# Patient Record
Sex: Female | Born: 1935
Health system: Southern US, Community
[De-identification: ages and names within clinical notes are randomized; demographics above are authoritative.]

## PROBLEM LIST (undated history)

## (undated) DIAGNOSIS — I341 Nonrheumatic mitral (valve) prolapse: Secondary | ICD-10-CM

## (undated) DIAGNOSIS — J449 Chronic obstructive pulmonary disease, unspecified: Secondary | ICD-10-CM

## (undated) DIAGNOSIS — Z5189 Encounter for other specified aftercare: Secondary | ICD-10-CM

## (undated) DIAGNOSIS — A048 Other specified bacterial intestinal infections: Secondary | ICD-10-CM

## (undated) DIAGNOSIS — I4891 Unspecified atrial fibrillation: Principal | ICD-10-CM

## (undated) DIAGNOSIS — M48061 Spinal stenosis, lumbar region without neurogenic claudication: Secondary | ICD-10-CM

## (undated) DIAGNOSIS — I471 Supraventricular tachycardia, unspecified: Secondary | ICD-10-CM

## (undated) DIAGNOSIS — D72819 Decreased white blood cell count, unspecified: Secondary | ICD-10-CM

## (undated) DIAGNOSIS — Z86010 Personal history of colonic polyps: Secondary | ICD-10-CM

## (undated) DIAGNOSIS — Z9289 Personal history of other medical treatment: Secondary | ICD-10-CM

## (undated) DIAGNOSIS — R7309 Other abnormal glucose: Secondary | ICD-10-CM

## (undated) DIAGNOSIS — K922 Gastrointestinal hemorrhage, unspecified: Secondary | ICD-10-CM

## (undated) DIAGNOSIS — IMO0001 Reserved for inherently not codable concepts without codable children: Secondary | ICD-10-CM

## (undated) DIAGNOSIS — M1990 Unspecified osteoarthritis, unspecified site: Secondary | ICD-10-CM

## (undated) DIAGNOSIS — D509 Iron deficiency anemia, unspecified: Secondary | ICD-10-CM

## (undated) DIAGNOSIS — I2510 Atherosclerotic heart disease of native coronary artery without angina pectoris: Secondary | ICD-10-CM

## (undated) DIAGNOSIS — I10 Essential (primary) hypertension: Secondary | ICD-10-CM

## (undated) DIAGNOSIS — K219 Gastro-esophageal reflux disease without esophagitis: Secondary | ICD-10-CM

## (undated) DIAGNOSIS — E785 Hyperlipidemia, unspecified: Secondary | ICD-10-CM

## (undated) DIAGNOSIS — I214 Non-ST elevation (NSTEMI) myocardial infarction: Secondary | ICD-10-CM

## (undated) DIAGNOSIS — M810 Age-related osteoporosis without current pathological fracture: Secondary | ICD-10-CM

## (undated) HISTORY — DX: Decreased white blood cell count, unspecified: D72.819

## (undated) HISTORY — PX: TUBAL LIGATION: SHX77

## (undated) HISTORY — PX: TONSILLECTOMY: SUR1361

## (undated) HISTORY — DX: Age-related osteoporosis without current pathological fracture: M81.0

## (undated) HISTORY — DX: Chronic obstructive pulmonary disease, unspecified: J44.9

## (undated) HISTORY — DX: Supraventricular tachycardia: I47.1

## (undated) HISTORY — DX: Iron deficiency anemia, unspecified: D50.9

## (undated) HISTORY — DX: Spinal stenosis, lumbar region without neurogenic claudication: M48.061

## (undated) HISTORY — DX: Personal history of colonic polyps: Z86.010

## (undated) HISTORY — PX: OTHER SURGICAL HISTORY: SHX169

## (undated) HISTORY — DX: Essential (primary) hypertension: I10

## (undated) HISTORY — DX: Nonrheumatic mitral (valve) prolapse: I34.1

## (undated) HISTORY — DX: Other specified bacterial intestinal infections: A04.8

## (undated) HISTORY — DX: Hyperlipidemia, unspecified: E78.5

## (undated) HISTORY — PX: CATARACT EXTRACTION: SUR2

## (undated) HISTORY — PX: ABDOMINAL HYSTERECTOMY: SHX81

## (undated) HISTORY — DX: Other abnormal glucose: R73.09

## (undated) HISTORY — DX: Encounter for other specified aftercare: Z51.89

## (undated) HISTORY — DX: Reserved for inherently not codable concepts without codable children: IMO0001

## (undated) HISTORY — DX: Supraventricular tachycardia, unspecified: I47.10

## (undated) HISTORY — DX: Unspecified osteoarthritis, unspecified site: M19.90

---

## 1990-03-31 HISTORY — PX: CARDIAC CATHETERIZATION: SHX172

## 1996-10-29 DIAGNOSIS — A048 Other specified bacterial intestinal infections: Secondary | ICD-10-CM

## 1996-10-29 HISTORY — DX: Other specified bacterial intestinal infections: A04.8

## 1998-04-18 ENCOUNTER — Emergency Department (HOSPITAL_COMMUNITY): Admission: EM | Admit: 1998-04-18 | Discharge: 1998-04-18 | Payer: Self-pay | Admitting: *Deleted

## 1998-05-10 ENCOUNTER — Ambulatory Visit (HOSPITAL_COMMUNITY): Admission: RE | Admit: 1998-05-10 | Discharge: 1998-05-10 | Payer: Self-pay | Admitting: Endocrinology

## 1998-05-10 ENCOUNTER — Encounter: Payer: Self-pay | Admitting: Endocrinology

## 1999-04-25 ENCOUNTER — Other Ambulatory Visit: Admission: RE | Admit: 1999-04-25 | Discharge: 1999-04-25 | Payer: Self-pay | Admitting: Obstetrics and Gynecology

## 2000-02-11 ENCOUNTER — Other Ambulatory Visit: Admission: RE | Admit: 2000-02-11 | Discharge: 2000-02-11 | Payer: Self-pay | Admitting: Obstetrics and Gynecology

## 2000-02-24 ENCOUNTER — Encounter: Payer: Self-pay | Admitting: Obstetrics and Gynecology

## 2000-02-24 ENCOUNTER — Encounter: Admission: RE | Admit: 2000-02-24 | Discharge: 2000-02-24 | Payer: Self-pay | Admitting: Obstetrics and Gynecology

## 2000-12-18 ENCOUNTER — Encounter: Payer: Self-pay | Admitting: Emergency Medicine

## 2000-12-18 ENCOUNTER — Emergency Department (HOSPITAL_COMMUNITY): Admission: EM | Admit: 2000-12-18 | Discharge: 2000-12-18 | Payer: Self-pay | Admitting: Emergency Medicine

## 2001-03-03 ENCOUNTER — Other Ambulatory Visit: Admission: RE | Admit: 2001-03-03 | Discharge: 2001-03-03 | Payer: Self-pay | Admitting: Obstetrics and Gynecology

## 2001-03-16 ENCOUNTER — Encounter: Admission: RE | Admit: 2001-03-16 | Discharge: 2001-03-16 | Payer: Self-pay | Admitting: Obstetrics and Gynecology

## 2001-03-16 ENCOUNTER — Encounter: Payer: Self-pay | Admitting: Obstetrics and Gynecology

## 2001-07-29 ENCOUNTER — Ambulatory Visit (HOSPITAL_COMMUNITY): Admission: RE | Admit: 2001-07-29 | Discharge: 2001-07-29 | Payer: Self-pay | Admitting: Endocrinology

## 2001-07-29 ENCOUNTER — Encounter: Payer: Self-pay | Admitting: Endocrinology

## 2002-03-10 ENCOUNTER — Encounter: Payer: Self-pay | Admitting: Obstetrics and Gynecology

## 2002-03-10 ENCOUNTER — Encounter: Admission: RE | Admit: 2002-03-10 | Discharge: 2002-03-10 | Payer: Self-pay | Admitting: Obstetrics and Gynecology

## 2002-03-21 ENCOUNTER — Encounter: Payer: Self-pay | Admitting: Obstetrics and Gynecology

## 2002-03-21 ENCOUNTER — Encounter: Admission: RE | Admit: 2002-03-21 | Discharge: 2002-03-21 | Payer: Self-pay | Admitting: Obstetrics and Gynecology

## 2003-03-01 LAB — HM DEXA SCAN

## 2003-04-10 ENCOUNTER — Encounter: Admission: RE | Admit: 2003-04-10 | Discharge: 2003-04-10 | Payer: Self-pay | Admitting: Obstetrics and Gynecology

## 2003-05-24 ENCOUNTER — Emergency Department (HOSPITAL_COMMUNITY): Admission: EM | Admit: 2003-05-24 | Discharge: 2003-05-24 | Payer: Self-pay | Admitting: Emergency Medicine

## 2003-08-29 HISTORY — PX: OTHER SURGICAL HISTORY: SHX169

## 2004-02-02 ENCOUNTER — Ambulatory Visit: Payer: Self-pay | Admitting: Gastroenterology

## 2004-02-02 LAB — HM COLONOSCOPY

## 2004-02-27 ENCOUNTER — Ambulatory Visit: Payer: Self-pay | Admitting: Endocrinology

## 2004-03-07 ENCOUNTER — Ambulatory Visit: Payer: Self-pay | Admitting: Endocrinology

## 2004-07-02 ENCOUNTER — Encounter: Admission: RE | Admit: 2004-07-02 | Discharge: 2004-07-02 | Payer: Self-pay | Admitting: Obstetrics and Gynecology

## 2004-07-23 ENCOUNTER — Other Ambulatory Visit: Admission: RE | Admit: 2004-07-23 | Discharge: 2004-07-23 | Payer: Self-pay | Admitting: Obstetrics and Gynecology

## 2004-07-24 ENCOUNTER — Ambulatory Visit: Payer: Self-pay | Admitting: Endocrinology

## 2004-08-14 ENCOUNTER — Emergency Department (HOSPITAL_COMMUNITY): Admission: EM | Admit: 2004-08-14 | Discharge: 2004-08-14 | Payer: Self-pay | Admitting: Emergency Medicine

## 2004-08-15 ENCOUNTER — Ambulatory Visit: Payer: Self-pay | Admitting: Endocrinology

## 2004-08-28 ENCOUNTER — Ambulatory Visit: Payer: Self-pay

## 2004-09-17 ENCOUNTER — Ambulatory Visit: Payer: Self-pay | Admitting: Endocrinology

## 2005-04-23 ENCOUNTER — Ambulatory Visit: Payer: Self-pay | Admitting: Endocrinology

## 2005-04-30 ENCOUNTER — Ambulatory Visit: Payer: Self-pay | Admitting: Endocrinology

## 2005-05-26 ENCOUNTER — Ambulatory Visit: Payer: Self-pay | Admitting: Endocrinology

## 2005-07-03 ENCOUNTER — Encounter: Admission: RE | Admit: 2005-07-03 | Discharge: 2005-07-03 | Payer: Self-pay | Admitting: Obstetrics and Gynecology

## 2005-11-21 ENCOUNTER — Ambulatory Visit: Payer: Self-pay | Admitting: Endocrinology

## 2005-12-25 ENCOUNTER — Ambulatory Visit: Payer: Self-pay | Admitting: Internal Medicine

## 2005-12-31 ENCOUNTER — Ambulatory Visit: Payer: Self-pay | Admitting: Internal Medicine

## 2006-01-12 ENCOUNTER — Ambulatory Visit: Payer: Self-pay | Admitting: Endocrinology

## 2006-06-04 ENCOUNTER — Ambulatory Visit: Payer: Self-pay | Admitting: Endocrinology

## 2006-06-04 LAB — CONVERTED CEMR LAB
ALT: 15 units/L (ref 0–40)
Albumin: 4.3 g/dL (ref 3.5–5.2)
BUN: 8 mg/dL (ref 6–23)
Bacteria, UA: NEGATIVE
Basophils Absolute: 0 10*3/uL (ref 0.0–0.1)
Basophils Relative: 0.8 % (ref 0.0–1.0)
Bilirubin Urine: NEGATIVE
Cholesterol: 306 mg/dL (ref 0–200)
Creatinine, Ser: 0.9 mg/dL (ref 0.4–1.2)
Eosinophils Absolute: 0.2 10*3/uL (ref 0.0–0.6)
Eosinophils Relative: 5.8 % — ABNORMAL HIGH (ref 0.0–5.0)
GFR calc Af Amer: 80 mL/min
Glucose, Bld: 104 mg/dL — ABNORMAL HIGH (ref 70–99)
HDL: 60.2 mg/dL (ref >39.0)
Iron: 71 ug/dL (ref 42–145)
MCV: 97 fL (ref 78.0–100.0)
Monocytes Absolute: 0.4 10*3/uL (ref 0.2–0.7)
Mucus, UA: NEGATIVE
Neutrophils Relative %: 31.5 % — ABNORMAL LOW (ref 43.0–77.0)
Nitrite: NEGATIVE
Platelets: 267 10*3/uL (ref 150–400)
RBC: 3.87 M/uL (ref 3.87–5.11)
RDW: 11.6 % (ref 11.5–14.6)
Specific Gravity, Urine: 1.01 (ref 1.000–1.03)
Total Bilirubin: 0.9 mg/dL (ref 0.3–1.2)
Total Protein: 7.3 g/dL (ref 6.0–8.3)
Triglycerides: 126 mg/dL (ref 0–149)
Urobilinogen, UA: 0.2 (ref 0.0–1.0)
WBC: 3.6 10*3/uL — ABNORMAL LOW (ref 4.5–10.5)

## 2006-07-08 ENCOUNTER — Encounter: Admission: RE | Admit: 2006-07-08 | Discharge: 2006-07-08 | Payer: Self-pay | Admitting: Obstetrics and Gynecology

## 2006-11-12 ENCOUNTER — Encounter: Payer: Self-pay | Admitting: Endocrinology

## 2006-11-12 DIAGNOSIS — I10 Essential (primary) hypertension: Secondary | ICD-10-CM | POA: Insufficient documentation

## 2006-11-12 DIAGNOSIS — M810 Age-related osteoporosis without current pathological fracture: Secondary | ICD-10-CM | POA: Insufficient documentation

## 2006-11-12 HISTORY — DX: Essential (primary) hypertension: I10

## 2006-11-12 HISTORY — DX: Age-related osteoporosis without current pathological fracture: M81.0

## 2006-11-13 ENCOUNTER — Ambulatory Visit: Payer: Self-pay | Admitting: Endocrinology

## 2006-11-26 ENCOUNTER — Ambulatory Visit: Payer: Self-pay | Admitting: Internal Medicine

## 2006-12-23 ENCOUNTER — Ambulatory Visit: Payer: Self-pay | Admitting: Internal Medicine

## 2007-01-18 DIAGNOSIS — J45909 Unspecified asthma, uncomplicated: Secondary | ICD-10-CM

## 2007-01-18 DIAGNOSIS — R05 Cough: Secondary | ICD-10-CM

## 2007-03-08 ENCOUNTER — Telehealth (INDEPENDENT_AMBULATORY_CARE_PROVIDER_SITE_OTHER): Payer: Self-pay | Admitting: *Deleted

## 2007-03-09 ENCOUNTER — Ambulatory Visit: Payer: Self-pay | Admitting: Internal Medicine

## 2007-03-09 DIAGNOSIS — Z860100 Personal history of colon polyps, unspecified: Secondary | ICD-10-CM

## 2007-03-09 DIAGNOSIS — J449 Chronic obstructive pulmonary disease, unspecified: Secondary | ICD-10-CM

## 2007-03-09 DIAGNOSIS — I059 Rheumatic mitral valve disease, unspecified: Secondary | ICD-10-CM | POA: Insufficient documentation

## 2007-03-09 DIAGNOSIS — J019 Acute sinusitis, unspecified: Secondary | ICD-10-CM

## 2007-03-09 DIAGNOSIS — D509 Iron deficiency anemia, unspecified: Secondary | ICD-10-CM | POA: Insufficient documentation

## 2007-03-09 DIAGNOSIS — J4489 Other specified chronic obstructive pulmonary disease: Secondary | ICD-10-CM

## 2007-03-09 DIAGNOSIS — J309 Allergic rhinitis, unspecified: Secondary | ICD-10-CM

## 2007-03-09 DIAGNOSIS — E785 Hyperlipidemia, unspecified: Secondary | ICD-10-CM | POA: Insufficient documentation

## 2007-03-09 HISTORY — DX: Chronic obstructive pulmonary disease, unspecified: J44.9

## 2007-03-09 HISTORY — DX: Other specified chronic obstructive pulmonary disease: J44.89

## 2007-03-09 HISTORY — DX: Personal history of colon polyps, unspecified: Z86.0100

## 2007-03-09 HISTORY — DX: Personal history of colonic polyps: Z86.010

## 2007-05-12 ENCOUNTER — Ambulatory Visit: Payer: Self-pay | Admitting: Internal Medicine

## 2007-05-12 DIAGNOSIS — H0500 Unspecified acute inflammation of orbit: Secondary | ICD-10-CM

## 2007-06-15 ENCOUNTER — Telehealth (INDEPENDENT_AMBULATORY_CARE_PROVIDER_SITE_OTHER): Payer: Self-pay | Admitting: *Deleted

## 2007-07-02 ENCOUNTER — Ambulatory Visit: Payer: Self-pay | Admitting: Endocrinology

## 2007-07-02 DIAGNOSIS — R21 Rash and other nonspecific skin eruption: Secondary | ICD-10-CM

## 2007-07-02 DIAGNOSIS — R7309 Other abnormal glucose: Secondary | ICD-10-CM

## 2007-07-02 DIAGNOSIS — R739 Hyperglycemia, unspecified: Secondary | ICD-10-CM

## 2007-07-02 HISTORY — DX: Other abnormal glucose: R73.09

## 2007-07-13 ENCOUNTER — Telehealth: Payer: Self-pay | Admitting: Internal Medicine

## 2007-07-14 ENCOUNTER — Ambulatory Visit: Payer: Self-pay | Admitting: Internal Medicine

## 2007-07-16 LAB — CONVERTED CEMR LAB
Direct LDL: 204.6 mg/dL
HDL: 70.9 mg/dL (ref >39.0)
VLDL: 15 mg/dL (ref 0–40)

## 2007-07-20 ENCOUNTER — Encounter: Admission: RE | Admit: 2007-07-20 | Discharge: 2007-07-20 | Payer: Self-pay | Admitting: Obstetrics and Gynecology

## 2008-01-07 ENCOUNTER — Ambulatory Visit: Payer: Self-pay | Admitting: Endocrinology

## 2008-01-11 ENCOUNTER — Telehealth (INDEPENDENT_AMBULATORY_CARE_PROVIDER_SITE_OTHER): Payer: Self-pay | Admitting: *Deleted

## 2008-02-07 ENCOUNTER — Ambulatory Visit: Payer: Self-pay | Admitting: Endocrinology

## 2008-02-29 ENCOUNTER — Telehealth (INDEPENDENT_AMBULATORY_CARE_PROVIDER_SITE_OTHER): Payer: Self-pay | Admitting: *Deleted

## 2008-05-02 ENCOUNTER — Encounter: Payer: Self-pay | Admitting: Endocrinology

## 2008-05-25 ENCOUNTER — Encounter: Payer: Self-pay | Admitting: Endocrinology

## 2008-05-30 ENCOUNTER — Ambulatory Visit: Payer: Self-pay | Admitting: Endocrinology

## 2008-05-30 DIAGNOSIS — D72819 Decreased white blood cell count, unspecified: Secondary | ICD-10-CM | POA: Insufficient documentation

## 2008-05-30 HISTORY — DX: Decreased white blood cell count, unspecified: D72.819

## 2008-07-17 ENCOUNTER — Ambulatory Visit: Payer: Self-pay | Admitting: Endocrinology

## 2008-07-17 DIAGNOSIS — Z780 Asymptomatic menopausal state: Secondary | ICD-10-CM | POA: Insufficient documentation

## 2008-07-18 ENCOUNTER — Ambulatory Visit: Payer: Self-pay | Admitting: Internal Medicine

## 2008-07-18 ENCOUNTER — Encounter: Payer: Self-pay | Admitting: Endocrinology

## 2008-08-11 ENCOUNTER — Ambulatory Visit: Payer: Self-pay | Admitting: Endocrinology

## 2008-08-23 ENCOUNTER — Ambulatory Visit: Payer: Self-pay | Admitting: Internal Medicine

## 2009-01-24 ENCOUNTER — Encounter (INDEPENDENT_AMBULATORY_CARE_PROVIDER_SITE_OTHER): Payer: Self-pay | Admitting: *Deleted

## 2009-02-05 ENCOUNTER — Ambulatory Visit: Payer: Self-pay | Admitting: Endocrinology

## 2009-02-19 ENCOUNTER — Ambulatory Visit: Payer: Self-pay | Admitting: Endocrinology

## 2009-02-19 DIAGNOSIS — M1990 Unspecified osteoarthritis, unspecified site: Secondary | ICD-10-CM

## 2009-02-19 HISTORY — DX: Unspecified osteoarthritis, unspecified site: M19.90

## 2009-02-20 LAB — CONVERTED CEMR LAB
AST: 23 units/L (ref 0–37)
Albumin: 4.3 g/dL (ref 3.5–5.2)
Alkaline Phosphatase: 54 units/L (ref 39–117)
Bilirubin, Direct: 0.1 mg/dL (ref 0.0–0.3)
Total Protein: 7 g/dL (ref 6.0–8.3)

## 2009-03-19 ENCOUNTER — Ambulatory Visit: Payer: Self-pay | Admitting: Endocrinology

## 2009-04-03 ENCOUNTER — Telehealth: Payer: Self-pay | Admitting: Endocrinology

## 2009-05-02 ENCOUNTER — Encounter: Payer: Self-pay | Admitting: Endocrinology

## 2009-05-03 ENCOUNTER — Ambulatory Visit: Payer: Self-pay | Admitting: Cardiovascular Disease

## 2009-05-03 DIAGNOSIS — I471 Supraventricular tachycardia: Secondary | ICD-10-CM

## 2009-05-18 ENCOUNTER — Telehealth: Payer: Self-pay | Admitting: Endocrinology

## 2009-05-21 ENCOUNTER — Ambulatory Visit: Payer: Self-pay

## 2009-05-21 ENCOUNTER — Ambulatory Visit: Payer: Self-pay | Admitting: Cardiovascular Disease

## 2009-05-21 ENCOUNTER — Encounter: Payer: Self-pay | Admitting: Cardiovascular Disease

## 2009-05-21 ENCOUNTER — Ambulatory Visit (HOSPITAL_COMMUNITY)
Admission: RE | Admit: 2009-05-21 | Discharge: 2009-05-21 | Payer: Self-pay | Source: Home / Self Care | Admitting: Cardiovascular Disease

## 2009-09-17 ENCOUNTER — Ambulatory Visit: Payer: Self-pay | Admitting: Endocrinology

## 2009-09-17 DIAGNOSIS — E7800 Pure hypercholesterolemia, unspecified: Secondary | ICD-10-CM

## 2009-09-17 LAB — CONVERTED CEMR LAB: VLDL: 17.8 mg/dL (ref 0.0–40.0)

## 2009-09-18 ENCOUNTER — Encounter: Payer: Self-pay | Admitting: Endocrinology

## 2009-09-18 LAB — CONVERTED CEMR LAB
Calcium, Total (PTH): 10 mg/dL (ref 8.4–10.5)
PTH: 46.7 pg/mL (ref 14.0–72.0)

## 2009-10-16 ENCOUNTER — Telehealth: Payer: Self-pay | Admitting: Gastroenterology

## 2009-11-20 ENCOUNTER — Ambulatory Visit: Payer: Self-pay | Admitting: Endocrinology

## 2009-11-20 LAB — CONVERTED CEMR LAB
BUN: 10 mg/dL (ref 6–23)
CO2: 33 meq/L — ABNORMAL HIGH (ref 19–32)
Calcium: 9.8 mg/dL (ref 8.4–10.5)
Creatinine, Ser: 0.8 mg/dL (ref 0.4–1.2)
Eosinophils Absolute: 0.2 10*3/uL (ref 0.0–0.7)
Eosinophils Relative: 5 % (ref 0.0–5.0)
GFR calc non Af Amer: 95.57 mL/min (ref >60)
Hemoglobin: 12.8 g/dL (ref 12.0–15.0)
Neutro Abs: 1.4 10*3/uL (ref 1.4–7.7)
Platelets: 204 10*3/uL (ref 150.0–400.0)
RBC: 3.64 M/uL — ABNORMAL LOW (ref 3.87–5.11)
Sodium: 145 meq/L (ref 135–145)
Transferrin: 308.3 mg/dL (ref 212.0–360.0)
WBC: 3.7 10*3/uL — ABNORMAL LOW (ref 4.5–10.5)

## 2010-01-10 ENCOUNTER — Ambulatory Visit: Payer: Self-pay | Admitting: Endocrinology

## 2010-01-10 DIAGNOSIS — S9030XA Contusion of unspecified foot, initial encounter: Secondary | ICD-10-CM | POA: Insufficient documentation

## 2010-01-11 ENCOUNTER — Telehealth: Payer: Self-pay | Admitting: Endocrinology

## 2010-01-14 ENCOUNTER — Telehealth: Payer: Self-pay | Admitting: Endocrinology

## 2010-02-05 ENCOUNTER — Telehealth: Payer: Self-pay | Admitting: Endocrinology

## 2010-02-18 ENCOUNTER — Ambulatory Visit: Payer: Self-pay | Admitting: Endocrinology

## 2010-04-28 LAB — CONVERTED CEMR LAB
Albumin: 4.1 g/dL (ref 3.5–5.2)
Albumin: 4.4 g/dL (ref 3.5–5.2)
Alkaline Phosphatase: 44 units/L (ref 39–117)
BUN: 11 mg/dL (ref 6–23)
Bacteria, UA: NEGATIVE
Bilirubin Urine: NEGATIVE
CO2: 31 meq/L (ref 19–32)
Calcium: 9.5 mg/dL (ref 8.4–10.5)
Chloride: 101 meq/L (ref 96–112)
Cholesterol: 300 mg/dL — ABNORMAL HIGH (ref 0–200)
Creatinine, Ser: 0.9 mg/dL (ref 0.4–1.2)
Crystals: NEGATIVE
Direct LDL: 177.7 mg/dL
GFR calc non Af Amer: 66 mL/min
Glucose, Bld: 99 mg/dL (ref 70–99)
Glucose, Bld: 99 mg/dL (ref 70–99)
HCT: 38.1 % (ref 36.0–46.0)
HDL: 92.6 mg/dL (ref >39.00)
Hgb A1c MFr Bld: 5.8 % (ref 4.6–6.5)
Ketones, ur: NEGATIVE mg/dL
Ketones, ur: NEGATIVE mg/dL
Leukocytes, UA: NEGATIVE
MCV: 99.8 fL (ref 78.0–100.0)
Neutrophils Relative %: 28.4 % — ABNORMAL LOW (ref 43.0–77.0)
Nitrite: NEGATIVE
Platelets: 231 10*3/uL (ref 150–400)
Potassium: 3.9 meq/L (ref 3.5–5.1)
RBC: 3.82 M/uL — ABNORMAL LOW (ref 3.87–5.11)
RDW: 12.1 % (ref 11.5–14.6)
Sodium: 142 meq/L (ref 135–145)
Specific Gravity, Urine: <=1.005 (ref 1.000–1.03)
Specific Gravity, Urine: <=1.005 (ref 1.000–1.030)
Total Bilirubin: 0.8 mg/dL (ref 0.3–1.2)
Total CHOL/HDL Ratio: 3
Urine Glucose: NEGATIVE mg/dL
Urine Glucose: NEGATIVE mg/dL
WBC: 4.1 10*3/uL — ABNORMAL LOW (ref 4.5–10.5)
pH: 6.5 (ref 5.0–8.0)
pH: 7.5 (ref 5.0–8.0)

## 2010-05-02 NOTE — Letter (Signed)
Summary: [place]   Imported By: Phillis Knack 05/23/2009 11:16:02  _____________________________________________________________________  External Attachment:    Type:   Image     Comment:   External Document

## 2010-05-02 NOTE — Progress Notes (Signed)
Summary: refill req  Phone Note Refill Request Message from:  Patient on January 11, 2010 10:32 AM  Refills Requested: Medication #1:  HYZAAR 50-12.5 MG  TABS take 1 by mouth qd   Dosage confirmed as above?Dosage Confirmed  Method Requested: Electronic Initial call taken by: Rebeca Alert MA,  January 11, 2010 10:32 AM    Prescriptions: HYZAAR 50-12.5 MG  TABS (LOSARTAN POTASSIUM-HCTZ) take 1 by mouth qd  #30 Tablet x 4   Entered by:   Rebeca Alert MA   Authorized by:   Donavan Foil MD   Signed by:   Rebeca Alert MA on 01/11/2010   Method used:   Electronically to        [facility]. [id number]* (retail)       [address].       [place], [place]  [address]       Ph: [phone] or [phone]       Fax: [phone]   RxID:   [id number]

## 2010-05-02 NOTE — Progress Notes (Signed)
Summary: Schedule Colonoscopy  Phone Note Outgoing Call Call back at Home Phone [phone]   Call placed by: Bernita Buffy CMA [facility]),  October 16, 2009 9:44 AM Call placed to: Patient Summary of Call: spoke to patient husband and gave him all of the information about the colonoscopy and he will have his wife call back to schedule her colonoscopy when she gets home later today.  Initial call taken by: Bernita Buffy CMA [facility]),  October 16, 2009 9:48 AM

## 2010-05-02 NOTE — Assessment & Plan Note (Signed)
Summary: np3/MVP/jml  notes in emr    Visit Type:  Follow-up  CC:  Mitral Valve Prolapse.  History of Present Illness: Lindsay Doyle is seen today at the request of Dr Loanne Drilling for MVP, history of SVT and HTN.  She is originally from [place].  She has apparantly had a history of SVT although the details are unknown.  One episode was in the setting of profound anemia from "female problems".  She denies current palpitations.  She has a long standing history of MVP ans says her last echo was 3-5 years ago in [place].  She was upset when her dentist didn't advise SBE prophylaxis.  I informed her of the new guidelines and assured her that the dentist was doing the correct thing.  She denies dyspnea, edema, SSCP or syncope  She has been compliant wtih her BP and cholesterol meds.  She is active.  She doens't smoke.  Current Problems (verified): 1)  Hyperlipidemia  (ICD-272.4) 2)  Mitral Valve Prolapse  (ICD-424.0) 3)  Hypertension  (ICD-401.9) 4)  Hyperglycemia  (ICD-790.29) 5)  Routine General Medical Exam@health  Care Facl  (ICD-V70.0) 6)  Unspecified Acute Inflammation of Orbit  (ICD-376.00) 7)  Sinusitis- Acute-nos  (ICD-461.9) 8)  Colonic Polyps, Hx of  (ICD-V12.72) 9)  Anemia-iron Deficiency  (ICD-280.9) 10)  Allergic Rhinitis  (ICD-477.9) 11)  COPD  (ICD-496) 12)  Hx of Cough  (ICD-786.2) 13)  Asthma  (ICD-493.90) 14)  Osteoporosis  (ICD-733.00) 15)  Osteoarthritis  (ICD-715.90) 16)  Asymptomatic Postmenopausal Status  (ICD-V49.81) 17)  Hepatotoxicity, Drug-induced, Risk of  (ICD-V58.69) 18)  Leukopenia, Mild  (ICD-288.50) 19)  Hip Pain, Left  (ICD-719.45) 20)  Skin Rash  (ICD-782.1)  Current Medications (verified): 1)  Multivitamins   Tabs (Multiple Vitamin) .... Take 1 By Mouth Qd 2)  Oscal 500/200 D-3 500-200 Mg-Unit  Tabs (Calcium-Vitamin D) .[name].. 1 By Mouth Tid 3)  B Complex   Tabs (B Complex Vitamins) .... Take 1 By Mouth Qd 4)  Hyzaar 50-12.5 Mg  Tabs (Losartan Potassium-Hctz) ....  Take 1 By Mouth Qd 5)  Metoprolol Tartrate 25 Mg  Tabs (Metoprolol Tartrate) .... Take 1 By Mouth Qd 6)  Cvs Natural Fish Oil 1000 Mg Caps (Omega-3 Fatty Acids) .[name].. 1 Capsule Daily  Allergies: 1)  * Vioxx 2)  Biaxin 3)  Augmentin 4)  * Tussionex 5)  Zithromax 6)  Ceftin  Past History:  Past Medical History: Last updated: 03/09/2007 Hypertension Osteoporosis Positive H.Pylori (10/1996) Decrease WBC, Chronic Spinal OA Dyslipidemia ( Rx refused) Hyperglycemia Asthma COPD Allergic rhinitis Mitral valve prolapse Hyperlipidemia Anemia-iron deficiency Colonic polyps, hx of  Past Surgical History: Last updated: 03/09/2007 Hysterectomy Tonsillectomy Carotid duplex (08/29/2003) EKG (06/04/2006) DEXA (03/2003) Tubal ligation Cataract extraction  Family History: Last updated: 03/09/2007 sister and neice with sarcoidosis mother with stomach cancer  brother with colon cancer brother with NHL  Social History: Last updated: 07/02/2007 Never Smoked Alcohol use-no retired  married  Review of Systems       Denies fever, malais, weight loss, blurry vision, decreased visual acuity, cough, sputum, SOB, hemoptysis, pleuritic pain, palpitaitons, heartburn, abdominal pain, melena, lower extremity edema, claudication, or rash. All other systems reviewed and negative  Vital Signs:  Patient profile:   77 year old female Height:      64 inches Weight:      106.50 pounds BMI:     18.35 Pulse rate:   84 / minute Pulse rhythm:   regular Resp:     18 per minute  BP supine:   110 / 49  Vitals Entered By: Sidney Ace (May 03, 2009 2:34 PM)  Physical Exam  General:  Affect appropriate Healthy:  appears stated age 18: normal Neck supple with no adenopathy JVP normal no bruits no thyromegaly Lungs clear with no wheezing and good diaphragmatic motion Heart:  S1/S2 no murmur,rub, gallop or click PMI normal Abdomen: benighn, BS positve, no tenderness, no AAA no bruit.   No HSM or HJR Distal pulses intact with no bruits No edema Neuro non-focal Skin warm and dry    Impression & Recommendations:  Problem # 1:  HYPERLIPIDEMIA (ICD-272.4) F/U with Loanne Drilling  ? previous Rx.  Target LDL less than 130 CHOL: 300 (07/17/2008)   LDL: DEL (07/14/2007)   HDL: 92.60 (07/17/2008)   TG: 91.0 (07/17/2008)  Problem # 2:  MITRAL VALVE PROLAPSE (ICD-424.0) Split first heart sound on exam.  No significant murmur.  Long standing history Echo Her updated medication list for this problem includes:    Hyzaar 50-12.5 Mg Tabs (Losartan potassium-hctz) .[name]... Take 1 by mouth qd    Metoprolol Tartrate 25 Mg Tabs (Metoprolol tartrate) .[name]... Take 1 by mouth qd  Orders: EKG w/ Interpretation (93000) Echocardiogram (Echo)  Problem # 3:  HYPERTENSION (ICD-401.9) Well cotnrolled Continue low soidum diet Her updated medication list for this problem includes:    Hyzaar 50-12.5 Mg Tabs (Losartan potassium-hctz) .[name]... Take 1 by mouth qd    Metoprolol Tartrate 25 Mg Tabs (Metoprolol tartrate) .[name]... Take 1 by mouth qd  Problem # 4:  PSVT (ICD-427.0) In seting of anemia.  Non recurrent continue low dose BB in light of HTN Her updated medication list for this problem includes:    Metoprolol Tartrate 25 Mg Tabs (Metoprolol tartrate) .[name]... Take 1 by mouth qd  Patient Instructions: 1)  Your physician recommends that you schedule a follow-up appointment as needed 2)  Your physician has requested that you have an echocardiogram.  Echocardiography is a painless test that uses sound waves to create images of your heart. It provides your doctor with information about the size and shape of your heart and how well your heart's chambers and valves are working.  This procedure takes approximately one hour. There are no restrictions for this procedure.   EKG Report  Procedure date:  05/03/2009  Findings:      NSR 84 Normal ECG

## 2010-05-02 NOTE — Progress Notes (Signed)
Summary: referral  Phone Note Call from Patient Call back at Home Phone [phone]   Caller: Patient Summary of Call: pt called requesting referral to [facility] cardiologist for MVP Initial call taken by: Crissie Sickles, [place],  April 03, 2009 10:26 AM  Follow-up for Phone Call        done Follow-up by: Donavan Foil MD,  April 03, 2009 11:41 AM  Additional Follow-up for Phone Call Additional follow up Details #1::        pt informed and told to expect call from [facility] with appt info Additional Follow-up by: Crissie Sickles, [place],  April 03, 2009 11:43 AM

## 2010-05-02 NOTE — Assessment & Plan Note (Signed)
Summary: BROKE LEFT LITTLE TOE AND STILL IN PAIN-LB   Vital Signs:  Patient profile:   77 year old female Height:      64 inches (162.56 cm) Weight:      106 pounds (48.18 kg) BMI:     18.26 O2 Sat:      98 % on Room air Temp:     97.6 degrees F (36.44 degrees C) oral Pulse rate:   85 / minute BP sitting:   122 / 82  (left arm) Cuff size:   regular  Vitals Entered By: Rebeca Alert MA (January 10, 2010 9:14 AM)  O2 Flow:  Room air CC: pt states she broke left little toe/aj Is Patient Diabetic? Yes   CC:  pt states she broke left little toe/aj.  History of Present Illness: 10 days ago, she struck her left foot on a door frame.  since then, she has 10 days of pain at the left distal foot, and adjacent toes, but no assoc numbness.  Current Medications (verified): 1)  Multivitamins   Tabs (Multiple Vitamin) .... Take 1 By Mouth Qd 2)  Oscal 500/200 D-3 500-200 Mg-Unit  Tabs (Calcium-Vitamin D) .[name].. 1 By Mouth Tid 3)  B Complex   Tabs (B Complex Vitamins) .... Take 1 By Mouth Qd 4)  Hyzaar 50-12.5 Mg  Tabs (Losartan Potassium-Hctz) .... Take 1 By Mouth Qd 5)  Cvs Natural Fish Oil 1000 Mg Caps (Omega-3 Fatty Acids) .[name].. 1 Capsule Daily 6)  Fluticasone Propionate 0.05 % Crea (Fluticasone Propionate) .... 4x A Day As Needed For Rash. 7)  Metoprolol Succinate 25 Mg Xr24h-Tab (Metoprolol Succinate) .[name].. 1 Tab Once Daily  Allergies (verified): 1)  * Vioxx 2)  Biaxin 3)  Augmentin 4)  * Tussionex 5)  Zithromax 6)  Ceftin  Past History:  Past Medical History: Last updated: 05/02/2009 Current Problems:  HYPERLIPIDEMIA (ICD-272.4) MITRAL VALVE PROLAPSE (ICD-424.0) HYPERTENSION (ICD-401.9) HYPERGLYCEMIA (ICD-790.29) ROUTINE GENERAL MEDICAL EXAM@HEALTH  CARE FACL (ICD-V70.0) UNSPECIFIED ACUTE INFLAMMATION OF ORBIT (ICD-376.00) SINUSITIS- ACUTE-NOS (ICD-461.9) COLONIC POLYPS, HX OF (ICD-V12.72) ANEMIA-IRON DEFICIENCY (ICD-280.9) ALLERGIC RHINITIS (ICD-477.9) COPD  (ICD-496) Hx of COUGH (ICD-786.2) ASTHMA (ICD-493.90) OSTEOPOROSIS (ICD-733.00) OSTEOARTHRITIS (ICD-715.90) ASYMPTOMATIC POSTMENOPAUSAL STATUS (ICD-V49.81) HEPATOTOXICITY, DRUG-INDUCED, RISK OF (ICD-V58.69) LEUKOPENIA, MILD (ICD-288.50) HIP PAIN, LEFT (ICD-719.45) SKIN RASH (ICD-782.1) Positive H.Pylori (10/1996) Decrease WBC, Chronic Spinal OA Dyslipidemia ( Rx refused) Mitral valve prolapse  Review of Systems  The patient denies syncope.         denies dizziness  Physical Exam  General:  normal appearance.   Msk:  gait:  slightly favors left foot Extremities:  left foot: there is tenderness at the 4th and 5th toes, and at adjacent area of the foot. Additional Exam:  LEFT FOOT - COMPLETE 3+ VIEW 01/10/2010: 1.  Fractures involving the base of the proximal phalanges of the fourth and fifth toes. 2.  Possible gout involving the first MTP joint. 3.  Osteoarthritis involving multiple IP joints and the midfoot. 4.  Mild osteopenia.   Impression & Recommendations:  Problem # 1:  CONTUSION, LEFT FOOT (ICD-924.20) Assessment New with several toe fxs  Problem # 2:  gout uncertain dx  Problem # 3:  OSTEOPOROSIS (ICD-733.00) Assessment: Unchanged  Other Orders: T-Foot Left Min 3 Views (73630TC) Est. Patient Level IV [id number])  Patient Instructions: 1)  take vimovo 500/20, 1 per day.  here are some samples. 2)  check x ray of your left foot.  please call [phone] to hear your test results. 3)  call in  a few days if not better. 4)  (update: i left message on phone-tree:  rx as we discussed.  we'll address gout at a future ov).

## 2010-05-02 NOTE — Assessment & Plan Note (Signed)
Summary: FU/NWS   Vital Signs:  Patient profile:   77 year old female Height:      64 inches (162.56 cm) Weight:      106 pounds (48.18 kg) BMI:     18.26 O2 Sat:      99 % on Room air Temp:     97.1 degrees F (36.17 degrees C) oral Pulse rate:   88 / minute BP sitting:   130 / 88  (left arm) Cuff size:   regular  Vitals Entered By: Rebeca Alert MA (November 20, 2009 10:11 AM)  O2 Flow:  Room air CC: F/U appt/discuss BP, lab work/aj   CC:  F/U appt/discuss BP and lab work/aj.  History of Present Illness: pt says she has lightheadedness a few hrs after taking toprol.  she brings a record of her bp's which i have reviewed today.  it is 99991111 systolic.     Current Medications (verified): 1)  Multivitamins   Tabs (Multiple Vitamin) .... Take 1 By Mouth Qd 2)  Oscal 500/200 D-3 500-200 Mg-Unit  Tabs (Calcium-Vitamin D) .[name].. 1 By Mouth Tid 3)  B Complex   Tabs (B Complex Vitamins) .... Take 1 By Mouth Qd 4)  Hyzaar 50-12.5 Mg  Tabs (Losartan Potassium-Hctz) .... Take 1 By Mouth Qd 5)  Metoprolol Tartrate 25 Mg  Tabs (Metoprolol Tartrate) .... Take 1 By Mouth Qd 6)  Cvs Natural Fish Oil 1000 Mg Caps (Omega-3 Fatty Acids) .[name].. 1 Capsule Daily 7)  Fluticasone Propionate 0.05 % Crea (Fluticasone Propionate) .... 4x A Day As Needed For Rash.  Allergies (verified): 1)  * Vioxx 2)  Biaxin 3)  Augmentin 4)  * Tussionex 5)  Zithromax 6)  Ceftin  Past History:  Past Medical History: Last updated: 05/02/2009 Current Problems:  HYPERLIPIDEMIA (ICD-272.4) MITRAL VALVE PROLAPSE (ICD-424.0) HYPERTENSION (ICD-401.9) HYPERGLYCEMIA (ICD-790.29) ROUTINE GENERAL MEDICAL EXAM@HEALTH  CARE FACL (ICD-V70.0) UNSPECIFIED ACUTE INFLAMMATION OF ORBIT (ICD-376.00) SINUSITIS- ACUTE-NOS (ICD-461.9) COLONIC POLYPS, HX OF (ICD-V12.72) ANEMIA-IRON DEFICIENCY (ICD-280.9) ALLERGIC RHINITIS (ICD-477.9) COPD (ICD-496) Hx of COUGH (ICD-786.2) ASTHMA (ICD-493.90) OSTEOPOROSIS  (ICD-733.00) OSTEOARTHRITIS (ICD-715.90) ASYMPTOMATIC POSTMENOPAUSAL STATUS (ICD-V49.81) HEPATOTOXICITY, DRUG-INDUCED, RISK OF (ICD-V58.69) LEUKOPENIA, MILD (ICD-288.50) HIP PAIN, LEFT (ICD-719.45) SKIN RASH (ICD-782.1) Positive H.Pylori (10/1996) Decrease WBC, Chronic Spinal OA Dyslipidemia ( Rx refused) Mitral valve prolapse  Review of Systems  The patient denies syncope.    Physical Exam  General:  normal appearance.   Msk:  gait is normal and steady    Impression & Recommendations:  Problem # 1:  HYPERTENSION (ICD-401.9) overcontrolled  Medications Added to Medication List This Visit: 1)  Metoprolol Succinate 25 Mg Xr24h-tab (Metoprolol succinate) .[name].. 1 tab once daily  Other Orders: TLB-CBC Platelet - w/Differential (85025-CBCD) TLB-BMP (Basic Metabolic Panel-BMET) (99991111) TLB-IBC Pnl (Iron/FE;Transferrin) (83550-IBC) Est. Patient Level III [id number])  Patient Instructions: 1)  change metoprolol to extended-release 25 mg once daily. 2)  blood tests are being ordered for you today.  please call [phone] to hear your test results. 3)  i'll fax results to dr Helane Rima. Prescriptions: METOPROLOL SUCCINATE 25 MG XR24H-TAB (METOPROLOL SUCCINATE) 1 tab once daily  #90 x 3   Entered and Authorized by:   Donavan Foil MD   Signed by:   Donavan Foil MD on 11/20/2009   Method used:   Electronically to        [facility]. [id number]* (retail)       [place].       [facility]  [place], [place]  [address]       Ph: [phone] or [phone]       Fax: [phone]   RxID:   [id number]

## 2010-05-02 NOTE — Progress Notes (Signed)
Summary: HCTZ?  Phone Note Call from Patient Call back at Home Phone [phone]   Caller: Patient [phone] Summary of Call: Pt called stating that she did some research over the weekend about Gout and read that Diuretics can cause it. Pt also says that she drinks 10-12 glasses of water a day and she had been going to the bathroom more frequently. Pt is requesting to remove or decrease HCTZ component of Hyzaar. Please advise. Initial call taken by: Crissie Sickles, [place],  January 14, 2010 9:28 AM  Follow-up for Phone Call        i changed to just losartan.  ret in a few weeks. Follow-up by: Donavan Foil MD,  January 14, 2010 12:13 PM  Additional Follow-up for Phone Call Additional follow up Details #1::        Pt informed via VM, told to schd ROV  Additional Follow-up by: Crissie Sickles, [place],  January 14, 2010 12:58 PM    New/Updated Medications: LOSARTAN POTASSIUM 100 MG TABS (LOSARTAN POTASSIUM) 1 tab once daily Prescriptions: LOSARTAN POTASSIUM 100 MG TABS (LOSARTAN POTASSIUM) 1 tab once daily  #30 x 1   Entered and Authorized by:   Donavan Foil MD   Signed by:   Donavan Foil MD on 01/14/2010   Method used:   Electronically to        [facility]. [id number]* (retail)       [place].       [place], [place]  [address]       Ph: [phone] or [phone]       Fax: [phone]   RxID:   [id number]

## 2010-05-02 NOTE — Progress Notes (Signed)
Summary: Xray?  Phone Note Call from Patient Call back at Home Phone [phone]   Caller: Patient Summary of Call: pt called requesting MD to review hardcopy of xray she had done at Dr. Jerl Santos office. I advised pt to request a copy of her OV, labs and/or xray be sent MD's attn for review. Once MD receives she can make appt to discuss. Pt staes thats he was not satified with treatment she received from Dr. Darlin Coco Initial call taken by: Crissie Sickles, [place],  May 18, 2009 2:36 PM

## 2010-05-02 NOTE — Progress Notes (Signed)
Summary: Med change req?  Phone Note Call from Patient Call back at Home Phone [phone]   Caller: Patient Summary of Call: Pt came into clinic today stating she would like  to go back to Hyzaar 50/12.5mg  instead of the more recent 100mg  Losartan. Pt says she did not realized that there was not a lower dose of the diuretic (Pt was advised when she requested the change) and doesn't like cutting tab in half as she has been doing. Please advise, okay to change back? Initial call taken by: Crissie Sickles, [place],  February 05, 2010 2:20 PM  Follow-up for Phone Call        taking just the losartan was in reponse to your inquiry about the gout.  i think just the losartan is a good idea. Follow-up by: Donavan Foil MD,  February 05, 2010 2:48 PM  Additional Follow-up for Phone Call Additional follow up Details #1::        left message on machine for pt to return my call. Crissie Sickles, CMA  February 06, 2010 4:24 PM   Pt informed of above and previous phone note, but still insists that she willnot take Losartan 100mg  and will only take Hyzaar 50/12.5. Pt is requesting this be updated on med list, please advise. Additional Follow-up by: Crissie Sickles, [place],  February 07, 2010 8:55 AM    Additional Follow-up for Phone Call Additional follow up Details #2::    i changed back Follow-up by: Donavan Foil MD,  February 07, 2010 9:25 AM  New/Updated Medications: LOSARTAN POTASSIUM-HCTZ 50-12.5 MG TABS (LOSARTAN POTASSIUM-HCTZ) 1 tab once daily Prescriptions: LOSARTAN POTASSIUM-HCTZ 50-12.5 MG TABS (LOSARTAN POTASSIUM-HCTZ) 1 tab once daily  #30 x 11   Entered and Authorized by:   Donavan Foil MD   Signed by:   Donavan Foil MD on 02/07/2010   Method used:   Electronically to        [facility]. [id number]* (retail)       [place].       [place], [place]  [address]       Ph: [phone] or [phone]       Fax: [phone]   RxID:    [id number]

## 2010-05-02 NOTE — Assessment & Plan Note (Signed)
Summary: flu shot-lb  Nurse Visit   Allergies: 1)  * Vioxx 2)  Biaxin 3)  Augmentin 4)  * Tussionex 5)  Zithromax 6)  Ceftin  Immunizations Administered:  Influenza Vaccine # 1:    Vaccine Type: Fluvax 3+    Site: left deltoid    Mfr: [place]    Dose: 0.5 ml    Route: IM    Given by: Rebeca Alert CMA ([place])    Exp. Date: 09/28/2010    Lot #: [id number]    VIS given: 2011  Orders Added: 1)  Flu Vaccine 10yrs + [id number] 2)  Admin 1st Vaccine [id number]

## 2010-05-02 NOTE — Assessment & Plan Note (Signed)
Summary: YEARLY FU/ MEDICARE / LABS AFTER /NWS   Vital Signs:  Patient profile:   77 year old female Weight:      106 pounds Temp:     97.4 degrees F Pulse rate:   74 / minute BP sitting:   140 / 90  (left arm) Cuff size:   regular  Vitals Entered By: Charlsie Quest, CMA (September 17, 2009 8:17 AM) CC: CPX - recent wkup from cardiology and dr deveshwar.    CC:  CPX - recent wkup from cardiology and dr deveshwar. .  History of Present Illness: here for regular wellness examination.  she's feeling pretty well in general, and does not drink or smoke.   Current Medications (verified): 1)  Multivitamins   Tabs (Multiple Vitamin) .... Take 1 By Mouth Qd 2)  Oscal 500/200 D-3 500-200 Mg-Unit  Tabs (Calcium-Vitamin D) .[name].. 1 By Mouth Tid 3)  B Complex   Tabs (B Complex Vitamins) .... Take 1 By Mouth Qd 4)  Hyzaar 50-12.5 Mg  Tabs (Losartan Potassium-Hctz) .... Take 1 By Mouth Qd 5)  Metoprolol Tartrate 25 Mg  Tabs (Metoprolol Tartrate) .... Take 1 By Mouth Qd 6)  Cvs Natural Fish Oil 1000 Mg Caps (Omega-3 Fatty Acids) .[name].. 1 Capsule Daily  Allergies (verified): 1)  * Vioxx 2)  Biaxin 3)  Augmentin 4)  * Tussionex 5)  Zithromax 6)  Ceftin  Family History: Reviewed history from 03/09/2007 and no changes required. sister and neice with sarcoidosis mother with stomach cancer  brother with colon cancer brother with NHL  Social History: Reviewed history from 07/02/2007 and no changes required. Never Smoked Alcohol use-no retired  married  Review of Systems  The patient denies weight loss, weight gain, vision loss, decreased hearing, chest pain, syncope, prolonged cough, headaches, abdominal pain, melena, hematochezia, severe indigestion/heartburn, hematuria, suspicious skin lesions, and depression.    Physical Exam  General:  normal appearance.   Head:  head: no deformity eyes: no periorbital swelling, no proptosis external nose and ears are normal mouth: no lesion  seen Neck:  Supple without thyroid enlargement or tenderness.  Breasts:  sees gyn  Lungs:  Clear to auscultation bilaterally. Normal respiratory effort.  Abdomen:  abdomen is soft, nontender.  no hepatosplenomegaly.   not distended.  no hernia  Rectal:  sees gyn  Genitalia:  sees gyn  Msk:  muscle bulk and strength are grossly normal.  no obvious joint swelling.  gait is normal and steady  Extremities:  no deformity.  no ulcer on the feet.  feet are of normal color and temp.  no edema  Neurologic:  cn 2-12 grossly intact.   readily moves all 4's.   sensation is intact to touch on the feet  Cervical Nodes:  No significant adenopathy.  Psych:  Alert and cooperative; normal mood and affect; normal attention span and concentration.   Additional Exam:  SEPARATE EVALUATION FOLLOWS--EACH PROBLEM HERE IS NEW, NOT RESPONDING TO TREATMENT, OR POSES SIGNIFICANT RISK TO THE PATIENT'S HEALTH: HISTORY OF THE PRESENT ILLNESS: 1 month of slight itching at the site of a tick bite at the right leg.  no assoc fever. PAST MEDICAL HISTORY reviewed and up to date today REVIEW OF SYSTEMS: denies sob PHYSICAL EXAMINATION: dorsalis pedis intact bilat.  no carotid bruit cv: reg rate and rhythm.  no murmur. minimal rash at the right posterior tibial area (5 mm) LAB/XRAY RESULTS: Cholesterol LDL 146.3 mg/dL IMPRESSION: rash, due to tick bite mild dyslipidemia PLAN: see instruction  sheet    Impression & Recommendations:  Problem # 1:  ROUTINE GENERAL MEDICAL EXAM@HEALTH  CARE FACL (ICD-V70.0)  Medications Added to Medication List This Visit: 1)  Fluticasone Propionate 0.05 % Crea (Fluticasone propionate) .... 4x a day as needed for rash.  Other Orders: T-Parathyroid Hormone, Intact w/ Calcium [id number]) TLB-Lipid Panel (80061-LIPID) TLB-TSH (Thyroid Stimulating Hormone) (84443-TSH) TLB-A1C / Hgb A1C (Glycohemoglobin) (83036-A1C) Est. Patient Level III [id number]) Est. Patient 65& >  [id number])  Preventive Care Screening  Bone Density:    Date:  07/18/2008    Results:  normal std dev     gyn is dr Helane Rima   Patient Instructions: 1)  triamcinolone cream 4x a day as needed for itching. 2)  please consider these measures for your health:  minimize alcohol.  do not use tobacco products.  have a colonoscopy at least every 10 years from age 72.  keep firearms safely stored.  always use seat belts.  have working smoke alarms in your home.  see the dentist regularly.  never drive under the influence of alcohol or drugs (including prescription drugs).   3)  please let me know what your wishes would be, if artificial life support measures should become necessary.  it is critically important to prevent falling down (keep floor areas well-lit, dry, and free of loose objects) 4)  Please schedule a follow-up appointment in 1 year. 5)  blood tests are being ordered for you today.  please call [phone] to hear your test results. 6)  addendum:  we discussed code status.  pt requests full code, but would not want to be started or maintained on artificial life-support measures if there was not a reasonable chance of recovery 7)  (update: i left message on phone-tree:  it is controversial as to whether you should take chol med in this situation.  let me know if you want rx). Prescriptions: FLUTICASONE PROPIONATE 0.05 % CREA (FLUTICASONE PROPIONATE) 4x a day as needed for rash.  #1 small tube x 0   Entered and Authorized by:   Donavan Foil MD   Signed by:   Donavan Foil MD on 09/17/2009   Method used:   Electronically to        [facility]. [id number]* (retail)       [place].       [place], [place]  [address]       Ph: [phone] or [phone]       Fax: [phone]   RxID:   [id number]    Preventive Care Screening  Bone Density:    Date:  07/18/2008    Results:  normal std dev     gyn is dr Helane Rima

## 2010-06-20 ENCOUNTER — Inpatient Hospital Stay (HOSPITAL_COMMUNITY)
Admission: EM | Admit: 2010-06-20 | Discharge: 2010-06-22 | DRG: 378 | Disposition: A | Discharge status: Home / Self Care | Payer: Medicare Other | Attending: Family Medicine | Admitting: Family Medicine

## 2010-06-20 DIAGNOSIS — J309 Allergic rhinitis, unspecified: Secondary | ICD-10-CM | POA: Diagnosis present

## 2010-06-20 DIAGNOSIS — K2970 Gastritis, unspecified, without bleeding: Secondary | ICD-10-CM | POA: Diagnosis present

## 2010-06-20 DIAGNOSIS — K2990 Gastroduodenitis, unspecified, without bleeding: Secondary | ICD-10-CM | POA: Diagnosis present

## 2010-06-20 DIAGNOSIS — K449 Diaphragmatic hernia without obstruction or gangrene: Secondary | ICD-10-CM | POA: Diagnosis present

## 2010-06-20 DIAGNOSIS — D649 Anemia, unspecified: Secondary | ICD-10-CM | POA: Diagnosis present

## 2010-06-20 DIAGNOSIS — D62 Acute posthemorrhagic anemia: Secondary | ICD-10-CM | POA: Diagnosis present

## 2010-06-20 DIAGNOSIS — K922 Gastrointestinal hemorrhage, unspecified: Principal | ICD-10-CM | POA: Diagnosis present

## 2010-06-20 DIAGNOSIS — E876 Hypokalemia: Secondary | ICD-10-CM | POA: Diagnosis present

## 2010-06-20 DIAGNOSIS — I10 Essential (primary) hypertension: Secondary | ICD-10-CM | POA: Diagnosis present

## 2010-06-20 LAB — POCT I-STAT, CHEM 8
Calcium, Ion: 1.14 mmol/L (ref 1.12–1.32)
Creatinine, Ser: 1.1 mg/dL (ref 0.4–1.2)
Potassium: 4.5 mEq/L (ref 3.5–5.1)

## 2010-06-20 LAB — PROTIME-INR: INR: 0.92 (ref 0.00–1.49)

## 2010-06-20 LAB — TYPE AND SCREEN
ABO/RH(D): B NEG
Antibody Screen: NEGATIVE

## 2010-06-21 ENCOUNTER — Encounter: Payer: Self-pay | Admitting: Gastroenterology

## 2010-06-21 ENCOUNTER — Ambulatory Visit: Payer: Self-pay | Admitting: Endocrinology

## 2010-06-21 DIAGNOSIS — K2970 Gastritis, unspecified, without bleeding: Secondary | ICD-10-CM

## 2010-06-21 DIAGNOSIS — K2990 Gastroduodenitis, unspecified, without bleeding: Secondary | ICD-10-CM

## 2010-06-21 DIAGNOSIS — Z0289 Encounter for other administrative examinations: Secondary | ICD-10-CM

## 2010-06-21 DIAGNOSIS — D649 Anemia, unspecified: Secondary | ICD-10-CM

## 2010-06-21 DIAGNOSIS — K921 Melena: Secondary | ICD-10-CM

## 2010-06-21 LAB — CARDIAC PANEL(CRET KIN+CKTOT+MB+TROPI)
CK, MB: 2.3 ng/mL (ref 0.3–4.0)
CK, MB: 2.4 ng/mL (ref 0.3–4.0)
Relative Index: 0.9 (ref 0.0–2.5)
Relative Index: 0.9 (ref 0.0–2.5)
Troponin I: 0.02 ng/mL (ref 0.00–0.06)

## 2010-06-21 LAB — CBC
HCT: 34.3 % — ABNORMAL LOW (ref 36.0–46.0)
HCT: 35.1 % — ABNORMAL LOW (ref 36.0–46.0)
MCH: 31 pg (ref 26.0–34.0)
MCHC: 33.9 g/dL (ref 30.0–36.0)
MCHC: 34.5 g/dL (ref 30.0–36.0)
MCV: 89.9 fL (ref 78.0–100.0)
MCV: 90 fL (ref 78.0–100.0)
MCV: 92.1 fL (ref 78.0–100.0)
Platelets: 191 10*3/uL (ref 150–400)
Platelets: 194 10*3/uL (ref 150–400)
RDW: 16.1 % — ABNORMAL HIGH (ref 11.5–15.5)
RDW: 16.4 % — ABNORMAL HIGH (ref 11.5–15.5)
WBC: 6.8 10*3/uL (ref 4.0–10.5)
WBC: 9.1 10*3/uL (ref 4.0–10.5)

## 2010-06-21 LAB — COMPREHENSIVE METABOLIC PANEL
AST: 27 U/L (ref 0–37)
Albumin: 3.3 g/dL — ABNORMAL LOW (ref 3.5–5.2)
Alkaline Phosphatase: 33 U/L — ABNORMAL LOW (ref 39–117)
Chloride: 109 mEq/L (ref 96–112)
GFR calc Af Amer: >60 mL/min (ref >60)
Potassium: 3.3 mEq/L — ABNORMAL LOW (ref 3.5–5.1)
Total Bilirubin: 0.8 mg/dL (ref 0.3–1.2)
Total Protein: 5.7 g/dL — ABNORMAL LOW (ref 6.0–8.3)

## 2010-06-21 LAB — DIFFERENTIAL
Lymphocytes Relative: 27 % (ref 12–46)
Lymphs Abs: 1.9 10*3/uL (ref 0.7–4.0)
Monocytes Absolute: 1 10*3/uL (ref 0.1–1.0)
Monocytes Relative: 15 % — ABNORMAL HIGH (ref 3–12)
Neutro Abs: 3.8 10*3/uL (ref 1.7–7.7)

## 2010-06-21 LAB — BASIC METABOLIC PANEL
Calcium: 9.2 mg/dL (ref 8.4–10.5)
Potassium: 3.5 mEq/L (ref 3.5–5.1)

## 2010-06-21 LAB — MAGNESIUM: Magnesium: 2.1 mg/dL (ref 1.5–2.5)

## 2010-06-21 LAB — ABO/RH: ABO/RH(D): B NEG

## 2010-06-21 LAB — CK TOTAL AND CKMB (NOT AT ARMC)
CK, MB: 2.7 ng/mL (ref 0.3–4.0)
Relative Index: 0.9 (ref 0.0–2.5)
Total CK: 303 U/L — ABNORMAL HIGH (ref 7–177)

## 2010-06-21 LAB — TROPONIN I: Troponin I: 0.02 ng/mL (ref 0.00–0.06)

## 2010-06-22 ENCOUNTER — Encounter: Payer: Self-pay | Admitting: Gastroenterology

## 2010-06-22 LAB — CBC
HCT: 32.8 % — ABNORMAL LOW (ref 36.0–46.0)
MCH: 31.2 pg (ref 26.0–34.0)
MCHC: 34.1 g/dL (ref 30.0–36.0)
MCV: 91.4 fL (ref 78.0–100.0)
Platelets: 150 10*3/uL (ref 150–400)
RDW: 15.8 % — ABNORMAL HIGH (ref 11.5–15.5)
WBC: 9.4 10*3/uL (ref 4.0–10.5)

## 2010-06-24 LAB — GLUCOSE, CAPILLARY
Glucose-Capillary: 103 mg/dL — ABNORMAL HIGH (ref 70–99)
Glucose-Capillary: 90 mg/dL (ref 70–99)

## 2010-06-26 LAB — HM MAMMOGRAPHY

## 2010-06-27 NOTE — Procedures (Signed)
Summary: Upper Endoscopy  Patient: [name] Note: All result statuses are Final unless otherwise noted.  Tests: (1) Upper Endoscopy (EGD)   EGD Upper Endoscopy       [facility]     [place], [place]  [address]          ENDOSCOPY PROCEDURE REPORT          PATIENT:  Lindsay Doyle, Lindsay Doyle  MR#:  [id number]     BIRTHDATE:  1933-12-28, 76 yrs. old  GENDER:  female     ENDOSCOPIST:  Milus Banister, MD     PROCEDURE DATE:  06/21/2010     PROCEDURE:  EGD, diagnostic 43235     ASA CLASS:  Class II     INDICATIONS:  recent melena, anemia (Hb 7.0, MCV 104)     MEDICATIONS:   Fentanyl 40 mcg IV, Versed 4 mg IV     TOPICAL ANESTHETIC:  Cetacaine Spray          DESCRIPTION OF PROCEDURE:   After the risks benefits and     alternatives of the procedure were thoroughly explained, informed     consent was obtained.  The Pentax Gastroscope [id number] endoscope     was introduced through the mouth and advanced to the second     portion of the duodenum, without limitations.  The instrument was     slowly withdrawn as the mucosa was fully examined.     <<PROCEDUREIMAGES>>     Mild gastritis was found. There was mild, non specific gastritis     (see image005).  A hiatal hernia was found. This was 1-2cm (see     image002).  A Schatzki's ring was found. This was not dilated (see     image006).  Otherwise the examination was normal (see image004 and     image003).    Retroflexed views revealed no abnormalities.    The     scope was then withdrawn from the patient and the procedure     completed.     COMPLICATIONS:  None          ENDOSCOPIC IMPRESSION:     1) Mild gastritis     2) Small hiatal hernia     3) Schatzki's ring at GE junction; not dilated     4) Otherwise normal examination          RECOMMENDATIONS:     1) Will proceed with colonoscopy tomorrow (had normal     examination in 2005 with Dr. Deatra Ina)       ______________________________     Milus Banister, MD          cc: Erskine Emery, MD          n.     eSIGNED:   Milus Banister at 06/21/2010 11:59 AM          Jose Persia, [id number]  Note: An exclamation mark (!) indicates a result that was not dispersed into the flowsheet. Document Creation Date: 06/21/2010 12:00 PM _______________________________________________________________________  (1) Order result status: Final Collection or observation date-time: 06/21/2010 11:53 Requested date-time:  Receipt date-time:  Reported date-time:  Referring Physician:   Ordering Physician: Owens Loffler [phone]) Specimen Source:  Source: Tawanna Cooler Order Number: [id number] Lab site:

## 2010-06-27 NOTE — Procedures (Signed)
Summary: Colonoscopy  Patient: Geralene Frommer Note: All result statuses are Final unless otherwise noted.  Tests: (1) Colonoscopy (COL)   COL Colonoscopy           [facility]     [place], [place]  [address]          COLONOSCOPY PROCEDURE REPORT          PATIENT:  Tressy, Rost  MR#:  [id number]     BIRTHDATE:  Jun 17, 1933, 76 yrs. old  GENDER:  female     ENDOSCOPIST:  Milus Banister, MD     PROCEDURE DATE:  06/22/2010     PROCEDURE:  Colonoscopy [phone]     ASA CLASS:  Class II     INDICATIONS:  recent melenic stools, Hb 7, transfused 3 units PRBS     (outside hosp); EGD yesterday here showed mild gastritis but no     clear bleeding source     MEDICATIONS:   Fentanyl 75 mcg IV, Versed 6 mg IV          DESCRIPTION OF PROCEDURE:   After the risks benefits and     alternatives of the procedure were thoroughly explained, informed     consent was obtained.  Digital rectal exam was performed and     revealed no rectal masses.   The Pentax Colonoscope [id number]     endoscope was introduced through the anus and advanced to the     cecum, which was identified by both the appendix and ileocecal     valve, without limitations.  The quality of the prep was     excellent, using MoviPrep.  The instrument was then slowly     withdrawn as the colon was fully examined.     <<PROCEDUREIMAGES>>     FINDINGS:  A normal appearing cecum, ileocecal valve, and     appendiceal orifice were identified. The ascending, hepatic     flexure, transverse, splenic flexure, descending, sigmoid colon,     and rectum appeared unremarkable (see image001, image002, and     image003).   Retroflexed views in the rectum revealed no     abnormalities.    The scope was then withdrawn from the patient     and the procedure completed.     COMPLICATIONS:  None          ENDOSCOPIC IMPRESSION:     1) Normal colon     2) No polyps or cancers.  No obvious source of recent  melenic     stools on this examination.          RECOMMENDATIONS:     Given no recurrent overt bleeding, it is safe for d/c home later     today on once daily PPI (antiacid medicine).     [facility] will contact you about a "pill camera" examination of     your small bowel as an outpatient sometime next week.          ______________________________     Milus Banister, MD          cc: Erskine Emery, MD          n.     eSIGNED:   Milus Banister at 06/22/2010 08:30 AM          Jose Persia, [id number]  Note: An exclamation mark (!) indicates a result that was not dispersed into  the flowsheet. Document Creation Date: 06/22/2010 8:31 AM _______________________________________________________________________  (1) Order result status: Final Collection or observation date-time: 06/22/2010 08:25 Requested date-time:  Receipt date-time:  Reported date-time:  Referring Physician:   Ordering Physician: Owens Loffler [phone]) Specimen Source:  Source: Tawanna Cooler Order Number: [id number] Lab site:

## 2010-06-27 NOTE — Discharge Summary (Signed)
  NAME:  Lindsay Doyle, TOAL NO.:  [id number]  MEDICAL RECORD NO.:  [id number]           PATIENT TYPE:  I  LOCATION:  [id number]                         FACILITY:  [facility]  PHYSICIAN:  Verneita Griffes, MD        DATE OF BIRTH:  1934/02/12  DATE OF ADMISSION:  06/20/2010 DATE OF DISCHARGE:  06/22/2010                              DISCHARGE SUMMARY   CURRENT FACILITY:  [facility]  DISCHARGE DIAGNOSES: 1. Gastrointestinal bleed  2. Hypokalemia. 3. Hypertension. 4. Anemia. 5. Allergic rhinitis.  DISCHARGE MEDICATIONS: 1. Losartan/HCTZ 50/12.5. 2. Metoprolol tartrate 25 one tablet q.a.m. 3. Multivitamins 1 tablet q.a.m. 4. Calcium over-the-counter 1 tablet q.a.m. 5. Vitamin B complex 1 tablet q.a.m. 6. Vitamin E over-the-counter 1 tablet q.a.m.  NEW MEDICATIONS:  Omeprazole 40 mg once daily, prescription 30 tablets.  PERTINENT CONSULT:  Dr. Deatra Ina of Gastroenterology.  PERTINENT STUDIES: 1. Upper endoscopy done on June 20, 2010, showed mild gastritis,     small hiatal hernia.     a.     Schatzki ring at GE junction, nondilated. 2. Colonoscopy done June 22, 2010, done showed;     a.     Normal colon.     b.     No polyps or cancer.     c.     No obvious source from recent melanotic stools on exam.  HISTORY OF PRESENT ILLNESS:  Please see admission dictation by Dr. Hal Hope dated June 08, 2010.  This is 77 year old lady, who has history of hypertension, who went to vacation and had been noticing dark stools.  She was found at [facility] to be anemic and had hemoglobin of 7 and received 3 units of packed red blood cells and decided to transfer AMA as she lives in town [place] for further workup.  She was admitted for emergent endoscopy.  Dr. Deatra Ina reviewed her for the same and she ultimately had endoscopy as noted above.  Her hemoglobin is very stable on this admission and although no specific source of bleed could be found.  The  patient is scheduled for outpatient pill and capsule endoscopy workup.  ASSESSMENT AND PLAN:  I have encouraged her to follow up with her primary care physician Dr. Loanne Drilling for repeat CBC and possible retic count.  I have not placed on iron.  Per GI recommendations, I placed her on omeprazole 40 mg once daily.  The patient was stable on the day of discharge; although, she had some mild gas.  PHYSICAL EXAMINATION:  VITAL SIGNS:  Temperature was 98.2, blood pressure was 99991111 systolic, respirations 20, and sats were 99% on room air.  CONDITION ON DISCHARGE:  The patient was deemed stable for discharge and discharged home.          ______________________________ Verneita Griffes, MD     JS/MEDQ  D:  06/22/2010  T:  06/22/2010  Job:  [id number]  cc:   Hilliard Clark A. Loanne Drilling, MD Sandy Salaam. Deatra Ina, MD,FACG  Electronically Signed by Verneita Griffes MD on 06/27/2010 [id number] AM

## 2010-06-28 ENCOUNTER — Telehealth: Payer: Self-pay | Admitting: Gastroenterology

## 2010-06-28 NOTE — Telephone Encounter (Signed)
Lindsay Doyle this is a [facility] pt Dr Ardis Hughs only saw the pt in the hospital.  I have not called her not sure what she wants.

## 2010-06-28 NOTE — Telephone Encounter (Signed)
Spoke with pt, will scheduled capsule endo next week.

## 2010-07-01 NOTE — H&P (Signed)
NAME:  Lindsay Doyle, Lindsay Doyle NO.:  [id number]  MEDICAL RECORD NO.:  [id number]           PATIENT TYPE:  E  LOCATION:  [facility]                         FACILITY:  [facility]  PHYSICIAN:  Rise Patience, MDDATE OF BIRTH:  [birth year]  DATE OF ADMISSION:  06/20/2010 DATE OF DISCHARGE:                             HISTORY & PHYSICAL   PRIMARY CARE PHYSICIAN:  Dr. Renato Shin of [facility].  PRIMARY GASTROENTEROLOGY:  Dr. Deatra Ina of [facility].  CHIEF COMPLAINT:  Weakness.  HISTORY OF PRESENT ILLNESS:  A 77 year old female with history of hypertension, has had originally gone for vacation in the mountains in [place], 4 days ago.  Before going to the vacation, she has been noticing black stools.  While she reached that location to her resort, she felt very weak and tired, and was found to be hypotensive.  The patient had gone to the hospital nearby which is [facility] at [place], [place], [place], where in the patient was found to be anemic and was found to have a hemoglobin around 7 on June 28, 2010.  Subsequently, this patient had received 3 units of packed red blood cells and the patient was advised to get EGD.  The patient found AMA and came today to ER at [facility] as the patient has billing for the calendar year.  At this time, the patient's hemoglobin done shows to be 13.6 in i-STAT.  PT/INR is normal.  The patient is admitted for further workup.  The patient still feels very weak and tired.  Denies any chest pain.  Does have some shortness of breath on exertion.  Denies any nausea, vomiting, abdominal pain, dysuria, discharge, or diarrhea.  Denies using any NSAIDs.  The patient did use a medicine called Equate, which looks like it is a handy instrument.  The patient is admitted for further workup.  PAST MEDICAL HISTORY:  Hypertension, allergic rhinitis.  PAST SURGICAL HISTORY:  The patient has had hysterectomy,  tonsillectomy, tubal ligation, and cardiac cath in 1992, negative for coronary artery disease.  ALLERGIES:  The patient is allergic to BIAXIN and AUGMENTIN.  FAMILY HISTORY:  Negative for colon cancer, but her mother had stomach cancer, her sister suffers from sarcoidosis.  SOCIAL HISTORY:  The patient is married.  Denies smoking cigarettes, drinking alcohol, use illegal drugs.  She is a full code.  REVIEW OF SYSTEMS:  As per in the history of presenting illness, nothing else significant.  PHYSICAL EXAMINATION:  GENERAL:  The patient examined at bedside, not in acute distress. VITAL SIGNS:  Blood pressure 171/97, pulse 97 per minute, temperature 98.3, respirations 18 per minute, O2 sat 100%. HEENT:  Anicteric.  There is pallor.  The patient's face looks pale. There is no facial asymmetry.  Tongue is midline.  No neck rigidity. CHEST:  Bilateral air entry present.  No rhonchi.  No crepitation. HEART:  S1 and S2 heard. ABDOMEN:  Soft, nontender.  Bowel sounds heard. CNS:  Alert, awake, and oriented to time, place, and person.  Moves upper and lower extremities, 5/5. EXTREMITIES:  Peripheral pulses felt.  No edema.  LABS:  The labs done now at this point looks like it is from i-STAT, hemoglobin is 13.6, hematocrit is 40, PT/INR is 12.6 and 0.9.  Basic metabolic panel, sodium 0000000, potassium 4.5, chloride 102, glucose 102, BUN 17, creatinine 1.1.  Fecal occult blood is positive.  ASSESSMENT: 1. Gastrointestinal bleed. 2. Generalized weakness and fatigue. 3. Recent admission for severe anemia, hemoglobin of 7 and had     received 3 units of packed red blood cells just 2 days ago. 4. History of hypertension.  PLAN: 1. At this time, we will admit the patient to telemetry. 2. For her GI bleed, we are going to recheck a CBC STAT.  We will type     and crossmatch 2 units and hold, transfuse as needed.  I am going     to keep the patient n.p.o.  Consult GI in a.m.  I am also going  to     keep the patient on Protonix 40 IV q. 12. 3. For her hypertension, we will keep the patient on p.r.n. IV     hydralazine.  Further recommendation based on the test orders and     clinical course.     Rise Patience, MD     ANK/MEDQ  D:  06/20/2010  T:  06/21/2010  Job:  [id number]  cc:   Hilliard Clark A. Loanne Drilling, MD Sandy Salaam. Deatra Ina, MD,FACG  Electronically Signed by Gean Birchwood MD on 07/01/2010 08:02:53 AM

## 2010-07-09 ENCOUNTER — Telehealth: Payer: Self-pay

## 2010-07-09 NOTE — Telephone Encounter (Signed)
Called pt and left her a message to call back to schedule her teaching and procedure for the capsule endoscopy.

## 2010-07-11 ENCOUNTER — Telehealth: Payer: Self-pay

## 2010-07-11 NOTE — Telephone Encounter (Signed)
Yes - 285.9

## 2010-07-11 NOTE — Telephone Encounter (Signed)
Pt called requesting CBC as per her discharge instructions from hospital stay at [facility] for GI bleed. Please advise, okay to schedule in SAE's absence?

## 2010-07-12 NOTE — Telephone Encounter (Signed)
Pt advised, labs scheduled same day as post-hosp F/U with SAE per pt request

## 2010-07-16 ENCOUNTER — Encounter: Payer: Medicare Other | Admitting: Gastroenterology

## 2010-07-16 ENCOUNTER — Telehealth: Payer: Self-pay

## 2010-07-16 DIAGNOSIS — K921 Melena: Secondary | ICD-10-CM

## 2010-07-16 NOTE — Telephone Encounter (Signed)
Patient will come at 2pm for teaching for the capsule endo.

## 2010-07-16 NOTE — Telephone Encounter (Signed)
Patient called back and states that she wants to speak with Dr. Loanne Drilling prior to scheduling the capsule appt. Pt states she  will call us back.

## 2010-07-16 NOTE — Telephone Encounter (Signed)
Pt came in today for teaching regarding capsule endo. Pt scheduled for capsule endo for 07/22/10@8am . Pt aware and verbalized understanding.

## 2010-07-22 ENCOUNTER — Telehealth: Payer: Self-pay

## 2010-07-22 ENCOUNTER — Ambulatory Visit (INDEPENDENT_AMBULATORY_CARE_PROVIDER_SITE_OTHER): Payer: Medicare Other | Admitting: Gastroenterology

## 2010-07-22 DIAGNOSIS — K922 Gastrointestinal hemorrhage, unspecified: Secondary | ICD-10-CM

## 2010-07-22 DIAGNOSIS — K921 Melena: Secondary | ICD-10-CM

## 2010-07-22 NOTE — Telephone Encounter (Signed)
Late Entry:  Pt came in for capsule endo teaching on 07/16/10  Pt instructed regarding the capsule endoscopy. Pt education material given to the pt. Pt signed education form and all of her questions were answered.

## 2010-07-23 ENCOUNTER — Other Ambulatory Visit (INDEPENDENT_AMBULATORY_CARE_PROVIDER_SITE_OTHER): Payer: Medicare Other

## 2010-07-23 ENCOUNTER — Other Ambulatory Visit: Payer: Self-pay | Admitting: Endocrinology

## 2010-07-23 ENCOUNTER — Ambulatory Visit (INDEPENDENT_AMBULATORY_CARE_PROVIDER_SITE_OTHER): Payer: Medicare Other | Admitting: Endocrinology

## 2010-07-23 ENCOUNTER — Ambulatory Visit (INDEPENDENT_AMBULATORY_CARE_PROVIDER_SITE_OTHER)
Admission: RE | Admit: 2010-07-23 | Discharge: 2010-07-23 | Disposition: A | Discharge status: Home / Self Care | Payer: Medicare Other | Source: Ambulatory Visit | Attending: Endocrinology | Admitting: Endocrinology

## 2010-07-23 ENCOUNTER — Encounter: Payer: Self-pay | Admitting: Endocrinology

## 2010-07-23 ENCOUNTER — Ambulatory Visit: Payer: Medicare Other | Admitting: Endocrinology

## 2010-07-23 VITALS — BP 128/82 | HR 93 | Temp 98.2°F | Ht 64.0 in | Wt 110.0 lb

## 2010-07-23 DIAGNOSIS — M25552 Pain in left hip: Secondary | ICD-10-CM | POA: Insufficient documentation

## 2010-07-23 DIAGNOSIS — D649 Anemia, unspecified: Secondary | ICD-10-CM

## 2010-07-23 DIAGNOSIS — M25559 Pain in unspecified hip: Secondary | ICD-10-CM

## 2010-07-23 DIAGNOSIS — I10 Essential (primary) hypertension: Secondary | ICD-10-CM

## 2010-07-23 LAB — CBC WITH DIFFERENTIAL/PLATELET
Basophils Absolute: 0 10*3/uL (ref 0.0–0.1)
Eosinophils Absolute: 0.2 10*3/uL (ref 0.0–0.7)
Hemoglobin: 13.1 g/dL (ref 12.0–15.0)
Lymphocytes Relative: 42.9 % (ref 12.0–46.0)
MCHC: 34 g/dL (ref 30.0–36.0)
Monocytes Relative: 12.6 % — ABNORMAL HIGH (ref 3.0–12.0)
Neutro Abs: 1.5 10*3/uL (ref 1.4–7.7)
Neutrophils Relative %: 37.7 % — ABNORMAL LOW (ref 43.0–77.0)
Platelets: 208 10*3/uL (ref 150.0–400.0)
RDW: 16.5 % — ABNORMAL HIGH (ref 11.5–14.6)

## 2010-07-23 NOTE — Patient Instructions (Addendum)
blood tests and an x-ray are being ordered for you today.  please call [phone] to hear your test results. pending the test results, please continue the same medications for now Please make a regular physical appointment in 2-3 months Please call if you want to see an orthopedic dr for your pain (update: i left message on phone-tree:  rx as we discussed.  Call if you want to see ortho).

## 2010-07-23 NOTE — Progress Notes (Signed)
Pt here today for capsule endoscopy for Dr. Deatra Ina. Pt verbalized understanding of all verbal and written instructions. Pt tolerated well.   Lot# [id number] Exp:  2013/04         25

## 2010-07-23 NOTE — Progress Notes (Signed)
  Subjective:    Patient ID: Lindsay Doyle, female    DOB: Apr 08, 1933, 77 y.o.   MRN: [id number]  HPI Pt was recently hospitalized in [place], then at [facility] [place], for gi bleed.  She was transfused, and had egd and colonoscopy.  No cause was found, and she started capsule endoscopy yesterday.  She feels much better. She has resumed her bp meds.  No further bleeding. 1 month of moderate pain at the lat aspect of the left hip.  No assoc falls. Past Medical History  Diagnosis Date  . HYPERCHOLESTEROLEMIA 09/17/2009  . HYPERLIPIDEMIA 03/09/2007  . MITRAL VALVE PROLAPSE 03/09/2007  . ANEMIA-IRON DEFICIENCY 03/09/2007  . LEUKOPENIA, MILD 05/30/2008  . HYPERTENSION 11/12/2006  . ASTHMA 01/18/2007  . COPD 03/09/2007  . OSTEOARTHRITIS 02/19/2009    Spinal  . OSTEOPOROSIS 11/12/2006  . COLONIC POLYPS, HX OF 03/09/2007  . HYPERGLYCEMIA 07/02/2007  . ASYMPTOMATIC POSTMENOPAUSAL STATUS 07/17/2008  . Unspecified acute inflammation of orbit 05/12/2007  . PSVT 05/03/2009  . CONTUSION, LEFT FOOT 01/10/2010  . Positive H. pylori test 10/1996  . WBC decreased     chronic   Past Surgical History  Procedure Date  . Abdominal hysterectomy   . Tonsillectomy   . Tubal ligation   . Cataract extraction   . Cardiac catheterization 1992    S/P in 1992 at [facility] in [place]. This was negative for any coronary artery disease  . Carotid duplex 08/29/2003  . Electrocardiogram 06/04/2006    reports that she has never smoked. She does not have any smokeless tobacco history on file. She reports that she does not drink alcohol. Her drug history not on file. family history includes Cancer in her brother and mother and Sarcoidosis in her other and sister. Allergies  Allergen Reactions  . Azithromycin   . Cefuroxime Axetil   . Clarithromycin   . [id number] Acid+Aspartame   . Rofecoxib     REACTION: unspecified  . Tussionex Pennkinetic Er     Review of  Systems Denies loc and numbness    Objective:   Physical Exam GENERAL: no distress ABDOMEN: abdomen is soft, nontender.  no hepatosplenomegaly.   not distended.  no hernia Ext:  no edema.  The left hip is nontender Gait is normal and steady    Lab Results  Component Value Date   WBC 4.0* 07/23/2010   HGB 13.1 07/23/2010   HCT 38.7 07/23/2010   PLT 208.0 07/23/2010   CHOL 251* 09/17/2009   TRIG 89.0 09/17/2009   HDL 93.70 09/17/2009   LDLDIRECT 146.3 09/17/2009   ALT 15 06/21/2010   AST 27 06/21/2010   NA 141 06/21/2010   K 3.3* 06/21/2010   CL 109 06/21/2010   CREATININE 0.83 06/21/2010   BUN 11 06/21/2010   CO2 28 06/21/2010   TSH 3.263 06/20/2010   INR 0.92 06/20/2010   HGBA1C 5.8 09/17/2009   X-ray results are reviewed   Assessment & Plan:  Anemia due to gib, resolved. Htn, well-controlled Hip pain, uncertain etiology

## 2010-07-25 NOTE — Progress Notes (Signed)
Capsule endoscopy report   History: Patient with melena and a hemoglobin of 7. Upper endoscopy showed mild gastritis colonoscopy was normal  Procedure information and findings:  1. Complete study, a good prep 2  Scattered patchy small bowel erythema: possible inflammatory changes.  Summary and recommendation:  Per Dr. Erskine Emery.

## 2010-07-25 NOTE — Progress Notes (Signed)
ok 

## 2010-07-29 ENCOUNTER — Encounter: Payer: Self-pay | Admitting: Gastroenterology

## 2010-07-29 ENCOUNTER — Encounter: Payer: Self-pay | Admitting: Endocrinology

## 2010-07-30 ENCOUNTER — Telehealth: Payer: Self-pay

## 2010-07-30 NOTE — Telephone Encounter (Signed)
i left another phone-tree message

## 2010-07-30 NOTE — Telephone Encounter (Signed)
Pt called requesting lab results

## 2010-07-31 NOTE — Telephone Encounter (Signed)
Pt advised to phone tree

## 2010-08-02 ENCOUNTER — Telehealth: Payer: Self-pay | Admitting: Gastroenterology

## 2010-08-02 DIAGNOSIS — D649 Anemia, unspecified: Secondary | ICD-10-CM

## 2010-08-02 NOTE — Telephone Encounter (Signed)
Informed pt that Dr. Deatra Ina has been out of town. Let he know that I would call her as soon as I had the results.

## 2010-08-02 NOTE — Telephone Encounter (Signed)
Capsule endoscopy demonstrated scattered patches of erythema in the small bowel. She may have had inflammatory disease causing her bleeding.  These obtained followup Hemoccults, CBC in 2-3 weeks for a followup appointment

## 2010-08-05 ENCOUNTER — Telehealth: Payer: Self-pay

## 2010-08-05 DIAGNOSIS — M25552 Pain in left hip: Secondary | ICD-10-CM

## 2010-08-05 NOTE — Telephone Encounter (Signed)
Pt called requesting referral to Ortho. Pt aware that she will be contacted by [facility] with appt info

## 2010-08-05 NOTE — Telephone Encounter (Signed)
done

## 2010-08-05 NOTE — Telephone Encounter (Signed)
Addended by: Rosanne Sack R. on: 08/05/2010 09:13 AM   Modules accepted: Orders

## 2010-08-05 NOTE — Telephone Encounter (Signed)
Pt aware of results per Dr. Deatra Ina. Mailed pt hemoccult cards. Pt instructed to have cbc drawn in the lab 08/19/10 and given an appt to see Dr. Deatra Ina 08/20/10@2 :30pm. Pt aware of appt dates and times.

## 2010-08-07 ENCOUNTER — Telehealth: Payer: Self-pay | Admitting: Gastroenterology

## 2010-08-07 NOTE — Telephone Encounter (Signed)
Pt wanted to know if it was ok for her to wait until next week to do her stool cards. Let pt know that was fine.

## 2010-08-07 NOTE — Telephone Encounter (Signed)
Left message for pt to call back  °

## 2010-08-09 ENCOUNTER — Other Ambulatory Visit: Payer: Self-pay | Admitting: Orthopedic Surgery

## 2010-08-09 DIAGNOSIS — R102 Pelvic and perineal pain: Secondary | ICD-10-CM

## 2010-08-09 DIAGNOSIS — M533 Sacrococcygeal disorders, not elsewhere classified: Secondary | ICD-10-CM

## 2010-08-12 ENCOUNTER — Telehealth: Payer: Self-pay | Admitting: Gastroenterology

## 2010-08-12 NOTE — Telephone Encounter (Signed)
Pt cannot keep her appt for May, pt rescheduled to see Dr. Deatra Ina for f/u capsule endo scheduled for 09/02/10@2 :30pm. Pt aware of appt date and time.

## 2010-08-13 NOTE — Assessment & Plan Note (Signed)
[place]                             PULMONARY OFFICE NOTE   Lindsay, Doyle                        MRN:          [id number]  DATE:12/23/2006                            DOB:          05/13/1933    CHIEF COMPLAINT:  Follow up cough.   HISTORY OF PRESENT ILLNESS:  Lindsay Doyle presents for followup  today.  Her last visit was on November 18, 2006 for cough.  At that time,  office spirometry and a full pulmonary function test showed mild  obstruction.  She denied any fume exposure, smoking, asthma symptoms.  The only positive thing was a sister and a niece with sarcoidosis.  She  had taken a simplistic approach to her cough, and we prescribed her  budesonide nasal inhaler to treat postnasal drip on an empiric basis.   After that decision, she started taking her budesonide only on November 30, 2006.  She took it only for 2 days.  After that, she started noticing  an improvement in her cough but she decided that she would take  Robitussin cough syrup which she took between December 02, 2006 and  December 05, 2006.  Then on December 06, 2006, she said really coughed  hard to clear up some phlegm, and after that she had a severe headache.  Therefore, she did not take any of her medications that day.  Then on  the next day, December 07, 2006, her cough resolved.  Since then, she  has not had any cough.  She feels completely fine and almost at  baseline.  However, on further questioning she admitted that when she  watches TV and gets emotional, such as she cries, or she was at a party  and she was laughing loudly, she did get the same cough again but  without the sputum.  Overall, though, she feels fine and she feels she  is doing well.   PAST MEDICAL HISTORY:  No change from November 26, 2006.   PAST SURGICAL HISTORY:  No change since November 26, 2006.   ALLERGIES:  NO CHANGE SINCE November 26, 2006.   MEDICATIONS:  No change since November 26, 2006.  Currently not taking  nasal steroid inhaler or Robitussin.   SOCIAL HISTORY:  No change since November 26, 2006.   FAMILY HISTORY:  Sister has sarcoidosis and a niece has sarcoidosis.   REVIEW OF SYSTEMS:  Currently cough-free.  Otherwise, no change since  November 26, 2006.   PHYSICAL EXAMINATION:  VITAL SIGNS:  Weight 129.8 pounds.  Temperature  98, blood pressure 122/78, pulse 77, pulse ox 99% on room air.  Exam is  essentially unchanged from November 26, 2006.  GENERAL:  Thin female sitting comfortably in the exam room, looks well.  No cough at this time.  CENTRAL NERVOUS SYSTEM:  Alert and oriented x3.  Speech and ambulation  are normal.  HEENT:  Upper airway Mallampati class I.  NECK:  Supple.  No neck nodes.  No elevated JVP.  RESPIRATORY SYSTEM:  Air entry equal on both sides.  Normal breath  sounds.  CARDIOVASCULAR:  Normal heart sounds.  Regular rate and rhythm.  No  gallops, no murmurs.  ABDOMEN:  Soft, nontender.  No organomegaly.  Normal bowel sounds.  EXTREMITIES:  No edema, no cyanosis, no clubbing.  SKIN:  Intact.  MUSCULOSKELETAL:  No joint problems.  BACK:  No kyphosis, no scoliosis.   LABORATORY DATA:  I did another office spirometry today to see if her  obstruction had resolved along with resolution of her cough.  However,  she still has persistent obstruction.  FEV1 1.5 liters, 62% predicted.  FVC 2.09 liters, 68% predicted.  FEV1 percent is 72, again, predicted of  77.  Flow volume loop shows mild obstruction.  This was a good test  without any cough, and she completed 6 seconds in the exploratory  maneuver.   ASSESSMENT AND PLAN:  In summary, this is a nonsmoking woman who has had  recent cough for much of August of 2008 but that resolved on December 07, 2006 pretty much spontaneously.  However, spirometry during the time  of cough on November 26, 2006 and a full lung function test on November 26, 2006 showed mild obstruction.  Today on followup, even  though she is  asymptomatic, spirometry still shows mild-to-moderate amount of  obstruction.  Of note, the DLCO was normal on November 26, 2006.   I do not know what is causing her obstructive lung disease.  She is  essentially asymptomatic now.  I suspect she might have adult-onset  asthma or post-viral reactive airways.  Airway sarcoidosis is possible  but unlikey. I do not think this is emphysema because her DLCO was  normal.  This could be chronic bronchitis, but again, she has only had  cough for a month and then it resolved.  Again, I went through her  occupational history and social history but there is not any cigarette  smoke or fume exposure.   I recommended inhaled oral steroids and/or albuter for rescue but she  declined.  She said she wants to take an expectant approach and she will  watch out for symptoms of cough, wheezing, chest tightness, sputum  production, fever. If there is a problem, she will come directly to our  pulmonary clinic.  She will still see [place] again in January of 2009.     Brand Males, MD  Electronically Signed    MR/MedQ  DD: 12/23/2006  DT: 12/24/2006  Job #: [id number]   cc:   Hilliard Clark A. Loanne Drilling, MD

## 2010-08-13 NOTE — Letter (Signed)
November 26, 2006    Sean A. Loanne Drilling, [address] [place], [place] [address]   RE:  Lindsay Doyle, Lindsay Doyle  MRN:  [id number]  /  DOB:  1933-12-24   Dear Dr. Loanne Drilling:   CHIEF COMPLAINT:  Cough.   Thank you for referring Lindsay Doyle to our pulmonary clinic.  As you  know, she has had cough since October 30, 2006. This is the first time she  has had cough. It is severe. One day she woke up and realized she had  cough. Coughs is present throughout the day.  It starts early in the  morning, as soon as she wakes up, and persists for the rest of the day.  Cough sounds wet and she brings up one globful of white sputum every  hour.  At times the cough is so bad that she throws up.  She is  extremely embarrassed by the whole situation that she has avoided a lot  of social occasions and situations.  She believes that nothing gives her  relief including a  10-day course of doxycycline completed on November 20, 2006.  She is very frustrated by this situation, and is unable to  understand why she has picked up this cough.   She denies any fever, runny nose, dyspnea, postnasal drip, recent  infection, orthopnea, paroxysmal nocturnal dyspnea, edema, wheeze,  chills, or chest pain.   PAST MEDICAL HISTORY:  1. Mitral valve prolapse.  She has had this for a long time, but no      problems.  2. Hypertension.  She used to be on Toprol but this was discontinued      in August of this year, and then switched to metoprolol succinate      extended release 50 mg once daily.  In addition, she is on Hyzaar      which is losartan, and potassium since 2006.  3. She suffers from allergy or sinus troubles, but she says that she      has never been to a doctor for this.  She is unclear what exactly      she means by allergy or sinus trouble.  She says that all she has      is an occasional soreness in her sinus passages.  But really, she      denies any infection as a result or any actual trivial factors.   Past medical history is otherwise negative.   PAST SURGICAL HISTORY:  1. Status post tonsillectomy.  2. Status post hysterectomy in 1982.  3. Status post tubal ligation in 1975.  4. Status post cardiac catheterization in 1992 at [facility] in [place].  This was negative for any coronary artery      disease.   ALLERGIES:  She is allergic to [facility], it results in diarrhea.   MEDICATIONS:  She just completed a 10-day course of doxycycline.  Regular medications include:  1. Vitamin E 400 mg once daily.  2. Multivitamin once daily.  3. Calcium once daily.  4. B complex once daily.  5. Hyzaar since 2006.  6. Metoprolol succinate extended release 50 mg once daily since August      2008.   SOCIAL HISTORY:  She is married, lives with her children.  She has never  smoked.  Her husband has never been a smoker, either.  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  In the last 3  months she has traveled to [place], [place], and [place], [place].   FAMILY HISTORY:  Her mother had stomach cancer. Her sister, who lives in  [place], [place], suffers from sarcoidosis.   REVIEW OF SYSTEMS:  This is documented in detail in the questionnaire.  Significant review of systems includes productive cough and the sore  throat.   PHYSICAL EXAMINATION:  VITAL SIGNS:  Temperature 97.8, blood pressure  124/76, pulse of 86, oxygen saturation 99% on room air.  GENERAL EXAM:  A thin female seated comfortably in the exam room.  Looks  well.  Periodically coughs during the exam.  CENTRAL NERVOUS SYSTEM:  Alert and oriented x3.  Speech and ambulation  are normal.  HEENT:  Upper airway Mallampati classification class I.  NECK:  Supple, no neck nodes, no elevated JVP.  RESPIRATORY SYSTEM:  Air entry equal on both sides, normal breath  sounds.  CARDIOVASCULAR:  Normal heart sounds.  Regular rate and rhythm.  No  gallops, no murmurs.  ABDOMEN:  Soft,  nontender, no organomegaly.  Normal bowel sounds.  EXTREMITIES:  No edema, no cyanosis, no clubbing.  SKIN:  Intact.  MUSCULOSKELETAL SYSTEM:  No joint problems.  BACK EXAM:  No kyphosis, no scoliosis.   LABORATORY EVALUATION:  Chest x-ray from 2002 reports cardiomegaly.  Chest x-ray from November 13, 2006 also reports mild cardiomegaly and  chronic obstructive pulmonary disease. However, I personally was not  convinced that her chest x-ray was consistent with chronic obstructive  pulmonary disease. In addition, what is not mentioned in the report, is  that in both x-rays, I believer that the left diaphragm is elevated.   Pulmonary function tests done in the lab today shows FEV1 of 1.54 L 78%  predicted, FVC of 2.5 L 89% predicted, and FEV1/FVC ratio that is  reduced at 62%, and a total lung capacity of 4 L which is 84% predicted.  Adjusted DLCO of 17.4 mmHg/minute, which is 89% predicted.  The numbers  suggest very mild obstruction.  The flow volume loops shows that  completeion of the 6 second timed maneuver was accompanied by severe  cough during inspiration and expiration.   ASSESSMENT AND PLAN:  In summary, this is a pleasant 77 year old lady,  never-smoker, with no prior lung problems, but with a family history of  sarcoidosis, who has a cough for the last 1 month with white sputum  production.  Chest x-ray shows cardiomegaly and left diaphragm  elevation.  Pulmonary function tests show mild obstructive lung disease  but with normal DLCO and normal total lung capacity.  However,  performance of pulmonary function test was impaired by coughing.   I am unclear as to what is causing her cough.  Common possiblities  include chronic bronchitis, cough variant asthma, eosinophilic  bronchitis, gastroesophageal reflux disease, and postnasal drip.  Her  office spirometry suggests mild obstructive lung disease but I am unsure  if this test is reliable because of cough during testing.    Ms. Spanbauer wants to take a very simplistic approach to her treatment  plan.  Therefore, we she will start first with empiric treatment for  post nasal drip with budesonide nasal inhaler.  She will  take this for 2 weeks, and report to me again in the clinic.  I will  also get full pulmonary function tests.   Return in two weeks.    Sincerely,      Brand Males, MD  Electronically Signed  MR/MedQ  DD: 11/26/2006  DT: 11/27/2006  Job #: [id number]

## 2010-08-16 ENCOUNTER — Ambulatory Visit
Admission: RE | Admit: 2010-08-16 | Discharge: 2010-08-16 | Disposition: A | Discharge status: Home / Self Care | Payer: Medicare Other | Source: Ambulatory Visit | Attending: Orthopedic Surgery | Admitting: Orthopedic Surgery

## 2010-08-16 DIAGNOSIS — M533 Sacrococcygeal disorders, not elsewhere classified: Secondary | ICD-10-CM

## 2010-08-16 DIAGNOSIS — R102 Pelvic and perineal pain: Secondary | ICD-10-CM

## 2010-08-19 ENCOUNTER — Other Ambulatory Visit (INDEPENDENT_AMBULATORY_CARE_PROVIDER_SITE_OTHER): Payer: Medicare Other

## 2010-08-19 DIAGNOSIS — D649 Anemia, unspecified: Secondary | ICD-10-CM

## 2010-08-19 LAB — CBC WITH DIFFERENTIAL/PLATELET
Basophils Absolute: 0.1 10*3/uL (ref 0.0–0.1)
Eosinophils Absolute: 0.2 10*3/uL (ref 0.0–0.7)
Lymphocytes Relative: 46.5 % — ABNORMAL HIGH (ref 12.0–46.0)
MCHC: 34.5 g/dL (ref 30.0–36.0)
Monocytes Relative: 13.8 % — ABNORMAL HIGH (ref 3.0–12.0)
Neutrophils Relative %: 36.4 % — ABNORMAL LOW (ref 43.0–77.0)
Platelets: 200 10*3/uL (ref 150.0–400.0)
RDW: 17.6 % — ABNORMAL HIGH (ref 11.5–14.6)

## 2010-08-20 ENCOUNTER — Other Ambulatory Visit: Payer: Medicare Other

## 2010-08-20 ENCOUNTER — Ambulatory Visit: Payer: Medicare Other | Admitting: Gastroenterology

## 2010-08-20 ENCOUNTER — Other Ambulatory Visit: Payer: Self-pay | Admitting: Gastroenterology

## 2010-08-20 DIAGNOSIS — Z1289 Encounter for screening for malignant neoplasm of other sites: Secondary | ICD-10-CM

## 2010-08-20 LAB — HEMOCCULT SLIDES (X 3 CARDS)
OCCULT 1: NEGATIVE
OCCULT 3: NEGATIVE
OCCULT 4: NEGATIVE

## 2010-08-28 ENCOUNTER — Encounter: Payer: Self-pay | Admitting: Endocrinology

## 2010-08-28 ENCOUNTER — Ambulatory Visit (INDEPENDENT_AMBULATORY_CARE_PROVIDER_SITE_OTHER): Payer: Medicare Other | Admitting: Endocrinology

## 2010-08-28 VITALS — BP 128/78 | HR 91 | Temp 98.5°F | Ht 64.0 in | Wt 114.4 lb

## 2010-08-28 DIAGNOSIS — J069 Acute upper respiratory infection, unspecified: Secondary | ICD-10-CM

## 2010-08-28 MED ORDER — AMOXICILLIN 250 MG PO CAPS: 250.0000 mg | ORAL_CAPSULE | Freq: Three times a day (TID) | ORAL | Status: AC

## 2010-08-28 MED ORDER — BENZONATATE 100 MG PO CAPS: 100.0000 mg | ORAL_CAPSULE | Freq: Three times a day (TID) | ORAL | Status: AC | PRN

## 2010-08-28 NOTE — Patient Instructions (Addendum)
i have sent a prescription to your pharmacy for amoxicillin, and for non-drowsy cough pills.   I'll see you soon for your physical.

## 2010-08-28 NOTE — Progress Notes (Signed)
Subjective:    Patient ID: Lindsay Doyle, female    DOB: [birth year], 77 y.o.   MRN: [id number]  HPI Pt states 1 day of slight dry-quality cough in the chest, and assoc sore throat.  Past Medical History  Diagnosis Date  . HYPERCHOLESTEROLEMIA 09/17/2009  . HYPERLIPIDEMIA 03/09/2007  . MITRAL VALVE PROLAPSE 03/09/2007  . ANEMIA-IRON DEFICIENCY 03/09/2007  . LEUKOPENIA, MILD 05/30/2008  . HYPERTENSION 11/12/2006  . ASTHMA 01/18/2007  . COPD 03/09/2007  . OSTEOARTHRITIS 02/19/2009    Spinal  . OSTEOPOROSIS 11/12/2006  . COLONIC POLYPS, HX OF 03/09/2007  . HYPERGLYCEMIA 07/02/2007  . ASYMPTOMATIC POSTMENOPAUSAL STATUS 07/17/2008  . Unspecified acute inflammation of orbit 05/12/2007  . PSVT 05/03/2009  . CONTUSION, LEFT FOOT 01/10/2010  . Positive H. pylori test 10/1996  . WBC decreased     chronic    Past Surgical History  Procedure Date  . Abdominal hysterectomy   . Tonsillectomy   . Tubal ligation   . Cataract extraction   . Cardiac catheterization 1992    S/P in 1992 at [facility] in [place]. This was negative for any coronary artery disease  . Carotid duplex 08/29/2003  . Electrocardiogram 06/04/2006    History   Social History  . Marital Status: Married    Spouse Name: N/A    Number of Children: N/A  . Years of Education: N/A   Occupational History  .      retired   Social History Main Topics  . Smoking status: Never Smoker   . Smokeless tobacco: Not on file  . Alcohol Use: No  . Drug Use:   . Sexually Active:    Other Topics Concern  . Not on file   Social History Narrative  . No narrative on file    Current Outpatient Prescriptions on File Prior to Visit  Medication Sig Dispense Refill  . b complex vitamins tablet Take 1 tablet by mouth daily.        . Calcium Carbonate-Vitamin D (CALTRATE 600+D) 600-400 MG-UNIT per tablet Take 1 tablet by mouth daily.        [name] losartan-hydrochlorothiazide (HYZAAR) 50-12.5 MG per tablet Take 1 tablet by  mouth daily.        . metoprolol succinate (TOPROL-XL) 25 MG 24 hr tablet Take 25 mg by mouth daily.        . Multiple Vitamin (MULTIVITAMIN) tablet Take 1 tablet by mouth daily.        . Omega-3 Fatty Acids ([employer] NATURAL FISH OIL) 1000 MG CAPS Take 1 capsule by mouth daily.        . fluticasone (CUTIVATE) 0.05 % cream Apply topically 4 (four) times daily as needed. For rash         Allergies  Allergen Reactions  . Azithromycin   . Cefuroxime Axetil   . Clarithromycin   . Rofecoxib     REACTION: unspecified  . Tussionex Pennkinetic Er     Family History  Problem Relation Age of Onset  . Cancer Mother     stomach cancer  . Sarcoidosis Sister   . Cancer Brother     colon cancer  . Sarcoidosis Other     BP 128/78  Pulse 91  Temp(Src) 98.5 F (36.9 C) (Oral)  Ht 5\' 4"  (1.626 m)  Wt 114 lb 6.4 oz (51.891 kg)  BMI 19.64 kg/m2  SpO2 97%    Review of Systems Denies fever and earache.    Objective:  Physical Exam necessary head: no deformity eyes: no periorbital swelling, no proptosis external nose and ears are normal mouth: no lesion seen Both eac's and tm's are normal Neck - No masses or thyromegaly or limitation in range of motion LUNGS:  Clear to auscultation       Assessment & Plan:  Samuel [place], new

## 2010-09-02 ENCOUNTER — Ambulatory Visit: Payer: Medicare Other | Admitting: Gastroenterology

## 2010-09-10 ENCOUNTER — Telehealth: Payer: Self-pay | Admitting: *Deleted

## 2010-09-10 MED ORDER — CEPHALEXIN 500 MG PO CAPS: 500.0000 mg | ORAL_CAPSULE | Freq: Four times a day (QID) | ORAL | Status: DC

## 2010-09-10 NOTE — Telephone Encounter (Signed)
Pt was seen in the office last Wednesday with SAE for sore throat. Pt was given Amoxicillin and has finished course but still has sore throat and lost voice over the weekend. Pt advised next available appointment with another MD, but pt wants to know if something else can be sent in to her pharmacy for sore throat-please advise.

## 2010-09-10 NOTE — Telephone Encounter (Signed)
Pt informed via VM, left message for pt to callback with any questions or concerns.

## 2010-09-10 NOTE — Telephone Encounter (Signed)
Done per emr 

## 2010-09-13 ENCOUNTER — Emergency Department (HOSPITAL_COMMUNITY): Payer: Medicare Other

## 2010-09-13 ENCOUNTER — Emergency Department (HOSPITAL_COMMUNITY)
Admission: EM | Admit: 2010-09-13 | Discharge: 2010-09-13 | Disposition: A | Discharge status: Home / Self Care | Payer: Medicare Other | Attending: Emergency Medicine | Admitting: Emergency Medicine

## 2010-09-13 DIAGNOSIS — R002 Palpitations: Secondary | ICD-10-CM | POA: Insufficient documentation

## 2010-09-13 DIAGNOSIS — I10 Essential (primary) hypertension: Secondary | ICD-10-CM | POA: Insufficient documentation

## 2010-09-13 LAB — CBC
MCV: 92.4 fL (ref 78.0–100.0)
Platelets: 219 10*3/uL (ref 150–400)
RBC: 3.8 MIL/uL — ABNORMAL LOW (ref 3.87–5.11)
RDW: 15.2 % (ref 11.5–15.5)
WBC: 5.9 10*3/uL (ref 4.0–10.5)

## 2010-09-13 LAB — COMPREHENSIVE METABOLIC PANEL
AST: 48 U/L — ABNORMAL HIGH (ref 0–37)
Albumin: 3.7 g/dL (ref 3.5–5.2)
BUN: 14 mg/dL (ref 6–23)
Calcium: 9.2 mg/dL (ref 8.4–10.5)
Chloride: 93 mEq/L — ABNORMAL LOW (ref 96–112)
Creatinine, Ser: 0.71 mg/dL (ref 0.50–1.10)
Total Bilirubin: 0.3 mg/dL (ref 0.3–1.2)
Total Protein: 6.8 g/dL (ref 6.0–8.3)

## 2010-09-13 LAB — DIFFERENTIAL
Basophils Absolute: 0 10*3/uL (ref 0.0–0.1)
Eosinophils Absolute: 0.1 10*3/uL (ref 0.0–0.7)
Lymphocytes Relative: 27 % (ref 12–46)
Lymphs Abs: 1.6 10*3/uL (ref 0.7–4.0)
Neutrophils Relative %: 58 % (ref 43–77)

## 2010-09-13 LAB — CK TOTAL AND CKMB (NOT AT ARMC)
CK, MB: 2.4 ng/mL (ref 0.3–4.0)
Relative Index: 1.2 (ref 0.0–2.5)

## 2010-09-13 LAB — TROPONIN I: Troponin I: <0.3 ng/mL (ref <0.30)

## 2010-09-19 ENCOUNTER — Encounter: Payer: Medicare Other | Admitting: Endocrinology

## 2010-09-19 ENCOUNTER — Telehealth: Payer: Self-pay

## 2010-09-19 DIAGNOSIS — J309 Allergic rhinitis, unspecified: Secondary | ICD-10-CM

## 2010-09-19 NOTE — Telephone Encounter (Signed)
Pt advised and will expect a call from [facility] with appt info 

## 2010-09-19 NOTE — Telephone Encounter (Signed)
i need the reason to write down

## 2010-09-19 NOTE — Telephone Encounter (Signed)
Done

## 2010-09-19 NOTE — Telephone Encounter (Signed)
persistent sore throat and sinus drainage

## 2010-09-19 NOTE — Telephone Encounter (Signed)
Pt called requesting referral to Dr Constance Holster at [facility]

## 2010-09-20 ENCOUNTER — Ambulatory Visit (INDEPENDENT_AMBULATORY_CARE_PROVIDER_SITE_OTHER): Payer: Medicare Other | Admitting: Internal Medicine

## 2010-09-20 ENCOUNTER — Encounter: Payer: Self-pay | Admitting: Internal Medicine

## 2010-09-20 VITALS — BP 120/74 | HR 96 | Temp 98.2°F | Ht 64.0 in | Wt 115.8 lb

## 2010-09-20 DIAGNOSIS — J029 Acute pharyngitis, unspecified: Secondary | ICD-10-CM

## 2010-09-20 MED ORDER — LIDOCAINE VISCOUS 2 % MT SOLN: 20.0000 mL | OROMUCOSAL | Status: AC | PRN

## 2010-09-20 MED ORDER — OMEPRAZOLE 20 MG PO CPDR: 20.0000 mg | DELAYED_RELEASE_CAPSULE | Freq: Two times a day (BID) | ORAL | Status: DC

## 2010-09-20 NOTE — Patient Instructions (Signed)
It was good to see you today. Use omeprazole for acid and burning symptoms 2x/day x 2 weeks then as needed - Also lidocaine gel to help numb throat symptoms Your prescription(s) have been submitted to your pharmacy. Please take as directed and contact our office if you believe you are having problem(s) with the medication(s). we'll make referral to ENT (throat specialist) as requested. Our office will contact you regarding appointment(s) once made.

## 2010-09-20 NOTE — Progress Notes (Signed)
  Subjective:    Patient ID: Lindsay Doyle, female    DOB: 02-Jan-1934, 77 y.o.   MRN: [id number]  HPI complains of sore throat  Seen OV for same x 2 recently -  2 different antibiotics ineffective Not using H2B rx'd by ER for GERD (bottle zantac in pt purse) No fever Feels "sticking" in left side of thraot with swallow - but no regurgitation of dysphagia  PMH reviewed and updated today   Review of Systems  Constitutional: Positive for fatigue. Negative for fever.  HENT: Positive for sore throat and voice change.   Respiratory: Negative for shortness of breath.   Cardiovascular: Negative for chest pain.       Objective:   Physical Exam BP 120/74  Pulse 96  Temp(Src) 98.2 F (36.8 C) (Oral)  Ht 5\' 4"  (1.626 m)  Wt 115 lb 12.8 oz (52.527 kg)  BMI 19.88 kg/m2  SpO2 96% Physical Exam  Constitutional: She is oriented to person, place, and time. She appears well-developed and well-nourished. No distress. spouse at side HENT: Head: Normocephalic and atraumatic. Ears; B TMs ok, no erythema or effusion; Nose: Nose normal.  Mouth/Throat: Oropharynx is clear and moist. No oropharyngeal exudate.  Eyes: Conjunctivae and EOM are normal. Pupils are equal, round, and reactive to light. No scleral icterus.  Neck: Normal range of motion. Neck supple. No JVD present. No thyromegaly present.  Cardiovascular: Normal rate, regular rhythm and normal heart sounds.  No murmur heard. No BLE edema. Pulmonary/Chest: Effort normal and breath sounds normal. No respiratory distress. She has no wheezes.  Psychiatric: She has a normal mood and affect. Her behavior is normal. Judgment and thought content normal.   Lab Results  Component Value Date   WBC 5.9 09/13/2010   HGB 12.7 09/13/2010   HCT 35.1* 09/13/2010   PLT 219 09/13/2010   CHOL 251* 09/17/2009   TRIG 89.0 09/17/2009   HDL 93.70 09/17/2009   LDLDIRECT 146.3 09/17/2009   ALT 29 09/13/2010   AST 48* 09/13/2010   NA 131* 09/13/2010   K 4.3 09/13/2010     CL 93* 09/13/2010   CREATININE 0.71 09/13/2010   BUN 14 09/13/2010   CO2 29 09/13/2010   TSH 3.263 06/20/2010   INR 0.92 06/20/2010   HGBA1C 5.8 09/17/2009        Assessment & Plan:  sore throat/pharyngitis - not improved with abx, suspect esophagitis -  stop keflex and use PPI bid x 2 weeks - also symptomatic relief with viscous lidocaine - erx done -  will also refer to ENT at pt request

## 2010-10-22 ENCOUNTER — Other Ambulatory Visit (INDEPENDENT_AMBULATORY_CARE_PROVIDER_SITE_OTHER): Payer: Medicare Other

## 2010-10-22 ENCOUNTER — Encounter: Payer: Self-pay | Admitting: Endocrinology

## 2010-10-22 ENCOUNTER — Ambulatory Visit (INDEPENDENT_AMBULATORY_CARE_PROVIDER_SITE_OTHER): Payer: Medicare Other | Admitting: Endocrinology

## 2010-10-22 ENCOUNTER — Other Ambulatory Visit: Payer: Self-pay | Admitting: Endocrinology

## 2010-10-22 DIAGNOSIS — E7800 Pure hypercholesterolemia, unspecified: Secondary | ICD-10-CM

## 2010-10-22 DIAGNOSIS — Z0000 Encounter for general adult medical examination without abnormal findings: Secondary | ICD-10-CM

## 2010-10-22 DIAGNOSIS — K769 Liver disease, unspecified: Secondary | ICD-10-CM

## 2010-10-22 DIAGNOSIS — I10 Essential (primary) hypertension: Secondary | ICD-10-CM

## 2010-10-22 DIAGNOSIS — E785 Hyperlipidemia, unspecified: Secondary | ICD-10-CM

## 2010-10-22 DIAGNOSIS — K222 Esophageal obstruction: Secondary | ICD-10-CM | POA: Insufficient documentation

## 2010-10-22 DIAGNOSIS — N951 Menopausal and female climacteric states: Secondary | ICD-10-CM | POA: Insufficient documentation

## 2010-10-22 DIAGNOSIS — R7309 Other abnormal glucose: Secondary | ICD-10-CM

## 2010-10-22 LAB — HEPATIC FUNCTION PANEL
ALT: 19 U/L (ref 0–35)
Albumin: 4.6 g/dL (ref 3.5–5.2)
Total Protein: 7.9 g/dL (ref 6.0–8.3)

## 2010-10-22 LAB — URINALYSIS, ROUTINE W REFLEX MICROSCOPIC
Leukocytes, UA: NEGATIVE
Specific Gravity, Urine: <=1.005 (ref 1.000–1.030)
Urine Glucose: NEGATIVE
Urobilinogen, UA: 0.2 (ref 0.0–1.0)

## 2010-10-22 LAB — TSH: TSH: 1.95 u[IU]/mL (ref 0.35–5.50)

## 2010-10-22 LAB — BASIC METABOLIC PANEL
CO2: 31 mEq/L (ref 19–32)
Calcium: 9.8 mg/dL (ref 8.4–10.5)
Creatinine, Ser: 0.8 mg/dL (ref 0.4–1.2)
GFR: 89.86 mL/min (ref >60.00)
Glucose, Bld: 99 mg/dL (ref 70–99)
Sodium: 138 mEq/L (ref 135–145)

## 2010-10-22 LAB — LIPID PANEL
Cholesterol: 286 mg/dL — ABNORMAL HIGH (ref 0–200)
HDL: 89.5 mg/dL (ref >39.00)
Triglycerides: 131 mg/dL (ref 0.0–149.0)
VLDL: 26.2 mg/dL (ref 0.0–40.0)

## 2010-10-22 LAB — HEMOGLOBIN A1C: Hgb A1c MFr Bld: 5.8 % (ref 4.6–6.5)

## 2010-10-22 MED ORDER — METOPROLOL SUCCINATE ER 25 MG PO TB24: 25.0000 mg | ORAL_TABLET | Freq: Every day | ORAL | Status: DC

## 2010-10-22 NOTE — Progress Notes (Signed)
Subjective:    Patient ID: Lindsay Doyle, female    DOB: 01-30-1934, 77 y.o.   MRN: [id number]  HPI Subjective:   Patient here for Medicare annual wellness visit and management of other chronic and acute problems.     Risk factors: advanced age    82 of Physicians Providing Medical Care to Patient: Opthal: shapiro Gyn: grewal  Activities of Daily Living: In your present state of health, do you have any difficulty performing the following activities?:  Preparing food and eating?: No  Bathing yourself: No  Getting dressed: No  Using the toilet: No  Moving around from place to place: No  In the past year have you fallen or had a near fall?: No    Home Safety: Has smoke detector and wears seat belts. No firearms.   Diet and Exercise  Current exercise habits:  Pt says good Dietary issues discussed: pt reports a healthy diet   Depression Screen  Q1: Over the past two weeks, have you felt down, depressed or hopeless?no  Q2: Over the past two weeks, have you felt little interest or pleasure in doing things? no   The following portions of the patient's history were reviewed and updated as appropriate: allergies, current medications, past family history, past medical history, past social history, past surgical history and problem list.  Past Medical History  Diagnosis Date  . HYPERCHOLESTEROLEMIA 09/17/2009  . HYPERLIPIDEMIA 03/09/2007  . MITRAL VALVE PROLAPSE 03/09/2007  . ANEMIA-IRON DEFICIENCY 03/09/2007  . LEUKOPENIA, MILD 05/30/2008  . HYPERTENSION 11/12/2006  . ASTHMA 01/18/2007  . COPD 03/09/2007  . OSTEOARTHRITIS 02/19/2009    Spinal  . OSTEOPOROSIS 11/12/2006  . COLONIC POLYPS, HX OF 03/09/2007  . HYPERGLYCEMIA 07/02/2007  . ASYMPTOMATIC POSTMENOPAUSAL STATUS 07/17/2008  . PSVT 05/03/2009  . Positive H. pylori test 10/1996  . WBC decreased     chronic    Past Surgical History  Procedure Date  . Abdominal hysterectomy   . Tonsillectomy   . Tubal ligation   . Cataract  extraction   . Cardiac catheterization 1992    S/P in 1992 at [facility] in [place]. This was negative for any coronary artery disease  . Carotid duplex 08/29/2003  . Electrocardiogram 06/04/2006    History   Social History  . Marital Status: Married    Spouse Name: N/A    Number of Children: N/A  . Years of Education: N/A   Occupational History  .      retired   Social History Main Topics  . Smoking status: Never Smoker   . Smokeless tobacco: Not on file  . Alcohol Use: No  . Drug Use:   . Sexually Active:    Other Topics Concern  . Not on file   Social History Narrative  . No narrative on file    Current Outpatient Prescriptions on File Prior to Visit  Medication Sig Dispense Refill  . b complex vitamins tablet Take 1 tablet by mouth daily.        . Calcium Carbonate-Vitamin D (CALTRATE 600+D) 600-400 MG-UNIT per tablet Take 1 tablet by mouth daily.        . fluticasone (CUTIVATE) 0.05 % cream Apply topically 4 (four) times daily as needed. For rash       . losartan-hydrochlorothiazide (HYZAAR) 50-12.5 MG per tablet Take 1 tablet by mouth daily.        . metoprolol succinate (TOPROL-XL) 25 MG 24 hr tablet Take 25 mg by mouth  daily.        . Omega-3 Fatty Acids ([employer] NATURAL FISH OIL) 1000 MG CAPS Take 1 capsule by mouth daily.        [name] omeprazole (PRILOSEC) 20 MG capsule Take 1 capsule (20 mg total) by mouth 2 (two) times daily. X 2 weeks then as needed for burning symptoms  60 capsule  1  . Multiple Vitamin (MULTIVITAMIN) tablet Take 1 tablet by mouth daily.          Allergies  Allergen Reactions  . Azithromycin   . Cefuroxime Axetil   . Clarithromycin   . Rofecoxib     REACTION: unspecified  . Tussionex Pennkinetic Er     Family History  Problem Relation Age of Onset  . Cancer Mother     stomach cancer  . Sarcoidosis Sister   . Cancer Brother     colon cancer  . Sarcoidosis Other     BP 132/82  Pulse 78  Temp(Src) 97.4 F  (36.3 C) (Oral)  Ht 5\' 4"  (1.626 m)  Wt 115 lb 6.4 oz (52.345 kg)  BMI 19.81 kg/m2  SpO2 98%  Review of Systems  Denies hearing loss, and visual loss Objective:   Vision:  Sees opthalmologist Hearing: grossly normal Body mass index:  See vs page Msk: pt easily and quickly performs "get-up-and-go" from a sitting position Cognitive Impairment Assessment: cognition, memory and judgment appear normal.  remembers 3/3 at 5 minutes.  excellent recall.  can easily read and write a sentence.  alert and oriented x 3   Assessment:   Medicare wellness utd on preventive parameters    Plan:   During the course of the visit the patient was educated and counseled about appropriate screening and preventive services including:        Fall prevention   Screening mammography --gyn does Bone densitometry screening Diabetes screening Nutrition counseling   Vaccines / LABS Zostavax is declined   Patient Instructions (the written plan) was given to the patient.  Review of Systems     Objective:   Physical Exam   Assessment & Plan:  Denies chest pain and sob.  She has gained a few lbs SEPARATE EVALUATION FOLLOWS--EACH PROBLEM HERE IS NEW, NOT RESPONDING TO TREATMENT, OR POSES SIGNIFICANT RISK TO THE PATIENT'S HEALTH: HISTORY OF THE PRESENT ILLNESS: The state of at least three ongoing medical problems is addressed today: She takes bp meds as as rx'ed Pt has few years h/o mild fe-deficiency anemia.  W/u was neg She has hypercholesterolemia, but hdl has been high She also has h/o hyperglycemia. PAST MEDICAL HISTORY reviewed and up to date today REVIEW OF SYSTEMS: Denies chest pain and sob PHYSICAL EXAMINATION: Neck - No masses or thyromegaly or limitation in range of motion LUNGS:  Clear to auscultation HEART: Regular rate and rhythm without murmurs noted. Normal S1,S2.   Pulses: dorsalis pedis intact bilat.   Feet: no deformity.  no ulcer on the feet.  feet are of normal color and  temp.  no edema Neuro: sensation is intact to touch on the feet LAB/XRAY RESULTS: Lab Results  Component Value Date   WBC 5.9 09/13/2010   HGB 12.7 09/13/2010   HCT 35.1* 09/13/2010   PLT 219 09/13/2010   CHOL 286* 10/22/2010   TRIG 131.0 10/22/2010   HDL 89.50 10/22/2010   LDLDIRECT 165.2 10/22/2010   ALT 19 10/22/2010   AST 23 10/22/2010   NA 138 10/22/2010   K 4.4 10/22/2010   CL 102  10/22/2010   CREATININE 0.8 10/22/2010   BUN 11 10/22/2010   CO2 31 10/22/2010   TSH 1.95 10/22/2010   INR 0.92 06/20/2010   HGBA1C 5.8 10/22/2010  IMPRESSION: Hyperglycemia, stable Hypercholesterolemia, but she has elev hdl Htn, well-controlled Anemia, resolved PLAN: Same rx

## 2010-10-22 NOTE — Patient Instructions (Addendum)
blood tests are being ordered for you today.  please call [phone] to hear your test results.  You will be prompted to enter the 9-digit "MRN" number that appears at the top left of this page, followed by #.  Then you will hear the message. pending the test results, please continue the same medications for now please consider these measures for your health:  minimize alcohol.  do not use tobacco products.  have a colonoscopy at least every 10 years from age 50.  Women should have an annual mammogram from age 26.  keep firearms safely stored.  always use seat belts.  have working smoke alarms in your home.  see an eye doctor and dentist regularly.  never drive under the influence of alcohol or drugs (including prescription drugs).   please let me know what your wishes would be, if artificial life support measures should become necessary.  it is critically important to prevent falling down (keep floor areas well-lit, dry, and free of loose objects) Please return in 1 year Please schedule  "bone density" test. i changed the omeprazole to 40 mg daily.

## 2010-10-22 NOTE — Progress Notes (Signed)
This encounter was created in error - please disregard.

## 2011-01-23 ENCOUNTER — Encounter: Payer: Self-pay | Admitting: Endocrinology

## 2011-01-23 ENCOUNTER — Ambulatory Visit (INDEPENDENT_AMBULATORY_CARE_PROVIDER_SITE_OTHER): Payer: Medicare Other | Admitting: Endocrinology

## 2011-01-23 ENCOUNTER — Other Ambulatory Visit (INDEPENDENT_AMBULATORY_CARE_PROVIDER_SITE_OTHER): Payer: Medicare Other

## 2011-01-23 VITALS — BP 110/62 | HR 73 | Temp 97.4°F | Ht 64.0 in | Wt 115.0 lb

## 2011-01-23 DIAGNOSIS — D509 Iron deficiency anemia, unspecified: Secondary | ICD-10-CM

## 2011-01-23 LAB — CBC WITH DIFFERENTIAL/PLATELET
Eosinophils Absolute: 0.2 10*3/uL (ref 0.0–0.7)
Lymphocytes Relative: 44.3 % (ref 12.0–46.0)
MCHC: 34.3 g/dL (ref 30.0–36.0)
MCV: 100.3 fl — ABNORMAL HIGH (ref 78.0–100.0)
Monocytes Absolute: 0.5 10*3/uL (ref 0.1–1.0)
Neutrophils Relative %: 37.2 % — ABNORMAL LOW (ref 43.0–77.0)
Platelets: 256 10*3/uL (ref 150.0–400.0)
RBC: 3.47 Mil/uL — ABNORMAL LOW (ref 3.87–5.11)
WBC: 4 10*3/uL — ABNORMAL LOW (ref 4.5–10.5)

## 2011-01-23 LAB — IBC PANEL
Iron: 48 ug/dL (ref 42–145)
Saturation Ratios: 8.9 % — ABNORMAL LOW (ref 20.0–50.0)
Transferrin: 383.2 mg/dL — ABNORMAL HIGH (ref 212.0–360.0)

## 2011-01-23 NOTE — Progress Notes (Signed)
Subjective:    Patient ID: Lindsay Doyle, female    DOB: [birth year], 77 y.o.   MRN: [id number]  HPI Pt states she has few mos of intermittent moderate fatigue sensation at the shoulders, but no assoc loc.  When she has these sxs, she notices bp 100/60.   She takes toprol for mvp.   Pt states few mos of slight rash at the right leg, and assoc itching. Past Medical History  Diagnosis Date  . HYPERCHOLESTEROLEMIA 09/17/2009  . HYPERLIPIDEMIA 03/09/2007  . MITRAL VALVE PROLAPSE 03/09/2007  . ANEMIA-IRON DEFICIENCY 03/09/2007  . LEUKOPENIA, MILD 05/30/2008  . HYPERTENSION 11/12/2006  . ASTHMA 01/18/2007  . COPD 03/09/2007  . OSTEOARTHRITIS 02/19/2009    Spinal  . OSTEOPOROSIS 11/12/2006  . COLONIC POLYPS, HX OF 03/09/2007  . HYPERGLYCEMIA 07/02/2007  . ASYMPTOMATIC POSTMENOPAUSAL STATUS 07/17/2008  . PSVT 05/03/2009  . Positive H. pylori test 10/1996  . WBC decreased     chronic    Past Surgical History  Procedure Date  . Abdominal hysterectomy   . Tonsillectomy   . Tubal ligation   . Cataract extraction   . Cardiac catheterization 1992    S/P in 1992 at [facility] in [place]. This was negative for any coronary artery disease  . Carotid duplex 08/29/2003  . Electrocardiogram 06/04/2006    History   Social History  . Marital Status: Married    Spouse Name: N/A    Number of Children: N/A  . Years of Education: N/A   Occupational History  .      retired   Social History Main Topics  . Smoking status: Never Smoker   . Smokeless tobacco: Not on file  . Alcohol Use: No  . Drug Use:   . Sexually Active:    Other Topics Concern  . Not on file   Social History Narrative  . No narrative on file    Current Outpatient Prescriptions on File Prior to Visit  Medication Sig Dispense Refill  . b complex vitamins tablet Take 1 tablet by mouth daily.        . Calcium Carbonate-Vitamin D (CALTRATE 600+D) 600-400 MG-UNIT per tablet Take 1 tablet by mouth daily.         . fluticasone (CUTIVATE) 0.05 % cream Apply topically 4 (four) times daily as needed. For rash       . losartan-hydrochlorothiazide (HYZAAR) 50-12.5 MG per tablet 1/2 tab daily      . metoprolol succinate (TOPROL-XL) 25 MG 24 hr tablet Take 1 tablet (25 mg total) by mouth daily.  90 tablet  3  . Multiple Vitamin (MULTIVITAMIN) tablet Take 1 tablet by mouth daily.        . Omega-3 Fatty Acids ([employer] NATURAL FISH OIL) 1000 MG CAPS Take 1 capsule by mouth daily.        [name] omeprazole (PRILOSEC) 40 MG capsule Take 40 mg by mouth as needed.         Allergies  Allergen Reactions  . Azithromycin   . Cefuroxime Axetil   . Clarithromycin   . Rofecoxib     REACTION: unspecified  . Tussionex Pennkinetic Er     Family History  Problem Relation Age of Onset  . Cancer Mother     stomach cancer  . Sarcoidosis Sister   . Cancer Brother     colon cancer  . Sarcoidosis Other     BP 110/62  Pulse 73  Temp(Src) 97.4 F (36.3 C) (Oral)  Ht 5\' 4"  (1.626 m)  Wt 115 lb (52.164 kg)  BMI 19.74 kg/m2  SpO2 99%  Review of Systems Denies chest pain and sob.      Objective:   Physical Exam VITAL SIGNS:  See vs page GENERAL: no distress Right leg: 2 cm red eczematous area  Lab Results  Component Value Date   WBC 4.0* 01/23/2011   HGB 11.9* 01/23/2011   HCT 34.8* 01/23/2011   MCV 100.3* 01/23/2011   PLT 256.0 01/23/2011      Assessment & Plan:  Htn, with situational component Skin rash, new, uncertain etiology fe-deficiency anemia, uncertain etiology

## 2011-01-23 NOTE — Patient Instructions (Addendum)
Reduce the losartan-hct to 1/2 pill per day. blood tests are being requested for you today.  please call [phone] to hear your test results.  You will be prompted to enter the 9-digit "MRN" number that appears at the top left of this page, followed by #.  Then you will hear the message. Call if you want to see a dermatology specialist.   (update: i left message on phone-tree:   Take fe 1/day)

## 2011-03-07 ENCOUNTER — Telehealth: Payer: Self-pay | Admitting: *Deleted

## 2011-03-07 NOTE — Telephone Encounter (Signed)
Pt called to inquire when last flu shot was given and to possibly schedule an appointment for vaccine. Left message for pt to callback office.

## 2011-03-07 NOTE — Telephone Encounter (Signed)
Appointment scheduled for 03/10/2011.

## 2011-03-10 ENCOUNTER — Ambulatory Visit (INDEPENDENT_AMBULATORY_CARE_PROVIDER_SITE_OTHER): Payer: Medicare Other | Admitting: *Deleted

## 2011-03-10 DIAGNOSIS — Z23 Encounter for immunization: Secondary | ICD-10-CM

## 2011-03-14 ENCOUNTER — Other Ambulatory Visit: Payer: Self-pay | Admitting: Endocrinology

## 2011-03-14 DIAGNOSIS — D509 Iron deficiency anemia, unspecified: Secondary | ICD-10-CM

## 2011-03-21 ENCOUNTER — Telehealth: Payer: Self-pay | Admitting: Hematology and Oncology

## 2011-03-21 NOTE — Telephone Encounter (Signed)
S/w pt today re appt for 1/16 @ 10 am w/LO.

## 2011-03-27 ENCOUNTER — Telehealth: Payer: Self-pay

## 2011-03-27 ENCOUNTER — Telehealth: Payer: Self-pay | Admitting: Hematology and Oncology

## 2011-03-27 NOTE — Telephone Encounter (Signed)
Pt called concerned about recent blood pressure reading. Pt has been checking BP daily and her reading are; 97/61 113/69 104/63 112/69 100/66 which she says causes her to be extremely weak. Pt will contact hematologist for advisement since SAE is not in the office.

## 2011-03-27 NOTE — Telephone Encounter (Signed)
Referred by Dr. Loanne Drilling Dx- IDA

## 2011-04-03 ENCOUNTER — Telehealth: Payer: Self-pay | Admitting: Hematology and Oncology

## 2011-04-03 ENCOUNTER — Ambulatory Visit: Payer: Medicare Other

## 2011-04-03 ENCOUNTER — Ambulatory Visit (HOSPITAL_BASED_OUTPATIENT_CLINIC_OR_DEPARTMENT_OTHER): Payer: Medicare Other

## 2011-04-03 ENCOUNTER — Encounter: Payer: Self-pay | Admitting: Hematology and Oncology

## 2011-04-03 ENCOUNTER — Other Ambulatory Visit: Payer: Self-pay | Admitting: Hematology and Oncology

## 2011-04-03 ENCOUNTER — Ambulatory Visit (HOSPITAL_BASED_OUTPATIENT_CLINIC_OR_DEPARTMENT_OTHER): Payer: Medicare Other | Admitting: Hematology and Oncology

## 2011-04-03 VITALS — BP 146/89 | HR 108 | Temp 97.1°F | Ht 64.0 in | Wt 114.2 lb

## 2011-04-03 DIAGNOSIS — D539 Nutritional anemia, unspecified: Secondary | ICD-10-CM

## 2011-04-03 DIAGNOSIS — D649 Anemia, unspecified: Secondary | ICD-10-CM

## 2011-04-03 DIAGNOSIS — N390 Urinary tract infection, site not specified: Secondary | ICD-10-CM | POA: Diagnosis not present

## 2011-04-03 DIAGNOSIS — R5381 Other malaise: Secondary | ICD-10-CM

## 2011-04-03 DIAGNOSIS — D509 Iron deficiency anemia, unspecified: Secondary | ICD-10-CM | POA: Diagnosis not present

## 2011-04-03 DIAGNOSIS — R5383 Other fatigue: Secondary | ICD-10-CM | POA: Diagnosis not present

## 2011-04-03 LAB — URINALYSIS, MICROSCOPIC - CHCC
Bilirubin (Urine): NEGATIVE
Ketones: NEGATIVE mg/dL
Specific Gravity, Urine: 1.01 (ref 1.003–1.035)
pH: 7 (ref 4.6–8.0)

## 2011-04-03 NOTE — Progress Notes (Signed)
CC:   Sean A. Loanne Drilling, MD       Dian Queen, MD       Erskine Emery, MD   IDENTIFYING STATEMENT:  The patient is a 78 year old woman, seen at request of Dr. Loanne Drilling, with anemia.  HISTORY OF PRESENT ILLNESS:  The patient reports a history of chronic iron-deficiency anemia.  In March of last year she had presented to [facility] in [place] with fatigue and shortness of breath.  She was found to have a hemoglobin of 7.  She also complained of dark tarry stools.  She received 3 units of packed red blood cells. She then self-discharged and presented to [facility] ER.  She was admitted and underwent a GI workup.  This included an endoscopy on 06/18/2010 that showed a Schatzki ring and mild gastritis, for which she was placed on omeprazole.  Hemoccult at that admission was negative.  A colonoscopy on 06/22/2010 was unremarkable.  Following discharge the patient then underwent small-bowel endoscopy with Dr. Deatra Ina that revealed no source of bleeding.  The patient was subsequently seen by Dr. Loanne Drilling on 01/23/2011.  She reported ongoing fatigue.  She denied overt bleeding.  He obtained blood work that noted the following:  01/23/2011:  White cell count 4, hemoglobin 11.9, hematocrit 34.8, platelets 256, MCV 100.3.  Iron 48, saturation 8.9, transferrin 283. Previous CBC on 09/13/2010:  White cell count 5.9, hemoglobin 12.7, hematocrit 35.1, platelets 219. 06/22/2010:  White count 9.4, hemoglobin 11.2, hematocrit 32.8, platelets 150.  The patient denies pica.  She denies melena and hematuria.  She denies weight loss.  She eats a well-balanced diet.  She denies pain.  PAST MEDICAL HISTORY:  Hyperlipidemia, leukopenia, anemia, hypertension, allergic rhinitis, asthma, COPD, osteoarthritis, history of Schatzki ring.  PAST SURGICAL HISTORY:  Status post total abdominal hysterectomy, status post tonsillectomy, status post tubal ligation.  ALLERGIES:  Azithromycin,  cefuroxime, clarithromycin, Tussionex.  MEDICATIONS:  Calcium carbonate 1 tab daily, iron 65 mg daily, Cutivate cream 0.05% as needed, losartan/hydrochlorothiazide 50/12.5 daily, Toprol-XL 25 mg daily, multivitamin daily, omega-3 fatty acids 1 capsule daily, Prilosec 40 mg as needed.  SOCIAL HISTORY:  The patient is married with 1 son.  She denies alcohol or tobacco use.  She is retired from [employer].  FAMILY HISTORY:  Negative for oncologic or hematologic malignancies, specifically anemia.  REVIEW OF SYSTEMS:  Up-to-date with annual physicals, with Dr. Renato Shin.  Denies having fever, chills, night sweats, anorexia, weight loss.  Admits to marked fatigue but is able to carry out most of activities of daily living.  GI:  Denies nausea, vomiting, abdominal pain, diarrhea, melena, hematochezia.  GU:  Denies dysuria, hematuria, nocturia, frequency.  Cardiovascular:  Denies chest pain, PND, orthopnea, ankle swelling.  Respiratory:  Denies cough, hemoptysis, wheeze, shortness of breath.  Skin:  Denies bruising or bleeding. Musculoskeletal:  Denies joint aches, muscle pains.  CNS:  Denies headaches, vision change, extremity weakness.  The rest of the review of systems is negative.  PHYSICAL EXAMINATION:  The patient is alert and oriented x3.  Vitals: Pulse 108, blood pressure 146/89, temperature 97.1, respirations 18, weight 114 pounds.  HEENT:  Head is atraumatic, normocephalic.  Sclerae anicteric.  Pupils equal, round and reactive to light.  Mouth moist without ulcerations, thrush or lesions.  Neck:  Supple.  No adenopathy. Chest:  Clear.  CVS:  Unremarkable.  Abdomen:  Soft.  No masses.  No hepatosplenomegaly.  Bowel sounds present.  Extremities:  No calf tenderness.  Pulses present and symmetrical.  Lymph Nodes:  No adenopathy.  CNS:  Nonfocal.  IMPRESSION AND PLAN:  Mrs. Duby is a 78 year old woman with a history of iron-deficiency  anemia.  She has had documented tarry stools.  A full GI workup in March of last year was unremarkable, with no evidence of source for bleeding noted.  At that time the patient received 3 units of packed red blood cells, and since that time the patient has not noted any bleeding, but continues to have fatigue.  Last labs performed in Dr. Cordelia Pen office in October 2012 had noted low iron.  She was placed on OTC iron.  However, her symptoms have not improved.  She may be, indeed, a candidate for IV iron but before we do so, I would like for her to repeat the following labs:  She will obtain a CBC with diff, comprehensive metabolic panel, and repeat anemia panel to include iron, ferritin and TIBC.  Also note that her MCV was slightly elevated, so we will obtain B12 and serum folate levels.  Rule out hemolysis with Coombs haptoglobin.  We will also obtain an SPEP with IFE to screen for myelodysplasias.  We will obtain a urinalysis to rule out hematuria.  I will be telephoning Mrs. Bouchie with the upcoming results.  She understands that if her ferritin stores are indeed low, she has been scheduled to receive IV iron next week.  If symptoms continue, I have suggested that she be seen in the emergency room.  We reviewed her previous blood work in detail.  We spent some time also reviewing the current causes and treatment for anemia.  She and her husband had a number of questions, which were answered.  I spent more than half the time coordinating care.    ______________________________ Nira Retort, M.D. LIO/MEDQ  D:  04/03/2011  T:  04/03/2011  Job:  [id number]

## 2011-04-03 NOTE — Progress Notes (Signed)
This office note has been dictated.

## 2011-04-03 NOTE — Telephone Encounter (Signed)
l/m on home # with appt time for 04/11/11 as I had advised them I would     aom

## 2011-04-03 NOTE — Telephone Encounter (Signed)
appt made for 3.2013 for lab and md,per dr lo we will do the iv on 1/11 not 1/7-1/10 as there is not a time avail.  i will call the pt with the time for the 11th and printed the appt for 05/2011.    aom

## 2011-04-03 NOTE — Progress Notes (Signed)
Primary Care MD- Dr. Renato Shin  OBGYN- Dr. Helane Rima  Spouse- Drue Novel, Sr

## 2011-04-04 ENCOUNTER — Other Ambulatory Visit: Payer: Self-pay | Admitting: *Deleted

## 2011-04-04 ENCOUNTER — Other Ambulatory Visit: Payer: Self-pay | Admitting: Lab

## 2011-04-04 DIAGNOSIS — N390 Urinary tract infection, site not specified: Secondary | ICD-10-CM

## 2011-04-04 DIAGNOSIS — D509 Iron deficiency anemia, unspecified: Secondary | ICD-10-CM

## 2011-04-06 LAB — URINE CULTURE

## 2011-04-07 LAB — SPEP & IFE WITH QIG
Alpha-1-Globulin: 6.9 % — ABNORMAL HIGH (ref 2.9–4.9)
Alpha-2-Globulin: 12.2 % — ABNORMAL HIGH (ref 7.1–11.8)
Beta 2: 5.5 % (ref 3.2–6.5)
Gamma Globulin: 13.5 % (ref 11.1–18.8)
IgG (Immunoglobin G), Serum: 1310 mg/dL (ref 690–1700)
IgM, Serum: 115 mg/dL (ref 52–322)

## 2011-04-07 LAB — DIRECT ANTIGLOBULIN TEST (NOT AT ARMC): DAT IgG: NEGATIVE

## 2011-04-08 ENCOUNTER — Other Ambulatory Visit: Payer: Self-pay | Admitting: *Deleted

## 2011-04-08 ENCOUNTER — Other Ambulatory Visit (HOSPITAL_COMMUNITY): Payer: Self-pay | Admitting: Hematology and Oncology

## 2011-04-08 ENCOUNTER — Telehealth: Payer: Self-pay | Admitting: *Deleted

## 2011-04-08 ENCOUNTER — Other Ambulatory Visit: Payer: Self-pay | Admitting: Hematology and Oncology

## 2011-04-08 DIAGNOSIS — D509 Iron deficiency anemia, unspecified: Secondary | ICD-10-CM

## 2011-04-08 MED ORDER — SODIUM CHLORIDE 0.9 % IV SOLN
25.0000 mg | Freq: Once | INTRAVENOUS | Status: DC
  Filled 2011-04-08: qty 0.5

## 2011-04-08 MED ORDER — DIPHENHYDRAMINE HCL 25 MG PO CAPS: 25.0000 mg | ORAL_CAPSULE | Freq: Once | ORAL | Status: DC

## 2011-04-08 MED ORDER — SODIUM CHLORIDE 0.9 % IV SOLN: 1016.0000 mg | Freq: Once | INTRAVENOUS | Status: DC

## 2011-04-08 MED ORDER — ACETAMINOPHEN 325 MG PO TABS: 650.0000 mg | ORAL_TABLET | Freq: Once | ORAL | Status: DC

## 2011-04-08 NOTE — Telephone Encounter (Signed)
Spoke with pt at home and informed pt re:   Urine culture negative; however, pt had blood in urine.   Informed pt that md would refer pt to urologist if pt agreed.   Pt stated yes. Gave pt date and time for iron infusion on 04/09/11 at short stay dept.  At 0730 am.   Pt voiced understanding.

## 2011-04-09 ENCOUNTER — Telehealth: Payer: Self-pay | Admitting: Hematology and Oncology

## 2011-04-09 ENCOUNTER — Encounter (HOSPITAL_COMMUNITY)
Admission: RE | Admit: 2011-04-09 | Discharge: 2011-04-09 | Disposition: A | Discharge status: Home / Self Care | Payer: Medicare Other | Source: Ambulatory Visit | Attending: Hematology and Oncology | Admitting: Hematology and Oncology

## 2011-04-09 ENCOUNTER — Encounter (HOSPITAL_COMMUNITY): Payer: Self-pay

## 2011-04-09 DIAGNOSIS — D649 Anemia, unspecified: Secondary | ICD-10-CM | POA: Diagnosis not present

## 2011-04-09 MED ORDER — SODIUM CHLORIDE 0.9 % IV SOLN: 50.0000 mg | Freq: Once | INTRAVENOUS | Status: DC

## 2011-04-09 MED ORDER — SODIUM CHLORIDE 0.9 % IV SOLN
INTRAVENOUS | Status: AC
  Administered 2011-04-09: 09:00:00 via INTRAVENOUS

## 2011-04-09 MED ORDER — ACETAMINOPHEN 325 MG PO TABS
650.0000 mg | ORAL_TABLET | Freq: Once | ORAL | Status: AC
  Administered 2011-04-09: 650 mg via ORAL

## 2011-04-09 MED ORDER — SODIUM CHLORIDE 0.9 % IV SOLN
25.0000 mg | INTRAVENOUS | Status: AC
  Administered 2011-04-09: 25 mg via INTRAVENOUS
  Filled 2011-04-09: qty 0.5

## 2011-04-09 MED ORDER — DIPHENHYDRAMINE HCL 25 MG PO CAPS
25.0000 mg | ORAL_CAPSULE | Freq: Once | ORAL | Status: AC
  Administered 2011-04-09: 25 mg via ORAL
  Filled 2011-04-09: qty 1

## 2011-04-09 MED ORDER — SODIUM CHLORIDE 0.9 % IV SOLN
1000.0000 mg | Freq: Once | INTRAVENOUS | Status: AC
  Administered 2011-04-09 (×2): 1000 mg via INTRAVENOUS
  Filled 2011-04-09: qty 20

## 2011-04-09 NOTE — Telephone Encounter (Signed)
S/w pt today re appt for 2/18 w/dr wrenn @ 2:45 pm (ms [place]). Pt has march appts and per pt iron she has @ the hosp today takes place of the inf here on 1/11. Lm for michelle.

## 2011-04-09 NOTE — Progress Notes (Signed)
Tolerated infusion well. No signs at adverse reaction noted. Reviewed all home care instructions

## 2011-04-11 ENCOUNTER — Ambulatory Visit: Payer: Medicare Other

## 2011-04-11 ENCOUNTER — Telehealth: Payer: Self-pay

## 2011-04-11 NOTE — Telephone Encounter (Signed)
Pt informed of MD's advisement. 

## 2011-04-11 NOTE — Telephone Encounter (Signed)
Stop losartan-hctz

## 2011-04-11 NOTE — Telephone Encounter (Signed)
Pt called to report her blood pressure to MD - 93/55 today. Pt states that her BP is usually around 110/70. Pt is currently taking 1/2 tab of Hyzaar 50-12.5 mg and Metoprolol 25 mg

## 2011-04-16 ENCOUNTER — Ambulatory Visit: Payer: Medicare Other

## 2011-04-16 ENCOUNTER — Ambulatory Visit: Payer: Medicare Other | Admitting: Hematology and Oncology

## 2011-05-05 ENCOUNTER — Ambulatory Visit (INDEPENDENT_AMBULATORY_CARE_PROVIDER_SITE_OTHER): Payer: Medicare Other | Admitting: Endocrinology

## 2011-05-05 ENCOUNTER — Encounter: Payer: Self-pay | Admitting: Endocrinology

## 2011-05-05 VITALS — BP 112/64 | HR 108 | Temp 98.2°F | Ht 64.0 in | Wt 116.0 lb

## 2011-05-05 DIAGNOSIS — J069 Acute upper respiratory infection, unspecified: Secondary | ICD-10-CM

## 2011-05-05 MED ORDER — CEPHALEXIN 500 MG PO CAPS: 500.0000 mg | ORAL_CAPSULE | Freq: Three times a day (TID) | ORAL | Status: AC

## 2011-05-05 NOTE — Patient Instructions (Addendum)
i have sent a prescription to your pharmacy, for an antibiotic I hope you feel better soon.  If you don't feel better by next week, please call back. Loratadine-d (non-prescription) will help your congestion.

## 2011-05-05 NOTE — Progress Notes (Signed)
Subjective:    Patient ID: Lindsay Doyle, female    DOB: 1933-04-19, 78 y.o.   MRN: [id number]  HPI Pt states few days of mild pain throughout the head, and assoc sore throat.  No earache.   Past Medical History  Diagnosis Date  . HYPERCHOLESTEROLEMIA 09/17/2009  . HYPERLIPIDEMIA 03/09/2007  . MITRAL VALVE PROLAPSE 03/09/2007  . ANEMIA-IRON DEFICIENCY 03/09/2007  . LEUKOPENIA, MILD 05/30/2008  . HYPERTENSION 11/12/2006  . COPD 03/09/2007  . OSTEOARTHRITIS 02/19/2009    Spinal  . OSTEOPOROSIS 11/12/2006  . COLONIC POLYPS, HX OF 03/09/2007  . HYPERGLYCEMIA 07/02/2007  . ASYMPTOMATIC POSTMENOPAUSAL STATUS 07/17/2008  . PSVT 05/03/2009  . Positive H. pylori test 10/1996  . WBC decreased     chronic  . Cataract   . Blood transfusion     3 transfusions    Past Surgical History  Procedure Date  . Abdominal hysterectomy   . Tonsillectomy   . Tubal ligation   . Cataract extraction   . Cardiac catheterization 1992    S/P in 1992 at [facility] in [place]. This was negative for any coronary artery disease  . Carotid duplex 08/29/2003  . Electrocardiogram 06/04/2006  . Ceasarean     History   Social History  . Marital Status: Married    Spouse Name: N/A    Number of Children: N/A  . Years of Education: N/A   Occupational History  .      retired   Social History Main Topics  . Smoking status: Never Smoker   . Smokeless tobacco: Not on file  . Alcohol Use: No  . Drug Use:   . Sexually Active:    Other Topics Concern  . Not on file   Social History Narrative  . No narrative on file    Current Outpatient Prescriptions on File Prior to Visit  Medication Sig Dispense Refill  . b complex vitamins tablet Take 1 tablet by mouth daily.        . Calcium Carbonate-Vitamin D (CALTRATE 600+D) 600-400 MG-UNIT per tablet Take 1 tablet by mouth daily.        . ferrous sulfate 325 (65 FE) MG tablet Take 325 mg by mouth daily with breakfast.        . fluticasone  (CUTIVATE) 0.05 % cream Apply topically 4 (four) times daily as needed. For rash       . metoprolol succinate (TOPROL-XL) 25 MG 24 hr tablet Take 1 tablet (25 mg total) by mouth daily.  90 tablet  3  . Multiple Vitamin (MULTIVITAMIN) tablet Take 1 tablet by mouth daily.        . Omega-3 Fatty Acids ([employer] NATURAL FISH OIL) 1000 MG CAPS Take 1 capsule by mouth daily.        [name] omeprazole (PRILOSEC) 40 MG capsule Take 40 mg by mouth as needed.       [name] losartan-hydrochlorothiazide (HYZAAR) 50-12.5 MG per tablet 1/2 tab daily        Allergies  Allergen Reactions  . Azithromycin   . Cefuroxime Axetil   . Clarithromycin   . Rofecoxib     REACTION: unspecified  . Tussionex Pennkinetic Er     Family History  Problem Relation Age of Onset  . Cancer Mother     stomach cancer  . Sarcoidosis Sister   . Cancer Brother     colon cancer  . Sarcoidosis Other     BP 112/64  Pulse 108  Temp(Src)  98.2 F (36.8 C) (Oral)  Ht 5\' 4"  (1.626 m)  Wt 116 lb (52.617 kg)  BMI 19.91 kg/m2  SpO2 98%  Review of Systems Denies cough and     Objective:   Physical Exam VITAL SIGNS:  See vs page GENERAL: no distress head: no deformity eyes: no periorbital swelling, no proptosis external nose and ears are normal mouth: no lesion seen Both tm's are red     Assessment & Plan:  URI, new

## 2011-05-20 ENCOUNTER — Encounter: Payer: Self-pay | Admitting: Endocrinology

## 2011-05-20 ENCOUNTER — Ambulatory Visit (INDEPENDENT_AMBULATORY_CARE_PROVIDER_SITE_OTHER): Payer: Medicare Other | Admitting: Endocrinology

## 2011-05-20 DIAGNOSIS — I10 Essential (primary) hypertension: Secondary | ICD-10-CM | POA: Diagnosis not present

## 2011-05-20 DIAGNOSIS — J111 Influenza due to unidentified influenza virus with other respiratory manifestations: Secondary | ICD-10-CM

## 2011-05-20 MED ORDER — OSELTAMIVIR PHOSPHATE 75 MG PO CAPS: 75.0000 mg | ORAL_CAPSULE | Freq: Two times a day (BID) | ORAL | Status: AC

## 2011-05-20 NOTE — Progress Notes (Signed)
Subjective:    Patient ID: Lindsay Doyle, female    DOB: 10-Jan-1934, 78 y.o.   MRN: [id number]  HPI Pt says she recovered from the illness for which she was seen 2 weeks ago.  However, she now has few days of moderate myalgias throughout he body.  She has assoc chills.   She says her blood pressure was slightly low yesterday. Past Medical History  Diagnosis Date  . HYPERCHOLESTEROLEMIA 09/17/2009  . HYPERLIPIDEMIA 03/09/2007  . MITRAL VALVE PROLAPSE 03/09/2007  . ANEMIA-IRON DEFICIENCY 03/09/2007  . LEUKOPENIA, MILD 05/30/2008  . HYPERTENSION 11/12/2006  . COPD 03/09/2007  . OSTEOARTHRITIS 02/19/2009    Spinal  . OSTEOPOROSIS 11/12/2006  . COLONIC POLYPS, HX OF 03/09/2007  . HYPERGLYCEMIA 07/02/2007  . ASYMPTOMATIC POSTMENOPAUSAL STATUS 07/17/2008  . PSVT 05/03/2009  . Positive H. pylori test 10/1996  . WBC decreased     chronic  . Cataract   . Blood transfusion     3 transfusions    Past Surgical History  Procedure Date  . Abdominal hysterectomy   . Tonsillectomy   . Tubal ligation   . Cataract extraction   . Cardiac catheterization 1992    S/P in 1992 at [facility] in [place]. This was negative for any coronary artery disease  . Carotid duplex 08/29/2003  . Electrocardiogram 06/04/2006  . Ceasarean     History   Social History  . Marital Status: Married    Spouse Name: N/A    Number of Children: N/A  . Years of Education: N/A   Occupational History  .      retired   Social History Main Topics  . Smoking status: Never Smoker   . Smokeless tobacco: Not on file  . Alcohol Use: No  . Drug Use:   . Sexually Active:    Other Topics Concern  . Not on file   Social History Narrative  . No narrative on file    Current Outpatient Prescriptions on File Prior to Visit  Medication Sig Dispense Refill  . b complex vitamins tablet Take 1 tablet by mouth daily.        . Calcium Carbonate-Vitamin D (CALTRATE 600+D) 600-400 MG-UNIT per tablet Take 1  tablet by mouth daily.        . fluticasone (CUTIVATE) 0.05 % cream Apply topically 4 (four) times daily as needed. For rash       . Multiple Vitamin (MULTIVITAMIN) tablet Take 1 tablet by mouth daily.        . Omega-3 Fatty Acids ([employer] NATURAL FISH OIL) 1000 MG CAPS Take 1 capsule by mouth daily.          Allergies  Allergen Reactions  . Azithromycin   . Cefuroxime Axetil   . Clarithromycin   . Rofecoxib     REACTION: unspecified  . Tussionex Pennkinetic Er     Family History  Problem Relation Age of Onset  . Cancer Mother     stomach cancer  . Sarcoidosis Sister   . Cancer Brother     colon cancer  . Sarcoidosis Other     BP 118/68  Pulse 110  Temp(Src) 98.9 F (37.2 C) (Oral)  Wt 118 lb (53.524 kg)  SpO2 99%    Review of Systems Denies loc.  She is uncertain if she has fever.  No sore throat, abd pain, cough, or earache.      Objective:   Physical Exam VITAL SIGNS:  See vs page GENERAL:  no distress LUNGS:  Clear to auscultation HEART:  Regular rate and rhythm without murmurs noted. Normal S1,S2.   ABDOMEN: abdomen is soft, nontender.  no hepatosplenomegaly.   not distended.  no hernia      Assessment & Plan:  Htn, overcontrolled Acute illness, ? Flu.  New.

## 2011-05-20 NOTE — Patient Instructions (Addendum)
i have sent a prescription to your pharmacy, for a pill against the flu Skip a few days of the metoprolol, then resume it t 1/2 pill per day.   Take tylenol, and drink plenty of fluids I hope you feel better soon.  If you don't feel better by next week, please call back.

## 2011-06-02 ENCOUNTER — Other Ambulatory Visit (HOSPITAL_BASED_OUTPATIENT_CLINIC_OR_DEPARTMENT_OTHER): Payer: Medicare Other | Admitting: Lab

## 2011-06-02 DIAGNOSIS — D509 Iron deficiency anemia, unspecified: Secondary | ICD-10-CM

## 2011-06-02 DIAGNOSIS — D649 Anemia, unspecified: Secondary | ICD-10-CM | POA: Diagnosis not present

## 2011-06-02 DIAGNOSIS — D539 Nutritional anemia, unspecified: Secondary | ICD-10-CM

## 2011-06-02 LAB — IRON AND TIBC
%SAT: 28 % (ref 20–55)
Iron: 86 ug/dL (ref 42–145)
UIBC: 224 ug/dL (ref 125–400)

## 2011-06-02 LAB — COMPREHENSIVE METABOLIC PANEL
ALT: 22 U/L (ref 0–35)
AST: 17 U/L (ref 0–37)
Alkaline Phosphatase: 71 U/L (ref 39–117)
BUN: 22 mg/dL (ref 6–23)
Calcium: 10 mg/dL (ref 8.4–10.5)
Chloride: 105 mEq/L (ref 96–112)
Creatinine, Ser: 1.45 mg/dL — ABNORMAL HIGH (ref 0.50–1.10)
Total Bilirubin: 0.4 mg/dL (ref 0.3–1.2)

## 2011-06-02 LAB — CBC WITH DIFFERENTIAL/PLATELET
BASO%: 0.7 % (ref 0.0–2.0)
Basophils Absolute: 0 10*3/uL (ref 0.0–0.1)
EOS%: 4.3 % (ref 0.0–7.0)
HCT: 33.9 % — ABNORMAL LOW (ref 34.8–46.6)
HGB: 11.4 g/dL — ABNORMAL LOW (ref 11.6–15.9)
LYMPH%: 37.8 % (ref 14.0–49.7)
MCH: 32.8 pg (ref 25.1–34.0)
MCHC: 33.6 g/dL (ref 31.5–36.0)
MCV: 97.6 fL (ref 79.5–101.0)
MONO%: 9.8 % (ref 0.0–14.0)
NEUT%: 47.4 % (ref 38.4–76.8)
Platelets: 385 10*3/uL (ref 145–400)

## 2011-06-02 LAB — FERRITIN: Ferritin: 689 ng/mL — ABNORMAL HIGH (ref 10–291)

## 2011-06-04 ENCOUNTER — Ambulatory Visit (HOSPITAL_BASED_OUTPATIENT_CLINIC_OR_DEPARTMENT_OTHER): Payer: Medicare Other | Admitting: Hematology and Oncology

## 2011-06-04 ENCOUNTER — Encounter: Payer: Self-pay | Admitting: Hematology and Oncology

## 2011-06-04 ENCOUNTER — Telehealth: Payer: Self-pay | Admitting: Hematology and Oncology

## 2011-06-04 VITALS — BP 142/78 | HR 99 | Temp 97.0°F | Ht 64.0 in | Wt 116.0 lb

## 2011-06-04 DIAGNOSIS — D539 Nutritional anemia, unspecified: Secondary | ICD-10-CM

## 2011-06-04 DIAGNOSIS — D509 Iron deficiency anemia, unspecified: Secondary | ICD-10-CM | POA: Diagnosis not present

## 2011-06-04 NOTE — Telephone Encounter (Signed)
appt made and printed for 9/10 and   9/13 per pt as she will be out of town 9/6   aom

## 2011-06-04 NOTE — Progress Notes (Signed)
CC:   Lindsay A. Loanne Drilling, MD Lindsay Doyle, M.D. Lindsay Doyle. Deatra Ina, MD,FACG  IDENTIFYING STATEMENT:  The patient is a 78 year old woman who presents for followup.  INTERVAL HISTORY:  Lindsay Doyle was initially seen on 04/03/2011 with iron- deficiency anemia.  In summary, she had presented with rectal bleeding at an outside facility, where she was transfused 2 units of packed red blood cells.  She was worked up in [place] [employer] area and an endoscopy had shown a Schatzki ring with mild gastritis.  Colonoscopy was unremarkable.  A small bowel endoscopy showed no source of bleeding. When she presented, her ferritin level was 48.  Hemoglobin and hematocrit 11.9 and 34.8, platelets 256; there was no evidence of hemolysis; there was no evidence of hematuria; and there is no evidence of myelodyscrasia.  She was offered IV iron in the form of INFeD on 04/08/2011, which she tolerated well.  She notes markedly improved energy levels. She denies black tarry stools.  MEDICATIONS:  Medications reviewed and updated.  ALLERGIES:  None.  PAST MEDICAL HISTORY:  Unchanged.  FAMILY HISTORY:  Unchanged.  SOCIAL HISTORY:  Unchanged.  REVIEW OF SYSTEMS:  Ten-point review of systems negative.  PHYSICAL EXAMINATION:  General:  The patient is a well-appearing, well- nourished woman in no distress.  Vitals:  Pulse 99, blood pressure 142/78, temperature 97, respirations 20, weight 116 pounds.  HEENT: Head is atraumatic.  Mouth moist.  Chest:  Clear.  CVS:  Unremarkable. Abdomen:  Soft and nontender.  Bowel sounds present.  Extremities:  No edema.  LABORATORY DATA:  06/02/2011 white cell count 5.2, hemoglobin 11.4, hematocrit 33.9, platelets 285.  Iron 86, TIBC 310, saturation 28%, ferritin 689, folate greater than 20, vitamin B12 1829.  Sodium 141, potassium 4.4, chloride 105, CO2 of 24, BUN 22, creatinine 1.45, glucose 122, alkaline phosphatase 71, AST 17, ALT 22, T-bili 0.4.  IMPRESSION AND PLAN:   Lindsay Doyle is a 78 year old woman with a history of iron-deficiency anemia.  She also has a history of a Schatzki ring and gastritis.  She received IV iron in the form of INFeD on 04/08/2011. She has good increments of ferritin stores.  She feels well.  So with this said, Lindsay Doyle will be supplementing her diet with OTC iron every other week.  Her vitamin B12 levels are higher than normal.  Thus I have asked that she hold for 2 weeks and resume taking 1 tablet twice a week.  She was encouraged to follow up with her PCP.  She follows up in 6 months' time.    ______________________________ Lindsay Doyle, M.D. LIO/MEDQ  D:  06/04/2011  T:  06/04/2011  Job:  [id number]

## 2011-06-04 NOTE — Patient Instructions (Signed)
Patient to follow up as instructed.  

## 2011-06-04 NOTE — Progress Notes (Signed)
This office note has been dictated.

## 2011-07-29 ENCOUNTER — Ambulatory Visit (INDEPENDENT_AMBULATORY_CARE_PROVIDER_SITE_OTHER): Payer: Medicare Other | Admitting: Endocrinology

## 2011-07-29 ENCOUNTER — Telehealth: Payer: Self-pay | Admitting: *Deleted

## 2011-07-29 ENCOUNTER — Other Ambulatory Visit (INDEPENDENT_AMBULATORY_CARE_PROVIDER_SITE_OTHER): Payer: Medicare Other

## 2011-07-29 ENCOUNTER — Encounter: Payer: Self-pay | Admitting: Endocrinology

## 2011-07-29 VITALS — BP 108/72 | HR 93 | Temp 97.5°F | Ht 64.0 in | Wt 111.0 lb

## 2011-07-29 DIAGNOSIS — D509 Iron deficiency anemia, unspecified: Secondary | ICD-10-CM | POA: Diagnosis not present

## 2011-07-29 DIAGNOSIS — R21 Rash and other nonspecific skin eruption: Secondary | ICD-10-CM | POA: Diagnosis not present

## 2011-07-29 DIAGNOSIS — R5381 Other malaise: Secondary | ICD-10-CM

## 2011-07-29 DIAGNOSIS — R5383 Other fatigue: Secondary | ICD-10-CM | POA: Diagnosis not present

## 2011-07-29 LAB — CBC WITH DIFFERENTIAL/PLATELET
Basophils Absolute: 0.1 10*3/uL (ref 0.0–0.1)
Eosinophils Absolute: 0.1 10*3/uL (ref 0.0–0.7)
HCT: 34.2 % — ABNORMAL LOW (ref 36.0–46.0)
Hemoglobin: 11.6 g/dL — ABNORMAL LOW (ref 12.0–15.0)
Lymphs Abs: 2 10*3/uL (ref 0.7–4.0)
MCHC: 33.9 g/dL (ref 30.0–36.0)
Monocytes Absolute: 0.6 10*3/uL (ref 0.1–1.0)
Neutro Abs: 2.2 10*3/uL (ref 1.4–7.7)
RDW: 13.5 % (ref 11.5–14.6)

## 2011-07-29 MED ORDER — FLUTICASONE PROPIONATE 0.05 % EX CREA: TOPICAL_CREAM | Freq: Four times a day (QID) | CUTANEOUS | Status: DC | PRN

## 2011-07-29 NOTE — Telephone Encounter (Signed)
Called pt to inform of lab results, left message for pt to callback office (letter also mailed to pt). 

## 2011-07-29 NOTE — Patient Instructions (Addendum)
Please come in for a "medicare wellness" visit after 10/22/11. blood tests are being requested for you today.  You will receive a letter with results. You could try going off the metoprolol, to see if it helps your fatigue.   Please continue the anti-itch cream as needed.  i have sent a prescription to your pharmacy, to refill it.

## 2011-07-29 NOTE — Progress Notes (Signed)
Subjective:    Patient ID: Lindsay Doyle, female    DOB: 1933-08-31, 78 y.o.   MRN: [id number]  HPI Pt states few years of intermittent moderate itching of the right leg, but no assoc rash MVP:  Pt states no recent palpitations.   Anemia:  Pt states fatigue.  She takes 1 pill tiw.  Past Medical History  Diagnosis Date  . HYPERCHOLESTEROLEMIA 09/17/2009  . HYPERLIPIDEMIA 03/09/2007  . MITRAL VALVE PROLAPSE 03/09/2007  . ANEMIA-IRON DEFICIENCY 03/09/2007  . LEUKOPENIA, MILD 05/30/2008  . HYPERTENSION 11/12/2006  . COPD 03/09/2007  . OSTEOARTHRITIS 02/19/2009    Spinal  . OSTEOPOROSIS 11/12/2006  . COLONIC POLYPS, HX OF 03/09/2007  . HYPERGLYCEMIA 07/02/2007  . ASYMPTOMATIC POSTMENOPAUSAL STATUS 07/17/2008  . PSVT 05/03/2009  . Positive H. pylori test 10/1996  . WBC decreased     chronic  . Cataract   . Blood transfusion     3 transfusions    Past Surgical History  Procedure Date  . Abdominal hysterectomy   . Tonsillectomy   . Tubal ligation   . Cataract extraction   . Cardiac catheterization 1992    S/P in 1992 at [facility] in [place]. This was negative for any coronary artery disease  . Carotid duplex 08/29/2003  . Electrocardiogram 06/04/2006  . Ceasarean     History   Social History  . Marital Status: Married    Spouse Name: N/A    Number of Children: N/A  . Years of Education: N/A   Occupational History  .      retired   Social History Main Topics  . Smoking status: Never Smoker   . Smokeless tobacco: Not on file  . Alcohol Use: No  . Drug Use:   . Sexually Active:    Other Topics Concern  . Not on file   Social History Narrative  . No narrative on file    Current Outpatient Prescriptions on File Prior to Visit  Medication Sig Dispense Refill  . b complex vitamins tablet Take 1 tablet by mouth daily. Stop taking starting 06/04/11.  Resume in 2 weeks  -   Taking  2 times  Per week.      . Calcium Carbonate-Vitamin D (CALTRATE 600+D)  600-400 MG-UNIT per tablet Take 1 tablet by mouth daily.        . Iron 66 MG TABS Take 1 tablet by mouth every other day.      . metoprolol succinate (TOPROL-XL) 25 MG 24 hr tablet 1/2 tab daily      . Multiple Vitamin (MULTIVITAMIN) tablet Take 1 tablet by mouth daily.        . Omega-3 Fatty Acids ([employer] NATURAL FISH OIL) 1000 MG CAPS Take 1 capsule by mouth daily.        [name] omeprazole (PRILOSEC) 20 MG capsule Take 20 mg by mouth as needed.        Allergies  Allergen Reactions  . Azithromycin   . Cefuroxime Axetil   . Clarithromycin   . Hydrocod Polst-Cpm Polst Er   . Rofecoxib     REACTION: unspecified    Family History  Problem Relation Age of Onset  . Cancer Mother     stomach cancer  . Sarcoidosis Sister   . Cancer Brother     colon cancer  . Sarcoidosis Other     BP 108/72  Pulse 93  Temp(Src) 97.5 F (36.4 C) (Oral)  Ht 5\' 4"  (1.626 m)  Wt  111 lb (50.349 kg)  BMI 19.05 kg/m2  SpO2 99%   Review of Systems Denies brbpr and hematuria    Objective:   Physical Exam VITAL SIGNS:  See vs page GENERAL: no distress LUNGS:  Clear to auscultation HEART:  Regular rate and rhythm without murmurs noted. Normal S1,S2.   Right leg:  No rash, but minimal patchy hyperpigmentation.     Lab Results  Component Value Date   WBC 5.0 07/29/2011   HGB 11.6* 07/29/2011   HCT 34.2* 07/29/2011   PLT 338.0 07/29/2011   GLUCOSE 122* 06/02/2011   CHOL 286* 10/22/2010   TRIG 131.0 10/22/2010   HDL 89.50 10/22/2010   LDLDIRECT 165.2 10/22/2010   ALT 22 06/02/2011   AST 17 06/02/2011   NA 141 06/02/2011   K 4.4 06/02/2011   CL 105 06/02/2011   CREATININE 1.45* 06/02/2011   BUN 22 06/02/2011   CO2 24 06/02/2011   TSH 1.95 10/22/2010   INR 0.92 06/20/2010   HGBA1C 5.8 10/22/2010  fe sat is normal    Assessment & Plan:  Chronic mild itching, well-controlled Mild anemia.  We'll follow. Fatigue.  This could be caused by b-blocker, but she is on a low dosage.

## 2011-07-30 NOTE — Telephone Encounter (Signed)
Pt informed of lab results. 

## 2011-09-18 ENCOUNTER — Emergency Department (HOSPITAL_COMMUNITY)
Admission: EM | Admit: 2011-09-18 | Discharge: 2011-09-18 | Disposition: A | Discharge status: Home / Self Care | Payer: Medicare Other | Attending: Emergency Medicine | Admitting: Emergency Medicine

## 2011-09-18 ENCOUNTER — Encounter (HOSPITAL_COMMUNITY): Payer: Self-pay | Admitting: Family Medicine

## 2011-09-18 DIAGNOSIS — Z800 Family history of malignant neoplasm of digestive organs: Secondary | ICD-10-CM | POA: Insufficient documentation

## 2011-09-18 DIAGNOSIS — Z860100 Personal history of colon polyps, unspecified: Secondary | ICD-10-CM | POA: Insufficient documentation

## 2011-09-18 DIAGNOSIS — E7800 Pure hypercholesterolemia, unspecified: Secondary | ICD-10-CM | POA: Insufficient documentation

## 2011-09-18 DIAGNOSIS — I10 Essential (primary) hypertension: Secondary | ICD-10-CM | POA: Diagnosis not present

## 2011-09-18 DIAGNOSIS — I059 Rheumatic mitral valve disease, unspecified: Secondary | ICD-10-CM | POA: Diagnosis not present

## 2011-09-18 DIAGNOSIS — Z9849 Cataract extraction status, unspecified eye: Secondary | ICD-10-CM | POA: Diagnosis not present

## 2011-09-18 DIAGNOSIS — J4489 Other specified chronic obstructive pulmonary disease: Secondary | ICD-10-CM | POA: Insufficient documentation

## 2011-09-18 DIAGNOSIS — E785 Hyperlipidemia, unspecified: Secondary | ICD-10-CM | POA: Insufficient documentation

## 2011-09-18 DIAGNOSIS — M899 Disorder of bone, unspecified: Secondary | ICD-10-CM | POA: Diagnosis not present

## 2011-09-18 DIAGNOSIS — M810 Age-related osteoporosis without current pathological fracture: Secondary | ICD-10-CM | POA: Diagnosis not present

## 2011-09-18 DIAGNOSIS — Z90710 Acquired absence of both cervix and uterus: Secondary | ICD-10-CM | POA: Diagnosis not present

## 2011-09-18 DIAGNOSIS — E860 Dehydration: Secondary | ICD-10-CM | POA: Diagnosis not present

## 2011-09-18 DIAGNOSIS — M1990 Unspecified osteoarthritis, unspecified site: Secondary | ICD-10-CM | POA: Diagnosis not present

## 2011-09-18 DIAGNOSIS — N289 Disorder of kidney and ureter, unspecified: Secondary | ICD-10-CM | POA: Insufficient documentation

## 2011-09-18 DIAGNOSIS — R197 Diarrhea, unspecified: Secondary | ICD-10-CM | POA: Insufficient documentation

## 2011-09-18 DIAGNOSIS — J449 Chronic obstructive pulmonary disease, unspecified: Secondary | ICD-10-CM | POA: Insufficient documentation

## 2011-09-18 LAB — CBC
HCT: 40.5 % (ref 36.0–46.0)
MCV: 95.5 fL (ref 78.0–100.0)
RBC: 4.24 MIL/uL (ref 3.87–5.11)
WBC: 13.8 10*3/uL — ABNORMAL HIGH (ref 4.0–10.5)

## 2011-09-18 LAB — COMPREHENSIVE METABOLIC PANEL
CO2: 23 mEq/L (ref 19–32)
Calcium: 10.1 mg/dL (ref 8.4–10.5)
Creatinine, Ser: 2.02 mg/dL — ABNORMAL HIGH (ref 0.50–1.10)
GFR calc Af Amer: 26 mL/min — ABNORMAL LOW (ref >90)
GFR calc non Af Amer: 23 mL/min — ABNORMAL LOW (ref >90)
Glucose, Bld: 125 mg/dL — ABNORMAL HIGH (ref 70–99)

## 2011-09-18 LAB — DIFFERENTIAL
Eosinophils Relative: 0 % (ref 0–5)
Lymphocytes Relative: 5 % — ABNORMAL LOW (ref 12–46)
Lymphs Abs: 0.7 10*3/uL (ref 0.7–4.0)
Monocytes Absolute: 1.1 10*3/uL — ABNORMAL HIGH (ref 0.1–1.0)

## 2011-09-18 MED ORDER — SODIUM CHLORIDE 0.9 % IV SOLN
1000.0000 mL | Freq: Once | INTRAVENOUS | Status: AC
  Administered 2011-09-18: 1000 mL via INTRAVENOUS

## 2011-09-18 MED ORDER — METRONIDAZOLE 500 MG PO TABS: 500.0000 mg | ORAL_TABLET | Freq: Three times a day (TID) | ORAL | Status: AC

## 2011-09-18 MED ORDER — ONDANSETRON HCL 4 MG/2ML IJ SOLN
4.0000 mg | Freq: Once | INTRAMUSCULAR | Status: AC
  Administered 2011-09-18: 4 mg via INTRAVENOUS
  Filled 2011-09-18: qty 2

## 2011-09-18 MED ORDER — METRONIDAZOLE 500 MG PO TABS
ORAL_TABLET | ORAL | Status: AC
  Filled 2011-09-18: qty 1

## 2011-09-18 MED ORDER — SODIUM CHLORIDE 0.9 % IV SOLN
1000.0000 mL | INTRAVENOUS | Status: DC
  Administered 2011-09-18: 1000 mL via INTRAVENOUS

## 2011-09-18 MED ORDER — METRONIDAZOLE 500 MG PO TABS
500.0000 mg | ORAL_TABLET | Freq: Three times a day (TID) | ORAL | Status: DC
  Administered 2011-09-18: 500 mg via ORAL

## 2011-09-18 NOTE — ED Notes (Addendum)
Patient states she has had diarrhea for 2 weeks. States she called the nurse line and was told to stop taking Vitamin E and Omega 3 and if she wasn't feeling better to come to the ER. Last episode of diarrhea was 09/17/11 11pm. Denies nausea & vomiting. Denies recent antibiotic use.

## 2011-09-18 NOTE — ED Provider Notes (Signed)
History     CSN: [id number]  Arrival date & time 09/18/11  0305   First MD Initiated Contact with Patient 09/18/11 0411      Chief Complaint  Patient presents with  . Diarrhea    HPI Comments: The symptoms started two weeks ago.  Pt has had three loose stools daily. Last episode was 11pm.  Pt called the nurse line and was told to stop vitamins.  If she did not feel better she should go to the emergency room.  No fevers.  No blood in her stool.  NO recent antibiotics.  No foreign travel.    Patient is a 78 y.o. female presenting with diarrhea. The history is provided by the patient.  Diarrhea The primary symptoms include diarrhea. Primary symptoms do not include fever, nausea or vomiting.  The diarrhea is watery. The diarrhea occurs 2 to 4 times per day.  Associated medical issues do not include inflammatory bowel disease or irritable bowel syndrome.    Past Medical History  Diagnosis Date  . HYPERCHOLESTEROLEMIA 09/17/2009  . HYPERLIPIDEMIA 03/09/2007  . MITRAL VALVE PROLAPSE 03/09/2007  . ANEMIA-IRON DEFICIENCY 03/09/2007  . LEUKOPENIA, MILD 05/30/2008  . HYPERTENSION 11/12/2006  . COPD 03/09/2007  . OSTEOARTHRITIS 02/19/2009    Spinal  . OSTEOPOROSIS 11/12/2006  . COLONIC POLYPS, HX OF 03/09/2007  . HYPERGLYCEMIA 07/02/2007  . ASYMPTOMATIC POSTMENOPAUSAL STATUS 07/17/2008  . PSVT 05/03/2009  . Positive H. pylori test 10/1996  . WBC decreased     chronic  . Cataract   . Blood transfusion     3 transfusions    Past Surgical History  Procedure Date  . Abdominal hysterectomy   . Tonsillectomy   . Tubal ligation   . Cataract extraction   . Cardiac catheterization 1992    S/P in 1992 at [facility] in [place]. This was negative for any coronary artery disease  . Carotid duplex 08/29/2003  . Electrocardiogram 06/04/2006  . Ceasarean     Family History  Problem Relation Age of Onset  . Cancer Mother     stomach cancer  . Sarcoidosis Sister   . Cancer  Brother     colon cancer  . Sarcoidosis Other     History  Substance Use Topics  . Smoking status: Never Smoker   . Smokeless tobacco: Not on file  . Alcohol Use: No    OB History    Grav Para Term Preterm Abortions TAB SAB Ect Mult Living                  Review of Systems  Constitutional: Negative for fever.  Gastrointestinal: Positive for diarrhea. Negative for nausea and vomiting.  All other systems reviewed and are negative.    Allergies  Azithromycin; Cefuroxime axetil; Clarithromycin; Hydrocod polst-cpm polst er; and Rofecoxib  Home Medications   Current Outpatient Rx  Name Route Sig Dispense Refill  . B COMPLEX PO TABS Oral Take 1 tablet by mouth 2 (two) times a week.     [name] CALCIUM CARBONATE-VITAMIN D 600-400 MG-UNIT PO TABS Oral Take 1 tablet by mouth daily.      . IRON 66 MG PO TABS Oral Take 1 tablet by mouth every other day.    [name] METOPROLOL SUCCINATE ER 25 MG PO TB24 Oral Take 25 mg by mouth daily. 1/2 tab daily    . ONE-DAILY MULTI VITAMINS PO TABS Oral Take 1 tablet by mouth daily.      [name] [facility]  NATURAL FISH OIL 1000 MG PO CAPS Oral Take 1 capsule by mouth daily. Patient states that she stopped taking this medication 09/17/11 per her dr's office nursing line    . OMEPRAZOLE 20 MG PO CPDR Oral Take 20 mg by mouth daily as needed. For acid reflux    . VITAMIN E PO Oral Take 1 capsule by mouth daily. Patient states that she stopped taking this medication 09/17/11 per her dr's office nursing line      BP 107/67  Pulse 120  Temp 99.1 F (37.3 C) (Oral)  Resp 18  SpO2 96%  Physical Exam  Nursing note and vitals reviewed. Constitutional: She appears well-developed and well-nourished. No distress.  HENT:  Head: Normocephalic and atraumatic.  Right Ear: External ear normal.  Left Ear: External ear normal.  Eyes: Conjunctivae are normal. Right eye exhibits no discharge. Left eye exhibits no discharge. No scleral icterus.  Neck: Neck supple. No tracheal  deviation present.  Cardiovascular: Normal rate, regular rhythm and intact distal pulses.   Pulmonary/Chest: Effort normal and breath sounds normal. No stridor. No respiratory distress. She has no wheezes. She has no rales.  Abdominal: Soft. Bowel sounds are normal. She exhibits no distension. There is no tenderness. There is no rebound and no guarding.  Musculoskeletal: She exhibits no edema and no tenderness.  Neurological: She is alert. She has normal strength. No sensory deficit. Cranial nerve deficit:  no gross defecits noted. She exhibits normal muscle tone. She displays no seizure activity. Coordination normal.  Skin: Skin is warm and dry. No rash noted.  Psychiatric: She has a normal mood and affect.    ED Course  Procedures (including critical care time)  Labs Reviewed  CBC - Abnormal; Notable for the following:    WBC 13.8 (*)     All other components within normal limits  DIFFERENTIAL - Abnormal; Notable for the following:    Neutrophils Relative 87 (*)     Neutro Abs 12.0 (*)     Lymphocytes Relative 5 (*)     Monocytes Absolute 1.1 (*)     All other components within normal limits  COMPREHENSIVE METABOLIC PANEL - Abnormal; Notable for the following:    Sodium 132 (*)     Glucose, Bld 125 (*)     BUN 24 (*)     Creatinine, Ser 2.02 (*)     GFR calc non Af Amer 23 (*)     GFR calc Af Amer 26 (*)     All other components within normal limits   No results found.   1. Diarrhea   2. Dehydration   3. Renal insufficiency       MDM  Pt has no abdominal tenderness or pain but does have an increased wbc count and worsening renal insufficiency.  May partly be related to dehydration . She was given IV fluids in the ED. Doubt mesenteric ischemia, diverticulitis.  With her increased wbc and one week of symptoms, will dc home on oral flagyl.  Pt encouraged to follow up with PCP to recheck her creatinine.  Return to the ED for worsening symptoms, abdominal  pain.        Kathalene Frames, MD 09/18/11 [phone]

## 2011-09-18 NOTE — Discharge Instructions (Signed)
Dehydration, Adult Dehydration is when you lose more fluids from the body than you take in. Vital organs like the kidneys, brain, and heart cannot function without a proper amount of fluids and salt. Any loss of fluids from the body can cause dehydration.  CAUSES   Vomiting.   Diarrhea.   Excessive sweating.   Excessive urine output.   Fever.  SYMPTOMS  Mild dehydration  Thirst.   Dry lips.   Slightly dry mouth.  Moderate dehydration  Very dry mouth.   Sunken eyes.   Skin does not bounce back quickly when lightly pinched and released.   Dark urine and decreased urine production.   Decreased tear production.   Headache.  Severe dehydration  Very dry mouth.   Extreme thirst.   Rapid, weak pulse (more than 100 beats per minute at rest).   Cold hands and feet.   Not able to sweat in spite of heat and temperature.   Rapid breathing.   Blue lips.   Confusion and lethargy.   Difficulty being awakened.   Minimal urine production.   No tears.  DIAGNOSIS  Your caregiver will diagnose dehydration based on your symptoms and your exam. Blood and urine tests will help confirm the diagnosis. The diagnostic evaluation should also identify the cause of dehydration. TREATMENT  Treatment of mild or moderate dehydration can often be done at home by increasing the amount of fluids that you drink. It is best to drink small amounts of fluid more often. Drinking too much at one time can make vomiting worse. Refer to the home care instructions below. Severe dehydration needs to be treated at the hospital where you will probably be given intravenous (IV) fluids that contain water and electrolytes. HOME CARE INSTRUCTIONS   Ask your caregiver about specific rehydration instructions.   Drink enough fluids to keep your urine clear or pale yellow.   Drink small amounts frequently if you have nausea and vomiting.   Eat as you normally do.   Avoid:   Foods or drinks high in  sugar.   Carbonated drinks.   Juice.   Extremely hot or cold fluids.   Drinks with caffeine.   Fatty, greasy foods.   Alcohol.   Tobacco.   Overeating.   Gelatin desserts.   Wash your hands well to avoid spreading bacteria and viruses.   Only take over-the-counter or prescription medicines for pain, discomfort, or fever as directed by your caregiver.   Ask your caregiver if you should continue all prescribed and over-the-counter medicines.   Keep all follow-up appointments with your caregiver.  SEEK MEDICAL CARE IF:  You have abdominal pain and it increases or stays in one area (localizes).   You have a rash, stiff neck, or severe headache.   You are irritable, sleepy, or difficult to awaken.   You are weak, dizzy, or extremely thirsty.  SEEK IMMEDIATE MEDICAL CARE IF:   You are unable to keep fluids down or you get worse despite treatment.   You have frequent episodes of vomiting or diarrhea.   You have blood or green matter (bile) in your vomit.   You have blood in your stool or your stool looks black and tarry.   You have not urinated in 6 to 8 hours, or you have only urinated a small amount of very dark urine.   You have a fever.   You faint.  MAKE SURE YOU:   Understand these instructions.   Will watch your condition.  Will get help right away if you are not doing well or get worse.  Document Released: 03/17/2005 Document Revised: 03/06/2011 Document Reviewed: 11/04/2010 [facility] Patient Information 2012 [place].Diarrhea Diarrhea is watery poop (stool). The most common cause of diarrhea is a germ. Other causes include:  Food poisoning.   A reaction to medicine.  HOME CARE   Drink clear fluids. This can stop you from losing too much body fluid (dehydration).   Drink enough fluids to keep your pee (urine) clear or pale yellow.   Avoid solid foods and dairy products until you start to feel better. Then start eating bland foods, such  as:   Bananas.   Rice.   Crackers.   Applesauce.   Dry toast.   Avoid spicy foods, caffeine, and alcohol.   Your doctor may give medicine to help with cramps and watery poop. Take this as told. Avoid these medicines if you have a fever or blood in your poop.   Take your medicine as told. Finish them even if you start to feel better.  GET HELP RIGHT AWAY IF:   The watery poop lasts longer than 3 days.   You have a fever.   Your baby is older than 3 months with a rectal temperature of 100.5 F (38.1 C) or higher for more than 1 day.   There is blood in your poop.   You start to throw up (vomit).   You lose too much fluid.  MAKE SURE YOU:   Understand these instructions.   Will watch your condition.   Will get help right away if you are not doing well or get worse.  Document Released: 09/03/2007 Document Revised: 03/06/2011 Document Reviewed: 09/03/2007 [facility] Patient Information 2012 [place].

## 2011-09-22 ENCOUNTER — Telehealth: Payer: Self-pay | Admitting: Endocrinology

## 2011-09-22 NOTE — Telephone Encounter (Signed)
Caller: Jannnie/Patient; PCP: Renato Shin; CB#: [phone]; ; ; Call regarding F/U With Office;  Pt was seen in the ER on 09/18/11 and treated for dehydration. She was also told she has a kidney disorder and she needs to f/u with PCP for more lab work. She states she was having diarrhea. She denies Diarrhea now, stools are soft. She is on antibiotic they precribed and will take her last dose on Wednesday. Call transferred to [facility] in office to schedule ER f/u appt.

## 2011-09-30 ENCOUNTER — Encounter: Payer: Self-pay | Admitting: Endocrinology

## 2011-09-30 ENCOUNTER — Other Ambulatory Visit (INDEPENDENT_AMBULATORY_CARE_PROVIDER_SITE_OTHER): Payer: Medicare Other

## 2011-09-30 ENCOUNTER — Ambulatory Visit (INDEPENDENT_AMBULATORY_CARE_PROVIDER_SITE_OTHER): Payer: Medicare Other | Admitting: Endocrinology

## 2011-09-30 VITALS — BP 130/80 | HR 86 | Temp 97.7°F | Ht 64.0 in | Wt 108.0 lb

## 2011-09-30 DIAGNOSIS — K769 Liver disease, unspecified: Secondary | ICD-10-CM

## 2011-09-30 DIAGNOSIS — D509 Iron deficiency anemia, unspecified: Secondary | ICD-10-CM

## 2011-09-30 DIAGNOSIS — R7309 Other abnormal glucose: Secondary | ICD-10-CM | POA: Diagnosis not present

## 2011-09-30 DIAGNOSIS — N289 Disorder of kidney and ureter, unspecified: Secondary | ICD-10-CM

## 2011-09-30 DIAGNOSIS — N184 Chronic kidney disease, stage 4 (severe): Secondary | ICD-10-CM | POA: Insufficient documentation

## 2011-09-30 LAB — BASIC METABOLIC PANEL
CO2: 28 mEq/L (ref 19–32)
Chloride: 107 mEq/L (ref 96–112)
Creatinine, Ser: 1.4 mg/dL — ABNORMAL HIGH (ref 0.4–1.2)
Potassium: 4.3 mEq/L (ref 3.5–5.1)

## 2011-09-30 LAB — CBC WITH DIFFERENTIAL/PLATELET
Basophils Absolute: 0 10*3/uL (ref 0.0–0.1)
Basophils Relative: 0.7 % (ref 0.0–3.0)
Hemoglobin: 12.6 g/dL (ref 12.0–15.0)
Lymphocytes Relative: 33.7 % (ref 12.0–46.0)
Monocytes Relative: 9.2 % (ref 3.0–12.0)
Neutro Abs: 2.6 10*3/uL (ref 1.4–7.7)
RBC: 3.78 Mil/uL — ABNORMAL LOW (ref 3.87–5.11)
RDW: 13.7 % (ref 11.5–14.6)

## 2011-09-30 LAB — HEMOGLOBIN A1C: Hgb A1c MFr Bld: 5.9 % (ref 4.6–6.5)

## 2011-09-30 LAB — HEPATIC FUNCTION PANEL
AST: 21 U/L (ref 0–37)
Total Bilirubin: 0.7 mg/dL (ref 0.3–1.2)

## 2011-09-30 LAB — IBC PANEL
Saturation Ratios: 33.2 % (ref 20.0–50.0)
Transferrin: 193.4 mg/dL — ABNORMAL LOW (ref 212.0–360.0)

## 2011-09-30 NOTE — Patient Instructions (Addendum)
blood tests are being requested for you today.  You will receive a letter with results.

## 2011-09-30 NOTE — Progress Notes (Signed)
Subjective:    Patient ID: Lindsay Doyle, female    DOB: April 19, 1933, 78 y.o.   MRN: [id number]  HPI The state of at least three ongoing medical problems is addressed today: Pt was seen in er approx 10 days ago for diarrhea and dehydration.  She was rx'ed with ivf and zofran. She now says she is 75% better. She was noted in er to have elev transaminases. Denies abd pain.   She was noted to have increased creat.  Denies dysuria. Anemia was again noted.  She takes fe 1 tab tiw.  Past Medical History  Diagnosis Date  . HYPERCHOLESTEROLEMIA 09/17/2009  . HYPERLIPIDEMIA 03/09/2007  . MITRAL VALVE PROLAPSE 03/09/2007  . ANEMIA-IRON DEFICIENCY 03/09/2007  . LEUKOPENIA, MILD 05/30/2008  . HYPERTENSION 11/12/2006  . COPD 03/09/2007  . OSTEOARTHRITIS 02/19/2009    Spinal  . OSTEOPOROSIS 11/12/2006  . COLONIC POLYPS, HX OF 03/09/2007  . HYPERGLYCEMIA 07/02/2007  . ASYMPTOMATIC POSTMENOPAUSAL STATUS 07/17/2008  . PSVT 05/03/2009  . Positive H. pylori test 10/1996  . WBC decreased     chronic  . Cataract   . Blood transfusion     3 transfusions    Past Surgical History  Procedure Date  . Abdominal hysterectomy   . Tonsillectomy   . Tubal ligation   . Cataract extraction   . Cardiac catheterization 1992    S/P in 1992 at [facility] in [place]. This was negative for any coronary artery disease  . Carotid duplex 08/29/2003  . Electrocardiogram 06/04/2006  . Ceasarean     History   Social History  . Marital Status: Married    Spouse Name: N/A    Number of Children: N/A  . Years of Education: N/A   Occupational History  .      retired   Social History Main Topics  . Smoking status: Never Smoker   . Smokeless tobacco: Not on file  . Alcohol Use: No  . Drug Use:   . Sexually Active:    Other Topics Concern  . Not on file   Social History Narrative  . No narrative on file    Current Outpatient Prescriptions on File Prior to Visit  Medication Sig Dispense  Refill  . Iron 66 MG TABS Take 1 tablet by mouth every other day.      . metoprolol succinate (TOPROL-XL) 25 MG 24 hr tablet Take 25 mg by mouth daily. 1/2 tab daily      . omeprazole (PRILOSEC) 20 MG capsule Take 20 mg by mouth daily as needed. For acid reflux      . b complex vitamins tablet Take 1 tablet by mouth 2 (two) times a week.       . Calcium Carbonate-Vitamin D (CALTRATE 600+D) 600-400 MG-UNIT per tablet Take 1 tablet by mouth daily.        . Multiple Vitamin (MULTIVITAMIN) tablet Take 1 tablet by mouth daily.        . Omega-3 Fatty Acids ([employer] NATURAL FISH OIL) 1000 MG CAPS Take 1 capsule by mouth daily. Patient states that she stopped taking this medication 09/17/11 per her dr's office nursing line      . VITAMIN E PO Take 1 capsule by mouth daily. Patient states that she stopped taking this medication 09/17/11 per her dr's office nursing line        Allergies  Allergen Reactions  . Azithromycin   . Cefuroxime Axetil   . Clarithromycin   . Hydrocod  Polst-Cpm Polst Er   . Rofecoxib     REACTION: unspecified    Family History  Problem Relation Age of Onset  . Cancer Mother     stomach cancer  . Sarcoidosis Sister   . Cancer Brother     colon cancer  . Sarcoidosis Other     BP 130/80  Pulse 86  Temp 97.7 F (36.5 C) (Oral)  Ht 5\' 4"  (1.626 m)  Wt 108 lb (48.988 kg)  BMI 18.54 kg/m2  SpO2 99%    Review of Systems Denies fever and brbpr    Objective:   Physical Exam VITAL SIGNS:  See vs page GENERAL: no distress Ext: no edema  Lab Results  Component Value Date   WBC 5.0 09/30/2011   HGB 12.6 09/30/2011   HCT 37.5 09/30/2011   PLT 285.0 09/30/2011   GLUCOSE 98 09/30/2011   CHOL 286* 10/22/2010   TRIG 131.0 10/22/2010   HDL 89.50 10/22/2010   LDLDIRECT 165.2 10/22/2010   ALT 15 09/30/2011   AST 21 09/30/2011   NA 143 09/30/2011   K 4.3 09/30/2011   CL 107 09/30/2011   CREATININE 1.4* 09/30/2011   BUN 19 09/30/2011   CO2 28 09/30/2011   TSH 1.95 10/22/2010   INR 0.92  06/20/2010   HGBA1C 5.9 09/30/2011      Assessment & Plan:  Renal insuff, improved Anemia, resolved on fe tabs Hyperglycemia, stable H/o elev transaminases, resolved. viral ge, better

## 2011-10-01 ENCOUNTER — Telehealth: Payer: Self-pay | Admitting: *Deleted

## 2011-10-01 NOTE — Telephone Encounter (Signed)
Pt informed of MD's advisement. 

## 2011-10-01 NOTE — Telephone Encounter (Signed)
Drink plenty of fluids, especially in the summer.   Continue to avoid smoking.  Keep your blood pressure, sugar, and cholesterol in a good range

## 2011-10-01 NOTE — Telephone Encounter (Signed)
Called pt to inform of lab results, pt informed (letter also mailed to pt). Pt wants to know what she can do in the future to help her kidneys, she had heard that drinking lemon juice may help.

## 2011-10-06 ENCOUNTER — Telehealth: Payer: Self-pay | Admitting: Endocrinology

## 2011-10-06 NOTE — Telephone Encounter (Signed)
Please advise 

## 2011-10-06 NOTE — Telephone Encounter (Signed)
Caller: Lindsay Doyle/Patient; PCP: Renato Shin; CB#: [phone]; Call regarding Recurrent Diarrhea; Onset 10/06/11.  Afebrile. Completed Metronidazole.  Advised to see MD within 24 hrs for evaluated by provider and symptoms are worsening with home care per Diarrhea or Other Changes in [place]. Appt scheduled for 10/07/11 at 0815 with Dr. Scarlette Calico.   Transferred to office/Caroline for info about Dr Ronnald Ramp and to be placed on wait list for appt cancellation 10/06/11.

## 2011-10-07 ENCOUNTER — Encounter: Payer: Self-pay | Admitting: Endocrinology

## 2011-10-07 ENCOUNTER — Ambulatory Visit (INDEPENDENT_AMBULATORY_CARE_PROVIDER_SITE_OTHER): Payer: Medicare Other | Admitting: Endocrinology

## 2011-10-07 ENCOUNTER — Encounter: Payer: Self-pay | Admitting: Gastroenterology

## 2011-10-07 ENCOUNTER — Telehealth: Payer: Self-pay | Admitting: Gastroenterology

## 2011-10-07 ENCOUNTER — Ambulatory Visit: Payer: Medicare Other | Admitting: Internal Medicine

## 2011-10-07 VITALS — BP 116/68 | HR 86 | Temp 98.2°F | Ht 64.0 in | Wt 109.8 lb

## 2011-10-07 DIAGNOSIS — R197 Diarrhea, unspecified: Secondary | ICD-10-CM | POA: Insufficient documentation

## 2011-10-07 NOTE — Progress Notes (Signed)
Subjective:    Patient ID: Lindsay Doyle, female    DOB: [birth year], 78 y.o.   MRN: [id number]  HPI Pt states diarrhea resolved, but ha recurred.  Overall, she has a few week of intermittent slight cramps throughout the abdomen, and assoc diarrhea.  She says she won't take flagyl any more, an she says it is hard to swallow.   Past Medical History  Diagnosis Date  . HYPERCHOLESTEROLEMIA 09/17/2009  . HYPERLIPIDEMIA 03/09/2007  . MITRAL VALVE PROLAPSE 03/09/2007  . ANEMIA-IRON DEFICIENCY 03/09/2007  . LEUKOPENIA, MILD 05/30/2008  . HYPERTENSION 11/12/2006  . COPD 03/09/2007  . OSTEOARTHRITIS 02/19/2009    Spinal  . OSTEOPOROSIS 11/12/2006  . COLONIC POLYPS, HX OF 03/09/2007  . HYPERGLYCEMIA 07/02/2007  . ASYMPTOMATIC POSTMENOPAUSAL STATUS 07/17/2008  . PSVT 05/03/2009  . Positive H. pylori test 10/1996  . WBC decreased     chronic  . Cataract   . Blood transfusion     3 transfusions    Past Surgical History  Procedure Date  . Abdominal hysterectomy   . Tonsillectomy   . Tubal ligation   . Cataract extraction   . Cardiac catheterization 1992    S/P in 1992 at [facility] in [place]. This was negative for any coronary artery disease  . Carotid duplex 08/29/2003  . Electrocardiogram 06/04/2006  . Ceasarean     History   Social History  . Marital Status: Married    Spouse Name: N/A    Number of Children: N/A  . Years of Education: N/A   Occupational History  .      retired   Social History Main Topics  . Smoking status: Never Smoker   . Smokeless tobacco: Not on file  . Alcohol Use: No  . Drug Use: No  . Sexually Active: Not on file   Other Topics Concern  . Not on file   Social History Narrative  . No narrative on file    Current Outpatient Prescriptions on File Prior to Visit  Medication Sig Dispense Refill  . b complex vitamins tablet Take 1 tablet by mouth 2 (two) times a week.       . Calcium Carbonate-Vitamin D (CALTRATE 600+D) 600-400  MG-UNIT per tablet Take 1 tablet by mouth daily.        . Iron 66 MG TABS Take 1 tablet by mouth every other day.      . metoprolol succinate (TOPROL-XL) 25 MG 24 hr tablet Take 25 mg by mouth daily. 1/2 tab daily      . Multiple Vitamin (MULTIVITAMIN) tablet Take 1 tablet by mouth daily.        . Omega-3 Fatty Acids ([employer] NATURAL FISH OIL) 1000 MG CAPS Take 1 capsule by mouth daily. Patient states that she stopped taking this medication 09/17/11 per her dr's office nursing line      . omeprazole (PRILOSEC) 20 MG capsule Take 20 mg by mouth daily as needed. For acid reflux      . VITAMIN E PO Take 1 capsule by mouth daily. Patient states that she stopped taking this medication 09/17/11 per her dr's office nursing line        Allergies  Allergen Reactions  . Azithromycin   . Cefuroxime Axetil   . Clarithromycin   . Hydrocod Polst-Cpm Polst Er   . Metronidazole   . Rofecoxib     REACTION: unspecified    Family History  Problem Relation Age of Onset  . Cancer  Mother     stomach cancer  . Sarcoidosis Sister   . Cancer Brother     colon cancer  . Sarcoidosis Other     BP 116/68  Pulse 86  Temp 98.2 F (36.8 C) (Oral)  Ht 5\' 4"  (1.626 m)  Wt 109 lb 12 oz (49.782 kg)  BMI 18.84 kg/m2  SpO2 97%  Review of Systems Denies fever and brbpr    Objective:   Physical Exam VITAL SIGNS:  See vs page GENERAL: no distress. ABDOMEN: abdomen is soft, nontender.  no hepatosplenomegaly. not distended.  no hernia.       Assessment & Plan:  Diarrhea, persistent, uncertain etiology

## 2011-10-07 NOTE — Telephone Encounter (Signed)
Pt having diarrhea and requests to be seen sooner than scheduled appt because she is going out of town. Pt scheduled to see Nicoletta Ba PA tomorrow at 10am. Pt aware of appt date and time.

## 2011-10-07 NOTE — Patient Instructions (Addendum)
Refer to a gastroenterology specialist.  you will receive a phone call, about a day and time for an appointment.

## 2011-10-08 ENCOUNTER — Ambulatory Visit (INDEPENDENT_AMBULATORY_CARE_PROVIDER_SITE_OTHER): Payer: Medicare Other | Admitting: Physician Assistant

## 2011-10-08 ENCOUNTER — Encounter: Payer: Self-pay | Admitting: Physician Assistant

## 2011-10-08 ENCOUNTER — Other Ambulatory Visit (INDEPENDENT_AMBULATORY_CARE_PROVIDER_SITE_OTHER): Payer: Medicare Other

## 2011-10-08 VITALS — BP 120/60 | HR 84 | Ht 64.0 in | Wt 109.0 lb

## 2011-10-08 DIAGNOSIS — A09 Infectious gastroenteritis and colitis, unspecified: Secondary | ICD-10-CM | POA: Diagnosis not present

## 2011-10-08 LAB — BASIC METABOLIC PANEL
BUN: 15 mg/dL (ref 6–23)
CO2: 30 mEq/L (ref 19–32)
Chloride: 101 mEq/L (ref 96–112)
Glucose, Bld: 104 mg/dL — ABNORMAL HIGH (ref 70–99)
Potassium: 4.3 mEq/L (ref 3.5–5.1)

## 2011-10-08 MED ORDER — SACCHAROMYCES BOULARDII 250 MG PO CAPS: 250.0000 mg | ORAL_CAPSULE | Freq: Two times a day (BID) | ORAL | Status: AC

## 2011-10-08 MED ORDER — METRONIDAZOLE 250 MG PO TABS: ORAL_TABLET | ORAL | Status: DC

## 2011-10-08 NOTE — Patient Instructions (Addendum)
Please go to the basement level to have your labs drawn.  Please call us if symptoms worsen or are not cleared up after you finish antibiotics. We sent prescriptions to the pharmacy, [place]

## 2011-10-08 NOTE — Progress Notes (Signed)
Subjective:    Patient ID: Lindsay Doyle, female    DOB: 05-15-1933, 78 y.o.   MRN: [id number]  HPI Yurianna is a pleasant 78 year old white female known to Dr. Deatra Ina from work up done in 2012 for melena and anemia. At that time she underwent colonoscopy in March of 2012 which was normal. EGD showed mild gastritis and capsule endoscopy was then done and showed some mild patchy erythema of the small bowel which was nonspecific.  This time patient comes in as an urgent add-on for evaluation of diarrhea. She says she had onset of diarrhea mid June and tried to treat herself for the first 5 days but she said she was having multiple episodes of watery stool per day and eventually went to the emergency room and she did not get any better. She was given a prescription for Flagyl for 1 week and she says her symptoms significantly improved and went away for about a week and then recurred over the past 5 days. She says her symptoms are not as bad as an initial onset that she is having loose to liquid stools again with 3-4 bowel movements per day generally. She says she's not having any nocturnal stooling she has no complaints of pain or cramping no fever or chills she is eating very bland , has no complaints of nausea. She has not had any melena or hematochezia through this illness. She does not remember taking anti-any antibiotics over the past few months. She says she was fine until the acute onset on June 15. CBC done on 09/30/2011 showed hemoglobin of 12.6 hematocrit 37.5 MCV of 98 iron studies were within normal limits Review of Systems  Constitutional: Positive for activity change and appetite change.  HENT: Negative.   Eyes: Negative.   Respiratory: Negative.   Cardiovascular: Negative.   Gastrointestinal: Positive for diarrhea.  Genitourinary: Negative.   Musculoskeletal: Negative.   Skin: Negative.   Neurological: Negative.   Hematological: Negative.   Psychiatric/Behavioral: Negative.     Outpatient Prescriptions Prior to Visit  Medication Sig Dispense Refill  . b complex vitamins tablet Take 1 tablet by mouth 2 (two) times a week.       . Calcium Carbonate-Vitamin D (CALTRATE 600+D) 600-400 MG-UNIT per tablet Take 1 tablet by mouth daily.        . Iron 66 MG TABS Take 1 tablet by mouth every other day.      . metoprolol succinate (TOPROL-XL) 25 MG 24 hr tablet Take 25 mg by mouth daily. 1/2 tab daily      . Multiple Vitamin (MULTIVITAMIN) tablet Take 1 tablet by mouth daily.        . Omega-3 Fatty Acids ([employer] NATURAL FISH OIL) 1000 MG CAPS Take 1 capsule by mouth daily. Patient states that she stopped taking this medication 09/17/11 per her dr's office nursing line      . omeprazole (PRILOSEC) 20 MG capsule Take 20 mg by mouth daily as needed. For acid reflux      . VITAMIN E PO Take 1 capsule by mouth daily. Patient states that she stopped taking this medication 09/17/11 per her dr's office nursing line       Allergies  Allergen Reactions  . Azithromycin   . Cefuroxime Axetil   . Clarithromycin   . Hydrocod Polst-Cpm Polst Er   . Metronidazole   . Rofecoxib     REACTION: unspecified   Patient Active Problem List  Diagnosis  . HYPERCHOLESTEROLEMIA  . HYPERLIPIDEMIA  .  ANEMIA-IRON DEFICIENCY  . LEUKOPENIA, MILD  . HYPERTENSION  . MITRAL VALVE PROLAPSE  . PSVT  . SINUSITIS- ACUTE-NOS  . ALLERGIC RHINITIS  . ASTHMA  . COPD  . OSTEOARTHRITIS  . OSTEOPOROSIS  . SKIN RASH  . COUGH  . HYPERGLYCEMIA  . CONTUSION, LEFT FOOT  . COLONIC POLYPS, HX OF  . ASYMPTOMATIC POSTMENOPAUSAL STATUS  . Hip pain, left  . Unspecified disorder of liver  . Menopausal state  . Schatzki's ring  . Renal insufficiency  . Diarrhea   History   Social History  . Marital Status: Married    Spouse Name: N/A    Number of Children: N/A  . Years of Education: N/A   Occupational History  .      retired   Social History Main Topics  . Smoking status: Never Smoker   . Smokeless  tobacco: Not on file  . Alcohol Use: No  . Drug Use: No  . Sexually Active: Not on file   Other Topics Concern  . Not on file   Social History Narrative  . No narrative on file       Objective:   Physical Exam well-developed thin elderly white female in no acute distress, pleasant blood pressure 120/60 pulse 84 height 5 foot 4 weight 109. HEENT; nontraumatic normocephalic EOMI PERRLA sclera anicteric, oropharynx moist, Neck ;supple no JVD, Cardiovascular; regular rate and rhythm with S1-S2 no murmur or gallop, Pulmonary ;clear bilaterally, Abdomen; soft basically nontender there is no palpable mass or hepatosplenomegaly bowel sounds are active, Extremities; thin, no clubbing cyanosis or edema skin warm dry, Psych; mood and affect normal and appropriate.       Assessment & Plan:  #5 78 year old white female with acute diarrheal illness initial onset about 3 weeks ago and clinically resolved after a one-week course of Flagyl and now recurred over the past 5 days Will rule out infectious etiologies i.e. C. difficile. Capsule endoscopy done a year ago showed some minimal inflammatory changes felt to be nonspecific in the small bowel if her symptoms persist may need to consider further diagnostic evaluation to rule out IBD #2 history of anemia and iron deficiency- most recent labs normal  Plan; check BMET as labs done through the emergency room show mild elevation of her creatinine Check stool for C&S C. difficile PCR and stool for lactoferrin Start Flagyl 250 mg by mouth 4 times daily x10 days, patient to take with food Add florastor one by mouth twice a day x2 weeks Soft bland diet  Further plans pending her response to above. We will call her with her lab results and if symptoms persist she will need follow up with Dr. Deatra Ina

## 2011-10-09 ENCOUNTER — Other Ambulatory Visit: Payer: Medicare Other

## 2011-10-09 DIAGNOSIS — A09 Infectious gastroenteritis and colitis, unspecified: Secondary | ICD-10-CM | POA: Diagnosis not present

## 2011-10-09 NOTE — Progress Notes (Signed)
Agree with Ms. Esterwood's assessment and plan. Carl E. Gessner, MD, FACG   

## 2011-10-10 LAB — CLOSTRIDIUM DIFFICILE BY PCR: Toxigenic C. Difficile by PCR: NOT DETECTED

## 2011-10-10 LAB — FECAL LACTOFERRIN, QUANT: Lactoferrin: NEGATIVE

## 2011-10-15 ENCOUNTER — Ambulatory Visit: Payer: Medicare Other | Admitting: Gastroenterology

## 2011-10-29 DIAGNOSIS — L259 Unspecified contact dermatitis, unspecified cause: Secondary | ICD-10-CM | POA: Diagnosis not present

## 2011-10-29 DIAGNOSIS — D485 Neoplasm of uncertain behavior of skin: Secondary | ICD-10-CM | POA: Diagnosis not present

## 2011-11-13 ENCOUNTER — Telehealth: Payer: Self-pay | Admitting: *Deleted

## 2011-11-13 ENCOUNTER — Telehealth: Payer: Self-pay | Admitting: Hematology and Oncology

## 2011-11-13 NOTE — Telephone Encounter (Signed)
Spoke with pt today.  Was informed by pt that she wished to cancel her lab and f/u appts in  September.   Per pt , she is being treated for diarrhea problems now.   Asked if pt would like to reschedule;  Pt stated "  NO ,  Not at this time ". Pt stated she would like to be clear from diarrhea issue first.   Message to md.

## 2011-11-13 NOTE — Telephone Encounter (Signed)
Pt called today to cx appts and does not wish to r/s right now. Call switched to Thu.

## 2011-11-21 ENCOUNTER — Other Ambulatory Visit: Payer: Self-pay | Admitting: Endocrinology

## 2011-12-02 ENCOUNTER — Telehealth: Payer: Self-pay | Admitting: Gastroenterology

## 2011-12-02 NOTE — Telephone Encounter (Signed)
Pt states she sometimes has 2 bowel movements a day. Reports that it is not diarrhea like it was when she saw [place]. Pt wanted to know if perhaps the iron pills could be causing the more frequent stools. Encouraged pt to call Dr. Hollice Espy office and speak with them. Pt wondered if she could stop the iron and she was told to discuss that will Dr. Jamse Arn since she placed her on the iron. Pt verbalized understanding.

## 2011-12-05 ENCOUNTER — Ambulatory Visit: Payer: Medicare Other | Admitting: Hematology and Oncology

## 2011-12-09 ENCOUNTER — Other Ambulatory Visit: Payer: Medicare Other | Admitting: Lab

## 2011-12-12 ENCOUNTER — Ambulatory Visit: Payer: Medicare Other | Admitting: Hematology and Oncology

## 2011-12-29 ENCOUNTER — Telehealth: Payer: Self-pay | Admitting: Endocrinology

## 2011-12-29 NOTE — Telephone Encounter (Signed)
This patient currently see's Dr Loanne Drilling for PCP but she does not want to travel to his new location and she wants to see Dr. Asa Lente, informed patient that she does not usually accept transfers from within the practice but patient was insistent that I ask her, would you be willing to see this patient as her PCP?

## 2011-12-29 NOTE — Telephone Encounter (Signed)
Discussed with Dr Asa Lente and she declines at this time.

## 2011-12-29 NOTE — Telephone Encounter (Signed)
Fine with me

## 2011-12-30 ENCOUNTER — Ambulatory Visit: Payer: Medicare Other | Admitting: Endocrinology

## 2011-12-31 NOTE — Telephone Encounter (Signed)
Left message for patient to call back  

## 2011-12-31 NOTE — Telephone Encounter (Signed)
Patient would like to know if Dr Ronnald Ramp would be willing to accept her as a new patient?

## 2011-12-31 NOTE — Telephone Encounter (Signed)
yes

## 2012-01-05 ENCOUNTER — Telehealth: Payer: Self-pay | Admitting: Endocrinology

## 2012-01-05 MED ORDER — METOPROLOL SUCCINATE ER 25 MG PO TB24: 12.5000 mg | ORAL_TABLET | Freq: Every day | ORAL | Status: DC

## 2012-01-05 NOTE — Telephone Encounter (Signed)
Pt.notified

## 2012-01-05 NOTE — Telephone Encounter (Signed)
Ok with me 

## 2012-01-05 NOTE — Telephone Encounter (Signed)
The pt called hoping to get a refill of her generic Toprol.

## 2012-01-05 NOTE — Telephone Encounter (Signed)
Pt wants to switch doctors to stay at the [facility].  Will this be OK?  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

## 2012-01-08 ENCOUNTER — Encounter: Payer: Self-pay | Admitting: Internal Medicine

## 2012-01-08 ENCOUNTER — Other Ambulatory Visit (INDEPENDENT_AMBULATORY_CARE_PROVIDER_SITE_OTHER): Payer: Medicare Other

## 2012-01-08 ENCOUNTER — Ambulatory Visit (INDEPENDENT_AMBULATORY_CARE_PROVIDER_SITE_OTHER): Payer: Medicare Other | Admitting: Internal Medicine

## 2012-01-08 VITALS — BP 122/80 | HR 82 | Temp 97.0°F | Ht 64.0 in | Wt 110.4 lb

## 2012-01-08 DIAGNOSIS — R7309 Other abnormal glucose: Secondary | ICD-10-CM

## 2012-01-08 DIAGNOSIS — M810 Age-related osteoporosis without current pathological fracture: Secondary | ICD-10-CM

## 2012-01-08 DIAGNOSIS — Z0000 Encounter for general adult medical examination without abnormal findings: Secondary | ICD-10-CM | POA: Insufficient documentation

## 2012-01-08 DIAGNOSIS — D509 Iron deficiency anemia, unspecified: Secondary | ICD-10-CM

## 2012-01-08 DIAGNOSIS — Z23 Encounter for immunization: Secondary | ICD-10-CM | POA: Diagnosis not present

## 2012-01-08 DIAGNOSIS — E785 Hyperlipidemia, unspecified: Secondary | ICD-10-CM

## 2012-01-08 DIAGNOSIS — I10 Essential (primary) hypertension: Secondary | ICD-10-CM | POA: Diagnosis not present

## 2012-01-08 DIAGNOSIS — N289 Disorder of kidney and ureter, unspecified: Secondary | ICD-10-CM | POA: Diagnosis not present

## 2012-01-08 DIAGNOSIS — R109 Unspecified abdominal pain: Secondary | ICD-10-CM

## 2012-01-08 LAB — BASIC METABOLIC PANEL
Calcium: 10.1 mg/dL (ref 8.4–10.5)
Creatinine, Ser: 1.2 mg/dL (ref 0.4–1.2)
GFR: 55.56 mL/min — ABNORMAL LOW (ref >60.00)
Sodium: 140 mEq/L (ref 135–145)

## 2012-01-08 LAB — CBC WITH DIFFERENTIAL/PLATELET
Basophils Relative: 0.5 % (ref 0.0–3.0)
Eosinophils Relative: 2.6 % (ref 0.0–5.0)
Lymphocytes Relative: 36.2 % (ref 12.0–46.0)
Neutrophils Relative %: 50.4 % (ref 43.0–77.0)
RBC: 4.17 Mil/uL (ref 3.87–5.11)
WBC: 5.1 10*3/uL (ref 4.5–10.5)

## 2012-01-08 LAB — URINALYSIS, ROUTINE W REFLEX MICROSCOPIC
Ketones, ur: NEGATIVE
Specific Gravity, Urine: <=1.005 (ref 1.000–1.030)
Urine Glucose: NEGATIVE
Urobilinogen, UA: 0.2 (ref 0.0–1.0)

## 2012-01-08 LAB — HEPATIC FUNCTION PANEL
ALT: 18 U/L (ref 0–35)
Total Bilirubin: 0.8 mg/dL (ref 0.3–1.2)
Total Protein: 8 g/dL (ref 6.0–8.3)

## 2012-01-08 LAB — LIPID PANEL
Cholesterol: 342 mg/dL — ABNORMAL HIGH (ref 0–200)
Total CHOL/HDL Ratio: 4
Triglycerides: 93 mg/dL (ref 0.0–149.0)
VLDL: 18.6 mg/dL (ref 0.0–40.0)

## 2012-01-08 LAB — IBC PANEL
Iron: 107 ug/dL (ref 42–145)
Saturation Ratios: 29.9 % (ref 20.0–50.0)
Transferrin: 255.7 mg/dL (ref 212.0–360.0)

## 2012-01-08 MED ORDER — METOPROLOL SUCCINATE ER 25 MG PO TB24: 25.0000 mg | ORAL_TABLET | Freq: Every day | ORAL | Status: DC

## 2012-01-08 NOTE — Patient Instructions (Addendum)
Your Toprol XL was refilled today at one pill per day Continue all other medications as before Please go to LAB in the Basement for the blood and/or urine tests to be done today You will be contacted by phone if any changes need to be made immediately.  Otherwise, you will receive a letter about your results with an explanation. You will be contacted regarding the referral for: abdomen ultrasound for the kidney Please remember to sign up for My Chart at your earliest convenience, as this will be important to you in the future with finding out test results. If the kidney function is not improved, we may need to refer to Renal You had the flu shot, and the pneumonia shots today

## 2012-01-08 NOTE — Assessment & Plan Note (Signed)
stable overall by hx and exam, most recent data reviewed with pt, and pt to continue medical treatment as before Lab Results  Component Value Date   CHOL 342* 01/08/2012   HDL 88.50 01/08/2012   LDLDIRECT 223.7 01/08/2012   TRIG 93.0 01/08/2012   CHOLHDL 4 01/08/2012

## 2012-01-08 NOTE — Assessment & Plan Note (Signed)
stable overall by hx and exam, most recent data reviewed with pt, and pt to continue medical treatment as before BP Readings from Last 3 Encounters:  01/08/12 122/80  10/08/11 120/60  10/07/11 116/68

## 2012-01-08 NOTE — Assessment & Plan Note (Signed)
stable overall by hx and exam, most recent data reviewed with pt, and pt to continue medical treatment as before Lab Results  Component Value Date   CREATININE 1.2 01/08/2012   Consider refer renal

## 2012-01-08 NOTE — Assessment & Plan Note (Signed)
stable overall by hx and exam, most recent data reviewed with pt, and pt to continue medical treatment as before Lab Results  Component Value Date   HGBA1C 5.8 01/08/2012

## 2012-01-08 NOTE — Assessment & Plan Note (Signed)
Also due for f/u vit d

## 2012-01-08 NOTE — Assessment & Plan Note (Signed)
stable overall by hx and exam, most recent data reviewed with pt, and pt to continue medical treatment as before, for lab f/u today

## 2012-01-08 NOTE — Progress Notes (Signed)
Subjective:    Patient ID: Lindsay Doyle, female    DOB: [birth year], 78 y.o.   MRN: [id number]  HPI  Here to f/u; overall doing ok,  Pt denies chest pain, increased sob or doe, wheezing, orthopnea, PND, increased LE swelling, palpitations, dizziness or syncope.  Pt denies new neurological symptoms such as new headache, or facial or extremity weakness or numbness   Pt denies polydipsia, polyuria, or low sugar symptoms such as weakness or confusion improved with po intake.  Pt states overall good compliance with meds, trying to follow lower cholesterol, diabetic diet, wt overall stable but little exercise however.  No overt bleeding or bruising.  Cont's to take the iron. Taking whole tab of toprol XL as her BP at home was mild elev.  Overall good compliance with treatment, and good medicine tolerability.   Pt denies fever, wt loss, night sweats, loss of appetite, or other constitutional symptoms  Denies worsening depressive symptoms, suicidal ideation, or panic Past Medical History  Diagnosis Date  . HYPERCHOLESTEROLEMIA 09/17/2009  . HYPERLIPIDEMIA 03/09/2007  . MITRAL VALVE PROLAPSE 03/09/2007  . ANEMIA-IRON DEFICIENCY 03/09/2007  . LEUKOPENIA, MILD 05/30/2008  . HYPERTENSION 11/12/2006  . COPD 03/09/2007  . OSTEOARTHRITIS 02/19/2009    Spinal  . OSTEOPOROSIS 11/12/2006  . COLONIC POLYPS, HX OF 03/09/2007  . HYPERGLYCEMIA 07/02/2007  . ASYMPTOMATIC POSTMENOPAUSAL STATUS 07/17/2008  . PSVT 05/03/2009  . Positive H. pylori test 10/1996  . WBC decreased     chronic  . Cataract   . Blood transfusion     3 transfusions   Past Surgical History  Procedure Date  . Abdominal hysterectomy   . Tonsillectomy   . Tubal ligation   . Cataract extraction   . Cardiac catheterization 1992    S/P in 1992 at [facility] in [place]. This was negative for any coronary artery disease  . Carotid duplex 08/29/2003  . Electrocardiogram 06/04/2006  . Ceasarean     reports that she has never  smoked. She does not have any smokeless tobacco history on file. She reports that she does not drink alcohol or use illicit drugs. family history includes Cancer in her brother and mother and Sarcoidosis in her other and sister. Allergies  Allergen Reactions  . Azithromycin   . Cefuroxime Axetil   . Clarithromycin   . Hydrocod Polst-Cpm Polst Er   . Metronidazole   . Rofecoxib     REACTION: unspecified   Current Outpatient Prescriptions on File Prior to Visit  Medication Sig Dispense Refill  . b complex vitamins tablet Take 1 tablet by mouth 2 (two) times a week.       . Calcium Carbonate-Vitamin D (CALTRATE 600+D) 600-400 MG-UNIT per tablet Take 1 tablet by mouth daily.        . Iron 66 MG TABS Take 1 tablet by mouth every other day.      . Multiple Vitamin (MULTIVITAMIN) tablet Take 1 tablet by mouth daily.         Review of Systems  Constitutional: Negative for diaphoresis and unexpected weight change.  HENT: Negative for tinnitus.   Eyes: Negative for photophobia and visual disturbance.  Respiratory: Negative for choking and stridor.   Gastrointestinal: Negative for vomiting and blood in stool.  Genitourinary: Negative for hematuria and decreased urine volume.  Musculoskeletal: Negative for gait problem.  Skin: Negative for color change and wound.  Neurological: Negative for tremors and numbness.  Psychiatric/Behavioral: Negative for decreased concentration. The patient is  not hyperactive.       Objective:   Physical Exam BP 122/80  Pulse 82  Temp 97 F (36.1 C) (Oral)  Ht 5\' 4"  (1.626 m)  Wt 110 lb 6 oz (50.066 kg)  BMI 18.95 kg/m2  SpO2 99% Physical Exam  VS noted Constitutional: Pt appears well-developed and well-nourished.  HENT: Head: Normocephalic.  Right Ear: External ear normal.  Left Ear: External ear normal.  Eyes: Conjunctivae and EOM are normal. Pupils are equal, round, and reactive to light.  Neck: Normal range of motion. Neck supple.  Cardiovascular:  Normal rate and regular rhythm.   Pulmonary/Chest: Effort normal and breath sounds normal.  Abd:  Soft, NT, +BS, no HSM Neurological: Pt is alert. Not confused  Skin: Skin is warm. No erythema.  Psychiatric: Pt behavior is normal. Thought content normal. 1+ nervous    Assessment & Plan:

## 2012-01-09 ENCOUNTER — Encounter: Payer: Self-pay | Admitting: Internal Medicine

## 2012-01-09 ENCOUNTER — Telehealth: Payer: Self-pay | Admitting: Internal Medicine

## 2012-01-09 ENCOUNTER — Ambulatory Visit
Admission: RE | Admit: 2012-01-09 | Discharge: 2012-01-09 | Disposition: A | Discharge status: Home / Self Care | Payer: Medicare Other | Source: Ambulatory Visit | Attending: Internal Medicine | Admitting: Internal Medicine

## 2012-01-09 DIAGNOSIS — R109 Unspecified abdominal pain: Secondary | ICD-10-CM

## 2012-01-09 DIAGNOSIS — N289 Disorder of kidney and ureter, unspecified: Secondary | ICD-10-CM

## 2012-01-09 DIAGNOSIS — R197 Diarrhea, unspecified: Secondary | ICD-10-CM | POA: Diagnosis not present

## 2012-01-09 MED ORDER — SIMVASTATIN 20 MG PO TABS: 20.0000 mg | ORAL_TABLET | Freq: Every day | ORAL | Status: DC

## 2012-01-09 NOTE — Telephone Encounter (Signed)
Message copied by Biagio Borg on Fri Jan 09, 2012  5:21 PM ------      Message from: [name]      Created: Fri Jan 09, 2012  3:25 PM       Called the patient and she is undecided on the lipitor at this time.  She did want to know if there were any other choices.

## 2012-01-09 NOTE — Telephone Encounter (Signed)
Ok for simvastatin 20 qd - done erx

## 2012-01-12 NOTE — Telephone Encounter (Signed)
Patient informed. 

## 2012-03-31 DIAGNOSIS — I214 Non-ST elevation (NSTEMI) myocardial infarction: Secondary | ICD-10-CM

## 2012-03-31 HISTORY — DX: Non-ST elevation (NSTEMI) myocardial infarction: I21.4

## 2012-04-13 ENCOUNTER — Other Ambulatory Visit: Payer: Self-pay | Admitting: Obstetrics and Gynecology

## 2012-04-13 DIAGNOSIS — Z124 Encounter for screening for malignant neoplasm of cervix: Secondary | ICD-10-CM | POA: Diagnosis not present

## 2012-04-13 DIAGNOSIS — Z1231 Encounter for screening mammogram for malignant neoplasm of breast: Secondary | ICD-10-CM | POA: Diagnosis not present

## 2012-04-23 ENCOUNTER — Inpatient Hospital Stay (HOSPITAL_COMMUNITY)
Admission: EM | Admit: 2012-04-23 | Discharge: 2012-04-24 | DRG: 282 | Disposition: A | Discharge status: Home / Self Care | Payer: Medicare Other | Attending: Internal Medicine | Admitting: Internal Medicine

## 2012-04-23 ENCOUNTER — Encounter (HOSPITAL_COMMUNITY): Payer: Self-pay | Admitting: *Deleted

## 2012-04-23 ENCOUNTER — Encounter (HOSPITAL_COMMUNITY)
Admission: EM | Disposition: A | Discharge status: Home / Self Care | Payer: Self-pay | Source: Home / Self Care | Attending: Internal Medicine

## 2012-04-23 ENCOUNTER — Emergency Department (HOSPITAL_COMMUNITY): Payer: Medicare Other

## 2012-04-23 DIAGNOSIS — E785 Hyperlipidemia, unspecified: Secondary | ICD-10-CM | POA: Diagnosis present

## 2012-04-23 DIAGNOSIS — J984 Other disorders of lung: Secondary | ICD-10-CM | POA: Diagnosis not present

## 2012-04-23 DIAGNOSIS — I4891 Unspecified atrial fibrillation: Secondary | ICD-10-CM | POA: Diagnosis not present

## 2012-04-23 DIAGNOSIS — Z7901 Long term (current) use of anticoagulants: Secondary | ICD-10-CM | POA: Diagnosis not present

## 2012-04-23 DIAGNOSIS — R748 Abnormal levels of other serum enzymes: Secondary | ICD-10-CM | POA: Diagnosis not present

## 2012-04-23 DIAGNOSIS — E7800 Pure hypercholesterolemia, unspecified: Secondary | ICD-10-CM | POA: Diagnosis present

## 2012-04-23 DIAGNOSIS — I2510 Atherosclerotic heart disease of native coronary artery without angina pectoris: Secondary | ICD-10-CM | POA: Diagnosis present

## 2012-04-23 DIAGNOSIS — M810 Age-related osteoporosis without current pathological fracture: Secondary | ICD-10-CM | POA: Diagnosis present

## 2012-04-23 DIAGNOSIS — R0789 Other chest pain: Secondary | ICD-10-CM | POA: Diagnosis not present

## 2012-04-23 DIAGNOSIS — Z79899 Other long term (current) drug therapy: Secondary | ICD-10-CM | POA: Diagnosis not present

## 2012-04-23 DIAGNOSIS — J4489 Other specified chronic obstructive pulmonary disease: Secondary | ICD-10-CM | POA: Diagnosis present

## 2012-04-23 DIAGNOSIS — I499 Cardiac arrhythmia, unspecified: Secondary | ICD-10-CM | POA: Diagnosis not present

## 2012-04-23 DIAGNOSIS — R42 Dizziness and giddiness: Secondary | ICD-10-CM | POA: Diagnosis not present

## 2012-04-23 DIAGNOSIS — Z888 Allergy status to other drugs, medicaments and biological substances status: Secondary | ICD-10-CM

## 2012-04-23 DIAGNOSIS — R002 Palpitations: Secondary | ICD-10-CM | POA: Diagnosis not present

## 2012-04-23 DIAGNOSIS — Z881 Allergy status to other antibiotic agents status: Secondary | ICD-10-CM | POA: Diagnosis not present

## 2012-04-23 DIAGNOSIS — I472 Ventricular tachycardia: Secondary | ICD-10-CM | POA: Diagnosis not present

## 2012-04-23 DIAGNOSIS — I214 Non-ST elevation (NSTEMI) myocardial infarction: Secondary | ICD-10-CM | POA: Diagnosis not present

## 2012-04-23 DIAGNOSIS — J449 Chronic obstructive pulmonary disease, unspecified: Secondary | ICD-10-CM | POA: Diagnosis present

## 2012-04-23 DIAGNOSIS — I10 Essential (primary) hypertension: Secondary | ICD-10-CM | POA: Diagnosis present

## 2012-04-23 DIAGNOSIS — R079 Chest pain, unspecified: Secondary | ICD-10-CM

## 2012-04-23 HISTORY — DX: Personal history of other medical treatment: Z92.89

## 2012-04-23 HISTORY — DX: Unspecified atrial fibrillation: I48.91

## 2012-04-23 HISTORY — DX: Gastrointestinal hemorrhage, unspecified: K92.2

## 2012-04-23 HISTORY — DX: Non-ST elevation (NSTEMI) myocardial infarction: I21.4

## 2012-04-23 HISTORY — DX: Atherosclerotic heart disease of native coronary artery without angina pectoris: I25.10

## 2012-04-23 HISTORY — PX: LEFT HEART CATHETERIZATION WITH CORONARY ANGIOGRAM: SHX5451

## 2012-04-23 LAB — POCT I-STAT TROPONIN I: Troponin i, poc: 0.18 ng/mL (ref 0.00–0.08)

## 2012-04-23 LAB — CBC WITH DIFFERENTIAL/PLATELET
Basophils Absolute: 0 10*3/uL (ref 0.0–0.1)
Basophils Relative: 0 % (ref 0–1)
Eosinophils Relative: 3 % (ref 0–5)
HCT: 35.1 % — ABNORMAL LOW (ref 36.0–46.0)
MCHC: 34.8 g/dL (ref 30.0–36.0)
MCV: 96.7 fL (ref 78.0–100.0)
Monocytes Absolute: 0.5 10*3/uL (ref 0.1–1.0)
RDW: 12.3 % (ref 11.5–15.5)

## 2012-04-23 LAB — TROPONIN I: Troponin I: 1.3 ng/mL (ref <0.30)

## 2012-04-23 LAB — POCT I-STAT, CHEM 8
Calcium, Ion: 1.12 mmol/L — ABNORMAL LOW (ref 1.13–1.30)
Chloride: 106 mEq/L (ref 96–112)
HCT: 37 % (ref 36.0–46.0)
Hemoglobin: 12.6 g/dL (ref 12.0–15.0)
Potassium: 3.7 mEq/L (ref 3.5–5.1)

## 2012-04-23 LAB — COMPREHENSIVE METABOLIC PANEL
AST: 18 U/L (ref 0–37)
Albumin: 3.3 g/dL — ABNORMAL LOW (ref 3.5–5.2)
Calcium: 8.5 mg/dL (ref 8.4–10.5)
Creatinine, Ser: 1.06 mg/dL (ref 0.50–1.10)
GFR calc non Af Amer: 50 mL/min — ABNORMAL LOW (ref >90)

## 2012-04-23 LAB — APTT: aPTT: 36 seconds (ref 24–37)

## 2012-04-23 LAB — PROTIME-INR: INR: 1.03 (ref 0.00–1.49)

## 2012-04-23 SURGERY — LEFT HEART CATHETERIZATION WITH CORONARY ANGIOGRAM
Anesthesia: LOCAL

## 2012-04-23 MED ORDER — FERROUS SULFATE 325 (65 FE) MG PO TABS
325.0000 mg | ORAL_TABLET | ORAL | Status: DC
  Administered 2012-04-24: 325 mg via ORAL
  Filled 2012-04-23: qty 1

## 2012-04-23 MED ORDER — NITROGLYCERIN 0.2 MG/ML ON CALL CATH LAB
INTRAVENOUS | Status: AC
  Filled 2012-04-23: qty 1

## 2012-04-23 MED ORDER — HEPARIN (PORCINE) IN NACL 2-0.9 UNIT/ML-% IJ SOLN
INTRAMUSCULAR | Status: AC
  Filled 2012-04-23: qty 1000

## 2012-04-23 MED ORDER — LIDOCAINE HCL (PF) 1 % IJ SOLN
INTRAMUSCULAR | Status: AC
  Filled 2012-04-23: qty 30

## 2012-04-23 MED ORDER — ACETAMINOPHEN 325 MG PO TABS: 650.0000 mg | ORAL_TABLET | ORAL | Status: DC | PRN

## 2012-04-23 MED ORDER — ONE-DAILY MULTI VITAMINS PO TABS: 1.0000 | ORAL_TABLET | Freq: Every day | ORAL | Status: DC

## 2012-04-23 MED ORDER — ASPIRIN 81 MG PO CHEW
324.0000 mg | CHEWABLE_TABLET | Freq: Once | ORAL | Status: AC
  Administered 2012-04-23: 324 mg via ORAL
  Filled 2012-04-23: qty 4

## 2012-04-23 MED ORDER — B COMPLEX-C PO TABS
1.0000 | ORAL_TABLET | Freq: Every day | ORAL | Status: DC
  Administered 2012-04-24: 09:00:00 1 via ORAL
  Filled 2012-04-23 (×2): qty 1

## 2012-04-23 MED ORDER — SODIUM CHLORIDE 0.9 % IV SOLN
INTRAVENOUS | Status: AC
  Administered 2012-04-23: 17:00:00 via INTRAVENOUS

## 2012-04-23 MED ORDER — ADENOSINE 6 MG/2ML IV SOLN
6.0000 mg | Freq: Once | INTRAVENOUS | Status: AC
  Administered 2012-04-23: 6 mg via INTRAVENOUS
  Filled 2012-04-23: qty 2

## 2012-04-23 MED ORDER — ACETAMINOPHEN 500 MG PO TABS
1000.0000 mg | ORAL_TABLET | Freq: Four times a day (QID) | ORAL | Status: DC | PRN
  Filled 2012-04-23: qty 2

## 2012-04-23 MED ORDER — APIXABAN 2.5 MG PO TABS
2.5000 mg | ORAL_TABLET | Freq: Two times a day (BID) | ORAL | Status: DC
  Filled 2012-04-23 (×2): qty 1

## 2012-04-23 MED ORDER — PANTOPRAZOLE SODIUM 40 MG PO TBEC: 40.0000 mg | DELAYED_RELEASE_TABLET | Freq: Every day | ORAL | Status: DC

## 2012-04-23 MED ORDER — METOPROLOL TARTRATE 25 MG PO TABS
25.0000 mg | ORAL_TABLET | Freq: Three times a day (TID) | ORAL | Status: DC
  Administered 2012-04-24: 25 mg via ORAL
  Filled 2012-04-23 (×4): qty 1

## 2012-04-23 MED ORDER — OFF THE BEAT BOOK
Freq: Once | Status: AC
  Administered 2012-04-23: 22:00:00
  Filled 2012-04-23: qty 1

## 2012-04-23 MED ORDER — MIDAZOLAM HCL 2 MG/2ML IJ SOLN
INTRAMUSCULAR | Status: AC
  Filled 2012-04-23: qty 2

## 2012-04-23 MED ORDER — ADULT MULTIVITAMIN W/MINERALS CH
1.0000 | ORAL_TABLET | Freq: Every day | ORAL | Status: DC
  Administered 2012-04-24: 1 via ORAL
  Filled 2012-04-23 (×2): qty 1

## 2012-04-23 MED ORDER — ONDANSETRON HCL 4 MG/2ML IJ SOLN: 4.0000 mg | Freq: Four times a day (QID) | INTRAMUSCULAR | Status: DC | PRN

## 2012-04-23 MED ORDER — CALCIUM CARBONATE-VITAMIN D 600-400 MG-UNIT PO TABS: 1.0000 | ORAL_TABLET | Freq: Every day | ORAL | Status: DC

## 2012-04-23 MED ORDER — HEPARIN BOLUS VIA INFUSION
3000.0000 [IU] | Freq: Once | INTRAVENOUS | Status: AC
  Administered 2012-04-23: 3000 [IU] via INTRAVENOUS

## 2012-04-23 MED ORDER — NITROGLYCERIN 0.4 MG SL SUBL: 0.4000 mg | SUBLINGUAL_TABLET | SUBLINGUAL | Status: DC | PRN

## 2012-04-23 MED ORDER — CALCIUM CARBONATE-VITAMIN D 500-200 MG-UNIT PO TABS
1.0000 | ORAL_TABLET | Freq: Every day | ORAL | Status: DC
  Administered 2012-04-24: 09:00:00 1 via ORAL
  Filled 2012-04-23 (×2): qty 1

## 2012-04-23 MED ORDER — HEPARIN SODIUM (PORCINE) 5000 UNIT/ML IJ SOLN: 60.0000 [IU]/kg | Freq: Once | INTRAMUSCULAR | Status: DC

## 2012-04-23 MED ORDER — HEPARIN (PORCINE) IN NACL 100-0.45 UNIT/ML-% IJ SOLN
600.0000 [IU]/h | INTRAMUSCULAR | Status: DC
  Administered 2012-04-23 (×2): 600 [IU]/h via INTRAVENOUS
  Filled 2012-04-23: qty 250

## 2012-04-23 MED ORDER — ATORVASTATIN CALCIUM 20 MG PO TABS
20.0000 mg | ORAL_TABLET | Freq: Every day | ORAL | Status: DC
  Administered 2012-04-23: 20:00:00 20 mg via ORAL
  Filled 2012-04-23 (×2): qty 1

## 2012-04-23 MED ORDER — FENTANYL CITRATE 0.05 MG/ML IJ SOLN
INTRAMUSCULAR | Status: AC
  Filled 2012-04-23: qty 2

## 2012-04-23 MED ORDER — IRON 66 MG PO TABS: 1.0000 | ORAL_TABLET | ORAL | Status: DC

## 2012-04-23 MED ORDER — B COMPLEX PO TABS: 1.0000 | ORAL_TABLET | ORAL | Status: DC

## 2012-04-23 NOTE — Interval H&P Note (Signed)
History and Physical Interval Note:  04/23/2012 3:21 PM  Lindsay Doyle  has presented today for surgery, with the diagnosis of chest pain  The various methods of treatment have been discussed with the patient and family. After consideration of risks, benefits and other options for treatment, the patient has consented to  Procedure(s) (LRB) with comments: LEFT HEART CATHETERIZATION WITH CORONARY ANGIOGRAM (N/A) as a surgical intervention .  The patient's history has been reviewed, patient examined, no change in status, stable for surgery.  I have reviewed the patient's chart and labs.  Questions were answered to the patient's satisfaction.     Drayke Grabel [employer]

## 2012-04-23 NOTE — ED Provider Notes (Signed)
History     CSN: [id number]  Arrival date & time 04/23/12  1125   First MD Initiated Contact with Patient 04/23/12 1132      Chief Complaint  Patient presents with  . Chest Pain    (Consider location/radiation/quality/duration/timing/severity/associated sxs/prior treatment) HPI Lindsay Doyle is a 79 y.o. female who presents to ED complaining of palpitations, dizziness, left sided chest tightness. States symptoms started at 9am. States has hx of the same. Tried to drink cold drink, bear down, valsava, and breath deeply, all with no relief. States after symptoms were not improving she called 911. States she is not dizzy any longer while layind down flat, but still having chest pain and palpitations. Has had recent URI symptoms, and anemia treatment requiring iron and blood transfusions. Per EMS, pt received 20mg  of cardizem iv and was started on a drip. No improvement in heart rate.    Past Medical History  Diagnosis Date  . HYPERCHOLESTEROLEMIA 09/17/2009  . HYPERLIPIDEMIA 03/09/2007  . MITRAL VALVE PROLAPSE 03/09/2007  . ANEMIA-IRON DEFICIENCY 03/09/2007  . LEUKOPENIA, MILD 05/30/2008  . HYPERTENSION 11/12/2006  . COPD 03/09/2007  . OSTEOARTHRITIS 02/19/2009    Spinal  . OSTEOPOROSIS 11/12/2006  . COLONIC POLYPS, HX OF 03/09/2007  . HYPERGLYCEMIA 07/02/2007  . ASYMPTOMATIC POSTMENOPAUSAL STATUS 07/17/2008  . PSVT 05/03/2009  . Positive H. pylori test 10/1996  . WBC decreased     chronic  . Cataract   . Blood transfusion     3 transfusions    Past Surgical History  Procedure Date  . Abdominal hysterectomy   . Tonsillectomy   . Tubal ligation   . Cataract extraction   . Cardiac catheterization 1992    S/P in 1992 at [facility] in [place]. This was negative for any coronary artery disease  . Carotid duplex 08/29/2003  . Electrocardiogram 06/04/2006  . Ceasarean     Family History  Problem Relation Age of Onset  . Cancer Mother     stomach cancer  .  Sarcoidosis Sister   . Cancer Brother     colon cancer  . Sarcoidosis Other     History  Substance Use Topics  . Smoking status: Never Smoker   . Smokeless tobacco: Not on file  . Alcohol Use: No    OB History    Grav Para Term Preterm Abortions TAB SAB Ect Mult Living                  Review of Systems  Constitutional: Negative for fever and chills.  Respiratory: Positive for chest tightness and shortness of breath.   Cardiovascular: Positive for chest pain and palpitations. Negative for leg swelling.  Gastrointestinal: Negative for nausea and vomiting.  Neurological: Positive for dizziness and light-headedness.  All other systems reviewed and are negative.    Allergies  Azithromycin; Cefuroxime axetil; Clarithromycin; Hydrocod polst-cpm polst er; Metronidazole; and Rofecoxib  Home Medications   Current Outpatient Rx  Name  Route  Sig  Dispense  Refill  . B COMPLEX PO TABS   Oral   Take 1 tablet by mouth 2 (two) times a week.          [name] CALCIUM CARBONATE-VITAMIN D 600-400 MG-UNIT PO TABS   Oral   Take 1 tablet by mouth daily.           . IRON 66 MG PO TABS   Oral   Take 1 tablet by mouth every other day.         [name]  METOPROLOL SUCCINATE ER 25 MG PO TB24   Oral   Take 1 tablet (25 mg total) by mouth daily.   90 tablet   3   . ONE-DAILY MULTI VITAMINS PO TABS   Oral   Take 1 tablet by mouth daily.           [name] SIMVASTATIN 20 MG PO TABS   Oral   Take 1 tablet (20 mg total) by mouth daily.   90 tablet   3     BP 82/44  Pulse 125  Temp 97.4 F (36.3 C) (Oral)  Resp 18  SpO2 100%  Physical Exam  Nursing note and vitals reviewed. Constitutional: She is oriented to person, place, and time. She appears well-developed and well-nourished.       Uncomfortable appearing laying flat with eyes closed  Eyes: Conjunctivae normal are normal.  Neck: Neck supple.  Cardiovascular: Intact distal pulses and normal pulses.  An irregular rhythm present.   Extrasystoles are present. Tachycardia present.   Pulmonary/Chest: Effort normal and breath sounds normal. No respiratory distress. She has no wheezes. She has no rales.  Abdominal: Soft. Bowel sounds are normal. She exhibits no distension. There is no tenderness. There is no rebound.  Musculoskeletal: She exhibits no edema.  Neurological: She is alert and oriented to person, place, and time.  Skin: Skin is warm and dry.  Psychiatric: She has a normal mood and affect. Her behavior is normal.    ED Course  Procedures (including critical care time) 11:56 AM Pt seen and examined ECG consistent with afib with RVR vs SVT with ectopics. Pt already received cardizem 20mg  IV per ems and on a drip. Drip stopped. Pt's BP in 80s. Will start fluids. Dr. Alvino Chapel aware.   12:29 PM Tried adenosin with no improvement 6mg . Pt spontaneously converted about 39min later. Labs pening.   Results for orders placed during the hospital encounter of 04/23/12  CBC WITH DIFFERENTIAL      Component Value Range   WBC 4.7  4.0 - 10.5 K/uL   RBC 3.63 (*) 3.87 - 5.11 MIL/uL   Hemoglobin 12.2  12.0 - 15.0 g/dL   HCT 35.1 (*) 36.0 - 46.0 %   MCV 96.7  78.0 - 100.0 fL   MCH 33.6  26.0 - 34.0 pg   MCHC 34.8  30.0 - 36.0 g/dL   RDW 12.3  11.5 - 15.5 %   Platelets 198  150 - 400 K/uL   Neutrophils Relative 54  43 - 77 %   Neutro Abs 2.5  1.7 - 7.7 K/uL   Lymphocytes Relative 32  12 - 46 %   Lymphs Abs 1.5  0.7 - 4.0 K/uL   Monocytes Relative 10  3 - 12 %   Monocytes Absolute 0.5  0.1 - 1.0 K/uL   Eosinophils Relative 3  0 - 5 %   Eosinophils Absolute 0.1  0.0 - 0.7 K/uL   Basophils Relative 0  0 - 1 %   Basophils Absolute 0.0  0.0 - 0.1 K/uL  COMPREHENSIVE METABOLIC PANEL      Component Value Range   Sodium 139  135 - 145 mEq/L   Potassium 3.6  3.5 - 5.1 mEq/L   Chloride 104  96 - 112 mEq/L   CO2 23  19 - 32 mEq/L   Glucose, Bld 153 (*) 70 - 99 mg/dL   BUN 12  6 - 23 mg/dL   Creatinine, Ser 1.06  0.50 -  1.10  mg/dL   Calcium 8.5  8.4 - 10.5 mg/dL   Total Protein 6.0  6.0 - 8.3 g/dL   Albumin 3.3 (*) 3.5 - 5.2 g/dL   AST 18  0 - 37 U/L   ALT 12  0 - 35 U/L   Alkaline Phosphatase 48  39 - 117 U/L   Total Bilirubin 0.3  0.3 - 1.2 mg/dL   GFR calc non Af Amer 50 (*) >90 mL/min   GFR calc Af Amer 58 (*) >90 mL/min  PROTIME-INR      Component Value Range   Prothrombin Time 13.4  11.6 - 15.2 seconds   INR 1.03  0.00 - 1.49  APTT      Component Value Range   aPTT 36  24 - 37 seconds  POCT I-STAT, CHEM 8      Component Value Range   Sodium 139  135 - 145 mEq/L   Potassium 3.7  3.5 - 5.1 mEq/L   Chloride 106  96 - 112 mEq/L   BUN 12  6 - 23 mg/dL   Creatinine, Ser 1.20 (*) 0.50 - 1.10 mg/dL   Glucose, Bld 157 (*) 70 - 99 mg/dL   Calcium, Ion 1.12 (*) 1.13 - 1.30 mmol/L   TCO2 24  0 - 100 mmol/L   Hemoglobin 12.6  12.0 - 15.0 g/dL   HCT 37.0  36.0 - 46.0 %  POCT I-STAT TROPONIN I      Component Value Range   Troponin i, poc 0.18 (*) 0.00 - 0.08 ng/mL   Comment 3            Dg Chest Portable 1 View  04/23/2012  *RADIOLOGY REPORT*  Clinical Data: Chest pain.  PORTABLE CHEST - 1 VIEW  Comparison: 09/13/2010.  Findings: The heart is enlarged but stable.  There is tortuosity and mild ectasia of the thoracic aorta.  There are chronic lung changes with probable emphysema and interstitial scarring.  Low lung volumes with vascular crowding.  No pleural effusion or focal infiltrate.  IMPRESSION:  1.  Stable cardiac enlargement. 2.  Chronic lung changes. 3.  Low lung volumes with vascular crowding.   Original Report Authenticated By: Marijo Sanes, M.D.     Date: 04/23/2012  Rate: 159  Rhythm: atrial fibrillation  QRS Axis: normal  Intervals: normal  ST/T Wave abnormalities: nonspecific ST changes and early repolarization  Conduction Disutrbances:none  Narrative Interpretation:   Old EKG Reviewed: changes noted new a-fib with RVR compared to 09/13/10  Recheck after converted ECG:  Date:  04/23/2012  Rate: 88  Rhythm: normal sinus rhythm  QRS Axis: normal  Intervals: QT prolonged  ST/T Wave abnormalities: nonspecific T wave changes  Conduction Disutrbances:none  Narrative Interpretation:   Old EKG Reviewed: unchanged     Troponin elevated at 0.18, aspirin given, started heparin per pharmacy. Cardiology contacted will see.    3:30 PM Pt seen by cardiology, taken to cath lab.  Pt remains in NSR.     1. Atrial fibrillation with RVR   2. Elevated troponin   3. Chest pain   4. NSTEMI (non-ST elevated myocardial infarction)       MDM  Pt with chest pain, afib rvr, converted on her own, but did receive cardizem and adenosin with no immediate results. Pt admitted to [facility]. Filed Vitals:   04/23/12 1135 04/23/12 1200  BP: 82/44 93/48  Pulse: 125 29  Temp: 97.4 F (36.3 C)   TempSrc: Oral   Resp: 18  20  SpO2: 100% 100%            Renold Genta, PA 04/23/12 1535

## 2012-04-23 NOTE — Progress Notes (Signed)
D/w Dr. [name] since cath is done - plan to start apixaban 2.5mg  BID tomorrow AM, hold off on any further heparin post-cath. Francisca Langenderfer PA-C

## 2012-04-23 NOTE — Progress Notes (Signed)
ANTICOAGULATION CONSULT NOTE - Initial Consult  Pharmacy Consult for Heparin Indication: chest pain/ACS  Allergies  Allergen Reactions  . Azithromycin Other (See Comments)    Reaction unknown  . Cefuroxime Axetil Other (See Comments)    Reaction unknown  . Clarithromycin Other (See Comments)    Reaction unknown  . Hydrocod Polst-Cpm Polst Er Other (See Comments)    Reaction unknown  . Metronidazole Other (See Comments)    Reaction unknown  . Rofecoxib     REACTION: unspecified    Patient Measurements:   Heparin Dosing Weight: 50kg  Vital Signs: Temp: 97.4 F (36.3 C) (01/24 1135) Temp src: Oral (01/24 1135) BP: 93/48 mmHg (01/24 1200) Pulse Rate: 29  (01/24 1200)  Labs:  Basename 04/23/12 1238  HGB 12.6  HCT 37.0  PLT --  APTT --  LABPROT --  INR --  HEPARINUNFRC --  CREATININE 1.20*  CKTOTAL --  CKMB --  TROPONINI --    The CrCl is unknown because both a height and weight (above a minimum accepted value) are required for this calculation.   Medical History: Past Medical History  Diagnosis Date  . HYPERCHOLESTEROLEMIA 09/17/2009  . MITRAL VALVE PROLAPSE 03/09/2007  . ANEMIA-IRON DEFICIENCY 03/09/2007  . LEUKOPENIA, MILD 05/30/2008  . HYPERTENSION 11/12/2006  . COPD 03/09/2007  . OSTEOARTHRITIS 02/19/2009    Spinal  . OSTEOPOROSIS 11/12/2006  . COLONIC POLYPS, HX OF 03/09/2007  . HYPERGLYCEMIA 07/02/2007  . ASYMPTOMATIC POSTMENOPAUSAL STATUS 07/17/2008  . PSVT     a. Remotely, in setting of anemia.  [name] Positive H. pylori test 10/1996  . WBC decreased     chronic  . Cataract   . Blood transfusion     3 transfusions  . GI bleed     a. 05/2010: EGD 05/2010 mild gastritis, small hiatal hernia, Schatzki ring, negative colonoscopy; capsule endoscopy 06/2010 - possible nonspecific inflammatory changes.    Medications: pending  Assessment: CP 79 y/o female admitted to ER with CP. Cardizem dripped started by EMS, adenosine given in ER. Troponin 0.18. CBC WNL  (noted h/o GIB 2012). Scr 1.2.  Goal of Therapy:  Heparin level 0.3-0.7 units/ml Monitor platelets by anticoagulation protocol: Yes   Plan:  Heparin 3000 unit IV bolus with infusion 600 units/hr (12 units/kg/hr) Check heparin level in 8 hrs. Daily heparin level and CBC.  Hurshell Dino S. Alford Highland, PharmD, BCPS Clinical Staff Pharmacist Pager [phone]  [place], [place] 04/23/2012,1:04 PM

## 2012-04-23 NOTE — Plan of Care (Signed)
Problem: Phase III Progression Outcomes Goal: Sinus rhythm established or heart rate < 100 at rest Outcome: Completed/Met Date Met:  04/23/12 Tele shows pt has been in SR 70's since arrival to unit post cath at 1700.

## 2012-04-23 NOTE — ED Notes (Addendum)
Report given to cath lab staff, patient family placed in waiting room

## 2012-04-23 NOTE — ED Notes (Addendum)
Patient with chest pain this am, patient states aching in chest and reflux feeling, patient received cardizem 20 mg bolus followed by 5 mg/hr drip initiated per EMS

## 2012-04-23 NOTE — H&P (Signed)
History and Physical  Patient ID: Lindsay Doyle MRN: [id number], DOB: 01-18-1934 Date of Encounter: 04/23/2012, 1:57 PM Primary Physician: Cathlean Cower, MD Primary Cardiologist: Johnsie Cancel remotely  Chief Complaint: palpitations, dizziness, L sided chest tightness Reason for Admit: afib RVR, EKG changes and abnormal troponin  HPI: Lindsay Doyle is a 79 y/o F with history of HL (LDL 223), remote MVP not seen on 2011 echo with normal EF, remote SVT (in the setting of anemia from "female problems" s/p hysterectomy), and GI bleed/anemia s/p blood transfusion in 05/2010 with neg colonscopy, EGD with mild gastritis, capsule endoscopy 06/2010 with possible nonspecific inflammatory changes. Lindsay Doyle presented to [facility] today with palpitations and dizziness. Lindsay Doyle has a history of infrequent palpitations - was evaluated in [facility] ER 08/2010 without obvious cause and since that time has only had 1 other episode that was aborted with vagal maneuvers. Last night, Lindsay Doyle took some tylenol for mild sore throat and nasal congestion. When Lindsay Doyle awoke this AM, Lindsay Doyle developed gradual onset of tachypalpitations associated with L sided "indigestion"-type chest burning with associated back discomfort. Lindsay Doyle has had this CP before with her palpitations. Lindsay Doyle had 3 soft BMs but no frank diarrhea. No SOB, diaphoresis, nausea, or vomiting. Lindsay Doyle tried carotid massage, deep breathing, and cold water without relief. Her husband called EMS. Lindsay Doyle was found be in afib RVR in the field so got 20mg  of IV diltiazem by EMS. When Lindsay Doyle arrived to the ER, Lindsay Doyle was hypotensive at 82/44. Lindsay Doyle was given 6mg  of adenosine by EDP with transient slowing of her HR into the 60s, but went back up. Around 10 minutes later Lindsay Doyle spontaneously converted to NSR on her own. Lindsay Doyle feels much better and is without complaints. No orthopnea, LEE, fever, syncope, hematemesis, melena although Lindsay Doyle has chronically darker stools since eating leafy greens and taking iron supplements. Lindsay Doyle  [facility] and did so 1 week ago without any CP, SOB, palps or dizziness (did not go this week due to weather).  BP now improved to 130/81. Labs significant for elevated POC troponin of 0.18, glu 157. Hgb 12.2. CXR: stable cardiac enlargement, chronic lung changes, low lung volumes with vascular crowding.  Past Medical History  Diagnosis Date  . HYPERCHOLESTEROLEMIA 09/17/2009  . MITRAL VALVE PROLAPSE 03/09/2007    Remotely - last echo 2011 did not show this  . ANEMIA-IRON DEFICIENCY 03/09/2007  . LEUKOPENIA, MILD 05/30/2008  . HYPERTENSION 11/12/2006  . COPD 03/09/2007    ? pt is unsure  . OSTEOARTHRITIS 02/19/2009    Spinal  . OSTEOPOROSIS 11/12/2006  . COLONIC POLYPS, HX OF 03/09/2007  . HYPERGLYCEMIA 07/02/2007  . ASYMPTOMATIC POSTMENOPAUSAL STATUS 07/17/2008  . PSVT     a. Remotely, in setting of anemia.  [name] Positive H. pylori test 10/1996  . WBC decreased     chronic  . Cataract   . Blood transfusion     a. 05/2010: 3 transfusions for anemia/GIB.  [name] GI bleed     a. 05/2010: Hgb 7 at outside hospital s/p 3 u PRBC, EGD 05/2010 mild gastritis, small hiatal hernia, Schatzki ring, negative colonoscopy; capsule endoscopy 06/2010 - possible nonspecific inflammatory changes.   Last received iron infusion 1 yr ago. Last blood transfusion (x3) at time of GIB in 05/2010- note that her Hgb was 7 at outside hospital [facility] - at that time Lindsay Doyle received 3 units PRBC), so this lab value is not in Epic.  Most Recent Cardiac Studies: 2D  Echo Study Conclusions - Left ventricle: The cavity size was normal. Wall thickness was normal. Systolic function was normal. The estimated ejection fraction was in the range of 55% to 60%. - Aortic valve: Not well seen but probably trileaflet Mild regurgitation. - Right atrium: The atrium was mildly dilated. - Atrial septum: No defect or patent foramen ovale was identified. - Tricuspid valve: Mild-moderate  regurgitation.   Surgical History:  Past Surgical History  Procedure Date  . Abdominal hysterectomy   . Tonsillectomy   . Tubal ligation   . Cataract extraction   . Cardiac catheterization 1992    S/P in 1992 at [facility] in [place]. This was negative for any coronary artery disease  . Carotid duplex 08/29/2003  . Ceasarean      Home Meds: Prior to Admission medications   Medication Sig Start Date End Date Taking? Authorizing Provider  acetaminophen (TYLENOL) 500 MG tablet Take 1,000 mg by mouth every 6 (six) hours as needed. For pain/fever   Yes Historical Provider, MD  b complex vitamins tablet Take 1 tablet by mouth 2 (two) times a week.    Yes Historical Provider, MD  Calcium Carbonate-Vitamin D (CALTRATE 600+D) 600-400 MG-UNIT per tablet Take 1 tablet by mouth daily.     Yes Historical Provider, MD  Iron 66 MG TABS Take 1 tablet by mouth every other day.   Yes Historical Provider, MD  metoprolol succinate (TOPROL-XL) 25 MG 24 hr tablet Take 25 mg by mouth daily. 01/08/12  Yes Biagio Borg, MD  Multiple Vitamin (MULTIVITAMIN) tablet Take 1 tablet by mouth daily.     Yes Historical Provider, MD    Allergies:  Allergies  Allergen Reactions  . Azithromycin Other (See Comments)    diarrhea  . Cefuroxime Axetil Other (See Comments)    diarrhea  . Clarithromycin Other (See Comments)    diarrhea  . Hydrocod Polst-Cpm Polst Er Other (See Comments)    Reaction unknown  . Metronidazole Other (See Comments)    Pt had difficulty swallowing this and says it was "horrible" to take. No allergic reaction.  . Rofecoxib     REACTION: unspecified    History   Social History  . Marital Status: Married    Spouse Name: N/A    Number of Children: N/A  . Years of Education: N/A   Occupational History  .      retired   Social History Main Topics  . Smoking status: Never Smoker   . Smokeless tobacco: Not on file  . Alcohol Use: No  . Drug Use: No  .  Sexually Active: Not on file   Other Topics Concern  . Not on file   Social History Narrative  . No narrative on file     Family History  Problem Relation Age of Onset  . Cancer Mother     stomach cancer  . Sarcoidosis Sister   . Cancer Brother     colon cancer  . Sarcoidosis Other   . CAD Neg Hx     Review of Systems: General: negative for chills, fever, night sweats or weight changes.  Cardiovascular: see above Dermatological: negative for rash Respiratory: negative for cough or wheezing Urologic: negative for hematuria Abdominal: negative for nausea, vomiting, bright red blood per rectum, melena, or hematemesis Neurologic: negative for visual changes, syncope All other systems reviewed and are otherwise negative except as noted above.  Labs:   Lab Results  Component Value Date  WBC 4.7 04/23/2012   HGB 12.6 04/23/2012   HCT 37.0 04/23/2012   MCV 96.7 04/23/2012   PLT 198 04/23/2012     Lab 04/23/12 1238 04/23/12 1210  NA 139 --  K 3.7 --  CL 106 --  CO2 -- 23  BUN 12 --  CREATININE 1.20* --  CALCIUM -- 8.5  PROT -- 6.0  BILITOT -- 0.3  ALKPHOS -- 48  ALT -- 12  AST -- 18  GLUCOSE 157* --   POC troponin 0.18  Lab Results  Component Value Date   CHOL 342* 01/08/2012   HDL 88.50 01/08/2012   TRIG 93.0 01/08/2012   Radiology/Studies:  Dg Chest Portable 1 View 04/23/2012  *RADIOLOGY REPORT*  Clinical Data: Chest pain.  PORTABLE CHEST - 1 VIEW  Comparison: 09/13/2010.  Findings: The heart is enlarged but stable.  There is tortuosity and mild ectasia of the thoracic aorta.  There are chronic lung changes with probable emphysema and interstitial scarring.  Low lung volumes with vascular crowding.  No pleural effusion or focal infiltrate.  IMPRESSION:  1.  Stable cardiac enlargement. 2.  Chronic lung changes. 3.  Low lung volumes with vascular crowding.   Original Report Authenticated By: Marijo Sanes, M.D.     EKG:  11:30: atrial fib 159bpm, downsloping ST  depression II, III, avF, V4-V6 (up to 2 mm in lead II) F/u tracing 12:23: NSR TWI V2-V3, flattening avL, ST depression markedly improved  Physical Exam: Blood pressure 130/81, pulse 82, temperature 97.4 F (36.3 C), temperature source Oral, resp. rate 20, SpO2 100.00%. General: Well developed thin AAF in no acute distress. Head: Normocephalic, atraumatic, sclera non-icteric, no xanthomas, nares are without discharge.  Neck: Negative for carotid bruits. JVP 7-8cm. Lungs: Clear bilaterally to auscultation without wheezes, rales, or rhonchi. Breathing is unlabored. Heart: RRR with S1 S2. No murmurs, rubs, or gallops appreciated. Abdomen: Soft, non-tender, non-distended with normoactive bowel sounds. No hepatomegaly. No rebound/guarding. No obvious abdominal masses. Msk:  Strength and tone appear normal for age. Extremities: No clubbing or cyanosis. No edema.  Distal pedal pulses are 2+ and equal bilaterally. Neuro: Alert and oriented X 3. No focal deficit. No facial asymmetry. Moves all extremities spontaneously. Psych:  Responds to questions appropriately with a normal affect.   ASSESSMENT AND PLAN:   1. Atrial fibrillation with RVR - newly recognized. Lindsay Doyle has had infrequent palpitations in the past but this is her first documented afib. Lindsay Doyle spontaneously converted in the ER and feels better. CHADS2 preliminarily 2 for age/HTN but need to update echo & A1C. See below for comprehensive thoughts. 2. NSTEMI - EKG with significant ST depression in the setting of RVR with associated chest burning. Received 324mg  ASA, initiation of heparin. May need cardiac cath. Will discuss further with MD.  3. HTN - hypotensive earlier in setting of dilt bolus. Improved. 4. Hyperlipidemia - LDL 223. The patient reports being fearful of cholesterol medicine in the past, but "if I have to take it, I will." Lindsay Doyle has been prescribed it before, but has never actually taken it. 4. Hyperglycemia, A1C 5.8 in 12/2011 -  recheck A1C since this plays into decision about CHADS score.  5. GIB/anemia in 2012 with mild gastritis by EGD, neg colonoscopy, nonspecific mild erythema of small bowel by SCE - no evidence of recurrence. Hgb stable. Hemocccult stools and add PPI. 6. Remote PSVT - had 1 event within the past year aborted by vagal maneuvers.  Signed, Melina Copa PA-C 04/23/2012, 1:57 PM  Patient seen and examined with Melina Copa, PA-C. We discussed all aspects of the encounter. I agree with the assessment and plan as stated above.   79 y/o woman with h/o HTN. HL, SVT and remote GIB. Presents with new onset AF with RVR complicated by small NSTEMI (Type II) and marked ST depression on ECG. Now back in SR.   Given NSTEMI and profound ECG changes. We discussed risks and indications of cath vs stress testing with her and her husband and we have decided to proceed with cath.   Will switch Toprol to metoprolol tartrate 25 tid. Check echo. CHADS2 = 2 so would likely benefit from anticoagulation. Would favor low-dose apixaban 2.5 bid on d/c. F/u with Dr. Johnsie Cancel.   Benay Spice 2:34 PM

## 2012-04-23 NOTE — Progress Notes (Signed)
Pt refused most of meds, states didn't "need" protonix and doesn't like to take too many things.  Explained protonix would help burning, reflux and prevention of ulcer.  Refused metoprolol because she already took her Toprol XL this am.  Agreed if RN saw increase in HR or irregularity of rhythm that she would take it then and would definitely take it in am.  Gave info sheets on Eliquis and Lipitor and did take Lipitor.  Gave Off the Beat book.  Pt refused to watch afib or MI educational video.

## 2012-04-23 NOTE — Progress Notes (Signed)
CRITICAL VALUE ALERT  Critical value received: Troponin I 1.3 Date of notification:  04/23/12  Time of notification:  1822  Critical value read back:yes  Nurse who received alert:  Dina Rich RN  MD notified (1st page):  R Arguello PA  Time of first page:  1823  MD notified (2nd page):  Time of second page:  Responding MD: Kennon Holter PA Time MD responded:  [phone]

## 2012-04-23 NOTE — ED Notes (Signed)
Adenosine administered with Dr. Alvino Chapel at bedside, patient rate dropped to 60's and 70's briefly  prior to returning to to 120-150

## 2012-04-23 NOTE — ED Notes (Signed)
Results of troponin shown to [employer]

## 2012-04-23 NOTE — CV Procedure (Signed)
   Cardiac Catheterization Procedure Note  Name: Lindsay Doyle MRN: [id number] DOB: [birth year]  Procedure: Left Heart Cath, Selective Coronary Angiography, LV angiography  Indication: Chest pain, atrial fibrillation, NSTEMI   Procedural details: The right groin was prepped, draped, and anesthetized with 1% lidocaine. Using modified Seldinger technique, a 5 French sheath was introduced into the right femoral artery. MP catheter was used for coronary angiography and left ventriculography. Catheter exchanges were performed over a guidewire. Mynx closure device was placed.  There were no immediate procedural complications. The patient was transferred to the post catheterization recovery area for further monitoring.    Procedural Findings: Hemodynamics:  AO 152/80 LV 143/12   Coronary angiography: Coronary dominance: right  Left mainstem: No significant stenosis.   Left anterior descending (LAD): Serial 30% and 60% mid LAD stenoses.   Left circumflex (LCx): Luminal irregularities.  Right coronary artery (RCA): 30% ostial RCA stenosis.  Luminal irregularities in the rest of the vessel.   Left ventriculography: Hand ventriculogram with normal LV systolic function, EF 123456 and no regional wall motion abnormalities noted.   Final Conclusions:  Most significant lesion was a moderate 60% mid LAD stenosis.  No severe stenoses.  Suspect atrial fibrillation with RVR was primary trigger of chest pain and elevated troponin (demand ischemia).    Recommendations: Medical management.  Would start apixaban 2.5 mg bid tomorrow morning for atrial fibrillation.  Needs to begin on statin given moderate CAD.   Loralie Champagne 04/23/2012, 3:56 PM

## 2012-04-24 ENCOUNTER — Encounter (HOSPITAL_COMMUNITY): Payer: Self-pay | Admitting: Physician Assistant

## 2012-04-24 ENCOUNTER — Other Ambulatory Visit: Payer: Self-pay | Admitting: Physician Assistant

## 2012-04-24 DIAGNOSIS — I4891 Unspecified atrial fibrillation: Secondary | ICD-10-CM

## 2012-04-24 DIAGNOSIS — I214 Non-ST elevation (NSTEMI) myocardial infarction: Secondary | ICD-10-CM

## 2012-04-24 DIAGNOSIS — I2510 Atherosclerotic heart disease of native coronary artery without angina pectoris: Secondary | ICD-10-CM

## 2012-04-24 LAB — BASIC METABOLIC PANEL
BUN: 11 mg/dL (ref 6–23)
CO2: 26 mEq/L (ref 19–32)
Chloride: 106 mEq/L (ref 96–112)
Glucose, Bld: 92 mg/dL (ref 70–99)
Potassium: 3.7 mEq/L (ref 3.5–5.1)

## 2012-04-24 LAB — CBC
Hemoglobin: 12.1 g/dL (ref 12.0–15.0)
MCH: 33.6 pg (ref 26.0–34.0)
MCHC: 34.8 g/dL (ref 30.0–36.0)

## 2012-04-24 LAB — LIPID PANEL
HDL: 89 mg/dL (ref >39)
LDL Cholesterol: 138 mg/dL — ABNORMAL HIGH (ref 0–99)
Total CHOL/HDL Ratio: 2.7 RATIO
Triglycerides: 87 mg/dL (ref <150)
VLDL: 17 mg/dL (ref 0–40)

## 2012-04-24 MED ORDER — APIXABAN 2.5 MG PO TABS: 2.5000 mg | ORAL_TABLET | Freq: Two times a day (BID) | ORAL | Status: DC

## 2012-04-24 MED ORDER — PANTOPRAZOLE SODIUM 40 MG PO TBEC: 40.0000 mg | DELAYED_RELEASE_TABLET | Freq: Every day | ORAL | Status: DC

## 2012-04-24 MED ORDER — NITROGLYCERIN 0.4 MG SL SUBL: 0.4000 mg | SUBLINGUAL_TABLET | SUBLINGUAL | Status: DC | PRN

## 2012-04-24 MED ORDER — METOPROLOL SUCCINATE ER 50 MG PO TB24: 50.0000 mg | ORAL_TABLET | Freq: Every day | ORAL | Status: DC

## 2012-04-24 MED ORDER — ATORVASTATIN CALCIUM 20 MG PO TABS: 20.0000 mg | ORAL_TABLET | Freq: Every day | ORAL | Status: DC

## 2012-04-24 NOTE — Progress Notes (Signed)
Patient ID: Lindsay Doyle, female   DOB: May 31, 1933, 79 y.o.   MRN: [id number]  Primary Cardiologist:  Jeffie Pollock [name]  Subjective:  Denies SSCP, palpitations or Dyspnea   Objective:  Filed Vitals:   04/23/12 2025 04/24/12 0010 04/24/12 0500 04/24/12 0728  BP: 116/76 127/63 142/78 156/80  Pulse: 88 74 84 78  Temp: 98.3 F (36.8 C) 98.3 F (36.8 C) 98.7 F (37.1 C) 97.9 F (36.6 C)  TempSrc: Oral Oral Oral Oral  Resp: 18 13 16 16   Weight:  118 lb 9.7 oz (53.8 kg)    SpO2: 99% 100% 100% 100%    Intake/Output from previous day:  Intake/Output Summary (Last 24 hours) at 04/24/12 0839 Last data filed at 04/24/12 0330  Gross per 24 hour  Intake    495 ml  Output    400 ml  Net     95 ml    Physical Exam:   Lab Results: Basic Metabolic Panel:  Basename 04/24/12 0630 04/23/12 1238 04/23/12 1210  NA 142 139 --  K 3.7 3.7 --  CL 106 106 --  CO2 26 -- 23  GLUCOSE 92 157* --  BUN 11 12 --  CREATININE 0.99 1.20* --  CALCIUM 9.2 -- 8.5  MG -- -- --  PHOS -- -- --   Liver Function Tests:  Basename 04/23/12 1210  AST 18  ALT 12  ALKPHOS 48  BILITOT 0.3  PROT 6.0  ALBUMIN 3.3*   CBC:  Basename 04/24/12 0630 04/23/12 1238 04/23/12 1210  WBC 6.4 -- 4.7  NEUTROABS -- -- 2.5  HGB 12.1 12.6 --  HCT 34.8* 37.0 --  MCV 96.7 -- 96.7  PLT 190 -- 198   Cardiac Enzymes:  Basename 04/24/12 0502 04/23/12 2324 04/23/12 1715  CKTOTAL -- -- --  CKMB -- -- --  CKMBINDEX -- -- --  TROPONINI 1.33* 2.20* 1.30*   Hemoglobin A1C:  Basename 04/23/12 1715  HGBA1C 5.6   Fasting Lipid Panel:  Basename 04/24/12 0630  CHOL 244*  HDL 89  LDLCALC 138*  TRIG 87  CHOLHDL 2.7  LDLDIRECT --   Thyroid Function Tests:  Basename 04/23/12 1715  TSH 0.988  T4TOTAL --  T3FREE --  THYROIDAB --    Imaging: Dg Chest Portable 1 View  04/23/2012  *RADIOLOGY REPORT*  Clinical Data: Chest pain.  PORTABLE CHEST - 1 VIEW  Comparison: 09/13/2010.  Findings: The heart is  enlarged but stable.  There is tortuosity and mild ectasia of the thoracic aorta.  There are chronic lung changes with probable emphysema and interstitial scarring.  Low lung volumes with vascular crowding.  No pleural effusion or focal infiltrate.  IMPRESSION:  1.  Stable cardiac enlargement. 2.  Chronic lung changes. 3.  Low lung volumes with vascular crowding.   Original Report Authenticated By: Marijo Sanes, M.D.     Cardiac Studies:   Telemetry:  NSR no afib  Medications:     . apixaban  2.5 mg Oral BID  . atorvastatin  20 mg Oral q1800  . B-complex with vitamin C  1 tablet Oral Daily  . calcium-vitamin D  1 tablet Oral Daily  . ferrous sulfate  325 mg Oral QODAY  . metoprolol tartrate  25 mg Oral Q8H  . multivitamin with minerals  1 tablet Oral Daily  . pantoprazole  40 mg Oral Daily       Assessment/Plan:  PAF:  Converted to NSR continue beta blocker  Start apixaban tomorrow give leg time  to heal.   Chol:  Statin started check labs in 3 months SEMI:  ECG changes and enzyme bump when in rapid afib  Moderate non obstructive disease by cath medical Rx GERD:  Continue pantoprazole  D/C home F/U Porfirio Oar and clinic new novel agent use.  F/U Dr [name] in 4 weeks   Jenkins Rouge 04/24/2012, 8:39 AM

## 2012-04-24 NOTE — Discharge Summary (Signed)
Discharge Summary   Patient ID: Lindsay Doyle,  MRN: [id number], DOB/AGE: March 06, 1934 79 y.o.  Admit date: 04/23/2012 Discharge date: 04/24/2012   Primary Care Physician:  Cathlean Cower   Primary Cardiologist:  Dr. Loralie Champagne   Reason for Admission:  Atrial fibrillation with RVR  Primary Discharge Diagnoses:  1. Atrial Fibrillation with RVR - converted to NSR; Apixaban started 2. Non-STEMI - Type II secondary to demand ischemia from AFib with RVR 3. Coronary Artery Disease - nonobstructive by cardiac catheterization 4. Hyperlipidemia - statin initiated  Secondary Discharge Diagnoses:   Past Medical History  Diagnosis Date  . HYPERCHOLESTEROLEMIA 09/17/2009  . MITRAL VALVE PROLAPSE 03/09/2007    Remotely - last echo 2011 did not show this  . ANEMIA-IRON DEFICIENCY 03/09/2007  . LEUKOPENIA, MILD 05/30/2008  . HYPERTENSION 11/12/2006  . COPD 03/09/2007    ? pt is unsure  . OSTEOARTHRITIS 02/19/2009    Spinal  . OSTEOPOROSIS 11/12/2006  . COLONIC POLYPS, HX OF 03/09/2007  . HYPERGLYCEMIA 07/02/2007  . ASYMPTOMATIC POSTMENOPAUSAL STATUS 07/17/2008  . PSVT     a. Remotely, in setting of anemia.  [name] Positive H. pylori test 10/1996  . Cataract   . Blood transfusion     a. 05/2010: 3 transfusions for anemia/GIB.  [name] GI bleed     a. 05/2010: Hgb 7 at outside hospital s/p 3 u PRBC, EGD 05/2010 mild gastritis, small hiatal hernia, Schatzki ring, negative colonoscopy; capsule endoscopy 06/2010 - possible nonspecific inflammatory changes.  [name] Hx of echocardiogram     a. echo 2/11:  EF 55-60%, mild AI, mild RAE, mild to mod TR  . NSTEMI (non-ST elevated myocardial infarction) 03/2012    a. Type II in setting of AF with RVR 03/2012  . Atrial fibrillation     a. Apixaban started 03/2012  . CAD (coronary artery disease)     a. [facility] 1/14:  mLAD serial 30 and 60%, oRCA 30%, EF 60%     Allergies:    Allergies  Allergen Reactions  . Azithromycin Other (See Comments)    diarrhea  . Cefuroxime Axetil  Other (See Comments)    diarrhea  . Clarithromycin Other (See Comments)    diarrhea  . Hydrocod Polst-Cpm Polst Er Other (See Comments)    Reaction unknown  . Metronidazole Other (See Comments)    Pt had difficulty swallowing this and says it was "horrible" to take. No allergic reaction.  . Rofecoxib     REACTION: unspecified     Procedures Performed This Admission:    1. Cardiac Catheterization 04/23/12 : Coronary angiography:  Coronary dominance: right  Left mainstem: No significant stenosis.  Left anterior descending (LAD): Serial 30% and 60% mid LAD stenoses.  Left circumflex (LCx): Luminal irregularities.  Right coronary artery (RCA): 30% ostial RCA stenosis. Luminal irregularities in the rest of the vessel.  Left ventriculography: Hand ventriculogram with normal LV systolic function, EF 123456 and no regional wall motion abnormalities noted.  Final Conclusions: Most significant lesion was a moderate 60% mid LAD stenosis. No severe stenoses. Suspect atrial fibrillation with RVR was primary trigger of chest pain and elevated troponin (demand ischemia).  Recommendations: Medical management. Would start apixaban 2.5 mg bid tomorrow morning for atrial fibrillation. Needs to begin on statin given moderate CAD.  Study Conclusions   Hospital Course: Lindsay Doyle is a 79 y.o. female with a history of HTN, HL and reported mitral valve prolapse , anemia in the setting of menorrhagia as well as a  prior history of GI bleeding status post blood transfusion in 05/2010 (negative colonoscopy, EGD with mild gastritis, capsule endoscopy 4/12 with possible nonspecific inflammatory changes). She presented to the hospital with palpitations and chest pain. She was noted to be in atrial fibrillation with RVR by EMS and given 20 mg of IV diltiazem which resulted in hypotension. She spontaneously converted to NSR after she failed one dose of adenosine 6 mg IVP. She ruled in for myocardial infarction by  enzymes. She underwent cardiac catheterization which demonstrated moderate nonobstructive disease as noted above. It was felt that her non-STEMI was likely related to demand ischemia from atrial fibrillation with RVR. Today, she remains in normal sinus rhythm. She has significant thromboembolic risk factors and would benefit from long-term anticoagulation. She will start Eliquis 2.5 mg twice daily. Given her recent cardiac catheterization, this will be started tomorrow. She will be placed on statin therapy for hyperlipidemia and coronary artery disease. She'll be kept on proton pump inhibitor therapy given her history of GI bleeding and now need for anticoagulation therapy. Echo was not completed as an inpatient and can be done as an outpatient.  She was on Lopressor 25 mg Q 8 hrs inpatient and has Toprol XL at home.  Will send her home Toprol XL 50 mg QD.  She was seen by Dr. Johnsie Cancel and felt to be stable and ready for discharge to home.  Discharge Vitals: Blood pressure 156/80, pulse 78, temperature 97.9 F (36.6 C), temperature source Oral, resp. rate 16, weight 118 lb 9.7 oz (53.8 kg), SpO2 100.00%.  Labs:  [facility] 04/24/12 0630 04/23/12 1238 04/23/12 1210  WBC 6.4 -- 4.7  HGB 12.1 12.6 12.2  HCT 34.8* 37.0 35.1*  MCV 96.7 -- 96.7  PLT 190 -- 198    Basename 04/24/12 0630 04/23/12 1238 04/23/12 1210  NA 142 139 139  K 3.7 3.7 3.6  CL 106 106 104  CO2 26 -- 23  BUN 11 12 12   CREATININE 0.99 1.20* 1.06  CALCIUM 9.2 -- 8.5  PROT -- -- 6.0  BILITOT -- -- 0.3  ALKPHOS -- -- 48  ALT -- -- 12  AST -- -- 18    Basename 04/24/12 0502 04/23/12 2324 04/23/12 1715  CKTOTAL -- -- --  CKMB -- -- --  TROPONINI 1.33* 2.20* 1.30*   Lab Results  Component Value Date   CHOL 244* 04/24/2012   HDL 89 04/24/2012   LDLCALC 138* 04/24/2012   TRIG 87 04/24/2012   No results found for this basename: DDIMER   Lab Results  Component Value Date   TSH 0.988 04/23/2012    Basename 04/23/12 1210    INR 1.03    Diagnostic Procedures and Studies:  Dg Chest Portable 1 View  04/23/2012    IMPRESSION:  1.  Stable cardiac enlargement. 2.  Chronic lung changes. 3.  Low lung volumes with vascular crowding.   Original Report Authenticated By: Marijo Sanes, M.D.    Disposition:   Pt is being discharged home today in good condition.  Follow-up Plans & Appointments      Follow-up Information    Follow up with Loralie Champagne, MD. In 4 weeks. (office will call to schedule)    Contact information:   [address] [place] [address] [phone]       Follow up with [facility]). (Anticoagulation Clinic - office will call to schedule appointment to discuss blood thinner)  Contact information:   [address], [place] [phone]      Follow up with [facility]. (office will call to schedule an Echocardiogram )    Contact information:   [address] [place] [address]          Discharge Medications    Medication List     As of 04/24/2012  9:24 AM    TAKE these medications         acetaminophen 500 MG tablet   Commonly known as: TYLENOL   Take 1,000 mg by mouth every 6 (six) hours as needed. For pain/fever      apixaban 2.5 MG Tabs tablet   Commonly known as: ELIQUIS   Take 1 tablet (2.5 mg total) by mouth 2 (two) times daily.   Start taking on: 04/25/2012      atorvastatin 20 MG tablet   Commonly known as: LIPITOR   Take 1 tablet (20 mg total) by mouth daily at 6 PM.      b complex vitamins tablet   Take 1 tablet by mouth 2 (two) times a week.      CALTRATE 600+D 600-400 MG-UNIT per tablet   Generic drug: Calcium Carbonate-Vitamin D   Take 1 tablet by mouth daily.      Iron 66 MG Tabs   Take 1 tablet by mouth every other day.      metoprolol succinate 50 MG 24 hr tablet   Commonly known as: TOPROL-XL   Take 1 tablet (50 mg  total) by mouth daily.      multivitamin tablet   Take 1 tablet by mouth daily.      nitroGLYCERIN 0.4 MG SL tablet   Commonly known as: NITROSTAT   Place 1 tablet (0.4 mg total) under the tongue every 5 (five) minutes x 3 doses as needed for chest pain.      pantoprazole 40 MG tablet   Commonly known as: PROTONIX   Take 1 tablet (40 mg total) by mouth daily.         Outstanding Labs/Studies  1. 2-D echocardiogram as an outpatient 2. Lipids and LFTs in 3 months  Duration of Discharge Encounter: Greater than 30 minutes including physician and PA time.  Danton Sewer, PA-C  9:24 AM 04/24/2012

## 2012-04-24 NOTE — Progress Notes (Signed)
NCM spoke to pt and provided pt with Eliquis trial card and patient safety information. Pt is scheduled to take the medication on 1/26 or 1/27. She will call on Monday to activate her card. Instructed pt to review patient safety information for medication and report side effects to her physician as soon as possible. Jonnie Finner RN CCM Case Mgmt phone [phone]

## 2012-04-26 ENCOUNTER — Telehealth: Payer: Self-pay | Admitting: Cardiology

## 2012-04-26 NOTE — ED Provider Notes (Signed)
Medical screening examination/treatment/procedure(s) were performed by non-physician practitioner and as supervising physician I was immediately available for consultation/collaboration.  Jasper Riling. Alvino Chapel, MD 04/26/12 1454

## 2012-04-26 NOTE — Telephone Encounter (Signed)
Pt was given rx for eliquis, she has a blood disorder, and wont take it, can change to something else? pt [phone], pls call pt first , uses [facility] [address]

## 2012-04-26 NOTE — Telephone Encounter (Signed)
Pt advised. I offered pt an appt with Dr [name] to discuss starting Eliquis  but she declined. She states she has an appt with Dr Jenny Reichmann tomorrow and wants to discuss starting Eliquis with him. She states she will call back to schedule appts (an echo was ordered to be done as an outpatient) after she has talked with Dr Jenny Reichmann.

## 2012-04-26 NOTE — Telephone Encounter (Signed)
Spoke with pt. Pt states she has a history of a blood disorder that causes GI bleed. She states last hospitalization for GI bleed was March 2012. She also has arthritis and takes medication that may interact with Eliquis. She has not started Eliquis because of these concerns. I will forward to Dr [name] for review.

## 2012-04-26 NOTE — Telephone Encounter (Signed)
She has a history of GI bleeding probably from gastritis, not a blood disorder.  She is on a PPI.  She was started on a low dose of Eliquis, 2.5 mg bid.  Her risk of stroke off Eliquis is probably significantly higher than her risk of GI bleed on Eliquis so I would recommend that she take it.  Her blood count was normal in hospital and she was FOBT negative.  I would be glad to see her in the office to discuss.

## 2012-04-26 NOTE — Progress Notes (Signed)
Utilization Review Completed Havanna Groner J. Navarro Nine, RN, BSN, NCM [phone]  

## 2012-04-27 ENCOUNTER — Telehealth: Payer: Self-pay | Admitting: Cardiology

## 2012-04-27 ENCOUNTER — Ambulatory Visit (INDEPENDENT_AMBULATORY_CARE_PROVIDER_SITE_OTHER): Payer: Medicare Other | Admitting: Internal Medicine

## 2012-04-27 ENCOUNTER — Encounter: Payer: Self-pay | Admitting: Cardiology

## 2012-04-27 ENCOUNTER — Other Ambulatory Visit: Payer: Self-pay | Admitting: *Deleted

## 2012-04-27 ENCOUNTER — Encounter: Payer: Self-pay | Admitting: Internal Medicine

## 2012-04-27 VITALS — BP 120/80 | HR 83 | Temp 97.6°F | Ht 64.0 in | Wt 109.1 lb

## 2012-04-27 DIAGNOSIS — I10 Essential (primary) hypertension: Secondary | ICD-10-CM

## 2012-04-27 DIAGNOSIS — R7309 Other abnormal glucose: Secondary | ICD-10-CM | POA: Diagnosis not present

## 2012-04-27 DIAGNOSIS — E785 Hyperlipidemia, unspecified: Secondary | ICD-10-CM

## 2012-04-27 DIAGNOSIS — R7302 Impaired glucose tolerance (oral): Secondary | ICD-10-CM

## 2012-04-27 DIAGNOSIS — M810 Age-related osteoporosis without current pathological fracture: Secondary | ICD-10-CM | POA: Insufficient documentation

## 2012-04-27 MED ORDER — TACROLIMUS 0.1 % EX OINT: TOPICAL_OINTMENT | Freq: Two times a day (BID) | CUTANEOUS | Status: DC

## 2012-04-27 MED ORDER — FLUTICASONE PROPIONATE 0.05 % EX CREA: TOPICAL_CREAM | CUTANEOUS | Status: DC

## 2012-04-27 MED ORDER — APIXABAN 2.5 MG PO TABS: 2.5000 mg | ORAL_TABLET | Freq: Two times a day (BID) | ORAL | Status: DC

## 2012-04-27 NOTE — Telephone Encounter (Signed)
Call  

## 2012-04-27 NOTE — Assessment & Plan Note (Signed)
stable overall by history and exam, recent data reviewed with pt, and pt to continue medical treatment as before,  to f/u any worsening symptoms or concerns Lab Results  Component Value Date   HGBA1C 5.6 04/23/2012

## 2012-04-27 NOTE — Assessment & Plan Note (Signed)
Last dxa 2003 with t-score -2.5, now for f/u scan

## 2012-04-27 NOTE — Telephone Encounter (Signed)
Error

## 2012-04-27 NOTE — Telephone Encounter (Signed)
Spoke with pharmacist at [facility]. Eliquis does not require prior authorization and is ready for pt to pick up. Spoke with pt and she is aware prescription does not require prior authorization.

## 2012-04-27 NOTE — Assessment & Plan Note (Signed)
stable overall by history and exam, recent data reviewed with pt, and pt to continue medical treatment as before,  to f/u any worsening symptoms or concerns BP Readings from Last 3 Encounters:  04/27/12 120/80  04/24/12 156/80  04/24/12 156/80

## 2012-04-27 NOTE — Assessment & Plan Note (Signed)
stable overall by history and exam, recent data reviewed with pt, and pt to continue medical treatment as before,  to f/u any worsening symptoms or concerns Lab Results  Component Value Date   LDLCALC 138* 04/24/2012

## 2012-04-27 NOTE — Progress Notes (Signed)
Subjective:    Patient ID: Lindsay Doyle, female    DOB: 14-Aug-1933, 79 y.o.   MRN: [id number]  HPI  Here for f/u;  Overall doing ok;  Pt denies CP, worsening SOB, DOE, wheezing, orthopnea, PND, worsening LE edema, palpitations, dizziness or syncope.  Pt denies neurological change such as new headache, facial or extremity weakness.  Pt denies polydipsia, polyuria, or low sugar symptoms. Pt states overall good compliance with treatment and medications, good tolerability, and has been trying to follow lower cholesterol diet.  Pt denies worsening depressive symptoms, suicidal ideation or panic. No fever, night sweats, wt loss, loss of appetite, or other constitutional symptoms.  Pt states good ability with ADL's, has low fall risk, home safety reviewed and adequate, no other significant changes in hearing or vision, and only occasionally active with exercise. Had recent pap/mammo - results normal, has records today Past Medical History  Diagnosis Date  . HYPERCHOLESTEROLEMIA 09/17/2009  . MITRAL VALVE PROLAPSE 03/09/2007    Remotely - last echo 2011 did not show this  . ANEMIA-IRON DEFICIENCY 03/09/2007  . LEUKOPENIA, MILD 05/30/2008  . HYPERTENSION 11/12/2006  . COPD 03/09/2007    ? pt is unsure  . OSTEOARTHRITIS 02/19/2009    Spinal  . OSTEOPOROSIS 11/12/2006  . COLONIC POLYPS, HX OF 03/09/2007  . HYPERGLYCEMIA 07/02/2007  . ASYMPTOMATIC POSTMENOPAUSAL STATUS 07/17/2008  . PSVT     a. Remotely, in setting of anemia.  [name] Positive H. pylori test 10/1996  . Cataract   . Blood transfusion     a. 05/2010: 3 transfusions for anemia/GIB.  [name] GI bleed     a. 05/2010: Hgb 7 at outside hospital s/p 3 u PRBC, EGD 05/2010 mild gastritis, small hiatal hernia, Schatzki ring, negative colonoscopy; capsule endoscopy 06/2010 - possible nonspecific inflammatory changes.  [name] Hx of echocardiogram     a. echo 2/11:  EF 55-60%, mild AI, mild RAE, mild to mod TR  . NSTEMI (non-ST elevated myocardial infarction) 03/2012    a.  Type II in setting of AF with RVR 03/2012  . Atrial fibrillation     a. Apixaban started 03/2012  . CAD (coronary artery disease)     a. [place] 1/14:  mLAD serial 30 and 60%, oRCA 30%, EF 60%   Past Surgical History  Procedure Date  . Abdominal hysterectomy   . Tonsillectomy   . Tubal ligation   . Cataract extraction   . Cardiac catheterization 1992    S/P in 1992 at [facility] in [place]. This was negative for any coronary artery disease  . Carotid duplex 08/29/2003  . Ceasarean     reports that she has never smoked. She does not have any smokeless tobacco history on file. She reports that she does not drink alcohol or use illicit drugs. family history includes Cancer in her brother and mother and Sarcoidosis in her other and sister.  There is no history of CAD. Allergies  Allergen Reactions  . Azithromycin Other (See Comments)    diarrhea  . Cefuroxime Axetil Other (See Comments)    diarrhea  . Clarithromycin Other (See Comments)    diarrhea  . Hydrocod Polst-Cpm Polst Er Other (See Comments)    Reaction unknown  . Metronidazole Other (See Comments)    Pt had difficulty swallowing this and says it was "horrible" to take. No allergic reaction.  . Rofecoxib     REACTION: unspecified   Current Outpatient Prescriptions on File Prior to Visit  Medication Sig Dispense Refill  . acetaminophen (TYLENOL) 500 MG tablet Take 1,000 mg by mouth every 6 (six) hours as needed. For pain/fever      . atorvastatin (LIPITOR) 20 MG tablet Take 1 tablet (20 mg total) by mouth daily at 6 PM.  30 tablet  11  . b complex vitamins tablet Take 1 tablet by mouth 2 (two) times a week.       . Calcium Carbonate-Vitamin D (CALTRATE 600+D) 600-400 MG-UNIT per tablet Take 1 tablet by mouth daily.        . Iron 66 MG TABS Take 1 tablet by mouth every other day.      . metoprolol succinate (TOPROL-XL) 50 MG 24 hr tablet Take 1 tablet (50 mg total) by mouth daily.  30 tablet  11  .  Multiple Vitamin (MULTIVITAMIN) tablet Take 1 tablet by mouth daily.        . nitroGLYCERIN (NITROSTAT) 0.4 MG SL tablet Place 1 tablet (0.4 mg total) under the tongue every 5 (five) minutes x 3 doses as needed for chest pain.  25 tablet  11  . pantoprazole (PROTONIX) 40 MG tablet Take 1 tablet (40 mg total) by mouth daily.  30 tablet  5  . apixaban (ELIQUIS) 2.5 MG TABS tablet Take 1 tablet (2.5 mg total) by mouth 2 (two) times daily.  60 tablet  11    Review of Systems  Constitutional: Negative for unexpected weight change, or unusual diaphoresis  HENT: Negative for tinnitus.   Eyes: Negative for photophobia and visual disturbance.  Respiratory: Negative for choking and stridor.   Gastrointestinal: Negative for vomiting and blood in stool.  Genitourinary: Negative for hematuria and decreased urine volume.  Musculoskeletal: Negative for acute joint swelling Skin: Negative for color change and wound.  Neurological: Negative for tremors and numbness other than noted  Psychiatric/Behavioral: Negative for decreased concentration or  hyperactivity.       Objective:   Physical Exam BP 120/80  Pulse 83  Temp 97.6 F (36.4 C) (Oral)  Ht 5\' 4"  (1.626 m)  Wt 109 lb 2 oz (49.499 kg)  BMI 18.73 kg/m2  SpO2 99% VS noted,  Constitutional: Pt appears well-developed and well-nourished.  HENT: Head: NCAT.  Right Ear: External ear normal.  Left Ear: External ear normal.  Eyes: Conjunctivae and EOM are normal. Pupils are equal, round, and reactive to light.  Neck: Normal range of motion. Neck supple.  Cardiovascular: Normal rate and regular rhythm.   Pulmonary/Chest: Effort normal and breath sounds normal.  Abd:  Soft, NT, non-distended, + BS Neurological: Pt is alert. Not confused  Skin: Skin is warm. No erythema.  Psychiatric: Pt behavior is normal. Thought content normal.     Assessment & Plan:

## 2012-04-27 NOTE — Patient Instructions (Addendum)
Please continue all other medications as before, and refills have been done if requested. Please keep your appointments with your specialists as you have planned- Cardilogy and the Echocardiogram Your skin prescriptions were refilled today Please schedule the bone density test before leaving today at the scheduling desk (where you check out) You are otherwise up to date with prevention measures today. No further lab work needed today Please remember to sign up for My Chart if you have not done so, as this will be important to you in the future with finding out test results, communicating by private email, and scheduling acute appointments online when needed. Please return in 6 months, or sooner if needed

## 2012-04-27 NOTE — Telephone Encounter (Signed)
LMTCB

## 2012-04-27 NOTE — Telephone Encounter (Signed)
Pt rtn your call

## 2012-04-27 NOTE — Telephone Encounter (Signed)
New problem:   Patient calling release from hospital on sat. Need to discuss eliquis. This is not cover by her insurance company. Patient will be taken this medication.

## 2012-04-30 NOTE — Telephone Encounter (Signed)
Pt just noticed right groin site nodule during shower, no redness, no increased pain, no oozing. Explained to that they used a closure device to close the artery called Mynx and it leaves a pea size lump near access site, pt agreed that it felt like that, told her to call with further questions or concerns, she agreed to plan.

## 2012-04-30 NOTE — Telephone Encounter (Signed)
New problem:   Patient is aware that Lindsay Doyle is off today    S/p cath done on 1/24.  C/O nodule by the site - size of a [place] . Please advise.

## 2012-05-13 ENCOUNTER — Other Ambulatory Visit (HOSPITAL_COMMUNITY): Payer: Medicare Other

## 2012-05-20 ENCOUNTER — Ambulatory Visit (HOSPITAL_COMMUNITY): Payer: Medicare Other | Attending: Cardiology

## 2012-05-20 DIAGNOSIS — I4891 Unspecified atrial fibrillation: Secondary | ICD-10-CM

## 2012-05-20 DIAGNOSIS — I2510 Atherosclerotic heart disease of native coronary artery without angina pectoris: Secondary | ICD-10-CM | POA: Insufficient documentation

## 2012-05-20 DIAGNOSIS — E785 Hyperlipidemia, unspecified: Secondary | ICD-10-CM | POA: Diagnosis not present

## 2012-05-20 DIAGNOSIS — I10 Essential (primary) hypertension: Secondary | ICD-10-CM | POA: Diagnosis not present

## 2012-05-20 DIAGNOSIS — I359 Nonrheumatic aortic valve disorder, unspecified: Secondary | ICD-10-CM | POA: Insufficient documentation

## 2012-05-20 DIAGNOSIS — I214 Non-ST elevation (NSTEMI) myocardial infarction: Secondary | ICD-10-CM

## 2012-05-20 DIAGNOSIS — I252 Old myocardial infarction: Secondary | ICD-10-CM | POA: Diagnosis not present

## 2012-05-20 NOTE — Progress Notes (Signed)
Echocardiogram performed.  

## 2012-05-21 ENCOUNTER — Encounter: Payer: Self-pay | Admitting: Physician Assistant

## 2012-05-25 ENCOUNTER — Telehealth: Payer: Self-pay | Admitting: Cardiology

## 2012-05-25 NOTE — Telephone Encounter (Signed)
New Problem:    Patient called in because she cannot wait until 06/01/12 to be seen by Dr. Aundra Doyle.  Please call back.

## 2012-05-25 NOTE — Telephone Encounter (Signed)
Spoke with pt. About 2 days ago pt  noticed 2 knots about the size of a pea  on her left  inner thigh. She states they are not  tender to touch /painful/discolored and they have not changed since she first noticed them. I reviewed with Dr [name]. He recommended pt come to office to have them checked. I have given pt an appt 05/26/12 with [name], PAc.

## 2012-05-25 NOTE — Telephone Encounter (Signed)
New Problem:    Patient has a knot on her left leg and was concerned about it.  Also has a few questions about the blood thinner she was placed on.  Please call back.

## 2012-05-26 ENCOUNTER — Ambulatory Visit (INDEPENDENT_AMBULATORY_CARE_PROVIDER_SITE_OTHER): Payer: Medicare Other | Admitting: Physician Assistant

## 2012-05-26 ENCOUNTER — Encounter: Payer: Self-pay | Admitting: Physician Assistant

## 2012-05-26 VITALS — BP 152/90 | HR 74 | Ht 64.0 in | Wt 110.8 lb

## 2012-05-26 DIAGNOSIS — I809 Phlebitis and thrombophlebitis of unspecified site: Secondary | ICD-10-CM | POA: Diagnosis not present

## 2012-05-26 DIAGNOSIS — I10 Essential (primary) hypertension: Secondary | ICD-10-CM | POA: Diagnosis not present

## 2012-05-26 DIAGNOSIS — I4891 Unspecified atrial fibrillation: Secondary | ICD-10-CM

## 2012-05-26 DIAGNOSIS — E785 Hyperlipidemia, unspecified: Secondary | ICD-10-CM

## 2012-05-26 NOTE — Patient Instructions (Addendum)
06/07/12 FOR FASTING LIPID/ LIVER PANEL, AS WELL AS BMET, CBC W/DIFF  KEEP LEFT LEG ELEVATED USE WARM COMPRESSES TO NODULE ON LEFT LEG 2-3 TIMES DAILY FOR ABOUT 15-20 MINUTES EACH TIME; IF STILL HAVING PAIN OR IF NODULE INCREASES IN SIZE OR REDNESS YOU HAVE BEEN INSTRUCTED TO CALL YOUR PRIMARY CARE PHYSICIAN PER SCOTT WEAVER, PAC  KEEP TRACK OF YOUR BLOOD PRESSURE READINGS AND BRING READINGS TO NEXT VISIT WITH DR. [name]

## 2012-05-26 NOTE — Progress Notes (Signed)
[address], [place], [place]  [address] Phone: [phone], Fax:  [phone]  Date:  05/26/2012   ID:  Lindsay Doyle, Lindsay Doyle 04-07-1933, MRN [id number]  PCP:  Cathlean Cower, MD  Primary Cardiologist:  Dr. Loralie Champagne    History of Present Illness: Lindsay Doyle is a 79 y.o. female who returns for the evaluation of a nodule on her left leg.  She has a hx of HTN, HL, reported MVP and anemia in the setting of menorrhagia as well as a prior history of GI bleeding status post blood transfusion in 05/2010 (negative colonoscopy, EGD with mild gastritis, capsule endoscopy 4/12 with possible nonspecific inflammatory changes).  She was admitted 1/24-1/25 with NSTEMI in the setting of AFib with RVR.  She spontaneously converted to NSR.  LHC demonstrated non-obstructive disease and she was felt to have suffered a Type II NSTEMI.  Eliquis 2.5 mg bid was added due to stroke risk.  Echo 05/20/12: EF 60-65%.  She called in recently with a knot on her leg and she is added on for evaluation.  Since d/c, she is doing well.  No chest pain, palpitations, syncope, orthopnea, PND, edema.  She recently noted the knot on her lower left leg.  It is not painful.  It is not getting any bigger.   Labs (1/14):  K 3.7, creatinine 0.99, ALT 12, LDL 138, Hgb 12.1, TSH 0.988  Wt Readings from Last 3 Encounters:  05/26/12 110 lb 12.8 oz (50.259 kg)  04/27/12 109 lb 2 oz (49.499 kg)  04/24/12 118 lb 9.7 oz (53.8 kg)     Past Medical History  Diagnosis Date  . HYPERCHOLESTEROLEMIA 09/17/2009  . MITRAL VALVE PROLAPSE 03/09/2007    Remotely - last echo 2011 did not show this  . ANEMIA-IRON DEFICIENCY 03/09/2007  . LEUKOPENIA, MILD 05/30/2008  . HYPERTENSION 11/12/2006  . COPD 03/09/2007    ? pt is unsure  . OSTEOARTHRITIS 02/19/2009    Spinal  . OSTEOPOROSIS 11/12/2006  . COLONIC POLYPS, HX OF 03/09/2007  . HYPERGLYCEMIA 07/02/2007  . ASYMPTOMATIC POSTMENOPAUSAL STATUS 07/17/2008  . PSVT     a.  Remotely, in setting of anemia.  [name] Positive H. pylori test 10/1996  . Cataract   . Blood transfusion     a. 05/2010: 3 transfusions for anemia/GIB.  [name] GI bleed     a. 05/2010: Hgb 7 at outside hospital s/p 3 u PRBC, EGD 05/2010 mild gastritis, small hiatal hernia, Schatzki ring, negative colonoscopy; capsule endoscopy 06/2010 - possible nonspecific inflammatory changes.  [name] Hx of echocardiogram     a. echo 2/11:  EF 55-60%, mild AI, mild RAE, mild to mod TR  . NSTEMI (non-ST elevated myocardial infarction) 03/2012    a. Type II in setting of AF with RVR 03/2012  . Atrial fibrillation     a. Apixaban started 03/2012  . CAD (coronary artery disease)     a. LHC 1/14:  mLAD serial 30 and 60%, oRCA 30%, EF 60%  . Hx of echocardiogram     a. Echo 2/14:  Mild focal basal and mild LVH, EF 60-65%, mild AI, mild RAE, trivia effusion    Current Outpatient Prescriptions  Medication Sig Dispense Refill  . acetaminophen (TYLENOL) 500 MG tablet Take 1,000 mg by mouth every 6 (six) hours as needed. For pain/fever      . apixaban (ELIQUIS) 2.5 MG TABS tablet Take 1 tablet (2.5 mg total) by mouth 2 (two) times daily.  60 tablet  11  . atorvastatin (LIPITOR) 20 MG tablet Take 1 tablet (20 mg total) by mouth daily at 6 PM.  30 tablet  11  . b complex vitamins tablet Take 1 tablet by mouth 2 (two) times a week.       . Calcium Carbonate-Vitamin D (CALTRATE 600+D) 600-400 MG-UNIT per tablet Take 1 tablet by mouth daily.        . fluticasone (CUTIVATE) 0.05 % cream Use as directed up to 4 times per day as needed for leg rash  30 g  1  . Iron 66 MG TABS Take 1 tablet by mouth every other day.      . metoprolol succinate (TOPROL-XL) 50 MG 24 hr tablet Take 1 tablet (50 mg total) by mouth daily.  30 tablet  11  . Multiple Vitamin (MULTIVITAMIN) tablet Take 1 tablet by mouth daily.        . nitroGLYCERIN (NITROSTAT) 0.4 MG SL tablet Place 1 tablet (0.4 mg total) under the tongue every 5 (five) minutes x 3 doses as needed  for chest pain.  25 tablet  11  . pantoprazole (PROTONIX) 40 MG tablet Take 1 tablet (40 mg total) by mouth daily.  30 tablet  5  . tacrolimus (PROTOPIC) 0.1 % ointment Apply topically 2 (two) times daily. As needed, thin layer  30 g  1   No current facility-administered medications for this visit.    Allergies:    Allergies  Allergen Reactions  . Azithromycin Other (See Comments)    diarrhea  . Cefuroxime Axetil Other (See Comments)    diarrhea  . Clarithromycin Other (See Comments)    diarrhea  . Hydrocod Polst-Cpm Polst Er Other (See Comments)    Reaction unknown  . Metronidazole Other (See Comments)    Pt had difficulty swallowing this and says it was "horrible" to take. No allergic reaction.  . Rofecoxib     REACTION: unspecified    Social History:  The patient  reports that she has never smoked. She does not have any smokeless tobacco history on file. She reports that she does not drink alcohol or use illicit drugs.   ROS:  Please see the history of present illness.   No bleeding problems.   All other systems reviewed and negative.   PHYSICAL EXAM: VS:  BP 152/90  Pulse 74  Ht 5\' 4"  (1.626 m)  Wt 110 lb 12.8 oz (50.259 kg)  BMI 19.01 kg/m2 Well nourished, well developed, in no acute distress HEENT: normal Neck: no JVD Cardiac:  normal S1, S2; RRR; no murmur Lungs:  clear to auscultation bilaterally, no wheezing, rhonchi or rales Abd: soft, nontender, no hepatomegaly Ext: no edema; right groin without hematoma or bruit; small firm nodular mass medial lower left leg (mid-calf) non-tender  Skin: warm and dry Neuro:  CNs 2-12 intact, no focal abnormalities noted  EKG:  NSR, HR 74, normal axis, LVH, TWI V1-3     ASSESSMENT AND PLAN:  1. Thrombophlebitis:  Suspect superficial thrombophlebitis on left lower leg.  Recommend heat and keeping leg elevated.  No further intervention.  She should see her PCP if this area gets worse.   2. Atrial Fibrillation:  Maintaining  NSR.  Continue with Eliquis.  Obtain BMET and CBC.   3. Hypertension:  Typically better controlled.  She will keep an eye on her BPs and bring list to her next visit.  Continue current rx. 4. Coronary Artery Disease:  No angina.  No  ASA as she is on Eliquis.  Continue statin. 5. Hyperlipidemia:  Continue statin.  Check Lipids and LFTs. 6. History of GI Bleed:  No reports of bleeding.  Will obtain follow up CBC as noted.  7. Disposition:  Keep follow up next month as planned.  Danton Sewer, PA-C  9:46 AM 05/26/2012

## 2012-05-27 ENCOUNTER — Ambulatory Visit (INDEPENDENT_AMBULATORY_CARE_PROVIDER_SITE_OTHER)
Admission: RE | Admit: 2012-05-27 | Discharge: 2012-05-27 | Disposition: A | Discharge status: Home / Self Care | Payer: Medicare Other | Source: Ambulatory Visit | Attending: Internal Medicine | Admitting: Internal Medicine

## 2012-05-27 ENCOUNTER — Other Ambulatory Visit: Payer: Self-pay | Admitting: Internal Medicine

## 2012-05-27 DIAGNOSIS — M810 Age-related osteoporosis without current pathological fracture: Secondary | ICD-10-CM

## 2012-05-27 MED ORDER — ALENDRONATE SODIUM 70 MG PO TABS: 70.0000 mg | ORAL_TABLET | ORAL | Status: DC

## 2012-05-28 ENCOUNTER — Telehealth: Payer: Self-pay | Admitting: Internal Medicine

## 2012-05-28 NOTE — Telephone Encounter (Signed)
Pt is to start taking Fosamax.  She wants to know is she should stop taking the Citracal supplement.

## 2012-05-28 NOTE — Telephone Encounter (Signed)
Ok to cont the citracal

## 2012-05-28 NOTE — Telephone Encounter (Signed)
Patient informed. 

## 2012-06-01 ENCOUNTER — Encounter: Payer: Medicare Other | Admitting: Cardiology

## 2012-06-04 ENCOUNTER — Other Ambulatory Visit: Payer: Medicare Other

## 2012-06-07 ENCOUNTER — Other Ambulatory Visit: Payer: Medicare Other

## 2012-06-08 ENCOUNTER — Other Ambulatory Visit (INDEPENDENT_AMBULATORY_CARE_PROVIDER_SITE_OTHER): Payer: Medicare Other

## 2012-06-08 DIAGNOSIS — E785 Hyperlipidemia, unspecified: Secondary | ICD-10-CM

## 2012-06-08 DIAGNOSIS — I10 Essential (primary) hypertension: Secondary | ICD-10-CM

## 2012-06-08 DIAGNOSIS — I809 Phlebitis and thrombophlebitis of unspecified site: Secondary | ICD-10-CM

## 2012-06-08 LAB — CBC WITH DIFFERENTIAL/PLATELET
Basophils Relative: 0.9 % (ref 0.0–3.0)
Eosinophils Absolute: 0.3 10*3/uL (ref 0.0–0.7)
MCHC: 33.8 g/dL (ref 30.0–36.0)
MCV: 99.2 fl (ref 78.0–100.0)
Monocytes Absolute: 0.5 10*3/uL (ref 0.1–1.0)
Neutro Abs: 1.1 10*3/uL — ABNORMAL LOW (ref 1.4–7.7)
Neutrophils Relative %: 22.8 % — ABNORMAL LOW (ref 43.0–77.0)
RBC: 3.83 Mil/uL — ABNORMAL LOW (ref 3.87–5.11)

## 2012-06-08 LAB — BASIC METABOLIC PANEL
Chloride: 102 mEq/L (ref 96–112)
GFR: 66.1 mL/min (ref >60.00)
Glucose, Bld: 91 mg/dL (ref 70–99)
Potassium: 4.2 mEq/L (ref 3.5–5.1)
Sodium: 139 mEq/L (ref 135–145)

## 2012-06-08 LAB — HEPATIC FUNCTION PANEL
ALT: 17 U/L (ref 0–35)
Bilirubin, Direct: 0.1 mg/dL (ref 0.0–0.3)
Total Bilirubin: 0.8 mg/dL (ref 0.3–1.2)

## 2012-06-08 LAB — LIPID PANEL: VLDL: 13.8 mg/dL (ref 0.0–40.0)

## 2012-06-09 ENCOUNTER — Telehealth: Payer: Self-pay | Admitting: *Deleted

## 2012-06-09 NOTE — Telephone Encounter (Signed)
Message copied by Michae Kava on Wed Jun 09, 2012  9:05 AM ------      Message from: [place], [place] T      Created: Tue Jun 08, 2012  5:27 PM       Lipids look good.  LFTs ok.      Hgb stable.      Potassium and kidney function look good.      Continue with current treatment plan.      [name], PA-C  4:49 PM 02/12/2012 ------

## 2012-06-09 NOTE — Telephone Encounter (Signed)
pt notified about lab results with verbal understanding today.  

## 2012-06-21 ENCOUNTER — Encounter: Payer: Self-pay | Admitting: Cardiology

## 2012-06-21 ENCOUNTER — Ambulatory Visit (INDEPENDENT_AMBULATORY_CARE_PROVIDER_SITE_OTHER): Payer: Medicare Other | Admitting: Cardiology

## 2012-06-21 VITALS — BP 152/88 | HR 80 | Ht 64.0 in | Wt 109.0 lb

## 2012-06-21 DIAGNOSIS — I10 Essential (primary) hypertension: Secondary | ICD-10-CM

## 2012-06-21 DIAGNOSIS — I4891 Unspecified atrial fibrillation: Secondary | ICD-10-CM

## 2012-06-21 DIAGNOSIS — I2510 Atherosclerotic heart disease of native coronary artery without angina pectoris: Secondary | ICD-10-CM | POA: Diagnosis not present

## 2012-06-21 LAB — BASIC METABOLIC PANEL
CO2: 30 mEq/L (ref 19–32)
Chloride: 99 mEq/L (ref 96–112)
Creatinine, Ser: 1.1 mg/dL (ref 0.4–1.2)
Glucose, Bld: 102 mg/dL — ABNORMAL HIGH (ref 70–99)

## 2012-06-21 LAB — CBC WITH DIFFERENTIAL/PLATELET
Basophils Relative: 0.5 % (ref 0.0–3.0)
Eosinophils Absolute: 0.2 10*3/uL (ref 0.0–0.7)
Hemoglobin: 13.6 g/dL (ref 12.0–15.0)
MCHC: 34.8 g/dL (ref 30.0–36.0)
MCV: 98.3 fl (ref 78.0–100.0)
Monocytes Absolute: 0.6 10*3/uL (ref 0.1–1.0)
Neutro Abs: 2.7 10*3/uL (ref 1.4–7.7)
Neutrophils Relative %: 46.2 % (ref 43.0–77.0)
RBC: 3.98 Mil/uL (ref 3.87–5.11)
RDW: 12.7 % (ref 11.5–14.6)

## 2012-06-21 NOTE — Patient Instructions (Addendum)
Your physician recommends that you have lab work today--BMET/CBCd.  Your physician wants you to follow-up in: 4 months with Dr [name]. (July 2014). You will receive a reminder letter in the mail two months in advance. If you don't receive a letter, please call our office to schedule the follow-up appointment.

## 2012-06-22 ENCOUNTER — Telehealth: Payer: Self-pay | Admitting: Cardiology

## 2012-06-22 NOTE — Telephone Encounter (Signed)
Spoke with pt

## 2012-06-22 NOTE — Progress Notes (Signed)
Patient ID: Lindsay Doyle, female   DOB: 12/03/1933, 79 y.o.   MRN: [id number] PCP: Dr. Jenny Reichmann  79 yo with history of paroxysmal atrial fibrillation presents for cardiology followup.  She was admitted in 1/14 with NSTEMI in the setting of AFib with RVR. She spontaneously converted to NSR. LHC demonstrated non-obstructive disease and she was felt to have had demand ischemia. Eliquis 2.5 mg bid was added due to stroke risk. Echo 05/20/12: EF 60-65%.   Recently, she has been stable.  She is in NSR today.  No significant tachypalpitations.  She brings BP readings with her.  For the most part, these are ok.  She exercises at the [facility] with weights and light aerobics.  No chest pain or exertional dyspnea.  No melena or BRBPR on apixaban so far.    Labs (3/14): K 4.2, creatinine 1.0, LDL 84, HDL 82  PMH: 1. Hyperlipidemia 2. Anemia: Fe deficienc 3. HTN  4. Osteoarthritis 5. H/o colonic polyps 6. Osteoporosis 7. GI bleed (3/12): 3 units PRBCs, EGD with mild gastritis, colonoscopy negative, capsule endoscopy with nonspecific inflammatory changes. 8. CAD: LHC (1/14) with serial 30% and 60% LAD stenoses, 30% ostial RCA, EF 60%.  9. Paroxysmal atrial fibrillation.  Echo (2/14) with EF 60-65%, mild focal basal septal hypertrophy, mild AI.  SH: Married, lives with husband in [place].  Has children living out of town.  Nonsmoker.  Retired.   FH: CAD  ROS: All systems reviewed and negative except as per HPI.   Current Outpatient Prescriptions  Medication Sig Dispense Refill  . acetaminophen (TYLENOL) 500 MG tablet Take 1,000 mg by mouth every 6 (six) hours as needed. For pain/fever      . apixaban (ELIQUIS) 2.5 MG TABS tablet Take 1 tablet (2.5 mg total) by mouth 2 (two) times daily.  60 tablet  11  . atorvastatin (LIPITOR) 20 MG tablet Take 1 tablet (20 mg total) by mouth daily at 6 PM.  30 tablet  11  . b complex vitamins tablet Take 1 tablet by mouth 2 (two) times a week.       . Calcium  Carbonate-Vitamin D (CALTRATE 600+D) 600-400 MG-UNIT per tablet Take 1 tablet by mouth daily.        . fluticasone (CUTIVATE) 0.05 % cream Use as directed up to 4 times per day as needed for leg rash  30 g  1  . Iron 66 MG TABS Take 1 tablet by mouth every other day.      . metoprolol succinate (TOPROL-XL) 50 MG 24 hr tablet Take 1 tablet (50 mg total) by mouth daily.  30 tablet  11  . Multiple Vitamin (MULTIVITAMIN) tablet Take 1 tablet by mouth daily.        . nitroGLYCERIN (NITROSTAT) 0.4 MG SL tablet Place 1 tablet (0.4 mg total) under the tongue every 5 (five) minutes x 3 doses as needed for chest pain.  25 tablet  11  . pantoprazole (PROTONIX) 40 MG tablet Take 1 tablet (40 mg total) by mouth daily.  30 tablet  5  . tacrolimus (PROTOPIC) 0.1 % ointment Apply topically 2 (two) times daily. As needed, thin layer  30 g  1   No current facility-administered medications for this visit.    BP 152/88  Pulse 80  Ht 5\' 4"  (1.626 m)  Wt 109 lb (49.442 kg)  BMI 18.7 kg/m2  SpO2 94% General: NAD Neck: No JVD, no thyromegaly or thyroid nodule.  Lungs: Clear to auscultation  bilaterally with normal respiratory effort. CV: Nondisplaced PMI.  Heart regular S1/S2, no S3/S4, no murmur.  No peripheral edema.  No carotid bruit.  Normal pedal pulses.  Abdomen: Soft, nontender, no hepatosplenomegaly, no distention.  Neurologic: Alert and oriented x 3.  Psych: Normal affect. Extremities: No clubbing or cyanosis.   Assessment/Plan:  1. Paroxysmal atrial fibrillation: No recent tachypalpitations.  She is in NSR today.  Continue Toprol XL and apixaban.  She is on lower dosing of apixaban given low weight (49 kg) and history of prior GI bleeding.  I will check a CBC and BMET today.  2. CAD: Nonobstructive.  No chest pain.  Suspect elevated troponin in 1/14 episode was due to demand ischemia from atrial fibrillation with RVR.  Continue statin.   3. HTN: BP fluctuates but seems ok for the most part based on  her readings.    Followup with me in 4 months.   Loralie Champagne 06/22/2012

## 2012-06-22 NOTE — Telephone Encounter (Signed)
New Problem:    Patient called in wanting to ask you one more question.  Please call back.

## 2012-07-16 ENCOUNTER — Encounter: Payer: Self-pay | Admitting: Internal Medicine

## 2012-07-16 ENCOUNTER — Ambulatory Visit (INDEPENDENT_AMBULATORY_CARE_PROVIDER_SITE_OTHER): Payer: Medicare Other | Admitting: Internal Medicine

## 2012-07-16 VITALS — BP 142/80 | HR 75 | Temp 96.8°F | Ht 62.0 in | Wt 110.4 lb

## 2012-07-16 DIAGNOSIS — E785 Hyperlipidemia, unspecified: Secondary | ICD-10-CM | POA: Diagnosis not present

## 2012-07-16 DIAGNOSIS — R7309 Other abnormal glucose: Secondary | ICD-10-CM

## 2012-07-16 DIAGNOSIS — M48061 Spinal stenosis, lumbar region without neurogenic claudication: Secondary | ICD-10-CM | POA: Diagnosis not present

## 2012-07-16 DIAGNOSIS — I10 Essential (primary) hypertension: Secondary | ICD-10-CM | POA: Diagnosis not present

## 2012-07-16 DIAGNOSIS — R7302 Impaired glucose tolerance (oral): Secondary | ICD-10-CM

## 2012-07-16 HISTORY — DX: Spinal stenosis, lumbar region without neurogenic claudication: M48.061

## 2012-07-16 MED ORDER — ATORVASTATIN CALCIUM 20 MG PO TABS: 20.0000 mg | ORAL_TABLET | Freq: Every day | ORAL | Status: DC

## 2012-07-16 MED ORDER — METOPROLOL SUCCINATE ER 50 MG PO TB24: 50.0000 mg | ORAL_TABLET | Freq: Every day | ORAL | Status: DC

## 2012-07-16 MED ORDER — PANTOPRAZOLE SODIUM 40 MG PO TBEC: 40.0000 mg | DELAYED_RELEASE_TABLET | Freq: Every day | ORAL | Status: DC

## 2012-07-16 NOTE — Assessment & Plan Note (Signed)
Lab Results  Component Value Date   HGBA1C 5.6 04/23/2012   stable overall by history and exam, recent data reviewed with pt, and pt to continue medical treatment as before,  to f/u any worsening symptoms or concerns

## 2012-07-16 NOTE — Assessment & Plan Note (Addendum)
Mild pain, neuro stable exam, stable overall by history and exam, recent data reviewed with pt - MRI 2012, and pt to continue medical treatment as before,  to f/u any worsening symptoms or concerns for tylenol prn

## 2012-07-16 NOTE — Progress Notes (Signed)
Subjective:    Patient ID: Lindsay Doyle, female    DOB: [birth year], 79 y.o.   MRN: [id number]  HPI  Here to f/u; overall doing ok,  Pt denies chest pain, increased sob or doe, wheezing, orthopnea, PND, increased LE swelling, palpitations, dizziness or syncope.  Pt denies polydipsia, polyuria, or low sugar symptoms such as weakness or confusion improved with po intake.  Pt denies new neurological symptoms such as new headache, or facial or extremity weakness or numbness.   Pt states overall good compliance with meds, has been trying to follow lower cholesterol diet, with wt overall stable,  but little exercise however.Pt continues to have recurring left LBP without change in severity, bowel or bladder change, fever, wt loss,  worsening LE pain/numbness/weakness, gait change or falls, has known hx of lumbar stenosis, overall pain mild. Past Medical History  Diagnosis Date  . HYPERCHOLESTEROLEMIA 09/17/2009  . MITRAL VALVE PROLAPSE 03/09/2007    Remotely - last echo 2011 did not show this  . ANEMIA-IRON DEFICIENCY 03/09/2007  . LEUKOPENIA, MILD 05/30/2008  . HYPERTENSION 11/12/2006  . COPD 03/09/2007    ? pt is unsure  . OSTEOARTHRITIS 02/19/2009    Spinal  . OSTEOPOROSIS 11/12/2006  . COLONIC POLYPS, HX OF 03/09/2007  . HYPERGLYCEMIA 07/02/2007  . ASYMPTOMATIC POSTMENOPAUSAL STATUS 07/17/2008  . PSVT     a. Remotely, in setting of anemia.  [name] Positive H. pylori test 10/1996  . Cataract   . Blood transfusion     a. 05/2010: 3 transfusions for anemia/GIB.  [name] GI bleed     a. 05/2010: Hgb 7 at outside hospital s/p 3 u PRBC, EGD 05/2010 mild gastritis, small hiatal hernia, Schatzki ring, negative colonoscopy; capsule endoscopy 06/2010 - possible nonspecific inflammatory changes.  [name] Hx of echocardiogram     a. echo 2/11:  EF 55-60%, mild AI, mild RAE, mild to mod TR  . NSTEMI (non-ST elevated myocardial infarction) 03/2012    a. Type II in setting of AF with RVR 03/2012  . Atrial fibrillation     a.  Apixaban started 03/2012  . CAD (coronary artery disease)     a. LHC 1/14:  mLAD serial 30 and 60%, oRCA 30%, EF 60%  . Hx of echocardiogram     a. Echo 2/14:  Mild focal basal and mild LVH, EF 60-65%, mild AI, mild RAE, trivia effusion   Past Surgical History  Procedure Laterality Date  . Abdominal hysterectomy    . Tonsillectomy    . Tubal ligation    . Cataract extraction    . Cardiac catheterization  1992    S/P in 1992 at [facility] in [place]. This was negative for any coronary artery disease  . Carotid duplex  08/29/2003  . Ceasarean      reports that she has never smoked. She does not have any smokeless tobacco history on file. She reports that she does not drink alcohol or use illicit drugs. family history includes Cancer in her brother and mother and Sarcoidosis in her other and sister.  There is no history of CAD. Allergies  Allergen Reactions  . Azithromycin Other (See Comments)    diarrhea  . Cefuroxime Axetil Other (See Comments)    diarrhea  . Clarithromycin Other (See Comments)    diarrhea  . Hydrocod Polst-Cpm Polst Er Other (See Comments)    Reaction unknown  . Metronidazole Other (See Comments)    Pt had difficulty swallowing this and says  it was "horrible" to take. No allergic reaction.  . Rofecoxib     REACTION: unspecified   Current Outpatient Prescriptions on File Prior to Visit  Medication Sig Dispense Refill  . acetaminophen (TYLENOL) 500 MG tablet Take 1,000 mg by mouth every 6 (six) hours as needed. For pain/fever      . apixaban (ELIQUIS) 2.5 MG TABS tablet Take 1 tablet (2.5 mg total) by mouth 2 (two) times daily.  60 tablet  11  . b complex vitamins tablet Take 1 tablet by mouth 2 (two) times a week.       . Calcium Carbonate-Vitamin D (CALTRATE 600+D) 600-400 MG-UNIT per tablet Take 1 tablet by mouth daily.        . fluticasone (CUTIVATE) 0.05 % cream Use as directed up to 4 times per day as needed for leg rash  30 g  1  .  Iron 66 MG TABS Take 1 tablet by mouth every other day.      . Multiple Vitamin (MULTIVITAMIN) tablet Take 1 tablet by mouth daily.        . nitroGLYCERIN (NITROSTAT) 0.4 MG SL tablet Place 1 tablet (0.4 mg total) under the tongue every 5 (five) minutes x 3 doses as needed for chest pain.  25 tablet  11  . tacrolimus (PROTOPIC) 0.1 % ointment Apply topically 2 (two) times daily. As needed, thin layer  30 g  1   No current facility-administered medications on file prior to visit.   Review of Systems  Constitutional: Negative for unexpected weight change, or unusual diaphoresis  HENT: Negative for tinnitus.   Eyes: Negative for photophobia and visual disturbance.  Respiratory: Negative for choking and stridor.   Gastrointestinal: Negative for vomiting and blood in stool.  Genitourinary: Negative for hematuria and decreased urine volume.  Musculoskeletal: Negative for acute joint swelling Skin: Negative for color change and wound.  Neurological: Negative for tremors and numbness other than noted  Psychiatric/Behavioral: Negative for decreased concentration or  hyperactivity.       Objective:   Physical Exam BP 142/80  Pulse 75  Temp(Src) 96.8 F (36 C) (Oral)  Ht 5\' 2"  (1.575 m)  Wt 110 lb 6 oz (50.066 kg)  BMI 20.18 kg/m2  SpO2 96% VS noted,  Constitutional: Pt appears well-developed and well-nourished.  HENT: Head: NCAT.  Right Ear: External ear normal.  Left Ear: External ear normal.  Eyes: Conjunctivae and EOM are normal. Pupils are equal, round, and reactive to light.  Neck: Normal range of motion. Neck supple.  Cardiovascular: Normal rate and regular rhythm.   Pulmonary/Chest: Effort normal and breath sounds normal.  Abd:  Soft, NT, non-distended, + BS Spine: nontender Neurological: Pt is alert. Not confused , motor/dtr/gait intact Skin: Skin is warm. No erythema.  Psychiatric: Pt behavior is normal. Thought content normal.     Assessment & Plan:

## 2012-07-16 NOTE — Assessment & Plan Note (Signed)
stable overall by history and exam, recent data reviewed with pt, and pt to continue medical treatment as before,  to f/u any worsening symptoms or concerns Lab Results  Component Value Date   LDLCALC 84 06/08/2012    

## 2012-07-16 NOTE — Assessment & Plan Note (Signed)
stable overall by history and exam, recent data reviewed with pt, and pt to continue medical treatment as before,  to f/u any worsening symptoms or concerns BP Readings from Last 3 Encounters:  07/16/12 142/80  06/21/12 152/88  05/26/12 152/90

## 2012-07-16 NOTE — Patient Instructions (Addendum)
Please try the Tylenol 8 hour (or the generic store brand) for pain, and you can also try the Capsiacin cream as well. We could consider a patch called Lidoderm for pain as well, although this can be expensive.  Please continue all other medications as before, and refills have been done if requested. Please keep your appointments with your specialists as you have planned Please continue your efforts at being more active, low cholesterol diet, and weight control. Thank you for enrolling in [facility]. Please follow the instructions below to securely access your online medical record. MyChart allows you to send messages to your doctor, view your test results, renew your prescriptions, schedule appointments, and more. Please return in 6 months, or sooner if needed

## 2012-07-21 ENCOUNTER — Ambulatory Visit: Payer: Medicare Other | Admitting: Internal Medicine

## 2012-09-14 ENCOUNTER — Ambulatory Visit (INDEPENDENT_AMBULATORY_CARE_PROVIDER_SITE_OTHER): Payer: Medicare Other | Admitting: Internal Medicine

## 2012-09-14 ENCOUNTER — Other Ambulatory Visit (INDEPENDENT_AMBULATORY_CARE_PROVIDER_SITE_OTHER): Payer: Medicare Other

## 2012-09-14 ENCOUNTER — Encounter: Payer: Self-pay | Admitting: Internal Medicine

## 2012-09-14 VITALS — BP 104/70 | HR 81 | Temp 97.8°F | Ht 64.0 in | Wt 114.4 lb

## 2012-09-14 DIAGNOSIS — R42 Dizziness and giddiness: Secondary | ICD-10-CM

## 2012-09-14 DIAGNOSIS — R7309 Other abnormal glucose: Secondary | ICD-10-CM

## 2012-09-14 DIAGNOSIS — R252 Cramp and spasm: Secondary | ICD-10-CM

## 2012-09-14 DIAGNOSIS — R5383 Other fatigue: Secondary | ICD-10-CM | POA: Insufficient documentation

## 2012-09-14 DIAGNOSIS — R7302 Impaired glucose tolerance (oral): Secondary | ICD-10-CM

## 2012-09-14 DIAGNOSIS — E785 Hyperlipidemia, unspecified: Secondary | ICD-10-CM

## 2012-09-14 DIAGNOSIS — R5381 Other malaise: Secondary | ICD-10-CM

## 2012-09-14 LAB — URINALYSIS, ROUTINE W REFLEX MICROSCOPIC
Leukocytes, UA: NEGATIVE
Nitrite: NEGATIVE
Specific Gravity, Urine: <=1.005 (ref 1.000–1.030)
Urine Glucose: NEGATIVE
Urobilinogen, UA: 0.2 (ref 0.0–1.0)
WBC, UA: NONE SEEN (ref 0–?)

## 2012-09-14 LAB — CBC WITH DIFFERENTIAL/PLATELET
Basophils Relative: 0.7 % (ref 0.0–3.0)
Eosinophils Absolute: 0.2 10*3/uL (ref 0.0–0.7)
Lymphocytes Relative: 37.9 % (ref 12.0–46.0)
MCHC: 33.9 g/dL (ref 30.0–36.0)
Neutrophils Relative %: 47.2 % (ref 43.0–77.0)
RBC: 2.8 Mil/uL — ABNORMAL LOW (ref 3.87–5.11)
WBC: 6.5 10*3/uL (ref 4.5–10.5)

## 2012-09-14 MED ORDER — METOPROLOL SUCCINATE ER 50 MG PO TB24: ORAL_TABLET | ORAL | Status: DC

## 2012-09-14 NOTE — Patient Instructions (Addendum)
OK to try HALF of the toprol XL for one week to see if helps with the fatigue and dizziness Please continue all other medications as before Please go to the LAB in the Basement (turn left off the elevator) for the tests to be done today You will be contacted by phone if any changes need to be made immediately.  Otherwise, you will receive a letter about your results with an explanation  Please remember to sign up for My Chart if you have not done so, as this will be important to you in the future with finding out test results, communicating by private email, and scheduling acute appointments online when needed.  Please keep your appointments with your specialists as you have planned  Please return in 6 months, or sooner if needed

## 2012-09-15 ENCOUNTER — Telehealth: Payer: Self-pay | Admitting: Internal Medicine

## 2012-09-15 ENCOUNTER — Encounter: Payer: Self-pay | Admitting: Internal Medicine

## 2012-09-15 ENCOUNTER — Other Ambulatory Visit: Payer: Self-pay | Admitting: Internal Medicine

## 2012-09-15 DIAGNOSIS — E538 Deficiency of other specified B group vitamins: Secondary | ICD-10-CM

## 2012-09-15 DIAGNOSIS — D649 Anemia, unspecified: Secondary | ICD-10-CM

## 2012-09-15 LAB — HEPATIC FUNCTION PANEL
AST: 25 U/L (ref 0–37)
Albumin: 3.9 g/dL (ref 3.5–5.2)
Alkaline Phosphatase: 46 U/L (ref 39–117)
Total Bilirubin: 0.7 mg/dL (ref 0.3–1.2)

## 2012-09-15 LAB — BASIC METABOLIC PANEL
Calcium: 9.4 mg/dL (ref 8.4–10.5)
GFR: 69.91 mL/min (ref >60.00)
Potassium: 5.1 mEq/L (ref 3.5–5.1)
Sodium: 139 mEq/L (ref 135–145)

## 2012-09-15 LAB — LIPID PANEL
HDL: 71.7 mg/dL (ref >39.00)
LDL Cholesterol: 68 mg/dL (ref 0–99)
Total CHOL/HDL Ratio: 2
VLDL: 18 mg/dL (ref 0.0–40.0)

## 2012-09-15 NOTE — Assessment & Plan Note (Signed)
stable overall by history and exam, recent data reviewed with pt, and pt to continue medical treatment as before,  to f/u any worsening symptoms or concerns Lab Results  Component Value Date   HGBA1C 5.7 09/14/2012

## 2012-09-15 NOTE — Assessment & Plan Note (Addendum)
Etiology unclear, Etiology unclear, Exam otherwise benign, to check labs as documented, follow with expectant management  Note:  Total time for pt hx, exam, review of record with pt in the room, determination of diagnoses and plan for further eval and tx is > 40 min, with over 50% spent in coordination and counseling of patient

## 2012-09-15 NOTE — Telephone Encounter (Signed)
Called the patient informed of MD instructions.  She agreed to return to the lab tomorrow June 19. 2014.

## 2012-09-15 NOTE — Assessment & Plan Note (Signed)
Etiology unclear, mild at worst, for labs today, consider further eval such as carotids

## 2012-09-15 NOTE — Telephone Encounter (Signed)
The test results show that your current treatment is OK, except for the surprising finding of anemia, as you yourself were wondering at your last visit. The cause of this is not clear, you have a history of iron deficiency and have seen GI previously with negative evaluation at one point. For now, we will ask for further testing to see if we can further determine a possible cause that way, such as low iron. If this is not helpful, hematology or GI referral may be needed. There is no other need for change of treatment or further evaluation based on these results which are normal or stable. Please continue the same plan for further evaluation and treatment as discussed at your office visit.        Robin to call pt

## 2012-09-15 NOTE — Progress Notes (Signed)
Subjective:    Patient ID: Lindsay Doyle, female    DOB: 1933/07/22, 79 y.o.   MRN: [id number]  HPI    Here with c/o unusual gradual onset general weakness with fatigue, recurring mild dizziness but no syncope.  Pt denies chest pain, increased sob or doe, wheezing, orthopnea, PND, increased LE swelling, palpitations.  No overt bleeding or bruising.  Has had several lower blood pressures recently. No fever, joint pain, rash.  Denies worsening reflux, abd pain, dysphagia, n/v, bowel change or blood.  Has had several left foot leg cramps at night in the past wk, no clear claudiation type pain.   Pt denies fever, wt loss, night sweats, loss of appetite, or other constitutional symptoms Pt denies new neurological symptoms such as new headache, or facial or extremity weakness or numbness   Pt denies polydipsia, polyuria,  Past Medical History  Diagnosis Date  . HYPERCHOLESTEROLEMIA 09/17/2009  . MITRAL VALVE PROLAPSE 03/09/2007    Remotely - last echo 2011 did not show this  . ANEMIA-IRON DEFICIENCY 03/09/2007  . LEUKOPENIA, MILD 05/30/2008  . HYPERTENSION 11/12/2006  . COPD 03/09/2007    ? pt is unsure  . OSTEOARTHRITIS 02/19/2009    Spinal  . OSTEOPOROSIS 11/12/2006  . COLONIC POLYPS, HX OF 03/09/2007  . HYPERGLYCEMIA 07/02/2007  . ASYMPTOMATIC POSTMENOPAUSAL STATUS 07/17/2008  . PSVT     a. Remotely, in setting of anemia.  [name] Positive H. pylori test 10/1996  . Cataract   . Blood transfusion     a. 05/2010: 3 transfusions for anemia/GIB.  [name] GI bleed     a. 05/2010: Hgb 7 at outside hospital s/p 3 u PRBC, EGD 05/2010 mild gastritis, small hiatal hernia, Schatzki ring, negative colonoscopy; capsule endoscopy 06/2010 - possible nonspecific inflammatory changes.  [name] Hx of echocardiogram     a. echo 2/11:  EF 55-60%, mild AI, mild RAE, mild to mod TR  . NSTEMI (non-ST elevated myocardial infarction) 03/2012    a. Type II in setting of AF with RVR 03/2012  . Atrial fibrillation     a. Apixaban started 03/2012   . CAD (coronary artery disease)     a. LHC 1/14:  mLAD serial 30 and 60%, oRCA 30%, EF 60%  . Hx of echocardiogram     a. Echo 2/14:  Mild focal basal and mild LVH, EF 60-65%, mild AI, mild RAE, trivia effusion  . Spinal stenosis of lumbar region 07/16/2012   Past Surgical History  Procedure Laterality Date  . Abdominal hysterectomy    . Tonsillectomy    . Tubal ligation    . Cataract extraction    . Cardiac catheterization  1992    S/P in 1992 at [facility] in [place]. This was negative for any coronary artery disease  . Carotid duplex  08/29/2003  . Ceasarean      reports that she has never smoked. She does not have any smokeless tobacco history on file. She reports that she does not drink alcohol or use illicit drugs. family history includes Cancer in her brother and mother and Sarcoidosis in her other and sister.  There is no history of CAD. Allergies  Allergen Reactions  . Azithromycin Other (See Comments)    diarrhea  . Cefuroxime Axetil Other (See Comments)    diarrhea  . Clarithromycin Other (See Comments)    diarrhea  . Hydrocod Polst-Cpm Polst Er Other (See Comments)    Reaction unknown  . Metronidazole Other (See  Comments)    Pt had difficulty swallowing this and says it was "horrible" to take. No allergic reaction.  . Rofecoxib     REACTION: unspecified   Current Outpatient Prescriptions on File Prior to Visit  Medication Sig Dispense Refill  . acetaminophen (TYLENOL) 500 MG tablet Take 1,000 mg by mouth every 6 (six) hours as needed. For pain/fever      . apixaban (ELIQUIS) 2.5 MG TABS tablet Take 1 tablet (2.5 mg total) by mouth 2 (two) times daily.  60 tablet  11  . atorvastatin (LIPITOR) 20 MG tablet Take 1 tablet (20 mg total) by mouth daily at 6 PM.  90 tablet  3  . b complex vitamins tablet Take 1 tablet by mouth 2 (two) times a week.       . Calcium Carbonate-Vitamin D (CALTRATE 600+D) 600-400 MG-UNIT per tablet Take 1 tablet by  mouth daily.        . fluticasone (CUTIVATE) 0.05 % cream Use as directed up to 4 times per day as needed for leg rash  30 g  1  . Iron 66 MG TABS Take 1 tablet by mouth every other day.      . Multiple Vitamin (MULTIVITAMIN) tablet Take 1 tablet by mouth daily.        . nitroGLYCERIN (NITROSTAT) 0.4 MG SL tablet Place 1 tablet (0.4 mg total) under the tongue every 5 (five) minutes x 3 doses as needed for chest pain.  25 tablet  11  . pantoprazole (PROTONIX) 40 MG tablet Take 1 tablet (40 mg total) by mouth daily.  90 tablet  3  . tacrolimus (PROTOPIC) 0.1 % ointment Apply topically 2 (two) times daily. As needed, thin layer  30 g  1   No current facility-administered medications on file prior to visit.   Review of Systems  Constitutional: Negative for unexpected weight change, or unusual diaphoresis  HENT: Negative for tinnitus.   Eyes: Negative for photophobia and visual disturbance.  Respiratory: Negative for choking and stridor.   Gastrointestinal: Negative for vomiting and blood in stool.  Genitourinary: Negative for hematuria and decreased urine volume.  Musculoskeletal: Negative for acute joint swelling Skin: Negative for color change and wound.  Neurological: Negative for tremors and numbness other than noted  Psychiatric/Behavioral: Negative for decreased concentration or  hyperactivity.       Objective:   Physical Exam BP 104/70  Pulse 81  Temp(Src) 97.8 F (36.6 C) (Oral)  Ht 5\' 4"  (1.626 m)  Wt 114 lb 6 oz (51.88 kg)  BMI 19.62 kg/m2  SpO2 97% VS noted,  Constitutional: Pt appears thin, fatigued but able to stand, walk, get up on exam table fairly well without assist.  HENT: Head: NCAT.  Right Ear: External ear normal.  Left Ear: External ear normal.  Eyes: Conjunctivae and EOM are normal. Pupils are equal, round, and reactive to light.  Neck: Normal range of motion. Neck supple.  Cardiovascular: Normal rate and regular rhythm.   Pulmonary/Chest: Effort normal and  breath sounds normal.  Abd:  Soft, NT, non-distended, + BS Neurological: Pt is alert. Not confused  Skin: Skin is warm. No erythema.  Psychiatric: Pt behavior is normal. Thought content normal.     Assessment & Plan:

## 2012-09-15 NOTE — Assessment & Plan Note (Signed)
Also for mg check, and K, calcium,  to f/u any worsening symptoms or concerns

## 2012-09-15 NOTE — Assessment & Plan Note (Signed)
stable overall by history and exam, recent data reviewed with pt, and pt to continue medical treatment as before,  to f/u any worsening symptoms or concerns Lab Results  Component Value Date   LDLCALC 68 09/14/2012

## 2012-09-16 ENCOUNTER — Telehealth: Payer: Self-pay

## 2012-09-16 ENCOUNTER — Ambulatory Visit (INDEPENDENT_AMBULATORY_CARE_PROVIDER_SITE_OTHER): Payer: Medicare Other

## 2012-09-16 DIAGNOSIS — D649 Anemia, unspecified: Secondary | ICD-10-CM

## 2012-09-16 DIAGNOSIS — D589 Hereditary hemolytic anemia, unspecified: Secondary | ICD-10-CM

## 2012-09-16 LAB — CBC WITH DIFFERENTIAL/PLATELET
Basophils Absolute: 0 10*3/uL (ref 0.0–0.1)
Eosinophils Absolute: 0.1 10*3/uL (ref 0.0–0.7)
HCT: 24.9 % — ABNORMAL LOW (ref 36.0–46.0)
Lymphs Abs: 1.7 10*3/uL (ref 0.7–4.0)
MCHC: 34.3 g/dL (ref 30.0–36.0)
MCV: 101.3 fl — ABNORMAL HIGH (ref 78.0–100.0)
Monocytes Absolute: 0.5 10*3/uL (ref 0.1–1.0)
Neutro Abs: 2.4 10*3/uL (ref 1.4–7.7)
Platelets: 175 10*3/uL (ref 150.0–400.0)
RDW: 13.3 % (ref 11.5–14.6)

## 2012-09-16 LAB — IBC PANEL
Iron: 76 ug/dL (ref 42–145)
Saturation Ratios: 22.1 % (ref 20.0–50.0)

## 2012-09-16 LAB — HAPTOGLOBIN: Haptoglobin: 33 mg/dL — ABNORMAL LOW (ref 45–215)

## 2012-09-16 LAB — LACTATE DEHYDROGENASE: LDH: 208 U/L (ref 94–250)

## 2012-09-16 LAB — RETICULOCYTES: Retic Ct Pct: 3.85 % — ABNORMAL HIGH (ref 0.4–2.3)

## 2012-09-16 NOTE — Telephone Encounter (Signed)
Labs entered.

## 2012-09-16 NOTE — Telephone Encounter (Signed)
Spoke to Dr Magrinat/hematology/oncology who advised pt with possible hemolytic anemia, needs referral  I then spoke to pt who c/o only general weakness  Will need to d/w [name] next per Dr Jana Hakim who is doing hematology now for the group

## 2012-09-16 NOTE — Telephone Encounter (Signed)
Lindsay Doyle to contact pt, referral done urgent, but have been unable to speak to [name] so far  We will keep trying, but she should go to ER for any worsening sob/doe/CP, dizziness or falls

## 2012-09-16 NOTE — Telephone Encounter (Signed)
Critical lab Haptoglobin 33

## 2012-09-16 NOTE — Telephone Encounter (Signed)
I have been unable to contact Dr Darnell Level by paging, and left message with Amy (nurse)  Will go ahead and refer urgent and cont to try contacting

## 2012-09-16 NOTE — Telephone Encounter (Signed)
Called informed the patient of MD instructions.

## 2012-09-17 ENCOUNTER — Inpatient Hospital Stay (HOSPITAL_COMMUNITY)
Admission: EM | Admit: 2012-09-17 | Discharge: 2012-09-20 | DRG: 810 | Disposition: A | Discharge status: Home / Self Care | Payer: Medicare Other | Attending: Internal Medicine | Admitting: Internal Medicine

## 2012-09-17 ENCOUNTER — Telehealth: Payer: Self-pay | Admitting: Oncology

## 2012-09-17 ENCOUNTER — Encounter (HOSPITAL_COMMUNITY): Payer: Self-pay | Admitting: *Deleted

## 2012-09-17 ENCOUNTER — Ambulatory Visit: Payer: Medicare Other

## 2012-09-17 ENCOUNTER — Telehealth: Payer: Self-pay

## 2012-09-17 DIAGNOSIS — J45909 Unspecified asthma, uncomplicated: Secondary | ICD-10-CM | POA: Diagnosis not present

## 2012-09-17 DIAGNOSIS — M810 Age-related osteoporosis without current pathological fracture: Secondary | ICD-10-CM | POA: Diagnosis not present

## 2012-09-17 DIAGNOSIS — R197 Diarrhea, unspecified: Secondary | ICD-10-CM

## 2012-09-17 DIAGNOSIS — R5381 Other malaise: Secondary | ICD-10-CM | POA: Diagnosis not present

## 2012-09-17 DIAGNOSIS — R21 Rash and other nonspecific skin eruption: Secondary | ICD-10-CM

## 2012-09-17 DIAGNOSIS — Z79899 Other long term (current) drug therapy: Secondary | ICD-10-CM | POA: Diagnosis not present

## 2012-09-17 DIAGNOSIS — I4891 Unspecified atrial fibrillation: Secondary | ICD-10-CM | POA: Diagnosis present

## 2012-09-17 DIAGNOSIS — I10 Essential (primary) hypertension: Secondary | ICD-10-CM | POA: Diagnosis present

## 2012-09-17 DIAGNOSIS — Z0000 Encounter for general adult medical examination without abnormal findings: Secondary | ICD-10-CM

## 2012-09-17 DIAGNOSIS — J449 Chronic obstructive pulmonary disease, unspecified: Secondary | ICD-10-CM | POA: Diagnosis present

## 2012-09-17 DIAGNOSIS — M1990 Unspecified osteoarthritis, unspecified site: Secondary | ICD-10-CM | POA: Diagnosis present

## 2012-09-17 DIAGNOSIS — D589 Hereditary hemolytic anemia, unspecified: Secondary | ICD-10-CM

## 2012-09-17 DIAGNOSIS — D649 Anemia, unspecified: Secondary | ICD-10-CM | POA: Diagnosis not present

## 2012-09-17 DIAGNOSIS — D509 Iron deficiency anemia, unspecified: Secondary | ICD-10-CM

## 2012-09-17 DIAGNOSIS — M48061 Spinal stenosis, lumbar region without neurogenic claudication: Secondary | ICD-10-CM | POA: Diagnosis present

## 2012-09-17 DIAGNOSIS — E7800 Pure hypercholesterolemia, unspecified: Secondary | ICD-10-CM | POA: Diagnosis present

## 2012-09-17 DIAGNOSIS — J4489 Other specified chronic obstructive pulmonary disease: Secondary | ICD-10-CM | POA: Diagnosis not present

## 2012-09-17 DIAGNOSIS — R252 Cramp and spasm: Secondary | ICD-10-CM

## 2012-09-17 DIAGNOSIS — D599 Acquired hemolytic anemia, unspecified: Principal | ICD-10-CM | POA: Diagnosis present

## 2012-09-17 DIAGNOSIS — I059 Rheumatic mitral valve disease, unspecified: Secondary | ICD-10-CM

## 2012-09-17 DIAGNOSIS — I252 Old myocardial infarction: Secondary | ICD-10-CM

## 2012-09-17 DIAGNOSIS — M25552 Pain in left hip: Secondary | ICD-10-CM

## 2012-09-17 DIAGNOSIS — Z780 Asymptomatic menopausal state: Secondary | ICD-10-CM

## 2012-09-17 DIAGNOSIS — I2510 Atherosclerotic heart disease of native coronary artery without angina pectoris: Secondary | ICD-10-CM | POA: Diagnosis not present

## 2012-09-17 DIAGNOSIS — R42 Dizziness and giddiness: Secondary | ICD-10-CM

## 2012-09-17 DIAGNOSIS — Z86010 Personal history of colonic polyps: Secondary | ICD-10-CM

## 2012-09-17 DIAGNOSIS — E785 Hyperlipidemia, unspecified: Secondary | ICD-10-CM

## 2012-09-17 DIAGNOSIS — I214 Non-ST elevation (NSTEMI) myocardial infarction: Secondary | ICD-10-CM | POA: Diagnosis not present

## 2012-09-17 DIAGNOSIS — D72819 Decreased white blood cell count, unspecified: Secondary | ICD-10-CM

## 2012-09-17 DIAGNOSIS — N951 Menopausal and female climacteric states: Secondary | ICD-10-CM

## 2012-09-17 DIAGNOSIS — K222 Esophageal obstruction: Secondary | ICD-10-CM

## 2012-09-17 DIAGNOSIS — K769 Liver disease, unspecified: Secondary | ICD-10-CM

## 2012-09-17 DIAGNOSIS — R05 Cough: Secondary | ICD-10-CM

## 2012-09-17 DIAGNOSIS — J309 Allergic rhinitis, unspecified: Secondary | ICD-10-CM

## 2012-09-17 DIAGNOSIS — R7302 Impaired glucose tolerance (oral): Secondary | ICD-10-CM

## 2012-09-17 DIAGNOSIS — I471 Supraventricular tachycardia: Secondary | ICD-10-CM

## 2012-09-17 LAB — CBC WITH DIFFERENTIAL/PLATELET
Basophils Absolute: 0 10*3/uL (ref 0.0–0.1)
Eosinophils Absolute: 0.1 10*3/uL (ref 0.0–0.7)
Eosinophils Relative: 2 % (ref 0–5)
Eosinophils Relative: 1 % (ref 0–5)
HCT: 21.8 % — ABNORMAL LOW (ref 36.0–46.0)
Hemoglobin: 7.4 g/dL — ABNORMAL LOW (ref 12.0–15.0)
Hemoglobin: 8.2 g/dL — ABNORMAL LOW (ref 12.0–15.0)
Lymphocytes Relative: 29 % (ref 12–46)
Lymphocytes Relative: 27 % (ref 12–46)
Lymphs Abs: 2.1 10*3/uL (ref 0.7–4.0)
MCH: 34.3 pg — ABNORMAL HIGH (ref 26.0–34.0)
MCV: 98.6 fL (ref 78.0–100.0)
MCV: 97.9 fL (ref 78.0–100.0)
Monocytes Absolute: 0.5 10*3/uL (ref 0.1–1.0)
Monocytes Relative: 9 % (ref 3–12)
Monocytes Relative: 10 % (ref 3–12)
Neutro Abs: 3.3 10*3/uL (ref 1.7–7.7)
Neutrophils Relative %: 62 % (ref 43–77)
RBC: 2.39 MIL/uL — ABNORMAL LOW (ref 3.87–5.11)
RDW: 14.4 % (ref 11.5–15.5)
WBC: 5.5 10*3/uL (ref 4.0–10.5)
WBC: 7.9 10*3/uL (ref 4.0–10.5)

## 2012-09-17 LAB — COMPREHENSIVE METABOLIC PANEL
ALT: 16 U/L (ref 0–35)
Alkaline Phosphatase: 62 U/L (ref 39–117)
BUN: 14 mg/dL (ref 6–23)
CO2: 28 mEq/L (ref 19–32)
Chloride: 104 mEq/L (ref 96–112)
GFR calc Af Amer: 65 mL/min — ABNORMAL LOW (ref >90)
GFR calc non Af Amer: 56 mL/min — ABNORMAL LOW (ref >90)
Glucose, Bld: 110 mg/dL — ABNORMAL HIGH (ref 70–99)
Potassium: 4.1 mEq/L (ref 3.5–5.1)
Sodium: 143 mEq/L (ref 135–145)
Total Bilirubin: 0.3 mg/dL (ref 0.3–1.2)

## 2012-09-17 LAB — DIRECT ANTIGLOBULIN TEST (NOT AT ARMC)
DAT IgG: NEGATIVE
DAT, IgG: NEGATIVE
DAT, complement: NEGATIVE

## 2012-09-17 LAB — PROTIME-INR
INR: 1 (ref 0.00–1.49)
Prothrombin Time: 13.1 seconds (ref 11.6–15.2)

## 2012-09-17 LAB — PREPARE RBC (CROSSMATCH)

## 2012-09-17 MED ORDER — SODIUM CHLORIDE 0.9 % IV SOLN
INTRAVENOUS | Status: DC
  Administered 2012-09-17: 22:00:00 via INTRAVENOUS

## 2012-09-17 MED ORDER — METOPROLOL SUCCINATE ER 25 MG PO TB24
25.0000 mg | ORAL_TABLET | Freq: Every day | ORAL | Status: DC
  Administered 2012-09-18 – 2012-09-20 (×3): 25 mg via ORAL
  Filled 2012-09-17 (×4): qty 1

## 2012-09-17 MED ORDER — ONDANSETRON HCL 4 MG/2ML IJ SOLN: 4.0000 mg | Freq: Four times a day (QID) | INTRAMUSCULAR | Status: DC | PRN

## 2012-09-17 MED ORDER — PANTOPRAZOLE SODIUM 40 MG IV SOLR: 40.0000 mg | Freq: Two times a day (BID) | INTRAVENOUS | Status: DC

## 2012-09-17 MED ORDER — SODIUM CHLORIDE 0.9 % IV SOLN
8.0000 mg/h | INTRAVENOUS | Status: DC
  Administered 2012-09-17 – 2012-09-19 (×4): 8 mg/h via INTRAVENOUS
  Filled 2012-09-17 (×13): qty 80

## 2012-09-17 MED ORDER — SODIUM CHLORIDE 0.9 % IV SOLN
80.0000 mg | Freq: Once | INTRAVENOUS | Status: AC
  Administered 2012-09-17: 80 mg via INTRAVENOUS
  Filled 2012-09-17: qty 80

## 2012-09-17 MED ORDER — ACETAMINOPHEN 650 MG RE SUPP: 650.0000 mg | Freq: Four times a day (QID) | RECTAL | Status: DC | PRN

## 2012-09-17 MED ORDER — SODIUM CHLORIDE 0.9 % IV SOLN
INTRAVENOUS | Status: DC
  Administered 2012-09-17: 19:00:00 via INTRAVENOUS

## 2012-09-17 MED ORDER — FLUTICASONE PROPIONATE 0.05 % EX CREA: TOPICAL_CREAM | Freq: Four times a day (QID) | CUTANEOUS | Status: DC | PRN

## 2012-09-17 MED ORDER — SODIUM CHLORIDE 0.9 % IJ SOLN
3.0000 mL | Freq: Two times a day (BID) | INTRAMUSCULAR | Status: DC
  Administered 2012-09-17 – 2012-09-18 (×3): 3 mL via INTRAVENOUS

## 2012-09-17 MED ORDER — NITROGLYCERIN 0.4 MG SL SUBL: 0.4000 mg | SUBLINGUAL_TABLET | SUBLINGUAL | Status: DC | PRN

## 2012-09-17 MED ORDER — ACETAMINOPHEN 325 MG PO TABS: 650.0000 mg | ORAL_TABLET | Freq: Four times a day (QID) | ORAL | Status: DC | PRN

## 2012-09-17 MED ORDER — ONDANSETRON HCL 4 MG PO TABS: 4.0000 mg | ORAL_TABLET | Freq: Four times a day (QID) | ORAL | Status: DC | PRN

## 2012-09-17 MED ORDER — TACROLIMUS 0.1 % EX OINT: TOPICAL_OINTMENT | Freq: Two times a day (BID) | CUTANEOUS | Status: DC

## 2012-09-17 NOTE — ED Notes (Signed)
IV team informed of need for IV

## 2012-09-17 NOTE — ED Notes (Signed)
Family at bedside. 

## 2012-09-17 NOTE — Telephone Encounter (Signed)
Labs ordered as stat  Please have pt come for lab now, and be prepared to be referred to ER for further eval and tx (likely admit) for symptomatic anemia if Hgb is close or less to 7

## 2012-09-17 NOTE — Telephone Encounter (Signed)
Called the patient and she has no hematology appt. Scheduled yet.  States she is feeling very tired today per MD request to inquire how she was feeling today.  States she has not fallen and husband is with her today.  She agreed to come back to our office to the lab today asap.

## 2012-09-17 NOTE — H&P (Addendum)
[facility] History and Physical  Lindsay Doyle [id number] DOB: 11-23-1933 DOA: 09/17/2012  Referring physician: ER physician. PCP: Cathlean Cower, MD   Chief Complaint: Weakness and fatigue.  HPI: Lindsay Doyle is a 79 y.o. female with known history of atrial fibrillation on apixaban started experiencing weakness over the last one week with fatigue. Denies any chest pain shortness of breath nausea vomiting abdominal pain. Patient has been having off-and-on dark stools. Patient had gone to her PCP recently and hemoglobin was found to be around 8 marked drop from recent of 13. Patient's haptoglobin was found to be low and was referred to hematologist for hemolytic anemia management. Since patient's weakness got more worse patient decided to come to the ER. Repeat hemoglobin done shows was around 8 and at this time patient has been admitted for further management. Stool for occult blood has been negative. Patient has had previous GI bleeding 2012 where in coloscopy was negative and EGD showed mild gastritis. Patient also had capsule endoscopy. As per patient the cause for GI bleed was not known. Patient denies any recent use of any new medications or antibiotics. Denies any fever chills or any antibiotics.  Review of Systems: As presented in the history of presenting illness, rest negative.  Past Medical History  Diagnosis Date  . HYPERCHOLESTEROLEMIA 09/17/2009  . MITRAL VALVE PROLAPSE 03/09/2007    Remotely - last echo 2011 did not show this  . ANEMIA-IRON DEFICIENCY 03/09/2007  . LEUKOPENIA, MILD 05/30/2008  . HYPERTENSION 11/12/2006  . COPD 03/09/2007    ? pt is unsure  . OSTEOARTHRITIS 02/19/2009    Spinal  . OSTEOPOROSIS 11/12/2006  . COLONIC POLYPS, HX OF 03/09/2007  . HYPERGLYCEMIA 07/02/2007  . ASYMPTOMATIC POSTMENOPAUSAL STATUS 07/17/2008  . PSVT     a. Remotely, in setting of anemia.  [name] Positive H. pylori test 10/1996  . Cataract   . Blood transfusion     a. 05/2010: 3  transfusions for anemia/GIB.  [name] GI bleed     a. 05/2010: Hgb 7 at outside hospital s/p 3 u PRBC, EGD 05/2010 mild gastritis, small hiatal hernia, Schatzki ring, negative colonoscopy; capsule endoscopy 06/2010 - possible nonspecific inflammatory changes.  [name] Hx of echocardiogram     a. echo 2/11:  EF 55-60%, mild AI, mild RAE, mild to mod TR  . NSTEMI (non-ST elevated myocardial infarction) 03/2012    a. Type II in setting of AF with RVR 03/2012  . Atrial fibrillation     a. Apixaban started 03/2012  . CAD (coronary artery disease)     a. LHC 1/14:  mLAD serial 30 and 60%, oRCA 30%, EF 60%  . Hx of echocardiogram     a. Echo 2/14:  Mild focal basal and mild LVH, EF 60-65%, mild AI, mild RAE, trivia effusion  . Spinal stenosis of lumbar region 07/16/2012   Past Surgical History  Procedure Laterality Date  . Abdominal hysterectomy    . Tonsillectomy    . Tubal ligation    . Cataract extraction    . Cardiac catheterization  1992    S/P in 1992 at [facility] in [place]. This was negative for any coronary artery disease  . Carotid duplex  08/29/2003  . Ceasarean     Social History:  reports that she has never smoked. She does not have any smokeless tobacco history on file. She reports that she does not drink alcohol or use illicit drugs. Home. where does patient live--  Can do ADLs. Can patient participate in ADLs?  Allergies  Allergen Reactions  . Azithromycin Other (See Comments)    diarrhea  . Cefuroxime Axetil Other (See Comments)    diarrhea  . Clarithromycin Other (See Comments)    diarrhea  . Hydrocod Polst-Cpm Polst Er Other (See Comments)    Reaction unknown  . Metronidazole Other (See Comments)    Pt had difficulty swallowing this and says it was "horrible" to take. No allergic reaction.  . Rofecoxib     REACTION: unspecified    Family History  Problem Relation Age of Onset  . Cancer Mother     stomach cancer  . Sarcoidosis Sister   . Cancer  Brother     colon cancer  . Sarcoidosis Other   . CAD Neg Hx       Prior to Admission medications   Medication Sig Start Date End Date Taking? Authorizing Provider  acetaminophen (TYLENOL) 500 MG tablet Take 1,000 mg by mouth every 6 (six) hours as needed. For pain/fever   Yes Historical Provider, MD  apixaban (ELIQUIS) 2.5 MG TABS tablet Take 1 tablet (2.5 mg total) by mouth 2 (two) times daily. 04/27/12  Yes Larey Dresser, MD  atorvastatin (LIPITOR) 20 MG tablet Take 1 tablet (20 mg total) by mouth daily at 6 PM. 07/16/12  Yes Biagio Borg, MD  b complex vitamins tablet Take 1 tablet by mouth 2 (two) times a week.    Yes Historical Provider, MD  Calcium Carbonate-Vitamin D (CALTRATE 600+D) 600-400 MG-UNIT per tablet Take 1 tablet by mouth daily.     Yes Historical Provider, MD  fluticasone (CUTIVATE) 0.05 % cream Use as directed up to 4 times per day as needed for leg rash 04/27/12  Yes Biagio Borg, MD  Iron 66 MG TABS Take 1 tablet by mouth every other day.   Yes Historical Provider, MD  metoprolol succinate (TOPROL-XL) 50 MG 24 hr tablet Take 25 mg by mouth daily. Take with or immediately following a meal.   Yes Historical Provider, MD  Multiple Vitamin (MULTIVITAMIN) tablet Take 1 tablet by mouth daily.     Yes Historical Provider, MD  nitroGLYCERIN (NITROSTAT) 0.4 MG SL tablet Place 1 tablet (0.4 mg total) under the tongue every 5 (five) minutes x 3 doses as needed for chest pain. 04/24/12  Yes Scott T Kathlen Mody, PA-C  pantoprazole (PROTONIX) 40 MG tablet Take 1 tablet (40 mg total) by mouth daily. 07/16/12  Yes Biagio Borg, MD  tacrolimus (PROTOPIC) 0.1 % ointment Apply topically 2 (two) times daily. As needed, thin layer 04/27/12  Yes Biagio Borg, MD   Physical Exam: Filed Vitals:   09/17/12 1815 09/17/12 1830 09/17/12 1900 09/17/12 2000  BP: 145/78 157/76 136/70 138/75  Pulse: 84 95 89 92  Temp:      TempSrc:      Resp: 15 20 21 16   SpO2: 100% 96% 97% 98%     General:   Well-developed and nourished.  Eyes: Pallor present. No icterus.  ENT: No discharge from ears eyes nose mouth.  Neck: No mass felt.  Cardiovascular: S1-S2 heard.  Respiratory: No rhonchi or crepitations.  Abdomen: Soft nontender bowel sounds present.  Skin: No rash.  Musculoskeletal: No edema.  Psychiatric: Appears normal.  Neurologic: Alert and oriented to time place and person. Moves all extremities.  Labs on Admission:  Basic Metabolic Panel:  Recent Labs Lab 09/14/12 1656 09/17/12 1708  NA 139 143  K  5.1 4.1  CL 104 104  CO2 28 28  GLUCOSE 92 110*  BUN 22 14  CREATININE 1.0 0.95  CALCIUM 9.4 9.7  MG 2.1  --    Liver Function Tests:  Recent Labs Lab 09/14/12 1656 09/17/12 1708  AST 25 22  ALT 21 16  ALKPHOS 46 62  BILITOT 0.7 0.3  PROT 6.5 7.1  ALBUMIN 3.9 4.2   No results found for this basename: LIPASE, AMYLASE,  in the last 168 hours No results found for this basename: AMMONIA,  in the last 168 hours CBC:  Recent Labs Lab 09/14/12 1656 09/16/12 0832 09/17/12 1153 09/17/12 1708  WBC 6.5 4.7 5.5 7.9  NEUTROABS 3.1 2.4 3.3 4.9  HGB 9.6* 8.5 Repeated and verified X2.* 7.4* 8.2*  HCT 28.3* 24.9 Repeated and verified X2.* 21.8* 23.4*  MCV 101.0* 101.3* 98.6 97.9  PLT 176.0 175.0 181 189   ECG NSR rate 74 LVH     Assessment/Plan Principal Problem:   Anemia Active Problems:   A-fib   1. Symptomatic severe anemia - at this time differentials include hemolytic anemia versus GI source. I did discuss with on-call hematologist Dr. Ralene Ok who has advised to get repeat haptoglobin level LDH and direct Coombs test and stool for occult blood. Dr. Ralene Ok has advised to go ahead with transfusion. Based on the tests ordered to consult him back again. 2. Atrial fibrillation presently rate controlled - we'll hold off apixaban for now. Continue metoprolol. 3. Nonobstructive CAD - denies any chest pain.    Code Status: Full code.  Family  Communication: Patient's husband at the bedside.  Disposition Plan: Admit to inpatient.    KAKRAKANDY,ARSHAD N. [facility] Pager [phone].  If 7PM-7AM, please contact night-coverage [email or web address] Password [email or web address] 09/17/2012, 9:15 PM

## 2012-09-17 NOTE — ED Provider Notes (Signed)
History     CSN: [id number]  Arrival date & time 09/17/12  1640   First MD Initiated Contact with Patient 09/17/12 1702      Chief Complaint  Patient presents with  . Fatigue    (Consider location/radiation/quality/duration/timing/severity/associated sxs/prior treatment) The history is provided by the patient, the spouse and medical records.    pt here with weakness x 4 days that's been gradually worse--saw her pcp 2 days ago for same and according to the office records, pt had hb of 7.4--has chronically black stools, denies hematemesis or abd pain--weakness is worse with ambulation and better with rest--her pcp is concerned about possible hemolytic anemia--h/o GI bleed from gastritis in the past requiring blood transfusion--is schedule to see a hematologist in the near future Past Medical History  Diagnosis Date  . HYPERCHOLESTEROLEMIA 09/17/2009  . MITRAL VALVE PROLAPSE 03/09/2007    Remotely - last echo 2011 did not show this  . ANEMIA-IRON DEFICIENCY 03/09/2007  . LEUKOPENIA, MILD 05/30/2008  . HYPERTENSION 11/12/2006  . COPD 03/09/2007    ? pt is unsure  . OSTEOARTHRITIS 02/19/2009    Spinal  . OSTEOPOROSIS 11/12/2006  . COLONIC POLYPS, HX OF 03/09/2007  . HYPERGLYCEMIA 07/02/2007  . ASYMPTOMATIC POSTMENOPAUSAL STATUS 07/17/2008  . PSVT     a. Remotely, in setting of anemia.  [name] Positive H. pylori test 10/1996  . Cataract   . Blood transfusion     a. 05/2010: 3 transfusions for anemia/GIB.  [name] GI bleed     a. 05/2010: Hgb 7 at outside hospital s/p 3 u PRBC, EGD 05/2010 mild gastritis, small hiatal hernia, Schatzki ring, negative colonoscopy; capsule endoscopy 06/2010 - possible nonspecific inflammatory changes.  [name] Hx of echocardiogram     a. echo 2/11:  EF 55-60%, mild AI, mild RAE, mild to mod TR  . NSTEMI (non-ST elevated myocardial infarction) 03/2012    a. Type II in setting of AF with RVR 03/2012  . Atrial fibrillation     a. Apixaban started 03/2012  . CAD (coronary artery  disease)     a. LHC 1/14:  mLAD serial 30 and 60%, oRCA 30%, EF 60%  . Hx of echocardiogram     a. Echo 2/14:  Mild focal basal and mild LVH, EF 60-65%, mild AI, mild RAE, trivia effusion  . Spinal stenosis of lumbar region 07/16/2012    Past Surgical History  Procedure Laterality Date  . Abdominal hysterectomy    . Tonsillectomy    . Tubal ligation    . Cataract extraction    . Cardiac catheterization  1992    S/P in 1992 at [facility] in [place]. This was negative for any coronary artery disease  . Carotid duplex  08/29/2003  . Ceasarean      Family History  Problem Relation Age of Onset  . Cancer Mother     stomach cancer  . Sarcoidosis Sister   . Cancer Brother     colon cancer  . Sarcoidosis Other   . CAD Neg Hx     History  Substance Use Topics  . Smoking status: Never Smoker   . Smokeless tobacco: Not on file  . Alcohol Use: No    OB History   Grav Para Term Preterm Abortions TAB SAB Ect Mult Living                  Review of Systems  All other systems reviewed and are negative.  Allergies  Azithromycin; Cefuroxime axetil; Clarithromycin; Hydrocod polst-cpm polst er; Metronidazole; and Rofecoxib  Home Medications   Current Outpatient Rx  Name  Route  Sig  Dispense  Refill  . acetaminophen (TYLENOL) 500 MG tablet   Oral   Take 1,000 mg by mouth every 6 (six) hours as needed. For pain/fever         . apixaban (ELIQUIS) 2.5 MG TABS tablet   Oral   Take 1 tablet (2.5 mg total) by mouth 2 (two) times daily.   60 tablet   11   . atorvastatin (LIPITOR) 20 MG tablet   Oral   Take 1 tablet (20 mg total) by mouth daily at 6 PM.   90 tablet   3   . b complex vitamins tablet   Oral   Take 1 tablet by mouth 2 (two) times a week.          . Calcium Carbonate-Vitamin D (CALTRATE 600+D) 600-400 MG-UNIT per tablet   Oral   Take 1 tablet by mouth daily.           . fluticasone (CUTIVATE) 0.05 % cream      Use as  directed up to 4 times per day as needed for leg rash   30 g   1   . Iron 66 MG TABS   Oral   Take 1 tablet by mouth every other day.         . metoprolol succinate (TOPROL-XL) 50 MG 24 hr tablet      1/2 tab by mouth per day   45 tablet   3   . Multiple Vitamin (MULTIVITAMIN) tablet   Oral   Take 1 tablet by mouth daily.           . nitroGLYCERIN (NITROSTAT) 0.4 MG SL tablet   Sublingual   Place 1 tablet (0.4 mg total) under the tongue every 5 (five) minutes x 3 doses as needed for chest pain.   25 tablet   11   . pantoprazole (PROTONIX) 40 MG tablet   Oral   Take 1 tablet (40 mg total) by mouth daily.   90 tablet   3   . tacrolimus (PROTOPIC) 0.1 % ointment   Topical   Apply topically 2 (two) times daily. As needed, thin layer   30 g   1     BP 151/68  Pulse 92  Temp(Src) 98.5 F (36.9 C) (Oral)  Resp 16  SpO2 98%  Physical Exam  Nursing note and vitals reviewed. Constitutional: She is oriented to person, place, and time. She appears well-developed and well-nourished.  Non-toxic appearance. No distress.  HENT:  Head: Normocephalic and atraumatic.  Eyes: Conjunctivae, EOM and lids are normal. Pupils are equal, round, and reactive to light.  Neck: Normal range of motion. Neck supple. No tracheal deviation present. No mass present.  Cardiovascular: Normal rate, regular rhythm and normal heart sounds.  Exam reveals no gallop.   No murmur heard. Pulmonary/Chest: Effort normal and breath sounds normal. No stridor. No respiratory distress. She has no decreased breath sounds. She has no wheezes. She has no rhonchi. She has no rales.  Abdominal: Soft. Normal appearance and bowel sounds are normal. She exhibits no distension. There is no tenderness. There is no rebound and no CVA tenderness.  Musculoskeletal: Normal range of motion. She exhibits no edema and no tenderness.  Neurological: She is alert and oriented to person, place, and time. She has normal  strength. No cranial  nerve deficit or sensory deficit. GCS eye subscore is 4. GCS verbal subscore is 5. GCS motor subscore is 6.  Skin: Skin is warm and dry. No abrasion and no rash noted.  Psychiatric: She has a normal mood and affect. Her speech is normal and behavior is normal.    ED Course  Procedures (including critical care time)  Labs Reviewed  COMPREHENSIVE METABOLIC PANEL  CBC WITH DIFFERENTIAL  PROTIME-INR  APTT  TYPE AND SCREEN   No results found.   No diagnosis found.    MDM  Pt transfused with prbcs and will be admitted by medicine        Leota Jacobsen, MD 09/17/12 2014

## 2012-09-17 NOTE — Telephone Encounter (Signed)
LVOM FOR PT TO RETURN CALL IN RE NP APPT.  °

## 2012-09-17 NOTE — Progress Notes (Signed)
Pt transferred to [facility] from emergency department. Shortly after patient was transferred, emergency department called to indicate that patient needed to be in [facility] instead. Report given to [place], RN of [address] [place]. Pt transferred to [employer]. Pt tolerated transfer well. Husband at bedside. Lindsay Doyle

## 2012-09-17 NOTE — ED Notes (Signed)
Reports having fatigue for over one week, has been to pcp and had bloodwork and sent here due to hgb 7.4. Reports having increase in bruising and dark stools. Pt appears pale. Airway intact, resp e/u. Skin w/d.

## 2012-09-17 NOTE — Telephone Encounter (Signed)
Patient informed of MD instructions and she was on her way at the time to the lab.  She did agree to MD instructions.

## 2012-09-17 NOTE — Telephone Encounter (Signed)
Message copied by Jamesetta Orleans on Fri Sep 17, 2012  9:09 AM ------      Message from: Biagio Borg      Created: Fri Sep 17, 2012  8:39 AM      Regarding: RE: pt with probable hemolytic anemia       Thanks for your response.  Dr Jana Hakim may not have been aware of your being out of town, and this was not mentioned by Amy, your nurse when I was able to talk to her late yesterday. If I understood Amy, she indicated she would call the pt to be seen today, I presume now by other MD.              Shirlean Mylar, can you call Amy (at heme/oncology) - Dr Synthia Innocent nurse, to find out if she was able to contact pt and arrange for f/u.  Thanks            ----- Message -----         From: Annia Belt, MD         Sent: 09/16/2012   6:28 PM           To: Biagio Borg, MD      Subject: RE: pt with probable hemolytic anemia                    John  I am out of town   Earliest I can see her is next Wed   If this is OK  Will schedule for 10  AM      Clair Gulling      ----- Message -----         From: Biagio Borg, MD         Sent: 09/16/2012   3:35 PM           To: Annia Belt, MD      Subject: pt with probable hemolytic anemia                        Dr Darnell Level - I tried to call you and your nurse today, unable to get through.  I was hoping you would consider seeing this pt quickl, who has weakness and fatigue and prob hemolytic anemia with hgb 8.5 and low haptoglobin (see other in labs done today); thanks             ------

## 2012-09-17 NOTE — ED Notes (Signed)
Patient is resting comfortably. 

## 2012-09-18 DIAGNOSIS — M810 Age-related osteoporosis without current pathological fracture: Secondary | ICD-10-CM

## 2012-09-18 DIAGNOSIS — D649 Anemia, unspecified: Secondary | ICD-10-CM | POA: Diagnosis not present

## 2012-09-18 DIAGNOSIS — I214 Non-ST elevation (NSTEMI) myocardial infarction: Secondary | ICD-10-CM

## 2012-09-18 DIAGNOSIS — I4891 Unspecified atrial fibrillation: Secondary | ICD-10-CM | POA: Diagnosis not present

## 2012-09-18 DIAGNOSIS — E785 Hyperlipidemia, unspecified: Secondary | ICD-10-CM

## 2012-09-18 DIAGNOSIS — R5381 Other malaise: Secondary | ICD-10-CM

## 2012-09-18 LAB — COMPREHENSIVE METABOLIC PANEL
ALT: 13 U/L (ref 0–35)
Alkaline Phosphatase: 52 U/L (ref 39–117)
CO2: 28 mEq/L (ref 19–32)
GFR calc Af Amer: 64 mL/min — ABNORMAL LOW (ref >90)
GFR calc non Af Amer: 56 mL/min — ABNORMAL LOW (ref >90)
Glucose, Bld: 95 mg/dL (ref 70–99)
Potassium: 3.8 mEq/L (ref 3.5–5.1)
Sodium: 143 mEq/L (ref 135–145)
Total Bilirubin: 0.6 mg/dL (ref 0.3–1.2)

## 2012-09-18 LAB — CBC WITH DIFFERENTIAL/PLATELET
Hemoglobin: 11.8 g/dL — ABNORMAL LOW (ref 12.0–15.0)
Lymphocytes Relative: 23 % (ref 12–46)
Lymphs Abs: 1.4 10*3/uL (ref 0.7–4.0)
MCV: 91.5 fL (ref 78.0–100.0)
Monocytes Relative: 14 % — ABNORMAL HIGH (ref 3–12)
Neutrophils Relative %: 62 % (ref 43–77)
Platelets: 156 10*3/uL (ref 150–400)
RBC: 3.75 MIL/uL — ABNORMAL LOW (ref 3.87–5.11)
WBC: 6.3 10*3/uL (ref 4.0–10.5)

## 2012-09-18 LAB — GLUCOSE, CAPILLARY: Glucose-Capillary: 91 mg/dL (ref 70–99)

## 2012-09-18 LAB — BASIC METABOLIC PANEL
Calcium: 8.9 mg/dL (ref 8.4–10.5)
Creatinine, Ser: 0.93 mg/dL (ref 0.50–1.10)
GFR calc Af Amer: 67 mL/min — ABNORMAL LOW (ref >90)
GFR calc non Af Amer: 58 mL/min — ABNORMAL LOW (ref >90)

## 2012-09-18 MED ORDER — PREDNISONE 50 MG PO TABS
1.0000 mg/kg/d | ORAL_TABLET | Freq: Every day | ORAL | Status: DC
  Administered 2012-09-18: 50 mg via ORAL
  Filled 2012-09-18 (×4): qty 1

## 2012-09-18 MED ORDER — APIXABAN 2.5 MG PO TABS
2.5000 mg | ORAL_TABLET | Freq: Two times a day (BID) | ORAL | Status: DC
  Administered 2012-09-18 – 2012-09-19 (×3): 2.5 mg via ORAL
  Filled 2012-09-18 (×4): qty 1

## 2012-09-18 NOTE — Consult Note (Signed)
CARDIOLOGY CONSULT NOTE   Patient ID: Lindsay Doyle MRN: [id number] DOB/AGE: 1933-11-18 79 y.o.  Admit date: 09/17/2012  Primary Physician   Cathlean Cower, MD Primary Cardiologist   DM Reason for Consultation   Anticoagulation  [id number] Lindsay Doyle is a 79 y.o. female with a history of PAF RVR causing a NSTEMI in January 2014.   Since then, she has been on Eliquis. She came to the hospital on 09/17/2012 with weakness and possible darks tools. She was anemic with a hemoglobin of 7.4. She has a history of hemolytic anemia and stool was negative for cold blood. Previous GI workup showed only mild gastritis. She was transfused and her hemoglobin has improved. However, because of the anemia, the patient is reluctant to take her Eliquis and cardiology was asked to address this.  Lindsay Doyle is concerned about her anemia. She is aware that the GI workup did not show anything and he is compliant with her appointments, having seen a hematologist. She does not have any signs or symptoms of bleeding in the GI workup was negative. Since being transfused, she is asymptomatic and feels well. She is not having any palpitations and has no other cardiac concerns.   Past Medical History  Diagnosis Date  . HYPERCHOLESTEROLEMIA 09/17/2009  . MITRAL VALVE PROLAPSE 03/09/2007    Remotely - last echo 2011 did not show this  . ANEMIA-IRON DEFICIENCY 03/09/2007  . LEUKOPENIA, MILD 05/30/2008  . HYPERTENSION 11/12/2006  . COPD 03/09/2007    ? pt is unsure  . OSTEOARTHRITIS 02/19/2009    Spinal  . OSTEOPOROSIS 11/12/2006  . COLONIC POLYPS, HX OF 03/09/2007  . HYPERGLYCEMIA 07/02/2007  . ASYMPTOMATIC POSTMENOPAUSAL STATUS 07/17/2008  . PSVT     a. Remotely, in setting of anemia.  [name] Positive H. pylori test 10/1996  . Cataract   . Blood transfusion     a. 05/2010: 3 transfusions for anemia/GIB.  [name] GI bleed     a. 05/2010: Hgb 7 at outside hospital s/p 3 u PRBC, EGD 05/2010 mild gastritis, small hiatal hernia, Schatzki  ring, negative colonoscopy; capsule endoscopy 06/2010 - possible nonspecific inflammatory changes.  [name] Hx of echocardiogram     a. echo 2/11:  EF 55-60%, mild AI, mild RAE, mild to mod TR  . NSTEMI (non-ST elevated myocardial infarction) 03/2012    a. Type II in setting of AF with RVR 03/2012  . Atrial fibrillation     a. Apixaban started 03/2012  . CAD (coronary artery disease)     a. LHC 1/14:  mLAD serial 30 and 60%, oRCA 30%, EF 60%  . Hx of echocardiogram     a. Echo 2/14:  Mild focal basal and mild LVH, EF 60-65%, mild AI, mild RAE, trivia effusion  . Spinal stenosis of lumbar region 07/16/2012     Past Surgical History  Procedure Laterality Date  . Abdominal hysterectomy    . Tonsillectomy    . Tubal ligation    . Cataract extraction    . Cardiac catheterization  1992    S/P in 1992 at [facility] in [place]. This was negative for any coronary artery disease  . Carotid duplex  08/29/2003  . Ceasarean      Allergies  Allergen Reactions  . Azithromycin Other (See Comments)    diarrhea  . Cefuroxime Axetil Other (See Comments)    diarrhea  . Clarithromycin Other (See Comments)    diarrhea  . Hydrocod Polst-Cpm Polst Er  Other (See Comments)    Reaction unknown  . Metronidazole Other (See Comments)    Pt had difficulty swallowing this and says it was "horrible" to take. No allergic reaction.  . Rofecoxib     REACTION: unspecified    I have reviewed the patient's current medications . metoprolol succinate  25 mg Oral QPC breakfast  . [START ON 09/21/2012] pantoprazole (PROTONIX) IV  40 mg Intravenous Q12H  . sodium chloride  3 mL Intravenous Q12H  . tacrolimus   Topical BID   . sodium chloride 100 mL/hr at 09/17/12 2225  . pantoprozole (PROTONIX) infusion 8 mg/hr (09/17/12 2316)   acetaminophen, acetaminophen, fluticasone, nitroGLYCERIN, ondansetron (ZOFRAN) IV, ondansetron  Prior to Admission medications   Medication Sig Start Date End Date  Taking? Authorizing Provider  acetaminophen (TYLENOL) 500 MG tablet Take 1,000 mg by mouth every 6 (six) hours as needed. For pain/fever   Yes Historical Provider, MD  apixaban (ELIQUIS) 2.5 MG TABS tablet Take 1 tablet (2.5 mg total) by mouth 2 (two) times daily. 04/27/12  Yes Larey Dresser, MD  atorvastatin (LIPITOR) 20 MG tablet Take 1 tablet (20 mg total) by mouth daily at 6 PM. 07/16/12  Yes Biagio Borg, MD  b complex vitamins tablet Take 1 tablet by mouth 2 (two) times a week.    Yes Historical Provider, MD  Calcium Carbonate-Vitamin D (CALTRATE 600+D) 600-400 MG-UNIT per tablet Take 1 tablet by mouth daily.     Yes Historical Provider, MD  fluticasone (CUTIVATE) 0.05 % cream Use as directed up to 4 times per day as needed for leg rash 04/27/12  Yes Biagio Borg, MD  Iron 66 MG TABS Take 1 tablet by mouth every other day.   Yes Historical Provider, MD  metoprolol succinate (TOPROL-XL) 50 MG 24 hr tablet Take 25 mg by mouth daily. Take with or immediately following a meal.   Yes Historical Provider, MD  Multiple Vitamin (MULTIVITAMIN) tablet Take 1 tablet by mouth daily.     Yes Historical Provider, MD  nitroGLYCERIN (NITROSTAT) 0.4 MG SL tablet Place 1 tablet (0.4 mg total) under the tongue every 5 (five) minutes x 3 doses as needed for chest pain. 04/24/12  Yes Scott T Kathlen Mody, PA-C  pantoprazole (PROTONIX) 40 MG tablet Take 1 tablet (40 mg total) by mouth daily. 07/16/12  Yes Biagio Borg, MD  tacrolimus (PROTOPIC) 0.1 % ointment Apply topically 2 (two) times daily. As needed, thin layer 04/27/12  Yes Biagio Borg, MD     History   Social History  . Marital Status: Married    Spouse Name: N/A    Number of Children: N/A  . Years of Education: N/A   Occupational History  .      retired   Social History Main Topics  . Smoking status: Never Smoker   . Smokeless tobacco: Not on file  . Alcohol Use: No  . Drug Use: No  . Sexually Active: Not on file   Other Topics Concern  . Not on  file   Social History Narrative  .  lives with husband     Family Status  Relation Status Death Age  . Brother      brother with NHL   Family History  Problem Relation Age of Onset  . Cancer Mother     stomach cancer  . Sarcoidosis Sister   . Cancer Brother     colon cancer  . Sarcoidosis Other   . CAD Neg  Hx      ROS:  Full 14 point review of systems complete and found to be negative unless listed above.  Physical Exam: Blood pressure 160/84, pulse 89, temperature 98.2 F (36.8 C), temperature source Oral, resp. rate 18, height 5\' 4"  (1.626 m), weight 110 lb 6.4 oz (50.077 kg), SpO2 100.00%.  General: Well developed, well nourished, female in no acute distress Head: Eyes PERRLA, No xanthomas.   Normocephalic and atraumatic, oropharynx without edema or exudate. Dentition: good Lungs: Very few rales Heart: HRRR S1 S2, no rub/gallop, no significant murmur. pulses are 2+ all 4 extrem.   Neck: No carotid bruits. No lymphadenopathy.  JVD not elevated. Abdomen: Bowel sounds present, abdomen soft and non-tender without masses or hernias noted. Msk:  No spine or cva tenderness. No weakness, no joint deformities or effusions. Extremities: No clubbing or cyanosis. No edema.  Neuro: Alert and oriented X 3. No focal deficits noted. Psych:  Good affect, responds appropriately Skin: No rashes or lesions noted.  Labs:   Lab Results  Component Value Date   WBC 7.9 09/17/2012   HGB 8.2* 09/17/2012   HCT 23.4* 09/17/2012   MCV 97.9 09/17/2012   PLT 189 09/17/2012    Recent Labs  09/17/12 1708  INR 1.00    Recent Labs Lab 09/17/12 1708  NA 143  K 4.1  CL 104  CO2 28  BUN 14  CREATININE 0.95  CALCIUM 9.7  PROT 7.1  BILITOT 0.3  ALKPHOS 62  ALT 16  AST 22  GLUCOSE 110*   Magnesium  Date Value Range Status  09/14/2012 2.1  1.5 - 2.5 mg/dL Final   Lab Results  Component Value Date   CHOL 158 09/14/2012   HDL 71.70 09/14/2012   LDLCALC 68 09/14/2012   TRIG 90.0  09/14/2012   TSH  Date/Time Value Range Status  09/14/2012  4:56 PM 1.58  0.35 - 5.50 uIU/mL Final   Echo: 05/20/2012 Study Conclusions - Left ventricle: The cavity size was normal. There was mild focal basal and mild concentric hypertrophy of the septum. Systolic function was normal. The estimated ejection fraction was in the range of 60% to 65%. Wall motion was normal; there were no regional wall motion abnormalities. - Aortic valve: Mild regurgitation. - Right atrium: The atrium was mildly dilated. - Atrial septum: No defect or patent foramen ovale was identified. - Pericardium, extracardiac: A trivial pericardial effusion was identified posterior to the heart.  ASSESSMENT AND PLAN:   The patient was seen today by Dr Johnsie Cancel, the patient evaluated and the data reviewed.   Anticoagulation with Eliquis: Reviewed prior workup including an extensive GI workup and evaluation by hematology. Explained that although the major side effect and complication of Eliquis is bleeding, we have no data that suggest she is bleeding. Advised her that with her history of hemolytic anemia, she should followup with hematology but do not believe that the Eliquis plays a role. Encouraged her to discuss the issues with M.D. before making a final decision on whether she is willing to take the Eliquis but encouraged her to do so because of the reduction in stroke risk.  Otherwise, per primary M.D. Principal Problem:   Anemia Active Problems:   A-fib   Patient examined chart reviewed. Discussed with patient , husband and son.  She is very agitated and confrontational about her care this am.  Has been argumentative with IM and nurse. I explained to her the rational for Eliquis.  The safety of  it especially at lower dose given her. I agree I do not think it relates to any hemolytic anemia. She is willing to take it. She will wait on steroids until she sees hematology or has proof of hemolysis.    Jenkins Rouge

## 2012-09-18 NOTE — Progress Notes (Signed)
Patient ID: Lindsay Doyle  female  [id number]    DOB: Jul 27, 1933    DOA: 09/17/2012  PCP: Lindsay Cower, MD  Assessment/Plan: Principal Problem:   Anemia: Likely hemolytic component, patient is not having any GI bleed, stool occult test is negative - Haptoglobin is low 40, LDH is within normal limits, Coombs' test, G6 PD pending, reticulocyte count elevated to 3.85 - Discussed with hematology on call, Lindsay Doyle recommended to start prednisone trial, 1mg /kg daily and assess improvement in in next 48 hours.  - Patient however requested to read about prednisone before she will start it. - Status post 2 units packed RBC transfusion  Active Problems:   HYPERTENSION - Currently stable    Coronary Artery Disease - Currently stable no chest pain or any shortness of breath    A-fib - Currently rate controlled, patient has been on eliquis for anticoagulation. Patient is however not bleeding, stool occult test is negative and she had GI workup which was negative as well. Patient requested cardiology to discuss with her whether she needs to be on eliquis.   DVT Prophylaxis:  Code Status:  Disposition:    Subjective: Patient somewhat frustrated and agitated regarding her medical issue, family members in the room explaining to the patient as well.   Objective: Weight change:   Intake/Output Summary (Last 24 hours) at 09/18/12 1120 Last data filed at 09/18/12 0928  Gross per 24 hour  Intake    125 ml  Output   1800 ml  Net  -1675 ml   Blood pressure 150/87, pulse 83, temperature 98.5 F (36.9 C), temperature source Oral, resp. rate 20, height 5\' 4"  (1.626 m), weight 50.077 kg (110 lb 6.4 oz), SpO2 100.00%.  Physical Exam: General: Alert and awake, oriented x3, not in any acute distress. HEENT: anicteric sclera, PERLA, EOMI CVS: S1-S2 clear, no murmur rubs or gallops Chest: clear to auscultation bilaterally, no wheezing, rales or rhonchi Abdomen: soft nontender, nondistended,  normal bowel sounds  Extremities: no cyanosis, clubbing or edema noted bilaterally Neuro: Cranial nerves II-XII intact, no focal neurological deficits  Lab Results: Basic Metabolic Panel:  Recent Labs Lab 09/14/12 1656 09/17/12 1708 09/18/12 0920  NA 139 143 143  144  K 5.1 4.1 3.8  3.7  CL 104 104 107  107  CO2 28 28 28  27   GLUCOSE 92 110* 95  96  BUN 22 14 8  8   CREATININE 1.0 0.95 0.96  0.93  CALCIUM 9.4 9.7 8.9  8.9  MG 2.1  --   --    Liver Function Tests:  Recent Labs Lab 09/17/12 1708 09/18/12 0920  AST 22 21  ALT 16 13  ALKPHOS 62 52  BILITOT 0.3 0.6  PROT 7.1 6.2  ALBUMIN 4.2 3.5   No results found for this basename: LIPASE, AMYLASE,  in the last 168 hours No results found for this basename: AMMONIA,  in the last 168 hours CBC:  Recent Labs Lab 09/17/12 1708 09/18/12 0920  WBC 7.9 6.3  NEUTROABS 4.9 3.9  HGB 8.2* 11.8*  HCT 23.4* 34.3*  MCV 97.9 91.5  PLT 189 156   Cardiac Enzymes: No results found for this basename: CKTOTAL, CKMB, CKMBINDEX, TROPONINI,  in the last 168 hours BNP: No components found with this basename: POCBNP,  CBG:  Recent Labs Lab 09/18/12 0739  GLUCAP 91     Micro Results: No results found for this or any previous visit (from the past 240 hour(s)).  Studies/Results: No  results found.  Medications: Scheduled Meds: . apixaban  2.5 mg Oral BID  . metoprolol succinate  25 mg Oral QPC breakfast  . [START ON 09/21/2012] pantoprazole (PROTONIX) IV  40 mg Intravenous Q12H  . predniSONE  1 mg/kg/day Oral Q breakfast  . sodium chloride  3 mL Intravenous Q12H  . tacrolimus   Topical BID      LOS: 1 day   Lindsay Doyle M.D. [facility] 09/18/2012, 11:20 AM Pager: [phone]  If 7PM-7AM, please contact night-coverage [email or web address] Password [email or web address]

## 2012-09-19 DIAGNOSIS — D649 Anemia, unspecified: Secondary | ICD-10-CM | POA: Diagnosis not present

## 2012-09-19 DIAGNOSIS — J449 Chronic obstructive pulmonary disease, unspecified: Secondary | ICD-10-CM | POA: Diagnosis not present

## 2012-09-19 DIAGNOSIS — I4891 Unspecified atrial fibrillation: Secondary | ICD-10-CM | POA: Diagnosis not present

## 2012-09-19 DIAGNOSIS — I2510 Atherosclerotic heart disease of native coronary artery without angina pectoris: Secondary | ICD-10-CM | POA: Diagnosis not present

## 2012-09-19 LAB — CBC
MCH: 31.7 pg (ref 26.0–34.0)
MCV: 91.4 fL (ref 78.0–100.0)
Platelets: 185 10*3/uL (ref 150–400)
RBC: 3.97 MIL/uL (ref 3.87–5.11)

## 2012-09-19 LAB — TYPE AND SCREEN: Unit division: 0

## 2012-09-19 MED ORDER — POLYETHYLENE GLYCOL 3350 17 G PO PACK: 17.0000 g | PACK | Freq: Once | ORAL | Status: DC

## 2012-09-19 NOTE — Progress Notes (Signed)
Patient ID: Lindsay Doyle  female  [id number]    DOB: 31-Oct-1933    DOA: 09/17/2012  PCP: Cathlean Cower, MD  Assessment/Plan: Principal Problem:   Anemia: Likely hemolytic component, stool occult test was negative on 6/21, however repeat positive on 6/22 - Haptoglobin is low 40, LDH is within normal limits, Coombs' test, G6 PD pending, reticulocyte count elevated to 3.85 - Discussed with hematology on call, Dr. Julien Nordmann, started on prednisone trial, 1mg /kg daily, so far H&H holding stable after 2 units packed RBC transfusion - I discussed with on-call gastroenterology, Dr. Collene Mares, patient had a negative workup done in 2012 with EGD, coloscopy and capsule endoscopy. Did not recommend any acute intervention at this time. However if the hemoglobin is lower tomorrow or stool occult test is positive again, I will discuss with her primary gastroenterologist, Dr. Deatra Ina.  Active Problems:   HYPERTENSION - Currently stable    Coronary Artery Disease - Currently stable no chest pain or any shortness of breath    A-fib - Currently rate controlled - I will hold eliquis  DVT Prophylaxis: SCDs  Code Status:  Disposition:    Subjective: Patient felt a whole lot better today when I examined her earlier this morning. However when I explained her stool occult test was positive later, she blamed it on eliquis.   Objective: Weight change:   Intake/Output Summary (Last 24 hours) at 09/19/12 1119 Last data filed at 09/19/12 0800  Gross per 24 hour  Intake    420 ml  Output   2250 ml  Net  -1830 ml   Blood pressure 152/90, pulse 82, temperature 98.2 F (36.8 C), temperature source Oral, resp. rate 18, height 5\' 4"  (1.626 m), weight 50.077 kg (110 lb 6.4 oz), SpO2 98.00%.  Physical Exam: General: Alert and awake, oriented x3, not in any acute distress. CVS: S1-S2 clear, no murmur rubs or gallops Chest: clear to auscultation bilaterally, no wheezing, rales or rhonchi Abdomen: soft nontender,  nondistended, normal bowel sounds  Extremities: no cyanosis, clubbing or edema noted bilaterally  Lab Results: Basic Metabolic Panel:  Recent Labs Lab 09/14/12 1656 09/17/12 1708 09/18/12 0920  NA 139 143 143  144  K 5.1 4.1 3.8  3.7  CL 104 104 107  107  CO2 28 28 28  27   GLUCOSE 92 110* 95  96  BUN 22 14 8  8   CREATININE 1.0 0.95 0.96  0.93  CALCIUM 9.4 9.7 8.9  8.9  MG 2.1  --   --    Liver Function Tests:  Recent Labs Lab 09/17/12 1708 09/18/12 0920  AST 22 21  ALT 16 13  ALKPHOS 62 52  BILITOT 0.3 0.6  PROT 7.1 6.2  ALBUMIN 4.2 3.5   No results found for this basename: LIPASE, AMYLASE,  in the last 168 hours No results found for this basename: AMMONIA,  in the last 168 hours CBC:  Recent Labs Lab 09/18/12 0920 09/19/12 0905  WBC 6.3 11.4*  NEUTROABS 3.9  --   HGB 11.8* 12.6  HCT 34.3* 36.3  MCV 91.5 91.4  PLT 156 185   Cardiac Enzymes: No results found for this basename: CKTOTAL, CKMB, CKMBINDEX, TROPONINI,  in the last 168 hours BNP: No components found with this basename: POCBNP,  CBG:  Recent Labs Lab 09/18/12 0739  GLUCAP 91     Micro Results: No results found for this or any previous visit (from the past 240 hour(s)).  Studies/Results: No results found.  Medications:  Scheduled Meds: . metoprolol succinate  25 mg Oral QPC breakfast  . [START ON 09/21/2012] pantoprazole (PROTONIX) IV  40 mg Intravenous Q12H  . polyethylene glycol  17 g Oral Once  . predniSONE  1 mg/kg/day Oral Q breakfast  . sodium chloride  3 mL Intravenous Q12H  . tacrolimus   Topical BID      LOS: 2 days   RAI,RIPUDEEP M.D. [facility] 09/19/2012, 11:19 AM Pager: [phone]  If 7PM-7AM, please contact night-coverage [email or web address] Password [email or web address]

## 2012-09-20 ENCOUNTER — Encounter: Payer: Self-pay | Admitting: Gastroenterology

## 2012-09-20 ENCOUNTER — Telehealth: Payer: Self-pay | Admitting: Internal Medicine

## 2012-09-20 DIAGNOSIS — J45909 Unspecified asthma, uncomplicated: Secondary | ICD-10-CM

## 2012-09-20 DIAGNOSIS — D509 Iron deficiency anemia, unspecified: Secondary | ICD-10-CM | POA: Diagnosis not present

## 2012-09-20 DIAGNOSIS — I4891 Unspecified atrial fibrillation: Secondary | ICD-10-CM | POA: Diagnosis not present

## 2012-09-20 DIAGNOSIS — D649 Anemia, unspecified: Secondary | ICD-10-CM | POA: Diagnosis not present

## 2012-09-20 LAB — CBC
MCH: 31.5 pg (ref 26.0–34.0)
MCV: 92.3 fL (ref 78.0–100.0)
Platelets: 170 10*3/uL (ref 150–400)
RBC: 3.75 MIL/uL — ABNORMAL LOW (ref 3.87–5.11)
RDW: 17.5 % — ABNORMAL HIGH (ref 11.5–15.5)
WBC: 9.2 10*3/uL (ref 4.0–10.5)

## 2012-09-20 LAB — HAPTOGLOBIN: Haptoglobin: 41 mg/dL — ABNORMAL LOW (ref 45–215)

## 2012-09-20 LAB — GLUCOSE 6 PHOSPHATE DEHYDROGENASE: G-6PDH: 11.3 U/g Hgb (ref 7.0–20.5)

## 2012-09-20 NOTE — Telephone Encounter (Signed)
i would keep the appt with Dr Darnell Level, as there is still the possibility of hemolytic anemia

## 2012-09-20 NOTE — Discharge Summary (Signed)
Physician Discharge Summary  Patient ID: Lindsay Doyle MRN: [id number] DOB/AGE: Jun 11, 1933 79 y.o.  Admit date: 09/17/2012 Discharge date: 09/20/2012  Primary Care Physician:  Cathlean Cower, MD  Discharge Diagnoses:    . Anemia the possibility of chronic hemolysis . A-fib . HYPERTENSION . Coronary Artery Disease Paroxysmal atrial fibrillation   Consults:  Cardiology Gastroenterology Hematology   Recommendations for Outpatient Follow-up:  1) followup scheduled with gastroenterology  Allergies:   Allergies  Allergen Reactions  . Azithromycin Other (See Comments)    diarrhea  . Cefuroxime Axetil Other (See Comments)    diarrhea  . Clarithromycin Other (See Comments)    diarrhea  . Hydrocod Polst-Cpm Polst Er Other (See Comments)    Reaction unknown  . Metronidazole Other (See Comments)    Pt had difficulty swallowing this and says it was "horrible" to take. No allergic reaction.  . Rofecoxib     REACTION: unspecified     Discharge Medications:   Medication List    STOP taking these medications       apixaban 2.5 MG Tabs tablet  Commonly known as:  ELIQUIS      TAKE these medications       acetaminophen 500 MG tablet  Commonly known as:  TYLENOL  Take 1,000 mg by mouth every 6 (six) hours as needed. For pain/fever     atorvastatin 20 MG tablet  Commonly known as:  LIPITOR  Take 1 tablet (20 mg total) by mouth daily at 6 PM.     b complex vitamins tablet  Take 1 tablet by mouth 2 (two) times a week.     CALTRATE 600+D 600-400 MG-UNIT per tablet  Generic drug:  Calcium Carbonate-Vitamin D  Take 1 tablet by mouth daily.     fluticasone 0.05 % cream  Commonly known as:  CUTIVATE  Use as directed up to 4 times per day as needed for leg rash     Iron 66 MG Tabs  Take 1 tablet by mouth every other day.     metoprolol succinate 50 MG 24 hr tablet  Commonly known as:  TOPROL-XL  Take 25 mg by mouth daily. Take with or immediately following a meal.     multivitamin tablet  Take 1 tablet by mouth daily.     nitroGLYCERIN 0.4 MG SL tablet  Commonly known as:  NITROSTAT  Place 1 tablet (0.4 mg total) under the tongue every 5 (five) minutes x 3 doses as needed for chest pain.     pantoprazole 40 MG tablet  Commonly known as:  PROTONIX  Take 1 tablet (40 mg total) by mouth daily.     tacrolimus 0.1 % ointment  Commonly known as:  PROTOPIC  Apply topically 2 (two) times daily. As needed, thin layer         Brief H and P: For complete details please refer to admission H and P, but in brief Lindsay Doyle is a 79 y.o. female with known history of atrial fibrillation on apixaban started experiencing weakness over the last one week with fatigue. Denies any chest pain shortness of breath nausea vomiting abdominal pain. Patient has been having off-and-on dark stools. Patient had gone to her PCP recently and hemoglobin was found to be around 8 marked drop from recent of 13. Patient's haptoglobin was found to be low and was referred to hematologist for hemolytic anemia management. Since patient's weakness got more worse patient decided to come to the ER. Repeat hemoglobin done shows  was around 8 and at this time patient has been admitted for further management. Stool for occult blood has been negative. Patient has had previous GI bleeding 2012 where in coloscopy was negative and EGD showed mild gastritis. Patient also had capsule endoscopy. As per patient the cause for GI bleed was not known.   Hospital Course:   Anemia: Hemoglobin was 7.4 with hematocrit of 21.8 at the time of admission on 09/17/12. Likely hemolytic component, stool occult test was negative on 6/21, however repeat positive on 6/22  - Haptoglobin is low 40, LDH is within normal limits, Coombs' test negative, G6 PD 11.3 within normal limits, reticulocyte count elevated to 3.85. Initially, I discussed with hematology on call, Dr. Julien Nordmann on 09/18/12 and recommended prednisone trial,  1mg /kg daily. Patient received a prednisone for 2 days. I discussed with Dr. Beryle Beams on phone today who reviewed the records and do not feel that patient has acute hemolytic anemia and recommended to discontinue the steroids. Patient was transfused 2 units packed rbc's on 09/18/2012, so far H&H stable at 11.8/34.6. The patient had extensive GI workup in 2012. Capsule Endoscopy in 06/2010: Capsule endoscopy demonstrated scattered patches of erythema in the small bowel. She may have had inflammatory disease causing her bleeding. (Dr Deatra Ina).  EGD in 3/ 2012 : mild gastritis,small hiatal hernia, schatzki's ring at GE junction. C scope in 05/2010: normal (Dr Ardis Hughs).  She has been on eliquis due to recent NSTEMI and paroxysmal atrial fibrillation in 03/2012, it is quite possible that patient may have a component of chronic hemolysis but also intermittent slow GI bleed due to eliquis. FOBT on 09/19/12 was positive, patient believes that eliquis is causing the anemia and has decided to stop eliquis. She is frustrated and she reports that she has noticed bruising on her knees and bleeding since starting eliquis.  For now, I have placed Eliquis on hold until she discusses her decision with her primary cardiologist, Dr. [name].    HYPERTENSION  - Currently stable   Coronary Artery Disease  - Currently stable no chest pain or any shortness of breath   Paroxysmal A-fib  - Currently rate controlled, hold eliquis until followup appointment with Dr. [name]. Cardiology consultation was also obtained and patient was seen by Dr. Johnsie Cancel. Patient was explained the high risk of CVA/TIA or neurological events in the light of atrial fibrillation without anticoagulation. The patient reported that she understands the implications and will discuss with Dr. [name].   Day of Discharge BP 147/83  Pulse 86  Temp(Src) 98.3 F (36.8 C) (Oral)  Resp 18  Ht 5\' 4"  (1.626 m)  Wt 50.077 kg (110 lb 6.4 oz)  BMI 18.94 kg/m2  SpO2  100%  Physical Exam: General: Alert and awake oriented x3 not in any acute distress. CVS: S1-S2 clear no murmur rubs or gallops Chest: clear to auscultation bilaterally, no wheezing rales or rhonchi Abdomen: soft nontender, nondistended, normal bowel sounds, no organomegaly Extremities: no cyanosis, clubbing or edema noted bilaterally Neuro: Cranial nerves II-XII intact, no focal neurological deficits   The results of significant diagnostics from this hospitalization (including imaging, microbiology, ancillary and laboratory) are listed below for reference.    LAB RESULTS: Basic Metabolic Panel:  Recent Labs Lab 09/14/12 1656 09/17/12 1708 09/18/12 0920  NA 139 143 143  144  K 5.1 4.1 3.8  3.7  CL 104 104 107  107  CO2 28 28 28  27   GLUCOSE 92 110* 95  96  BUN 22 14  8  8  CREATININE 1.0 0.95 0.96  0.93  CALCIUM 9.4 9.7 8.9  8.9  MG 2.1  --   --    Liver Function Tests:  Recent Labs Lab 09/17/12 1708 09/18/12 0920  AST 22 21  ALT 16 13  ALKPHOS 62 52  BILITOT 0.3 0.6  PROT 7.1 6.2  ALBUMIN 4.2 3.5   No results found for this basename: LIPASE, AMYLASE,  in the last 168 hours No results found for this basename: AMMONIA,  in the last 168 hours CBC:  Recent Labs Lab 09/18/12 0920 09/19/12 0905 09/20/12 0420  WBC 6.3 11.4* 9.2  NEUTROABS 3.9  --   --   HGB 11.8* 12.6 11.8*  HCT 34.3* 36.3 34.6*  MCV 91.5 91.4 92.3  PLT 156 185 170   Cardiac Enzymes: No results found for this basename: CKTOTAL, CKMB, CKMBINDEX, TROPONINI,  in the last 168 hours BNP: No components found with this basename: POCBNP,  CBG:  Recent Labs Lab 09/18/12 0739  GLUCAP 91    Significant Diagnostic Studies:  No results found.    Disposition and Follow-up: Discharge Orders   Future Appointments Provider Department Dept Phone   09/30/2012 10:00 AM Janett Billow D. Zehr, [facility] [phone]   10/13/2012 3:00 PM [facility] [phone]   10/13/2012 3:15 PM [place] [id number]   10/13/2012 3:30 PM Annia Belt, MD [place] [phone]   03/08/2013 9:15 AM Biagio Borg, MD [facility] [phone]   Future Orders Complete By Expires     Diet - low sodium heart healthy  As directed     Increase activity slowly  As directed         DISPOSITION: Home DIET: Heart healthy diet ACTIVITY: As tolerated  DISCHARGE FOLLOW-UP Follow-up Information   Follow up with ZEHR, JESSICA D., PA-C On 09/30/2012. (10 AM appointment for follow up with PA at Dr Kelby Fam (GI) office. )    Contact information:   [address] [place] [address] [phone]       Follow up with Annia Belt, MD On 09/22/2012. (at 9:30am. )    Contact information:   [address] [address] [phone]       Follow up with Loralie Champagne, MD. Schedule an appointment as soon as possible for a visit in 10 days. (regarding eliquis )    Contact information:   [id number] [place] [address] [phone]       Time spent on Discharge: 45 mins  Signed:   RAI,RIPUDEEP M.D. [facility] 09/20/2012, 3:20 PM Pager: [phone]

## 2012-09-20 NOTE — Progress Notes (Signed)
Utilization review completed.  

## 2012-09-20 NOTE — Telephone Encounter (Signed)
Pt just got out of the hospital.  She was dx'd with regular anemia.  Does she still need to see Dr. Beryle Beams? She has an appt July 17

## 2012-09-20 NOTE — Telephone Encounter (Signed)
Patient informed. 

## 2012-09-22 ENCOUNTER — Ambulatory Visit: Payer: Medicare Other

## 2012-09-22 ENCOUNTER — Other Ambulatory Visit: Payer: Medicare Other | Admitting: Lab

## 2012-09-22 ENCOUNTER — Encounter: Payer: Medicare Other | Admitting: Oncology

## 2012-09-23 ENCOUNTER — Encounter: Payer: Self-pay | Admitting: Internal Medicine

## 2012-09-23 ENCOUNTER — Ambulatory Visit (INDEPENDENT_AMBULATORY_CARE_PROVIDER_SITE_OTHER): Payer: Medicare Other | Admitting: Internal Medicine

## 2012-09-23 VITALS — BP 110/78 | HR 76 | Temp 97.9°F | Ht 64.0 in | Wt 113.2 lb

## 2012-09-23 DIAGNOSIS — D509 Iron deficiency anemia, unspecified: Secondary | ICD-10-CM

## 2012-09-23 DIAGNOSIS — I4891 Unspecified atrial fibrillation: Secondary | ICD-10-CM | POA: Diagnosis not present

## 2012-09-23 DIAGNOSIS — I10 Essential (primary) hypertension: Secondary | ICD-10-CM

## 2012-09-23 DIAGNOSIS — R220 Localized swelling, mass and lump, head: Secondary | ICD-10-CM | POA: Diagnosis not present

## 2012-09-23 DIAGNOSIS — R221 Localized swelling, mass and lump, neck: Secondary | ICD-10-CM | POA: Diagnosis not present

## 2012-09-23 NOTE — Assessment & Plan Note (Signed)
Unclear etiology, for ENT referral

## 2012-09-23 NOTE — Assessment & Plan Note (Signed)
F/u lab today, no further overt bruising, bleeding, will hold on further lab, pt declines heme or GI further fu

## 2012-09-23 NOTE — Assessment & Plan Note (Addendum)
Rate and volume ok, for asa 81 mg as she will accept this,  to f/u any worsening symptoms or concerns, ok for card f/u

## 2012-09-23 NOTE — Patient Instructions (Signed)
Please start Asa 81 mg - 1  Per day, which is very low risk and at least some protective when it comes to stroke and intermittent atrial fibrillation Please continue all other medications as before, and refills have been done if requested. Please have the pharmacy call with any other refills you may need. You will be contacted regarding the referral for: Dr McLean/card, and ENT for the lump to the left neck Please continue your efforts at being more active, low cholesterol diet, and weight control. I think we can hold off on more blood work at this time  Please remember to sign up for My Chart if you have not done so, as this will be important to you in the future with finding out test results, communicating by private email, and scheduling acute appointments online when needed.  Please return in 6 months, or sooner if needed

## 2012-09-23 NOTE — Assessment & Plan Note (Signed)
stable overall by history and exam, recent data reviewed with pt, and pt to continue medical treatment as before,  to f/u any worsening symptoms or concerns BP Readings from Last 3 Encounters:  09/23/12 110/78  09/20/12 142/79  09/14/12 104/70

## 2012-09-23 NOTE — Progress Notes (Signed)
Subjective:    Patient ID: Lindsay Doyle, female    DOB: May 06, 1933, 79 y.o.   MRN: [id number]  HPI Here to f/u, states remarkably improved strength and stamina  Apparently had anemia on eliquis due to mult bruising and subsequent anemia. Refuses coumadin or eliquis or other similar such as xarelto. Does not plan to kep her July 3 appt with PA (will call to cancel) since she had extensive GI w/u previously.  Had low haptoglobin but direct coombs neg x 2, so does not plan to see hematology as well. Is willing to try the low dose ASA 81 mg, and fu with cardiology. Is not aware of any lump to neck today Pt denies chest pain, increased sob or doe, wheezing, orthopnea, PND, increased LE swelling, palpitations, dizziness or syncope.  Pt denies new neurological symptoms such as new headache, or facial or extremity weakness or numbness Past Medical History  Diagnosis Date  . HYPERCHOLESTEROLEMIA 09/17/2009  . MITRAL VALVE PROLAPSE 03/09/2007    Remotely - last echo 2011 did not show this  . ANEMIA-IRON DEFICIENCY 03/09/2007  . LEUKOPENIA, MILD 05/30/2008  . HYPERTENSION 11/12/2006  . COPD 03/09/2007    ? pt is unsure  . OSTEOARTHRITIS 02/19/2009    Spinal  . OSTEOPOROSIS 11/12/2006  . COLONIC POLYPS, HX OF 03/09/2007  . HYPERGLYCEMIA 07/02/2007  . ASYMPTOMATIC POSTMENOPAUSAL STATUS 07/17/2008  . PSVT     a. Remotely, in setting of anemia.  [name] Positive H. pylori test 10/1996  . Cataract   . Blood transfusion     a. 05/2010: 3 transfusions for anemia/GIB.  [name] GI bleed     a. 05/2010: Hgb 7 at outside hospital s/p 3 u PRBC, EGD 05/2010 mild gastritis, small hiatal hernia, Schatzki ring, negative colonoscopy; capsule endoscopy 06/2010 - possible nonspecific inflammatory changes.  [name] Hx of echocardiogram     a. echo 2/11:  EF 55-60%, mild AI, mild RAE, mild to mod TR  . NSTEMI (non-ST elevated myocardial infarction) 03/2012    a. Type II in setting of AF with RVR 03/2012  . Atrial fibrillation     a. Apixaban  started 03/2012  . CAD (coronary artery disease)     a. LHC 1/14:  mLAD serial 30 and 60%, oRCA 30%, EF 60%  . Hx of echocardiogram     a. Echo 2/14:  Mild focal basal and mild LVH, EF 60-65%, mild AI, mild RAE, trivia effusion  . Spinal stenosis of lumbar region 07/16/2012   Past Surgical History  Procedure Laterality Date  . Abdominal hysterectomy    . Tonsillectomy    . Tubal ligation    . Cataract extraction    . Cardiac catheterization  1992    S/P in 1992 at [facility] in [place]. This was negative for any coronary artery disease  . Carotid duplex  08/29/2003  . Ceasarean      reports that she has never smoked. She does not have any smokeless tobacco history on file. She reports that she does not drink alcohol or use illicit drugs. family history includes Cancer in her brother and mother and Sarcoidosis in her other and sister.  There is no history of CAD. Allergies  Allergen Reactions  . Azithromycin Other (See Comments)    diarrhea  . Cefuroxime Axetil Other (See Comments)    diarrhea  . Clarithromycin Other (See Comments)    diarrhea  . Hydrocod Polst-Cpm Polst Er Other (See Comments)  Reaction unknown  . Metronidazole Other (See Comments)    Pt had difficulty swallowing this and says it was "horrible" to take. No allergic reaction.  . Rofecoxib     REACTION: unspecified   Current Outpatient Prescriptions on File Prior to Visit  Medication Sig Dispense Refill  . acetaminophen (TYLENOL) 500 MG tablet Take 1,000 mg by mouth every 6 (six) hours as needed. For pain/fever      . atorvastatin (LIPITOR) 20 MG tablet Take 1 tablet (20 mg total) by mouth daily at 6 PM.  90 tablet  3  . b complex vitamins tablet Take 1 tablet by mouth 2 (two) times a week.       . Calcium Carbonate-Vitamin D (CALTRATE 600+D) 600-400 MG-UNIT per tablet Take 1 tablet by mouth daily.        . fluticasone (CUTIVATE) 0.05 % cream Use as directed up to 4 times per day as  needed for leg rash  30 g  1  . Iron 66 MG TABS Take 1 tablet by mouth every other day.      . metoprolol succinate (TOPROL-XL) 50 MG 24 hr tablet Take 25 mg by mouth daily. Take with or immediately following a meal.      . Multiple Vitamin (MULTIVITAMIN) tablet Take 1 tablet by mouth daily.        . nitroGLYCERIN (NITROSTAT) 0.4 MG SL tablet Place 1 tablet (0.4 mg total) under the tongue every 5 (five) minutes x 3 doses as needed for chest pain.  25 tablet  11  . pantoprazole (PROTONIX) 40 MG tablet Take 1 tablet (40 mg total) by mouth daily.  90 tablet  3  . tacrolimus (PROTOPIC) 0.1 % ointment Apply topically 2 (two) times daily. As needed, thin layer  30 g  1   No current facility-administered medications on file prior to visit.   Review of Systems  Constitutional: Negative for unexpected weight change, or unusual diaphoresis  HENT: Negative for tinnitus.   Eyes: Negative for photophobia and visual disturbance.  Respiratory: Negative for choking and stridor.   Gastrointestinal: Negative for vomiting and blood in stool.  Genitourinary: Negative for hematuria and decreased urine volume.  Musculoskeletal: Negative for acute joint swelling Skin: Negative for color change and wound.  Neurological: Negative for tremors and numbness other than noted  Psychiatric/Behavioral: Negative for decreased concentration or  hyperactivity.       Objective:   Physical Exam VS noted,  Constitutional: Pt appears well-developed and well-nourished.  HENT: Head: NCAT.  Right Ear: External ear normal.  Left Ear: External ear normal.  Eyes: Conjunctivae and EOM are normal. Pupils are equal, round, and reactive to light.  Neck: Normal range of motion. Neck supple.  Cardiovascular: Normal rate and regular rhythm.   Pulmonary/Chest: Effort normal and breath sounds normal.  Abd:  Soft, NT, non-distended, + BS Neurological: Pt is alert. Not confused  Skin: Skin is warm. No erythema.  Psychiatric: Pt  behavior is normal. Thought content normal.  Has ? New left mid neck lump > 1 cm, firm/hard, but also similar but smaller lump to right as well in same position - ? Voice box related vs other     Assessment & Plan:

## 2012-09-24 ENCOUNTER — Other Ambulatory Visit (HOSPITAL_COMMUNITY): Payer: Self-pay | Admitting: Otolaryngology

## 2012-09-24 ENCOUNTER — Telehealth: Payer: Self-pay | Admitting: Oncology

## 2012-09-24 DIAGNOSIS — R599 Enlarged lymph nodes, unspecified: Secondary | ICD-10-CM | POA: Diagnosis not present

## 2012-09-24 NOTE — Telephone Encounter (Signed)
wanted to cancel pp found out pt does not have hemo. anemia...did not want to r/s

## 2012-09-30 ENCOUNTER — Ambulatory Visit: Payer: Medicare Other | Admitting: Gastroenterology

## 2012-10-12 ENCOUNTER — Encounter: Payer: Self-pay | Admitting: Physician Assistant

## 2012-10-12 ENCOUNTER — Ambulatory Visit (INDEPENDENT_AMBULATORY_CARE_PROVIDER_SITE_OTHER): Payer: Medicare Other | Admitting: Physician Assistant

## 2012-10-12 VITALS — BP 110/90 | HR 81 | Ht 64.0 in | Wt 113.0 lb

## 2012-10-12 DIAGNOSIS — E785 Hyperlipidemia, unspecified: Secondary | ICD-10-CM | POA: Diagnosis not present

## 2012-10-12 DIAGNOSIS — D649 Anemia, unspecified: Secondary | ICD-10-CM

## 2012-10-12 DIAGNOSIS — I4891 Unspecified atrial fibrillation: Secondary | ICD-10-CM

## 2012-10-12 DIAGNOSIS — I10 Essential (primary) hypertension: Secondary | ICD-10-CM

## 2012-10-12 DIAGNOSIS — I2510 Atherosclerotic heart disease of native coronary artery without angina pectoris: Secondary | ICD-10-CM

## 2012-10-12 NOTE — Patient Instructions (Addendum)
Your physician wants you to follow-up in: 4 months with Dr [name]. (October 2014).  You will receive a reminder letter in the mail two months in advance. If you don't receive a letter, please call our office to schedule the follow-up appointment.

## 2012-10-12 NOTE — Progress Notes (Signed)
Lindsay Doyle. [address], [address] [place], [place]  [address] Phone: [phone] Fax:  [phone]  Date:  10/12/2012   ID:  Lindsay Doyle, Lindsay Doyle, MRN [id number]  PCP:  Lindsay Cower, MD  Cardiologist:  Dr. Loralie Doyle     History of Present Illness: Lindsay Doyle is a 79 y.o. female who returns for f/u.    She has a hx of paroxysmal atrial fibrillation. She was admitted in 1/14 with NSTEMI in the setting of AFib with RVR. She spontaneously converted to NSR. LHC demonstrated non-obstructive disease and she was felt to have had demand ischemia. Eliquis 2.5 mg bid was added due to stroke risk. Echo 05/20/12: EF 60-65%.  Last seen by Dr. Loralie Doyle in 05/2012 with plans for 4 mos f/u.  Since then, she was admitted in 08/2012 with symptomatic anemia.  She was transfused with PRBCs.  She had 1/2 hemoccults +.  There was some question if she had a hemolytic component.  She was on steroids just briefly.  It was deemed that she did not have hemolytic anemia (Coombs neg x 2).  Prior GI workup unremarkable.  Patient decided to stop Eliquis due to concerns over GI bleeding.  She has discussed with her PCP and does not plan to take anticoagulants.  But, did agree to start ASA.  The patient denies chest pain, shortness of breath, syncope, orthopnea, PND or significant pedal edema.   Labs (3/14):  K 4.2, creatinine 1.0, LDL 84, HDL 82  Labs (6/14):  K 3.8, Cr 0.96, ALT 13, LDL 68, Hgb 7.4=>11.8, TSH 1.58   Wt Readings from Last 3 Encounters:  10/12/12 113 lb (51.256 kg)  09/23/12 113 lb 4 oz (51.37 kg)  09/17/12 110 lb 6.4 oz (50.077 kg)    Past Medical History:   1. Hyperlipidemia  2. Anemia: Fe deficienc  3. HTN  4. Osteoarthritis  5. H/o colonic polyps  6. Osteoporosis  7. GI bleed (3/12): 3 units PRBCs, EGD with mild gastritis, colonoscopy negative, capsule endoscopy with nonspecific inflammatory changes.  Readmission for anemia in 6/14 - cause unknown; patient stopped  Eliquis 8. CAD: LHC (1/14) with serial 30% and 60% LAD stenoses, 30% ostial RCA, EF 60%.  9. Paroxysmal atrial fibrillation. Echo (2/14) with EF 60-65%, mild focal basal septal hypertrophy, mild AI.   Current Outpatient Prescriptions  Medication Sig Dispense Refill  . acetaminophen (TYLENOL) 500 MG tablet Take 1,000 mg by mouth every 6 (six) hours as needed. For pain/fever      . atorvastatin (LIPITOR) 20 MG tablet Take 1 tablet (20 mg total) by mouth daily at 6 PM.  90 tablet  3  . b complex vitamins tablet Take 1 tablet by mouth 2 (two) times a week.       . Calcium Carbonate-Vitamin D (CALTRATE 600+D) 600-400 MG-UNIT per tablet Take 1 tablet by mouth daily.        . fluticasone (CUTIVATE) 0.05 % cream Use as directed up to 4 times per day as needed for leg rash  30 g  1  . Iron 66 MG TABS Take 1 tablet by mouth every other day.      . metoprolol succinate (TOPROL-XL) 50 MG 24 hr tablet Take 25 mg by mouth daily. Take with or immediately following a meal.      . Multiple Vitamin (MULTIVITAMIN) tablet Take 1 tablet by mouth daily.        . nitroGLYCERIN (NITROSTAT) 0.4 MG SL tablet Place  1 tablet (0.4 mg total) under the tongue every 5 (five) minutes x 3 doses as needed for chest pain.  25 tablet  11  . pantoprazole (PROTONIX) 40 MG tablet Take 1 tablet (40 mg total) by mouth daily.  90 tablet  3  . tacrolimus (PROTOPIC) 0.1 % ointment Apply topically 2 (two) times daily. As needed, thin layer  30 g  1   No current facility-administered medications for this visit.    Allergies:    Allergies  Allergen Reactions  . Azithromycin Other (See Comments)    diarrhea  . Cefuroxime Axetil Other (See Comments)    diarrhea  . Clarithromycin Other (See Comments)    diarrhea  . Hydrocod Polst-Cpm Polst Er Other (See Comments)    Reaction unknown  . Metronidazole Other (See Comments)    Pt had difficulty swallowing this and says it was "horrible" to take. No allergic reaction.  . Rofecoxib      REACTION: unspecified    Social History:  The patient  reports that she has never smoked. She does not have any smokeless tobacco history on file. She reports that she does not drink alcohol or use illicit drugs.   ROS:  Please see the history of present illness.   No melena, hematochezia.   All other systems reviewed and negative.   PHYSICAL EXAM: VS:  BP 110/90  Pulse 81  Ht 5\' 4"  (1.626 m)  Wt 113 lb (51.256 kg)  BMI 19.39 kg/m2 Well nourished, well developed, in no acute distress HEENT: normal Neck: no JVD at 90 degress Cardiac:  normal S1, S2; RRR; no murmur Lungs:  clear to auscultation bilaterally, no wheezing, rhonchi or rales Abd: soft, nontender, no hepatomegaly Ext: no edema Skin: warm and dry Neuro:  CNs 2-12 intact, no focal abnormalities noted  EKG:  NSR, HR 81, normal axis, j point elevation     ASSESSMENT AND PLAN:  1. Atrial Fibrillation:  Maintaining NSR.  We had a long discussion regarding the rationale for anticoagulation for prevention of stroke.  We discussed how ASA is not adequate to prevent stroke with AFib.  We reviewed the relative risk reduction in stroke risk when a patient takes anticoagulants such as coumadin or one of the NOACs.  Her CHADS2-VASc=5.  She would definitely benefit from anticoagulation.  We discussed coumadin as a alternative as it can be reversed with Vitamin K and FFP.  She is not interested in trying coumadin or another NOAC.  She may require anti-arrhythmic drug Rx in the future if she has recurrent AFib.  2. CAD:  No angina.  Continue ASA and statin.  3. Hypertension:  Controlled.  Continue current therapy.  4. Anemia:  Etiology not entirely clear.  Hgb improved. 5. Hyperlipidemia:  Continue statin. 6. Disposition:  F/u with Dr. Loralie Doyle in 3-4 mos.   Signed, Lindsay Dopp, PA-C  10/12/2012 3:09 PM

## 2012-10-13 ENCOUNTER — Encounter: Payer: Medicare Other | Admitting: Oncology

## 2012-10-13 ENCOUNTER — Ambulatory Visit: Payer: Medicare Other

## 2012-10-13 ENCOUNTER — Other Ambulatory Visit: Payer: Medicare Other | Admitting: Lab

## 2012-10-22 ENCOUNTER — Telehealth: Payer: Self-pay | Admitting: *Deleted

## 2012-10-22 DIAGNOSIS — R599 Enlarged lymph nodes, unspecified: Secondary | ICD-10-CM | POA: Diagnosis not present

## 2012-10-22 DIAGNOSIS — H6120 Impacted cerumen, unspecified ear: Secondary | ICD-10-CM | POA: Diagnosis not present

## 2012-10-22 DIAGNOSIS — R05 Cough: Secondary | ICD-10-CM | POA: Diagnosis not present

## 2012-10-22 DIAGNOSIS — J029 Acute pharyngitis, unspecified: Secondary | ICD-10-CM | POA: Diagnosis not present

## 2012-10-22 NOTE — Telephone Encounter (Signed)
Spoke with pt advised of MD note.

## 2012-10-22 NOTE — Telephone Encounter (Signed)
On my review of patient's chart, I do not see a reason or need for Protonix.  Okay to stop same, call if heartburn symptoms or other stomach problems arise with discontinuation of medication

## 2012-10-22 NOTE — Telephone Encounter (Signed)
Pt called requesting when she is to discontinue taking Protonix.  Please advise in Dr Judi Cong absence.

## 2012-10-26 ENCOUNTER — Ambulatory Visit: Payer: Medicare Other | Admitting: Internal Medicine

## 2012-11-01 ENCOUNTER — Other Ambulatory Visit (HOSPITAL_COMMUNITY): Payer: Self-pay | Admitting: Otolaryngology

## 2012-11-01 DIAGNOSIS — R590 Localized enlarged lymph nodes: Secondary | ICD-10-CM

## 2012-11-03 ENCOUNTER — Ambulatory Visit (HOSPITAL_COMMUNITY)
Admission: RE | Admit: 2012-11-03 | Discharge: 2012-11-03 | Disposition: A | Discharge status: Home / Self Care | Payer: Medicare Other | Source: Ambulatory Visit | Attending: Otolaryngology | Admitting: Otolaryngology

## 2012-11-03 DIAGNOSIS — R590 Localized enlarged lymph nodes: Secondary | ICD-10-CM

## 2012-11-03 DIAGNOSIS — R599 Enlarged lymph nodes, unspecified: Secondary | ICD-10-CM | POA: Diagnosis not present

## 2012-11-03 DIAGNOSIS — E041 Nontoxic single thyroid nodule: Secondary | ICD-10-CM | POA: Diagnosis not present

## 2012-11-03 DIAGNOSIS — E042 Nontoxic multinodular goiter: Secondary | ICD-10-CM | POA: Diagnosis not present

## 2012-11-04 DIAGNOSIS — H1030 Unspecified acute conjunctivitis, unspecified eye: Secondary | ICD-10-CM | POA: Diagnosis not present

## 2012-11-04 DIAGNOSIS — H02059 Trichiasis without entropian unspecified eye, unspecified eyelid: Secondary | ICD-10-CM | POA: Diagnosis not present

## 2013-01-12 ENCOUNTER — Ambulatory Visit: Payer: Medicare Other | Admitting: Internal Medicine

## 2013-01-25 ENCOUNTER — Other Ambulatory Visit (INDEPENDENT_AMBULATORY_CARE_PROVIDER_SITE_OTHER): Payer: Medicare Other

## 2013-01-25 ENCOUNTER — Ambulatory Visit (INDEPENDENT_AMBULATORY_CARE_PROVIDER_SITE_OTHER): Payer: Medicare Other | Admitting: Internal Medicine

## 2013-01-25 ENCOUNTER — Encounter: Payer: Self-pay | Admitting: Internal Medicine

## 2013-01-25 VITALS — BP 130/88 | HR 80 | Temp 97.0°F | Ht 64.0 in | Wt 114.5 lb

## 2013-01-25 DIAGNOSIS — E785 Hyperlipidemia, unspecified: Secondary | ICD-10-CM

## 2013-01-25 DIAGNOSIS — R7309 Other abnormal glucose: Secondary | ICD-10-CM

## 2013-01-25 DIAGNOSIS — R7302 Impaired glucose tolerance (oral): Secondary | ICD-10-CM

## 2013-01-25 DIAGNOSIS — D509 Iron deficiency anemia, unspecified: Secondary | ICD-10-CM

## 2013-01-25 DIAGNOSIS — Z23 Encounter for immunization: Secondary | ICD-10-CM | POA: Diagnosis not present

## 2013-01-25 LAB — CBC WITH DIFFERENTIAL/PLATELET
Basophils Relative: 0.5 % (ref 0.0–3.0)
Eosinophils Absolute: 0.1 10*3/uL (ref 0.0–0.7)
HCT: 39.6 % (ref 36.0–46.0)
Hemoglobin: 13.5 g/dL (ref 12.0–15.0)
Lymphocytes Relative: 37.9 % (ref 12.0–46.0)
Lymphs Abs: 2 10*3/uL (ref 0.7–4.0)
MCHC: 34 g/dL (ref 30.0–36.0)
MCV: 96.2 fl (ref 78.0–100.0)
Neutro Abs: 2.7 10*3/uL (ref 1.4–7.7)
RBC: 4.12 Mil/uL (ref 3.87–5.11)

## 2013-01-25 LAB — URINALYSIS, ROUTINE W REFLEX MICROSCOPIC
Ketones, ur: NEGATIVE
Total Protein, Urine: NEGATIVE
Urine Glucose: NEGATIVE
WBC, UA: NONE SEEN (ref 0–?)

## 2013-01-25 LAB — BASIC METABOLIC PANEL
Calcium: 9.4 mg/dL (ref 8.4–10.5)
Creatinine, Ser: 0.9 mg/dL (ref 0.4–1.2)
GFR: 74.15 mL/min (ref >60.00)
Sodium: 137 mEq/L (ref 135–145)

## 2013-01-25 LAB — HEPATIC FUNCTION PANEL
Alkaline Phosphatase: 58 U/L (ref 39–117)
Bilirubin, Direct: 0.1 mg/dL (ref 0.0–0.3)
Total Bilirubin: 0.8 mg/dL (ref 0.3–1.2)

## 2013-01-25 LAB — LIPID PANEL
HDL: 88.2 mg/dL (ref >39.00)
LDL Cholesterol: 95 mg/dL (ref 0–99)
Total CHOL/HDL Ratio: 2
Triglycerides: 68 mg/dL (ref 0.0–149.0)

## 2013-01-25 NOTE — Patient Instructions (Addendum)
You had the flu shot today Please return in 2 weeks for the new prevnar pneumonia shot Please continue all other medications as before, and refills have been done if requested. Please have the pharmacy call with any other refills you may need. Please continue your efforts at being more active, low cholesterol diet, and weight control. You are otherwise up to date with prevention measures today. Please go to the LAB in the Basement (turn left off the elevator) for the tests to be done today You will be contacted by phone if any changes need to be made immediately.  Otherwise, you will receive a letter about your results with an explanation, but please check with MyChart first. OK also to use Capsaicin OTC for the hand arthritis  Please return in 6 months, or sooner if needed

## 2013-01-25 NOTE — Progress Notes (Signed)
Subjective:    Patient ID: Lindsay Doyle, female    DOB: 1933-09-25, 79 y.o.   MRN: [id number]  HPI  Here to f/u; overall doing ok,  Pt denies chest pain, increased sob or doe, wheezing, orthopnea, PND, increased LE swelling, palpitations, dizziness or syncope.  Pt denies polydipsia, polyuria, or low sugar symptoms such as weakness or confusion improved with po intake.  Pt denies new neurological symptoms such as new headache, or facial or extremity weakness or numbness.   Pt states overall good compliance with meds, has been trying to follow lower cholesterol diet, with wt overall stable,  but little exercise however. C/o taking tylenol otc for left neck pain with soreness to the upper back to the shoulder, no other left shoulder pain or radicualr pain or weakness. Also helps the hand OA pain.  Sees Dr Candie Mile for [place] and mammogram. Due for flu shot and prevnar.  No other complaints.  Saw card in June with good lipids. No overt bleeding or bruising Past Medical History  Diagnosis Date  . HYPERCHOLESTEROLEMIA 09/17/2009  . MITRAL VALVE PROLAPSE 03/09/2007    Remotely - last echo 2011 did not show this  . ANEMIA-IRON DEFICIENCY 03/09/2007  . LEUKOPENIA, MILD 05/30/2008  . HYPERTENSION 11/12/2006  . COPD 03/09/2007    ? pt is unsure  . OSTEOARTHRITIS 02/19/2009    Spinal  . OSTEOPOROSIS 11/12/2006  . COLONIC POLYPS, HX OF 03/09/2007  . HYPERGLYCEMIA 07/02/2007  . ASYMPTOMATIC POSTMENOPAUSAL STATUS 07/17/2008  . PSVT     a. Remotely, in setting of anemia.  [name] Positive H. pylori test 10/1996  . Cataract   . Blood transfusion     a. 05/2010: 3 transfusions for anemia/GIB.  [name] GI bleed     a. 05/2010: Hgb 7 at outside hospital s/p 3 u PRBC, EGD 05/2010 mild gastritis, small hiatal hernia, Schatzki ring, negative colonoscopy; capsule endoscopy 06/2010 - possible nonspecific inflammatory changes.  [name] Hx of echocardiogram     a. echo 2/11:  EF 55-60%, mild AI, mild RAE, mild to mod TR  . NSTEMI (non-ST  elevated myocardial infarction) 03/2012    a. Type II in setting of AF with RVR 03/2012  . Atrial fibrillation     a. Apixaban started 03/2012  . CAD (coronary artery disease)     a. LHC 1/14:  mLAD serial 30 and 60%, oRCA 30%, EF 60%  . Hx of echocardiogram     a. Echo 2/14:  Mild focal basal and mild LVH, EF 60-65%, mild AI, mild RAE, trivia effusion  . Spinal stenosis of lumbar region 07/16/2012   Past Surgical History  Procedure Laterality Date  . Abdominal hysterectomy    . Tonsillectomy    . Tubal ligation    . Cataract extraction    . Cardiac catheterization  1992    S/P in 1992 at [facility] in [place]. This was negative for any coronary artery disease  . Carotid duplex  08/29/2003  . Ceasarean      reports that she has never smoked. She does not have any smokeless tobacco history on file. She reports that she does not drink alcohol or use illicit drugs. family history includes Cancer in her brother and mother; Sarcoidosis in her other and sister. There is no history of CAD. Allergies  Allergen Reactions  . Azithromycin Other (See Comments)    diarrhea  . Cefuroxime Axetil Other (See Comments)    diarrhea  . Clarithromycin Other (  See Comments)    diarrhea  . Hydrocod Polst-Cpm Polst Er Other (See Comments)    Reaction unknown  . Metronidazole Other (See Comments)    Pt had difficulty swallowing this and says it was "horrible" to take. No allergic reaction.  . Rofecoxib     REACTION: unspecified   Current Outpatient Prescriptions on File Prior to Visit  Medication Sig Dispense Refill  . acetaminophen (TYLENOL) 500 MG tablet Take 1,000 mg by mouth every 6 (six) hours as needed. For pain/fever      . aspirin EC 81 MG tablet Take 1 tablet (81 mg total) by mouth daily.      [name] atorvastatin (LIPITOR) 20 MG tablet Take 1 tablet (20 mg total) by mouth daily at 6 PM.  90 tablet  3  . b complex vitamins tablet Take 1 tablet by mouth 2 (two) times a week.        . Calcium Carbonate-Vitamin D (CALTRATE 600+D) 600-400 MG-UNIT per tablet Take 1 tablet by mouth daily.        . fluticasone (CUTIVATE) 0.05 % cream Use as directed up to 4 times per day as needed for leg rash  30 g  1  . Iron 66 MG TABS Take 1 tablet by mouth every other day.      . metoprolol succinate (TOPROL-XL) 50 MG 24 hr tablet Take 25 mg by mouth daily. Take with or immediately following a meal.      . Multiple Vitamin (MULTIVITAMIN) tablet Take 1 tablet by mouth daily.        . nitroGLYCERIN (NITROSTAT) 0.4 MG SL tablet Place 1 tablet (0.4 mg total) under the tongue every 5 (five) minutes x 3 doses as needed for chest pain.  25 tablet  11  . tacrolimus (PROTOPIC) 0.1 % ointment Apply topically 2 (two) times daily. As needed, thin layer  30 g  1   No current facility-administered medications on file prior to visit.     Review of Systems  Constitutional: Negative for unexpected weight change, or unusual diaphoresis  HENT: Negative for tinnitus.   Eyes: Negative for photophobia and visual disturbance.  Respiratory: Negative for choking and stridor.   Gastrointestinal: Negative for vomiting and blood in stool.  Genitourinary: Negative for hematuria and decreased urine volume.  Musculoskeletal: Negative for acute joint swelling Skin: Negative for color change and wound.  Neurological: Negative for tremors and numbness other than noted  Psychiatric/Behavioral: Negative for decreased concentration or  hyperactivity.       Objective:   Physical Exam BP 130/88  Pulse 80  Temp(Src) 97 F (36.1 C) (Oral)  Ht 5\' 4"  (1.626 m)  Wt 114 lb 8 oz (51.937 kg)  BMI 19.64 kg/m2  SpO2 99% VS noted,  Constitutional: Pt appears well-developed and well-nourished.  HENT: Head: NCAT.  Right Ear: External ear normal.  Left Ear: External ear normal.  Eyes: Conjunctivae and EOM are normal. Pupils are equal, round, and reactive to light.  Neck: Normal range of motion. Neck supple.   Cardiovascular: Normal rate and regular rhythm.   Pulmonary/Chest: Effort normal and breath sounds normal.  Abd:  Soft, NT, non-distended, + BS Neurological: Pt is alert. Not confused  Skin: Skin is warm. No erythema.  Psychiatric: Pt behavior is normal. Thought content normal.  Hand with typical OA changes to PIP and DIPs    Assessment & Plan:

## 2013-01-25 NOTE — Assessment & Plan Note (Signed)
For f/u today iron labs

## 2013-01-25 NOTE — Assessment & Plan Note (Signed)
stable overall by history and exam, recent data reviewed with pt, and pt to continue medical treatment as before,  to f/u any worsening symptoms or concerns Lab Results  Component Value Date   [place] 68 09/14/2012   For f/u lbas today

## 2013-01-25 NOTE — Assessment & Plan Note (Signed)
Asympt, for a1c today 

## 2013-01-25 NOTE — Assessment & Plan Note (Signed)
Quite stable recently with last cr improved, f/u lab Lab Results  Component Value Date   WBC 9.2 09/20/2012   HGB 11.8* 09/20/2012   HCT 34.6* 09/20/2012   PLT 170 09/20/2012   GLUCOSE 95 09/18/2012   GLUCOSE 96 09/18/2012   CHOL 158 09/14/2012   TRIG 90.0 09/14/2012   HDL 71.70 09/14/2012   LDLDIRECT 223.7 01/08/2012   LDLCALC 68 09/14/2012   ALT 13 09/18/2012   AST 21 09/18/2012   NA 143 09/18/2012   NA 144 09/18/2012   K 3.8 09/18/2012   K 3.7 09/18/2012   CL 107 09/18/2012   CL 107 09/18/2012   CREATININE 0.96 09/18/2012   CREATININE 0.93 09/18/2012   BUN 8 09/18/2012   BUN 8 09/18/2012   CO2 28 09/18/2012   CO2 27 09/18/2012   TSH 1.58 09/14/2012   INR 1.00 09/17/2012   HGBA1C 5.7 09/14/2012

## 2013-02-08 ENCOUNTER — Ambulatory Visit (INDEPENDENT_AMBULATORY_CARE_PROVIDER_SITE_OTHER): Payer: Medicare Other

## 2013-02-08 DIAGNOSIS — Z23 Encounter for immunization: Secondary | ICD-10-CM

## 2013-02-09 ENCOUNTER — Encounter: Payer: Self-pay | Admitting: Cardiology

## 2013-02-09 ENCOUNTER — Ambulatory Visit (INDEPENDENT_AMBULATORY_CARE_PROVIDER_SITE_OTHER): Payer: Medicare Other | Admitting: Cardiology

## 2013-02-09 VITALS — BP 140/88 | HR 89 | Ht 64.0 in | Wt 114.0 lb

## 2013-02-09 DIAGNOSIS — I4891 Unspecified atrial fibrillation: Secondary | ICD-10-CM | POA: Diagnosis not present

## 2013-02-09 DIAGNOSIS — I2510 Atherosclerotic heart disease of native coronary artery without angina pectoris: Secondary | ICD-10-CM

## 2013-02-09 DIAGNOSIS — E785 Hyperlipidemia, unspecified: Secondary | ICD-10-CM | POA: Diagnosis not present

## 2013-02-09 DIAGNOSIS — I10 Essential (primary) hypertension: Secondary | ICD-10-CM

## 2013-02-09 NOTE — Patient Instructions (Signed)
Your physician wants you to follow-up in: 6 months with Dr [name]. (May 2015). You will receive a reminder letter in the mail two months in advance. If you don't receive a letter, please call our office to schedule the follow-up appointment.

## 2013-02-09 NOTE — Progress Notes (Signed)
Patient ID: Lindsay Doyle, female   DOB: [birth year], 79 y.o.   MRN: [id number] PCP: Dr. Jenny Reichmann  79 yo with history of paroxysmal atrial fibrillation presents for cardiology followup.  She was admitted in 1/14 with NSTEMI in the setting of AFib with RVR. She spontaneously converted to NSR. LHC demonstrated non-obstructive disease and she was felt to have had demand ischemia. Eliquis 2.5 mg bid was added due to stroke risk. Echo 05/20/12: EF 60-65%.  Lindsay Doyle was admitted in 6/14 to [facility] with anemia.  1/2 stool guaiacs positive.  She required transfusion. Eliquis was stopped due to concern that it triggered GI bleeding.  No scopes were done. She saw [name] back in followup and had a long discussion about anticoagulation.  Ultimately, she decided not to restart anticoagulation.    Recently, she has been stable.  She is in NSR today.  No significant tachypalpitations.  Weight is stable.  She exercises at the [facility] with weights and light aerobics.  No chest pain or exertional dyspnea.  No melena or BRBPR.      Labs (3/14): K 4.2, creatinine 1.0, LDL 84, HDL 82 Labs (10/14): K 3.9, creatinine 0.9, HCT 39.6, TSH normal, LDL 95, HDL 88  PMH: 1. Hyperlipidemia 2. Anemia: Fe deficiency.  Presumed GI bleed requiring transfusion in 6/14, anticoagulation stopped.  3. HTN  4. Osteoarthritis 5. H/o colonic polyps 6. Osteoporosis 7. GI bleed (3/12, 6/14): 3 units PRBCs, EGD with mild gastritis, colonoscopy negative, capsule endoscopy with nonspecific inflammatory changes.  6/14 GI bleeding (1/2 guaiac +), required transfusion.  8. CAD: LHC (1/14) with serial 30% and 60% LAD stenoses, 30% ostial RCA, EF 60%.  9. Paroxysmal atrial fibrillation.  Echo (2/14) with EF 60-65%, mild focal basal septal hypertrophy, mild AI.  SH: Married, lives with husband in [place].  Has children living out of town.  Nonsmoker.  Retired.   FH: CAD  Current Outpatient Prescriptions  Medication Sig Dispense  Refill  . acetaminophen (TYLENOL) 500 MG tablet Take 1,000 mg by mouth every 6 (six) hours as needed. For pain/fever      . aspirin EC 81 MG tablet Take 1 tablet (81 mg total) by mouth daily.      [name] atorvastatin (LIPITOR) 20 MG tablet Take 1 tablet (20 mg total) by mouth daily at 6 PM.  90 tablet  3  . b complex vitamins tablet Take 1 tablet by mouth 2 (two) times a week.       . Calcium Carbonate-Vitamin D (CALTRATE 600+D) 600-400 MG-UNIT per tablet Take 1 tablet by mouth daily.        . fluticasone (CUTIVATE) 0.05 % cream Use as directed up to 4 times per day as needed for leg rash  30 g  1  . Iron 66 MG TABS Take 1 tablet by mouth every other day.      . metoprolol succinate (TOPROL-XL) 50 MG 24 hr tablet Take 25 mg by mouth daily. Take with or immediately following a meal.      . Multiple Vitamin (MULTIVITAMIN) tablet Take 1 tablet by mouth daily.        . nitroGLYCERIN (NITROSTAT) 0.4 MG SL tablet Place 1 tablet (0.4 mg total) under the tongue every 5 (five) minutes x 3 doses as needed for chest pain.  25 tablet  11  . tacrolimus (PROTOPIC) 0.1 % ointment Apply topically 2 (two) times daily. As needed, thin layer  30 g  1   No  current facility-administered medications for this visit.    BP 140/88  Pulse 89  Ht 5\' 4"  (1.626 m)  Wt 114 lb (51.71 kg)  BMI 19.56 kg/m2 General: NAD Neck: No JVD, no thyromegaly or thyroid nodule.  Lungs: Clear to auscultation bilaterally with normal respiratory effort. CV: Nondisplaced PMI.  Heart regular S1/S2, no S3/S4, no murmur.  No peripheral edema.  No carotid bruit.  Normal pedal pulses.  Abdomen: Soft, nontender, no hepatosplenomegaly, no distention.  Neurologic: Alert and oriented x 3.  Psych: Normal affect. Extremities: No clubbing or cyanosis.   Assessment/Plan:  1. Paroxysmal atrial fibrillation: No recent tachypalpitations.  She is in NSR today.  Continue Toprol XL.  Given her recurrent GI bleeding episodes of uncertain etiology (and  requiring transfusion), will hold off on anticoagulation.  2. CAD: Nonobstructive.  No chest pain.  Suspect elevated troponin in 1/14 episode was due to demand ischemia from atrial fibrillation with RVR.  Continue statin and ASA 81.   3. HTN: BP control has been reasonable.   Loralie Champagne 02/09/2013

## 2013-02-14 ENCOUNTER — Telehealth: Payer: Self-pay | Admitting: *Deleted

## 2013-02-14 NOTE — Telephone Encounter (Signed)
Pt called states she is having arthritic pain radiating from her shoulder to her head.  Please advise

## 2013-02-14 NOTE — Telephone Encounter (Signed)
Please consider OV as this could be nerve pain related to the arthritis

## 2013-02-15 ENCOUNTER — Ambulatory Visit (INDEPENDENT_AMBULATORY_CARE_PROVIDER_SITE_OTHER): Payer: Medicare Other | Admitting: Internal Medicine

## 2013-02-15 ENCOUNTER — Encounter: Payer: Self-pay | Admitting: Internal Medicine

## 2013-02-15 VITALS — BP 160/100 | HR 91 | Temp 97.0°F | Ht 64.0 in | Wt 115.2 lb

## 2013-02-15 DIAGNOSIS — R7302 Impaired glucose tolerance (oral): Secondary | ICD-10-CM

## 2013-02-15 DIAGNOSIS — R7309 Other abnormal glucose: Secondary | ICD-10-CM

## 2013-02-15 DIAGNOSIS — I10 Essential (primary) hypertension: Secondary | ICD-10-CM

## 2013-02-15 DIAGNOSIS — M542 Cervicalgia: Secondary | ICD-10-CM | POA: Insufficient documentation

## 2013-02-15 MED ORDER — CYCLOBENZAPRINE HCL 5 MG PO TABS: 5.0000 mg | ORAL_TABLET | Freq: Three times a day (TID) | ORAL | Status: DC | PRN

## 2013-02-15 MED ORDER — NABUMETONE 500 MG PO TABS: 500.0000 mg | ORAL_TABLET | Freq: Two times a day (BID) | ORAL | Status: DC | PRN

## 2013-02-15 NOTE — Assessment & Plan Note (Signed)
stable overall by history and exam, recent data reviewed with pt, and pt to continue medical treatment as before,  to f/u any worsening symptoms or concerns Lab Results  Component Value Date   HGBA1C 6.2 01/25/2013

## 2013-02-15 NOTE — Patient Instructions (Signed)
Please take all new medication as prescribed  - the anti-inflammatory, and muscle relaxer Please continue all other medications as before, and refills have been done if requested.

## 2013-02-15 NOTE — Assessment & Plan Note (Signed)
Mild elev today liekly situational, but stable overall by history and exam, recent data reviewed with pt, and pt to continue medical treatment as before,  to f/u any worsening symptoms or concerns BP Readings from Last 3 Encounters:  02/15/13 160/100  02/09/13 140/88  01/25/13 130/88

## 2013-02-15 NOTE — Progress Notes (Addendum)
Subjective:    Patient ID: Lindsay Doyle, female    DOB: 07-05-1933, 79 y.o.   MRN: [id number]  HPI Here with c/o 2-3 wks onset recurrent mild to mod left post neck pain with some radiation and tenderness towards the left shoulder and left occiput, can make pain flare with turning head obliquely to the right, no other UE pain/weakness or numbness, and no red/ swelling.rash.  Pt denies polydipsia, polyuria, Pt denies chest pain, increased sob or doe, wheezing, orthopnea, PND, increased LE swelling, palpitations, dizziness or syncope.  No worsening pain with left shoudler movement  Past Medical History  Diagnosis Date  . HYPERCHOLESTEROLEMIA 09/17/2009  . MITRAL VALVE PROLAPSE 03/09/2007    Remotely - last echo 2011 did not show this  . ANEMIA-IRON DEFICIENCY 03/09/2007  . LEUKOPENIA, MILD 05/30/2008  . HYPERTENSION 11/12/2006  . COPD 03/09/2007    ? pt is unsure  . OSTEOARTHRITIS 02/19/2009    Spinal  . OSTEOPOROSIS 11/12/2006  . COLONIC POLYPS, HX OF 03/09/2007  . HYPERGLYCEMIA 07/02/2007  . ASYMPTOMATIC POSTMENOPAUSAL STATUS 07/17/2008  . PSVT     a. Remotely, in setting of anemia.  [name] Positive H. pylori test 10/1996  . Cataract   . Blood transfusion     a. 05/2010: 3 transfusions for anemia/GIB.  [name] GI bleed     a. 05/2010: Hgb 7 at outside hospital s/p 3 u PRBC, EGD 05/2010 mild gastritis, small hiatal hernia, Schatzki ring, negative colonoscopy; capsule endoscopy 06/2010 - possible nonspecific inflammatory changes.  [name] Hx of echocardiogram     a. echo 2/11:  EF 55-60%, mild AI, mild RAE, mild to mod TR  . NSTEMI (non-ST elevated myocardial infarction) 03/2012    a. Type II in setting of AF with RVR 03/2012  . Atrial fibrillation     a. Apixaban started 03/2012  . CAD (coronary artery disease)     a. LHC 1/14:  mLAD serial 30 and 60%, oRCA 30%, EF 60%  . Hx of echocardiogram     a. Echo 2/14:  Mild focal basal and mild LVH, EF 60-65%, mild AI, mild RAE, trivia effusion  . Spinal stenosis of  lumbar region 07/16/2012   Past Surgical History  Procedure Laterality Date  . Abdominal hysterectomy    . Tonsillectomy    . Tubal ligation    . Cataract extraction    . Cardiac catheterization  1992    S/P in 1992 at [facility] in [place]. This was negative for any coronary artery disease  . Carotid duplex  08/29/2003  . Ceasarean      reports that she has never smoked. She does not have any smokeless tobacco history on file. She reports that she does not drink alcohol or use illicit drugs. family history includes Cancer in her brother and mother; Sarcoidosis in her other and sister. There is no history of CAD. Allergies  Allergen Reactions  . Azithromycin Other (See Comments)    diarrhea  . Cefuroxime Axetil Other (See Comments)    diarrhea  . Clarithromycin Other (See Comments)    diarrhea  . Hydrocod Polst-Cpm Polst Er Other (See Comments)    Reaction unknown  . Metronidazole Other (See Comments)    Pt had difficulty swallowing this and says it was "horrible" to take. No allergic reaction.  . Rofecoxib     REACTION: unspecified   Current Outpatient Prescriptions on File Prior to Visit  Medication Sig Dispense Refill  . acetaminophen (TYLENOL)  500 MG tablet Take 1,000 mg by mouth every 6 (six) hours as needed. For pain/fever      . aspirin EC 81 MG tablet Take 1 tablet (81 mg total) by mouth daily.      [name] atorvastatin (LIPITOR) 20 MG tablet Take 1 tablet (20 mg total) by mouth daily at 6 PM.  90 tablet  3  . b complex vitamins tablet Take 1 tablet by mouth 2 (two) times a week.       . Calcium Carbonate-Vitamin D (CALTRATE 600+D) 600-400 MG-UNIT per tablet Take 1 tablet by mouth daily.        . fluticasone (CUTIVATE) 0.05 % cream Use as directed up to 4 times per day as needed for leg rash  30 g  1  . Iron 66 MG TABS Take 1 tablet by mouth every other day.      . metoprolol succinate (TOPROL-XL) 50 MG 24 hr tablet Take 25 mg by mouth daily. Take with  or immediately following a meal.      . Multiple Vitamin (MULTIVITAMIN) tablet Take 1 tablet by mouth daily.        . nitroGLYCERIN (NITROSTAT) 0.4 MG SL tablet Place 1 tablet (0.4 mg total) under the tongue every 5 (five) minutes x 3 doses as needed for chest pain.  25 tablet  11  . tacrolimus (PROTOPIC) 0.1 % ointment Apply topically 2 (two) times daily. As needed, thin layer  30 g  1   No current facility-administered medications on file prior to visit.   Review of Systems  Constitutional: Negative for unexpected weight change, or unusual diaphoresis  HENT: Negative for tinnitus.   Eyes: Negative for photophobia and visual disturbance.  Respiratory: Negative for choking and stridor.   Gastrointestinal: Negative for vomiting and blood in stool.  Genitourinary: Negative for hematuria and decreased urine volume.  Musculoskeletal: Negative for acute joint swelling Skin: Negative for color change and wound.  Neurological: Negative for tremors and numbness other than noted  Psychiatric/Behavioral: Negative for decreased concentration or  hyperactivity.       Objective:   Physical Exam BP 160/100  Pulse 91  Temp(Src) 97 F (36.1 C) (Oral)  Ht 5\' 4"  (1.626 m)  Wt 115 lb 4 oz (52.277 kg)  BMI 19.77 kg/m2  SpO2 95% VS noted,  Constitutional: Pt appears well-developed and well-nourished.  HENT: Head: NCAT.  Right Ear: External ear normal.  Left Ear: External ear normal.  Eyes: Conjunctivae and EOM are normal. Pupils are equal, round, and reactive to light.  Neck: Normal range of motion. Neck supple. with mild tender to left post lat area and trapezoid and left occiput without red/swelling Cardiovascular: Normal rate and regular rhythm.   Pulmonary/Chest: Effort normal and breath sounds normal.  Abd:  Soft, NT, non-distended, + BS Neurological: Pt is alert. Not confused , motor 5/5,  Skin: Skin is warm. No erythema.  Psychiatric: Pt behavior is normal. Thought content normal.      Assessment & Plan:  Quality Measures addressed:   ACE/ARB therapy for CAD, Diabetes, and/or LVSD: pt declines further medication

## 2013-02-15 NOTE — Telephone Encounter (Signed)
Patient informed. 

## 2013-02-15 NOTE — Assessment & Plan Note (Signed)
?   MSK vs radiculitis, for nsaid/muscle relaxer prn,  to f/u any worsening symptoms or concerns

## 2013-02-16 ENCOUNTER — Telehealth: Payer: Self-pay | Admitting: *Deleted

## 2013-02-16 NOTE — Telephone Encounter (Signed)
Pt called states due to her h/o A Fib and being a bleeder she does not feel comfortable taking the Flexeril and Relafen.  She states she will continue to take Tylenol.  Please advise

## 2013-02-28 NOTE — Telephone Encounter (Signed)
Ok with me, but should limit intake to 3000 mg per day total

## 2013-03-01 NOTE — Telephone Encounter (Signed)
Spoke with pt advised of MDs message 

## 2013-03-08 ENCOUNTER — Ambulatory Visit: Payer: Medicare Other | Admitting: Internal Medicine

## 2013-03-11 ENCOUNTER — Ambulatory Visit (INDEPENDENT_AMBULATORY_CARE_PROVIDER_SITE_OTHER): Payer: Medicare Other | Admitting: Internal Medicine

## 2013-03-11 ENCOUNTER — Encounter: Payer: Self-pay | Admitting: Family Medicine

## 2013-03-11 ENCOUNTER — Ambulatory Visit (INDEPENDENT_AMBULATORY_CARE_PROVIDER_SITE_OTHER): Payer: Medicare Other | Admitting: Family Medicine

## 2013-03-11 ENCOUNTER — Ambulatory Visit (INDEPENDENT_AMBULATORY_CARE_PROVIDER_SITE_OTHER)
Admission: RE | Admit: 2013-03-11 | Discharge: 2013-03-11 | Disposition: A | Discharge status: Home / Self Care | Payer: Medicare Other | Source: Ambulatory Visit | Attending: Family Medicine | Admitting: Family Medicine

## 2013-03-11 ENCOUNTER — Encounter: Payer: Self-pay | Admitting: Internal Medicine

## 2013-03-11 VITALS — BP 130/80 | HR 92 | Temp 99.2°F | Ht 64.0 in | Wt 112.0 lb

## 2013-03-11 DIAGNOSIS — M542 Cervicalgia: Secondary | ICD-10-CM

## 2013-03-11 DIAGNOSIS — I4891 Unspecified atrial fibrillation: Secondary | ICD-10-CM

## 2013-03-11 DIAGNOSIS — I10 Essential (primary) hypertension: Secondary | ICD-10-CM

## 2013-03-11 DIAGNOSIS — M5030 Other cervical disc degeneration, unspecified cervical region: Secondary | ICD-10-CM | POA: Diagnosis not present

## 2013-03-11 DIAGNOSIS — M47812 Spondylosis without myelopathy or radiculopathy, cervical region: Secondary | ICD-10-CM | POA: Diagnosis not present

## 2013-03-11 MED ORDER — TRAMADOL HCL ER 100 MG PO TB24: 100.0000 mg | ORAL_TABLET | Freq: Every day | ORAL | Status: DC

## 2013-03-11 NOTE — Assessment & Plan Note (Signed)
Patient does have cervical neck arthritis. This appears to be the likely cause the patient's pain. Patient shoulder appears to be unremarkable. Patient was given home exercise program for range of motion exercises of the neck as well as postural training. Patient told to try over-the-counter medications that would be beneficial and C4 all the patient's medications in problems. Patient will followup again in 3-4 weeks to make sure she continues to improve. If she continues to have pain I would consider sending to formal physical therapy.

## 2013-03-11 NOTE — Progress Notes (Signed)
Subjective:    Patient ID: Lindsay Doyle, female    DOB: [birth year], 79 y.o.   MRN: [id number]  HPI   Here to f/u, has been afraid of taking tylenol due to risk of liver damage on the bottle information and internet, takes care of her husband, and 2 of the 500 mg tylenol per day does not seem to be working, still with left shoudler/left neck pain without other radiation, numb or weakness. No fever, or HA.  Pt denies chest pain, increased sob or doe, wheezing, orthopnea, PND, increased LE swelling, palpitations, dizziness or syncope.  Pt denies new neurological symptoms such as new headache, or facial or extremity weakness or numbness  . Denies worsening depressive symptoms, suicidal ideation, or panic; has increased stress due to husband just post hospn.  Very worried any med she might take would exac her afib or otherwise make her unable to support her husband.  Had a short bout of afib last PM, now asympt Past Medical History  Diagnosis Date  . HYPERCHOLESTEROLEMIA 09/17/2009  . MITRAL VALVE PROLAPSE 03/09/2007    Remotely - last echo 2011 did not show this  . ANEMIA-IRON DEFICIENCY 03/09/2007  . LEUKOPENIA, MILD 05/30/2008  . HYPERTENSION 11/12/2006  . COPD 03/09/2007    ? pt is unsure  . OSTEOARTHRITIS 02/19/2009    Spinal  . OSTEOPOROSIS 11/12/2006  . COLONIC POLYPS, HX OF 03/09/2007  . HYPERGLYCEMIA 07/02/2007  . ASYMPTOMATIC POSTMENOPAUSAL STATUS 07/17/2008  . PSVT     a. Remotely, in setting of anemia.  [name] Positive H. pylori test 10/1996  . Cataract   . Blood transfusion     a. 05/2010: 3 transfusions for anemia/GIB.  [name] GI bleed     a. 05/2010: Hgb 7 at outside hospital s/p 3 u PRBC, EGD 05/2010 mild gastritis, small hiatal hernia, Schatzki ring, negative colonoscopy; capsule endoscopy 06/2010 - possible nonspecific inflammatory changes.  [name] Hx of echocardiogram     a. echo 2/11:  EF 55-60%, mild AI, mild RAE, mild to mod TR  . NSTEMI (non-ST elevated myocardial infarction) 03/2012    a.  Type II in setting of AF with RVR 03/2012  . Atrial fibrillation     a. Apixaban started 03/2012  . CAD (coronary artery disease)     a. LHC 1/14:  mLAD serial 30 and 60%, oRCA 30%, EF 60%  . Hx of echocardiogram     a. Echo 2/14:  Mild focal basal and mild LVH, EF 60-65%, mild AI, mild RAE, trivia effusion  . Spinal stenosis of lumbar region 07/16/2012   Past Surgical History  Procedure Laterality Date  . Abdominal hysterectomy    . Tonsillectomy    . Tubal ligation    . Cataract extraction    . Cardiac catheterization  1992    S/P in 1992 at [facility] in [place]. This was negative for any coronary artery disease  . Carotid duplex  08/29/2003  . Ceasarean      reports that she has never smoked. She does not have any smokeless tobacco history on file. She reports that she does not drink alcohol or use illicit drugs. family history includes Cancer in her brother and mother; Sarcoidosis in her other and sister. There is no history of CAD. Allergies  Allergen Reactions  . Azithromycin Other (See Comments)    diarrhea  . Cefuroxime Axetil Other (See Comments)    diarrhea  . Clarithromycin Other (See Comments)  diarrhea  . Hydrocod Polst-Cpm Polst Er Other (See Comments)    Reaction unknown  . Metronidazole Other (See Comments)    Pt had difficulty swallowing this and says it was "horrible" to take. No allergic reaction.  . Rofecoxib     REACTION: unspecified   Current Outpatient Prescriptions on File Prior to Visit  Medication Sig Dispense Refill  . acetaminophen (TYLENOL) 500 MG tablet Take 1,000 mg by mouth every 6 (six) hours as needed. For pain/fever      . aspirin EC 81 MG tablet Take 1 tablet (81 mg total) by mouth daily.      [name] atorvastatin (LIPITOR) 20 MG tablet Take 1 tablet (20 mg total) by mouth daily at 6 PM.  90 tablet  3  . b complex vitamins tablet Take 1 tablet by mouth 2 (two) times a week.       . Calcium Carbonate-Vitamin D (CALTRATE  600+D) 600-400 MG-UNIT per tablet Take 1 tablet by mouth daily.        . fluticasone (CUTIVATE) 0.05 % cream Use as directed up to 4 times per day as needed for leg rash  30 g  1  . Iron 66 MG TABS Take 1 tablet by mouth every other day.      . metoprolol succinate (TOPROL-XL) 50 MG 24 hr tablet Take 25 mg by mouth daily. Take with or immediately following a meal.      . Multiple Vitamin (MULTIVITAMIN) tablet Take 1 tablet by mouth daily.        . nitroGLYCERIN (NITROSTAT) 0.4 MG SL tablet Place 1 tablet (0.4 mg total) under the tongue every 5 (five) minutes x 3 doses as needed for chest pain.  25 tablet  11  . tacrolimus (PROTOPIC) 0.1 % ointment Apply topically 2 (two) times daily. As needed, thin layer  30 g  1   No current facility-administered medications on file prior to visit.   Review of Systems  Constitutional: Negative for unexpected weight change, or unusual diaphoresis  HENT: Negative for tinnitus.   Eyes: Negative for photophobia and visual disturbance.  Respiratory: Negative for choking and stridor.   Gastrointestinal: Negative for vomiting and blood in stool.  Genitourinary: Negative for hematuria and decreased urine volume.  Musculoskeletal: Negative for acute joint swelling Skin: Negative for color change and wound.  Neurological: Negative for tremors and numbness other than noted  Psychiatric/Behavioral: Negative for decreased concentration or  hyperactivity.       Objective:   Physical Exam BP 130/80  Pulse 92  Temp(Src) 99.2 F (37.3 C) (Oral)  Ht 5\' 4"  (1.626 m)  Wt 112 lb (50.803 kg)  BMI 19.22 kg/m2  SpO2 97% VS noted, not ill appearing Constitutional: Pt appears well-developed and well-nourished.  HENT: Head: NCAT.  Right Ear: External ear normal.  Left Ear: External ear normal.  Eyes: Conjunctivae and EOM are normal. Pupils are equal, round, and reactive to light.  Neck: Normal range of motion. Neck supple.  Cardiovascular: Normal rate and regular  rhythm.   Pulmonary/Chest: Effort normal and breath sounds normal.  Abd:  Soft, NT, non-distended, + BS Neurological: Pt is alert. Not confused , motor 5/5 Skin: Skin is warm. No erythema.  Psychiatric: Pt behavior is normal. Thought content normal. 1-2+ nervous Left post lat neck with mild tender, no rash, swelling    Assessment & Plan:

## 2013-03-11 NOTE — Patient Instructions (Signed)
Very nice to meet you Try the exercises most days of the week Want you to stand on wall for 5 minutes a day with heel, butt, shoulders and head against wall.  Start at 30 second intervals and increase.  Take tylenol 500mg  three times a day is the best evidence based medicine we have for arthritis.  Glucosamine sulfate 750mg  twice a day is a supplement that has been shown to help moderate to severe arthritis. Vitamin D 2000 IU daily Fish oil 2 grams daily.  Tumeric 500mg  twice daily.  Capsaicin topically up to four times a day may also help with pain. Consider physical therapy to strengthen muscles around the joint that hurts to take pressure off of the joint itself.  Water aerobics and cycling with low resistance are the best two types of exercise for arthritis. Lets get xrays downstairs.  Come back and see me in 4-6 weeks.

## 2013-03-11 NOTE — Patient Instructions (Signed)
Please take all new medication as prescribed - the once per day generic tramadol, if the tylenol up to 3000 mg per day does not help enough Please continue all other medications as before, and refills have been done if requested. Please have the pharmacy call with any other refills you may need.  Consider dental with Dr Arita Miss, DDS - [place], [place]  You can see Dr Smith/sports medicine now for the shoulder and neck pain

## 2013-03-11 NOTE — Assessment & Plan Note (Signed)
stable overall by history and exam, recent data reviewed with pt, and pt to continue medical treatment as before,  to f/u any worsening symptoms or concerns BP 130/80  Pulse 92  Temp(Src) 99.2 F (37.3 C) (Oral)  Ht 5\' 4"  (1.626 m)  Wt 112 lb (50.803 kg)  BMI 19.22 kg/m2  SpO2 97%

## 2013-03-11 NOTE — Progress Notes (Signed)
  I'm seeing this patient by the request  of:  Cathlean Cower, MD  CC: Shoulder pain  HPI: Patient is a 79 year old female with a past medical history significant for osteoarthritis and osteoporosis who is the primary caregiver for her ailing husband. Patient states she has had left shoulder pain for quite some time. Patient states that seems to radiate up to the left side of her neck. Patient states it seems to stay fairly localized top of the shoulder blade. Patient denies any radiation of the arm or any numbness. Patient also denies any redness. Patient states it is more of a dull throbbing aching pain that can be worse with patient also notices if she tries to help care for her husband such as transitioning him it makes it very difficult. movement of her neck. patient rates the pain a 7/10 has not tried any over-the-counter medications because she is scared that he might have side effects.    Past medical, surgical, family and social history reviewed. Medications reviewed all in the electronic medical record.   Review of Systems: No headache, visual changes, nausea, vomiting, diarrhea, constipation, dizziness, abdominal pain, skin rash, fevers, chills, night sweats, weight loss, swollen lymph nodes, body aches, joint swelling, muscle aches, chest pain, shortness of breath, mood changes.   Objective:    Blood pressure 130/80, pulse 92, temperature 99.2 F (37.3 C), temperature source Oral, height 5\' 4"  (1.626 m), weight 112 lb (50.803 kg), SpO2 97.00%.   General: No apparent distress alert and oriented x3 mood and affect normal, dressed appropriately. mildly cachectic  HEENT: Pupils equal, extraocular movements intact Respiratory: Patient's speak in full sentences and does not appear short of breath Cardiovascular: No lower extremity edema, non tender, no erythema Skin: Warm dry intact with no signs of infection or rash on extremities or on axial skeleton. Abdomen: Soft nontender Neuro: Cranial  nerves II through XII are intact, neurovascularly intact in all extremities with 2+ DTRs and 2+ pulses. Lymph: No lymphadenopathy of posterior or anterior cervical chain or axillae bilaterally.  Gait normal with good balance and coordination.  MSK: Non tender with full range of motion and good stability and symmetric strength and tone of elbows, wrist, hip, knee and ankles bilaterally.  Shoulder: Inspection reveals no abnormalities, atrophy or asymmetry. Palpation is normal with no tenderness over AC joint or bicipital groove. ROM is full in all planes. Rotator cuff strength normal throughout. No signs of impingement with negative Neer and Hawkin's tests, empty can sign. Speeds and Yergason's tests normal. No labral pathology noted with negative Obrien's, negative clunk and good stability. Normal scapular function observed. No painful arc and no drop arm sign. No apprehension sign Contralateral shoulder Neck: Inspection shows significant lordosis. Patient also has increasing kyphosis of the thoracic spine. No palpable stepoffs. Negative Spurling's maneuver. Patient though is very tender to palpation on the left paraspinal musculature Patient has limited range of motion of the neck in extension as well as side bending bilaterally to only approximately 10. Grip strength and sensation normal in bilateral hands Strength good C4 to T1 distribution No sensory change to C4 to T1 Negative Hoffman sign bilaterally Reflexes normal  X-rays were ordered reviewed and interpreted by me today. X-rays show that patient has multilevel degenerative disc disease most prominently at the C5-6 level but also has some at the C3-4 level which would correspond with her symptoms.  Impression and Recommendations:     This case required medical decision making of moderate complexity.

## 2013-03-11 NOTE — Progress Notes (Signed)
Pre-visit discussion using our clinic review tool. No additional management support is needed unless otherwise documented below in the visit note.  

## 2013-03-11 NOTE — Assessment & Plan Note (Addendum)
I think more cervicogenic but cannot r/o shoulder, for sport med eval today per Dr Tamala Julian , gave once dialy generic ultram ER 100 qd, for breakthrough pain if tylenol not helping enough

## 2013-03-11 NOTE — Assessment & Plan Note (Signed)
stable overall by history and exam, recent data reviewed with pt, and pt to continue medical treatment as before,  to f/u any worsening symptoms or concerns BP Readings from Last 3 Encounters:  03/11/13 130/80  02/15/13 160/100  02/09/13 140/88

## 2013-03-12 ENCOUNTER — Emergency Department (HOSPITAL_COMMUNITY)
Admission: EM | Admit: 2013-03-12 | Discharge: 2013-03-12 | Disposition: A | Discharge status: Home / Self Care | Payer: Medicare Other | Attending: Emergency Medicine | Admitting: Emergency Medicine

## 2013-03-12 ENCOUNTER — Encounter (HOSPITAL_COMMUNITY): Payer: Self-pay | Admitting: Emergency Medicine

## 2013-03-12 DIAGNOSIS — Z7982 Long term (current) use of aspirin: Secondary | ICD-10-CM | POA: Insufficient documentation

## 2013-03-12 DIAGNOSIS — R55 Syncope and collapse: Secondary | ICD-10-CM | POA: Insufficient documentation

## 2013-03-12 DIAGNOSIS — Z860100 Personal history of colon polyps, unspecified: Secondary | ICD-10-CM | POA: Insufficient documentation

## 2013-03-12 DIAGNOSIS — S161XXA Strain of muscle, fascia and tendon at neck level, initial encounter: Secondary | ICD-10-CM

## 2013-03-12 DIAGNOSIS — M479 Spondylosis, unspecified: Secondary | ICD-10-CM | POA: Diagnosis not present

## 2013-03-12 DIAGNOSIS — I4891 Unspecified atrial fibrillation: Secondary | ICD-10-CM | POA: Diagnosis not present

## 2013-03-12 DIAGNOSIS — Z8669 Personal history of other diseases of the nervous system and sense organs: Secondary | ICD-10-CM | POA: Insufficient documentation

## 2013-03-12 DIAGNOSIS — Z8719 Personal history of other diseases of the digestive system: Secondary | ICD-10-CM | POA: Insufficient documentation

## 2013-03-12 DIAGNOSIS — Y939 Activity, unspecified: Secondary | ICD-10-CM | POA: Insufficient documentation

## 2013-03-12 DIAGNOSIS — I252 Old myocardial infarction: Secondary | ICD-10-CM | POA: Diagnosis not present

## 2013-03-12 DIAGNOSIS — R5381 Other malaise: Secondary | ICD-10-CM | POA: Insufficient documentation

## 2013-03-12 DIAGNOSIS — I059 Rheumatic mitral valve disease, unspecified: Secondary | ICD-10-CM | POA: Insufficient documentation

## 2013-03-12 DIAGNOSIS — Z79899 Other long term (current) drug therapy: Secondary | ICD-10-CM | POA: Insufficient documentation

## 2013-03-12 DIAGNOSIS — E7800 Pure hypercholesterolemia, unspecified: Secondary | ICD-10-CM | POA: Insufficient documentation

## 2013-03-12 DIAGNOSIS — I10 Essential (primary) hypertension: Secondary | ICD-10-CM | POA: Diagnosis not present

## 2013-03-12 DIAGNOSIS — D509 Iron deficiency anemia, unspecified: Secondary | ICD-10-CM | POA: Insufficient documentation

## 2013-03-12 DIAGNOSIS — Z8619 Personal history of other infectious and parasitic diseases: Secondary | ICD-10-CM | POA: Insufficient documentation

## 2013-03-12 DIAGNOSIS — S139XXA Sprain of joints and ligaments of unspecified parts of neck, initial encounter: Secondary | ICD-10-CM | POA: Insufficient documentation

## 2013-03-12 DIAGNOSIS — R42 Dizziness and giddiness: Secondary | ICD-10-CM

## 2013-03-12 DIAGNOSIS — Y929 Unspecified place or not applicable: Secondary | ICD-10-CM | POA: Insufficient documentation

## 2013-03-12 DIAGNOSIS — M810 Age-related osteoporosis without current pathological fracture: Secondary | ICD-10-CM | POA: Insufficient documentation

## 2013-03-12 DIAGNOSIS — Z95818 Presence of other cardiac implants and grafts: Secondary | ICD-10-CM | POA: Diagnosis not present

## 2013-03-12 DIAGNOSIS — I2510 Atherosclerotic heart disease of native coronary artery without angina pectoris: Secondary | ICD-10-CM | POA: Insufficient documentation

## 2013-03-12 DIAGNOSIS — X58XXXA Exposure to other specified factors, initial encounter: Secondary | ICD-10-CM | POA: Insufficient documentation

## 2013-03-12 LAB — COMPREHENSIVE METABOLIC PANEL
AST: 29 U/L (ref 0–37)
Albumin: 5 g/dL (ref 3.5–5.2)
Alkaline Phosphatase: 73 U/L (ref 39–117)
BUN: 13 mg/dL (ref 6–23)
Calcium: 9.8 mg/dL (ref 8.4–10.5)
Chloride: 94 mEq/L — ABNORMAL LOW (ref 96–112)
Glucose, Bld: 114 mg/dL — ABNORMAL HIGH (ref 70–99)
Potassium: 3.8 mEq/L (ref 3.5–5.1)
Total Bilirubin: 0.5 mg/dL (ref 0.3–1.2)
Total Protein: 8.6 g/dL — ABNORMAL HIGH (ref 6.0–8.3)

## 2013-03-12 LAB — CBC WITH DIFFERENTIAL/PLATELET
Basophils Absolute: 0 10*3/uL (ref 0.0–0.1)
Hemoglobin: 14.1 g/dL (ref 12.0–15.0)
Lymphocytes Relative: 18 % (ref 12–46)
Lymphs Abs: 1.3 10*3/uL (ref 0.7–4.0)
MCV: 95.7 fL (ref 78.0–100.0)
Monocytes Absolute: 0.4 10*3/uL (ref 0.1–1.0)
Neutro Abs: 5.4 10*3/uL (ref 1.7–7.7)
Neutrophils Relative %: 76 % (ref 43–77)
Platelets: 258 10*3/uL (ref 150–400)
RBC: 4.21 MIL/uL (ref 3.87–5.11)
RDW: 12.5 % (ref 11.5–15.5)
WBC: 7.1 10*3/uL (ref 4.0–10.5)

## 2013-03-12 LAB — TROPONIN I: Troponin I: <0.3 ng/mL (ref <0.30)

## 2013-03-12 MED ORDER — LABETALOL HCL 5 MG/ML IV SOLN
10.0000 mg | Freq: Once | INTRAVENOUS | Status: AC
  Administered 2013-03-12: 10 mg via INTRAVENOUS
  Filled 2013-03-12: qty 4

## 2013-03-12 MED ORDER — SODIUM CHLORIDE 0.9 % IV BOLUS (SEPSIS)
250.0000 mL | Freq: Once | INTRAVENOUS | Status: AC
  Administered 2013-03-12: 250 mL via INTRAVENOUS

## 2013-03-12 MED ORDER — SODIUM CHLORIDE 0.9 % IV SOLN: Freq: Once | INTRAVENOUS | Status: DC

## 2013-03-12 NOTE — ED Notes (Signed)
Pt has 3 blankets removed 1 blanket and replaced with fresh warm blanket Pt complains of being cold.

## 2013-03-12 NOTE — ED Provider Notes (Signed)
CSN: [id number]     Arrival date & time 03/12/13  1625 History   First MD Initiated Contact with Patient 03/12/13 1650     Chief Complaint  Patient presents with  . Neck Pain  . Near Syncope   (Consider location/radiation/quality/duration/timing/severity/associated sxs/prior Treatment) HPI Comments: Patient presents to the ER for feeling like she is going to pass out. She reports that she has been having pain on the left side of her neck for approximately 2 weeks. She was seen by her primary care doctor and a sports medicine doctor yesterday. She was prescribed Ultram to be used for the pain, but has not taken it. She has taken Tylenol twice today, however. Patient reports that the pain persisted has not been improved by Tylenol, but it has not worsened. She felt very weak earlier has been sitting in a chair all day because of her generalized weakness. Has not had any chest pain or heart palpitations. She is not short of breath.  Patient is a 79 y.o. female presenting with neck pain.  Neck Pain Associated symptoms: weakness     Past Medical History  Diagnosis Date  . HYPERCHOLESTEROLEMIA 09/17/2009  . MITRAL VALVE PROLAPSE 03/09/2007    Remotely - last echo 2011 did not show this  . ANEMIA-IRON DEFICIENCY 03/09/2007  . LEUKOPENIA, MILD 05/30/2008  . HYPERTENSION 11/12/2006  . COPD 03/09/2007    ? pt is unsure  . OSTEOARTHRITIS 02/19/2009    Spinal  . OSTEOPOROSIS 11/12/2006  . COLONIC POLYPS, HX OF 03/09/2007  . HYPERGLYCEMIA 07/02/2007  . ASYMPTOMATIC POSTMENOPAUSAL STATUS 07/17/2008  . PSVT     a. Remotely, in setting of anemia.  [name] Positive H. pylori test 10/1996  . Cataract   . Blood transfusion     a. 05/2010: 3 transfusions for anemia/GIB.  [name] GI bleed     a. 05/2010: Hgb 7 at outside hospital s/p 3 u PRBC, EGD 05/2010 mild gastritis, small hiatal hernia, Schatzki ring, negative colonoscopy; capsule endoscopy 06/2010 - possible nonspecific inflammatory changes.  [name] Hx of echocardiogram       a. echo 2/11:  EF 55-60%, mild AI, mild RAE, mild to mod TR  . NSTEMI (non-ST elevated myocardial infarction) 03/2012    a. Type II in setting of AF with RVR 03/2012  . Atrial fibrillation     a. Apixaban started 03/2012  . CAD (coronary artery disease)     a. LHC 1/14:  mLAD serial 30 and 60%, oRCA 30%, EF 60%  . Hx of echocardiogram     a. Echo 2/14:  Mild focal basal and mild LVH, EF 60-65%, mild AI, mild RAE, trivia effusion  . Spinal stenosis of lumbar region 07/16/2012   Past Surgical History  Procedure Laterality Date  . Abdominal hysterectomy    . Tonsillectomy    . Tubal ligation    . Cataract extraction    . Cardiac catheterization  1992    S/P in 1992 at [facility] in [place]. This was negative for any coronary artery disease  . Carotid duplex  08/29/2003  . Ceasarean     Family History  Problem Relation Age of Onset  . Cancer Mother     stomach cancer  . Sarcoidosis Sister   . Cancer Brother     colon cancer  . Sarcoidosis Other   . CAD Neg Hx    History  Substance Use Topics  . Smoking status: Never Smoker   . Smokeless tobacco:  Not on file  . Alcohol Use: No   OB History   Grav Para Term Preterm Abortions TAB SAB Ect Mult Living                 Review of Systems  Musculoskeletal: Positive for neck pain.  Neurological: Positive for weakness.  All other systems reviewed and are negative.    Allergies  Azithromycin; Cefuroxime axetil; Clarithromycin; Hydrocod polst-cpm polst er; Metronidazole; and Rofecoxib  Home Medications   Current Outpatient Rx  Name  Route  Sig  Dispense  Refill  . acetaminophen (TYLENOL) 500 MG tablet   Oral   Take 1,000 mg by mouth every 6 (six) hours as needed. For pain/fever         . aspirin EC 81 MG tablet   Oral   Take 1 tablet (81 mg total) by mouth daily.         [name] atorvastatin (LIPITOR) 20 MG tablet   Oral   Take 1 tablet (20 mg total) by mouth daily at 6 PM.   90 tablet   3    . b complex vitamins tablet   Oral   Take 1 tablet by mouth 2 (two) times a week.          . Calcium Carbonate-Vitamin D (CALTRATE 600+D) 600-400 MG-UNIT per tablet   Oral   Take 1 tablet by mouth daily.           . fluticasone (CUTIVATE) 0.05 % cream      Use as directed up to 4 times per day as needed for leg rash   30 g   1   . Iron 66 MG TABS   Oral   Take 1 tablet by mouth every other day.         . metoprolol succinate (TOPROL-XL) 50 MG 24 hr tablet   Oral   Take 25 mg by mouth daily. Take with or immediately following a meal.         . Multiple Vitamin (MULTIVITAMIN) tablet   Oral   Take 1 tablet by mouth daily.           . nitroGLYCERIN (NITROSTAT) 0.4 MG SL tablet   Sublingual   Place 1 tablet (0.4 mg total) under the tongue every 5 (five) minutes x 3 doses as needed for chest pain.   25 tablet   11   . tacrolimus (PROTOPIC) 0.1 % ointment   Topical   Apply topically 2 (two) times daily. As needed, thin layer   30 g   1   . traMADol (ULTRAM ER) 100 MG 24 hr tablet   Oral   Take 1 tablet (100 mg total) by mouth daily.   30 tablet   5    BP 189/88  Pulse 82  Temp(Src) 97.5 F (36.4 C) (Oral)  Resp 16  SpO2 100% Physical Exam  Constitutional: She is oriented to person, place, and time. She appears well-developed and well-nourished. No distress.  HENT:  Head: Normocephalic and atraumatic.  Right Ear: Hearing normal.  Left Ear: Hearing normal.  Nose: Nose normal.  Mouth/Throat: Oropharynx is clear and moist and mucous membranes are normal.  Eyes: Conjunctivae and EOM are normal. Pupils are equal, round, and reactive to light.  Neck: Normal range of motion. Neck supple.  Cardiovascular: Regular rhythm, S1 normal and S2 normal.  Exam reveals no gallop and no friction rub.   No murmur heard. Pulmonary/Chest: Effort normal and breath sounds normal. No  respiratory distress. She exhibits no tenderness.  Abdominal: Soft. Normal appearance and  bowel sounds are normal. There is no hepatosplenomegaly. There is no tenderness. There is no rebound, no guarding, no tenderness at McBurney's point and negative Murphy's sign. No hernia.  Musculoskeletal: Normal range of motion.       Cervical back: She exhibits tenderness. She exhibits no bony tenderness.       Back:  Neurological: She is alert and oriented to person, place, and time. She has normal strength. No cranial nerve deficit or sensory deficit. Coordination normal. GCS eye subscore is 4. GCS verbal subscore is 5. GCS motor subscore is 6.  Skin: Skin is warm, dry and intact. No rash noted. No cyanosis.  Psychiatric: She has a normal mood and affect. Her speech is normal and behavior is normal. Thought content normal.    ED Course  Procedures (including critical care time) Labs Review Labs Reviewed  CBC WITH DIFFERENTIAL  TROPONIN I  COMPREHENSIVE METABOLIC PANEL   Imaging Review Dg Cervical Spine Complete  03/11/2013   CLINICAL DATA:  Left-sided neck pain.  EXAM: CERVICAL SPINE  4+ VIEWS  COMPARISON:  None.  FINDINGS: Moderate to severe degenerative disc disease is noted at C5-6. No significant neuroforaminal stenosis is noted. No fracture or spondylolisthesis is noted. Mild degenerative disc disease is also noted at C3-4 and C6-7. Posterior facet joints appear intact.  IMPRESSION: Multilevel degenerative disc disease, most prominently seen at C5-6. No acute abnormality seen in the cervical spine.   Electronically Signed   By: Sabino Dick M.D.   On: 03/11/2013 15:20    EKG Interpretation    Date/Time:  Saturday March 12 2013 16:27:49 EST Ventricular Rate:  85 PR Interval:  148 QRS Duration: 92 QT Interval:  412 QTC Calculation: 490 R Axis:   81 Text Interpretation:  Normal sinus rhythm Prolonged QT Nonspecific ST and T wave abnormality Confirmed by POLLINA  MD, CHRISTOPHER ([id number]) on 03/12/2013 4:51:35 PM            MDM  Diagnosis: 1. Uncontrolled hypertension  2. Musculoskeletal neck pain  She presents to ER for feeling faint. Patient reports that she has been feeling weak all day. She felt like she was going to pass out. No associated cardiac symptoms and work up is unremarkable. Patient is complaining of pain in his left neck area. This has been chronic for 2 weeks and has been evaluated by her primary care doctor and a sports medicine specialist. Examination today reveals tenderness and is clearly musculoskeletal.  Patient's blood pressure was treated with labetalol here in the ER and has significantly improved. She was given a small fluid bolus and she feels better. She is appropriate for discharge to followup with her doctor.    Orpah Greek, MD 03/12/13 [phone]

## 2013-03-12 NOTE — ED Notes (Signed)
The pt is here for neck pain and feeling faint.  She has had the neck pain for 2 weeks and she saw her doctor yesterday for the same and was seen by ortho also and had a neck xray.  She has a history of af .  No neck injury

## 2013-03-14 ENCOUNTER — Telehealth: Payer: Self-pay | Admitting: *Deleted

## 2013-03-14 NOTE — Telephone Encounter (Signed)
Pt called states she is unable to locate the herbs advised for her.  Please advise

## 2013-03-14 NOTE — Telephone Encounter (Signed)
Take tylenol 650 mg three times a day is the best evidence based medicine we have for arthritis.  Glucosamine sulfate 750mg  twice a day is a supplement that has been shown to help moderate to severe arthritis. Vitamin D 1000 IU daily Fish oil 2 grams daily.  Tumeric 500mg  twice daily.  Capsaicin topically up to four times a day may also help with pain.  These are usually what I suggest.  When you can please call and tell her.

## 2013-03-14 NOTE — Telephone Encounter (Signed)
lmovm for pt to return call.  

## 2013-03-15 ENCOUNTER — Telehealth: Payer: Self-pay | Admitting: *Deleted

## 2013-03-15 MED ORDER — HYDROCHLOROTHIAZIDE 12.5 MG PO TABS: 12.5000 mg | ORAL_TABLET | Freq: Every day | ORAL | Status: DC

## 2013-03-15 NOTE — Telephone Encounter (Signed)
Spoke with pt she states her BP is still elevated.  Yesterday BP was 137/99.  Sunday @9 :30am 131/77; 12:45pm 13/80; 6:15pm 138/91.  Please advise

## 2013-03-15 NOTE — Telephone Encounter (Signed)
Spoke with pt advised advised of MDs message.

## 2013-03-15 NOTE — Telephone Encounter (Signed)
Patient informed of medication sent to the pharmacy and informed of MD instruction.s

## 2013-03-15 NOTE — Telephone Encounter (Signed)
Pt req a call back from the asistant, pt state that she pick her med up and she has a question about it

## 2013-03-15 NOTE — Telephone Encounter (Signed)
Called left message to call back 

## 2013-03-15 NOTE — Telephone Encounter (Signed)
Although not severely elevated, she should cont the toprol xl 50, and add HCT 12.5 qd (done erx)  Remember it takes about a wk to get the full effect, to cont to monitor BP's

## 2013-03-22 ENCOUNTER — Telehealth: Payer: Self-pay

## 2013-03-22 MED ORDER — ONDANSETRON HCL 4 MG PO TABS: 4.0000 mg | ORAL_TABLET | Freq: Three times a day (TID) | ORAL | Status: DC | PRN

## 2013-03-22 NOTE — Telephone Encounter (Signed)
Patient informed of MD instructions.  The patient did want to be seen and transferred to front to schedule appontment.

## 2013-03-22 NOTE — Telephone Encounter (Signed)
i can rx some anti-emetic, but as this is a new problem recently, I would not have other advice on the cause;  Consider OV if persists, further vomiting, fever, worsening pain, worsening diarrhea, blood, wt loss, dizziness, weakness, falls

## 2013-03-22 NOTE — Telephone Encounter (Signed)
Patient was taking Tramadol and had to stop due to vomiting.  She is taking only maintenance meds. And tylenol  At this time.  Symptoms are nausea (unable to vomit), no fever, BP elevated (152/89 at 12:20pm and pulse 82), feels light headed and has a soft stool but not watery, drinking water and just feels awful.  Please advise call back number [phone]

## 2013-03-23 ENCOUNTER — Ambulatory Visit (INDEPENDENT_AMBULATORY_CARE_PROVIDER_SITE_OTHER): Payer: Medicare Other | Admitting: Internal Medicine

## 2013-03-23 ENCOUNTER — Telehealth: Payer: Self-pay

## 2013-03-23 ENCOUNTER — Encounter: Payer: Self-pay | Admitting: Internal Medicine

## 2013-03-23 VITALS — BP 130/84 | HR 80 | Temp 97.8°F | Resp 16 | Ht 64.0 in | Wt 110.8 lb

## 2013-03-23 DIAGNOSIS — M47812 Spondylosis without myelopathy or radiculopathy, cervical region: Secondary | ICD-10-CM | POA: Diagnosis not present

## 2013-03-23 DIAGNOSIS — M542 Cervicalgia: Secondary | ICD-10-CM

## 2013-03-23 MED ORDER — METHOCARBAMOL 500 MG PO TABS: 500.0000 mg | ORAL_TABLET | Freq: Four times a day (QID) | ORAL | Status: DC

## 2013-03-23 MED ORDER — PROMETHAZINE HCL 25 MG PO TABS: 25.0000 mg | ORAL_TABLET | Freq: Three times a day (TID) | ORAL | Status: DC | PRN

## 2013-03-23 MED ORDER — TRAMADOL HCL 50 MG PO TABS: 50.0000 mg | ORAL_TABLET | Freq: Three times a day (TID) | ORAL | Status: DC | PRN

## 2013-03-23 NOTE — Assessment & Plan Note (Signed)
She will try tramadol again and will cont tylenol as needed She also appears to have some component of spasm so I have asked her to try robaxin I think she would benefit seeing a pain specialist (? Need for an ESI)

## 2013-03-23 NOTE — Patient Instructions (Signed)

## 2013-03-23 NOTE — Telephone Encounter (Signed)
The patient called and stated the insurance does not cover the Zofran 4mg  that was called in for her.  She stated it is too expensive to get without the insurance coverage.  She is hoping to get a substitute (the pharmacy suggested Promethazime (spelling?)   pts callback - [phone]

## 2013-03-23 NOTE — Telephone Encounter (Signed)
Done erx 

## 2013-03-23 NOTE — Progress Notes (Signed)
Pre visit review using our clinic review tool, if applicable. No additional management support is needed unless otherwise documented below in the visit note. 

## 2013-03-23 NOTE — Telephone Encounter (Signed)
Patient informed. 

## 2013-03-23 NOTE — Progress Notes (Signed)
Subjective:    Patient ID: Lindsay Doyle, female    DOB: [birth year], 79 y.o.   MRN: [id number]  Neck Pain  This is a recurrent problem. The current episode started 1 to 4 weeks ago. The problem occurs intermittently. The problem has been unchanged. The pain is associated with nothing. The pain is present in the left side (the pain radiates up into the left side of her scalp). The quality of the pain is described as aching and stabbing. The pain is at a severity of 2/10. The pain is mild. Nothing aggravates the symptoms. The pain is worse during the day. Pertinent negatives include no chest pain, fever, headaches, leg pain, numbness, pain with swallowing, paresis, photophobia, syncope, tingling, trouble swallowing, visual change, weakness or weight loss. She has tried acetaminophen for the symptoms. The treatment provided mild relief.      Review of Systems  Constitutional: Negative.  Negative for fever and weight loss.  HENT: Negative.  Negative for trouble swallowing.   Eyes: Negative.  Negative for photophobia.  Respiratory: Negative.  Negative for cough, chest tightness, shortness of breath, wheezing and stridor.   Cardiovascular: Negative.  Negative for chest pain, leg swelling and syncope.  Gastrointestinal: Negative.  Negative for abdominal pain.  Endocrine: Negative.   Genitourinary: Negative.   Musculoskeletal: Positive for neck pain. Negative for back pain, gait problem, joint swelling, myalgias and neck stiffness.  Skin: Negative.   Allergic/Immunologic: Negative.   Neurological: Negative.  Negative for dizziness, tingling, tremors, weakness, light-headedness, numbness and headaches.  Hematological: Negative.  Negative for adenopathy. Does not bruise/bleed easily.  Psychiatric/Behavioral: Negative.        Objective:   Physical Exam  Vitals reviewed. Constitutional: She is oriented to person, place, and time. She appears well-developed and well-nourished. No distress.    HENT:  Head: Normocephalic and atraumatic.  Mouth/Throat: Oropharynx is clear and moist. No oropharyngeal exudate.  Eyes: Conjunctivae are normal. Right eye exhibits no discharge. Left eye exhibits no discharge. No scleral icterus.  Neck: Normal range of motion. Neck supple. No JVD present. No tracheal deviation present. No thyromegaly present.  Cardiovascular: Normal rate, regular rhythm, normal heart sounds and intact distal pulses.  Exam reveals no gallop and no friction rub.   No murmur heard. Pulmonary/Chest: Effort normal. No stridor. No respiratory distress. She has no wheezes. She has no rales. She exhibits no tenderness.  Abdominal: Soft. Bowel sounds are normal. She exhibits no distension and no mass. There is no tenderness. There is no rebound and no guarding.  Musculoskeletal: Normal range of motion. She exhibits no edema and no tenderness.       Cervical back: She exhibits deformity (accentuated lordosis ). She exhibits normal range of motion, no tenderness, no bony tenderness, no swelling, no edema, no laceration, no pain, no spasm and normal pulse.  Lymphadenopathy:    She has no cervical adenopathy.  Neurological: She is alert and oriented to person, place, and time. She has normal strength. She displays no atrophy, no tremor and normal reflexes. No cranial nerve deficit or sensory deficit. She exhibits normal muscle tone. She displays a negative Romberg sign. She displays no seizure activity. Coordination and gait normal.  Reflex Scores:      Tricep reflexes are 1+ on the right side and 1+ on the left side.      Bicep reflexes are 1+ on the right side and 1+ on the left side.      Brachioradialis reflexes are 1+ on  the right side and 1+ on the left side.      Patellar reflexes are 1+ on the right side and 1+ on the left side.      Achilles reflexes are 1+ on the right side and 1+ on the left side. Skin: Skin is warm and dry. No rash noted. She is not diaphoretic. No erythema.  No pallor.          Assessment & Plan:

## 2013-03-23 NOTE — Assessment & Plan Note (Signed)
She will try tramadol again and will cont tylenol as needed I have asked her to see pain management to consider further evaluation

## 2013-04-11 ENCOUNTER — Ambulatory Visit: Payer: Medicare Other | Admitting: Family Medicine

## 2013-04-20 ENCOUNTER — Encounter: Payer: Self-pay | Admitting: Internal Medicine

## 2013-04-20 ENCOUNTER — Ambulatory Visit (INDEPENDENT_AMBULATORY_CARE_PROVIDER_SITE_OTHER): Payer: Medicare Other | Admitting: Internal Medicine

## 2013-04-20 ENCOUNTER — Telehealth: Payer: Self-pay

## 2013-04-20 DIAGNOSIS — I2510 Atherosclerotic heart disease of native coronary artery without angina pectoris: Secondary | ICD-10-CM | POA: Diagnosis not present

## 2013-04-20 DIAGNOSIS — F411 Generalized anxiety disorder: Secondary | ICD-10-CM | POA: Diagnosis not present

## 2013-04-20 DIAGNOSIS — I10 Essential (primary) hypertension: Secondary | ICD-10-CM

## 2013-04-20 DIAGNOSIS — J449 Chronic obstructive pulmonary disease, unspecified: Secondary | ICD-10-CM | POA: Diagnosis not present

## 2013-04-20 DIAGNOSIS — J4489 Other specified chronic obstructive pulmonary disease: Secondary | ICD-10-CM

## 2013-04-20 MED ORDER — CITALOPRAM HYDROBROMIDE 10 MG PO TABS: 10.0000 mg | ORAL_TABLET | Freq: Every day | ORAL | Status: DC

## 2013-04-20 NOTE — Progress Notes (Signed)
Pre-visit discussion using our clinic review tool. No additional management support is needed unless otherwise documented below in the visit note.  

## 2013-04-20 NOTE — Assessment & Plan Note (Signed)
Also sees Dr [name], to f/u as recommended

## 2013-04-20 NOTE — Assessment & Plan Note (Signed)
stable overall by history and exam, recent data reviewed with pt, and pt to continue medical treatment as before,  to f/u any worsening symptoms or concerns BP Readings from Last 3 Encounters:  03/23/13 130/84  03/12/13 152/78  03/11/13 130/80

## 2013-04-20 NOTE — Progress Notes (Signed)
Subjective:    Patient ID: Lindsay Doyle, female    DOB: [birth year], 80 y.o.   MRN: [id number]  HPI  Here to f/u neck painl has seen Dr Ronnald Ramp and Dr Smith/sports med; has been tx with tramadol but caused nausea, some improved with antiemetic but then overall improved after she stopped all otc med including calcium, fish oil and all other recently recommended.  Seems to have been sort of suffering through taking tramadol despite the nausea, even though her left neck pain resolved after 10 days.  Kept taking it b/c she had a 30 day supply and thought she was supposed to.  Not sure if she could stop suddenly. Has one pill left.  Taking all meds in the AM except for statin at bedtime and tylenol prn.  Denies worsening depressive symptoms, suicidal ideation, or panic; has ongoing anxiety, some increased recently for some reason most days, willing to try citalopram.  Niece died and best friend had a stroke, nephew with recent 33.  Here with pastor husbnad who agrees counseling with a pastor (not him) might be helpful. Pt denies chest pain, increased sob or doe, wheezing, orthopnea, PND, increased LE swelling, palpitations, dizziness or syncope.  Pt denies polydipsia, polyuria,  Past Medical History  Diagnosis Date  . HYPERCHOLESTEROLEMIA 09/17/2009  . MITRAL VALVE PROLAPSE 03/09/2007    Remotely - last echo 2011 did not show this  . ANEMIA-IRON DEFICIENCY 03/09/2007  . LEUKOPENIA, MILD 05/30/2008  . HYPERTENSION 11/12/2006  . COPD 03/09/2007    ? pt is unsure  . OSTEOARTHRITIS 02/19/2009    Spinal  . OSTEOPOROSIS 11/12/2006  . COLONIC POLYPS, HX OF 03/09/2007  . HYPERGLYCEMIA 07/02/2007  . ASYMPTOMATIC POSTMENOPAUSAL STATUS 07/17/2008  . PSVT     a. Remotely, in setting of anemia.  [name] Positive H. pylori test 10/1996  . Cataract   . Blood transfusion     a. 05/2010: 3 transfusions for anemia/GIB.  [name] GI bleed     a. 05/2010: Hgb 7 at outside hospital s/p 3 u PRBC, EGD 05/2010 mild gastritis, small  hiatal hernia, Schatzki ring, negative colonoscopy; capsule endoscopy 06/2010 - possible nonspecific inflammatory changes.  [name] Hx of echocardiogram     a. echo 2/11:  EF 55-60%, mild AI, mild RAE, mild to mod TR  . NSTEMI (non-ST elevated myocardial infarction) 03/2012    a. Type II in setting of AF with RVR 03/2012  . Atrial fibrillation     a. Apixaban started 03/2012  . CAD (coronary artery disease)     a. LHC 1/14:  mLAD serial 30 and 60%, oRCA 30%, EF 60%  . Hx of echocardiogram     a. Echo 2/14:  Mild focal basal and mild LVH, EF 60-65%, mild AI, mild RAE, trivia effusion  . Spinal stenosis of lumbar region 07/16/2012   Past Surgical History  Procedure Laterality Date  . Abdominal hysterectomy    . Tonsillectomy    . Tubal ligation    . Cataract extraction    . Cardiac catheterization  1992    S/P in 1992 at [facility] in [place]. This was negative for any coronary artery disease  . Carotid duplex  08/29/2003  . Ceasarean      reports that she has never smoked. She does not have any smokeless tobacco history on file. She reports that she does not drink alcohol or use illicit drugs. family history includes Cancer in her brother and mother; Sarcoidosis  in her other and sister. There is no history of CAD. Allergies  Allergen Reactions  . Azithromycin Other (See Comments)    diarrhea  . Cefuroxime Axetil Other (See Comments)    diarrhea  . Clarithromycin Other (See Comments)    diarrhea  . Doxycycline   . Hydrocod Polst-Cpm Polst Er Other (See Comments)    Reaction unknown  . Metronidazole Other (See Comments)    Pt had difficulty swallowing this and says it was "horrible" to take. No allergic reaction.  . Rofecoxib     REACTION: unspecified   Current Outpatient Prescriptions on File Prior to Visit  Medication Sig Dispense Refill  . acetaminophen (TYLENOL) 500 MG tablet Take 1,000 mg by mouth daily as needed for mild pain. For pain/fever      . aspirin  EC 81 MG tablet Take 1 tablet (81 mg total) by mouth daily.      [name] atorvastatin (LIPITOR) 20 MG tablet Take 20 mg by mouth daily at 6 PM.      . b complex vitamins tablet Take 1 tablet by mouth 2 (two) times a week.       . Calcium Carbonate-Vitamin D (CALTRATE 600+D) 600-400 MG-UNIT per tablet Take 1 tablet by mouth daily.        . fluticasone (CUTIVATE) 0.05 % cream Apply 1 application topically 2 (two) times daily as needed (for leg rash).      . hydrochlorothiazide (HYDRODIURIL) 12.5 MG tablet Take 1 tablet (12.5 mg total) by mouth daily.  90 tablet  3  . Iron 66 MG TABS Take 66 mg by mouth every other day.       . metoprolol succinate (TOPROL-XL) 50 MG 24 hr tablet Take 50 mg by mouth daily. Take with or immediately following a meal.      . Multiple Vitamin (MULTIVITAMIN) tablet Take 1 tablet by mouth daily.        . nitroGLYCERIN (NITROSTAT) 0.4 MG SL tablet Place 0.4 mg under the tongue every 5 (five) minutes as needed for chest pain.      . promethazine (PHENERGAN) 25 MG tablet Take 1 tablet (25 mg total) by mouth every 8 (eight) hours as needed for nausea or vomiting.  30 tablet  1  . tacrolimus (PROTOPIC) 0.1 % ointment Apply 1 application topically 2 (two) times daily.      . traMADol (ULTRAM) 50 MG tablet Take 1 tablet (50 mg total) by mouth every 8 (eight) hours as needed.  65 tablet  1   No current facility-administered medications on file prior to visit.   Review of Systems  Constitutional: Negative for unexpected weight change, or unusual diaphoresis  HENT: Negative for tinnitus.   Eyes: Negative for photophobia and visual disturbance.  Respiratory: Negative for choking and stridor.   Gastrointestinal: Negative for vomiting and blood in stool.  Genitourinary: Negative for hematuria and decreased urine volume.  Musculoskeletal: Negative for acute joint swelling Skin: Negative for color change and wound.  Neurological: Negative for tremors and numbness other than noted    Psychiatric/Behavioral: Negative for decreased concentration or  hyperactivity.       Objective:   Physical Exam There were no vitals taken for this visit. VS noted,  Constitutional: Pt appears well-developed and well-nourished.  HENT: Head: NCAT.  Right Ear: External ear normal.  Left Ear: External ear normal.  Eyes: Conjunctivae and EOM are normal. Pupils are equal, round, and reactive to light.  Neck: Normal range of motion. Neck supple.  Cardiovascular: Normal rate and regular rhythm.   Pulmonary/Chest: Effort normal and breath sounds normal.  Abd:  Soft, NT, non-distended, + BS Neurological: Pt is alert. Not confused  Skin: Skin is warm. No erythema.  Psychiatric: Pt behavior is normal. Thought content normal. 1-2+ nervous    Assessment & Plan:

## 2013-04-20 NOTE — Assessment & Plan Note (Addendum)
Ok to start citalopram 10 qd, no SI, declines counseling at this time

## 2013-04-20 NOTE — Patient Instructions (Signed)
OK to stop the tramadol Please take all new medication as prescribed - the citalopram 10 mg per day for stress Please continue all other medications as before Please have the pharmacy call with any other refills you may need.  Please remember to sign up for My Chart if you have not done so, as this will be important to you in the future with finding out test results, communicating by private email, and scheduling acute appointments online when needed.

## 2013-04-20 NOTE — Telephone Encounter (Signed)
The patient called to request the new prescription Citalopram be removed from her list.  She discussed with her pharmacist and DOES NOT WANT TO TAKE... Medication has been removed.

## 2013-04-20 NOTE — Assessment & Plan Note (Signed)
With recent flare pain now resolved, ok to stop the tramadol, likely to help with reducing nausea as well

## 2013-04-20 NOTE — Assessment & Plan Note (Signed)
stable overall by history and exam, recent data reviewed with pt, and pt to continue medical treatment as before,  to f/u any worsening symptoms or concerns SpO2 Readings from Last 3 Encounters:  03/23/13 97%  03/12/13 100%  03/11/13 97%

## 2013-04-21 ENCOUNTER — Telehealth: Payer: Self-pay | Admitting: Internal Medicine

## 2013-04-21 NOTE — Telephone Encounter (Signed)
Relevant patient education assigned to patient using Emmi. ° °

## 2013-04-27 ENCOUNTER — Ambulatory Visit (INDEPENDENT_AMBULATORY_CARE_PROVIDER_SITE_OTHER): Payer: Medicare Other | Admitting: Physician Assistant

## 2013-04-27 ENCOUNTER — Encounter: Payer: Self-pay | Admitting: Physician Assistant

## 2013-04-27 VITALS — BP 160/90 | HR 88 | Ht 64.0 in | Wt 109.0 lb

## 2013-04-27 DIAGNOSIS — I4891 Unspecified atrial fibrillation: Secondary | ICD-10-CM

## 2013-04-27 DIAGNOSIS — I2510 Atherosclerotic heart disease of native coronary artery without angina pectoris: Secondary | ICD-10-CM

## 2013-04-27 DIAGNOSIS — R42 Dizziness and giddiness: Secondary | ICD-10-CM

## 2013-04-27 DIAGNOSIS — M47812 Spondylosis without myelopathy or radiculopathy, cervical region: Secondary | ICD-10-CM | POA: Diagnosis not present

## 2013-04-27 DIAGNOSIS — I10 Essential (primary) hypertension: Secondary | ICD-10-CM

## 2013-04-27 DIAGNOSIS — E785 Hyperlipidemia, unspecified: Secondary | ICD-10-CM

## 2013-04-27 LAB — BASIC METABOLIC PANEL
BUN: 11 mg/dL (ref 6–23)
CO2: 28 mEq/L (ref 19–32)
Calcium: 9.6 mg/dL (ref 8.4–10.5)
Chloride: 84 mEq/L — ABNORMAL LOW (ref 96–112)
Creatinine, Ser: 1 mg/dL (ref 0.4–1.2)
GFR: 66.68 mL/min (ref >60.00)
Glucose, Bld: 94 mg/dL (ref 70–99)
Potassium: 4 mEq/L (ref 3.5–5.1)
Sodium: 123 mEq/L — ABNORMAL LOW (ref 135–145)

## 2013-04-27 MED ORDER — METOPROLOL SUCCINATE ER 50 MG PO TB24: 100.0000 mg | ORAL_TABLET | Freq: Every day | ORAL | Status: DC

## 2013-04-27 NOTE — Progress Notes (Signed)
[address], [place], [place]  [address] Phone: [phone] Fax:  [phone]  Date:  04/27/2013   ID:  Lindsay Doyle, [name] 04-27-1933, MRN [id number]  PCP:  Cathlean Cower, MD  Cardiologist:  Dr. Loralie Champagne    History of Present Illness: Lindsay Doyle. [place] is a 80 y.o. female with a hx of paroxysmal atrial fibrillation. She was admitted in 1/14 with NSTEMI in the setting of AFib with RVR. She spontaneously converted to NSR. LHC demonstrated non-obstructive disease and she was felt to have had demand ischemia. Eliquis 2.5 mg bid was added due to stroke risk. Echo 05/20/12: EF 60-65%. Lindsay Doyle was admitted in 6/14 to [facility] with anemia. 1/2 stool guaiacs positive. She required transfusion. Eliquis was stopped due to concern that it triggered GI bleeding. No scopes were done.  Ultimately, she decided not to restart anticoagulation.  Last seen the Dr. [name] 01/2013.  She has had significant problems with neck pain related to severe cervical spine DJD.  She ended up going to the ED last month with dizziness in the setting of severe neck pain.  Her BP was high (197/102) and she was given IV Labetalol.  Her BP was improved prior to d/c home.  She denies any further lightheadedness since.  No syncope.  No chest pain.  No significant dyspnea.  No orthopnea, PND, edema.  She has been seeing her PCP and sports medicine.  Her neck pain is better.  She was placed on HCTZ for her BP.  She feels like this is making her feel bad.  She feels that her mouth is dry and she has lost her appetite.  She checks her BP at home and it tends to runs 150s or higher (systolic).    Recent Labs: 01/25/2013: HDL Cholesterol 88.20; LDL (calc) 95; TSH 1.53  03/12/2013: ALT 22; Creatinine 0.91; Hemoglobin 14.1; Potassium 3.8   Wt Readings from Last 3 Encounters:  03/23/13 110 lb 12 oz (50.236 kg)  03/11/13 112 lb (50.803 kg)  03/11/13 112 lb (50.803 kg)    Past Medical History:  1. Hyperlipidemia  2.  Anemia: Fe deficiency. Presumed GI bleed requiring transfusion in 6/14, anticoagulation stopped.  3. HTN  4. Osteoarthritis  5. H/o colonic polyps  6. Osteoporosis  7. GI bleed (3/12, 6/14): 3 units PRBCs, EGD with mild gastritis, colonoscopy negative, capsule endoscopy with nonspecific inflammatory changes. 6/14 GI bleeding (1/2 guaiac +), required transfusion.  8. CAD: LHC (1/14) with serial 30% and 60% LAD stenoses, 30% ostial RCA, EF 60%.  9. Paroxysmal atrial fibrillation. Echo (2/14) with EF 60-65%, mild focal basal septal hypertrophy, mild AI.   Past Medical History  Diagnosis Date  . Hyperlipidemia   . Mitral valve prolapse     Remotely - last echo 2011 did not show this  . ANEMIA-IRON DEFICIENCY     presumed GI bleed 08/2012- anticoag stopped  . LEUKOPENIA, MILD 05/30/2008  . Hypertension 11/12/2006  . COPD 03/09/2007    ? pt is unsure  . OSTEOARTHRITIS 02/19/2009    Spinal  . OSTEOPOROSIS 11/12/2006  . COLONIC POLYPS, HX OF 03/09/2007  . HYPERGLYCEMIA 07/02/2007  . PSVT     a. Remotely, in setting of anemia.  [name] Positive H. pylori test 10/1996  . Cataract   . Blood transfusion     a. 05/2010: 3 transfusions for anemia/GIB.  [name] GI bleed     a. 05/2010: Hgb 7 at outside hospital s/p 3 u PRBC,  EGD 05/2010 mild gastritis, small hiatal hernia, Schatzki ring, negative colonoscopy; capsule endoscopy 06/2010 - possible nonspecific inflammatory changes.;  b.  08/2012: 1/2 guiac +; required transfusion - Apixaban d/c'd  . Hx of echocardiogram     a. echo 2/11:  EF 55-60%, mild AI, mild RAE, mild to mod TR;   b.  a. Echo 2/14:  Mild focal basal and mild LVH, EF 60-65%, mild AI, mild RAE, trivia effusion  . NSTEMI (non-ST elevated myocardial infarction) 03/2012    a. Type II in setting of AF with RVR 03/2012  . Atrial fibrillation     a. Apixaban started 03/2012 - stopped after admx for GI bleed 6/14  . CAD (coronary artery disease)     a. LHC 1/14:  mLAD serial 30 and 60%, oRCA 30%, EF 60%  .  Spinal stenosis of lumbar region 07/16/2012    Current Outpatient Prescriptions  Medication Sig Dispense Refill  . acetaminophen (TYLENOL) 500 MG tablet Take 1,000 mg by mouth daily as needed for mild pain. For pain/fever      . aspirin EC 81 MG tablet Take 1 tablet (81 mg total) by mouth daily.      [name] atorvastatin (LIPITOR) 20 MG tablet Take 20 mg by mouth daily at 6 PM.      . b complex vitamins tablet Take 1 tablet by mouth 2 (two) times a week.       . Calcium Carbonate-Vitamin D (CALTRATE 600+D) 600-400 MG-UNIT per tablet Take 1 tablet by mouth daily.        . fluticasone (CUTIVATE) 0.05 % cream Apply 1 application topically 2 (two) times daily as needed (for leg rash).      . hydrochlorothiazide (HYDRODIURIL) 12.5 MG tablet Take 1 tablet (12.5 mg total) by mouth daily.  90 tablet  3  . Iron 66 MG TABS Take 66 mg by mouth every other day.       . metoprolol succinate (TOPROL-XL) 50 MG 24 hr tablet Take 50 mg by mouth daily. Take with or immediately following a meal.      . Multiple Vitamin (MULTIVITAMIN) tablet Take 1 tablet by mouth daily.        . nitroGLYCERIN (NITROSTAT) 0.4 MG SL tablet Place 0.4 mg under the tongue every 5 (five) minutes as needed for chest pain.      . promethazine (PHENERGAN) 25 MG tablet Take 1 tablet (25 mg total) by mouth every 8 (eight) hours as needed for nausea or vomiting.  30 tablet  1  . tacrolimus (PROTOPIC) 0.1 % ointment Apply 1 application topically 2 (two) times daily.      . traMADol (ULTRAM) 50 MG tablet Take 1 tablet (50 mg total) by mouth every 8 (eight) hours as needed.  65 tablet  1   No current facility-administered medications for this visit.    Allergies:   Azithromycin; Cefuroxime axetil; Clarithromycin; Doxycycline; Hydrocod polst-cpm polst er; Metronidazole; and Rofecoxib   Social History:  The patient  reports that she has never smoked. She does not have any smokeless tobacco history on file. She reports that she does not drink alcohol  or use illicit drugs.   Family History:  The patient's family history includes Cancer in her brother and mother; Sarcoidosis in her other and sister. There is no history of CAD.   ROS:  Please see the history of present illness.   She has a NP cough.  She feels anxious at times.  She is under a  lot of stress (a close friend and a niece have recently died).   All other systems reviewed and negative.   PHYSICAL EXAM: VS:  BP 160/90  Pulse 88  Ht 5\' 4"  (1.626 m)  Wt 109 lb (49.442 kg)  BMI 18.70 kg/m2 Well nourished, well developed, in no acute distress HEENT: normal Neck: no JVD Cardiac:  normal S1, S2; RRR; no murmur Lungs:  clear to auscultation bilaterally, no wheezing, rhonchi or rales Abd: soft, nontender, no hepatomegaly Ext: no edema Skin: warm and dry Neuro:  CNs 2-12 intact, no focal abnormalities noted  EKG:  NSR, HR 88, normal axis, NSSTTW changes, QTc 486 ms     ASSESSMENT AND PLAN:  1. Hypertension:  BP is uncontrolled.  She is not tolerating HCTZ.  Neck pain is improved. She is under a lot of stress.  She does not eat a high salt diet.  I will stop her HCTZ.  Increase Toprol to 100 QD.  Check a BMET today.  Consider ACEI or Amlodipine if BP remains uncontrolled.  She will monitor at home as well.  2. Dizziness:  This was related to severe pain and markedly elevated BP.  No further cardiac workup indicated at this time.  3. Atrial Fibrillation:  Maintaining NSR.  She is not on anticoagulation due to prior GI bleed.  She remains on ASA. 4. CAD:  No angina.  Continue ASA, statin.  5. Hyperlipidemia:  Continue statin.  6. Cervical Spine DJD:  F/u with primary care. 7. Disposition:  F/u with me in 6 weeks.   Signed, [name], PA-C  04/27/2013 2:04 PM

## 2013-04-27 NOTE — Patient Instructions (Addendum)
LAB WORK TODAY; BMET  Your physician has recommended you make the following change in your medication:  STOP HCTZ  INCREASE TOPROL XL TO 100 MG ; ,REFILL SENT IN WITH NEW DIRECTIONS  MONITOR BLOOD PRESSURE IF AFTER 1 WEEK OF INCREASING TOPROL AND BP IS STILL >150/90 CALL [phone]  PLEASE FOLLOW UP WITH [place], [facility] 06/08/13 @ 8:30

## 2013-04-28 ENCOUNTER — Telehealth: Payer: Self-pay | Admitting: *Deleted

## 2013-04-28 ENCOUNTER — Telehealth: Payer: Self-pay | Admitting: Physician Assistant

## 2013-04-28 DIAGNOSIS — I10 Essential (primary) hypertension: Secondary | ICD-10-CM

## 2013-04-28 MED ORDER — METOPROLOL SUCCINATE ER 50 MG PO TB24: 100.0000 mg | ORAL_TABLET | Freq: Every day | ORAL | Status: DC

## 2013-04-28 MED ORDER — METOPROLOL SUCCINATE ER 50 MG PO TB24: ORAL_TABLET | ORAL | Status: DC

## 2013-04-28 NOTE — Telephone Encounter (Signed)
pt advised ok per Brynda Rim. PA to try toprol xl 75 mg daily, monitor BP, SBP <110 then decrease to 50 mg daily. Pt understands Plan of Care

## 2013-04-28 NOTE — Telephone Encounter (Signed)
s/w pt about her confusion on metoprolol dose change. I said it was 100 mg daily, though I had her chart correct;  I did in errror type 100 mg twice daily. I apologized for the error. Pt states she took another 50 mg last night to make the 100 mg daily for yesterday. Pt took BP this morning at 8:51 112/78 hr 86; took again @ 10:53 am BP was 102/72 hr 93. Pt feels BP is too low for her; states she does not like her BP to be below 100 because it does not make her feel good. Pt states she feels she may only need 75 mg metoprolol daily. Advised w/ d/w PA and cb. Pt had been made aware of her lab results as well. Pt said she will be home all day waiting for my call back about her bp and metoprolol.

## 2013-04-28 NOTE — Telephone Encounter (Signed)
New problem:  Pt is asking for clarification on her toprol medication. Pt would like a call back from [place].

## 2013-05-04 ENCOUNTER — Other Ambulatory Visit (INDEPENDENT_AMBULATORY_CARE_PROVIDER_SITE_OTHER): Payer: Medicare Other

## 2013-05-04 DIAGNOSIS — I10 Essential (primary) hypertension: Secondary | ICD-10-CM

## 2013-05-04 LAB — BASIC METABOLIC PANEL
BUN: 12 mg/dL (ref 6–23)
CALCIUM: 9.4 mg/dL (ref 8.4–10.5)
CO2: 29 meq/L (ref 19–32)
Chloride: 100 mEq/L (ref 96–112)
Creatinine, Ser: 1.2 mg/dL (ref 0.4–1.2)
GFR: 58.72 mL/min — AB (ref >60.00)
Glucose, Bld: 122 mg/dL — ABNORMAL HIGH (ref 70–99)
POTASSIUM: 4.8 meq/L (ref 3.5–5.1)
SODIUM: 136 meq/L (ref 135–145)

## 2013-05-09 ENCOUNTER — Other Ambulatory Visit (HOSPITAL_COMMUNITY): Payer: Self-pay | Admitting: Otolaryngology

## 2013-05-09 DIAGNOSIS — R590 Localized enlarged lymph nodes: Secondary | ICD-10-CM

## 2013-05-10 DIAGNOSIS — B3731 Acute candidiasis of vulva and vagina: Secondary | ICD-10-CM | POA: Diagnosis not present

## 2013-05-10 DIAGNOSIS — Z01419 Encounter for gynecological examination (general) (routine) without abnormal findings: Secondary | ICD-10-CM | POA: Diagnosis not present

## 2013-05-10 DIAGNOSIS — Z1231 Encounter for screening mammogram for malignant neoplasm of breast: Secondary | ICD-10-CM | POA: Diagnosis not present

## 2013-05-10 DIAGNOSIS — B373 Candidiasis of vulva and vagina: Secondary | ICD-10-CM | POA: Diagnosis not present

## 2013-05-10 LAB — HM MAMMOGRAPHY

## 2013-05-19 ENCOUNTER — Ambulatory Visit (HOSPITAL_COMMUNITY)
Admission: RE | Admit: 2013-05-19 | Discharge: 2013-05-19 | Disposition: A | Discharge status: Home / Self Care | Payer: Medicare Other | Source: Ambulatory Visit | Attending: Otolaryngology | Admitting: Otolaryngology

## 2013-05-19 DIAGNOSIS — R590 Localized enlarged lymph nodes: Secondary | ICD-10-CM

## 2013-05-19 DIAGNOSIS — R599 Enlarged lymph nodes, unspecified: Secondary | ICD-10-CM | POA: Insufficient documentation

## 2013-05-19 DIAGNOSIS — E042 Nontoxic multinodular goiter: Secondary | ICD-10-CM | POA: Diagnosis not present

## 2013-05-31 ENCOUNTER — Telehealth: Payer: Self-pay | Admitting: *Deleted

## 2013-05-31 NOTE — Telephone Encounter (Signed)
Pt called left only her last name and [phone] as a message.  I spoke with the pt and she stated she called mistakenly.

## 2013-06-08 ENCOUNTER — Ambulatory Visit: Payer: Medicare Other | Admitting: Physician Assistant

## 2013-06-23 ENCOUNTER — Telehealth: Payer: Self-pay

## 2013-06-23 MED ORDER — HYDROCORTISONE ACETATE 25 MG RE SUPP
25.0000 mg | Freq: Two times a day (BID) | RECTAL | Status: DC
Start: 2013-06-23 — End: 2013-07-08

## 2013-06-23 NOTE — Telephone Encounter (Signed)
The patient was given nystatin ointment for hemorrhoids and it is not working.  Please send in something else to [place]. Please.  Call back number is [phone] or [phone]

## 2013-06-23 NOTE — Telephone Encounter (Signed)
Ok fr anusol hc - done erx

## 2013-06-24 NOTE — Telephone Encounter (Signed)
Patient informed. 

## 2013-07-08 ENCOUNTER — Telehealth: Payer: Self-pay | Admitting: Internal Medicine

## 2013-07-08 ENCOUNTER — Ambulatory Visit (INDEPENDENT_AMBULATORY_CARE_PROVIDER_SITE_OTHER): Payer: Medicare Other | Admitting: Family Medicine

## 2013-07-08 ENCOUNTER — Encounter: Payer: Self-pay | Admitting: Family Medicine

## 2013-07-08 VITALS — BP 122/86 | HR 90 | Temp 98.3°F | Wt 115.0 lb

## 2013-07-08 DIAGNOSIS — R05 Cough: Secondary | ICD-10-CM

## 2013-07-08 DIAGNOSIS — I2510 Atherosclerotic heart disease of native coronary artery without angina pectoris: Secondary | ICD-10-CM | POA: Diagnosis not present

## 2013-07-08 DIAGNOSIS — R059 Cough, unspecified: Secondary | ICD-10-CM | POA: Diagnosis not present

## 2013-07-08 DIAGNOSIS — R197 Diarrhea, unspecified: Secondary | ICD-10-CM

## 2013-07-08 MED ORDER — BENZONATATE 200 MG PO CAPS: 200.0000 mg | ORAL_CAPSULE | Freq: Two times a day (BID) | ORAL | Status: DC | PRN

## 2013-07-08 NOTE — Progress Notes (Signed)
SUBJECTIVE:  Lindsay Doyle is a 80 y.o. female who complains of dry cough and mild diarrhea after taking delsum for 2 days. She denies a history of chest pain, chills, fevers, vomiting and sputum production and has a history of asthma. Patient denies smoke cigarettes. Patient does have a past medical history significant for COPD. Patient states it does not seem to be giving her lungs any trouble. Denies any recent sick contacts. Patient states that her side is sore from all coughing. Denies any chest pain.   OBJECTIVE: Blood pressure 122/86, pulse 90, temperature 98.3 F (36.8 C), temperature source Oral, weight 115 lb (52.164 kg), SpO2 97.00%.  She appears well, vital signs are as noted. Ears normal.  Throat and pharynx normal but does have a postnasal drip.  Neck supple. Patient continues to have a large lymph node of the left side of her neck that is the patient's baseline. Lungs are clear to auscultation Nose is congested. Sinuses non tender. The chest is clear, without wheezes or rales.  ASSESSMENT:  allergic rhinitis  PLAN: Symptomatic therapy suggested: push fluids, rest and return office visit prn if symptoms persist or worsen. Lack of antibiotic effectiveness discussed with her. Call or return to clinic prn if these symptoms worsen or fail to improve as anticipated. Tessalon Perles given, discussed diet for patient's diarrhea this likely secondary to the sugar content and posterior. Patient will follow up again with primary care provider if not better in 72 hours.

## 2013-07-08 NOTE — Telephone Encounter (Signed)
Patient Information:  Caller Name: Azaira  Phone: [phone]  Patient: Lindsay, Doyle  Gender: Female  DOB: 1934/01/05  Age: 80 Years  PCP: Cathlean Cower (Adults only)  Office Follow Up:  Does the office need to follow up with this patient?: No  Instructions For The Office: N/A  RN Note:  She is going to stop the Delsym.  She is pushing fluids and urinating good.  Symptoms  Reason For Call & Symptoms: Patient calling, she has had a cough since Monday 4/6.  She went to the pharmacy on 4/9 and the pharmacist recommended Delsym.  She took the first dose at 0200 today, 61ml.  Now she has diarrhea.  She has had 3 diarrhea stools this am.  No vomiting.  Denies any blood, mucous or pus in the stools.  Reviewed Health History In EMR: Yes  Reviewed Medications In EMR: Yes  Reviewed Allergies In EMR: Yes  Reviewed Surgeries / Procedures: Yes  Date of Onset of Symptoms: 07/08/2013  Guideline(s) Used:  Diarrhea  Cough  Disposition Per Guideline:   See Today or Tomorrow in Office  Reason For Disposition Reached:   Patient wants to be seen  Advice Given:  Reassurance:  Here is some care advice that should help.  Nutrition:  Other acceptable foods include: bananas, yogurt, crackers, soup.  As your stools return to normal consistency, resume a normal diet.  Call Back If:  Signs of dehydration occur (e.g., no urine for more than 12 hours, very dry mouth, lightheaded, etc.)  Diarrhea lasts over 7 days  You become worse.  Patient Will Follow Care Advice:  YES  Appointment Scheduled:  07/08/2013 13:30:00 Appointment Scheduled Provider:  Charlann Boxer

## 2013-07-08 NOTE — Patient Instructions (Signed)
Stop delsum, high sugar causes diarrhea Can use imodium for 1-2 days if you need Bananas, rice apples and toast will help.  Once you can tolerate this it will be better For your cough teaspoon of room temperature honey can help.  The cough can last another week or 2.  Tessalon perles can help up to 2 times daily.  If fever or chills come back again and see your regular doctor.

## 2013-07-19 ENCOUNTER — Other Ambulatory Visit: Payer: Self-pay | Admitting: Internal Medicine

## 2013-07-19 NOTE — Telephone Encounter (Signed)
Please confirm if Lindsay Doyle is taking Toprol 50 or Toprol 75 daily. Chart says 75 and refill request is for 50. Thanks [name], PA-C   07/19/2013 2:03 PM

## 2013-07-26 ENCOUNTER — Other Ambulatory Visit (INDEPENDENT_AMBULATORY_CARE_PROVIDER_SITE_OTHER): Payer: Medicare Other

## 2013-07-26 ENCOUNTER — Ambulatory Visit (INDEPENDENT_AMBULATORY_CARE_PROVIDER_SITE_OTHER): Payer: Medicare Other | Admitting: Internal Medicine

## 2013-07-26 ENCOUNTER — Encounter: Payer: Self-pay | Admitting: Internal Medicine

## 2013-07-26 VITALS — BP 122/80 | HR 85 | Temp 97.4°F | Ht 64.0 in | Wt 113.2 lb

## 2013-07-26 DIAGNOSIS — I10 Essential (primary) hypertension: Secondary | ICD-10-CM

## 2013-07-26 DIAGNOSIS — R7309 Other abnormal glucose: Secondary | ICD-10-CM | POA: Diagnosis not present

## 2013-07-26 DIAGNOSIS — I2510 Atherosclerotic heart disease of native coronary artery without angina pectoris: Secondary | ICD-10-CM | POA: Diagnosis not present

## 2013-07-26 DIAGNOSIS — F411 Generalized anxiety disorder: Secondary | ICD-10-CM

## 2013-07-26 DIAGNOSIS — E785 Hyperlipidemia, unspecified: Secondary | ICD-10-CM

## 2013-07-26 DIAGNOSIS — R7302 Impaired glucose tolerance (oral): Secondary | ICD-10-CM

## 2013-07-26 LAB — HEPATIC FUNCTION PANEL
ALBUMIN: 4.2 g/dL (ref 3.5–5.2)
ALT: 14 U/L (ref 0–35)
AST: 21 U/L (ref 0–37)
Alkaline Phosphatase: 55 U/L (ref 39–117)
Bilirubin, Direct: 0.1 mg/dL (ref 0.0–0.3)
TOTAL PROTEIN: 7.5 g/dL (ref 6.0–8.3)
Total Bilirubin: 0.6 mg/dL (ref 0.3–1.2)

## 2013-07-26 LAB — LIPID PANEL
CHOLESTEROL: 178 mg/dL (ref 0–200)
HDL: 71.3 mg/dL (ref >39.00)
LDL Cholesterol: 95 mg/dL (ref 0–99)
Total CHOL/HDL Ratio: 2
Triglycerides: 58 mg/dL (ref 0.0–149.0)
VLDL: 11.6 mg/dL (ref 0.0–40.0)

## 2013-07-26 LAB — CBC WITH DIFFERENTIAL/PLATELET
Basophils Absolute: 0 10*3/uL (ref 0.0–0.1)
Basophils Relative: 0.7 % (ref 0.0–3.0)
EOS PCT: 3.3 % (ref 0.0–5.0)
Eosinophils Absolute: 0.2 10*3/uL (ref 0.0–0.7)
HCT: 36.1 % (ref 36.0–46.0)
Hemoglobin: 12.1 g/dL (ref 12.0–15.0)
Lymphocytes Relative: 30.8 % (ref 12.0–46.0)
Lymphs Abs: 2 10*3/uL (ref 0.7–4.0)
MCHC: 33.6 g/dL (ref 30.0–36.0)
MCV: 98.4 fl (ref 78.0–100.0)
MONO ABS: 0.7 10*3/uL (ref 0.1–1.0)
MONOS PCT: 11.6 % (ref 3.0–12.0)
NEUTROS PCT: 53.6 % (ref 43.0–77.0)
Neutro Abs: 3.4 10*3/uL (ref 1.4–7.7)
PLATELETS: 294 10*3/uL (ref 150.0–400.0)
RBC: 3.67 Mil/uL — ABNORMAL LOW (ref 3.87–5.11)
RDW: 13.5 % (ref 11.5–14.6)
WBC: 6.4 10*3/uL (ref 4.5–10.5)

## 2013-07-26 LAB — BASIC METABOLIC PANEL
BUN: 11 mg/dL (ref 6–23)
CHLORIDE: 101 meq/L (ref 96–112)
CO2: 30 mEq/L (ref 19–32)
Calcium: 9.9 mg/dL (ref 8.4–10.5)
Creatinine, Ser: 0.9 mg/dL (ref 0.4–1.2)
GFR: 78.88 mL/min (ref >60.00)
GLUCOSE: 96 mg/dL (ref 70–99)
Potassium: 5.1 mEq/L (ref 3.5–5.1)
Sodium: 138 mEq/L (ref 135–145)

## 2013-07-26 LAB — HEMOGLOBIN A1C: HEMOGLOBIN A1C: 5.7 % (ref 4.6–6.5)

## 2013-07-26 NOTE — Assessment & Plan Note (Signed)
stable overall by history and exam, recent data reviewed with pt, and pt to continue medical treatment as before,  to f/u any worsening symptoms or concerns Lab Results  Component Value Date   [place] 95 01/25/2013

## 2013-07-26 NOTE — Assessment & Plan Note (Signed)
stable overall by history and exam, recent data reviewed with pt, and pt to continue medical treatment as before,  to f/u any worsening symptoms or concerns BP Readings from Last 3 Encounters:  07/26/13 122/80  07/08/13 122/86  04/27/13 160/90

## 2013-07-26 NOTE — Patient Instructions (Signed)
Please continue all other medications as before, and refills have been done if requested. Please have the pharmacy call with any other refills you may need.  Please continue your efforts at being more active, low cholesterol diet, and weight control.  You are otherwise up to date with prevention measures today.  Please go to the LAB in the Basement (turn left off the elevator) for the tests to be done today  You will be contacted by phone if any changes need to be made immediately.  Otherwise, you will receive a letter about your results with an explanation, but please check with MyChart first.  Please remember to sign up for MyChart if you have not done so, as this will be important to you in the future with finding out test results, communicating by private email, and scheduling acute appointments online when needed.  Please return in 6 months, or sooner if needed

## 2013-07-26 NOTE — Progress Notes (Signed)
Subjective:    Patient ID: Lindsay Doyle, female    DOB: 05/08/1933, 80 y.o.   MRN: [id number]  HPI  Here to f/u; overall doing ok,  Pt denies chest pain, increased sob or doe, wheezing, orthopnea, PND, increased LE swelling, palpitations, dizziness or syncope.  Pt denies polydipsia, polyuria, or low sugar symptoms such as weakness or confusion improved with po intake.  Pt denies new neurological symptoms such as new headache, or facial or extremity weakness or numbness.   Pt states overall good compliance with meds, has been trying to follow lower cholesterol, diabetic diet, with wt overall stable. Supp for hemorrhoid helped.  Quite stressed over husbnad recurrent diverticular bleeding.  Denies worsening depressive symptoms, suicidal ideation, or panic; has ongoing anxiety.  Has card f/u planned in next few wks Past Medical History  Diagnosis Date  . Hyperlipidemia   . Mitral valve prolapse     Remotely - last echo 2011 did not show this  . ANEMIA-IRON DEFICIENCY     presumed GI bleed 08/2012- anticoag stopped  . LEUKOPENIA, MILD 05/30/2008  . Hypertension 11/12/2006  . COPD 03/09/2007    ? pt is unsure  . OSTEOARTHRITIS 02/19/2009    Spinal  . OSTEOPOROSIS 11/12/2006  . COLONIC POLYPS, HX OF 03/09/2007  . HYPERGLYCEMIA 07/02/2007  . PSVT     a. Remotely, in setting of anemia.  [name] Positive H. pylori test 10/1996  . Cataract   . Blood transfusion     a. 05/2010: 3 transfusions for anemia/GIB.  [name] GI bleed     a. 05/2010: Hgb 7 at outside hospital s/p 3 u PRBC, EGD 05/2010 mild gastritis, small hiatal hernia, Schatzki ring, negative colonoscopy; capsule endoscopy 06/2010 - possible nonspecific inflammatory changes.;  b.  08/2012: 1/2 guiac +; required transfusion - Apixaban d/c'd  . Hx of echocardiogram     a. echo 2/11:  EF 55-60%, mild AI, mild RAE, mild to mod TR;   b.  a. Echo 2/14:  Mild focal basal and mild LVH, EF 60-65%, mild AI, mild RAE, trivia effusion  . NSTEMI (non-ST elevated  myocardial infarction) 03/2012    a. Type II in setting of AF with RVR 03/2012  . Atrial fibrillation     a. Apixaban started 03/2012 - stopped after admx for GI bleed 6/14  . CAD (coronary artery disease)     a. LHC 1/14:  mLAD serial 30 and 60%, oRCA 30%, EF 60%  . Spinal stenosis of lumbar region 07/16/2012   Past Surgical History  Procedure Laterality Date  . Abdominal hysterectomy    . Tonsillectomy    . Tubal ligation    . Cataract extraction    . Cardiac catheterization  1992    S/P in 1992 at [facility] in [place]. This was negative for any coronary artery disease  . Carotid duplex  08/29/2003  . Ceasarean      reports that she has never smoked. She does not have any smokeless tobacco history on file. She reports that she does not drink alcohol or use illicit drugs. family history includes Cancer in her brother and mother; Sarcoidosis in her other and sister. There is no history of CAD. Allergies  Allergen Reactions  . Azithromycin Other (See Comments)    diarrhea  . Cefuroxime Axetil Other (See Comments)    diarrhea  . Clarithromycin Other (See Comments)    diarrhea  . Doxycycline   . Hydrocod Polst-Cpm Polst Er  Other (See Comments)    Reaction unknown  . Metronidazole Other (See Comments)    Pt had difficulty swallowing this and says it was "horrible" to take. No allergic reaction.  . Rofecoxib     REACTION: unspecified    Review of Systems  Constitutional: Negative for unusual diaphoresis or other sweats  HENT: Negative for ringing in ear Eyes: Negative for double vision or worsening visual disturbance.  Respiratory: Negative for choking and stridor.   Gastrointestinal: Negative for vomiting or other signifcant bowel change Genitourinary: Negative for hematuria or decreased urine volume.  Musculoskeletal: Negative for other MSK pain or swelling Skin: Negative for color change and worsening wound.  Neurological: Negative for tremors and  numbness other than noted  Psychiatric/Behavioral: Negative for decreased concentration or agitation other than above       Objective:   Physical Exam BP 122/80  Pulse 85  Temp(Src) 97.4 F (36.3 C) (Oral)  Ht 5\' 4"  (1.626 m)  Wt 113 lb 4 oz (51.37 kg)  BMI 19.43 kg/m2  SpO2 92% VS noted, not ill appearing Constitutional: Pt appears well-developed, well-nourished.  HENT: Head: NCAT.  Right Ear: External ear normal.  Left Ear: External ear normal.  Eyes: . Pupils are equal, round, and reactive to light. Conjunctivae and EOM are normal Neck: Normal range of motion. Neck supple.  Cardiovascular: Normal rate and regular rhythm.   Pulmonary/Chest: Effort normal and breath sounds normal.  Abd:  Soft, NT, ND, + BS Neurological: Pt is alert. Not confused , motor grossly intact Skin: Skin is warm. No rash Psychiatric: Pt behavior is normal. No agitation.     Assessment & Plan:

## 2013-07-26 NOTE — Assessment & Plan Note (Signed)
stable overall by history and exam, recent data reviewed with pt, and pt to continue medical treatment as before,  to f/u any worsening symptoms or concerns Lab Results  Component Value Date   HGBA1C 6.2 01/25/2013

## 2013-07-26 NOTE — Assessment & Plan Note (Signed)
stable overall by history and exam, recent data reviewed with pt, and pt to continue medical treatment as before,  to f/u any worsening symptoms or concerns Lab Results  Component Value Date   WBC 7.1 03/12/2013   HGB 14.1 03/12/2013   HCT 40.3 03/12/2013   PLT 258 03/12/2013   GLUCOSE 122* 05/04/2013   CHOL 197 01/25/2013   TRIG 68.0 01/25/2013   HDL 88.20 01/25/2013   LDLDIRECT 223.7 01/08/2012   LDLCALC 95 01/25/2013   ALT 22 03/12/2013   AST 29 03/12/2013   NA 136 05/04/2013   K 4.8 05/04/2013   CL 100 05/04/2013   CREATININE 1.2 05/04/2013   BUN 12 05/04/2013   CO2 29 05/04/2013   TSH 1.53 01/25/2013   INR 1.00 09/17/2012   HGBA1C 6.2 01/25/2013   declnes further tx or counseling at this time

## 2013-07-26 NOTE — Progress Notes (Signed)
Pre visit review using our clinic review tool, if applicable. No additional management support is needed unless otherwise documented below in the visit note. 

## 2013-07-28 MED ORDER — DICLOFENAC SODIUM 1 % TD GEL: 4.0000 g | Freq: Four times a day (QID) | TRANSDERMAL | Status: DC

## 2013-07-28 NOTE — Addendum Note (Signed)
Addended by: Biagio Borg on: 07/28/2013 12:56 PM   Modules accepted: Orders

## 2013-08-05 ENCOUNTER — Ambulatory Visit (INDEPENDENT_AMBULATORY_CARE_PROVIDER_SITE_OTHER): Payer: Medicare Other | Admitting: Cardiology

## 2013-08-05 ENCOUNTER — Encounter: Payer: Self-pay | Admitting: Cardiology

## 2013-08-05 VITALS — BP 107/72 | HR 81 | Ht 64.0 in | Wt 113.0 lb

## 2013-08-05 DIAGNOSIS — I4891 Unspecified atrial fibrillation: Secondary | ICD-10-CM

## 2013-08-05 DIAGNOSIS — I2510 Atherosclerotic heart disease of native coronary artery without angina pectoris: Secondary | ICD-10-CM

## 2013-08-05 DIAGNOSIS — I10 Essential (primary) hypertension: Secondary | ICD-10-CM

## 2013-08-05 DIAGNOSIS — E785 Hyperlipidemia, unspecified: Secondary | ICD-10-CM | POA: Diagnosis not present

## 2013-08-05 NOTE — Patient Instructions (Signed)
Your physician recommends that you continue on your current medications as directed. Please refer to the Current Medication list given to you today.  Your physician wants you to follow-up in: 6 months with Dr. McLean. You will receive a reminder letter in the mail two months in advance. If you don't receive a letter, please call our office to schedule the follow-up appointment.  

## 2013-08-07 NOTE — Progress Notes (Signed)
Patient ID: Lindsay Doyle, female   DOB: June 21, 1933, 80 y.o.   MRN: [id number] PCP: Dr. Jenny Reichmann  80 yo with history of paroxysmal atrial fibrillation presents for cardiology followup.  She was admitted in 1/14 with NSTEMI in the setting of AFib with RVR. She spontaneously converted to NSR. LHC demonstrated non-obstructive disease and she was felt to have had demand ischemia. Eliquis 2.5 mg bid was added due to stroke risk. Echo 05/20/12: EF 60-65%.  Lindsay Doyle was admitted in 6/14 to [facility] with anemia.  1/2 stool guaiacs positive.  She required transfusion. Eliquis was stopped due to concern that it triggered GI bleeding.  No scopes were done. She saw [name] back in followup and had a long discussion about anticoagulation.  Ultimately, she decided not to restart anticoagulation.    Recently, she has been stable.  She is in NSR today.  No significant tachypalpitations. No chest pain or exertional dyspnea.  No melena or BRBPR.  Main problem has been arthritis pain.  At her last visit, BP was very high.  This may have been due to arthritis pain.  Her BP is on the low side today.    Labs (3/14): K 4.2, creatinine 1.0, LDL 84, HDL 82 Labs (10/14): K 3.9, creatinine 0.9, HCT 39.6, TSH normal, LDL 95, HDL 88 Labs (4/15): K 5.1, creatinine 0.9, LDL 95, HDL 71  ECG: NSR, LVH  PMH: 1. Hyperlipidemia 2. Anemia: Fe deficiency.  Presumed GI bleed requiring transfusion in 6/14, anticoagulation stopped.  3. HTN: Tolerates HCTZ poorly. 4. Osteoarthritis 5. H/o colonic polyps 6. Osteoporosis 7. GI bleed (3/12, 6/14): 3 units PRBCs, EGD with mild gastritis, colonoscopy negative, capsule endoscopy with nonspecific inflammatory changes.  6/14 GI bleeding (1/2 guaiac +), required transfusion.  8. CAD: LHC (1/14) with serial 30% and 60% LAD stenoses, 30% ostial RCA, EF 60%.  9. Paroxysmal atrial fibrillation.  Echo (2/14) with EF 60-65%, mild focal basal septal hypertrophy, mild AI.  SH: Married, lives with  husband in [place].  Has children living out of town.  Nonsmoker.  Retired.   FH: CAD  Current Outpatient Prescriptions  Medication Sig Dispense Refill  . acetaminophen (TYLENOL) 500 MG tablet Take 1,000 mg by mouth daily as needed for mild pain. For pain/fever      . aspirin EC 81 MG tablet Take 1 tablet (81 mg total) by mouth daily.      [name] atorvastatin (LIPITOR) 20 MG tablet TAKE 1 TABLET BY MOUTH EVERY DAY AT 6PM  90 tablet  1  . b complex vitamins tablet Take 1 tablet by mouth 2 (two) times a week.       . Calcium Carbonate-Vitamin D (CALTRATE 600+D) 600-400 MG-UNIT per tablet Take 1 tablet by mouth daily.        . fluticasone (CUTIVATE) 0.05 % cream Apply 1 application topically 2 (two) times daily as needed (for leg rash).      . Iron 66 MG TABS Take 66 mg by mouth every other day.       . metoprolol succinate (TOPROL-XL) 50 MG 24 hr tablet TAKE 1 TABLET (50 MG TOTAL) BY MOUTH DAILY.  90 tablet  3  . Multiple Vitamin (MULTIVITAMIN) tablet Take 1 tablet by mouth daily.        . nitroGLYCERIN (NITROSTAT) 0.4 MG SL tablet Place 0.4 mg under the tongue every 5 (five) minutes as needed for chest pain.      [name] tacrolimus (PROTOPIC) 0.1 % ointment Apply  1 application topically 2 (two) times daily.       No current facility-administered medications for this visit.    BP 107/72  Pulse 81  Ht 5\' 4"  (1.626 m)  Wt 51.256 kg (113 lb)  BMI 19.39 kg/m2 General: NAD Neck: No JVD, no thyromegaly or thyroid nodule.  Lungs: Clear to auscultation bilaterally with normal respiratory effort. CV: Nondisplaced PMI.  Heart regular S1/S2, no S3/S4, no murmur.  No peripheral edema.  No carotid bruit.  Normal pedal pulses.  Abdomen: Soft, nontender, no hepatosplenomegaly, no distention.  Neurologic: Alert and oriented x 3.  Psych: Normal affect. Extremities: No clubbing or cyanosis.   Assessment/Plan:  1. Paroxysmal atrial fibrillation: No recent tachypalpitations.  She is in NSR today.  Continue  Toprol XL.  Given her recurrent GI bleeding episodes of uncertain etiology (and requiring transfusion), will hold off on anticoagulation.  2. CAD: Nonobstructive.  No chest pain.  Suspect elevated troponin in 1/14 episode was due to demand ischemia from atrial fibrillation with RVR.  Continue statin and ASA 81.   3. HTN: BP control has been reasonable.   Followup in 6 months.   Larey Dresser 08/07/2013

## 2013-09-21 ENCOUNTER — Ambulatory Visit (INDEPENDENT_AMBULATORY_CARE_PROVIDER_SITE_OTHER): Payer: Medicare Other | Admitting: Internal Medicine

## 2013-09-21 ENCOUNTER — Encounter: Payer: Self-pay | Admitting: Internal Medicine

## 2013-09-21 VITALS — BP 122/80 | HR 106 | Temp 97.9°F | Wt 115.2 lb

## 2013-09-21 DIAGNOSIS — D509 Iron deficiency anemia, unspecified: Secondary | ICD-10-CM

## 2013-09-21 DIAGNOSIS — R5381 Other malaise: Secondary | ICD-10-CM | POA: Diagnosis not present

## 2013-09-21 DIAGNOSIS — F411 Generalized anxiety disorder: Secondary | ICD-10-CM

## 2013-09-21 DIAGNOSIS — I2510 Atherosclerotic heart disease of native coronary artery without angina pectoris: Secondary | ICD-10-CM | POA: Diagnosis not present

## 2013-09-21 DIAGNOSIS — R5383 Other fatigue: Secondary | ICD-10-CM

## 2013-09-21 DIAGNOSIS — I10 Essential (primary) hypertension: Secondary | ICD-10-CM

## 2013-09-21 DIAGNOSIS — R195 Other fecal abnormalities: Secondary | ICD-10-CM | POA: Diagnosis not present

## 2013-09-21 DIAGNOSIS — J45909 Unspecified asthma, uncomplicated: Secondary | ICD-10-CM

## 2013-09-21 NOTE — Progress Notes (Signed)
Pre visit review using our clinic review tool, if applicable. No additional management support is needed unless otherwise documented below in the visit note. 

## 2013-09-21 NOTE — Patient Instructions (Signed)
OK to increase the iron to one pill per day  Please continue all other medications as before, and refills have been done if requested.  Please have the pharmacy call with any other refills you may need.  Please continue your efforts at being more active, low cholesterol diet, and weight control.  You are otherwise up to date with prevention measures today.  Please keep your appointments with your specialists as you may have planned  Please go to the LAB in the Basement (turn left off the elevator) for the tests to be done tomorrow  You will be contacted by phone if any changes need to be made immediately.  Otherwise, you will receive a letter about your results with an explanation, but please check with MyChart first.  Please remember to sign up for MyChart if you have not done so, as this will be important to you in the future with finding out test results, communicating by private email, and scheduling acute appointments online when needed.

## 2013-09-21 NOTE — Progress Notes (Signed)
Subjective:    Patient ID: Lindsay Doyle, female    DOB: 22-Sep-1933, 80 y.o.   MRN: [id number]  HPI  Here to f/u, Does c/o ongoing fatigue, but denies signficant daytime hypersomnolence.   Pt denies fever, wt loss, night sweats, loss of appetite, or other constitutional symptoms  Denies worsening depressive symptoms, suicidal ideation, or panic.  Denies worsening reflux, abd pain, dysphagia, n/v, bowel change or blood.  Pt denies chest pain, increased sob or doe, wheezing, orthopnea, PND, increased LE swelling, palpitations, dizziness or syncope.  Has had several dark stools but taking iron otc.  Has chronic neck and LBP, taking tylenol prn.   Past Medical History  Diagnosis Date  . Hyperlipidemia   . Mitral valve prolapse     Remotely - last echo 2011 did not show this  . ANEMIA-IRON DEFICIENCY     presumed GI bleed 08/2012- anticoag stopped  . LEUKOPENIA, MILD 05/30/2008  . Hypertension 11/12/2006  . COPD 03/09/2007    ? pt is unsure  . OSTEOARTHRITIS 02/19/2009    Spinal  . OSTEOPOROSIS 11/12/2006  . COLONIC POLYPS, HX OF 03/09/2007  . HYPERGLYCEMIA 07/02/2007  . PSVT     a. Remotely, in setting of anemia.  [name] Positive H. pylori test 10/1996  . Cataract   . Blood transfusion     a. 05/2010: 3 transfusions for anemia/GIB.  [name] GI bleed     a. 05/2010: Hgb 7 at outside hospital s/p 3 u PRBC, EGD 05/2010 mild gastritis, small hiatal hernia, Schatzki ring, negative colonoscopy; capsule endoscopy 06/2010 - possible nonspecific inflammatory changes.;  b.  08/2012: 1/2 guiac +; required transfusion - Apixaban d/c'd  . Hx of echocardiogram     a. echo 2/11:  EF 55-60%, mild AI, mild RAE, mild to mod TR;   b.  a. Echo 2/14:  Mild focal basal and mild LVH, EF 60-65%, mild AI, mild RAE, trivia effusion  . NSTEMI (non-ST elevated myocardial infarction) 03/2012    a. Type II in setting of AF with RVR 03/2012  . Atrial fibrillation     a. Apixaban started 03/2012 - stopped after admx for GI bleed 6/14  . CAD  (coronary artery disease)     a. LHC 1/14:  mLAD serial 30 and 60%, oRCA 30%, EF 60%  . Spinal stenosis of lumbar region 07/16/2012   Past Surgical History  Procedure Laterality Date  . Abdominal hysterectomy    . Tonsillectomy    . Tubal ligation    . Cataract extraction    . Cardiac catheterization  1992    S/P in 1992 at [facility] in [place]. This was negative for any coronary artery disease  . Carotid duplex  08/29/2003  . Ceasarean      reports that she has never smoked. She has never used smokeless tobacco. She reports that she does not drink alcohol or use illicit drugs. family history includes Colon cancer in her brother; Sarcoidosis in her other and sister; Stomach cancer in her mother. There is no history of CAD. Allergies  Allergen Reactions  . Azithromycin Other (See Comments)    diarrhea  . Cefuroxime Axetil Other (See Comments)    diarrhea  . Clarithromycin Other (See Comments)    diarrhea  . Doxycycline   . Hydrocod Polst-Cpm Polst Er Other (See Comments)    Reaction unknown  . Metronidazole Other (See Comments)    Pt had difficulty swallowing this and says it was "  horrible" to take. No allergic reaction.  . Rofecoxib     REACTION: unspecified   Current Outpatient Prescriptions on File Prior to Visit  Medication Sig Dispense Refill  . acetaminophen (TYLENOL) 500 MG tablet Take 1,000 mg by mouth daily as needed for mild pain. For pain/fever      . aspirin EC 81 MG tablet Take 1 tablet (81 mg total) by mouth daily.      [name] atorvastatin (LIPITOR) 20 MG tablet TAKE 1 TABLET BY MOUTH EVERY DAY AT 6PM  90 tablet  1  . b complex vitamins tablet Take 1 tablet by mouth 2 (two) times a week.       . Calcium Carbonate-Vitamin D (CALTRATE 600+D) 600-400 MG-UNIT per tablet Take 1 tablet by mouth daily.        . fluticasone (CUTIVATE) 0.05 % cream Apply 1 application topically 2 (two) times daily as needed (for leg rash).      . metoprolol succinate  (TOPROL-XL) 50 MG 24 hr tablet TAKE 1 TABLET (50 MG TOTAL) BY MOUTH DAILY.  90 tablet  3  . Multiple Vitamin (MULTIVITAMIN) tablet Take 1 tablet by mouth daily.        . nitroGLYCERIN (NITROSTAT) 0.4 MG SL tablet Place 0.4 mg under the tongue every 5 (five) minutes as needed for chest pain.      [name] tacrolimus (PROTOPIC) 0.1 % ointment Apply 1 application topically 2 (two) times daily.       No current facility-administered medications on file prior to visit.   Review of Systems  Constitutional: Negative for unusual diaphoresis or other sweats  HENT: Negative for ringing in ear Eyes: Negative for double vision or worsening visual disturbance.  Respiratory: Negative for choking and stridor.   Gastrointestinal: Negative for vomiting or other signifcant bowel change Genitourinary: Negative for hematuria or decreased urine volume.  Musculoskeletal: Negative for other MSK pain or swelling Skin: Negative for color change and worsening wound.  Neurological: Negative for tremors and numbness other than noted  Psychiatric/Behavioral: Negative for decreased concentration or agitation other than above       Objective:   Physical Exam BP 122/80  Pulse 106  Temp(Src) 97.9 F (36.6 C) (Oral)  Wt 115 lb 4 oz (52.277 kg)  SpO2 93% VS noted,  Constitutional: Pt appears well-developed, well-nourished.  HENT: Head: NCAT.  Right Ear: External ear normal.  Left Ear: External ear normal.  Eyes: . Pupils are equal, round, and reactive to light. Conjunctivae and EOM are normal Neck: Normal range of motion. Neck supple.  Cardiovascular: Normal rate and regular rhythm.   Pulmonary/Chest: Effort normal and breath sounds normal.  Abd:  Soft, NT, ND, + BS Neurological: Pt is alert. Not confused , motor grossly intact Skin: Skin is warm. No rash Psychiatric: Pt behavior is normal. No agitation. mild nervous    Assessment & Plan:

## 2013-09-22 ENCOUNTER — Other Ambulatory Visit (INDEPENDENT_AMBULATORY_CARE_PROVIDER_SITE_OTHER): Payer: Medicare Other

## 2013-09-22 ENCOUNTER — Telehealth: Payer: Self-pay | Admitting: Internal Medicine

## 2013-09-22 DIAGNOSIS — R5381 Other malaise: Secondary | ICD-10-CM | POA: Diagnosis not present

## 2013-09-22 DIAGNOSIS — R5383 Other fatigue: Principal | ICD-10-CM

## 2013-09-22 DIAGNOSIS — K922 Gastrointestinal hemorrhage, unspecified: Secondary | ICD-10-CM

## 2013-09-22 DIAGNOSIS — D509 Iron deficiency anemia, unspecified: Secondary | ICD-10-CM

## 2013-09-22 LAB — CBC WITH DIFFERENTIAL/PLATELET
Basophils Absolute: 0 10*3/uL (ref 0.0–0.1)
Basophils Relative: 0.6 % (ref 0.0–3.0)
EOS PCT: 2.7 % (ref 0.0–5.0)
Eosinophils Absolute: 0.2 10*3/uL (ref 0.0–0.7)
HCT: 23 % — CL (ref 36.0–46.0)
Hemoglobin: 7.7 g/dL — CL (ref 12.0–15.0)
LYMPHS PCT: 34.8 % (ref 12.0–46.0)
Lymphs Abs: 2.6 10*3/uL (ref 0.7–4.0)
MCHC: 33.4 g/dL (ref 30.0–36.0)
MCV: 95.1 fl (ref 78.0–100.0)
Monocytes Absolute: 0.8 10*3/uL (ref 0.1–1.0)
Monocytes Relative: 10.6 % (ref 3.0–12.0)
NEUTROS ABS: 3.8 10*3/uL (ref 1.4–7.7)
NEUTROS PCT: 51.3 % (ref 43.0–77.0)
Platelets: 290 10*3/uL (ref 150.0–400.0)
RBC: 2.42 Mil/uL — AB (ref 3.87–5.11)
RDW: 14.2 % (ref 11.5–15.5)
WBC: 7.5 10*3/uL (ref 4.0–10.5)

## 2013-09-22 LAB — URINALYSIS, ROUTINE W REFLEX MICROSCOPIC
Bilirubin Urine: NEGATIVE
Ketones, ur: NEGATIVE
Leukocytes, UA: NEGATIVE
Nitrite: NEGATIVE
TOTAL PROTEIN, URINE-UPE24: NEGATIVE
URINE GLUCOSE: NEGATIVE
Urobilinogen, UA: 0.2 (ref 0.0–1.0)
pH: 7 (ref 5.0–8.0)

## 2013-09-22 LAB — IBC PANEL
Iron: 41 ug/dL — ABNORMAL LOW (ref 42–145)
Saturation Ratios: 13.2 % — ABNORMAL LOW (ref 20.0–50.0)
TRANSFERRIN: 221.3 mg/dL (ref 212.0–360.0)

## 2013-09-22 LAB — HEPATIC FUNCTION PANEL
ALBUMIN: 3.5 g/dL (ref 3.5–5.2)
ALT: 16 U/L (ref 0–35)
AST: 23 U/L (ref 0–37)
Alkaline Phosphatase: 49 U/L (ref 39–117)
BILIRUBIN DIRECT: 0.1 mg/dL (ref 0.0–0.3)
Total Bilirubin: 0.4 mg/dL (ref 0.2–1.2)
Total Protein: 6.6 g/dL (ref 6.0–8.3)

## 2013-09-22 LAB — BASIC METABOLIC PANEL
BUN: 15 mg/dL (ref 6–23)
CALCIUM: 9.1 mg/dL (ref 8.4–10.5)
CO2: 30 meq/L (ref 19–32)
CREATININE: 0.8 mg/dL (ref 0.4–1.2)
Chloride: 100 mEq/L (ref 96–112)
GFR: 84.28 mL/min (ref >60.00)
GLUCOSE: 104 mg/dL — AB (ref 70–99)
Potassium: 4.6 mEq/L (ref 3.5–5.1)
Sodium: 135 mEq/L (ref 135–145)

## 2013-09-22 LAB — TSH: TSH: 2.18 u[IU]/mL (ref 0.35–4.50)

## 2013-09-22 NOTE — Telephone Encounter (Signed)
Spoke to pt regarding severe iron def anemia, and probable recent LGI bleed as cause for her fatigue.   To increase the iron sulfate 325 to twice per day (three if tolerated)  Cont to hold asa for now  For f/u cbc tues June 30  Refer GI (urgent)

## 2013-09-23 ENCOUNTER — Ambulatory Visit: Payer: Medicare Other | Admitting: Gastroenterology

## 2013-09-23 ENCOUNTER — Encounter: Payer: Self-pay | Admitting: Gastroenterology

## 2013-09-23 ENCOUNTER — Telehealth: Payer: Self-pay | Admitting: *Deleted

## 2013-09-23 ENCOUNTER — Telehealth: Payer: Self-pay | Admitting: Gastroenterology

## 2013-09-23 VITALS — BP 126/74 | HR 100 | Ht 61.5 in | Wt 114.2 lb

## 2013-09-23 DIAGNOSIS — D62 Acute posthemorrhagic anemia: Secondary | ICD-10-CM

## 2013-09-23 NOTE — Telephone Encounter (Signed)
Pt scheduled to see Dr. Deatra Ina today at 2:45pm. Pt aware.

## 2013-09-23 NOTE — Assessment & Plan Note (Addendum)
Patient has had another overt GI bleed with acute blood loss anemia.  I suspect that she has small bowel AVMs.  There are no obvious precipitating factors.  Recommendations #1.  CBC in 3 days.  Patient has not had a bowel movement over the past 24 hours.  She was carefully instructed to go to the ED over the weekend should she start passing multiple black stools or develops lightheadedness or severe weakness; transfuse if hemoglobin drops below 7-7.5 #2 small bowel enteroscopy #3 continue iron supplementation

## 2013-09-23 NOTE — Patient Instructions (Addendum)
Come in on Monday to have your labs drawn (CBC)  You have a small bowel enteroscopy scheduled on 10/10/2013 at 12:15pm Please arrive at [facility] endoscopy at 11am

## 2013-09-23 NOTE — Progress Notes (Signed)
_                                                                                                                History of Present Illness: 80 year old Afro-American female referred for evaluation of anemia.  Approximately a week ago she passed 3 dark semi-formed stools.  She was having about 3 bowel movements a day until the last couple days.  She's had none in last 24 hours.  She's on no gastric irritants except for an 81 mg aspirin tablet.  She had a similar presentation in 2012 requiring blood transfusions.  Upper and lower endoscopy and capsule endoscopy were not diagnostic for a GI bleeding source.  This showed a hemoglobin was 7.7.  2 months ago it was 12.1.  BUN was normal.  Patient complains of weakness and generalized aching.  She is without abdominal pain or lightheadedness.    Past Medical History  Diagnosis Date  . Hyperlipidemia   . Mitral valve prolapse     Remotely - last echo 2011 did not show this  . ANEMIA-IRON DEFICIENCY     presumed GI bleed 08/2012- anticoag stopped  . LEUKOPENIA, MILD 05/30/2008  . Hypertension 11/12/2006  . COPD 03/09/2007    ? pt is unsure  . OSTEOARTHRITIS 02/19/2009    Spinal  . OSTEOPOROSIS 11/12/2006  . COLONIC POLYPS, HX OF 03/09/2007  . HYPERGLYCEMIA 07/02/2007  . PSVT     a. Remotely, in setting of anemia.  [name] Positive H. pylori test 10/1996  . Cataract   . Blood transfusion     a. 05/2010: 3 transfusions for anemia/GIB.  [name] GI bleed     a. 05/2010: Hgb 7 at outside hospital s/p 3 u PRBC, EGD 05/2010 mild gastritis, small hiatal hernia, Schatzki ring, negative colonoscopy; capsule endoscopy 06/2010 - possible nonspecific inflammatory changes.;  b.  08/2012: 1/2 guiac +; required transfusion - Apixaban d/c'd  . Hx of echocardiogram     a. echo 2/11:  EF 55-60%, mild AI, mild RAE, mild to mod TR;   b.  a. Echo 2/14:  Mild focal basal and mild LVH, EF 60-65%, mild AI, mild RAE, trivia effusion  . NSTEMI (non-ST elevated myocardial  infarction) 03/2012    a. Type II in setting of AF with RVR 03/2012  . Atrial fibrillation     a. Apixaban started 03/2012 - stopped after admx for GI bleed 6/14  . CAD (coronary artery disease)     a. LHC 1/14:  mLAD serial 30 and 60%, oRCA 30%, EF 60%  . Spinal stenosis of lumbar region 07/16/2012   Past Surgical History  Procedure Laterality Date  . Abdominal hysterectomy    . Tonsillectomy    . Tubal ligation    . Cataract extraction    . Cardiac catheterization  1992    S/P in 1992 at [facility] in [place]. This was negative for any coronary artery disease  . Carotid duplex  08/29/2003  . Ceasarean  family history includes Colon cancer in her brother; Sarcoidosis in her other and sister; Stomach cancer in her mother. There is no history of CAD. Current Outpatient Prescriptions  Medication Sig Dispense Refill  . acetaminophen (TYLENOL) 500 MG tablet Take 1,000 mg by mouth daily as needed for mild pain. For pain/fever      . aspirin EC 81 MG tablet Take 1 tablet (81 mg total) by mouth daily.      [name] atorvastatin (LIPITOR) 20 MG tablet TAKE 1 TABLET BY MOUTH EVERY DAY AT 6PM  90 tablet  1  . b complex vitamins tablet Take 1 tablet by mouth 2 (two) times a week.       . Calcium Carbonate-Vitamin D (CALTRATE 600+D) 600-400 MG-UNIT per tablet Take 1 tablet by mouth daily.        . ferrous sulfate 325 (65 FE) MG tablet Take 325 mg by mouth 2 (two) times daily with a meal.      . fluticasone (CUTIVATE) 0.05 % cream Apply 1 application topically 2 (two) times daily as needed (for leg rash).      . metoprolol succinate (TOPROL-XL) 50 MG 24 hr tablet TAKE 1 TABLET (50 MG TOTAL) BY MOUTH DAILY.  90 tablet  3  . Multiple Vitamin (MULTIVITAMIN) tablet Take 1 tablet by mouth daily.        . nitroGLYCERIN (NITROSTAT) 0.4 MG SL tablet Place 0.4 mg under the tongue every 5 (five) minutes as needed for chest pain.      [name] tacrolimus (PROTOPIC) 0.1 % ointment Apply 1 application  topically 2 (two) times daily.       No current facility-administered medications for this visit.   Allergies as of 09/23/2013 - Review Complete 09/23/2013  Allergen Reaction Noted  . Azithromycin Other (See Comments) 01/18/2007  . Cefuroxime axetil Other (See Comments) 01/18/2007  . Clarithromycin Other (See Comments) 01/18/2007  . Doxycycline  03/23/2013  . Hydrocod polst-cpm polst er Other (See Comments) 01/18/2007  . Metronidazole Other (See Comments) 10/08/2011  . Rofecoxib  08/20/2006    reports that she has never smoked. She has never used smokeless tobacco. She reports that she does not drink alcohol or use illicit drugs.     Review of Systems: Pertinent positive and negative review of systems were noted in the above HPI section. All other review of systems were otherwise negative.  Vital signs were reviewed in today's medical record Physical Exam: General: Well developed , well nourished, no acute distress Skin: anicteric Head: Normocephalic and atraumatic Eyes:  sclerae anicteric, EOMI Ears: Normal auditory acuity Mouth: No deformity or lesions Neck: Supple, no masses or thyromegaly Lungs: Clear throughout to auscultation Heart: Regular rate and rhythm; no  rubs or bruits.  There is a 1/6 early systolic murmur Abdomen: Soft, non tender and non distended. No masses, hepatosplenomegaly or hernias noted. Normal Bowel sounds Rectal:deferred Musculoskeletal: Symmetrical with no gross deformities  Skin: No lesions on visible extremities Pulses:  Normal pulses noted Extremities: No clubbing, cyanosis, edema or deformities noted Neurological: Alert oriented x 4, grossly nonfocal Cervical Nodes:  No significant cervical adenopathy Inguinal Nodes: No significant inguinal adenopathy Psychological:  Alert and cooperative. Normal mood and affect  See Assessment and Plan under Problem List

## 2013-09-25 NOTE — Assessment & Plan Note (Signed)
unclaer etiology, cant r/o LGI bleed though on iron supplement as well, cont to monitor

## 2013-09-25 NOTE — Assessment & Plan Note (Signed)
stable overall by history and exam, doubt significant issue to cause her fatigue, and pt to continue medical treatment as before,  to f/u any worsening symptoms or concerns

## 2013-09-25 NOTE — Assessment & Plan Note (Signed)
stable overall by history and exam, recent data reviewed with pt, and pt to continue medical treatment as before,  to f/u any worsening symptoms or concerns SpO2 Readings from Last 3 Encounters:  09/21/13 93%  07/26/13 92%  07/08/13 97%

## 2013-09-25 NOTE — Assessment & Plan Note (Signed)
stable overall by history and exam, recent data reviewed with pt, and pt to continue medical treatment as before,  to f/u any worsening symptoms or concerns BP Readings from Last 3 Encounters:  09/23/13 126/74  09/21/13 122/80  08/05/13 107/72

## 2013-09-26 ENCOUNTER — Other Ambulatory Visit: Payer: Self-pay

## 2013-09-26 ENCOUNTER — Other Ambulatory Visit (INDEPENDENT_AMBULATORY_CARE_PROVIDER_SITE_OTHER): Payer: Medicare Other

## 2013-09-26 ENCOUNTER — Telehealth: Payer: Self-pay

## 2013-09-26 ENCOUNTER — Encounter (HOSPITAL_COMMUNITY): Payer: Self-pay | Admitting: Pharmacy Technician

## 2013-09-26 ENCOUNTER — Encounter (HOSPITAL_COMMUNITY)
Admission: RE | Admit: 2013-09-26 | Discharge: 2013-09-26 | Disposition: A | Discharge status: Home / Self Care | Payer: Medicare Other | Source: Ambulatory Visit | Attending: Gastroenterology | Admitting: Gastroenterology

## 2013-09-26 ENCOUNTER — Telehealth: Payer: Self-pay | Admitting: Cardiology

## 2013-09-26 ENCOUNTER — Encounter (HOSPITAL_COMMUNITY): Payer: Medicare Other

## 2013-09-26 DIAGNOSIS — D62 Acute posthemorrhagic anemia: Secondary | ICD-10-CM | POA: Diagnosis not present

## 2013-09-26 DIAGNOSIS — D500 Iron deficiency anemia secondary to blood loss (chronic): Secondary | ICD-10-CM

## 2013-09-26 LAB — CBC WITH DIFFERENTIAL/PLATELET
BASOS PCT: 0.6 % (ref 0.0–3.0)
Basophils Absolute: 0 10*3/uL (ref 0.0–0.1)
EOS PCT: 3.2 % (ref 0.0–5.0)
Eosinophils Absolute: 0.2 10*3/uL (ref 0.0–0.7)
HCT: 22.3 % — CL (ref 36.0–46.0)
Hemoglobin: 7.4 g/dL — CL (ref 12.0–15.0)
LYMPHS ABS: 2.7 10*3/uL (ref 0.7–4.0)
Lymphocytes Relative: 38 % (ref 12.0–46.0)
MCHC: 33.2 g/dL (ref 30.0–36.0)
MCV: 97.6 fl (ref 78.0–100.0)
MONO ABS: 0.6 10*3/uL (ref 0.1–1.0)
Monocytes Relative: 8.7 % (ref 3.0–12.0)
Neutro Abs: 3.6 10*3/uL (ref 1.4–7.7)
Neutrophils Relative %: 49.5 % (ref 43.0–77.0)
Platelets: 357 10*3/uL (ref 150.0–400.0)
RDW: 15.4 % (ref 11.5–15.5)
WBC: 7.2 10*3/uL (ref 4.0–10.5)

## 2013-09-26 NOTE — Telephone Encounter (Signed)
Pt scheduled for 2 units blood at [facility] out pt 09/27/13@8 :30am. Pt states she is going today for type and cross. Pt aware of appt. And order entered.

## 2013-09-26 NOTE — Telephone Encounter (Signed)
ERROR

## 2013-09-26 NOTE — Telephone Encounter (Signed)
Pt was concerned that she is getting the blood outpt, states that she has afib and needs a monitor. Spoke with pts cardiologist Dr. [name] and per Dr. [name] pt does not have to be on a monitor for transfusion. Pt aware.

## 2013-09-26 NOTE — Telephone Encounter (Signed)
Message copied by Algernon Huxley on Mon Sep 26, 2013  1:00 PM ------      Message from: [place], [place] S      Created: Mon Sep 26, 2013 12:17 PM       LInda- hgb down to 7.4 today- please set up for outpt transfusion x 2 units tomorrow or Wednesday, then need to repeat hgb thursday      ----- Message -----         From: Inda Castle, MD         Sent: 09/23/2013   3:18 PM           To: Amy S Esterwood, PA-C, Oda Kilts, CMA            Amy, this patient has had a subacute GI bleed.  She had a similar presentation in 2012.  Hemoglobin on Thursday was 7.7.  She's had no bowel movement for 24 hours and she is taking iron.  I have ordered another CBC from Monday.  Please check.  If hemoglobin drops below 7.5 or so she will need a transfusion.       ------

## 2013-09-27 ENCOUNTER — Other Ambulatory Visit: Payer: Self-pay

## 2013-09-27 ENCOUNTER — Ambulatory Visit (HOSPITAL_COMMUNITY)
Admission: RE | Admit: 2013-09-27 | Discharge: 2013-09-27 | Disposition: A | Discharge status: Home / Self Care | Payer: Medicare Other | Source: Ambulatory Visit | Attending: Gastroenterology | Admitting: Gastroenterology

## 2013-09-27 ENCOUNTER — Encounter (HOSPITAL_COMMUNITY): Payer: Self-pay

## 2013-09-27 VITALS — BP 134/77 | HR 91 | Temp 97.4°F | Resp 16 | Ht 61.0 in | Wt 114.0 lb

## 2013-09-27 DIAGNOSIS — D649 Anemia, unspecified: Secondary | ICD-10-CM

## 2013-09-27 DIAGNOSIS — D62 Acute posthemorrhagic anemia: Secondary | ICD-10-CM | POA: Diagnosis not present

## 2013-09-27 DIAGNOSIS — D500 Iron deficiency anemia secondary to blood loss (chronic): Secondary | ICD-10-CM

## 2013-09-27 LAB — ABO/RH: ABO/RH(D): B NEG

## 2013-09-27 LAB — PREPARE RBC (CROSSMATCH)

## 2013-09-27 MED ORDER — DIPHENHYDRAMINE HCL 25 MG PO CAPS
50.0000 mg | ORAL_CAPSULE | Freq: Once | ORAL | Status: AC
  Administered 2013-09-27: 50 mg via ORAL
  Filled 2013-09-27 (×2): qty 2

## 2013-09-27 MED ORDER — ACETAMINOPHEN 325 MG PO TABS
650.0000 mg | ORAL_TABLET | Freq: Once | ORAL | Status: AC
  Administered 2013-09-27: 650 mg via ORAL
  Filled 2013-09-27: qty 2

## 2013-09-27 MED ORDER — SODIUM CHLORIDE 0.9 % IV SOLN
Freq: Once | INTRAVENOUS | Status: AC
  Administered 2013-09-27: 250 mL via INTRAVENOUS

## 2013-09-27 NOTE — Discharge Instructions (Signed)
Per Lindsay Sack RN at Dr Guillermina [place] office: Lindsay Doyle GO TO [facility] FOR LAB WORK ON Thursday 09/29/2013 BETWEEN 7:30 AM - 5PM FOR LAB WORK.  Lindsay Doyle (Nurse at [facility]) sent a computer message to Dr Gwynn Burly office 09/27/13 at 10:30 am to let them know you would not be coming to their offcie today for lab work and that you would be going on Thursday for lab work at [facility].    Blood Transfusion  A blood transfusion replaces your blood or some of its parts. Blood is replaced when you have lost blood because of surgery, an accident, or for severe blood conditions like anemia. You can donate blood to be used on yourself if you have a planned surgery. If you lose blood during that surgery, your own blood can be given back to you. Any blood given to you is checked to make sure it matches your blood type. Your temperature, blood pressure, and heart rate (vital signs) will be checked often.  GET HELP RIGHT AWAY IF:   You feel sick to your stomach (nauseous) or throw up (vomit).  You have watery poop (diarrhea).  You have shortness of breath or trouble breathing.  You have blood in your pee (urine) or have dark colored pee.  You have chest pain or tightness.  Your eyes or skin turn yellow (jaundice).  You have a temperature by mouth above 102 F (38.9 C), not controlled by medicine.  You start to shake and have chills.  You develop a a red rash (hives) or feel itchy.  You develop lightheadedness or feel confused.  You develop back, joint, or muscle pain.  You do not feel hungry (lost appetite).  You feel tired, restless, or nervous.  You develop belly (abdominal) cramps. Document Released: 06/13/2008 Document Revised: 06/09/2011 Document Reviewed: 06/13/2008 [facility] Patient Information 2015 [place], [place]. This information is not intended to replace advice given to you by your health care provider. Make sure you discuss any questions you have with your health care provider.  -

## 2013-09-28 ENCOUNTER — Telehealth: Payer: Self-pay | Admitting: Gastroenterology

## 2013-09-28 ENCOUNTER — Encounter (HOSPITAL_COMMUNITY): Payer: Self-pay | Admitting: *Deleted

## 2013-09-28 LAB — TYPE AND SCREEN
ABO/RH(D): B NEG
Antibody Screen: NEGATIVE
UNIT DIVISION: 0
Unit division: 0

## 2013-09-28 NOTE — Telephone Encounter (Signed)
Returned patients call, she wanted to make sure she was going to be ok for her family trip she has planned on 7/13  I told her she would be fine and she should not reschedule her hospital Enteroscopy   Patient is c/o of lower left side abdominal discomfort intermittently with bowel movements , wants to know what she should do??  Dr Carlean Purl you are doc of the day so im sending this message to you   She is scheduled for a small bowel enteroscopy on 7/13 at [facility] with Dr Deatra Ina

## 2013-09-28 NOTE — Telephone Encounter (Signed)
Spoke with patient about appointment and went over instructions

## 2013-09-28 NOTE — Telephone Encounter (Signed)
unless the pains are severe would monitor and call back if worse

## 2013-09-29 ENCOUNTER — Telehealth: Payer: Self-pay | Admitting: Cardiology

## 2013-09-29 ENCOUNTER — Other Ambulatory Visit (INDEPENDENT_AMBULATORY_CARE_PROVIDER_SITE_OTHER): Payer: Medicare Other

## 2013-09-29 ENCOUNTER — Other Ambulatory Visit: Payer: Self-pay | Admitting: *Deleted

## 2013-09-29 DIAGNOSIS — D508 Other iron deficiency anemias: Secondary | ICD-10-CM

## 2013-09-29 DIAGNOSIS — D649 Anemia, unspecified: Secondary | ICD-10-CM | POA: Diagnosis not present

## 2013-09-29 LAB — CBC WITH DIFFERENTIAL/PLATELET
BASOS PCT: 0.4 % (ref 0.0–3.0)
Basophils Absolute: 0 10*3/uL (ref 0.0–0.1)
Eosinophils Absolute: 0.2 10*3/uL (ref 0.0–0.7)
Eosinophils Relative: 2.2 % (ref 0.0–5.0)
HCT: 34.1 % — ABNORMAL LOW (ref 36.0–46.0)
Hemoglobin: 11.4 g/dL — ABNORMAL LOW (ref 12.0–15.0)
Lymphocytes Relative: 20.8 % (ref 12.0–46.0)
Lymphs Abs: 1.5 10*3/uL (ref 0.7–4.0)
MCHC: 33.3 g/dL (ref 30.0–36.0)
MCV: 86.4 fl (ref 78.0–100.0)
MONOS PCT: 11.3 % (ref 3.0–12.0)
Monocytes Absolute: 0.8 10*3/uL (ref 0.1–1.0)
NEUTROS PCT: 65.3 % (ref 43.0–77.0)
Neutro Abs: 4.6 10*3/uL (ref 1.4–7.7)
PLATELETS: 331 10*3/uL (ref 150.0–400.0)
RBC: 3.95 Mil/uL (ref 3.87–5.11)
RDW: 25.5 % — ABNORMAL HIGH (ref 11.5–15.5)
WBC: 7.1 10*3/uL (ref 4.0–10.5)

## 2013-09-29 NOTE — Telephone Encounter (Signed)
Spoke with joy, aware pt is only on asa for atrial fib. She does not require antibiotics prior to procedure. They are going to fax something for [place] to fill out and fax back.

## 2013-09-29 NOTE — Telephone Encounter (Signed)
New message    Does patient need pre med before dental work . Patient in chair now.

## 2013-10-03 NOTE — Telephone Encounter (Signed)
L/M for pt that I left her procedure scheduled for the 13th  If pains worsen to contact the office

## 2013-10-10 ENCOUNTER — Ambulatory Visit (HOSPITAL_COMMUNITY)
Admission: RE | Admit: 2013-10-10 | Discharge: 2013-10-10 | Disposition: A | Discharge status: Home / Self Care | Payer: Medicare Other | Source: Ambulatory Visit | Attending: Gastroenterology | Admitting: Gastroenterology

## 2013-10-10 ENCOUNTER — Encounter (HOSPITAL_COMMUNITY)
Admission: RE | Disposition: A | Discharge status: Home / Self Care | Payer: Self-pay | Source: Ambulatory Visit | Attending: Gastroenterology

## 2013-10-10 ENCOUNTER — Encounter (HOSPITAL_COMMUNITY): Payer: Self-pay

## 2013-10-10 ENCOUNTER — Ambulatory Visit (HOSPITAL_COMMUNITY): Payer: Medicare Other | Admitting: Certified Registered Nurse Anesthetist

## 2013-10-10 ENCOUNTER — Encounter (HOSPITAL_COMMUNITY): Payer: Medicare Other | Admitting: Certified Registered Nurse Anesthetist

## 2013-10-10 DIAGNOSIS — K921 Melena: Secondary | ICD-10-CM | POA: Diagnosis not present

## 2013-10-10 DIAGNOSIS — Z7982 Long term (current) use of aspirin: Secondary | ICD-10-CM | POA: Diagnosis not present

## 2013-10-10 DIAGNOSIS — E785 Hyperlipidemia, unspecified: Secondary | ICD-10-CM | POA: Insufficient documentation

## 2013-10-10 DIAGNOSIS — D500 Iron deficiency anemia secondary to blood loss (chronic): Secondary | ICD-10-CM | POA: Diagnosis not present

## 2013-10-10 DIAGNOSIS — K31819 Angiodysplasia of stomach and duodenum without bleeding: Secondary | ICD-10-CM | POA: Insufficient documentation

## 2013-10-10 DIAGNOSIS — J4489 Other specified chronic obstructive pulmonary disease: Secondary | ICD-10-CM | POA: Diagnosis not present

## 2013-10-10 DIAGNOSIS — I252 Old myocardial infarction: Secondary | ICD-10-CM | POA: Diagnosis not present

## 2013-10-10 DIAGNOSIS — N183 Chronic kidney disease, stage 3 unspecified: Secondary | ICD-10-CM | POA: Diagnosis not present

## 2013-10-10 DIAGNOSIS — J449 Chronic obstructive pulmonary disease, unspecified: Secondary | ICD-10-CM | POA: Diagnosis not present

## 2013-10-10 DIAGNOSIS — I129 Hypertensive chronic kidney disease with stage 1 through stage 4 chronic kidney disease, or unspecified chronic kidney disease: Secondary | ICD-10-CM | POA: Insufficient documentation

## 2013-10-10 DIAGNOSIS — I2510 Atherosclerotic heart disease of native coronary artery without angina pectoris: Secondary | ICD-10-CM | POA: Insufficient documentation

## 2013-10-10 DIAGNOSIS — I4891 Unspecified atrial fibrillation: Secondary | ICD-10-CM | POA: Diagnosis not present

## 2013-10-10 DIAGNOSIS — M48061 Spinal stenosis, lumbar region without neurogenic claudication: Secondary | ICD-10-CM | POA: Diagnosis not present

## 2013-10-10 DIAGNOSIS — Z79899 Other long term (current) drug therapy: Secondary | ICD-10-CM | POA: Insufficient documentation

## 2013-10-10 DIAGNOSIS — D62 Acute posthemorrhagic anemia: Secondary | ICD-10-CM

## 2013-10-10 HISTORY — PX: ENTEROSCOPY: SHX5533

## 2013-10-10 SURGERY — ENTEROSCOPY
Anesthesia: Monitor Anesthesia Care

## 2013-10-10 MED ORDER — GLYCOPYRROLATE 0.2 MG/ML IJ SOLN
INTRAMUSCULAR | Status: DC | PRN
  Administered 2013-10-10: 0.2 mg via INTRAVENOUS

## 2013-10-10 MED ORDER — ONDANSETRON HCL 4 MG/2ML IJ SOLN
INTRAMUSCULAR | Status: DC | PRN
  Administered 2013-10-10: 4 mg via INTRAVENOUS

## 2013-10-10 MED ORDER — GLYCOPYRROLATE 0.2 MG/ML IJ SOLN
INTRAMUSCULAR | Status: AC
Start: 2013-10-10 — End: 2013-10-10
  Filled 2013-10-10: qty 1

## 2013-10-10 MED ORDER — ONDANSETRON HCL 4 MG/2ML IJ SOLN
INTRAMUSCULAR | Status: AC
  Filled 2013-10-10: qty 2

## 2013-10-10 MED ORDER — LIDOCAINE HCL (CARDIAC) 20 MG/ML IV SOLN
INTRAVENOUS | Status: AC
  Filled 2013-10-10: qty 5

## 2013-10-10 MED ORDER — KETAMINE HCL 10 MG/ML IJ SOLN
INTRAMUSCULAR | Status: DC | PRN
  Administered 2013-10-10: 20 mg via INTRAVENOUS

## 2013-10-10 MED ORDER — BUTAMBEN-TETRACAINE-BENZOCAINE 2-2-14 % EX AERO
INHALATION_SPRAY | CUTANEOUS | Status: DC | PRN
  Administered 2013-10-10: 2 via TOPICAL

## 2013-10-10 MED ORDER — LACTATED RINGERS IV SOLN
INTRAVENOUS | Status: DC
  Administered 2013-10-10: 10:00:00 via INTRAVENOUS

## 2013-10-10 MED ORDER — PROPOFOL 10 MG/ML IV BOLUS
INTRAVENOUS | Status: AC
Start: 2013-10-10 — End: 2013-10-10
  Filled 2013-10-10: qty 20

## 2013-10-10 MED ORDER — SODIUM CHLORIDE 0.9 % IV SOLN: INTRAVENOUS | Status: DC

## 2013-10-10 MED ORDER — LACTATED RINGERS IV SOLN: INTRAVENOUS | Status: DC

## 2013-10-10 MED ORDER — MIDAZOLAM HCL 5 MG/5ML IJ SOLN
INTRAMUSCULAR | Status: DC | PRN
  Administered 2013-10-10: 2 mg via INTRAVENOUS

## 2013-10-10 MED ORDER — OMEPRAZOLE 20 MG PO CPDR: 20.0000 mg | DELAYED_RELEASE_CAPSULE | Freq: Every day | ORAL | Status: DC

## 2013-10-10 MED ORDER — PANTOPRAZOLE SODIUM 40 MG PO TBEC
40.0000 mg | DELAYED_RELEASE_TABLET | Freq: Every day | ORAL | Status: DC
  Filled 2013-10-10 (×2): qty 1

## 2013-10-10 MED ORDER — PROPOFOL INFUSION 10 MG/ML OPTIME
INTRAVENOUS | Status: DC | PRN
  Administered 2013-10-10: 120 ug/kg/min via INTRAVENOUS

## 2013-10-10 MED ORDER — LIDOCAINE HCL (CARDIAC) 20 MG/ML IV SOLN
INTRAVENOUS | Status: DC | PRN
  Administered 2013-10-10: 100 mg via INTRAVENOUS

## 2013-10-10 MED ORDER — MIDAZOLAM HCL 2 MG/2ML IJ SOLN
INTRAMUSCULAR | Status: AC
  Filled 2013-10-10: qty 2

## 2013-10-10 NOTE — H&P (View-Only) (Signed)
_                                                                                                                History of Present Illness: 80 year old Afro-American female referred for evaluation of anemia.  Approximately a week ago she passed 3 dark semi-formed stools.  She was having about 3 bowel movements a day until the last couple days.  She's had none in last 24 hours.  She's on no gastric irritants except for an 81 mg aspirin tablet.  She had a similar presentation in 2012 requiring blood transfusions.  Upper and lower endoscopy and capsule endoscopy were not diagnostic for a GI bleeding source.  This showed a hemoglobin was 7.7.  2 months ago it was 12.1.  BUN was normal.  Patient complains of weakness and generalized aching.  She is without abdominal pain or lightheadedness.    Past Medical History  Diagnosis Date  . Hyperlipidemia   . Mitral valve prolapse     Remotely - last echo 2011 did not show this  . ANEMIA-IRON DEFICIENCY     presumed GI bleed 08/2012- anticoag stopped  . LEUKOPENIA, MILD 05/30/2008  . Hypertension 11/12/2006  . COPD 03/09/2007    ? pt is unsure  . OSTEOARTHRITIS 02/19/2009    Spinal  . OSTEOPOROSIS 11/12/2006  . COLONIC POLYPS, HX OF 03/09/2007  . HYPERGLYCEMIA 07/02/2007  . PSVT     a. Remotely, in setting of anemia.  [name] Positive H. pylori test 10/1996  . Cataract   . Blood transfusion     a. 05/2010: 3 transfusions for anemia/GIB.  [name] GI bleed     a. 05/2010: Hgb 7 at outside hospital s/p 3 u PRBC, EGD 05/2010 mild gastritis, small hiatal hernia, Schatzki ring, negative colonoscopy; capsule endoscopy 06/2010 - possible nonspecific inflammatory changes.;  b.  08/2012: 1/2 guiac +; required transfusion - Apixaban d/c'd  . Hx of echocardiogram     a. echo 2/11:  EF 55-60%, mild AI, mild RAE, mild to mod TR;   b.  a. Echo 2/14:  Mild focal basal and mild LVH, EF 60-65%, mild AI, mild RAE, trivia effusion  . NSTEMI (non-ST elevated myocardial  infarction) 03/2012    a. Type II in setting of AF with RVR 03/2012  . Atrial fibrillation     a. Apixaban started 03/2012 - stopped after admx for GI bleed 6/14  . CAD (coronary artery disease)     a. LHC 1/14:  mLAD serial 30 and 60%, oRCA 30%, EF 60%  . Spinal stenosis of lumbar region 07/16/2012   Past Surgical History  Procedure Laterality Date  . Abdominal hysterectomy    . Tonsillectomy    . Tubal ligation    . Cataract extraction    . Cardiac catheterization  1992    S/P in 1992 at [facility] in [place]. This was negative for any coronary artery disease  . Carotid duplex  08/29/2003  . Ceasarean  family history includes Colon cancer in her brother; Sarcoidosis in her other and sister; Stomach cancer in her mother. There is no history of CAD. Current Outpatient Prescriptions  Medication Sig Dispense Refill  . acetaminophen (TYLENOL) 500 MG tablet Take 1,000 mg by mouth daily as needed for mild pain. For pain/fever      . aspirin EC 81 MG tablet Take 1 tablet (81 mg total) by mouth daily.      [name] atorvastatin (LIPITOR) 20 MG tablet TAKE 1 TABLET BY MOUTH EVERY DAY AT 6PM  90 tablet  1  . b complex vitamins tablet Take 1 tablet by mouth 2 (two) times a week.       . Calcium Carbonate-Vitamin D (CALTRATE 600+D) 600-400 MG-UNIT per tablet Take 1 tablet by mouth daily.        . ferrous sulfate 325 (65 FE) MG tablet Take 325 mg by mouth 2 (two) times daily with a meal.      . fluticasone (CUTIVATE) 0.05 % cream Apply 1 application topically 2 (two) times daily as needed (for leg rash).      . metoprolol succinate (TOPROL-XL) 50 MG 24 hr tablet TAKE 1 TABLET (50 MG TOTAL) BY MOUTH DAILY.  90 tablet  3  . Multiple Vitamin (MULTIVITAMIN) tablet Take 1 tablet by mouth daily.        . nitroGLYCERIN (NITROSTAT) 0.4 MG SL tablet Place 0.4 mg under the tongue every 5 (five) minutes as needed for chest pain.      [name] tacrolimus (PROTOPIC) 0.1 % ointment Apply 1 application  topically 2 (two) times daily.       No current facility-administered medications for this visit.   Allergies as of 09/23/2013 - Review Complete 09/23/2013  Allergen Reaction Noted  . Azithromycin Other (See Comments) 01/18/2007  . Cefuroxime axetil Other (See Comments) 01/18/2007  . Clarithromycin Other (See Comments) 01/18/2007  . Doxycycline  03/23/2013  . Hydrocod polst-cpm polst er Other (See Comments) 01/18/2007  . Metronidazole Other (See Comments) 10/08/2011  . Rofecoxib  08/20/2006    reports that she has never smoked. She has never used smokeless tobacco. She reports that she does not drink alcohol or use illicit drugs.     Review of Systems: Pertinent positive and negative review of systems were noted in the above HPI section. All other review of systems were otherwise negative.  Vital signs were reviewed in today's medical record Physical Exam: General: Well developed , well nourished, no acute distress Skin: anicteric Head: Normocephalic and atraumatic Eyes:  sclerae anicteric, EOMI Ears: Normal auditory acuity Mouth: No deformity or lesions Neck: Supple, no masses or thyromegaly Lungs: Clear throughout to auscultation Heart: Regular rate and rhythm; no  rubs or bruits.  There is a 1/6 early systolic murmur Abdomen: Soft, non tender and non distended. No masses, hepatosplenomegaly or hernias noted. Normal Bowel sounds Rectal:deferred Musculoskeletal: Symmetrical with no gross deformities  Skin: No lesions on visible extremities Pulses:  Normal pulses noted Extremities: No clubbing, cyanosis, edema or deformities noted Neurological: Alert oriented x 4, grossly nonfocal Cervical Nodes:  No significant cervical adenopathy Inguinal Nodes: No significant inguinal adenopathy Psychological:  Alert and cooperative. Normal mood and affect  See Assessment and Plan under Problem List

## 2013-10-10 NOTE — Interval H&P Note (Signed)
History and Physical Interval Note:  10/10/2013 11:03 AM  Lindsay Doyle  has presented today for surgery, with the diagnosis of Iron deficiency anemia due to chronic blood loss [280.0]  The various methods of treatment have been discussed with the patient and family. After consideration of risks, benefits and other options for treatment, the patient has consented to  Procedure(s): ENTEROSCOPY (N/A) as a surgical intervention .  The patient's history has been reviewed, patient examined, no change in status, stable for surgery.  I have reviewed the patient's chart and labs.  Questions were answered to the patient's satisfaction.    The recent H&P (dated *09/23/13**) was reviewed, the patient was examined and there is no change in the patients condition since that H&P was completed.   Erskine Emery  10/10/2013, 11:04 AM    Erskine Emery

## 2013-10-10 NOTE — Transfer of Care (Signed)
Immediate Anesthesia Transfer of Care Note  Patient: Lindsay Doyle  Procedure(s) Performed: Procedure(s): ENTEROSCOPY (N/A)  Patient Location: PACU and Endoscopy Unit  Anesthesia Type:MAC  Level of Consciousness: awake and responds to stimulation  Airway & Oxygen Therapy: Patient Spontanous Breathing and Patient connected to nasal cannula oxygen  Post-op Assessment: Report given to PACU RN and Post -op Vital signs reviewed and stable  Post vital signs: Reviewed and stable  Complications: No apparent anesthesia complications

## 2013-10-10 NOTE — Discharge Instructions (Signed)
Esophagogastroduodenoscopy °Care After °Refer to this sheet in the next few weeks. These instructions provide you with information on caring for yourself after your procedure. Your caregiver may also give you more specific instructions. Your treatment has been planned according to current medical practices, but problems sometimes occur. Call your caregiver if you have any problems or questions after your procedure.  °HOME CARE INSTRUCTIONS °· Do not eat or drink anything until the numbing medicine (local anesthetic) has worn off and your gag reflex has returned. You will know that the local anesthetic has worn off when you can swallow comfortably. °· Do not drive for 12 hours after the procedure or as directed by your caregiver. °· Only take medicines as directed by your caregiver. °SEEK MEDICAL CARE IF:  °· You cannot stop coughing. °· You are not urinating at all or less than usual. °SEEK IMMEDIATE MEDICAL CARE IF: °· You have difficulty swallowing. °· You cannot eat or drink. °· You have worsening throat or chest pain. °· You have dizziness, lightheadedness, or you faint. °· You have nausea or vomiting. °· You have chills. °· You have a fever. °· You have severe abdominal pain. °· You have black, tarry, or bloody stools. °Document Released: 03/03/2012 Document Reviewed: 03/03/2012 °[employer]® Patient Information ©2015 [employer]. This information is not intended to replace advice given to you by your health care provider. Make sure you discuss any questions you have with your health care provider. ° °

## 2013-10-10 NOTE — Anesthesia Preprocedure Evaluation (Addendum)
Anesthesia Evaluation  Patient identified by MRN, date of birth, ID band Patient awake    Reviewed: Allergy & Precautions, H&P , NPO status , Patient's Chart, lab work & pertinent test results, reviewed documented beta blocker date and time   Airway Mallampati: II TM Distance: >3 FB Neck ROM: full    Dental  (+) Missing, Dental Advisory Given Several missing upper and lower teeth:   Pulmonary asthma , COPD breath sounds clear to auscultation  Pulmonary exam normal       Cardiovascular Exercise Tolerance: Good hypertension, Pt. on home beta blockers + CAD and + Past MI + dysrhythmias Supra Ventricular Tachycardia + Valvular Problems/Murmurs Rhythm:regular Rate:Normal  NSTEMI 1/14   Neuro/Psych negative neurological ROS  negative psych ROS   GI/Hepatic negative GI ROS, Neg liver ROS,   Endo/Other  negative endocrine ROS  Renal/GU Renal diseaseStage 3 chronic kidney disease  negative genitourinary   Musculoskeletal   Abdominal   Peds  Hematology negative hematology ROS (+)   Anesthesia Other Findings   Reproductive/Obstetrics negative OB ROS                          Anesthesia Physical Anesthesia Plan  ASA: III  Anesthesia Plan: MAC   Post-op Pain Management:    Induction:   Airway Management Planned:   Additional Equipment:   Intra-op Plan:   Post-operative Plan:   Informed Consent: I have reviewed the patients History and Physical, chart, labs and discussed the procedure including the risks, benefits and alternatives for the proposed anesthesia with the patient or authorized representative who has indicated his/her understanding and acceptance.   Dental Advisory Given  Plan Discussed with: CRNA and Surgeon  Anesthesia Plan Comments:         Anesthesia Quick Evaluation

## 2013-10-10 NOTE — Anesthesia Postprocedure Evaluation (Signed)
  Anesthesia Post-op Note  Patient: Lindsay Doyle  Procedure(s) Performed: Procedure(s) (LRB): ENTEROSCOPY (N/A)  Patient Location: PACU  Anesthesia Type: MAC  Level of Consciousness: awake and alert   Airway and Oxygen Therapy: Patient Spontanous Breathing  Post-op Pain: mild  Post-op Assessment: Post-op Vital signs reviewed, Patient's Cardiovascular Status Stable, Respiratory Function Stable, Patent Airway and No signs of Nausea or vomiting  Last Vitals:  Filed Vitals:   10/10/13 1230  BP: 191/97  Pulse: 82  Temp:   Resp: 12    Post-op Vital Signs: stable   Complications: No apparent anesthesia complications

## 2013-10-11 ENCOUNTER — Encounter (HOSPITAL_COMMUNITY): Payer: Self-pay | Admitting: Gastroenterology

## 2013-10-12 ENCOUNTER — Telehealth: Payer: Self-pay

## 2013-10-12 NOTE — Op Note (Addendum)
[facility] [place], [address]   OPERATIVE PROCEDURE REPORT  PATIENT: Lindsay Doyle, Lindsay Doyle  MR#: [id number] BIRTHDATE: [birth year] , 80  yrs. old GENDER: Female ENDOSCOPIST: Inda Castle, MD REFERRED BY: PROCEDURE DATE: 10/10/2013 PROCEDURE:   Small bowel enteroscopy with ablation therapy ASA CLASS:   Class II INDICATIONS:1.  melenic bleeding. MEDICATIONS: MAC sedation, administered by CRNA TOPICAL ANESTHETIC:  DESCRIPTION OF PROCEDURE:   After the risks benefits and alternatives of the procedure were thoroughly explained, informed consent was obtained.  The Pentax VSB-2900  endoscope was introduced through the mouth  and advanced to the proximal jejunum jejunum , limited by Without limitations.   The instrument was slowly withdrawn as the mucosa was fully examined.    The duodenum and proximal jejunum were entirely normal.  In the gastric cardia 2 AVMs were identified measuring one and 3 mm, respectively.  AVMs were cauterized utilizing the argon plasma coagulator.  The remainder of the stomach and the esophagus were normal.  Retroflexed views revealed no abnormalities.    The scope was then withdrawn from the patient and the procedure terminated.  COMPLICATIONS: There were no complications. ENDOSCOPIC IMPRESSION:  gastric AVMs-status post cauterization and tissue ablation with the argon plasma coagulator  RECOMMENDATIONS: Followup CBC in approximately one month REPEAT EXAM:  _______________________________ eSignedInda Castle, MD 10/12/2013 10:14 AM   CC: Cathlean Cower, M.D.

## 2013-10-12 NOTE — Telephone Encounter (Signed)
Patient notified that she is due for lab work on 10/13/13. Per the patient and the husband they were told by Dr. Deatra Ina that labs in 1 month.  I spoke with Rosanne Sack, RN Dr. Kelby Fam nurse, she will clarify with Dr. Deatra Ina and procedure report for when lab is needed.  Orders are entered.

## 2013-10-20 ENCOUNTER — Inpatient Hospital Stay (HOSPITAL_COMMUNITY)
Admission: EM | Admit: 2013-10-20 | Discharge: 2013-10-23 | DRG: 683 | Disposition: A | Discharge status: Home / Self Care | Payer: Medicare Other | Attending: Internal Medicine | Admitting: Internal Medicine

## 2013-10-20 ENCOUNTER — Encounter (HOSPITAL_COMMUNITY): Payer: Self-pay | Admitting: Emergency Medicine

## 2013-10-20 ENCOUNTER — Emergency Department (HOSPITAL_COMMUNITY): Payer: Medicare Other

## 2013-10-20 ENCOUNTER — Telehealth: Payer: Self-pay | Admitting: Gastroenterology

## 2013-10-20 DIAGNOSIS — R5383 Other fatigue: Secondary | ICD-10-CM | POA: Diagnosis not present

## 2013-10-20 DIAGNOSIS — Z7982 Long term (current) use of aspirin: Secondary | ICD-10-CM

## 2013-10-20 DIAGNOSIS — E785 Hyperlipidemia, unspecified: Secondary | ICD-10-CM

## 2013-10-20 DIAGNOSIS — J4489 Other specified chronic obstructive pulmonary disease: Secondary | ICD-10-CM

## 2013-10-20 DIAGNOSIS — R5381 Other malaise: Secondary | ICD-10-CM | POA: Diagnosis present

## 2013-10-20 DIAGNOSIS — I2510 Atherosclerotic heart disease of native coronary artery without angina pectoris: Secondary | ICD-10-CM | POA: Diagnosis present

## 2013-10-20 DIAGNOSIS — D509 Iron deficiency anemia, unspecified: Secondary | ICD-10-CM | POA: Diagnosis present

## 2013-10-20 DIAGNOSIS — M48061 Spinal stenosis, lumbar region without neurogenic claudication: Secondary | ICD-10-CM

## 2013-10-20 DIAGNOSIS — J309 Allergic rhinitis, unspecified: Secondary | ICD-10-CM

## 2013-10-20 DIAGNOSIS — N951 Menopausal and female climacteric states: Secondary | ICD-10-CM

## 2013-10-20 DIAGNOSIS — R059 Cough, unspecified: Secondary | ICD-10-CM | POA: Diagnosis not present

## 2013-10-20 DIAGNOSIS — I471 Supraventricular tachycardia, unspecified: Secondary | ICD-10-CM

## 2013-10-20 DIAGNOSIS — N183 Chronic kidney disease, stage 3 unspecified: Secondary | ICD-10-CM

## 2013-10-20 DIAGNOSIS — Z800 Family history of malignant neoplasm of digestive organs: Secondary | ICD-10-CM | POA: Diagnosis not present

## 2013-10-20 DIAGNOSIS — J45909 Unspecified asthma, uncomplicated: Secondary | ICD-10-CM

## 2013-10-20 DIAGNOSIS — I252 Old myocardial infarction: Secondary | ICD-10-CM | POA: Diagnosis not present

## 2013-10-20 DIAGNOSIS — Q391 Atresia of esophagus with tracheo-esophageal fistula: Secondary | ICD-10-CM | POA: Diagnosis not present

## 2013-10-20 DIAGNOSIS — E876 Hypokalemia: Secondary | ICD-10-CM | POA: Diagnosis present

## 2013-10-20 DIAGNOSIS — M47812 Spondylosis without myelopathy or radiculopathy, cervical region: Secondary | ICD-10-CM

## 2013-10-20 DIAGNOSIS — I10 Essential (primary) hypertension: Secondary | ICD-10-CM | POA: Diagnosis not present

## 2013-10-20 DIAGNOSIS — K769 Liver disease, unspecified: Secondary | ICD-10-CM

## 2013-10-20 DIAGNOSIS — J449 Chronic obstructive pulmonary disease, unspecified: Secondary | ICD-10-CM

## 2013-10-20 DIAGNOSIS — M1990 Unspecified osteoarthritis, unspecified site: Secondary | ICD-10-CM

## 2013-10-20 DIAGNOSIS — Z0000 Encounter for general adult medical examination without abnormal findings: Secondary | ICD-10-CM

## 2013-10-20 DIAGNOSIS — Z885 Allergy status to narcotic agent status: Secondary | ICD-10-CM | POA: Diagnosis not present

## 2013-10-20 DIAGNOSIS — M542 Cervicalgia: Secondary | ICD-10-CM

## 2013-10-20 DIAGNOSIS — Z881 Allergy status to other antibiotic agents status: Secondary | ICD-10-CM

## 2013-10-20 DIAGNOSIS — D62 Acute posthemorrhagic anemia: Secondary | ICD-10-CM

## 2013-10-20 DIAGNOSIS — K31819 Angiodysplasia of stomach and duodenum without bleeding: Secondary | ICD-10-CM

## 2013-10-20 DIAGNOSIS — R05 Cough: Secondary | ICD-10-CM

## 2013-10-20 DIAGNOSIS — Z860100 Personal history of colon polyps, unspecified: Secondary | ICD-10-CM

## 2013-10-20 DIAGNOSIS — M479 Spondylosis, unspecified: Secondary | ICD-10-CM | POA: Diagnosis present

## 2013-10-20 DIAGNOSIS — R221 Localized swelling, mass and lump, neck: Secondary | ICD-10-CM

## 2013-10-20 DIAGNOSIS — I129 Hypertensive chronic kidney disease with stage 1 through stage 4 chronic kidney disease, or unspecified chronic kidney disease: Secondary | ICD-10-CM | POA: Diagnosis present

## 2013-10-20 DIAGNOSIS — N179 Acute kidney failure, unspecified: Principal | ICD-10-CM | POA: Diagnosis present

## 2013-10-20 DIAGNOSIS — F411 Generalized anxiety disorder: Secondary | ICD-10-CM

## 2013-10-20 DIAGNOSIS — Q393 Congenital stenosis and stricture of esophagus: Secondary | ICD-10-CM | POA: Diagnosis not present

## 2013-10-20 DIAGNOSIS — I214 Non-ST elevation (NSTEMI) myocardial infarction: Secondary | ICD-10-CM

## 2013-10-20 DIAGNOSIS — I059 Rheumatic mitral valve disease, unspecified: Secondary | ICD-10-CM

## 2013-10-20 DIAGNOSIS — R21 Rash and other nonspecific skin eruption: Secondary | ICD-10-CM

## 2013-10-20 DIAGNOSIS — E860 Dehydration: Secondary | ICD-10-CM | POA: Diagnosis present

## 2013-10-20 DIAGNOSIS — N390 Urinary tract infection, site not specified: Secondary | ICD-10-CM | POA: Diagnosis present

## 2013-10-20 DIAGNOSIS — N189 Chronic kidney disease, unspecified: Secondary | ICD-10-CM | POA: Diagnosis not present

## 2013-10-20 DIAGNOSIS — I4891 Unspecified atrial fibrillation: Secondary | ICD-10-CM | POA: Diagnosis not present

## 2013-10-20 DIAGNOSIS — M810 Age-related osteoporosis without current pathological fracture: Secondary | ICD-10-CM | POA: Diagnosis present

## 2013-10-20 DIAGNOSIS — M25552 Pain in left hip: Secondary | ICD-10-CM

## 2013-10-20 DIAGNOSIS — D500 Iron deficiency anemia secondary to blood loss (chronic): Secondary | ICD-10-CM | POA: Diagnosis present

## 2013-10-20 DIAGNOSIS — D72819 Decreased white blood cell count, unspecified: Secondary | ICD-10-CM

## 2013-10-20 DIAGNOSIS — R195 Other fecal abnormalities: Secondary | ICD-10-CM

## 2013-10-20 DIAGNOSIS — B9789 Other viral agents as the cause of diseases classified elsewhere: Secondary | ICD-10-CM | POA: Diagnosis present

## 2013-10-20 DIAGNOSIS — R42 Dizziness and giddiness: Secondary | ICD-10-CM

## 2013-10-20 DIAGNOSIS — Z780 Asymptomatic menopausal state: Secondary | ICD-10-CM

## 2013-10-20 DIAGNOSIS — K222 Esophageal obstruction: Secondary | ICD-10-CM

## 2013-10-20 DIAGNOSIS — R197 Diarrhea, unspecified: Secondary | ICD-10-CM

## 2013-10-20 DIAGNOSIS — N19 Unspecified kidney failure: Secondary | ICD-10-CM

## 2013-10-20 DIAGNOSIS — R7302 Impaired glucose tolerance (oral): Secondary | ICD-10-CM

## 2013-10-20 DIAGNOSIS — K922 Gastrointestinal hemorrhage, unspecified: Secondary | ICD-10-CM | POA: Diagnosis present

## 2013-10-20 DIAGNOSIS — R509 Fever, unspecified: Secondary | ICD-10-CM | POA: Diagnosis present

## 2013-10-20 LAB — URINALYSIS, ROUTINE W REFLEX MICROSCOPIC
BILIRUBIN URINE: NEGATIVE
Glucose, UA: NEGATIVE mg/dL
Ketones, ur: NEGATIVE mg/dL
NITRITE: NEGATIVE
Protein, ur: NEGATIVE mg/dL
SPECIFIC GRAVITY, URINE: 1.008 (ref 1.005–1.030)
Urobilinogen, UA: 0.2 mg/dL (ref 0.0–1.0)
pH: 7.5 (ref 5.0–8.0)

## 2013-10-20 LAB — CBC
HCT: 35.3 % — ABNORMAL LOW (ref 36.0–46.0)
HEMOGLOBIN: 11.6 g/dL — AB (ref 12.0–15.0)
MCH: 28.5 pg (ref 26.0–34.0)
MCHC: 32.9 g/dL (ref 30.0–36.0)
MCV: 86.7 fL (ref 78.0–100.0)
Platelets: 297 10*3/uL (ref 150–400)
RBC: 4.07 MIL/uL (ref 3.87–5.11)
RDW: 20.8 % — ABNORMAL HIGH (ref 11.5–15.5)
WBC: 10.1 10*3/uL (ref 4.0–10.5)

## 2013-10-20 LAB — BASIC METABOLIC PANEL
Anion gap: 17 — ABNORMAL HIGH (ref 5–15)
BUN: 20 mg/dL (ref 6–23)
CO2: 23 meq/L (ref 19–32)
CREATININE: 1.86 mg/dL — AB (ref 0.50–1.10)
Calcium: 9.7 mg/dL (ref 8.4–10.5)
Chloride: 97 mEq/L (ref 96–112)
GFR calc Af Amer: 29 mL/min — ABNORMAL LOW (ref >90)
GFR calc non Af Amer: 25 mL/min — ABNORMAL LOW (ref >90)
GLUCOSE: 103 mg/dL — AB (ref 70–99)
Potassium: 4.1 mEq/L (ref 3.7–5.3)
SODIUM: 137 meq/L (ref 137–147)

## 2013-10-20 LAB — URINE MICROSCOPIC-ADD ON

## 2013-10-20 LAB — I-STAT CG4 LACTIC ACID, ED: LACTIC ACID, VENOUS: 1.29 mmol/L (ref 0.5–2.2)

## 2013-10-20 MED ORDER — B COMPLEX-C PO TABS
1.0000 | ORAL_TABLET | ORAL | Status: DC
  Filled 2013-10-20: qty 1

## 2013-10-20 MED ORDER — ONDANSETRON HCL 4 MG/2ML IJ SOLN: 4.0000 mg | Freq: Four times a day (QID) | INTRAMUSCULAR | Status: DC | PRN

## 2013-10-20 MED ORDER — FERROUS SULFATE 325 (65 FE) MG PO TABS
325.0000 mg | ORAL_TABLET | Freq: Two times a day (BID) | ORAL | Status: DC
  Administered 2013-10-21 – 2013-10-23 (×5): 325 mg via ORAL
  Filled 2013-10-20 (×7): qty 1

## 2013-10-20 MED ORDER — ENOXAPARIN SODIUM 30 MG/0.3ML ~~LOC~~ SOLN
30.0000 mg | SUBCUTANEOUS | Status: DC
  Filled 2013-10-20: qty 0.3

## 2013-10-20 MED ORDER — DEXTROSE 5 % IV SOLN
1.0000 g | INTRAVENOUS | Status: DC
  Administered 2013-10-20: 1 g via INTRAVENOUS
  Filled 2013-10-20 (×2): qty 10

## 2013-10-20 MED ORDER — ADULT MULTIVITAMIN W/MINERALS CH
1.0000 | ORAL_TABLET | Freq: Every day | ORAL | Status: DC
  Administered 2013-10-22 – 2013-10-23 (×2): 1 via ORAL
  Filled 2013-10-20 (×3): qty 1

## 2013-10-20 MED ORDER — ASPIRIN EC 81 MG PO TBEC
81.0000 mg | DELAYED_RELEASE_TABLET | Freq: Every day | ORAL | Status: DC
  Administered 2013-10-23: 81 mg via ORAL
  Filled 2013-10-20 (×3): qty 1

## 2013-10-20 MED ORDER — METOPROLOL SUCCINATE ER 50 MG PO TB24
50.0000 mg | ORAL_TABLET | Freq: Every morning | ORAL | Status: DC
  Administered 2013-10-21 – 2013-10-23 (×3): 50 mg via ORAL
  Filled 2013-10-20 (×3): qty 1

## 2013-10-20 MED ORDER — FLUTICASONE PROPIONATE 0.05 % EX CREA: 1.0000 "application " | TOPICAL_CREAM | Freq: Two times a day (BID) | CUTANEOUS | Status: DC | PRN

## 2013-10-20 MED ORDER — ACETAMINOPHEN 325 MG PO TABS
650.0000 mg | ORAL_TABLET | Freq: Once | ORAL | Status: AC
  Administered 2013-10-20: 650 mg via ORAL
  Filled 2013-10-20: qty 2

## 2013-10-20 MED ORDER — FLUTICASONE PROPIONATE 0.005 % EX OINT
TOPICAL_OINTMENT | Freq: Two times a day (BID) | CUTANEOUS | Status: DC | PRN
  Filled 2013-10-20: qty 30

## 2013-10-20 MED ORDER — SODIUM CHLORIDE 0.9 % IV SOLN
INTRAVENOUS | Status: AC
  Administered 2013-10-20 – 2013-10-21 (×2): via INTRAVENOUS

## 2013-10-20 MED ORDER — NITROGLYCERIN 0.4 MG SL SUBL: 0.4000 mg | SUBLINGUAL_TABLET | SUBLINGUAL | Status: DC | PRN

## 2013-10-20 MED ORDER — SODIUM CHLORIDE 0.9 % IV BOLUS (SEPSIS)
1000.0000 mL | Freq: Once | INTRAVENOUS | Status: AC
  Administered 2013-10-20: 1000 mL via INTRAVENOUS

## 2013-10-20 MED ORDER — ACETAMINOPHEN 325 MG PO TABS: 650.0000 mg | ORAL_TABLET | Freq: Four times a day (QID) | ORAL | Status: DC | PRN

## 2013-10-20 MED ORDER — PANTOPRAZOLE SODIUM 40 MG PO TBEC
40.0000 mg | DELAYED_RELEASE_TABLET | Freq: Every day | ORAL | Status: DC
  Administered 2013-10-21 – 2013-10-23 (×3): 40 mg via ORAL
  Filled 2013-10-20 (×4): qty 1

## 2013-10-20 MED ORDER — ACETAMINOPHEN 650 MG RE SUPP: 650.0000 mg | Freq: Four times a day (QID) | RECTAL | Status: DC | PRN

## 2013-10-20 MED ORDER — ONDANSETRON HCL 4 MG PO TABS: 4.0000 mg | ORAL_TABLET | Freq: Four times a day (QID) | ORAL | Status: DC | PRN

## 2013-10-20 MED ORDER — B COMPLEX PO TABS: 1.0000 | ORAL_TABLET | ORAL | Status: DC

## 2013-10-20 MED ORDER — ATORVASTATIN CALCIUM 20 MG PO TABS
20.0000 mg | ORAL_TABLET | Freq: Every day | ORAL | Status: DC
  Administered 2013-10-21 – 2013-10-22 (×2): 20 mg via ORAL
  Filled 2013-10-20 (×3): qty 1

## 2013-10-20 NOTE — Telephone Encounter (Signed)
Discussed with pt that she should contact her PCP for these symptoms, pt verbalized understanding.

## 2013-10-20 NOTE — Progress Notes (Signed)
Attempted to obtain report.  Unable to reach nurse at this time.

## 2013-10-20 NOTE — ED Provider Notes (Signed)
CSN: [id number]     Arrival date & time 10/20/13  1819 History   First MD Initiated Contact with Patient 10/20/13 2011     Chief Complaint  Patient presents with  . Fever   HPI  Hx provided by the pt. Pt is a 80 yo female with hx of HTN, hyperlipidemia, CAD, a fib, and previous gi bleed who presents with complaints of cough and fever. Cough began 3 days ago with fever yesterday. Cough is non-productive without blood. Symptoms also associated with fatigue and chills. Pt does report a recent blood transfusion at the end of June and also having an upper endoscopy last week prior to symptoms. Pt has otherwise been well. No recent travel. No specific sick contacts. There is no associated chest pain, congestion, sore throat, nausea, vomiting, abdominal pain, or urinary symptoms.    Past Medical History  Diagnosis Date  . Hyperlipidemia   . Mitral valve prolapse     Remotely - last echo 2011 did not show this  . ANEMIA-IRON DEFICIENCY     presumed GI bleed 08/2012- anticoag stopped  . LEUKOPENIA, MILD 05/30/2008  . Hypertension 11/12/2006  . COPD 03/09/2007    ? pt is unsure  . OSTEOARTHRITIS 02/19/2009    Spinal  . OSTEOPOROSIS 11/12/2006  . COLONIC POLYPS, HX OF 03/09/2007  . HYPERGLYCEMIA 07/02/2007  . PSVT     a. Remotely, in setting of anemia.  [name] Positive H. pylori test 10/1996  . Cataract   . Blood transfusion     a. 05/2010: 3 transfusions for anemia/GIB.  [name] GI bleed     a. 05/2010: Hgb 7 at outside hospital s/p 3 u PRBC, EGD 05/2010 mild gastritis, small hiatal hernia, Schatzki ring, negative colonoscopy; capsule endoscopy 06/2010 - possible nonspecific inflammatory changes.;  b.  08/2012: 1/2 guiac +; required transfusion - Apixaban d/c'd  . Hx of echocardiogram     a. echo 2/11:  EF 55-60%, mild AI, mild RAE, mild to mod TR;   b.  a. Echo 2/14:  Mild focal basal and mild LVH, EF 60-65%, mild AI, mild RAE, trivia effusion  . NSTEMI (non-ST elevated myocardial infarction) 03/2012    a. Type  II in setting of AF with RVR 03/2012  . Atrial fibrillation     a. Apixaban started 03/2012 - stopped after admx for GI bleed 6/14  . CAD (coronary artery disease)     a. LHC 1/14:  mLAD serial 30 and 60%, oRCA 30%, EF 60%  . Spinal stenosis of lumbar region 07/16/2012   Past Surgical History  Procedure Laterality Date  . Abdominal hysterectomy    . Tonsillectomy    . Tubal ligation    . Cataract extraction    . Cardiac catheterization  1992    S/P in 1992 at [facility] in [place]. This was negative for any coronary artery disease  . Carotid duplex  08/29/2003  . Ceasarean    . Enteroscopy N/A 10/10/2013    Procedure: ENTEROSCOPY;  Surgeon: Inda Castle, MD;  Location: [facility];  Service: Endoscopy;  Laterality: N/A;   Family History  Problem Relation Age of Onset  . Stomach cancer Mother   . Sarcoidosis Sister   . Colon cancer Brother   . Sarcoidosis Other   . CAD Neg Hx    History  Substance Use Topics  . Smoking status: Never Smoker   . Smokeless tobacco: Never Used  . Alcohol Use: No  OB History   Grav Para Term Preterm Abortions TAB SAB Ect Mult Living                 Review of Systems  Constitutional: Positive for fever and fatigue. Negative for appetite change.  HENT: Negative for congestion and sore throat.   Respiratory: Positive for cough.   Gastrointestinal: Negative for nausea, vomiting, abdominal pain and diarrhea.  Genitourinary: Negative for dysuria, frequency, hematuria and flank pain.  All other systems reviewed and are negative.     Allergies  Azithromycin; Cefuroxime axetil; Clarithromycin; Doxycycline; Hydrocod polst-cpm polst er; Metronidazole; and Rofecoxib  Home Medications   Prior to Admission medications   Medication Sig Start Date End Date Taking? Authorizing Provider  acetaminophen (TYLENOL) 500 MG tablet Take 1,000 mg by mouth daily as needed for mild pain. For pain/fever    Historical Provider, MD   aspirin EC 81 MG tablet Take 1 tablet (81 mg total) by mouth daily. 10/12/12   Liliane Shi, PA-C  atorvastatin (LIPITOR) 20 MG tablet Take 20 mg by mouth daily.    Historical Provider, MD  b complex vitamins tablet Take 1 tablet by mouth 2 (two) times a week.     Historical Provider, MD  Calcium Carbonate-Vitamin D (CALTRATE 600+D) 600-400 MG-UNIT per tablet Take 1 tablet by mouth daily.      Historical Provider, MD  ferrous sulfate 325 (65 FE) MG tablet Take 325 mg by mouth 2 (two) times daily with a meal.    Historical Provider, MD  fluticasone (CUTIVATE) 0.05 % cream Apply 1 application topically 2 (two) times daily as needed (for leg rash).    Historical Provider, MD  metoprolol succinate (TOPROL-XL) 50 MG 24 hr tablet Take 50 mg by mouth every morning. Take with or immediately following a meal.    Historical Provider, MD  Multiple Vitamin (MULTIVITAMIN) tablet Take 1 tablet by mouth daily.      Historical Provider, MD  nitroGLYCERIN (NITROSTAT) 0.4 MG SL tablet Place 0.4 mg under the tongue every 5 (five) minutes as needed for chest pain.    Historical Provider, MD  omeprazole (PRILOSEC) 20 MG capsule Take 1 capsule (20 mg total) by mouth daily. 10/10/13   Inda Castle, MD   BP 164/85  Pulse 102  Temp(Src) 100.6 F (38.1 C) (Rectal)  Resp 17  SpO2 100% Physical Exam  Nursing note and vitals reviewed. Constitutional: She is oriented to person, place, and time. She appears well-developed and well-nourished. No distress.  HENT:  Head: Normocephalic and atraumatic.  Mouth/Throat: Oropharynx is clear and moist.  Neck: Normal range of motion.  No meningeal signs.  Cardiovascular: Regular rhythm.  Tachycardia present.   Pulmonary/Chest: Effort normal and breath sounds normal. No respiratory distress. She has no wheezes. She has no rales.  Abdominal: Soft. There is no tenderness. There is no rebound and no guarding.  Musculoskeletal: Normal range of motion.  Neurological: She is alert  and oriented to person, place, and time.  Skin: Skin is warm and dry. No rash noted.  Psychiatric: She has a normal mood and affect. Her behavior is normal.    ED Course  Procedures   COORDINATION OF CARE:  Nursing notes reviewed. Vital signs reviewed. Initial pt interview and examination performed.   Filed Vitals:   10/20/13 1822 10/20/13 1929 10/20/13 2009  BP: 166/88 164/85   Pulse: 118 102   Temp: 98.6 F (37 C) 99.1 F (37.3 C) 100.6 F (38.1 C)  TempSrc:  Oral Oral Rectal  Resp: 18 17   SpO2: 99% 100%     8:18 PM-Pt seen and evaluated. Pt resting appears comfortable. Normal respirations and O2 sats. No signs of respiratory distress.   The patient continues to be doing well. Heart rate has improved after 2 L of fluid. She does have signs of acute renal injury with elevated creatinine. No signs of pneumonia on chest x-ray. Possible signs of UTI. She has no urinary symptoms. Fever improved after Tylenol.  Patient discussed with attending physician. Given age and acute renal injury as well as fever of unknown source we'll plan to consult internal medicine.  Spoke with [facility] hospitalist. They will admit pt.  Treatment plan initiated: Medications  sodium chloride 0.9 % bolus 1,000 mL (1,000 mLs Intravenous New Bag/Given 10/20/13 2010)    Results for orders placed during the hospital encounter of 10/20/13  CBC      Result Value Ref Range   WBC 10.1  4.0 - 10.5 K/uL   RBC 4.07  3.87 - 5.11 MIL/uL   Hemoglobin 11.6 (*) 12.0 - 15.0 g/dL   HCT 35.3 (*) 36.0 - 46.0 %   MCV 86.7  78.0 - 100.0 fL   MCH 28.5  26.0 - 34.0 pg   MCHC 32.9  30.0 - 36.0 g/dL   RDW 20.8 (*) 11.5 - 15.5 %   Platelets 297  150 - 400 K/uL  BASIC METABOLIC PANEL      Result Value Ref Range   Sodium 137  137 - 147 mEq/L   Potassium 4.1  3.7 - 5.3 mEq/L   Chloride 97  96 - 112 mEq/L   CO2 23  19 - 32 mEq/L   Glucose, Bld 103 (*) 70 - 99 mg/dL   BUN 20  6 - 23 mg/dL   Creatinine, Ser 1.86 (*) 0.50 -  1.10 mg/dL   Calcium 9.7  8.4 - 10.5 mg/dL   GFR calc non Af Amer 25 (*) >90 mL/min   GFR calc Af Amer 29 (*) >90 mL/min   Anion gap 17 (*) 5 - 15  URINALYSIS, ROUTINE W REFLEX MICROSCOPIC      Result Value Ref Range   Color, Urine YELLOW  YELLOW   APPearance CLEAR  CLEAR   Specific Gravity, Urine 1.008  1.005 - 1.030   pH 7.5  5.0 - 8.0   Glucose, UA NEGATIVE  NEGATIVE mg/dL   Hgb urine dipstick MODERATE (*) NEGATIVE   Bilirubin Urine NEGATIVE  NEGATIVE   Ketones, ur NEGATIVE  NEGATIVE mg/dL   Protein, ur NEGATIVE  NEGATIVE mg/dL   Urobilinogen, UA 0.2  0.0 - 1.0 mg/dL   Nitrite NEGATIVE  NEGATIVE   Leukocytes, UA MODERATE (*) NEGATIVE  URINE MICROSCOPIC-ADD ON      Result Value Ref Range   Squamous Epithelial / LPF RARE  RARE   WBC, UA 7-10  <3 WBC/hpf   RBC / HPF 0-2  <3 RBC/hpf   Bacteria, UA RARE  RARE  I-STAT CG4 LACTIC ACID, ED      Result Value Ref Range   Lactic Acid, Venous 1.29  0.5 - 2.2 mmol/L    Imaging Review Dg Abd Acute W/chest  10/20/2013   CLINICAL DATA:  Status post endoscopy 1 week ago. Fever and cough for 3 days.  EXAM: ACUTE ABDOMEN SERIES (ABDOMEN 2 VIEW & CHEST 1 VIEW)  COMPARISON:  Single view of the chest 04/23/2012.  FINDINGS: Single view of the chest demonstrates cardiomegaly without  edema. Lungs are clear. No pneumothorax or pleural fluid.  Two views of the abdomen show no free intraperitoneal air. The bowel gas pattern is nonobstructive. No abnormal abdominal calcification or focal bony abnormality is seen.  IMPRESSION: No acute finding chest or abdomen.  Cardiomegaly.   Electronically Signed   By: Inge Rise M.D.   On: 10/20/2013 20:45     MDM   Final diagnoses:  Fever, unspecified fever cause  Renal failure        Martie Lee, PA-C 10/20/13 2355

## 2013-10-20 NOTE — H&P (Signed)
[facility] History and Physical  Lindsay Doyle [id number] DOB: 1933-05-09 DOA: 10/20/2013  Referring physician: ER physician. PCP: Cathlean Cower, MD   Chief Complaint: Fever.  HPI: Lindsay Doyle is a 80 y.o. female with history of paroxysmal atrial fibrillation not on anticoagulation because of GI bleed, who has received recent blood transfusion for anemia and also had enteroscopy done for chronic GI bleed presents to the ER because of fatigue weakness and fever. Patient states that she has been having fever and chills for last 3-4 days with nonproductive cough. Denies any chest pain shortness of breath nausea vomiting abdominal pain diarrhea or any blood in the stools recently. In the ER patient was mildly febrile with urinalysis showing leukocyte esterase and patient's creatinine increased from baseline. Patient was empirically started on ceftriaxone and was given normal saline bolus. Patient will be admitted for observation.   Review of Systems: As presented in the history of presenting illness, rest negative.  Past Medical History  Diagnosis Date  . Hyperlipidemia   . Mitral valve prolapse     Remotely - last echo 2011 did not show this  . ANEMIA-IRON DEFICIENCY     presumed GI bleed 08/2012- anticoag stopped  . LEUKOPENIA, MILD 05/30/2008  . Hypertension 11/12/2006  . COPD 03/09/2007    ? pt is unsure  . OSTEOARTHRITIS 02/19/2009    Spinal  . OSTEOPOROSIS 11/12/2006  . COLONIC POLYPS, HX OF 03/09/2007  . HYPERGLYCEMIA 07/02/2007  . PSVT     a. Remotely, in setting of anemia.  [name] Positive H. pylori test 10/1996  . Cataract   . Blood transfusion     a. 05/2010: 3 transfusions for anemia/GIB.  [name] GI bleed     a. 05/2010: Hgb 7 at outside hospital s/p 3 u PRBC, EGD 05/2010 mild gastritis, small hiatal hernia, Schatzki ring, negative colonoscopy; capsule endoscopy 06/2010 - possible nonspecific inflammatory changes.;  b.  08/2012: 1/2 guiac +; required transfusion - Apixaban d/c'd   . Hx of echocardiogram     a. echo 2/11:  EF 55-60%, mild AI, mild RAE, mild to mod TR;   b.  a. Echo 2/14:  Mild focal basal and mild LVH, EF 60-65%, mild AI, mild RAE, trivia effusion  . NSTEMI (non-ST elevated myocardial infarction) 03/2012    a. Type II in setting of AF with RVR 03/2012  . Atrial fibrillation     a. Apixaban started 03/2012 - stopped after admx for GI bleed 6/14  . CAD (coronary artery disease)     a. LHC 1/14:  mLAD serial 30 and 60%, oRCA 30%, EF 60%  . Spinal stenosis of lumbar region 07/16/2012   Past Surgical History  Procedure Laterality Date  . Abdominal hysterectomy    . Tonsillectomy    . Tubal ligation    . Cataract extraction    . Cardiac catheterization  1992    S/P in 1992 at [facility] in [place]. This was negative for any coronary artery disease  . Carotid duplex  08/29/2003  . Ceasarean    . Enteroscopy N/A 10/10/2013    Procedure: ENTEROSCOPY;  Surgeon: Inda Castle, MD;  Location: [facility];  Service: Endoscopy;  Laterality: N/A;   Social History:  reports that she has never smoked. She has never used smokeless tobacco. She reports that she does not drink alcohol or use illicit drugs. Where does patient live home. Can patient participate in ADLs? Yes.  Allergies  Allergen Reactions  .  Doxycycline     unknown  . Metronidazole Other (See Comments)    Pt had difficulty swallowing this and says it was "horrible" to take. No allergic reaction.    Family History:  Family History  Problem Relation Age of Onset  . Stomach cancer Mother   . Sarcoidosis Sister   . Colon cancer Brother   . Sarcoidosis Other   . CAD Neg Hx       Prior to Admission medications   Medication Sig Start Date End Date Taking? Authorizing Provider  acetaminophen (TYLENOL) 500 MG tablet Take 1,000 mg by mouth daily as needed for mild pain. For pain/fever   Yes Historical Provider, MD  aspirin EC 81 MG tablet Take 1 tablet (81 mg total) by  mouth daily. 10/12/12  Yes Scott Joylene Draft, PA-C  atorvastatin (LIPITOR) 20 MG tablet Take 20 mg by mouth daily.   Yes Historical Provider, MD  b complex vitamins tablet Take 1 tablet by mouth 2 (two) times a week.    Yes Historical Provider, MD  Calcium Carbonate-Vitamin D (CALTRATE 600+D) 600-400 MG-UNIT per tablet Take 1 tablet by mouth daily.     Yes Historical Provider, MD  ferrous sulfate 325 (65 FE) MG tablet Take 325 mg by mouth 2 (two) times daily with a meal.   Yes Historical Provider, MD  fluticasone (CUTIVATE) 0.05 % cream Apply 1 application topically 2 (two) times daily as needed (for leg rash).   Yes Historical Provider, MD  metoprolol succinate (TOPROL-XL) 50 MG 24 hr tablet Take 50 mg by mouth every morning. Take with or immediately following a meal.   Yes Historical Provider, MD  Multiple Vitamin (MULTIVITAMIN) tablet Take 1 tablet by mouth daily.     Yes Historical Provider, MD  nitroGLYCERIN (NITROSTAT) 0.4 MG SL tablet Place 0.4 mg under the tongue every 5 (five) minutes as needed for chest pain.   Yes Historical Provider, MD  omeprazole (PRILOSEC) 20 MG capsule Take 1 capsule (20 mg total) by mouth daily. 10/10/13  Yes Inda Castle, MD    Physical Exam: Filed Vitals:   10/20/13 2145 10/20/13 2200 10/20/13 2215 10/20/13 2230  BP: 152/74 143/78 142/70 150/82  Pulse: 99 91 89 93  Temp:      TempSrc:      Resp:      SpO2: 100% 100% 100% 100%     General:  Moderately built and nourished.  Eyes: Anicteric no pallor.  ENT: No discharge from ears eyes nose mouth.  Neck: Left jugulodigastric lymph node is enlarged and patient states ENT surgeon is following up.  Cardiovascular: S1-S2 heard.  Respiratory: No rhonchi or crepitations.  Abdomen: Soft nontender bowel sounds present. No guarding rigidity.  Skin: No rash.  Musculoskeletal: No edema.  Psychiatric: Appears normal.  Neurologic: Alert awake oriented to time place and person. Moves all  extremities.  Labs on Admission:  Basic Metabolic Panel:  Recent Labs Lab 10/20/13 1829  NA 137  K 4.1  CL 97  CO2 23  GLUCOSE 103*  BUN 20  CREATININE 1.86*  CALCIUM 9.7   Liver Function Tests: No results found for this basename: AST, ALT, ALKPHOS, BILITOT, PROT, ALBUMIN,  in the last 168 hours No results found for this basename: LIPASE, AMYLASE,  in the last 168 hours No results found for this basename: AMMONIA,  in the last 168 hours CBC:  Recent Labs Lab 10/20/13 1829  WBC 10.1  HGB 11.6*  HCT 35.3*  MCV 86.7  PLT 297   Cardiac Enzymes: No results found for this basename: CKTOTAL, CKMB, CKMBINDEX, TROPONINI,  in the last 168 hours  BNP (last 3 results) No results found for this basename: PROBNP,  in the last 8760 hours CBG: No results found for this basename: GLUCAP,  in the last 168 hours  Radiological Exams on Admission: Dg Abd Acute W/chest  10/20/2013   CLINICAL DATA:  Status post endoscopy 1 week ago. Fever and cough for 3 days.  EXAM: ACUTE ABDOMEN SERIES (ABDOMEN 2 VIEW & CHEST 1 VIEW)  COMPARISON:  Single view of the chest 04/23/2012.  FINDINGS: Single view of the chest demonstrates cardiomegaly without edema. Lungs are clear. No pneumothorax or pleural fluid.  Two views of the abdomen show no free intraperitoneal air. The bowel gas pattern is nonobstructive. No abnormal abdominal calcification or focal bony abnormality is seen.  IMPRESSION: No acute finding chest or abdomen.  Cardiomegaly.   Electronically Signed   By: Inge Rise M.D.   On: 10/20/2013 20:45     Assessment/Plan Principal Problem:   Fever Active Problems:   Atrial fibrillation    UTI (lower urinary tract infection)   ARF (acute renal failure)   1. Fever - source not clear but at this time empirically treating for possible UTI. Follow urine cultures and fever pattern. Patient has chronic enlarged left jugulodigastric lymph node which has been followed by ENT surgeon. 2. Acute  renal failure - probably from dehydration. For now patient has been gently hydrated. Closely follow intake output and metabolic panel. 3. Paroxysmal atrial fibrillation - presently rate controlled. Continue metoprolol but not on Coumadin secondary to GI bleed. 4. Chronic GI bleed has had recent enteroscopy - follow CBC. 5. History of nonobstructive CAD.  6. Anemia probably from #4 - closely follow CBC.    Code Status: Full code.  Family Communication: None.  Disposition Plan: Admit for observation.    Nitish Roes N. [facility] Pager [phone].  If 7PM-7AM, please contact night-coverage [email or web address] Password [facility] [place] 10/20/2013, 11:07 PM

## 2013-10-20 NOTE — ED Notes (Addendum)
Pt reports she had endoscopy done a week ago and prior to that had to have blood transfusions. Pt reports fever and dry cough for 3 days. Pt reports being cold and fatigue. Denies pain sob or blood in stool.

## 2013-10-21 ENCOUNTER — Other Ambulatory Visit: Payer: Self-pay

## 2013-10-21 ENCOUNTER — Inpatient Hospital Stay (HOSPITAL_COMMUNITY): Payer: Medicare Other

## 2013-10-21 ENCOUNTER — Telehealth: Payer: Self-pay | Admitting: *Deleted

## 2013-10-21 DIAGNOSIS — D509 Iron deficiency anemia, unspecified: Secondary | ICD-10-CM

## 2013-10-21 DIAGNOSIS — N189 Chronic kidney disease, unspecified: Secondary | ICD-10-CM

## 2013-10-21 DIAGNOSIS — N179 Acute kidney failure, unspecified: Secondary | ICD-10-CM | POA: Diagnosis present

## 2013-10-21 LAB — COMPREHENSIVE METABOLIC PANEL
ALBUMIN: 3.2 g/dL — AB (ref 3.5–5.2)
ALT: 16 U/L (ref 0–35)
AST: 17 U/L (ref 0–37)
Alkaline Phosphatase: 56 U/L (ref 39–117)
Anion gap: 17 — ABNORMAL HIGH (ref 5–15)
BUN: 18 mg/dL (ref 6–23)
CO2: 22 mEq/L (ref 19–32)
Calcium: 9 mg/dL (ref 8.4–10.5)
Chloride: 104 mEq/L (ref 96–112)
Creatinine, Ser: 1.86 mg/dL — ABNORMAL HIGH (ref 0.50–1.10)
GFR calc non Af Amer: 25 mL/min — ABNORMAL LOW (ref >90)
GFR, EST AFRICAN AMERICAN: 29 mL/min — AB (ref >90)
GLUCOSE: 81 mg/dL (ref 70–99)
Potassium: 3.2 mEq/L — ABNORMAL LOW (ref 3.7–5.3)
SODIUM: 143 meq/L (ref 137–147)
TOTAL PROTEIN: 7 g/dL (ref 6.0–8.3)
Total Bilirubin: 0.2 mg/dL — ABNORMAL LOW (ref 0.3–1.2)

## 2013-10-21 LAB — CBC WITH DIFFERENTIAL/PLATELET
Basophils Absolute: 0 10*3/uL (ref 0.0–0.1)
Basophils Relative: 0 % (ref 0–1)
Eosinophils Absolute: 0.2 10*3/uL (ref 0.0–0.7)
Eosinophils Relative: 2 % (ref 0–5)
HCT: 31.1 % — ABNORMAL LOW (ref 36.0–46.0)
Hemoglobin: 10.1 g/dL — ABNORMAL LOW (ref 12.0–15.0)
LYMPHS PCT: 20 % (ref 12–46)
Lymphs Abs: 1.7 10*3/uL (ref 0.7–4.0)
MCH: 27.7 pg (ref 26.0–34.0)
MCHC: 32.5 g/dL (ref 30.0–36.0)
MCV: 85.2 fL (ref 78.0–100.0)
MONOS PCT: 17 % — AB (ref 3–12)
Monocytes Absolute: 1.4 10*3/uL — ABNORMAL HIGH (ref 0.1–1.0)
NEUTROS ABS: 5 10*3/uL (ref 1.7–7.7)
NEUTROS PCT: 61 % (ref 43–77)
Platelets: 280 10*3/uL (ref 150–400)
RBC: 3.65 MIL/uL — ABNORMAL LOW (ref 3.87–5.11)
RDW: 21 % — ABNORMAL HIGH (ref 11.5–15.5)
WBC: 8.3 10*3/uL (ref 4.0–10.5)

## 2013-10-21 LAB — SODIUM, URINE, RANDOM: SODIUM UR: 53 meq/L

## 2013-10-21 LAB — TROPONIN I: Troponin I: <0.3 ng/mL (ref <0.30)

## 2013-10-21 LAB — CREATININE, URINE, RANDOM: Creatinine, Urine: 19.46 mg/dL

## 2013-10-21 LAB — CK: Total CK: 138 U/L (ref 7–177)

## 2013-10-21 MED ORDER — POTASSIUM CHLORIDE CRYS ER 20 MEQ PO TBCR
40.0000 meq | EXTENDED_RELEASE_TABLET | Freq: Once | ORAL | Status: AC
  Administered 2013-10-21: 40 meq via ORAL
  Filled 2013-10-21: qty 2

## 2013-10-21 NOTE — ED Provider Notes (Signed)
Medical screening examination/treatment/procedure(s) were conducted as a shared visit with non-physician practitioner(s) and myself.  I personally evaluated the patient during the encounter.   EKG Interpretation None      Patient with fever, likely respiratory related, no PNA. Has ARF as well, will admit to hospitalist.  Ephraim Hamburger, MD 10/21/13 2209

## 2013-10-21 NOTE — Progress Notes (Signed)
PROGRESS NOTE  Lindsay Doyle [id number] DOB: 02-23-1934 DOA: 10/20/2013 PCP: Cathlean Cower, MD  Interim history 80 year old female with a history of hypertension, paroxysmal atrial fibrillation, hyperlipidemia, GI bleed, iron deficiency anemia, and coronary artery disease with hx of NSTEMI presents with three-day history of increasing nonproductive cough, cold intolerance, and worsening fatigue. The patient states that she had a low-grade temperature at home up to 100.19F. In emergency department, the patient had temperature 100.40F. Workup in the emergency department revealed worsening of present creatinine, 1.86. chest x-ray abdominal x-ray were unremarkable. Urinalysis did not show any pyuria. The patient has remained hemodynamically stable. She denies any chest discomfort, shortness of breath vomiting, diarrhea, abdominal pain, dysuria, hematuria, hematochezia.   Assessment/Plan: Acute on chronic renal failure  -Baseline creatinine 0.8-0.9 -Patient denies any NSAID use  -Suspect volume depletion due to insensible water loss from the patient's fever -Renal ultrasound -IV fluids -CK Fever  -Suspect underlying nonspecific viral infection  -Urinalysis did not show significant pyuria  -Discontinue antibiotics and observe  -10/20/13 CXR and AXR negative Fatigue -Multifactorial including deconditioning, acute on chronic renal failure, and anemia -Cycle troponins - 05/22/2012 echocardiogram showed EF 60-65%, no WMA -EKG -TSH 2/18 on 09/22/13 Paroxysmal atrial fibrillation  -Presently in sinus rhythm  -Continue metoprolol succinate  -Not on anticoagulation secondary to GI bleed  -Patient was taken off of apixiban June 2014  -The patient also stopped taking aspirin approximately 2 weeks ago secondary to GI bleed  History of GI bleed with chronic blood loss anemia  -Patient received 2 units PRBCs 09/27/2013  -Hemoglobin 10.1 on the day of admission  -Continue iron  supplementation  -06/09/12--EGD with mild gastritis, colonoscopy negative -June 2014 capsule endoscopy capsule endoscopy with nonspecific inflammatory changes -Patient had an enteroscopy 10/10/2013--per patient, Dr. Deatra Ina had to coagulate some "capillaries" CAD with hx NSTEMI -LHC Jan 2014--nonobstructive CAD -continue metoprolol succinate  Hyperlipidemia   -Continue statin  Hypokalemia -Replete -Check magnesium  Family Communication:   Pt at beside Disposition Plan:   Home when medically stable       Procedures/Studies: Dg Abd Acute W/chest  10/20/2013   CLINICAL DATA:  Status post endoscopy 1 week ago. Fever and cough for 3 days.  EXAM: ACUTE ABDOMEN SERIES (ABDOMEN 2 VIEW & CHEST 1 VIEW)  COMPARISON:  Single view of the chest 04/23/2012.  FINDINGS: Single view of the chest demonstrates cardiomegaly without edema. Lungs are clear. No pneumothorax or pleural fluid.  Two views of the abdomen show no free intraperitoneal air. The bowel gas pattern is nonobstructive. No abnormal abdominal calcification or focal bony abnormality is seen.  IMPRESSION: No acute finding chest or abdomen.  Cardiomegaly.   Electronically Signed   By: Inge Rise M.D.   On: 10/20/2013 20:45         Subjective:  patient feels fatigued but denies any fevers, chills, chest pain, shortness breath, nausea, vomiting, diarrhea, vomiting, dysuria, hematuria. No rashes. Denies any headaches or visual disturbance   Objective: Filed Vitals:   10/20/13 2230 10/20/13 2317 10/21/13 0514 10/21/13 1000  BP: 150/82 160/91 155/83 148/78  Pulse: 93 94 89 90  Temp:  98.2 F (36.8 C) 98.2 F (36.8 C) 98 F (36.7 C)  TempSrc:  Oral Oral Oral  Resp:  18 18 18   Height:  5\' 1"  (1.549 m)    Weight:  50.984 kg (112 lb 6.4 oz)    SpO2: 100% 98% 97% 98%  Intake/Output Summary (Last 24 hours) at 10/21/13 1148 Last data filed at 10/21/13 1100  Gross per 24 hour  Intake    420 ml  Output   1750 ml  Net  -1330 ml    Weight change:  Exam:   General:  Pt is alert, follows commands appropriately, not in acute distress  HEENT: No icterus, No thrush, [place]/AT  Cardiovascular: RRR, S1/S2, no rubs, no gallops  Respiratory: CTA bilaterally, no wheezing, no crackles, no rhonchi  Abdomen: Soft/+BS, non tender, non distended, no guarding  Extremities: No edema, No lymphangitis, No petechiae, No rashes, no synovitis  Data Reviewed: Basic Metabolic Panel:  Recent Labs Lab 10/20/13 1829 10/21/13 0500  NA 137 143  K 4.1 3.2*  CL 97 104  CO2 23 22  GLUCOSE 103* 81  BUN 20 18  CREATININE 1.86* 1.86*  CALCIUM 9.7 9.0   Liver Function Tests:  Recent Labs Lab 10/21/13 0500  AST 17  ALT 16  ALKPHOS 56  BILITOT 0.2*  PROT 7.0  ALBUMIN 3.2*   No results found for this basename: LIPASE, AMYLASE,  in the last 168 hours No results found for this basename: AMMONIA,  in the last 168 hours CBC:  Recent Labs Lab 10/20/13 1829 10/21/13 0500  WBC 10.1 8.3  NEUTROABS  --  5.0  HGB 11.6* 10.1*  HCT 35.3* 31.1*  MCV 86.7 85.2  PLT 297 280   Cardiac Enzymes: No results found for this basename: CKTOTAL, CKMB, CKMBINDEX, TROPONINI,  in the last 168 hours BNP: No components found with this basename: POCBNP,  CBG: No results found for this basename: GLUCAP,  in the last 168 hours  No results found for this or any previous visit (from the past 240 hour(s)).   Scheduled Meds: . aspirin EC  81 mg Oral Daily  . atorvastatin  20 mg Oral Daily  . [START ON 10/24/2013] B-complex with vitamin C  1 tablet Oral Once per day on Mon Thu  . cefTRIAXone (ROCEPHIN)  IV  1 g Intravenous Q24H  . ferrous sulfate  325 mg Oral BID WC  . metoprolol succinate  50 mg Oral q morning - 10a  . multivitamin with minerals  1 tablet Oral Daily  . pantoprazole  40 mg Oral Daily   Continuous Infusions: . sodium chloride 75 mL/hr at 10/20/13 2330     Anthoney Sheppard, DO  [facility] Pager [phone]  If 7PM-7AM,  please contact night-coverage [email or web address] Password [email or web address] 10/21/2013, 11:48 AM   LOS: 1 day

## 2013-10-21 NOTE — Progress Notes (Signed)
PT Cancellation Note  Patient Details Name: Lindsay Doyle MRN: [id number] DOB: March 02, 1934   Cancelled Treatment:    Reason Eval/Treat Not Completed: Patient declined, no reason specified .  Pt. With difficulty understanding why she needs PT evaluation.  I explained reasons for PT evaluation as requested by MD.  Pt. stating "I don't think now is the time until they find out what's wrong with me".  Pt. Agreeable to have PT return on Monday to assess her.     Ladona Ridgel 10/21/2013, 12:03 PM Gerlean Ren PT [facility] [place] [phone]

## 2013-10-21 NOTE — Telephone Encounter (Signed)
[facility] Triage Call Report Triage Record Num: [id number] Operator: Vaughan Sine Patient Name: Lindsay Doyle Call Date & Time: 10/20/2013 5:14:01PM Patient Phone: [phone] PCP: Cathlean Cower Patient Gender: Female PCP Fax : [phone] Patient DOB: [birth year] Practice Name: [facility] Reason for Call: Caller: Unk Lightning; PCP: Cathlean Cower (Adults only); CB#: [phone]; Call regarding dry Cough, low-grade Fever, onset 7-23. Temp 100.2 Oral at 1716 on 7-23. Pt had Endoscopy on 7-13. Received 2 units of blood on 6-30. Cough protocol used, ED dispo d/t new onset odx of COPD and Endoscopy. Pt will go to [facility] ED. Protocol(s) Used: Cough - Adult Recommended Outcome per Protocol: See ED Immediately Reason for Outcome: New onset or worsening cough AND recent (within 4 wks.) surgery or trauma, or prolonged immobilization (bedrest, long travel), or smoker taking medication with estrogen Care Advice: ~ 10/20/2013 5:23:31PM Page 1 of 1 [id number]

## 2013-10-21 NOTE — Progress Notes (Signed)
Brief Nutrition Note: Consult received for dietary concerns. Pt prefers no pork, nutritional services ambassador notified.  [place], [place], [place] Pager [phone] After Hours Pager

## 2013-10-22 DIAGNOSIS — Q393 Congenital stenosis and stricture of esophagus: Secondary | ICD-10-CM

## 2013-10-22 DIAGNOSIS — Q391 Atresia of esophagus with tracheo-esophageal fistula: Secondary | ICD-10-CM

## 2013-10-22 DIAGNOSIS — R5383 Other fatigue: Secondary | ICD-10-CM

## 2013-10-22 DIAGNOSIS — R5381 Other malaise: Secondary | ICD-10-CM

## 2013-10-22 LAB — URINE CULTURE
COLONY COUNT: NO GROWTH
Culture: NO GROWTH

## 2013-10-22 LAB — CBC
HCT: 30.9 % — ABNORMAL LOW (ref 36.0–46.0)
Hemoglobin: 9.9 g/dL — ABNORMAL LOW (ref 12.0–15.0)
MCH: 27.4 pg (ref 26.0–34.0)
MCHC: 32 g/dL (ref 30.0–36.0)
MCV: 85.6 fL (ref 78.0–100.0)
Platelets: 282 10*3/uL (ref 150–400)
RBC: 3.61 MIL/uL — ABNORMAL LOW (ref 3.87–5.11)
RDW: 20.9 % — AB (ref 11.5–15.5)
WBC: 7.4 10*3/uL (ref 4.0–10.5)

## 2013-10-22 LAB — TROPONIN I: Troponin I: <0.3 ng/mL (ref <0.30)

## 2013-10-22 LAB — BASIC METABOLIC PANEL
ANION GAP: 14 (ref 5–15)
BUN: 23 mg/dL (ref 6–23)
CO2: 19 mEq/L (ref 19–32)
Calcium: 8.8 mg/dL (ref 8.4–10.5)
Chloride: 107 mEq/L (ref 96–112)
Creatinine, Ser: 1.83 mg/dL — ABNORMAL HIGH (ref 0.50–1.10)
GFR, EST AFRICAN AMERICAN: 29 mL/min — AB (ref >90)
GFR, EST NON AFRICAN AMERICAN: 25 mL/min — AB (ref >90)
Glucose, Bld: 97 mg/dL (ref 70–99)
Potassium: 4 mEq/L (ref 3.7–5.3)
SODIUM: 140 meq/L (ref 137–147)

## 2013-10-22 LAB — MAGNESIUM: MAGNESIUM: 2 mg/dL (ref 1.5–2.5)

## 2013-10-22 MED ORDER — SODIUM CHLORIDE 0.9 % IV SOLN
INTRAVENOUS | Status: DC
  Administered 2013-10-22 – 2013-10-23 (×2): via INTRAVENOUS

## 2013-10-22 NOTE — Progress Notes (Signed)
PROGRESS NOTE  Lindsay Doyle [id number] DOB: [birth year] DOA: 10/20/2013 PCP: Cathlean Cower, MD  Assessment/Plan: Acute on chronic renal failure  -Baseline creatinine 0.8-0.9  -Patient denies any NSAID use  -Suspect volume depletion due to insensible water loss from the patient's fever  -Renal ultrasound--negative for hydronephrosis  -IV fluids--increase IVF to 100cc/hr  -CK--138 Fever  -Suspect underlying nonspecific viral infection  -Urinalysis did not show significant pyuria  -Discontinue antibiotics and observe  -10/20/13 CXR and AXR negative  Fatigue  -Multifactorial including deconditioning, acute on chronic renal failure, and anemia  -Cycle troponins--neg - 05/22/2012 echocardiogram showed EF 60-65%, no WMA  -EKG--NSR -TSH 2/18 on 09/22/13  Paroxysmal atrial fibrillation  -Presently in sinus rhythm  -Continue metoprolol succinate  -Not on anticoagulation secondary to GI bleed  -Patient was taken off of apixiban June 2014  -The patient also stopped taking aspirin approximately 2 weeks ago secondary to GI bleed  History of GI bleed with chronic blood loss anemia  -Patient received 2 units PRBCs 09/27/2013  -Hemoglobin 10.1 on the day of admission  -Continue iron supplementation  -06/09/12--EGD with mild gastritis, colonoscopy negative  -June 2014 capsule endoscopy capsule endoscopy with nonspecific inflammatory changes  -Patient had an enteroscopy 10/10/2013--per patient, Dr. Deatra Ina had to coagulate some "capillaries"  CAD with hx NSTEMI  -LHC Jan 2014--nonobstructive CAD  -continue metoprolol succinate  Hyperlipidemia  -Continue statin  Hypokalemia  -Repleted  -Check magnesium--2.0  Family Communication: Husband updated at beside  Disposition Plan: Home when medically stable          Procedures/Studies: US Renal  10/21/2013   CLINICAL DATA:  Acute kidney injury.  EXAM: RENAL/URINARY TRACT ULTRASOUND COMPLETE  COMPARISON:  Ultrasound of January 09, 2012.  FINDINGS: Right Kidney:  Length: 11.5 cm. Increased echogenicity of renal parenchyma is noted consistent with medical renal disease. No mass or hydronephrosis visualized.  Left Kidney:  Length: 11.7 cm. Increased echogenicity of renal parenchyma is noted consistent with medical renal disease. No mass or hydronephrosis visualized.  Bladder:  Appears normal for degree of bladder distention.  IMPRESSION: Increased echogenicity of renal parenchyma is noted bilaterally consistent with medical renal disease. No hydronephrosis or renal obstruction is noted.   Electronically Signed   By: Sabino Dick M.D.   On: 10/21/2013 16:11   Dg Abd Acute W/chest  10/20/2013   CLINICAL DATA:  Status post endoscopy 1 week ago. Fever and cough for 3 days.  EXAM: ACUTE ABDOMEN SERIES (ABDOMEN 2 VIEW & CHEST 1 VIEW)  COMPARISON:  Single view of the chest 04/23/2012.  FINDINGS: Single view of the chest demonstrates cardiomegaly without edema. Lungs are clear. No pneumothorax or pleural fluid.  Two views of the abdomen show no free intraperitoneal air. The bowel gas pattern is nonobstructive. No abnormal abdominal calcification or focal bony abnormality is seen.  IMPRESSION: No acute finding chest or abdomen.  Cardiomegaly.   Electronically Signed   By: Inge Rise M.D.   On: 10/20/2013 20:45         Subjective: Patient denies fevers, chills, headache, chest pain, dyspnea, nausea, vomiting, diarrhea, abdominal pain, dysuria, hematuria   Objective: Filed Vitals:   10/21/13 1720 10/21/13 2100 10/22/13 0500 10/22/13 0913  BP: 145/80 148/84 146/89 129/75  Pulse: 89 86 73 89  Temp: 98.4 F (36.9 C) 98.9 F (37.2 C) 99 F (37.2 C) 97.9 F (36.6 C)  TempSrc: Oral Oral Oral Oral  Resp: 18 18 16  18  Height:      Weight:  50.7 kg (111 lb 12.4 oz)    SpO2: 97% 98% 96% 98%    Intake/Output Summary (Last 24 hours) at 10/22/13 1241 Last data filed at 10/22/13 0300  Gross per 24 hour  Intake   1320 ml    Output   1600 ml  Net   -280 ml   Weight change: -0.284 kg (-10 oz) Exam:   General:  Pt is alert, follows commands appropriately, not in acute distress  HEENT: No icterus, No thrush,  [place]/AT  Cardiovascular: RRR, S1/S2, no rubs, no gallops  Respiratory: CTA bilaterally, no wheezing, no crackles, no rhonchi  Abdomen: Soft/+BS, non tender, non distended, no guarding  Extremities: No edema, No lymphangitis, No petechiae, No rashes, no synovitis  Data Reviewed: Basic Metabolic Panel:  Recent Labs Lab 10/20/13 1829 10/21/13 0500 10/22/13 0110  NA 137 143 140  K 4.1 3.2* 4.0  CL 97 104 107  CO2 23 22 19   GLUCOSE 103* 81 97  BUN 20 18 23   CREATININE 1.86* 1.86* 1.83*  CALCIUM 9.7 9.0 8.8  MG  --   --  2.0   Liver Function Tests:  Recent Labs Lab 10/21/13 0500  AST 17  ALT 16  ALKPHOS 56  BILITOT 0.2*  PROT 7.0  ALBUMIN 3.2*   No results found for this basename: LIPASE, AMYLASE,  in the last 168 hours No results found for this basename: AMMONIA,  in the last 168 hours CBC:  Recent Labs Lab 10/20/13 1829 10/21/13 0500 10/22/13 0110  WBC 10.1 8.3 7.4  NEUTROABS  --  5.0  --   HGB 11.6* 10.1* 9.9*  HCT 35.3* 31.1* 30.9*  MCV 86.7 85.2 85.6  PLT 297 280 282   Cardiac Enzymes:  Recent Labs Lab 10/21/13 1255 10/21/13 1258 10/21/13 1900 10/22/13 0110  CKTOTAL 138  --   --   --   TROPONINI  --  <0.30 <0.30 <0.30   BNP: No components found with this basename: POCBNP,  CBG: No results found for this basename: GLUCAP,  in the last 168 hours  Recent Results (from the past 240 hour(s))  URINE CULTURE     Status: None   Collection Time    10/20/13 10:29 PM      Result Value Ref Range Status   Specimen Description URINE, RANDOM   Final   Special Requests ADDED [id number]   Final   Culture  Setup Time     Final   Value: 10/20/2013 00:09     Performed at [facility]     Final   Value: NO GROWTH     Performed at [facility]   Culture     Final   Value: NO GROWTH     Performed at [facility]   Report Status 10/22/2013 FINAL   Final     Scheduled Meds: . aspirin EC  81 mg Oral Daily  . atorvastatin  20 mg Oral Daily  . [START ON 10/24/2013] B-complex with vitamin C  1 tablet Oral Once per day on Mon Thu  . ferrous sulfate  325 mg Oral BID WC  . metoprolol succinate  50 mg Oral q morning - 10a  . multivitamin with minerals  1 tablet Oral Daily  . pantoprazole  40 mg Oral Daily   Continuous Infusions: . sodium chloride       Shatana Saxton, DO  [facility] Pager [phone]  If 7PM-7AM, please contact night-coverage [email or web address] Password [email or web address] 10/22/2013, 12:41 PM   LOS: 2 days

## 2013-10-23 DIAGNOSIS — I10 Essential (primary) hypertension: Secondary | ICD-10-CM

## 2013-10-23 LAB — BASIC METABOLIC PANEL
ANION GAP: 14 (ref 5–15)
BUN: 20 mg/dL (ref 6–23)
CHLORIDE: 107 meq/L (ref 96–112)
CO2: 21 mEq/L (ref 19–32)
Calcium: 9 mg/dL (ref 8.4–10.5)
Creatinine, Ser: 1.72 mg/dL — ABNORMAL HIGH (ref 0.50–1.10)
GFR calc Af Amer: 32 mL/min — ABNORMAL LOW (ref >90)
GFR calc non Af Amer: 27 mL/min — ABNORMAL LOW (ref >90)
Glucose, Bld: 91 mg/dL (ref 70–99)
POTASSIUM: 3.9 meq/L (ref 3.7–5.3)
Sodium: 142 mEq/L (ref 137–147)

## 2013-10-23 NOTE — Discharge Summary (Signed)
Physician Discharge Summary  Lindsay Doyle [id number] DOB: [birth year] DOA: 10/20/2013  PCP: Cathlean Cower, MD  Admit date: 10/20/2013 Discharge date: 10/23/2013  Recommendations for Outpatient Follow-up:  1. Pt will need to follow up with PCP in 2 weeks post discharge 2. Please obtain BMP one week    Discharge Diagnoses:  Acute on chronic renal failure  -Baseline creatinine 0.8-0.9  -serum creatinine peaked at 1.86 during admission -Patient denies any NSAID use  -Suspect volume depletion due to insensible water loss from the patient's fever  -Renal ultrasound--negative for hydronephrosis  -IV fluids--increase IVF to 100cc/hr  -Serum creatinine improved to 1.72 on the day of discharge -Patient wanted to go home because of an upcoming trip--I instructed pt to drink plenty of water and avoid any NSAIDS -CK--138   Fever  -Suspect underlying nonspecific viral infection  -Urinalysis did not show significant pyuria  -Discontinue antibiotics and observe  -10/20/13 CXR and AXR negative  -no fevers after initial temperature of 100.25F in the emergency department Fatigue  -Multifactorial including deconditioning, acute on chronic renal failure, and anemia  -Cycle troponins--neg  - 05/22/2012 echocardiogram showed EF 60-65%, no WMA  -EKG--NSR  -TSH 2/18 on 09/22/13  Paroxysmal atrial fibrillation  -Presently in sinus rhythm  -Continue metoprolol succinate  -Not on anticoagulation secondary to GI bleed  -Patient was taken off of apixiban June 2014  -The patient also stopped taking aspirin approximately 2 weeks ago secondary to GI bleed  History of GI bleed with chronic blood loss anemia  -Patient received 2 units PRBCs 09/27/2013  -Hemoglobin 10.1 on the day of admission  -Continue iron supplementation  -06/09/12--EGD with mild gastritis, colonoscopy negative  -June 2014 capsule endoscopy capsule endoscopy with nonspecific inflammatory changes  -Patient had an enteroscopy  10/10/2013--per patient, Dr. Deatra Ina had to coagulate some "capillaries"  CAD with hx NSTEMI  -LHC Jan 2014--nonobstructive CAD  -continue metoprolol succinate  Hyperlipidemia  -Continue statin  Hypokalemia  -Repleted  -Check magnesium--2.0    Discharge Condition: stable  Disposition: home  Diet:cardiac Wt Readings from Last 3 Encounters:  10/22/13 50.984 kg (112 lb 6.4 oz)  09/27/13 51.71 kg (114 lb)  09/23/13 51.823 kg (114 lb 4 oz)    History of present illness:  80 year old female with a history of hypertension, paroxysmal atrial fibrillation, hyperlipidemia, GI bleed, iron deficiency anemia, and coronary artery disease with hx of NSTEMI presents with three-day history of increasing nonproductive cough, cold intolerance, and worsening fatigue. The patient states that she had a low-grade temperature at home up to 100.61F. In emergency department, the patient had temperature 100.25F. Workup in the emergency department revealed worsening of present creatinine, 1.86. chest x-ray abdominal x-ray were unremarkable. Urinalysis did not show any pyuria. The patient has remained hemodynamically stable. She denies any chest discomfort, shortness of breath vomiting, diarrhea, abdominal pain, dysuria, hematuria, hematochezia.   The patient was started on intravenous fluids. Intravenous fluids were increased with slow improvement of her serum creatinine. The patient stated that she had to leave for an important trip she has planned. Because the patient's serum creatinine was improving, and the patient was clinically stable the patient was discharged in stable condition with instructions to drink plenty of water and to avoid any NSAIDs.     Discharge Exam: Filed Vitals:   10/23/13 0418  BP: 155/85  Pulse: 88  Temp: 98 F (36.7 C)  Resp: 17   Filed Vitals:   10/22/13 0913 10/22/13 1700 10/22/13 2028 10/23/13 0418  BP: 129/75 154/90  157/81 155/85  Pulse: 89 90 86 88  Temp: 97.9 F (36.6  C) 98.6 F (37 C) 98.1 F (36.7 C) 98 F (36.7 C)  TempSrc: Oral Oral Oral Oral  Resp: 18 18 17 17   Height:      Weight:   50.984 kg (112 lb 6.4 oz)   SpO2: 98% 96% 96% 97%   General: A&O x 3, NAD, pleasant, cooperative Cardiovascular: RRR, no rub, no gallop, no S3 Respiratory: CTAB, no wheeze, no rhonchi Abdomen:soft, nontender, nondistended, positive bowel sounds Extremities: No edema, No lymphangitis, no petechiae  Discharge Instructions      Discharge Instructions   Diet - low sodium heart healthy    Complete by:  As directed      Discharge instructions    Complete by:  As directed   Avoid taking ibuprofen, Advil, Motrin, Aleve, naproxen, BC powders, excedrin Drink plenty of water     Increase activity slowly    Complete by:  As directed             Medication List         acetaminophen 500 MG tablet  Commonly known as:  TYLENOL  Take 1,000 mg by mouth daily as needed for mild pain. For pain/fever     aspirin EC 81 MG tablet  Take 1 tablet (81 mg total) by mouth daily.     atorvastatin 20 MG tablet  Commonly known as:  LIPITOR  Take 20 mg by mouth daily.     b complex vitamins tablet  Take 1 tablet by mouth 2 (two) times a week.     CALTRATE 600+D 600-400 MG-UNIT per tablet  Generic drug:  Calcium Carbonate-Vitamin D  Take 1 tablet by mouth daily.     ferrous sulfate 325 (65 FE) MG tablet  Take 325 mg by mouth 2 (two) times daily with a meal.     fluticasone 0.05 % cream  Commonly known as:  CUTIVATE  Apply 1 application topically 2 (two) times daily as needed (for leg rash).     metoprolol succinate 50 MG 24 hr tablet  Commonly known as:  TOPROL-XL  Take 50 mg by mouth every morning. Take with or immediately following a meal.     multivitamin tablet  Take 1 tablet by mouth daily.     nitroGLYCERIN 0.4 MG SL tablet  Commonly known as:  NITROSTAT  Place 0.4 mg under the tongue every 5 (five) minutes as needed for chest pain.     omeprazole  20 MG capsule  Commonly known as:  PRILOSEC  Take 1 capsule (20 mg total) by mouth daily.         The results of significant diagnostics from this hospitalization (including imaging, microbiology, ancillary and laboratory) are listed below for reference.    Significant Diagnostic Studies: US Renal  10/21/2013   CLINICAL DATA:  Acute kidney injury.  EXAM: RENAL/URINARY TRACT ULTRASOUND COMPLETE  COMPARISON:  Ultrasound of January 09, 2012.  FINDINGS: Right Kidney:  Length: 11.5 cm. Increased echogenicity of renal parenchyma is noted consistent with medical renal disease. No mass or hydronephrosis visualized.  Left Kidney:  Length: 11.7 cm. Increased echogenicity of renal parenchyma is noted consistent with medical renal disease. No mass or hydronephrosis visualized.  Bladder:  Appears normal for degree of bladder distention.  IMPRESSION: Increased echogenicity of renal parenchyma is noted bilaterally consistent with medical renal disease. No hydronephrosis or renal obstruction is noted.   Electronically Signed   By: Jeneen Rinks  Green M.D.   On: 10/21/2013 16:11   Dg Abd Acute W/chest  10/20/2013   CLINICAL DATA:  Status post endoscopy 1 week ago. Fever and cough for 3 days.  EXAM: ACUTE ABDOMEN SERIES (ABDOMEN 2 VIEW & CHEST 1 VIEW)  COMPARISON:  Single view of the chest 04/23/2012.  FINDINGS: Single view of the chest demonstrates cardiomegaly without edema. Lungs are clear. No pneumothorax or pleural fluid.  Two views of the abdomen show no free intraperitoneal air. The bowel gas pattern is nonobstructive. No abnormal abdominal calcification or focal bony abnormality is seen.  IMPRESSION: No acute finding chest or abdomen.  Cardiomegaly.   Electronically Signed   By: Inge Rise M.D.   On: 10/20/2013 20:45     Microbiology: Recent Results (from the past 240 hour(s))  URINE CULTURE     Status: None   Collection Time    10/20/13 10:29 PM      Result Value Ref Range Status   Specimen  Description URINE, RANDOM   Final   Special Requests ADDED [id number]   Final   Culture  Setup Time     Final   Value: 10/20/2013 00:09     Performed at [place]     Final   Value: NO GROWTH     Performed at [facility]   Culture     Final   Value: NO GROWTH     Performed at [facility]   Report Status 10/22/2013 FINAL   Final     Labs: Basic Metabolic Panel:  Recent Labs Lab 10/20/13 1829 10/21/13 0500 10/22/13 0110 10/23/13 0508  NA 137 143 140 142  K 4.1 3.2* 4.0 3.9  CL 97 104 107 107  CO2 23 22 19 21   GLUCOSE 103* 81 97 91  BUN 20 18 23 20   CREATININE 1.86* 1.86* 1.83* 1.72*  CALCIUM 9.7 9.0 8.8 9.0  MG  --   --  2.0  --    Liver Function Tests:  Recent Labs Lab 10/21/13 0500  AST 17  ALT 16  ALKPHOS 56  BILITOT 0.2*  PROT 7.0  ALBUMIN 3.2*   No results found for this basename: LIPASE, AMYLASE,  in the last 168 hours No results found for this basename: AMMONIA,  in the last 168 hours CBC:  Recent Labs Lab 10/20/13 1829 10/21/13 0500 10/22/13 0110  WBC 10.1 8.3 7.4  NEUTROABS  --  5.0  --   HGB 11.6* 10.1* 9.9*  HCT 35.3* 31.1* 30.9*  MCV 86.7 85.2 85.6  PLT 297 280 282   Cardiac Enzymes:  Recent Labs Lab 10/21/13 1255 10/21/13 1258 10/21/13 1900 10/22/13 0110  CKTOTAL 138  --   --   --   TROPONINI  --  <0.30 <0.30 <0.30   BNP: No components found with this basename: POCBNP,  CBG: No results found for this basename: GLUCAP,  in the last 168 hours  Time coordinating discharge:  Greater than 30 minutes  Signed:  Kebin Maye, DO [facility] Pager: [phone] 10/23/2013, 11:23 AM

## 2013-10-26 ENCOUNTER — Encounter: Payer: Self-pay | Admitting: Internal Medicine

## 2013-10-26 ENCOUNTER — Other Ambulatory Visit (INDEPENDENT_AMBULATORY_CARE_PROVIDER_SITE_OTHER): Payer: Medicare Other

## 2013-10-26 ENCOUNTER — Ambulatory Visit (INDEPENDENT_AMBULATORY_CARE_PROVIDER_SITE_OTHER)
Admission: RE | Admit: 2013-10-26 | Discharge: 2013-10-26 | Disposition: A | Discharge status: Home / Self Care | Payer: Medicare Other | Source: Ambulatory Visit | Attending: Internal Medicine | Admitting: Internal Medicine

## 2013-10-26 ENCOUNTER — Other Ambulatory Visit: Payer: Self-pay | Admitting: Internal Medicine

## 2013-10-26 ENCOUNTER — Ambulatory Visit (INDEPENDENT_AMBULATORY_CARE_PROVIDER_SITE_OTHER): Payer: Medicare Other | Admitting: Internal Medicine

## 2013-10-26 VITALS — BP 130/80 | HR 95 | Temp 98.4°F | Wt 112.2 lb

## 2013-10-26 DIAGNOSIS — D62 Acute posthemorrhagic anemia: Secondary | ICD-10-CM | POA: Diagnosis not present

## 2013-10-26 DIAGNOSIS — N183 Chronic kidney disease, stage 3 unspecified: Secondary | ICD-10-CM

## 2013-10-26 DIAGNOSIS — N184 Chronic kidney disease, stage 4 (severe): Secondary | ICD-10-CM

## 2013-10-26 DIAGNOSIS — N179 Acute kidney failure, unspecified: Secondary | ICD-10-CM

## 2013-10-26 DIAGNOSIS — R05 Cough: Secondary | ICD-10-CM | POA: Diagnosis not present

## 2013-10-26 DIAGNOSIS — I2510 Atherosclerotic heart disease of native coronary artery without angina pectoris: Secondary | ICD-10-CM

## 2013-10-26 DIAGNOSIS — N189 Chronic kidney disease, unspecified: Secondary | ICD-10-CM

## 2013-10-26 DIAGNOSIS — R059 Cough, unspecified: Secondary | ICD-10-CM

## 2013-10-26 LAB — BASIC METABOLIC PANEL
BUN: 36 mg/dL — ABNORMAL HIGH (ref 6–23)
CHLORIDE: 95 meq/L — AB (ref 96–112)
CO2: 23 mEq/L (ref 19–32)
CREATININE: 3.3 mg/dL — AB (ref 0.4–1.2)
Calcium: 9.4 mg/dL (ref 8.4–10.5)
GFR: 17.37 mL/min — ABNORMAL LOW (ref >60.00)
Glucose, Bld: 96 mg/dL (ref 70–99)
POTASSIUM: 3.8 meq/L (ref 3.5–5.1)
Sodium: 128 mEq/L — ABNORMAL LOW (ref 135–145)

## 2013-10-26 LAB — CBC WITH DIFFERENTIAL/PLATELET
Basophils Absolute: 0 10*3/uL (ref 0.0–0.1)
Basophils Relative: 0.4 % (ref 0.0–3.0)
EOS PCT: 1.1 % (ref 0.0–5.0)
Eosinophils Absolute: 0.1 10*3/uL (ref 0.0–0.7)
HEMATOCRIT: 31.9 % — AB (ref 36.0–46.0)
Hemoglobin: 10.6 g/dL — ABNORMAL LOW (ref 12.0–15.0)
Lymphocytes Relative: 14.9 % (ref 12.0–46.0)
Lymphs Abs: 1.6 10*3/uL (ref 0.7–4.0)
MCHC: 33.2 g/dL (ref 30.0–36.0)
MCV: 85 fl (ref 78.0–100.0)
Monocytes Absolute: 1 10*3/uL (ref 0.1–1.0)
Monocytes Relative: 9 % (ref 3.0–12.0)
NEUTROS PCT: 74.6 % (ref 43.0–77.0)
Neutro Abs: 8 10*3/uL — ABNORMAL HIGH (ref 1.4–7.7)
Platelets: 281 10*3/uL (ref 150.0–400.0)
RBC: 3.75 Mil/uL — AB (ref 3.87–5.11)
RDW: 22.9 % — ABNORMAL HIGH (ref 11.5–15.5)
WBC: 10.7 10*3/uL — ABNORMAL HIGH (ref 4.0–10.5)

## 2013-10-26 NOTE — Progress Notes (Signed)
   Subjective:    Patient ID: Lindsay Doyle, female    DOB: 10-02-1933, 80 y.o.   MRN: [id number]  HPI  Her hospital record 7/23-7/26/15 was reviewed. She was hospitalized with acute on chronic renal failure attributed to viral infection associated with fever causing insensible volume loss.  Baseline creatinine is listed as 0.8-1.2. At time of admission that this was 1.86. With fluid  replacement creatinine was 1.72. She was discharged as she was anxious to leave as she plans to leave on a trip to [place].  The time of admission hemoglobin was 10.1. She received 2 units of blood 09/27/13 for GI bleed in the context of aspirin and oral anticoagulant. Endoscopy supposedly included lasering some vascular bleeding. A copy of that is not documented in the EMR. She continues on iron supplement.    Review of Systems    She continues to have a nonproductive cough and some chills. She's had no fevers since she left the hospital.  She attributes the cough to the endoscopic procedure she had done 7/13. She is not on ACE inhibitor and is a nonsmoker. She continues the PPI therapy.   Epistaxis, hemoptysis, hematuria, melena, or rectal bleeding denied. No unexplained weight loss, significant dyspepsia,dysphagia, or abdominal pain.  There is no abnormal bruising , bleeding, or difficulty stopping bleeding with injury.      Objective:   Physical Exam   Positive or significant findings include: She is thin; she appears much younger than stated age. She is obviously intelligent and articulate. She appears fatigued She does have some edema of the eyelids. There is no associated lip or tongue angioedema. Chest clear but she does have a dry cough intermittently She has a slight tachycardia with gallop type cadence with accentuation of S1 and S2. Pedal pulses are equal; but dorsalis pedis pulses are decreased   General appearance :adequately nourished; in no distress. Eyes: No conjunctival  inflammation or scleral icterus is present. Oral exam: Dental hygiene is good. Lips and gums are healthy appearing.There is no oropharyngeal erythema or exudate noted.  Heart:  No murmur, click, rub or other extra sounds   Lungs:Chest clear to auscultation; no wheezes, rhonchi,rales ,or rubs present.No increased work of breathing.  Abdomen: bowel sounds normal, soft and non-tender without masses, organomegaly or hernias noted.  No guarding or rebound.  Skin:Warm & dry.  Intact without suspicious lesions or rashes ; no jaundice or tenting Lymphatic: No lymphadenopathy is noted about the head, neck, axilla            Assessment & Plan:  #1 acute on chronic renal failure; chemistries will be checked  #2 anemia; CBC and differential to assess the severity and any progression of anemia  #3 cough; most likely etiology is reflux. The PPI will be continued twice a day until cough is gone then she'll revert to once daily.CXR

## 2013-10-26 NOTE — Patient Instructions (Addendum)
Your next office appointment will be determined based upon review of your pending labs & x-rays. Those instructions will be transmitted to you through My Chart   Followup as needed for your acute issue. Please report any significant change in your symptoms.  Reflux of gastric acid may be asymptomatic as this may occur mainly during sleep.The triggers for reflux  include stress; the "aspirin family" ; alcohol; peppermint; and caffeine (coffee, tea, cola, and chocolate). The aspirin family would include aspirin and the nonsteroidal agents such as ibuprofen &  Naproxen. Tylenol would not cause reflux. If having symptoms ; food & drink should be avoided for @ least 2 hours before going to bed.   Until the cough resolves; take the protein pump inhibitor 30 minutes before breakfast and 30 minutes before the evening meal. Once the cough has been resolved for at least 72 hours; go back to once a day  30 minutes before breakfast.Carry room temperature water and sip liberally after coughing.

## 2013-10-26 NOTE — Progress Notes (Signed)
   Subjective:    Patient ID: Lindsay Doyle, female    DOB: 1933-07-27, 80 y.o.   MRN: [id number]  HPI    Review of Systems     Objective:   Physical Exam        Assessment & Plan:

## 2013-10-26 NOTE — Progress Notes (Signed)
Pre visit review using our clinic review tool, if applicable. No additional management support is needed unless otherwise documented below in the visit note. 

## 2013-11-03 ENCOUNTER — Telehealth: Payer: Self-pay | Admitting: Internal Medicine

## 2013-11-03 NOTE — Telephone Encounter (Signed)
Dr Linna Darner received a call from [facility] that they had called pt to get her in next week. Pt told them she did not know anything about being referred ans was not going to schedule until she find out why she's being referred. Please advise.  Thanks

## 2013-11-04 NOTE — Telephone Encounter (Signed)
See my comments attached to BMET results 7/29 which she READ @ 7:49 that day Nephrology referral critical with rising creatinine

## 2013-11-04 NOTE — Telephone Encounter (Signed)
Called the patient did inform her of results of BMET on 10/26/13.  The patient is still not convinced of instructions from BMET results.  She would like PCP to review 10/26/13 results to confirm Dr. Clayborn Heron instructions before she agrees to seeing a Kidney MD.

## 2013-11-04 NOTE — Telephone Encounter (Signed)
Chart reviewed;  Recent events and post hemorrhagic anemia noted improved compared to hgb late June 2015;  Cr has taken a severe increase since last checked, and pt has hx of abd u/s in oct 2013 which showed the beginnings of what looked like significant deterioration of the kidneys, though her function at the time was minimally impaired.    I agree with Dr Linna Darner that this is most likely acute on chronic renal insufficiency, the etiology of which is not clear.  It is not medicaitons which were reviewed.  It would not be anemia since this is improved. Some dehydration (or low volume) may be an element for the acute worsening, or even low grade infection not detected?  Her kidney function now is so severe low, that if this were permanent, she would need to at least consider the possibility of dialysis relatively soon.    I agree with urgent referral to kidney specialist, as the reason for the acute worsening so suddenly is not clear

## 2013-11-04 NOTE — Telephone Encounter (Signed)
Called the patient and did inform of PCP's response to her recent results.  The patient does agree to the referral to [facility] at this time as will accept their appointment.

## 2013-11-07 ENCOUNTER — Telehealth: Payer: Self-pay

## 2013-11-07 NOTE — Telephone Encounter (Signed)
Pt was supposed to have a CBC in 1 month. Pt just had one done the end of July. Do you want pt to have another CBC? Please advise.

## 2013-11-07 NOTE — Telephone Encounter (Signed)
Message copied by Algernon Huxley on Mon Nov 07, 2013  8:44 AM ------      Message from: Anjalina Bergevin, Virginia R      Created: Wed Oct 12, 2013 10:31 AM      Regarding: CBC       Pt needs cbc in 1 mth, order in epic. ------

## 2013-11-09 NOTE — Telephone Encounter (Signed)
no

## 2013-11-10 DIAGNOSIS — N179 Acute kidney failure, unspecified: Secondary | ICD-10-CM | POA: Diagnosis not present

## 2013-11-18 DIAGNOSIS — N179 Acute kidney failure, unspecified: Secondary | ICD-10-CM | POA: Diagnosis not present

## 2013-11-21 ENCOUNTER — Telehealth: Payer: Self-pay

## 2013-11-21 NOTE — Telephone Encounter (Signed)
Message copied by Algernon Huxley on Mon Nov 21, 2013  2:55 PM ------      Message from: Rosanne Sack R      Created: Tue Oct 18, 2013  1:46 PM      Regarding: CBC       Needs CBC in August, order in ------

## 2013-11-21 NOTE — Telephone Encounter (Signed)
Pt aware and will come for labs. 

## 2013-11-22 ENCOUNTER — Other Ambulatory Visit: Payer: Self-pay

## 2013-11-22 ENCOUNTER — Other Ambulatory Visit (INDEPENDENT_AMBULATORY_CARE_PROVIDER_SITE_OTHER): Payer: Medicare Other

## 2013-11-22 DIAGNOSIS — D509 Iron deficiency anemia, unspecified: Secondary | ICD-10-CM

## 2013-11-22 DIAGNOSIS — D508 Other iron deficiency anemias: Secondary | ICD-10-CM

## 2013-11-22 DIAGNOSIS — D649 Anemia, unspecified: Secondary | ICD-10-CM | POA: Diagnosis not present

## 2013-11-22 DIAGNOSIS — I10 Essential (primary) hypertension: Secondary | ICD-10-CM | POA: Diagnosis not present

## 2013-11-22 DIAGNOSIS — E871 Hypo-osmolality and hyponatremia: Secondary | ICD-10-CM | POA: Diagnosis not present

## 2013-11-22 DIAGNOSIS — N179 Acute kidney failure, unspecified: Secondary | ICD-10-CM | POA: Diagnosis not present

## 2013-11-22 LAB — CBC WITH DIFFERENTIAL/PLATELET
BASOS PCT: 0.7 % (ref 0.0–3.0)
Basophils Absolute: 0 10*3/uL (ref 0.0–0.1)
EOS ABS: 0.2 10*3/uL (ref 0.0–0.7)
EOS PCT: 2.8 % (ref 0.0–5.0)
HEMATOCRIT: 33.2 % — AB (ref 36.0–46.0)
Hemoglobin: 10.9 g/dL — ABNORMAL LOW (ref 12.0–15.0)
LYMPHS ABS: 1.6 10*3/uL (ref 0.7–4.0)
Lymphocytes Relative: 26.8 % (ref 12.0–46.0)
MCHC: 32.7 g/dL (ref 30.0–36.0)
MCV: 88.1 fl (ref 78.0–100.0)
MONO ABS: 1 10*3/uL (ref 0.1–1.0)
Monocytes Relative: 16.9 % — ABNORMAL HIGH (ref 3.0–12.0)
Neutro Abs: 3.2 10*3/uL (ref 1.4–7.7)
Neutrophils Relative %: 52.8 % (ref 43.0–77.0)
Platelets: 238 10*3/uL (ref 150.0–400.0)
RBC: 3.76 Mil/uL — AB (ref 3.87–5.11)
RDW: 24 % — ABNORMAL HIGH (ref 11.5–15.5)
WBC: 6.1 10*3/uL (ref 4.0–10.5)

## 2013-12-19 ENCOUNTER — Other Ambulatory Visit (INDEPENDENT_AMBULATORY_CARE_PROVIDER_SITE_OTHER): Payer: Medicare Other

## 2013-12-19 ENCOUNTER — Telehealth: Payer: Self-pay

## 2013-12-19 DIAGNOSIS — D509 Iron deficiency anemia, unspecified: Secondary | ICD-10-CM

## 2013-12-19 LAB — CBC WITH DIFFERENTIAL/PLATELET
BASOS ABS: 0 10*3/uL (ref 0.0–0.1)
Basophils Relative: 0.7 % (ref 0.0–3.0)
EOS ABS: 0.2 10*3/uL (ref 0.0–0.7)
Eosinophils Relative: 4 % (ref 0.0–5.0)
HCT: 32.7 % — ABNORMAL LOW (ref 36.0–46.0)
Hemoglobin: 10.9 g/dL — ABNORMAL LOW (ref 12.0–15.0)
LYMPHS PCT: 44.1 % (ref 12.0–46.0)
Lymphs Abs: 2.7 10*3/uL (ref 0.7–4.0)
MCHC: 33.2 g/dL (ref 30.0–36.0)
MCV: 90.1 fl (ref 78.0–100.0)
MONO ABS: 0.8 10*3/uL (ref 0.1–1.0)
Monocytes Relative: 13.2 % — ABNORMAL HIGH (ref 3.0–12.0)
NEUTROS PCT: 38 % — AB (ref 43.0–77.0)
Neutro Abs: 2.3 10*3/uL (ref 1.4–7.7)
PLATELETS: 270 10*3/uL (ref 150.0–400.0)
RBC: 3.63 Mil/uL — ABNORMAL LOW (ref 3.87–5.11)
RDW: 23.1 % — AB (ref 11.5–15.5)
WBC: 6.1 10*3/uL (ref 4.0–10.5)

## 2013-12-19 NOTE — Telephone Encounter (Signed)
Patient contacted. Agrees to come in for labs.

## 2013-12-19 NOTE — Telephone Encounter (Signed)
Message copied by Greggory Keen on Mon Dec 19, 2013  9:06 AM ------      Message from: Kane, Virginia R      Created: Tue Nov 22, 2013  3:47 PM      Regarding: CBC       Pt needs CBC in Sept, order in epic. ------

## 2013-12-26 ENCOUNTER — Telehealth: Payer: Self-pay | Admitting: Gastroenterology

## 2013-12-26 NOTE — Telephone Encounter (Signed)
Advised her the CBC is similar to her CBC 1 month ago. She states she is planning on an iron rich diet.

## 2013-12-27 ENCOUNTER — Telehealth: Payer: Self-pay

## 2013-12-27 NOTE — Telephone Encounter (Signed)
The patient request a letter (needs 3) stating she is ok to participate in [employer].  She needs by Thursday call back number for this patient when ready is [phone].

## 2013-12-27 NOTE — Telephone Encounter (Signed)
[place] for letter, done hardcopy to robin - 3 copies

## 2013-12-28 NOTE — Telephone Encounter (Signed)
Called the patient at the number in phone note left a detailed message letters are complete and ready for pickup at the front desk.

## 2014-01-16 ENCOUNTER — Other Ambulatory Visit: Payer: Self-pay

## 2014-01-16 MED ORDER — ATORVASTATIN CALCIUM 20 MG PO TABS: 20.0000 mg | ORAL_TABLET | Freq: Every day | ORAL | Status: DC

## 2014-01-25 ENCOUNTER — Encounter: Payer: Self-pay | Admitting: Internal Medicine

## 2014-01-25 ENCOUNTER — Ambulatory Visit (INDEPENDENT_AMBULATORY_CARE_PROVIDER_SITE_OTHER): Payer: Medicare Other | Admitting: Internal Medicine

## 2014-01-25 VITALS — BP 120/78 | HR 84 | Temp 97.5°F | Wt 110.5 lb

## 2014-01-25 DIAGNOSIS — N184 Chronic kidney disease, stage 4 (severe): Secondary | ICD-10-CM | POA: Diagnosis not present

## 2014-01-25 DIAGNOSIS — I10 Essential (primary) hypertension: Secondary | ICD-10-CM | POA: Diagnosis not present

## 2014-01-25 DIAGNOSIS — I2510 Atherosclerotic heart disease of native coronary artery without angina pectoris: Secondary | ICD-10-CM

## 2014-01-25 DIAGNOSIS — Z23 Encounter for immunization: Secondary | ICD-10-CM

## 2014-01-25 DIAGNOSIS — R7302 Impaired glucose tolerance (oral): Secondary | ICD-10-CM

## 2014-01-25 NOTE — Assessment & Plan Note (Signed)
stable overall by history and exam, recent data reviewed with pt, and pt to continue medical treatment as before,  to f/u any worsening symptoms or concerns Lab Results  Component Value Date   HGBA1C 5.7 07/26/2013

## 2014-01-25 NOTE — Progress Notes (Signed)
Pre visit review using our clinic review tool, if applicable. No additional management support is needed unless otherwise documented below in the visit note. 

## 2014-01-25 NOTE — Assessment & Plan Note (Signed)
stable overall by history and exam, recent data reviewed with pt, and pt to continue medical treatment as before,  to f/u any worsening symptoms or concerns BP Readings from Last 3 Encounters:  01/25/14 120/78  10/26/13 130/80  10/23/13 155/85

## 2014-01-25 NOTE — Patient Instructions (Addendum)
You had the flu shot today  Please continue all other medications as before, and refills have been done if requested.  Please have the pharmacy call with any other refills you may need.  Please continue your efforts at being more active, low cholesterol diet, and weight control.  Please keep your appointments with your specialists as you may have planned  Please return in 6 months, or sooner if needed 

## 2014-01-25 NOTE — Progress Notes (Signed)
Subjective:    Patient ID: Lindsay Doyle, female    DOB: [birth year], 80 y.o.   MRN: [id number]  HPI  Here to f/u, after complicated hx since last seen, had GI bleeding followed for several months per Dr Deatra Ina, with last intervention including enteroscopy July 13, stable Hgb since then, no planned f/u for now after last lab checked per pt.  Also noted transient fever post enteroscopy without clear source, tx empiric antibx.  Later with AKI with cr 3.3, hosp'd jly 26-29 with volume depletion?.  Has had since then close f/u with renal with labs twice, next labs planned early next month.  Also followed per Dr McLean/card with appt dec 2015 per pt, currently off apixiban, pt again declines re-start today. Pt denies chest pain, increased sob or doe, wheezing, orthopnea, PND, increased LE swelling, palpitations, dizziness or syncope - no sense to pt of recurrent PAF.   Wants to know if she should stop otc supplements since there has been recalls per [facility] regarding unknown substances.  No other complaints. No dark stools or BRBPR or dizziness.  Pt denies polydipsia, polyuria,   Pt states overall good compliance with meds, trying to follow lower cholesterol diet, wt overall stable. Recent myeloma testing per renal neg per pt  Past Medical History  Diagnosis Date  . Hyperlipidemia   . Mitral valve prolapse     Remotely - last echo 2011 did not show this  . ANEMIA-IRON DEFICIENCY     presumed GI bleed 08/2012- anticoag stopped  . LEUKOPENIA, MILD 05/30/2008  . Hypertension 11/12/2006  . COPD 03/09/2007    ? pt is unsure  . OSTEOARTHRITIS 02/19/2009    Spinal  . OSTEOPOROSIS 11/12/2006  . COLONIC POLYPS, HX OF 03/09/2007  . HYPERGLYCEMIA 07/02/2007  . PSVT     a. Remotely, in setting of anemia.  [name] Positive H. pylori test 10/1996  . Cataract   . Blood transfusion     a. 05/2010: 3 transfusions for anemia/GIB.  [name] GI bleed     a. 05/2010: Hgb 7 at outside hospital s/p 3 u PRBC, EGD 05/2010 mild gastritis,  small hiatal hernia, Schatzki ring, negative colonoscopy; capsule endoscopy 06/2010 - possible nonspecific inflammatory changes.;  b.  08/2012: 1/2 guiac +; required transfusion - Apixaban d/c'd  . Hx of echocardiogram     a. echo 2/11:  EF 55-60%, mild AI, mild RAE, mild to mod TR;   b.  a. Echo 2/14:  Mild focal basal and mild LVH, EF 60-65%, mild AI, mild RAE, trivia effusion  . NSTEMI (non-ST elevated myocardial infarction) 03/2012    a. Type II in setting of AF with RVR 03/2012  . Atrial fibrillation     a. Apixaban started 03/2012 - stopped after admx for GI bleed 6/14  . CAD (coronary artery disease)     a. LHC 1/14:  mLAD serial 30 and 60%, oRCA 30%, EF 60%  . Spinal stenosis of lumbar region 07/16/2012   Past Surgical History  Procedure Laterality Date  . Abdominal hysterectomy    . Tonsillectomy    . Tubal ligation    . Cataract extraction    . Cardiac catheterization  1992    S/P in 1992 at [facility] in [place]. This was negative for any coronary artery disease  . Carotid duplex  08/29/2003  . Ceasarean    . Enteroscopy N/A 10/10/2013    Procedure: ENTEROSCOPY;  Surgeon: Inda Castle, MD;  Location: [facility] END[facility];  Service: Endoscopy;  Laterality: N/A;    reports that she has never smoked. She has never used smokeless tobacco. She reports that she does not drink alcohol or use illicit drugs. family history includes Colon cancer in her brother; Sarcoidosis in her other and sister; Stomach cancer in her mother. There is no history of CAD. Allergies  Allergen Reactions  . Doxycycline     unknown  . Metronidazole Other (See Comments)    Pt had difficulty swallowing this and says it was "horrible" to take. No allergic reaction.   Current Outpatient Prescriptions on File Prior to Visit  Medication Sig Dispense Refill  . acetaminophen (TYLENOL) 500 MG tablet Take 1,000 mg by mouth daily as needed for mild pain. For pain/fever      . aspirin EC 81 MG tablet  Take 1 tablet (81 mg total) by mouth daily.      [name] atorvastatin (LIPITOR) 20 MG tablet Take 1 tablet (20 mg total) by mouth daily.  90 tablet  1  . b complex vitamins tablet Take 1 tablet by mouth 2 (two) times a week.       . Calcium Carbonate-Vitamin D (CALTRATE 600+D) 600-400 MG-UNIT per tablet Take 1 tablet by mouth daily.        . ferrous sulfate 325 (65 FE) MG tablet Take 325 mg by mouth 2 (two) times daily with a meal.      . fluticasone (CUTIVATE) 0.05 % cream Apply 1 application topically 2 (two) times daily as needed (for leg rash).      . metoprolol succinate (TOPROL-XL) 50 MG 24 hr tablet Take 50 mg by mouth every morning. Take with or immediately following a meal.      . Multiple Vitamin (MULTIVITAMIN) tablet Take 1 tablet by mouth daily.        . nitroGLYCERIN (NITROSTAT) 0.4 MG SL tablet Place 0.4 mg under the tongue every 5 (five) minutes as needed for chest pain.       No current facility-administered medications on file prior to visit.     Review of Systems  Constitutional: Negative for unusual diaphoresis or other sweats  HENT: Negative for ringing in ear Eyes: Negative for double vision or worsening visual disturbance.  Respiratory: Negative for choking and stridor.   Gastrointestinal: Negative for vomiting or other signifcant bowel change Genitourinary: Negative for hematuria or decreased urine volume.  Musculoskeletal: Negative for other MSK pain or swelling Skin: Negative for color change and worsening wound.  Neurological: Negative for tremors and numbness other than noted  Psychiatric/Behavioral: Negative for decreased concentration or agitation other than above       Objective:   Physical Exam BP 120/78  Pulse 84  Temp(Src) 97.5 F (36.4 C) (Oral)  Wt 110 lb 8 oz (50.122 kg)  SpO2 92% VS noted,  Constitutional: Pt appears well-developed, well-nourished.  HENT: Head: NCAT.  Right Ear: External ear normal.  Left Ear: External ear normal.  Eyes: . Pupils  are equal, round, and reactive to light. Conjunctivae and EOM are normal Neck: Normal range of motion. Neck supple.  Cardiovascular: Normal rate and regular rhythm.   Pulmonary/Chest: Effort normal and breath sounds normal.  Abd:  Soft, NT, ND, + BS Neurological: Pt is alert. Not confused , motor grossly intact Skin: Skin is warm. No rash Psychiatric: Pt behavior is normal. No agitation. Mild nervous    Assessment & Plan:

## 2014-01-25 NOTE — Assessment & Plan Note (Signed)
overal stable to improved by report, pt delcines further labs today

## 2014-02-14 DIAGNOSIS — N179 Acute kidney failure, unspecified: Secondary | ICD-10-CM | POA: Diagnosis not present

## 2014-02-16 ENCOUNTER — Telehealth: Payer: Self-pay | Admitting: Internal Medicine

## 2014-02-16 DIAGNOSIS — I10 Essential (primary) hypertension: Secondary | ICD-10-CM | POA: Diagnosis not present

## 2014-02-16 DIAGNOSIS — N179 Acute kidney failure, unspecified: Secondary | ICD-10-CM | POA: Diagnosis not present

## 2014-02-16 DIAGNOSIS — D649 Anemia, unspecified: Secondary | ICD-10-CM | POA: Diagnosis not present

## 2014-02-16 NOTE — Telephone Encounter (Signed)
Patient informed. 

## 2014-02-16 NOTE — Telephone Encounter (Signed)
Pt wants to know how often she should have bone density tests? Pt wants to know if she should have regular mammo or 3D mammo?

## 2014-02-16 NOTE — Telephone Encounter (Signed)
OK for DXA every 2-3 yrs  OK for 3d as this can be better

## 2014-03-03 ENCOUNTER — Encounter: Payer: Self-pay | Admitting: Cardiology

## 2014-03-03 ENCOUNTER — Ambulatory Visit (INDEPENDENT_AMBULATORY_CARE_PROVIDER_SITE_OTHER): Payer: Medicare Other | Admitting: Cardiology

## 2014-03-03 VITALS — BP 160/90 | HR 80 | Ht 61.0 in | Wt 110.8 lb

## 2014-03-03 DIAGNOSIS — I10 Essential (primary) hypertension: Secondary | ICD-10-CM

## 2014-03-03 DIAGNOSIS — I2510 Atherosclerotic heart disease of native coronary artery without angina pectoris: Secondary | ICD-10-CM | POA: Diagnosis not present

## 2014-03-03 DIAGNOSIS — I4891 Unspecified atrial fibrillation: Secondary | ICD-10-CM

## 2014-03-03 DIAGNOSIS — I480 Paroxysmal atrial fibrillation: Secondary | ICD-10-CM

## 2014-03-03 NOTE — Patient Instructions (Signed)
Your physician recommends that you have  lab work today--BMET/CBCd  Your physician wants you to follow-up in: 6 months with Dr McLean. (June 2016). You will receive a reminder letter in the mail two months in advance. If you don't receive a letter, please call our office to schedule the follow-up appointment.    

## 2014-03-05 LAB — CBC WITH DIFFERENTIAL/PLATELET
BASOS ABS: 0 10*3/uL (ref 0.0–0.1)
BASOS PCT: 0.5 % (ref 0.0–3.0)
EOS ABS: 0.1 10*3/uL (ref 0.0–0.7)
Eosinophils Relative: 1.9 % (ref 0.0–5.0)
HCT: 36.2 % (ref 36.0–46.0)
HEMOGLOBIN: 11.8 g/dL — AB (ref 12.0–15.0)
Lymphocytes Relative: 29.3 % (ref 12.0–46.0)
Lymphs Abs: 1.9 10*3/uL (ref 0.7–4.0)
MCHC: 32.7 g/dL (ref 30.0–36.0)
MCV: 97.4 fl (ref 78.0–100.0)
MONOS PCT: 12.3 % — AB (ref 3.0–12.0)
Monocytes Absolute: 0.8 10*3/uL (ref 0.1–1.0)
NEUTROS ABS: 3.7 10*3/uL (ref 1.4–7.7)
NEUTROS PCT: 56 % (ref 43.0–77.0)
Platelets: 249 10*3/uL (ref 150.0–400.0)
RBC: 3.71 Mil/uL — AB (ref 3.87–5.11)
RDW: 14.2 % (ref 11.5–15.5)
WBC: 6.6 10*3/uL (ref 4.0–10.5)

## 2014-03-05 LAB — BASIC METABOLIC PANEL
BUN: 20 mg/dL (ref 6–23)
CO2: 28 meq/L (ref 19–32)
CREATININE: 1.1 mg/dL (ref 0.4–1.2)
Calcium: 9.9 mg/dL (ref 8.4–10.5)
Chloride: 103 mEq/L (ref 96–112)
GFR: 62.33 mL/min (ref >60.00)
Glucose, Bld: 82 mg/dL (ref 70–99)
POTASSIUM: 5.2 meq/L — AB (ref 3.5–5.1)
Sodium: 140 mEq/L (ref 135–145)

## 2014-03-05 NOTE — Progress Notes (Signed)
Patient ID: Lindsay Doyle, female   DOB: April 22, 1933, 80 y.o.   MRN: [id number] PCP: Dr. Jenny Reichmann  80 yo with history of paroxysmal atrial fibrillation presents for cardiology followup.  She was admitted in 1/14 with NSTEMI in the setting of AFib with RVR. She spontaneously converted to NSR. LHC demonstrated non-obstructive disease and she was felt to have had demand ischemia. Eliquis 2.5 mg bid was added due to stroke risk. Echo 05/20/12: EF 60-65%.  Mrs Wiggers was admitted in 6/14 to [facility] with anemia.  1/2 stool guaiacs positive.  She required transfusion. Eliquis was stopped due to concern that it triggered GI bleeding.  No scopes were done. She saw [name] back in followup and had a long discussion about anticoagulation.  Ultimately, she decided not to restart anticoagulation.    Since last appointment, she had enteroscopy with cauterization of small bowel AVMs in 7/15.  After that, she was admitted with AKI thought possibly to be due to volume depletion.  No further BRBPR or melena.  BP is high today but has been in the 130s/80s when she checks at home. She takes her BP twice a day.  Recently, she has been stable.  She is in NSR today with no tachypalpitations.  No chest pain or exertional dyspnea.  Main problem has been arthritis pain.    Labs (3/14): K 4.2, creatinine 1.0, LDL 84, HDL 82 Labs (10/14): K 3.9, creatinine 0.9, HCT 39.6, TSH normal, LDL 95, HDL 88 Labs (4/15): K 5.1, creatinine 0.9, LDL 95, HDL 71 Labs (9/15): K 3.7, creatinine 1.84 Labs (11/15): creatinine 1.06  ECG: NSR, anterior TWIs  PMH: 1. Hyperlipidemia 2. Anemia: Fe deficiency.  Presumed GI bleed requiring transfusion in 6/14, anticoagulation stopped.  3. HTN: Tolerates HCTZ poorly. 4. Osteoarthritis 5. H/o colonic polyps 6. Osteoporosis 7. GI bleed (3/12, 6/14): 3 units PRBCs, EGD with mild gastritis, colonoscopy negative, capsule endoscopy with nonspecific inflammatory changes.  6/14 GI bleeding (1/2 guaiac  +), required transfusion. Enteroscopy 7/15 showed small bowel AVMs that were cauterized.  8. CAD: LHC (1/14) with serial 30% and 60% LAD stenoses, 30% ostial RCA, EF 60%.  9. Paroxysmal atrial fibrillation.  Echo (2/14) with EF 60-65%, mild focal basal septal hypertrophy, mild AI. 10. CKD  SH: Married, lives with husband in [place].  Has children living out of town.  Nonsmoker.  Retired.   FH: CAD  ROS: All systems reviewed and negative except as per HPI.   Current Outpatient Prescriptions  Medication Sig Dispense Refill  . acetaminophen (TYLENOL) 500 MG tablet Take 1,000 mg by mouth daily as needed for mild pain. For pain/fever    . aspirin EC 81 MG tablet Take 1 tablet (81 mg total) by mouth daily.    [name] atorvastatin (LIPITOR) 20 MG tablet Take 1 tablet (20 mg total) by mouth daily. 90 tablet 1  . b complex vitamins tablet Take 1 tablet by mouth 2 (two) times a week.     . Calcium Carbonate-Vitamin D (CALTRATE 600+D) 600-400 MG-UNIT per tablet Take 1 tablet by mouth daily.      . ferrous sulfate 325 (65 FE) MG tablet Take 325 mg by mouth 2 (two) times daily with a meal.    . fluticasone (CUTIVATE) 0.05 % cream Apply 1 application topically 2 (two) times daily as needed (for leg rash).    . metoprolol succinate (TOPROL-XL) 50 MG 24 hr tablet Take 50 mg by mouth every morning. Take with or immediately following a  meal.    . Multiple Vitamin (MULTIVITAMIN) tablet Take 1 tablet by mouth daily.      . nitroGLYCERIN (NITROSTAT) 0.4 MG SL tablet Place 0.4 mg under the tongue every 5 (five) minutes as needed for chest pain.     No current facility-administered medications for this visit.    BP 160/90 mmHg  Pulse 80  Ht 5\' 1"  (1.549 m)  Wt 110 lb 12.8 oz (50.259 kg)  BMI 20.95 kg/m2  SpO2 99% General: NAD Neck: No JVD, no thyromegaly or thyroid nodule.  Lungs: Clear to auscultation bilaterally with normal respiratory effort. CV: Nondisplaced PMI.  Heart regular S1/S2, no S3/S4, no  murmur.  No peripheral edema.  No carotid bruit.  Normal pedal pulses.  Abdomen: Soft, nontender, no hepatosplenomegaly, no distention.  Neurologic: Alert and oriented x 3.  Psych: Normal affect. Extremities: No clubbing or cyanosis.   Assessment/Plan:  1. Paroxysmal atrial fibrillation: No recent tachypalpitations.  She is in NSR today.  Continue Toprol XL.  She had GI bleeding presumably from small bowel AVMs that have been cauterized.  It would be reasonable to re-consider anticoagulation. She is reticent to do this.  She is willing to try anticoagulation again if she has a recurrence of atrial fibrillation.   - CBC today.  2. CAD: Nonobstructive.  No chest pain.  Suspect elevated troponin in 1/14 episode was due to demand ischemia from atrial fibrillation with RVR.  Continue statin and ASA 81.   3. HTN: BP high today but has been ok at home.  She checks bid BP and will send me readings in 2 weeks.   4. H/o AKI: This seems to have resolved.  Will get BMET today.   Followup in 6 months.   Loralie Champagne 03/05/2014

## 2014-03-09 ENCOUNTER — Encounter (HOSPITAL_COMMUNITY): Payer: Self-pay | Admitting: Cardiology

## 2014-04-28 DIAGNOSIS — Z961 Presence of intraocular lens: Secondary | ICD-10-CM | POA: Diagnosis not present

## 2014-05-08 ENCOUNTER — Telehealth: Payer: Self-pay | Admitting: *Deleted

## 2014-05-08 ENCOUNTER — Inpatient Hospital Stay (HOSPITAL_COMMUNITY)
Admission: EM | Admit: 2014-05-08 | Discharge: 2014-05-14 | DRG: 872 | Disposition: A | Discharge status: Home / Self Care | Payer: Medicare Other | Attending: Internal Medicine | Admitting: Internal Medicine

## 2014-05-08 ENCOUNTER — Other Ambulatory Visit (INDEPENDENT_AMBULATORY_CARE_PROVIDER_SITE_OTHER): Payer: Medicare Other

## 2014-05-08 ENCOUNTER — Ambulatory Visit (INDEPENDENT_AMBULATORY_CARE_PROVIDER_SITE_OTHER): Payer: Medicare Other | Admitting: Nurse Practitioner

## 2014-05-08 ENCOUNTER — Emergency Department (HOSPITAL_COMMUNITY): Payer: Medicare Other

## 2014-05-08 ENCOUNTER — Encounter: Payer: Self-pay | Admitting: Nurse Practitioner

## 2014-05-08 ENCOUNTER — Encounter (HOSPITAL_COMMUNITY): Payer: Self-pay | Admitting: Emergency Medicine

## 2014-05-08 ENCOUNTER — Encounter: Payer: Self-pay | Admitting: *Deleted

## 2014-05-08 VITALS — BP 120/74 | HR 119 | Temp 98.6°F | Ht 64.0 in | Wt 110.8 lb

## 2014-05-08 DIAGNOSIS — Z7982 Long term (current) use of aspirin: Secondary | ICD-10-CM

## 2014-05-08 DIAGNOSIS — I10 Essential (primary) hypertension: Secondary | ICD-10-CM | POA: Diagnosis not present

## 2014-05-08 DIAGNOSIS — E860 Dehydration: Secondary | ICD-10-CM | POA: Diagnosis present

## 2014-05-08 DIAGNOSIS — R195 Other fecal abnormalities: Secondary | ICD-10-CM

## 2014-05-08 DIAGNOSIS — M810 Age-related osteoporosis without current pathological fracture: Secondary | ICD-10-CM | POA: Diagnosis present

## 2014-05-08 DIAGNOSIS — E440 Moderate protein-calorie malnutrition: Secondary | ICD-10-CM | POA: Diagnosis not present

## 2014-05-08 DIAGNOSIS — I4891 Unspecified atrial fibrillation: Secondary | ICD-10-CM | POA: Diagnosis present

## 2014-05-08 DIAGNOSIS — E871 Hypo-osmolality and hyponatremia: Secondary | ICD-10-CM | POA: Diagnosis not present

## 2014-05-08 DIAGNOSIS — D509 Iron deficiency anemia, unspecified: Secondary | ICD-10-CM | POA: Diagnosis present

## 2014-05-08 DIAGNOSIS — R000 Tachycardia, unspecified: Secondary | ICD-10-CM

## 2014-05-08 DIAGNOSIS — R197 Diarrhea, unspecified: Secondary | ICD-10-CM | POA: Diagnosis not present

## 2014-05-08 DIAGNOSIS — Z681 Body mass index (BMI) 19 or less, adult: Secondary | ICD-10-CM

## 2014-05-08 DIAGNOSIS — I2510 Atherosclerotic heart disease of native coronary artery without angina pectoris: Secondary | ICD-10-CM | POA: Diagnosis present

## 2014-05-08 DIAGNOSIS — Z79899 Other long term (current) drug therapy: Secondary | ICD-10-CM

## 2014-05-08 DIAGNOSIS — Z881 Allergy status to other antibiotic agents status: Secondary | ICD-10-CM

## 2014-05-08 DIAGNOSIS — J45909 Unspecified asthma, uncomplicated: Secondary | ICD-10-CM | POA: Diagnosis present

## 2014-05-08 DIAGNOSIS — R590 Localized enlarged lymph nodes: Secondary | ICD-10-CM | POA: Diagnosis present

## 2014-05-08 DIAGNOSIS — E876 Hypokalemia: Secondary | ICD-10-CM | POA: Diagnosis not present

## 2014-05-08 DIAGNOSIS — N179 Acute kidney failure, unspecified: Secondary | ICD-10-CM | POA: Diagnosis present

## 2014-05-08 DIAGNOSIS — Z86010 Personal history of colonic polyps: Secondary | ICD-10-CM

## 2014-05-08 DIAGNOSIS — I252 Old myocardial infarction: Secondary | ICD-10-CM

## 2014-05-08 DIAGNOSIS — M4806 Spinal stenosis, lumbar region: Secondary | ICD-10-CM | POA: Diagnosis present

## 2014-05-08 DIAGNOSIS — M479 Spondylosis, unspecified: Secondary | ICD-10-CM | POA: Diagnosis present

## 2014-05-08 DIAGNOSIS — J449 Chronic obstructive pulmonary disease, unspecified: Secondary | ICD-10-CM | POA: Diagnosis not present

## 2014-05-08 DIAGNOSIS — K529 Noninfective gastroenteritis and colitis, unspecified: Secondary | ICD-10-CM | POA: Diagnosis not present

## 2014-05-08 DIAGNOSIS — A419 Sepsis, unspecified organism: Principal | ICD-10-CM | POA: Diagnosis present

## 2014-05-08 DIAGNOSIS — R1031 Right lower quadrant pain: Secondary | ICD-10-CM | POA: Diagnosis not present

## 2014-05-08 DIAGNOSIS — E785 Hyperlipidemia, unspecified: Secondary | ICD-10-CM | POA: Diagnosis present

## 2014-05-08 DIAGNOSIS — R509 Fever, unspecified: Secondary | ICD-10-CM | POA: Diagnosis present

## 2014-05-08 DIAGNOSIS — K2970 Gastritis, unspecified, without bleeding: Secondary | ICD-10-CM | POA: Diagnosis present

## 2014-05-08 DIAGNOSIS — Z90710 Acquired absence of both cervix and uterus: Secondary | ICD-10-CM

## 2014-05-08 DIAGNOSIS — R1084 Generalized abdominal pain: Secondary | ICD-10-CM

## 2014-05-08 LAB — COMPREHENSIVE METABOLIC PANEL
ALT: 28 U/L (ref 0–35)
ANION GAP: 12 (ref 5–15)
AST: 45 U/L — ABNORMAL HIGH (ref 0–37)
Albumin: 3.6 g/dL (ref 3.5–5.2)
Alkaline Phosphatase: 77 U/L (ref 39–117)
BILIRUBIN TOTAL: 0.8 mg/dL (ref 0.3–1.2)
BUN: 10 mg/dL (ref 6–23)
CO2: 22 mmol/L (ref 19–32)
CREATININE: 1.27 mg/dL — AB (ref 0.50–1.10)
Calcium: 9.2 mg/dL (ref 8.4–10.5)
Chloride: 93 mmol/L — ABNORMAL LOW (ref 96–112)
GFR calc non Af Amer: 39 mL/min — ABNORMAL LOW (ref >90)
GFR, EST AFRICAN AMERICAN: 46 mL/min — AB (ref >90)
GLUCOSE: 153 mg/dL — AB (ref 70–99)
Potassium: 3.9 mmol/L (ref 3.5–5.1)
Sodium: 127 mmol/L — ABNORMAL LOW (ref 135–145)
TOTAL PROTEIN: 7.8 g/dL (ref 6.0–8.3)

## 2014-05-08 LAB — URINALYSIS, ROUTINE W REFLEX MICROSCOPIC
Bilirubin Urine: NEGATIVE
GLUCOSE, UA: NEGATIVE mg/dL
Ketones, ur: NEGATIVE mg/dL
Leukocytes, UA: NEGATIVE
Nitrite: NEGATIVE
Protein, ur: NEGATIVE mg/dL
Specific Gravity, Urine: 1.004 — ABNORMAL LOW (ref 1.005–1.030)
Urobilinogen, UA: 0.2 mg/dL (ref 0.0–1.0)
pH: 7 (ref 5.0–8.0)

## 2014-05-08 LAB — CBC WITH DIFFERENTIAL/PLATELET
BASOS PCT: 0 % (ref 0–1)
Basophils Absolute: 0 10*3/uL (ref 0.0–0.1)
Basophils Absolute: 0 10*3/uL (ref 0.0–0.1)
Basophils Relative: 0 % (ref 0–1)
EOS PCT: 0 % (ref 0–5)
Eosinophils Absolute: 0 10*3/uL (ref 0.0–0.7)
Eosinophils Absolute: 0 10*3/uL (ref 0.0–0.7)
Eosinophils Relative: 0 % (ref 0–5)
HCT: 35.4 % — ABNORMAL LOW (ref 36.0–46.0)
HEMATOCRIT: 30.3 % — AB (ref 36.0–46.0)
HEMOGLOBIN: 12.2 g/dL (ref 12.0–15.0)
HEMOGLOBIN: 10.5 g/dL — AB (ref 12.0–15.0)
Lymphocytes Relative: 8 % — ABNORMAL LOW (ref 12–46)
Lymphocytes Relative: 15 % (ref 12–46)
Lymphs Abs: 0.8 10*3/uL (ref 0.7–4.0)
Lymphs Abs: 1.3 10*3/uL (ref 0.7–4.0)
MCH: 31.8 pg (ref 26.0–34.0)
MCH: 31.3 pg (ref 26.0–34.0)
MCHC: 34.5 g/dL (ref 30.0–36.0)
MCHC: 34.7 g/dL (ref 30.0–36.0)
MCV: 92.2 fL (ref 78.0–100.0)
MCV: 90.4 fL (ref 78.0–100.0)
MONO ABS: 1.6 10*3/uL — AB (ref 0.1–1.0)
MONOS PCT: 18 % — AB (ref 3–12)
Monocytes Absolute: 1.5 10*3/uL — ABNORMAL HIGH (ref 0.1–1.0)
Monocytes Relative: 14 % — ABNORMAL HIGH (ref 3–12)
NEUTROS ABS: 7.8 10*3/uL — AB (ref 1.7–7.7)
Neutro Abs: 6.1 10*3/uL (ref 1.7–7.7)
Neutrophils Relative %: 78 % — ABNORMAL HIGH (ref 43–77)
Neutrophils Relative %: 67 % (ref 43–77)
PLATELETS: 188 10*3/uL (ref 150–400)
Platelets: 202 10*3/uL (ref 150–400)
RBC: 3.84 MIL/uL — AB (ref 3.87–5.11)
RBC: 3.35 MIL/uL — ABNORMAL LOW (ref 3.87–5.11)
RDW: 13.7 % (ref 11.5–15.5)
RDW: 13.8 % (ref 11.5–15.5)
WBC: 10.1 10*3/uL (ref 4.0–10.5)
WBC: 9.1 10*3/uL (ref 4.0–10.5)

## 2014-05-08 LAB — POCT URINALYSIS DIPSTICK
Bilirubin, UA: NEGATIVE
Blood, UA: POSITIVE
Glucose, UA: NEGATIVE
KETONES UA: 0.5
Leukocytes, UA: NEGATIVE
Nitrite, UA: NEGATIVE
PH UA: 6
Protein, UA: 0.3
Spec Grav, UA: 1.01
Urobilinogen, UA: NEGATIVE

## 2014-05-08 LAB — BASIC METABOLIC PANEL
Anion gap: 9 (ref 5–15)
BUN: 8 mg/dL (ref 6–23)
CALCIUM: 8.3 mg/dL — AB (ref 8.4–10.5)
CHLORIDE: 98 mmol/L (ref 96–112)
CO2: 22 mmol/L (ref 19–32)
CREATININE: 1.02 mg/dL (ref 0.50–1.10)
GFR calc Af Amer: 59 mL/min — ABNORMAL LOW (ref >90)
GFR calc non Af Amer: 51 mL/min — ABNORMAL LOW (ref >90)
Glucose, Bld: 116 mg/dL — ABNORMAL HIGH (ref 70–99)
Potassium: 3.6 mmol/L (ref 3.5–5.1)
Sodium: 129 mmol/L — ABNORMAL LOW (ref 135–145)

## 2014-05-08 LAB — PROTIME-INR
INR: 1.22 (ref 0.00–1.49)
Prothrombin Time: 15.5 seconds — ABNORMAL HIGH (ref 11.6–15.2)

## 2014-05-08 LAB — URINALYSIS, MICROSCOPIC ONLY

## 2014-05-08 LAB — I-STAT TROPONIN, ED: Troponin i, poc: 0 ng/mL (ref 0.00–0.08)

## 2014-05-08 LAB — LIPASE, BLOOD: Lipase: 26 U/L (ref 11–59)

## 2014-05-08 LAB — URINE MICROSCOPIC-ADD ON

## 2014-05-08 LAB — I-STAT CG4 LACTIC ACID, ED: Lactic Acid, Venous: 2.33 mmol/L (ref 0.5–2.0)

## 2014-05-08 LAB — HEMOCCULT GUIAC POC 1CARD (OFFICE): FECAL OCCULT BLD: POSITIVE

## 2014-05-08 LAB — LACTIC ACID, PLASMA: LACTIC ACID, VENOUS: 1.4 mmol/L (ref 0.5–2.0)

## 2014-05-08 MED ORDER — ACETAMINOPHEN 325 MG PO TABS
650.0000 mg | ORAL_TABLET | Freq: Four times a day (QID) | ORAL | Status: DC | PRN
  Administered 2014-05-08 – 2014-05-09 (×2): 650 mg via ORAL
  Filled 2014-05-08 (×6): qty 2

## 2014-05-08 MED ORDER — ACETAMINOPHEN 500 MG PO TABS: 1000.0000 mg | ORAL_TABLET | Freq: Every day | ORAL | Status: DC | PRN

## 2014-05-08 MED ORDER — NITROGLYCERIN 0.4 MG SL SUBL: 0.4000 mg | SUBLINGUAL_TABLET | SUBLINGUAL | Status: DC | PRN

## 2014-05-08 MED ORDER — ONDANSETRON HCL 4 MG PO TABS: 4.0000 mg | ORAL_TABLET | Freq: Four times a day (QID) | ORAL | Status: DC | PRN

## 2014-05-08 MED ORDER — METOPROLOL SUCCINATE ER 50 MG PO TB24
50.0000 mg | ORAL_TABLET | Freq: Every morning | ORAL | Status: DC
  Administered 2014-05-09 – 2014-05-14 (×6): 50 mg via ORAL
  Filled 2014-05-08 (×6): qty 1

## 2014-05-08 MED ORDER — SODIUM CHLORIDE 0.9 % IV SOLN
Freq: Once | INTRAVENOUS | Status: AC
  Administered 2014-05-08: 125 mL/h via INTRAVENOUS

## 2014-05-08 MED ORDER — ACETAMINOPHEN 325 MG PO TABS
650.0000 mg | ORAL_TABLET | Freq: Four times a day (QID) | ORAL | Status: DC | PRN
  Administered 2014-05-09 – 2014-05-13 (×4): 650 mg via ORAL

## 2014-05-08 MED ORDER — ATORVASTATIN CALCIUM 20 MG PO TABS
20.0000 mg | ORAL_TABLET | Freq: Every day | ORAL | Status: DC
  Administered 2014-05-09 – 2014-05-13 (×5): 20 mg via ORAL
  Filled 2014-05-08 (×6): qty 1

## 2014-05-08 MED ORDER — SODIUM CHLORIDE 0.9 % IJ SOLN
3.0000 mL | Freq: Two times a day (BID) | INTRAMUSCULAR | Status: DC
  Administered 2014-05-12 (×2): 3 mL via INTRAVENOUS

## 2014-05-08 MED ORDER — SODIUM CHLORIDE 0.9 % IV SOLN
INTRAVENOUS | Status: DC
  Administered 2014-05-08 – 2014-05-10 (×3): via INTRAVENOUS

## 2014-05-08 MED ORDER — ACETAMINOPHEN 650 MG RE SUPP: 650.0000 mg | Freq: Four times a day (QID) | RECTAL | Status: DC | PRN

## 2014-05-08 MED ORDER — ONDANSETRON HCL 4 MG/2ML IJ SOLN
4.0000 mg | Freq: Four times a day (QID) | INTRAMUSCULAR | Status: DC | PRN
Start: 2014-05-08 — End: 2014-05-14

## 2014-05-08 MED ORDER — ASPIRIN EC 81 MG PO TBEC
81.0000 mg | DELAYED_RELEASE_TABLET | Freq: Every day | ORAL | Status: DC
  Administered 2014-05-10 – 2014-05-14 (×5): 81 mg via ORAL
  Filled 2014-05-08 (×6): qty 1

## 2014-05-08 MED ORDER — SODIUM CHLORIDE 0.9 % IV BOLUS (SEPSIS)
1000.0000 mL | Freq: Once | INTRAVENOUS | Status: AC
  Administered 2014-05-08: 1000 mL via INTRAVENOUS

## 2014-05-08 MED ORDER — IOHEXOL 300 MG/ML  SOLN
25.0000 mL | INTRAMUSCULAR | Status: AC
  Administered 2014-05-08 (×2): 25 mL via ORAL

## 2014-05-08 MED ORDER — FERROUS SULFATE 325 (65 FE) MG PO TABS
325.0000 mg | ORAL_TABLET | Freq: Every evening | ORAL | Status: DC
  Filled 2014-05-08 (×3): qty 1

## 2014-05-08 MED ORDER — IOHEXOL 300 MG/ML  SOLN
80.0000 mL | Freq: Once | INTRAMUSCULAR | Status: AC | PRN
  Administered 2014-05-08: 80 mL via INTRAVENOUS

## 2014-05-08 NOTE — H&P (Signed)
[facility] History and Physical  Patient: Lindsay Doyle  MRN: [id number]  DOB: November 19, 1933  DOS: the patient was seen and examined on 05/08/2014 PCP: Cathlean Cower, MD  Chief Complaint: Diarrhea, positive Hemoccult  HPI: Etrulia B. Parks is a 81 y.o. female with Past medical history of A. fib, history of GI bleed due to AVM not on any anticoagulation, coronary artery disease, COPD, anemia. The patient presents with complaints of diarrhea ongoing for last 4 days associated with nausea and an episode of vomiting.. She denies any black color bowel movement or active blood. She had one bowel movement which did not have any evidence of for active blood or black color bowel movement. She has been feeling weak tired and lethargic and has been mostly bedbound. She mentions she has been taking all her medication. She denies any sick contacts. She denies any changes in her medication. She denies being on any antibiotics recently. With this patient was seen by her PCP who performed Hemoccult which was positive and the patient was sent here.  The patient is coming from home. And at her baseline independent for most of her ADL.  Review of Systems: as mentioned in the history of present illness.  A Comprehensive review of the other systems is negative.  Past Medical History  Diagnosis Date  . Hyperlipidemia   . Mitral valve prolapse     Remotely - last echo 2011 did not show this  . ANEMIA-IRON DEFICIENCY     presumed GI bleed 08/2012- anticoag stopped  . LEUKOPENIA, MILD 05/30/2008  . Hypertension 11/12/2006  . COPD 03/09/2007    ? pt is unsure  . OSTEOARTHRITIS 02/19/2009    Spinal  . OSTEOPOROSIS 11/12/2006  . COLONIC POLYPS, HX OF 03/09/2007  . HYPERGLYCEMIA 07/02/2007  . PSVT     a. Remotely, in setting of anemia.  [name] Positive H. pylori test 10/1996  . Cataract   . Blood transfusion     a. 05/2010: 3 transfusions for anemia/GIB.  [name] GI bleed     a. 05/2010: Hgb 7 at outside hospital s/p 3 u  PRBC, EGD 05/2010 mild gastritis, small hiatal hernia, Schatzki ring, negative colonoscopy; capsule endoscopy 06/2010 - possible nonspecific inflammatory changes.;  b.  08/2012: 1/2 guiac +; required transfusion - Apixaban d/c'd  . Hx of echocardiogram     a. echo 2/11:  EF 55-60%, mild AI, mild RAE, mild to mod TR;   b.  a. Echo 2/14:  Mild focal basal and mild LVH, EF 60-65%, mild AI, mild RAE, trivia effusion  . NSTEMI (non-ST elevated myocardial infarction) 03/2012    a. Type II in setting of AF with RVR 03/2012  . Atrial fibrillation     a. Apixaban started 03/2012 - stopped after admx for GI bleed 6/14  . CAD (coronary artery disease)     a. LHC 1/14:  mLAD serial 30 and 60%, oRCA 30%, EF 60%  . Spinal stenosis of lumbar region 07/16/2012  . Asthma    Past Surgical History  Procedure Laterality Date  . Abdominal hysterectomy    . Tonsillectomy    . Tubal ligation    . Cataract extraction    . Cardiac catheterization  1992    S/P in 1992 at [facility] in [place]. This was negative for any coronary artery disease  . Carotid duplex  08/29/2003  . Ceasarean    . Enteroscopy N/A 10/10/2013    Procedure: ENTEROSCOPY;  Surgeon: Herbie Baltimore  Shaaron Adler, MD;  Location: [facility];  Service: Endoscopy;  Laterality: N/A;  . Left heart catheterization with coronary angiogram N/A 04/23/2012    Procedure: LEFT HEART CATHETERIZATION WITH CORONARY ANGIOGRAM;  Surgeon: Larey Dresser, MD;  Location: [facility] CATH LAB;  Service: Cardiovascular;  Laterality: N/A;   Social History:  reports that she has never smoked. She has never used smokeless tobacco. She reports that she does not drink alcohol or use illicit drugs.  Allergies  Allergen Reactions  . Doxycycline Diarrhea    Possible diarrhea  . Metronidazole Other (See Comments)    Pt had difficulty swallowing this and says it was "horrible" to take. No allergic reaction.    Family History  Problem Relation Age of Onset  . Stomach  cancer Mother   . Sarcoidosis Sister   . Colon cancer Brother   . Sarcoidosis Other   . CAD Neg Hx     Prior to Admission medications   Medication Sig Start Date End Date Taking? Authorizing Provider  acetaminophen (TYLENOL) 500 MG tablet Take 1,000 mg by mouth daily as needed for mild pain. For pain/fever   Yes Historical Provider, MD  aspirin EC 81 MG tablet Take 1 tablet (81 mg total) by mouth daily. 10/12/12  Yes Scott Joylene Draft, PA-C  atorvastatin (LIPITOR) 20 MG tablet Take 1 tablet (20 mg total) by mouth daily. Patient taking differently: Take 20 mg by mouth daily at 6 PM.  01/16/14  Yes Biagio Borg, MD  b complex vitamins tablet Take 1 tablet by mouth 2 (two) times a week.    Yes Historical Provider, MD  Calcium Carbonate-Vitamin D (CALTRATE 600+D) 600-400 MG-UNIT per tablet Take 1 tablet by mouth daily.     Yes Historical Provider, MD  ferrous sulfate 325 (65 FE) MG tablet Take 325 mg by mouth every evening.    Yes Historical Provider, MD  metoprolol succinate (TOPROL-XL) 50 MG 24 hr tablet Take 50 mg by mouth every morning. Take with or immediately following a meal.   Yes Historical Provider, MD  Multiple Vitamin (MULTIVITAMIN) tablet Take 1 tablet by mouth daily.     Yes Historical Provider, MD  nitroGLYCERIN (NITROSTAT) 0.4 MG SL tablet Place 0.4 mg under the tongue every 5 (five) minutes as needed for chest pain.    Historical Provider, MD    Physical Exam: Filed Vitals:   05/08/14 1912 05/08/14 1930 05/08/14 2000 05/08/14 2042  BP: 127/77 121/73 122/68 145/83  Pulse: 97 93 91 94  Temp:      TempSrc:      Resp: 19 15 19 20   Height:      Weight:      SpO2: 99% 99% 100% 99%    General: Alert, Awake and Oriented to Time, Place and Person. Appear in mild distress Eyes: PERRL ENT: Oral Mucosa clear moist. Neck: no JVD Cardiovascular: S1 and S2 Present, no Murmur, Peripheral Pulses Present Respiratory: Bilateral Air entry equal and Decreased, Clear to Auscultation,  noCrackles, no wheezes Abdomen: Bowel Sound presnt, Soft and non tender Skin: no Rash Extremities: no Pedal edema, n calf tenderness Neurologic: Grossly no focal neuro deficit.  Labs on Admission:  CBC:  Recent Labs Lab 05/08/14 1110  WBC 10.1  NEUTROABS 7.8*  HGB 12.2  HCT 35.4*  MCV 92.2  PLT 202    CMP     Component Value Date/Time   NA 127* 05/08/2014 1110   K 3.9 05/08/2014 1110   CL 93* 05/08/2014 1110  CO2 22 05/08/2014 1110   GLUCOSE 153* 05/08/2014 1110   BUN 10 05/08/2014 1110   CREATININE 1.27* 05/08/2014 1110   CALCIUM 9.2 05/08/2014 1110   CALCIUM 10.0 09/18/2009 0002   PROT 7.8 05/08/2014 1110   ALBUMIN 3.6 05/08/2014 1110   AST 45* 05/08/2014 1110   ALT 28 05/08/2014 1110   ALKPHOS 77 05/08/2014 1110   BILITOT 0.8 05/08/2014 1110   GFRNONAA 39* 05/08/2014 1110   GFRAA 46* 05/08/2014 1110     Recent Labs Lab 05/08/14 1557  LIPASE 26    No results for input(s): CKTOTAL, CKMB, CKMBINDEX, TROPONINI in the last 168 hours. BNP (last 3 results) No results for input(s): BNP in the last 8760 hours.  ProBNP (last 3 results) No results for input(s): PROBNP in the last 8760 hours.   Radiological Exams on Admission: Ct Abdomen Pelvis W Contrast  05/08/2014   CLINICAL DATA:  Left greater than right lower abdominal pain with diarrhea for 4 days  EXAM: CT ABDOMEN AND PELVIS WITH CONTRAST  TECHNIQUE: Multidetector CT imaging of the abdomen and pelvis was performed using the standard protocol following bolus administration of intravenous contrast. Oral contrast was also administered.  CONTRAST:  34mL OMNIPAQUE IOHEXOL 300 MG/ML  SOLN  COMPARISON:  None.  FINDINGS: There is mild scarring in the lung bases. Lung bases are otherwise clear.  Liver is prominent, measuring 16.2 cm in length. There is an 8 mm probable small cyst in the posterior segment of the right lobe of the liver, mid portion. No other focal liver lesions are identified. The gallbladder wall is  not thickened. Common bile duct measures 8 mm in diameter without mass or calculus appreciable.  Spleen, pancreas, and adrenals appear within normal limits. Kidneys bilaterally show no appreciable mass or hydronephrosis on either side. There is no renal or ureteral calculus on either side.  In the pelvis, the urinary bladder is midline with normal wall thickness. There is no pelvic mass or pelvic fluid collection. Uterus is absent. The appendix appears normal.  There is generalized fold thickening throughout the stomach. There is no bowel obstruction. No free air or portal venous air.  There is no appreciable ascites, adenopathy, or abscess in the abdomen or pelvis. There is atherosclerotic change in the aorta and iliac arteries without aneurysm. There is degenerative change in the lumbar spine with rather marked narrowing at L4-5 with vacuum phenomenon at this level. There are no appreciable blastic or lytic bone lesions.  IMPRESSION: Suspect gastritis given generalized gastric fold thickening.  No bowel obstruction. No abscess. No mesenteric thickening. Appendix appears normal.  No renal or ureteral calculus.  No hydronephrosis.  Liver mildly prominent. Common bile duct measures 8 mm in diameter, without demonstrable mass or calculus.   Electronically Signed   By: Lowella Grip M.D.   On: 05/08/2014 18:58     Assessment/Plan Principal Problem:   Occult blood positive stool Active Problems:   Iron deficiency anemia   Essential hypertension   Diarrhea   Atrial fibrillation    Coronary Artery Disease   AKI (acute kidney injury)   Hyponatremia   1. Occult blood positive stool The patient is presenting with complaints of positive Hemoccult. She has history of AV malformation in the past. Dr. Deatra Ina the Gastroenterologist in the past Currently She Does Not Appear to Have Any Active Bleeding. Baseline Hemoglobin Is 10. She Appears Clinically Dehydrated and Therefore Hemoconcentrated. We Will  Recheck Her H&H and Monitor Her on Telemetry. She'll Be  Placed on Clear Liquid Diet. If There Is Further Drop in H&H Will Discuss with GI in the Morning.  2. Dehydration. Vital gastroenteritis Most likely patient's current presentation appears to be secondary to dehydration with generalized weakness. Lactic acid mildly elevated evidence of hemoconcentration mild elevation of serum creatinine. Patient will be gently hydrated overnight. Follow C. difficile.  3. Hypertension. Continue metoprolol.  4. Hyponatremia. Likely secondary to dehydration. Improving in sodium with IV hydration. Continue gentle hydration. Recheck sodium in morning. Patient does not appear to have any neurological complaint at present other than fatigue.  Advance goals of care discussion: Full code   DVT Prophylaxis: Mechanical compression Nutrition: Clear liquid diet  Disposition: Admitted to observation in telemetry unit.  Author: Berle Mull, MD [facility] Pager: [phone] 05/08/2014, 8:42 PM    If 7PM-7AM, please contact night-coverage [email or web address] Password [email or web address]

## 2014-05-08 NOTE — ED Notes (Signed)
Phlebotomy at the bedside  

## 2014-05-08 NOTE — ED Notes (Signed)
Patient placed on the bedside commode and contaminated urine specimen with bowel. Unable to use.

## 2014-05-08 NOTE — ED Notes (Signed)
NT Lindsay Doyle taking patient upstairs to [address] [place].

## 2014-05-08 NOTE — Progress Notes (Signed)
Subjective:     Lindsay Doyle is a 81 y.o. female c/o loose stools & feels cold for 3 days. She is accompanied by her husband, with whom she lives. Stools are occuring 2 to 3 times daily & are black. She says black stools are nml for her as she takes iron. Associated symptoms: crampy mild abd pain, occasional nausea relieved by peppermint & dry cough. She denies fever, anorexia, sore throat, HA, nasal congestion. She denies chest discomfort, palpitations, or SOB. She has Hx of GI bleed 3/12, 6/14-requiring 3 units PRBC, possibly due to small bowel AVM. She had repair of SB AVM 7/15.  EGD 6/'14 with mild gastritis, colonoscopy negative, capsule endoscopy with nonspecific inflammatory changes. Enteroscopy 7/15 showed small bowel AVMs that were cauterized.  She also Has hx of a-fib controlled w/metoprolol. She is not anticoagulated. Cardiology note reviewed dated Dec, 2015: CAD: LHC (1/14) with serial 30% and 60% LAD stenoses, 30% ostial RCA, EF 60%, Paroxysmal atrial fibrillation. Echo (2/14) with EF 60-65%, mild focal basal septal hypertrophy, mild AI. She also has CKD, thought due to hypoperfusion when GI bleed occurred 2014.   The following portions of the patient's history were reviewed and updated as appropriate: allergies, current medications, past family history, past medical history, past social history, past surgical history and problem list.  Review of Systems Respiratory: negative for sputum and wheezing Cardiovascular: negative for lower extremity edema and near-syncope Genitourinary:positive for dark urine, negative for dysuria    Objective:    BP 120/74 mmHg  Pulse 119  Temp(Src) 98.6 F (37 C) (Oral)  Ht 5\' 4"  (1.626 m)  Wt 110 lb 12.8 oz (50.259 kg)  BMI 19.01 kg/m2  SpO2 99% General appearance: alert, cooperative, appears stated age and no distress Head: Normocephalic, without obvious abnormality, atraumatic Eyes: negative findings: lids and lashes normal, conjunctivae  and sclerae normal, corneas clear and pupils equal, round, reactive to light and accomodation Ears: normal TM's and external ear canals both ears Throat: lips, mucosa, and tongue normal; teeth and gums normal Lungs: clear to auscultation bilaterally Heart: tachycardia, regular, no murmur Abdomen: normal findings: no masses palpable and no organomegaly and abnormal findings:  L upper & LL abd tender Extremities: extremities normal, atraumatic, no cyanosis or edema Pulses: 2+ and symmetric Neurologic: Grossly normal Rectal exam: nml tone, guaic pos   Skin: warm, brisk cap refill   Assessment:Plan  1. Tachycardia DD: dehydration, anemia - EKG 12-Lead-ST - Urine Microscopic; Future  2. Loose stools DD: gastroenteritis, diverticulitis - POCT urinalysis dipstick-SG 1.101, blood, ketones - POCT occult blood stool-POS - Urine Microscopic; Future  3. Guaiac positive stools Hx of AVM small bowel, repair 7/'15 GI bleed 2014 requiring 3 u PRBC  Go to ER for further eval

## 2014-05-08 NOTE — Patient Instructions (Signed)
It appears you are dehydrated & have blood in stool-it may be a false positive as you take iron, but due to history, you need further evaluation & likely IV fluids. Please go to ER.

## 2014-05-08 NOTE — Progress Notes (Signed)
Pre visit review using our clinic review tool, if applicable. No additional management support is needed unless otherwise documented below in the visit note. 

## 2014-05-08 NOTE — ED Notes (Signed)
NOTIFIED TColbert Ewing ,PA FOR PATIENTS LAB RESULTS OF CG4+ LACTIC ACID.

## 2014-05-08 NOTE — ED Notes (Signed)
NOTIFIED THermenia Fiscal ,PA IN PERSON FOR PATIENTS STOOL CARD RESULTS, RESULTS NOT CROSSING OVER @15 :38 PM ,05/08/2014.

## 2014-05-08 NOTE — ED Provider Notes (Signed)
CSN: [id number]     Arrival date & time 05/08/14  1044 History   First MD Initiated Contact with Patient 05/08/14 1508     Chief Complaint  Patient presents with  . Blood In Stools  . Dehydration     (Consider location/radiation/quality/duration/timing/severity/associated sxs/prior Treatment) HPI Lindsay Doyle is a 81 y.o. female with hx of afib, not anticoagulated, htn, GI bleed thought to be due to AVM, CAD, COPD, anemia, Presents to ED with complaint of chills, nausea, abdominal pain, diarrhea. Pt went to her PCP today and was sent here. Pt states she has had symptoms for about 4 days. States hands and feet are cold. States has been having 4-5 loose/watery stools. Denies blood. States her stools are normally dark bc she takes iron. Denies vomiting. No headache, neck pain or stiffness, chest pain, cough, congestion, sore throat. Denies back pain or urinary symptoms. States has not been eating well in the last several days due to nausea. Pt denies hx of similar pain in the past. No medications taken prior to arrival.   Past Medical History  Diagnosis Date  . Hyperlipidemia   . Mitral valve prolapse     Remotely - last echo 2011 did not show this  . ANEMIA-IRON DEFICIENCY     presumed GI bleed 08/2012- anticoag stopped  . LEUKOPENIA, MILD 05/30/2008  . Hypertension 11/12/2006  . COPD 03/09/2007    ? pt is unsure  . OSTEOARTHRITIS 02/19/2009    Spinal  . OSTEOPOROSIS 11/12/2006  . COLONIC POLYPS, HX OF 03/09/2007  . HYPERGLYCEMIA 07/02/2007  . PSVT     a. Remotely, in setting of anemia.  [name] Positive H. pylori test 10/1996  . Cataract   . Blood transfusion     a. 05/2010: 3 transfusions for anemia/GIB.  [name] GI bleed     a. 05/2010: Hgb 7 at outside hospital s/p 3 u PRBC, EGD 05/2010 mild gastritis, small hiatal hernia, Schatzki ring, negative colonoscopy; capsule endoscopy 06/2010 - possible nonspecific inflammatory changes.;  b.  08/2012: 1/2 guiac +; required transfusion - Apixaban d/c'd  . Hx  of echocardiogram     a. echo 2/11:  EF 55-60%, mild AI, mild RAE, mild to mod TR;   b.  a. Echo 2/14:  Mild focal basal and mild LVH, EF 60-65%, mild AI, mild RAE, trivia effusion  . NSTEMI (non-ST elevated myocardial infarction) 03/2012    a. Type II in setting of AF with RVR 03/2012  . Atrial fibrillation     a. Apixaban started 03/2012 - stopped after admx for GI bleed 6/14  . CAD (coronary artery disease)     a. LHC 1/14:  mLAD serial 30 and 60%, oRCA 30%, EF 60%  . Spinal stenosis of lumbar region 07/16/2012   Past Surgical History  Procedure Laterality Date  . Abdominal hysterectomy    . Tonsillectomy    . Tubal ligation    . Cataract extraction    . Cardiac catheterization  1992    S/P in 1992 at [facility] in [place]. This was negative for any coronary artery disease  . Carotid duplex  08/29/2003  . Ceasarean    . Enteroscopy N/A 10/10/2013    Procedure: ENTEROSCOPY;  Surgeon: Inda Castle, MD;  Location: [facility];  Service: Endoscopy;  Laterality: N/A;  . Left heart catheterization with coronary angiogram N/A 04/23/2012    Procedure: LEFT HEART CATHETERIZATION WITH CORONARY ANGIOGRAM;  Surgeon: Larey Dresser, MD;  Location: [facility];  Service: Cardiovascular;  Laterality: N/A;   Family History  Problem Relation Age of Onset  . Stomach cancer Mother   . Sarcoidosis Sister   . Colon cancer Brother   . Sarcoidosis Other   . CAD Neg Hx    History  Substance Use Topics  . Smoking status: Never Smoker   . Smokeless tobacco: Never Used  . Alcohol Use: No   OB History    No data available     Review of Systems  Constitutional: Positive for fever, chills, appetite change and fatigue.  Respiratory: Negative for cough, chest tightness and shortness of breath.   Cardiovascular: Negative for chest pain, palpitations and leg swelling.  Gastrointestinal: Positive for nausea, abdominal pain and diarrhea. Negative for vomiting.  Genitourinary:  Negative for dysuria and flank pain.  Musculoskeletal: Positive for myalgias. Negative for arthralgias, neck pain and neck stiffness.  Skin: Negative for rash.  Neurological: Positive for weakness. Negative for dizziness and headaches.  All other systems reviewed and are negative.     Allergies  Doxycycline and Metronidazole  Home Medications   Prior to Admission medications   Medication Sig Start Date End Date Taking? Authorizing Provider  acetaminophen (TYLENOL) 500 MG tablet Take 1,000 mg by mouth daily as needed for mild pain. For pain/fever   Yes Historical Provider, MD  aspirin EC 81 MG tablet Take 1 tablet (81 mg total) by mouth daily. 10/12/12  Yes Scott Joylene Draft, PA-C  atorvastatin (LIPITOR) 20 MG tablet Take 1 tablet (20 mg total) by mouth daily. Patient taking differently: Take 20 mg by mouth daily at 6 PM.  01/16/14  Yes Biagio Borg, MD  b complex vitamins tablet Take 1 tablet by mouth 2 (two) times a week.    Yes Historical Provider, MD  Calcium Carbonate-Vitamin D (CALTRATE 600+D) 600-400 MG-UNIT per tablet Take 1 tablet by mouth daily.     Yes Historical Provider, MD  ferrous sulfate 325 (65 FE) MG tablet Take 325 mg by mouth every evening.    Yes Historical Provider, MD  metoprolol succinate (TOPROL-XL) 50 MG 24 hr tablet Take 50 mg by mouth every morning. Take with or immediately following a meal.   Yes Historical Provider, MD  Multiple Vitamin (MULTIVITAMIN) tablet Take 1 tablet by mouth daily.     Yes Historical Provider, MD  nitroGLYCERIN (NITROSTAT) 0.4 MG SL tablet Place 0.4 mg under the tongue every 5 (five) minutes as needed for chest pain.    Historical Provider, MD   BP 137/96 mmHg  Pulse 116  Temp(Src) 101.2 F (38.4 C) (Oral)  Resp 14  Ht 5\' 4"  (1.626 m)  Wt 110 lb (49.896 kg)  BMI 18.87 kg/m2  SpO2 96% Physical Exam  Constitutional: She is oriented to person, place, and time. She appears well-developed and well-nourished. No distress.  HENT:  Head:  Normocephalic.  Oral mucosa dry  Eyes: Conjunctivae are normal.  Neck: Normal range of motion. Neck supple.  Cardiovascular: Normal rate and regular rhythm.   Murmur heard. Pulmonary/Chest: Effort normal and breath sounds normal. No respiratory distress. She has no wheezes. She has no rales.  Abdominal: Soft. Bowel sounds are normal. She exhibits no distension. There is tenderness. There is no rebound.  LLQ tenderness  Musculoskeletal: She exhibits no edema.  Neurological: She is alert and oriented to person, place, and time. No cranial nerve deficit. Coordination normal.  Skin: Skin is warm and dry. No pallor.  Psychiatric: She has  a normal mood and affect. Her behavior is normal.  Nursing note and vitals reviewed.   ED Course  Procedures (including critical care time) Labs Review Labs Reviewed  CBC WITH DIFFERENTIAL/PLATELET - Abnormal; Notable for the following:    RBC 3.84 (*)    HCT 35.4 (*)    Neutrophils Relative % 78 (*)    Neutro Abs 7.8 (*)    Lymphocytes Relative 8 (*)    Monocytes Relative 14 (*)    Monocytes Absolute 1.5 (*)    All other components within normal limits  COMPREHENSIVE METABOLIC PANEL - Abnormal; Notable for the following:    Sodium 127 (*)    Chloride 93 (*)    Glucose, Bld 153 (*)    Creatinine, Ser 1.27 (*)    AST 45 (*)    GFR calc non Af Amer 39 (*)    GFR calc Af Amer 46 (*)    All other components within normal limits  URINALYSIS, ROUTINE W REFLEX MICROSCOPIC - Abnormal; Notable for the following:    Specific Gravity, Urine 1.004 (*)    Hgb urine dipstick LARGE (*)    All other components within normal limits  I-STAT CG4 LACTIC ACID, ED - Abnormal; Notable for the following:    Lactic Acid, Venous 2.33 (*)    All other components within normal limits  URINE CULTURE  CULTURE, BLOOD (ROUTINE X 2)  CULTURE, BLOOD (ROUTINE X 2)  CLOSTRIDIUM DIFFICILE BY PCR  LIPASE, BLOOD  URINE MICROSCOPIC-ADD ON  POC OCCULT BLOOD, ED  I-STAT  TROPOININ, ED    Imaging Review Ct Abdomen Pelvis W Contrast  05/08/2014   CLINICAL DATA:  Left greater than right lower abdominal pain with diarrhea for 4 days  EXAM: CT ABDOMEN AND PELVIS WITH CONTRAST  TECHNIQUE: Multidetector CT imaging of the abdomen and pelvis was performed using the standard protocol following bolus administration of intravenous contrast. Oral contrast was also administered.  CONTRAST:  23mL OMNIPAQUE IOHEXOL 300 MG/ML  SOLN  COMPARISON:  None.  FINDINGS: There is mild scarring in the lung bases. Lung bases are otherwise clear.  Liver is prominent, measuring 16.2 cm in length. There is an 8 mm probable small cyst in the posterior segment of the right lobe of the liver, mid portion. No other focal liver lesions are identified. The gallbladder wall is not thickened. Common bile duct measures 8 mm in diameter without mass or calculus appreciable.  Spleen, pancreas, and adrenals appear within normal limits. Kidneys bilaterally show no appreciable mass or hydronephrosis on either side. There is no renal or ureteral calculus on either side.  In the pelvis, the urinary bladder is midline with normal wall thickness. There is no pelvic mass or pelvic fluid collection. Uterus is absent. The appendix appears normal.  There is generalized fold thickening throughout the stomach. There is no bowel obstruction. No free air or portal venous air.  There is no appreciable ascites, adenopathy, or abscess in the abdomen or pelvis. There is atherosclerotic change in the aorta and iliac arteries without aneurysm. There is degenerative change in the lumbar spine with rather marked narrowing at L4-5 with vacuum phenomenon at this level. There are no appreciable blastic or lytic bone lesions.  IMPRESSION: Suspect gastritis given generalized gastric fold thickening.  No bowel obstruction. No abscess. No mesenteric thickening. Appendix appears normal.  No renal or ureteral calculus.  No hydronephrosis.  Liver  mildly prominent. Common bile duct measures 8 mm in diameter, without demonstrable mass or  calculus.   Electronically Signed   By: Lowella Grip M.D.   On: 05/08/2014 18:58     EKG Interpretation   Date/Time:  Monday May 08 2014 15:49:00 EST Ventricular Rate:  114 PR Interval:  143 QRS Duration: 92 QT Interval:  348 QTC Calculation: 479 R Axis:   82 Text Interpretation:  Sinus tachycardia Borderline right axis deviation  Nonspecific T abnrm, anterolateral leads SINCE LAST TRACING HEART RATE HAS  INCREASED Confirmed by Debby Freiberg [phone]) on 05/08/2014 4:39:03 PM      MDM   Final diagnoses:  Enteritis  Dehydration  Hyponatremia   Pt with fever of 102.2 orally here, tachycardic in 110s, left lower quadrant abdominal pain, diarrhea for 3 days. Patient appears to be dehydrated. We'll start IV fluids, possible sepsis, blood cultures, urinalysis and urine culture was also obtained. Patient's abdomen is benign, however she is having left lower quadrant tenderness. Patient does not want any pain medications at this time, Tylenol was given for fever.  CT abdomen ordered to rule out diverticulitis in this patient with fever, dehydration, left lower quadrant pain, diarrhea.   7:14 PM CT of abdomen and pelvis is negative other than showing generalized gastric fold thickening consistent with gastritis. Patient's symptoms most likely are due to gastroenteritis. Given high fever, dehydration, slightly elevated lactic acid, will admit for observation and further treatment. Patient is receiving IV fluids. States she is feeling slightly better. Heart rate down to high 90s. Temperature improved with Tylenol now 99.  Spoke with [facility], will admit for rehydration and obs.   Filed Vitals:   05/08/14 2030 05/08/14 2042 05/08/14 2144 05/08/14 2204  BP: 145/83 145/83 133/80 155/84  Pulse: 99 94 89 101  Temp:   98.9 F (37.2 C) 98.4 F (36.9 C)  TempSrc:   Oral Oral  Resp: 21 20 20 16    Height:    5\' 4"  (1.626 m)  Weight:    102 lb 15.3 oz (46.7 kg)  SpO2: 100% 99% 99% 100%         Florene Route Ramonia Mcclaran, PA-C 05/09/14 0100  Debby Freiberg, MD 05/11/14 [phone]

## 2014-05-08 NOTE — Telephone Encounter (Signed)
[place] Night - Client [facility] Patient Name: Lindsay Doyle Gender: Female DOB: December 07, 1933 Age: 81 Y 10 M 1 D Return Phone Number: [phone] (Primary) Address: City/State/Zip: [place] Client [facility] Night - Client Client Site [place] - Night Physician Cathlean Cower Contact Type Call Call Type Triage / Clinical Relationship To Patient Self Return Phone Number [phone] (Primary) Chief Complaint Urine - unusual color Initial Comment Caller states she has loose stools and her urine is not clear. Nurse Assessment Nurse: Verlin Fester RN, [facility] Kidney Date/Time Eilene Ghazi Time): 05/08/2014 8:42:38 AM Confirm and document reason for call. If symptomatic, describe symptoms. ---Spoke with patient and she states she has already made an appointment to be seen at [place] the patient traveled out of the country within the last 30 days? ---Not Applicable Does the patient require triage? ---Declined Triage Please document clinical information provided and list any resource used. ---Offered triage and she refused Guidelines Guideline Title Affirmed Question Affirmed Notes Nurse Date/Time (Eastern Time) Disp. Time Eilene Ghazi Time) Disposition Final User 05/08/2014 8:43:55 AM Clinical Call Yes Verlin Fester, RN, Stanton Kidney After Care Instructions Given Call Event Type User Date / Time Description

## 2014-05-08 NOTE — Telephone Encounter (Signed)
Error

## 2014-05-08 NOTE — ED Notes (Signed)
Attempted BLood Draw. Unsuccessful. Phlebotomy at the bedside.

## 2014-05-08 NOTE — ED Notes (Signed)
Admitting MD at the bedside.  

## 2014-05-08 NOTE — ED Notes (Signed)
PA at the bedside.

## 2014-05-08 NOTE — ED Notes (Addendum)
Pt sent by [facility] for further eval of dehydration and blood in stool. Pt reports symptoms ongoing since Friday. Pt denies n/v. Pt told visitor that he can sit out in the waiting room, visitor did not want to leave.

## 2014-05-09 DIAGNOSIS — R509 Fever, unspecified: Secondary | ICD-10-CM | POA: Diagnosis not present

## 2014-05-09 DIAGNOSIS — K529 Noninfective gastroenteritis and colitis, unspecified: Secondary | ICD-10-CM | POA: Diagnosis not present

## 2014-05-09 DIAGNOSIS — I4891 Unspecified atrial fibrillation: Secondary | ICD-10-CM | POA: Diagnosis present

## 2014-05-09 DIAGNOSIS — K2970 Gastritis, unspecified, without bleeding: Secondary | ICD-10-CM | POA: Diagnosis present

## 2014-05-09 DIAGNOSIS — I2510 Atherosclerotic heart disease of native coronary artery without angina pectoris: Secondary | ICD-10-CM | POA: Diagnosis present

## 2014-05-09 DIAGNOSIS — R590 Localized enlarged lymph nodes: Secondary | ICD-10-CM | POA: Diagnosis not present

## 2014-05-09 DIAGNOSIS — Z7982 Long term (current) use of aspirin: Secondary | ICD-10-CM | POA: Diagnosis not present

## 2014-05-09 DIAGNOSIS — Z681 Body mass index (BMI) 19 or less, adult: Secondary | ICD-10-CM | POA: Diagnosis not present

## 2014-05-09 DIAGNOSIS — M479 Spondylosis, unspecified: Secondary | ICD-10-CM | POA: Diagnosis present

## 2014-05-09 DIAGNOSIS — J45909 Unspecified asthma, uncomplicated: Secondary | ICD-10-CM | POA: Diagnosis present

## 2014-05-09 DIAGNOSIS — R197 Diarrhea, unspecified: Secondary | ICD-10-CM | POA: Diagnosis not present

## 2014-05-09 DIAGNOSIS — J449 Chronic obstructive pulmonary disease, unspecified: Secondary | ICD-10-CM | POA: Diagnosis present

## 2014-05-09 DIAGNOSIS — E785 Hyperlipidemia, unspecified: Secondary | ICD-10-CM | POA: Diagnosis present

## 2014-05-09 DIAGNOSIS — Z79899 Other long term (current) drug therapy: Secondary | ICD-10-CM | POA: Diagnosis not present

## 2014-05-09 DIAGNOSIS — A419 Sepsis, unspecified organism: Secondary | ICD-10-CM | POA: Diagnosis present

## 2014-05-09 DIAGNOSIS — E440 Moderate protein-calorie malnutrition: Secondary | ICD-10-CM | POA: Diagnosis present

## 2014-05-09 DIAGNOSIS — N179 Acute kidney failure, unspecified: Secondary | ICD-10-CM

## 2014-05-09 DIAGNOSIS — E860 Dehydration: Secondary | ICD-10-CM | POA: Diagnosis not present

## 2014-05-09 DIAGNOSIS — I252 Old myocardial infarction: Secondary | ICD-10-CM | POA: Diagnosis not present

## 2014-05-09 DIAGNOSIS — E876 Hypokalemia: Secondary | ICD-10-CM | POA: Diagnosis not present

## 2014-05-09 DIAGNOSIS — M810 Age-related osteoporosis without current pathological fracture: Secondary | ICD-10-CM | POA: Diagnosis present

## 2014-05-09 DIAGNOSIS — D509 Iron deficiency anemia, unspecified: Secondary | ICD-10-CM | POA: Diagnosis not present

## 2014-05-09 DIAGNOSIS — Z90710 Acquired absence of both cervix and uterus: Secondary | ICD-10-CM | POA: Diagnosis not present

## 2014-05-09 DIAGNOSIS — Z86010 Personal history of colonic polyps: Secondary | ICD-10-CM | POA: Diagnosis not present

## 2014-05-09 DIAGNOSIS — Z881 Allergy status to other antibiotic agents status: Secondary | ICD-10-CM | POA: Diagnosis not present

## 2014-05-09 DIAGNOSIS — E871 Hypo-osmolality and hyponatremia: Secondary | ICD-10-CM | POA: Diagnosis present

## 2014-05-09 DIAGNOSIS — I10 Essential (primary) hypertension: Secondary | ICD-10-CM | POA: Diagnosis present

## 2014-05-09 DIAGNOSIS — R195 Other fecal abnormalities: Secondary | ICD-10-CM | POA: Diagnosis present

## 2014-05-09 DIAGNOSIS — M4806 Spinal stenosis, lumbar region: Secondary | ICD-10-CM | POA: Diagnosis present

## 2014-05-09 DIAGNOSIS — R1084 Generalized abdominal pain: Secondary | ICD-10-CM | POA: Diagnosis not present

## 2014-05-09 LAB — COMPREHENSIVE METABOLIC PANEL
ALBUMIN: 2.8 g/dL — AB (ref 3.5–5.2)
ALK PHOS: 69 U/L (ref 39–117)
ALT: 25 U/L (ref 0–35)
ANION GAP: 9 (ref 5–15)
AST: 36 U/L (ref 0–37)
BUN: 6 mg/dL (ref 6–23)
CO2: 22 mmol/L (ref 19–32)
Calcium: 8.4 mg/dL (ref 8.4–10.5)
Chloride: 103 mmol/L (ref 96–112)
Creatinine, Ser: 0.99 mg/dL (ref 0.50–1.10)
GFR calc non Af Amer: 53 mL/min — ABNORMAL LOW (ref >90)
GFR, EST AFRICAN AMERICAN: 62 mL/min — AB (ref >90)
Glucose, Bld: 124 mg/dL — ABNORMAL HIGH (ref 70–99)
Potassium: 3.4 mmol/L — ABNORMAL LOW (ref 3.5–5.1)
Sodium: 134 mmol/L — ABNORMAL LOW (ref 135–145)
Total Bilirubin: 0.3 mg/dL (ref 0.3–1.2)
Total Protein: 6.6 g/dL (ref 6.0–8.3)

## 2014-05-09 LAB — CBC WITH DIFFERENTIAL/PLATELET
Basophils Absolute: 0 10*3/uL (ref 0.0–0.1)
Basophils Relative: 0 % (ref 0–1)
EOS ABS: 0 10*3/uL (ref 0.0–0.7)
Eosinophils Relative: 0 % (ref 0–5)
HCT: 30.1 % — ABNORMAL LOW (ref 36.0–46.0)
Hemoglobin: 10.5 g/dL — ABNORMAL LOW (ref 12.0–15.0)
Lymphocytes Relative: 14 % (ref 12–46)
Lymphs Abs: 1.1 10*3/uL (ref 0.7–4.0)
MCH: 31.5 pg (ref 26.0–34.0)
MCHC: 34.9 g/dL (ref 30.0–36.0)
MCV: 90.4 fL (ref 78.0–100.0)
Monocytes Absolute: 1.5 10*3/uL — ABNORMAL HIGH (ref 0.1–1.0)
Monocytes Relative: 19 % — ABNORMAL HIGH (ref 3–12)
Neutro Abs: 5.3 10*3/uL (ref 1.7–7.7)
Neutrophils Relative %: 67 % (ref 43–77)
PLATELETS: 205 10*3/uL (ref 150–400)
RBC: 3.33 MIL/uL — AB (ref 3.87–5.11)
RDW: 13.9 % (ref 11.5–15.5)
WBC: 7.9 10*3/uL (ref 4.0–10.5)

## 2014-05-09 LAB — URINE CULTURE
COLONY COUNT: NO GROWTH
CULTURE: NO GROWTH

## 2014-05-09 LAB — CLOSTRIDIUM DIFFICILE BY PCR: Toxigenic C. Difficile by PCR: NEGATIVE

## 2014-05-09 NOTE — Progress Notes (Signed)
UR completed 

## 2014-05-09 NOTE — Progress Notes (Signed)
NP made aware of HR sustaining at 140-160. Patient have a history of afib. New order received for an EKG. Patient is assymptomatic.

## 2014-05-09 NOTE — Progress Notes (Addendum)
[facility] Progress Note   Lindsay Doyle [id number] DOB: 29-Dec-1933 DOA: 05/08/2014 PCP: Cathlean Cower, MD  Brief narrative: Lindsay Doyle is a 81 y.o. female with Past medical history of A. fib, history of GI bleed due to AVM not on any anticoagulation, coronary artery disease, COPD, anemia who presents with diarrhea and is also found to be hemeoccult positive.    Subjective: tolerating clears- last episode of diarrhea was in the middle of the night. No abdominal pain.   Assessment/Plan: Principal Problem:   Occult blood positive stool - may be related to current infection- would ask for PCP to check hemeoccult cards once acute infection resolves  Active Problems: Gastroenteritis- fever- sinus tachycardia- Sepsis - cont IVF, start on Full liquids today- C diff negative - may be a viral gastroenteritis-  - noted to have a fever this AM- cont to follow temps - no abdominal tenderness on exam- CT negative for acute illness  Gastritis? - noted to have gastric wall thickening- patient denies h/o abdominal pain or reflux when I discussed these finding with patient     Essential hypertension - cnt Metoprolol     Atrial fibrillation  - control rate with Metoprolol- not on anticoagulation?    AKI (acute kidney injury) - Creatinine now normalized- may have been due to acute infection and dehydration    Hyponatremia - sodium 127 on admission and likely due to volume depletion- improved with IVF - decrease NS to 75 cc/hr from 125cc/hr   Code Status: full code Family Communication: husband at bedside Disposition Plan: home when stable DVT prophylaxis: SCDs  Consultants:   Procedures:   Antibiotics: Anti-infectives    None         Objective: Filed Weights   05/08/14 1100 05/08/14 2204  Weight: 49.896 kg (110 lb) 46.7 kg (102 lb 15.3 oz)    Intake/Output Summary (Last 24 hours) at 05/09/14 1227 Last data filed at 05/09/14 1119  Gross per 24 hour   Intake 1456.25 ml  Output    602 ml  Net 854.25 ml     Vitals Filed Vitals:   05/08/14 2204 05/09/14 0521 05/09/14 0556 05/09/14 0835  BP: 155/84 118/65 107/61 100/56  Pulse: 101 153 134 106  Temp: 98.4 F (36.9 C) 98.4 F (36.9 C) 101.6 F (38.7 C) 98.6 F (37 C)  TempSrc: Oral Oral Oral Oral  Resp: 16 20 20 18   Height: 5\' 4"  (1.626 m)     Weight: 46.7 kg (102 lb 15.3 oz)     SpO2: 100% 98% 100% 98%    Exam: General: AAO x3, No acute respiratory distress Lungs: Clear to auscultation bilaterally without wheezes or crackles Cardiovascular: Regular rate and rhythm without murmur gallop or rub normal S1 and S2 Abdomen: Nontender, nondistended, soft, bowel sounds positive, no rebound, no ascites, no appreciable mass Extremities: No significant cyanosis, clubbing, or edema bilateral lower extremities  Data Reviewed: Basic Metabolic Panel:  Recent Labs Lab 05/08/14 1110 05/08/14 2102 05/09/14 0710  NA 127* 129* 134*  K 3.9 3.6 3.4*  CL 93* 98 103  CO2 22 22 22   GLUCOSE 153* 116* 124*  BUN 10 8 6   CREATININE 1.27* 1.02 0.99  CALCIUM 9.2 8.3* 8.4   Liver Function Tests:  Recent Labs Lab 05/08/14 1110 05/09/14 0710  AST 45* 36  ALT 28 25  ALKPHOS 77 69  BILITOT 0.8 0.3  PROT 7.8 6.6  ALBUMIN 3.6 2.8*    Recent Labs Lab 05/08/14 1557  LIPASE 26   No results for input(s): AMMONIA in the last 168 hours. CBC:  Recent Labs Lab 05/08/14 1110 05/08/14 2103 05/09/14 0710  WBC 10.1 9.1 7.9  NEUTROABS 7.8* 6.1 5.3  HGB 12.2 10.5* 10.5*  HCT 35.4* 30.3* 30.1*  MCV 92.2 90.4 90.4  PLT 202 188 205   Cardiac Enzymes: No results for input(s): CKTOTAL, CKMB, CKMBINDEX, TROPONINI in the last 168 hours. BNP (last 3 results) No results for input(s): BNP in the last 8760 hours.  ProBNP (last 3 results) No results for input(s): PROBNP in the last 8760 hours.  CBG: No results for input(s): GLUCAP in the last 168 hours.  Recent Results (from the past 240  hour(s))  Blood culture (routine x 2)     Status: None (Preliminary result)   Collection Time: 05/08/14  4:10 PM  Result Value Ref Range Status   Specimen Description BLOOD RIGHT HAND  Final   Special Requests BOTTLES DRAWN AEROBIC AND ANAEROBIC 2.5CC  Final   Culture   Final           BLOOD CULTURE RECEIVED NO GROWTH TO DATE CULTURE WILL BE HELD FOR 5 DAYS BEFORE ISSUING A FINAL NEGATIVE REPORT Performed at [facility]    Report Status PENDING  Incomplete  Clostridium Difficile by PCR     Status: None   Collection Time: 05/09/14  6:02 AM  Result Value Ref Range Status   C difficile by pcr NEGATIVE NEGATIVE Final     Studies:  Recent x-ray studies have been reviewed in detail by the Attending Physician  Scheduled Meds:  Scheduled Meds: . aspirin EC  81 mg Oral Daily  . atorvastatin  20 mg Oral q1800  . ferrous sulfate  325 mg Oral QPM  . metoprolol succinate  50 mg Oral q morning - 10a  . sodium chloride  3 mL Intravenous Q12H   Continuous Infusions: . sodium chloride 125 mL/hr at 05/09/14 0300    Time spent on care of this patient: 52 min   [place], MD 05/09/2014, 12:27 PM  LOS: 1 day   [facility] Office  [phone] Pager - Text Page per [email or web address]  If 7PM-7AM, please contact night-coverage [email or web address]

## 2014-05-09 NOTE — Progress Notes (Signed)
INITIAL NUTRITION ASSESSMENT  Pt meets criteria for NON-SEVERE (MODERATE) MALNUTRITION in the context of chronic illness as evidenced by moderate fat mass loss and moderate to severe muscle mass loss.  DOCUMENTATION CODES Per approved criteria  -Non-severe (moderate) malnutrition in the context of chronic illness   INTERVENTION: Encourage adequate PO intake.  NUTRITION DIAGNOSIS: Increased nutrient needs related to fat and muscle repletion as evidenced by estimated nutrition needs.   Goal: Pt to meet >/= 90% of their estimated nutrition needs   Monitor:  PO intake, weight trends, labs, I/O's  Reason for Assessment: Low BMI  81 y.o. female  Admitting Dx: Occult blood positive stool  ASSESSMENT: Pt with Past medical history of A. fib, history of GI bleed due to AVM not on any anticoagulation, coronary artery disease, COPD, anemia who presents with diarrhea and is also found to be hemeoccult positive.   Pt is currently on a full liquid diet. Meal completion has been 75-100%. Pt reports her appetite is good currently and PTA at home eating 3-4 meals a day. Pt reports her usual body weight is 110 lbs. Question accuracy of current weight. Pt was offered nutritional supplements however declined. Pt was encouraged to consume her food at meals.   Nutrition Focused Physical Exam:  Subcutaneous Fat:  Orbital Region: N/A Upper Arm Region: Moderate depletion Thoracic and Lumbar Region: WNL  Muscle:  Temple Region: N/A Clavicle Bone Region: Moderate to severe depletion Clavicle and Acromion Bone Region: Moderate to severe depletion Scapular Bone Region: N/A Dorsal Hand: WNL Patellar Region: Moderate depletion Anterior Thigh Region: Moderate depletion Posterior Calf Region: WNL  Edema: none  Labs: Low sodium, potassium, and GFR.  Height: Ht Readings from Last 1 Encounters:  05/08/14 5\' 4"  (1.626 m)    Weight: Wt Readings from Last 1 Encounters:  05/08/14 102 lb 15.3 oz  (46.7 kg)    Ideal Body Weight: 120 lbs  % Ideal Body Weight: 92%  Wt Readings from Last 10 Encounters:  05/08/14 102 lb 15.3 oz (46.7 kg)  05/08/14 110 lb 12.8 oz (50.259 kg)  03/03/14 110 lb 12.8 oz (50.259 kg)  01/25/14 110 lb 8 oz (50.122 kg)  10/26/13 112 lb 3.2 oz (50.894 kg)  10/22/13 112 lb 6.4 oz (50.984 kg)  09/23/13 114 lb 4 oz (51.823 kg)  09/21/13 115 lb 4 oz (52.277 kg)  08/05/13 113 lb (51.256 kg)  07/26/13 113 lb 4 oz (51.37 kg)    Usual Body Weight: 110 lbs  % Usual Body Weight: 92%  BMI:  Body mass index is 17.66 kg/(m^2). Underweight  Estimated Nutritional Needs: Kcal: 1750-1950 Protein: 65-80 grams Fluid: 1.75 - 1.95 L/day  Skin: intact  Diet Order: Diet full liquid  EDUCATION NEEDS: -No education needs identified at this time   Intake/Output Summary (Last 24 hours) at 05/09/14 1555 Last data filed at 05/09/14 1317  Gross per 24 hour  Intake 1576.25 ml  Output    602 ml  Net 974.25 ml    Last BM: 2/8  Labs:   Recent Labs Lab 05/08/14 1110 05/08/14 2102 05/09/14 0710  NA 127* 129* 134*  K 3.9 3.6 3.4*  CL 93* 98 103  CO2 22 22 22   BUN 10 8 6   CREATININE 1.27* 1.02 0.99  CALCIUM 9.2 8.3* 8.4  GLUCOSE 153* 116* 124*    CBG (last 3)  No results for input(s): GLUCAP in the last 72 hours.  Scheduled Meds: . aspirin EC  81 mg Oral Daily  .  atorvastatin  20 mg Oral q1800  . ferrous sulfate  325 mg Oral QPM  . metoprolol succinate  50 mg Oral q morning - 10a  . sodium chloride  3 mL Intravenous Q12H    Continuous Infusions: . sodium chloride 125 mL/hr at 05/09/14 0300    Past Medical History  Diagnosis Date  . Hyperlipidemia   . Mitral valve prolapse     Remotely - last echo 2011 did not show this  . ANEMIA-IRON DEFICIENCY     presumed GI bleed 08/2012- anticoag stopped  . LEUKOPENIA, MILD 05/30/2008  . Hypertension 11/12/2006  . COPD 03/09/2007    ? pt is unsure  . OSTEOARTHRITIS 02/19/2009    Spinal  . OSTEOPOROSIS  11/12/2006  . COLONIC POLYPS, HX OF 03/09/2007  . HYPERGLYCEMIA 07/02/2007  . PSVT     a. Remotely, in setting of anemia.  [name] Positive H. pylori test 10/1996  . Cataract   . Blood transfusion     a. 05/2010: 3 transfusions for anemia/GIB.  [name] GI bleed     a. 05/2010: Hgb 7 at outside hospital s/p 3 u PRBC, EGD 05/2010 mild gastritis, small hiatal hernia, Schatzki ring, negative colonoscopy; capsule endoscopy 06/2010 - possible nonspecific inflammatory changes.;  b.  08/2012: 1/2 guiac +; required transfusion - Apixaban d/c'd  . Hx of echocardiogram     a. echo 2/11:  EF 55-60%, mild AI, mild RAE, mild to mod TR;   b.  a. Echo 2/14:  Mild focal basal and mild LVH, EF 60-65%, mild AI, mild RAE, trivia effusion  . NSTEMI (non-ST elevated myocardial infarction) 03/2012    a. Type II in setting of AF with RVR 03/2012  . Atrial fibrillation     a. Apixaban started 03/2012 - stopped after admx for GI bleed 6/14  . CAD (coronary artery disease)     a. LHC 1/14:  mLAD serial 30 and 60%, oRCA 30%, EF 60%  . Spinal stenosis of lumbar region 07/16/2012  . Asthma     Past Surgical History  Procedure Laterality Date  . Abdominal hysterectomy    . Tonsillectomy    . Tubal ligation    . Cataract extraction    . Cardiac catheterization  1992    S/P in 1992 at [facility] in [place]. This was negative for any coronary artery disease  . Carotid duplex  08/29/2003  . Ceasarean    . Enteroscopy N/A 10/10/2013    Procedure: ENTEROSCOPY;  Surgeon: Inda Castle, MD;  Location: [facility];  Service: Endoscopy;  Laterality: N/A;  . Left heart catheterization with coronary angiogram N/A 04/23/2012    Procedure: LEFT HEART CATHETERIZATION WITH CORONARY ANGIOGRAM;  Surgeon: Larey Dresser, MD;  Location: [facility] CATH LAB;  Service: Cardiovascular;  Laterality: N/A;    Kallie Locks, MS, RD, LDN Pager # [phone] After hours/ weekend pager # [phone]

## 2014-05-10 ENCOUNTER — Inpatient Hospital Stay (HOSPITAL_COMMUNITY): Payer: Medicare Other

## 2014-05-10 DIAGNOSIS — R195 Other fecal abnormalities: Secondary | ICD-10-CM

## 2014-05-10 DIAGNOSIS — E440 Moderate protein-calorie malnutrition: Secondary | ICD-10-CM | POA: Diagnosis present

## 2014-05-10 LAB — MAGNESIUM: Magnesium: 1.7 mg/dL (ref 1.5–2.5)

## 2014-05-10 MED ORDER — PANTOPRAZOLE SODIUM 40 MG IV SOLR
40.0000 mg | Freq: Two times a day (BID) | INTRAVENOUS | Status: DC
  Administered 2014-05-10 – 2014-05-13 (×8): 40 mg via INTRAVENOUS
  Filled 2014-05-10 (×10): qty 40

## 2014-05-10 MED ORDER — POTASSIUM CHLORIDE CRYS ER 20 MEQ PO TBCR
40.0000 meq | EXTENDED_RELEASE_TABLET | Freq: Once | ORAL | Status: AC
  Administered 2014-05-10: 40 meq via ORAL
  Filled 2014-05-10: qty 2

## 2014-05-10 MED ORDER — METRONIDAZOLE IN NACL 5-0.79 MG/ML-% IV SOLN
500.0000 mg | Freq: Three times a day (TID) | INTRAVENOUS | Status: DC
  Administered 2014-05-10 – 2014-05-14 (×12): 500 mg via INTRAVENOUS
  Filled 2014-05-10 (×14): qty 100

## 2014-05-10 MED ORDER — CIPROFLOXACIN IN D5W 400 MG/200ML IV SOLN
400.0000 mg | Freq: Two times a day (BID) | INTRAVENOUS | Status: DC
  Administered 2014-05-10 – 2014-05-14 (×8): 400 mg via INTRAVENOUS
  Filled 2014-05-10 (×10): qty 200

## 2014-05-10 NOTE — Progress Notes (Addendum)
[facility] Progress Note   Lindsay Doyle [id number] DOB: 04-16-1933 DOA: 05/08/2014 PCP: Cathlean Cower, MD  Brief narrative: Lindsay Doyle is a 81 y.o. female with Past medical history of A. fib, history of GI bleed due to AVM not on any anticoagulation, coronary artery disease, COPD, anemia who presents with diarrhea and is also found to be hemeoccult positive.    Subjective: Patient had a high-grade fever of 101.6 last night, tachycardic, complaining of bilateral lower quadrant pain No obvious hematochezia  Assessment/Plan: Principal Problem:   Gastritis/Occult blood positive stool Started the patient on PPI could be secondary to gastritis Also started the patient on empiric antibiotics given fever,   GI pathogen panel ordered and pending Hemoglobin was 12.5> now 10.5, follow CBC GI bleed hx upon chart review      a. 05/2010: Hgb 7 at outside hospital s/p 3 u PRBC, EGD 05/2010 mild gastritis, small hiatal hernia, Schatzki ring, negative colonoscopy; capsule endoscopy 06/2010 - possible nonspecific inflammatory changes.; b. 08/2012: 1/2 guiac +; required transfusion - Apixaban d/c'd       Apixaban started 03/2012 for abif  - stopped after admx for GI bleed 6/14 Seen by Dr Deatra Ina in the past , no active bleeding ,therefore GI consult on hold Will order h pylori abx     Hyponatremia Secondary to dehydration Include improving with IV hydration  Gastroenteritis- fever- sinus tachycardia- Sepsis, we'll draw blood culture 2 - cont IVF, continue Full liquids today- C diff negative - GI pathogen panel ordered Given fever start empiric antibiotics, ciprofloxacin and Flagyl Chest x-ray negative, no other documented source of infection  Hypokalemia Replete  Acute kidney injury-prerenal creatinine 1.27 upon admission> now 0.99    Essential hypertension - cnt Metoprolol     Atrial fibrillation  - control rate with Metoprolol- not on anticoagulation due to  #1   Code Status: full code Family Communication: husband at bedside Disposition Plan: home when stable DVT prophylaxis: SCDs  Consultants:   Procedures:   Antibiotics: Anti-infectives    Start     Dose/Rate Route Frequency Ordered Stop   05/10/14 1130  ciprofloxacin (CIPRO) IVPB 400 mg     400 mg 200 mL/hr over 60 Minutes Intravenous Every 12 hours 05/10/14 1121     05/10/14 1130  metroNIDAZOLE (FLAGYL) IVPB 500 mg     500 mg 100 mL/hr over 60 Minutes Intravenous Every 8 hours 05/10/14 1121           Objective: Filed Weights   05/08/14 1100 05/08/14 2204 05/09/14 2154  Weight: 49.896 kg (110 lb) 46.7 kg (102 lb 15.3 oz) 51.3 kg (113 lb 1.5 oz)    Intake/Output Summary (Last 24 hours) at 05/10/14 1122 Last data filed at 05/10/14 0918  Gross per 24 hour  Intake 3069.17 ml  Output      0 ml  Net 3069.17 ml     Vitals Filed Vitals:   05/09/14 1647 05/09/14 2154 05/10/14 0626 05/10/14 0917  BP: 149/77 131/73 156/80 144/84  Pulse: 115 111 111 115  Temp: 103 F (39.4 C) 101.6 F (38.7 C) 98.2 F (36.8 C) 98.5 F (36.9 C)  TempSrc: Oral Oral Oral Oral  Resp: 18 17 16 18   Height:      Weight:  51.3 kg (113 lb 1.5 oz)    SpO2: 95% 98% 98% 92%    Exam: General: AAO x3, No acute respiratory distress Lungs: Clear to auscultation bilaterally without wheezes or crackles Cardiovascular: Regular rate and  rhythm without murmur gallop or rub normal S1 and S2 Abdomen: Lower abdominal pain, nondistended, soft, bowel sounds positive, no rebound, no ascites, no appreciable mass Extremities: No significant cyanosis, clubbing, or edema bilateral lower extremities  Data Reviewed: Basic Metabolic Panel:  Recent Labs Lab 05/08/14 1110 05/08/14 2102 05/09/14 0710  NA 127* 129* 134*  K 3.9 3.6 3.4*  CL 93* 98 103  CO2 22 22 22   GLUCOSE 153* 116* 124*  BUN 10 8 6   CREATININE 1.27* 1.02 0.99  CALCIUM 9.2 8.3* 8.4   Liver Function Tests:  Recent Labs Lab  05/08/14 1110 05/09/14 0710  AST 45* 36  ALT 28 25  ALKPHOS 77 69  BILITOT 0.8 0.3  PROT 7.8 6.6  ALBUMIN 3.6 2.8*    Recent Labs Lab 05/08/14 1557  LIPASE 26   No results for input(s): AMMONIA in the last 168 hours. CBC:  Recent Labs Lab 05/08/14 1110 05/08/14 2103 05/09/14 0710  WBC 10.1 9.1 7.9  NEUTROABS 7.8* 6.1 5.3  HGB 12.2 10.5* 10.5*  HCT 35.4* 30.3* 30.1*  MCV 92.2 90.4 90.4  PLT 202 188 205   Cardiac Enzymes: No results for input(s): CKTOTAL, CKMB, CKMBINDEX, TROPONINI in the last 168 hours. BNP (last 3 results) No results for input(s): BNP in the last 8760 hours.  ProBNP (last 3 results) No results for input(s): PROBNP in the last 8760 hours.  CBG: No results for input(s): GLUCAP in the last 168 hours.  Recent Results (from the past 240 hour(s))  Blood culture (routine x 2)     Status: None (Preliminary result)   Collection Time: 05/08/14  4:10 PM  Result Value Ref Range Status   Specimen Description BLOOD RIGHT HAND  Final   Special Requests BOTTLES DRAWN AEROBIC AND ANAEROBIC 2.5CC  Final   Culture   Final           BLOOD CULTURE RECEIVED NO GROWTH TO DATE CULTURE WILL BE HELD FOR 5 DAYS BEFORE ISSUING A FINAL NEGATIVE REPORT Performed at [facility]    Report Status PENDING  Incomplete  Urine culture     Status: None   Collection Time: 05/08/14  4:28 PM  Result Value Ref Range Status   Specimen Description URINE, CLEAN CATCH  Final   Special Requests NONE  Final   Colony Count NO GROWTH Performed at [facility]   Final   Culture NO GROWTH Performed at [facility]   Final   Report Status 05/09/2014 FINAL  Final  Blood culture (routine x 2)     Status: None (Preliminary result)   Collection Time: 05/08/14  5:04 PM  Result Value Ref Range Status   Specimen Description BLOOD RIGHT ARM  Final   Special Requests BOTTLES DRAWN AEROBIC AND ANAEROBIC 10CC  Final   Culture   Final           BLOOD CULTURE RECEIVED  NO GROWTH TO DATE CULTURE WILL BE HELD FOR 5 DAYS BEFORE ISSUING A FINAL NEGATIVE REPORT Performed at [facility]    Report Status PENDING  Incomplete  Clostridium Difficile by PCR     Status: None   Collection Time: 05/09/14  6:02 AM  Result Value Ref Range Status   C difficile by pcr NEGATIVE NEGATIVE Final     Studies:  Recent x-ray studies have been reviewed in detail by the Attending Physician  Scheduled Meds:  Scheduled Meds: . aspirin EC  81 mg Oral Daily  . atorvastatin  20 mg Oral q1800  . ciprofloxacin  400 mg Intravenous Q12H  . metoprolol succinate  50 mg Oral q morning - 10a  . metronidazole  500 mg Intravenous Q8H  . pantoprazole (PROTONIX) IV  40 mg Intravenous Q12H  . sodium chloride  3 mL Intravenous Q12H   Continuous Infusions: . sodium chloride 100 mL/hr at 05/10/14 [id number]    Time spent on care of this patient: 35 min   Giovanna Kemmerer, MD 05/10/2014, 11:22 AM  LOS: 1 day   [facility] Office  [phone] Pager - Text Page per [email or web address]  If 7PM-7AM, please contact night-coverage [email or web address]

## 2014-05-11 LAB — COMPREHENSIVE METABOLIC PANEL
ALT: 35 U/L (ref 0–35)
AST: 49 U/L — ABNORMAL HIGH (ref 0–37)
Albumin: 2.2 g/dL — ABNORMAL LOW (ref 3.5–5.2)
Alkaline Phosphatase: 69 U/L (ref 39–117)
Anion gap: 7 (ref 5–15)
BUN: <5 mg/dL — ABNORMAL LOW (ref 6–23)
CALCIUM: 8.1 mg/dL — AB (ref 8.4–10.5)
CO2: 25 mmol/L (ref 19–32)
CREATININE: 1.1 mg/dL (ref 0.50–1.10)
Chloride: 103 mmol/L (ref 96–112)
GFR, EST AFRICAN AMERICAN: 54 mL/min — AB (ref >90)
GFR, EST NON AFRICAN AMERICAN: 47 mL/min — AB (ref >90)
GLUCOSE: 106 mg/dL — AB (ref 70–99)
Potassium: 3.8 mmol/L (ref 3.5–5.1)
Sodium: 135 mmol/L (ref 135–145)
TOTAL PROTEIN: 5.5 g/dL — AB (ref 6.0–8.3)
Total Bilirubin: 0.4 mg/dL (ref 0.3–1.2)

## 2014-05-11 LAB — CBC
HCT: 26.7 % — ABNORMAL LOW (ref 36.0–46.0)
Hemoglobin: 9 g/dL — ABNORMAL LOW (ref 12.0–15.0)
MCH: 31.3 pg (ref 26.0–34.0)
MCHC: 33.7 g/dL (ref 30.0–36.0)
MCV: 92.7 fL (ref 78.0–100.0)
Platelets: 200 10*3/uL (ref 150–400)
RBC: 2.88 MIL/uL — ABNORMAL LOW (ref 3.87–5.11)
RDW: 14.1 % (ref 11.5–15.5)
WBC: 7.3 10*3/uL (ref 4.0–10.5)

## 2014-05-11 NOTE — Progress Notes (Signed)
[facility] Progress Note   Jaeda B. Holle TS:[id number] DOB: 12-26-1933 DOA: 05/08/2014 PCP: Cathlean Cower, MD  Brief narrative: Lindsay Doyle is a 81 y.o. female with Past medical history of A. fib, history of GI bleed due to AVM not on any anticoagulation, coronary artery disease, COPD, anemia who presents with diarrhea and is also found to be hemeoccult positive.    Subjective: The patient denies any blood in stools with BM's  No obvious hematochezia  Assessment/Plan: Principal Problem:   Gastritis/Occult blood positive stool Could be secondary to gastritis, patient started on PPI Given fevers patient started on cipro and flagyl  GI pathogen panel ordered and pending Hemoglobin was 12.5> now 10.5, follow CBC GI bleed hx upon chart review      a. 05/2010: Hgb 7 at outside hospital s/p 3 u PRBC, EGD 05/2010 mild gastritis, small hiatal hernia, Schatzki ring, negative colonoscopy; capsule endoscopy 06/2010 - possible nonspecific inflammatory changes.; b. 08/2012: 1/2 guiac +; required transfusion - Apixaban d/c'd       Apixaban started 03/2012 for abif  - stopped after admx for GI bleed 6/14 Seen by Dr Deatra Ina in the past , no active bleeding ,therefore GI consult on hold h pylori abx pending   Hyponatremia Secondary to dehydration Resolved with IV hydration  Gastroenteritis- fever- sinus tachycardia- Sepsis, we'll draw blood culture 2 - cont IVF, continue Full liquids today- C diff negative - GI pathogen panel ordered Given fever start empiric antibiotics, ciprofloxacin and Flagyl Chest x-ray negative, no other documented source of infection  Hypokalemia Replete  Acute kidney injury -Resolved and serum creatinine within normal limits    Essential hypertension -On Metoprolol     Atrial fibrillation  - control rate with Metoprolol- not on anticoagulation due to #1   Code Status: full code Family Communication: discussed directly with patient and family  member at bedside. Disposition Plan: home when stable DVT prophylaxis: SCDs  Consultants:   Procedures:   Antibiotics: Anti-infectives    Start     Dose/Rate Route Frequency Ordered Stop   05/10/14 1130  ciprofloxacin (CIPRO) IVPB 400 mg     400 mg 200 mL/hr over 60 Minutes Intravenous Every 12 hours 05/10/14 1121     05/10/14 1130  metroNIDAZOLE (FLAGYL) IVPB 500 mg     500 mg 100 mL/hr over 60 Minutes Intravenous Every 8 hours 05/10/14 1121           Objective: Filed Weights   05/08/14 2204 05/09/14 2154 05/10/14 2137  Weight: 46.7 kg (102 lb 15.3 oz) 51.3 kg (113 lb 1.5 oz) 52.4 kg (115 lb 8.3 oz)    Intake/Output Summary (Last 24 hours) at 05/11/14 1728 Last data filed at 05/11/14 0905  Gross per 24 hour  Intake    360 ml  Output   1852 ml  Net  -1492 ml     Vitals Filed Vitals:   05/10/14 1640 05/10/14 2137 05/11/14 0442 05/11/14 0845  BP: 142/86 132/63 128/69 119/65  Pulse: 121 112 103 103  Temp: 102.7 F (39.3 C) 99.5 F (37.5 C) 99.2 F (37.3 C) 99.2 F (37.3 C)  TempSrc: Oral Oral Oral Oral  Resp: 18 17 18 18   Height:      Weight:  52.4 kg (115 lb 8.3 oz)    SpO2: 97% 97% 100% 100%    Exam: General: AAO x3, No acute respiratory distress Lungs: Clear to auscultation bilaterally without wheezes or crackles Cardiovascular: Regular rate and rhythm without murmur gallop  or rub normal S1 and S2 Abdomen: Lower abdominal pain, nondistended, soft, bowel sounds positive, no rebound, no ascites, no appreciable mass Extremities: No significant cyanosis, clubbing, or edema bilateral lower extremities  Data Reviewed: Basic Metabolic Panel:  Recent Labs Lab 05/08/14 1110 05/08/14 2102 05/09/14 0710 05/10/14 0930 05/11/14 0640  NA 127* 129* 134*  --  135  K 3.9 3.6 3.4*  --  3.8  CL 93* 98 103  --  103  CO2 22 22 22   --  25  GLUCOSE 153* 116* 124*  --  106*  BUN 10 8 6   --  <5*  CREATININE 1.27* 1.02 0.99  --  1.10  CALCIUM 9.2 8.3* 8.4  --   8.1*  MG  --   --   --  1.7  --    Liver Function Tests:  Recent Labs Lab 05/08/14 1110 05/09/14 0710 05/11/14 0640  AST 45* 36 49*  ALT 28 25 35  ALKPHOS 77 69 69  BILITOT 0.8 0.3 0.4  PROT 7.8 6.6 5.5*  ALBUMIN 3.6 2.8* 2.2*    Recent Labs Lab 05/08/14 1557  LIPASE 26   No results for input(s): AMMONIA in the last 168 hours. CBC:  Recent Labs Lab 05/08/14 1110 05/08/14 2103 05/09/14 0710 05/11/14 0640  WBC 10.1 9.1 7.9 7.3  NEUTROABS 7.8* 6.1 5.3  --   HGB 12.2 10.5* 10.5* 9.0*  HCT 35.4* 30.3* 30.1* 26.7*  MCV 92.2 90.4 90.4 92.7  PLT 202 188 205 200   Cardiac Enzymes: No results for input(s): CKTOTAL, CKMB, CKMBINDEX, TROPONINI in the last 168 hours. BNP (last 3 results) No results for input(s): BNP in the last 8760 hours.  ProBNP (last 3 results) No results for input(s): PROBNP in the last 8760 hours.  CBG: No results for input(s): GLUCAP in the last 168 hours.  Recent Results (from the past 240 hour(s))  Blood culture (routine x 2)     Status: None (Preliminary result)   Collection Time: 05/08/14  4:10 PM  Result Value Ref Range Status   Specimen Description BLOOD RIGHT HAND  Final   Special Requests BOTTLES DRAWN AEROBIC AND ANAEROBIC 2.5CC  Final   Culture   Final           BLOOD CULTURE RECEIVED NO GROWTH TO DATE CULTURE WILL BE HELD FOR 5 DAYS BEFORE ISSUING A FINAL NEGATIVE REPORT Performed at [facility]    Report Status PENDING  Incomplete  Urine culture     Status: None   Collection Time: 05/08/14  4:28 PM  Result Value Ref Range Status   Specimen Description URINE, CLEAN CATCH  Final   Special Requests NONE  Final   Colony Count NO GROWTH Performed at [facility]   Final   Culture NO GROWTH Performed at [facility]   Final   Report Status 05/09/2014 FINAL  Final  Blood culture (routine x 2)     Status: None (Preliminary result)   Collection Time: 05/08/14  5:04 PM  Result Value Ref Range Status    Specimen Description BLOOD RIGHT ARM  Final   Special Requests BOTTLES DRAWN AEROBIC AND ANAEROBIC 10CC  Final   Culture   Final           BLOOD CULTURE RECEIVED NO GROWTH TO DATE CULTURE WILL BE HELD FOR 5 DAYS BEFORE ISSUING A FINAL NEGATIVE REPORT Performed at [facility]    Report Status PENDING  Incomplete  Clostridium Difficile by  PCR     Status: None   Collection Time: 05/09/14  6:02 AM  Result Value Ref Range Status   C difficile by pcr NEGATIVE NEGATIVE Final  Culture, blood (routine x 2)     Status: None (Preliminary result)   Collection Time: 05/10/14 10:24 AM  Result Value Ref Range Status   Specimen Description BLOOD RIGHT FOREARM  Final   Special Requests   Final    BOTTLES DRAWN AEROBIC AND ANAEROBIC 10CCBLUE 5CCRED   Culture   Final           BLOOD CULTURE RECEIVED NO GROWTH TO DATE CULTURE WILL BE HELD FOR 5 DAYS BEFORE ISSUING A FINAL NEGATIVE REPORT Performed at [facility]    Report Status PENDING  Incomplete  Culture, blood (routine x 2)     Status: None (Preliminary result)   Collection Time: 05/10/14 10:33 AM  Result Value Ref Range Status   Specimen Description BLOOD HAND RIGHT  Final   Special Requests   Final    BOTTLES DRAWN AEROBIC AND ANAEROBIC 10CCBLUE 5CCRED   Culture   Final           BLOOD CULTURE RECEIVED NO GROWTH TO DATE CULTURE WILL BE HELD FOR 5 DAYS BEFORE ISSUING A FINAL NEGATIVE REPORT Performed at [facility]    Report Status PENDING  Incomplete     Studies:  Recent x-ray studies have been reviewed in detail by the Attending Physician  Scheduled Meds:  Scheduled Meds: . aspirin EC  81 mg Oral Daily  . atorvastatin  20 mg Oral q1800  . ciprofloxacin  400 mg Intravenous Q12H  . metoprolol succinate  50 mg Oral q morning - 10a  . metronidazole  500 mg Intravenous Q8H  . pantoprazole (PROTONIX) IV  40 mg Intravenous Q12H  . sodium chloride  3 mL Intravenous Q12H   Continuous Infusions: . sodium  chloride 100 mL/hr at 05/10/14 2159    Time spent on care of this patient: 35 min   Velvet Bathe, MD 05/11/2014, 5:28 PM  LOS: 2 days   [facility] Office  [phone] Pager - Text Page per [email or web address]  If 7PM-7AM, please contact night-coverage [email or web address]

## 2014-05-12 ENCOUNTER — Inpatient Hospital Stay (HOSPITAL_COMMUNITY): Payer: Medicare Other

## 2014-05-12 LAB — GI PATHOGEN PANEL BY PCR, STOOL
C difficile toxin A/B: NOT DETECTED
Campylobacter by PCR: NOT DETECTED
Cryptosporidium by PCR: NOT DETECTED
E COLI (STEC): NOT DETECTED
E coli (ETEC) LT/ST: NOT DETECTED
E coli 0157 by PCR: NOT DETECTED
G lamblia by PCR: NOT DETECTED
Norovirus GI/GII: NOT DETECTED
Rotavirus A by PCR: NOT DETECTED
SALMONELLA BY PCR: NOT DETECTED
SHIGELLA BY PCR: NOT DETECTED

## 2014-05-12 LAB — HELICOBACTER PYLORI ABS-IGG+IGA, BLD: H Pylori IgG: <0.9 U/mL (ref 0.0–0.8)

## 2014-05-12 NOTE — Progress Notes (Signed)
[facility] Progress Note   Lindsay Doyle [id number] DOB: 07/30/1933 DOA: 05/08/2014 PCP: Cathlean Cower, MD  Brief narrative: Lindsay Doyle is a 81 y.o. female with Past medical history of A. fib, history of GI bleed due to AVM not on any anticoagulation, coronary artery disease, COPD, anemia who presents with diarrhea and is also found to be hemeoccult positive.    Subjective: The patient denies any blood in stools with BM's Continues to have fever, no nausea vomiting or evidence of GI bleeding Husband also by the bedside, address any concerns about management and diagnosis which is not clear at this point  No obvious hematochezia  Assessment/Plan: Principal Problem:   Gastritis/Occult blood positive stool no obvious melena or hematochezia noted this admission but FOBT positive Could be secondary to gastritis, continue PPI Given fevers patient , patient is on cipro and flagyl, Abdominal ultrasound ordered to rule out underlying gallbladder etiology although LFTs stable, rule out other causes of fever, no intra-abdominal abscess seen on CT scan  GI pathogen panel ordered and pending Hemoglobin was 12.5> now 9.0 follow CBC GI bleed hx upon chart review      a. 05/2010: Hgb 7 at outside hospital s/p 3 u PRBC, EGD 05/2010 mild gastritis, small hiatal hernia, Schatzki ring, negative colonoscopy; capsule endoscopy 06/2010 - possible nonspecific inflammatory changes.; b. 08/2012: 1/2 guiac +; required transfusion - Apixaban d/c'd       Apixaban started 03/2012 for abif  - stopped after admx for GI bleed 6/14 Seen by Dr Deatra Ina in the past , no active bleeding ,therefore GI consult on hold h pylori abx pending, patient has a history of H pylori 1998 May need outpatient endoscopy electively to evaluate gastric fold thickening     Hyponatremia Secondary to dehydration Resolved with IV hydration   Gastroenteritis/fever- sinus tachycardia- Sepsis,? Blood culture no growth so  far Patient continues to spike fever, follow GI pathogen panel C diff negative -  Continue IV hydration Continue empiric ciprofloxacin and Flagyl Chest x-ray negative, no other documented source of infection Follow respiratory panel I offered infectious disease consultation but the patient declined   Hypokalemia Replete  Acute kidney injury -Resolved and serum creatinine within normal limits    Essential hypertension -On Metoprolol     Atrial fibrillation  - control rate with Metoprolol- not on anticoagulation due to #1   Code Status: full code Family Communication: discussed directly with patient and family member at bedside. Disposition Plan: home tomorrow if ultrasound, blood culture negative DVT prophylaxis: SCDs  Consultants:   Procedures:   Antibiotics: Anti-infectives    Start     Dose/Rate Route Frequency Ordered Stop   05/10/14 1130  ciprofloxacin (CIPRO) IVPB 400 mg     400 mg 200 mL/hr over 60 Minutes Intravenous Every 12 hours 05/10/14 1121     05/10/14 1130  metroNIDAZOLE (FLAGYL) IVPB 500 mg     500 mg 100 mL/hr over 60 Minutes Intravenous Every 8 hours 05/10/14 1121           Objective: Filed Weights   05/09/14 2154 05/10/14 2137 05/11/14 2139  Weight: 51.3 kg (113 lb 1.5 oz) 52.4 kg (115 lb 8.3 oz) 52.164 kg (115 lb)    Intake/Output Summary (Last 24 hours) at 05/12/14 1210 Last data filed at 05/12/14 1037  Gross per 24 hour  Intake    760 ml  Output   2451 ml  Net  -1691 ml     Vitals Filed Vitals:  05/11/14 1827 05/11/14 2139 05/12/14 0431 05/12/14 0910  BP:  114/64 152/78 146/82  Pulse:  96 104 103  Temp: 101 F (38.3 C) 98 F (36.7 C) 99.3 F (37.4 C) 98.9 F (37.2 C)  TempSrc:  Oral Oral Oral  Resp:  17 17 18   Height:      Weight:  52.164 kg (115 lb)    SpO2:  98% 100% 98%    Exam: General: AAO x3, No acute respiratory distress Lungs: Clear to auscultation bilaterally without wheezes or crackles Cardiovascular:  Regular rate and rhythm without murmur gallop or rub normal S1 and S2 Abdomen: Lower abdominal pain, nondistended, soft, bowel sounds positive, no rebound, no ascites, no appreciable mass Extremities: No significant cyanosis, clubbing, or edema bilateral lower extremities  Data Reviewed: Basic Metabolic Panel:  Recent Labs Lab 05/08/14 1110 05/08/14 2102 05/09/14 0710 05/10/14 0930 05/11/14 0640  NA 127* 129* 134*  --  135  K 3.9 3.6 3.4*  --  3.8  CL 93* 98 103  --  103  CO2 22 22 22   --  25  GLUCOSE 153* 116* 124*  --  106*  BUN 10 8 6   --  <5*  CREATININE 1.27* 1.02 0.99  --  1.10  CALCIUM 9.2 8.3* 8.4  --  8.1*  MG  --   --   --  1.7  --    Liver Function Tests:  Recent Labs Lab 05/08/14 1110 05/09/14 0710 05/11/14 0640  AST 45* 36 49*  ALT 28 25 35  ALKPHOS 77 69 69  BILITOT 0.8 0.3 0.4  PROT 7.8 6.6 5.5*  ALBUMIN 3.6 2.8* 2.2*    Recent Labs Lab 05/08/14 1557  LIPASE 26   No results for input(s): AMMONIA in the last 168 hours. CBC:  Recent Labs Lab 05/08/14 1110 05/08/14 2103 05/09/14 0710 05/11/14 0640  WBC 10.1 9.1 7.9 7.3  NEUTROABS 7.8* 6.1 5.3  --   HGB 12.2 10.5* 10.5* 9.0*  HCT 35.4* 30.3* 30.1* 26.7*  MCV 92.2 90.4 90.4 92.7  PLT 202 188 205 200   Cardiac Enzymes: No results for input(s): CKTOTAL, CKMB, CKMBINDEX, TROPONINI in the last 168 hours. BNP (last 3 results) No results for input(s): BNP in the last 8760 hours.  ProBNP (last 3 results) No results for input(s): PROBNP in the last 8760 hours.  CBG: No results for input(s): GLUCAP in the last 168 hours.  Recent Results (from the past 240 hour(s))  Blood culture (routine x 2)     Status: None (Preliminary result)   Collection Time: 05/08/14  4:10 PM  Result Value Ref Range Status   Specimen Description BLOOD RIGHT HAND  Final   Special Requests BOTTLES DRAWN AEROBIC AND ANAEROBIC 2.5CC  Final   Culture   Final           BLOOD CULTURE RECEIVED NO GROWTH TO DATE CULTURE  WILL BE HELD FOR 5 DAYS BEFORE ISSUING A FINAL NEGATIVE REPORT Performed at [facility]    Report Status PENDING  Incomplete  Urine culture     Status: None   Collection Time: 05/08/14  4:28 PM  Result Value Ref Range Status   Specimen Description URINE, CLEAN CATCH  Final   Special Requests NONE  Final   Colony Count NO GROWTH Performed at [facility]   Final   Culture NO GROWTH Performed at [facility]   Final   Report Status 05/09/2014 FINAL  Final  Blood culture (  routine x 2)     Status: None (Preliminary result)   Collection Time: 05/08/14  5:04 PM  Result Value Ref Range Status   Specimen Description BLOOD RIGHT ARM  Final   Special Requests BOTTLES DRAWN AEROBIC AND ANAEROBIC 10CC  Final   Culture   Final           BLOOD CULTURE RECEIVED NO GROWTH TO DATE CULTURE WILL BE HELD FOR 5 DAYS BEFORE ISSUING A FINAL NEGATIVE REPORT Performed at [facility]    Report Status PENDING  Incomplete  Clostridium Difficile by PCR     Status: None   Collection Time: 05/09/14  6:02 AM  Result Value Ref Range Status   C difficile by pcr NEGATIVE NEGATIVE Final  Culture, blood (routine x 2)     Status: None (Preliminary result)   Collection Time: 05/10/14 10:24 AM  Result Value Ref Range Status   Specimen Description BLOOD RIGHT FOREARM  Final   Special Requests   Final    BOTTLES DRAWN AEROBIC AND ANAEROBIC 10CCBLUE 5CCRED   Culture   Final           BLOOD CULTURE RECEIVED NO GROWTH TO DATE CULTURE WILL BE HELD FOR 5 DAYS BEFORE ISSUING A FINAL NEGATIVE REPORT Performed at [facility]    Report Status PENDING  Incomplete  Culture, blood (routine x 2)     Status: None (Preliminary result)   Collection Time: 05/10/14 10:33 AM  Result Value Ref Range Status   Specimen Description BLOOD HAND RIGHT  Final   Special Requests   Final    BOTTLES DRAWN AEROBIC AND ANAEROBIC 10CCBLUE 5CCRED   Culture   Final           BLOOD CULTURE RECEIVED NO  GROWTH TO DATE CULTURE WILL BE HELD FOR 5 DAYS BEFORE ISSUING A FINAL NEGATIVE REPORT Performed at [facility]    Report Status PENDING  Incomplete     Studies:  Recent x-ray studies have been reviewed in detail by the Attending Physician  Scheduled Meds:  Scheduled Meds: . aspirin EC  81 mg Oral Daily  . atorvastatin  20 mg Oral q1800  . ciprofloxacin  400 mg Intravenous Q12H  . metoprolol succinate  50 mg Oral q morning - 10a  . metronidazole  500 mg Intravenous Q8H  . pantoprazole (PROTONIX) IV  40 mg Intravenous Q12H  . sodium chloride  3 mL Intravenous Q12H   Continuous Infusions: . sodium chloride 100 mL/hr at 05/11/14 2300    Time spent on care of this patient: 35 min   Breyah Akhter, MD 05/12/2014, 12:10 PM  LOS: 3 days   [facility] Office  [phone] Pager - Text Page per [email or web address]  If 7PM-7AM, please contact night-coverage [email or web address]

## 2014-05-12 NOTE — Care Management Note (Signed)
CARE MANAGEMENT NOTE 05/12/2014  Patient:  Lindsay Doyle,Lindsay B.   Account Number:  [id number]  Date Initiated:  05/12/2014  Documentation initiated by:  Charnice Zwilling  Subjective/Objective Assessment:   CM following for progression and d/c planning.     Action/Plan:   05/12/14 Met with pt and husband, no d/c needs identified at this time. IM given and explained.   Anticipated DC Date:  05/13/2014   Anticipated DC Plan:  HOME/SELF CARE         Choice offered to / List presented to:             Status of service:  In process, will continue to follow Medicare Important Message given?  YES (If response is "NO", the following Medicare IM given date fields will be blank) Date Medicare IM given:  05/12/2014 Medicare IM given by:  Lolah Coghlan Date Additional Medicare IM given:   Additional Medicare IM given by:    Discharge Disposition:    Per UR Regulation:    If discussed at Long Length of Stay Meetings, dates discussed:    Comments:

## 2014-05-13 DIAGNOSIS — R509 Fever, unspecified: Secondary | ICD-10-CM | POA: Diagnosis present

## 2014-05-13 DIAGNOSIS — R1084 Generalized abdominal pain: Secondary | ICD-10-CM | POA: Insufficient documentation

## 2014-05-13 LAB — COMPREHENSIVE METABOLIC PANEL
ALBUMIN: 2.5 g/dL — AB (ref 3.5–5.2)
ALT: 38 U/L — AB (ref 0–35)
ANION GAP: 9 (ref 5–15)
AST: 55 U/L — ABNORMAL HIGH (ref 0–37)
Alkaline Phosphatase: 65 U/L (ref 39–117)
BILIRUBIN TOTAL: 0.5 mg/dL (ref 0.3–1.2)
BUN: 5 mg/dL — ABNORMAL LOW (ref 6–23)
CHLORIDE: 100 mmol/L (ref 96–112)
CO2: 27 mmol/L (ref 19–32)
Calcium: 8.3 mg/dL — ABNORMAL LOW (ref 8.4–10.5)
Creatinine, Ser: 1.05 mg/dL (ref 0.50–1.10)
GFR calc Af Amer: 57 mL/min — ABNORMAL LOW (ref >90)
GFR, EST NON AFRICAN AMERICAN: 50 mL/min — AB (ref >90)
GLUCOSE: 106 mg/dL — AB (ref 70–99)
Potassium: 3.8 mmol/L (ref 3.5–5.1)
Sodium: 136 mmol/L (ref 135–145)
Total Protein: 6.3 g/dL (ref 6.0–8.3)

## 2014-05-13 LAB — CBC
HEMATOCRIT: 28.2 % — AB (ref 36.0–46.0)
Hemoglobin: 9.4 g/dL — ABNORMAL LOW (ref 12.0–15.0)
MCH: 31.2 pg (ref 26.0–34.0)
MCHC: 33.3 g/dL (ref 30.0–36.0)
MCV: 93.7 fL (ref 78.0–100.0)
Platelets: 255 10*3/uL (ref 150–400)
RBC: 3.01 MIL/uL — AB (ref 3.87–5.11)
RDW: 14.3 % (ref 11.5–15.5)
WBC: 6.6 10*3/uL (ref 4.0–10.5)

## 2014-05-13 LAB — RESPIRATORY VIRUS PANEL
Adenovirus: NEGATIVE
Influenza A: NEGATIVE
Influenza B: NEGATIVE
Metapneumovirus: NEGATIVE
PARAINFLUENZA 3 A: NEGATIVE
Parainfluenza 1: NEGATIVE
Parainfluenza 2: NEGATIVE
RESPIRATORY SYNCYTIAL VIRUS B: NEGATIVE
Respiratory Syncytial Virus A: NEGATIVE
Rhinovirus: NEGATIVE

## 2014-05-13 MED ORDER — ACETAMINOPHEN 325 MG PO TABS: 650.0000 mg | ORAL_TABLET | Freq: Every day | ORAL | Status: DC | PRN

## 2014-05-13 NOTE — Progress Notes (Signed)
[facility] Progress Note   Lindsay Doyle [id number] DOB: [birth year] DOA: 05/08/2014 PCP: Cathlean Cower, MD  Brief narrative: Lindsay Doyle. Um is a 81 y.o. female with Past medical history of A. fib, history of GI bleed due to AVM not on any anticoagulation, coronary artery disease, COPD, anemia who presents with diarrhea and is also found to be hemeoccult positive.    Subjective:  Cont to have fever of 100F The patient denies any blood in stools with BM's Continues to have fever, no nausea vomiting, diarrhea or evidence of GI bleeding Husband also by the bedside, address any concerns about management and diagnosis which is not clear at this point  No obvious hematochezia  Assessment/Plan: Principal Problem:   Gastritis/Occult blood positive stool no obvious melena or hematochezia noted this admission but FOBT positive Could be secondary to gastritis, continue PPI Given fevers patient , patient is on cipro and flagyl, Abdominal ultrasound ordered to rule out underlying gallbladder etiology although LFTs stable, rule out other causes of fever, no intra-abdominal abscess seen on CT scan  GI pathogen panel ordered and pending Hemoglobin was 12.5> now 9.0 follow CBC GI bleed hx upon chart review      a. 05/2010: Hgb 7 at outside hospital s/p 3 u PRBC, EGD 05/2010 mild gastritis, small hiatal hernia, Schatzki ring, negative colonoscopy; capsule endoscopy 06/2010 - possible nonspecific inflammatory changes.; b. 08/2012: 1/2 guiac +; required transfusion - Apixaban d/c'd       Apixaban started 03/2012 for abif  - stopped after admx for GI bleed 6/14 Seen by Dr Deatra Ina in the past , no active bleeding ,therefore GI consult on hold h pylori abx pending, patient has a history of H pylori 1998 May need outpatient endoscopy electively to evaluate gastric fold thickening  Fever of unknown origin Ct abd and pelvis,U/s abd neg CXR no infiltrates C.diff neg. Bl cx neg Resp pannel  neg. Ordered CMV IgM Hold tylenol for <101  Hyponatremia Secondary to dehydration Resolved with IV hydration   Gastroenteritis/fever- sinus tachycardia- Sepsis,? Blood culture no growth so far C diff negative -  Continue IV hydration Continue empiric ciprofloxacin and Flagyl  offered infectious disease consultation but the patient declined   Hypokalemia Replete  Acute kidney injury -Resolved and serum creatinine within normal limits    Essential hypertension -On Metoprolol     Atrial fibrillation  - control rate with Metoprolol- not on anticoagulation due to #1   Code Status: full code Family Communication: discussed directly with patient and family member at bedside. Disposition Plan: home tomorrow if ultrasound, blood culture negative DVT prophylaxis: SCDs  Consultants:   Procedures:   Antibiotics: Anti-infectives    Start     Dose/Rate Route Frequency Ordered Stop   05/10/14 1130  ciprofloxacin (CIPRO) IVPB 400 mg     400 mg 200 mL/hr over 60 Minutes Intravenous Every 12 hours 05/10/14 1121     05/10/14 1130  metroNIDAZOLE (FLAGYL) IVPB 500 mg     500 mg 100 mL/hr over 60 Minutes Intravenous Every 8 hours 05/10/14 1121           Objective: Filed Weights   05/09/14 2154 05/10/14 2137 05/11/14 2139  Weight: 51.3 kg (113 lb 1.5 oz) 52.4 kg (115 lb 8.3 oz) 52.164 kg (115 lb)    Intake/Output Summary (Last 24 hours) at 05/13/14 1601 Last data filed at 05/13/14 1002  Gross per 24 hour  Intake    370 ml  Output   1350 ml  Net   -980 ml     Vitals Filed Vitals:   05/12/14 2013 05/12/14 2359 05/13/14 0510 05/13/14 1231  BP: 152/73  142/78 150/87  Pulse: 118  102 105  Temp: 100.6 F (38.1 C) 98.1 F (36.7 C) 99.6 F (37.6 C) 98.1 F (36.7 C)  TempSrc: Oral Oral Oral Oral  Resp: 17  15 18   Height:      Weight:      SpO2: 98%  98% 100%    Exam: General: AAO x3, No acute respiratory distress Lungs: Clear to auscultation bilaterally without  wheezes or crackles Cardiovascular: Regular rate and rhythm without murmur gallop or rub normal S1 and S2 Abdomen: Lower abdominal pain, nondistended, soft, bowel sounds positive, no rebound, no ascites, no appreciable mass Extremities: No significant cyanosis, clubbing, or edema bilateral lower extremities  Data Reviewed: Basic Metabolic Panel:  Recent Labs Lab 05/08/14 1110 05/08/14 2102 05/09/14 0710 05/10/14 0930 05/11/14 0640 05/13/14 0434  NA 127* 129* 134*  --  135 136  K 3.9 3.6 3.4*  --  3.8 3.8  CL 93* 98 103  --  103 100  CO2 22 22 22   --  25 27  GLUCOSE 153* 116* 124*  --  106* 106*  BUN 10 8 6   --  <5* 5*  CREATININE 1.27* 1.02 0.99  --  1.10 1.05  CALCIUM 9.2 8.3* 8.4  --  8.1* 8.3*  MG  --   --   --  1.7  --   --    Liver Function Tests:  Recent Labs Lab 05/08/14 1110 05/09/14 0710 05/11/14 0640 05/13/14 0434  AST 45* 36 49* 55*  ALT 28 25 35 38*  ALKPHOS 77 69 69 65  BILITOT 0.8 0.3 0.4 0.5  PROT 7.8 6.6 5.5* 6.3  ALBUMIN 3.6 2.8* 2.2* 2.5*    Recent Labs Lab 05/08/14 1557  LIPASE 26   No results for input(s): AMMONIA in the last 168 hours. CBC:  Recent Labs Lab 05/08/14 1110 05/08/14 2103 05/09/14 0710 05/11/14 0640 05/13/14 0434  WBC 10.1 9.1 7.9 7.3 6.6  NEUTROABS 7.8* 6.1 5.3  --   --   HGB 12.2 10.5* 10.5* 9.0* 9.4*  HCT 35.4* 30.3* 30.1* 26.7* 28.2*  MCV 92.2 90.4 90.4 92.7 93.7  PLT 202 188 205 200 255   Cardiac Enzymes: No results for input(s): CKTOTAL, CKMB, CKMBINDEX, TROPONINI in the last 168 hours. BNP (last 3 results) No results for input(s): BNP in the last 8760 hours.  ProBNP (last 3 results) No results for input(s): PROBNP in the last 8760 hours.  CBG: No results for input(s): GLUCAP in the last 168 hours.  Recent Results (from the past 240 hour(s))  Blood culture (routine x 2)     Status: None (Preliminary result)   Collection Time: 05/08/14  4:10 PM  Result Value Ref Range Status   Specimen Description  BLOOD RIGHT HAND  Final   Special Requests BOTTLES DRAWN AEROBIC AND ANAEROBIC 2.5CC  Final   Culture   Final           BLOOD CULTURE RECEIVED NO GROWTH TO DATE CULTURE WILL BE HELD FOR 5 DAYS BEFORE ISSUING A FINAL NEGATIVE REPORT Performed at [facility]    Report Status PENDING  Incomplete  Urine culture     Status: None   Collection Time: 05/08/14  4:28 PM  Result Value Ref Range Status   Specimen Description URINE, CLEAN CATCH  Final   Special  Requests NONE  Final   Colony Count NO GROWTH Performed at [facility]   Final   Culture NO GROWTH Performed at [facility]   Final   Report Status 05/09/2014 FINAL  Final  Blood culture (routine x 2)     Status: None (Preliminary result)   Collection Time: 05/08/14  5:04 PM  Result Value Ref Range Status   Specimen Description BLOOD RIGHT ARM  Final   Special Requests BOTTLES DRAWN AEROBIC AND ANAEROBIC 10CC  Final   Culture   Final           BLOOD CULTURE RECEIVED NO GROWTH TO DATE CULTURE WILL BE HELD FOR 5 DAYS BEFORE ISSUING A FINAL NEGATIVE REPORT Performed at [facility]    Report Status PENDING  Incomplete  Clostridium Difficile by PCR     Status: None   Collection Time: 05/09/14  6:02 AM  Result Value Ref Range Status   C difficile by pcr NEGATIVE NEGATIVE Final  Culture, blood (routine x 2)     Status: None (Preliminary result)   Collection Time: 05/10/14 10:24 AM  Result Value Ref Range Status   Specimen Description BLOOD RIGHT FOREARM  Final   Special Requests   Final    BOTTLES DRAWN AEROBIC AND ANAEROBIC 10CCBLUE 5CCRED   Culture   Final           BLOOD CULTURE RECEIVED NO GROWTH TO DATE CULTURE WILL BE HELD FOR 5 DAYS BEFORE ISSUING A FINAL NEGATIVE REPORT Performed at [facility]    Report Status PENDING  Incomplete  Culture, blood (routine x 2)     Status: None (Preliminary result)   Collection Time: 05/10/14 10:33 AM  Result Value Ref Range Status   Specimen  Description BLOOD HAND RIGHT  Final   Special Requests   Final    BOTTLES DRAWN AEROBIC AND ANAEROBIC 10CCBLUE 5CCRED   Culture   Final           BLOOD CULTURE RECEIVED NO GROWTH TO DATE CULTURE WILL BE HELD FOR 5 DAYS BEFORE ISSUING A FINAL NEGATIVE REPORT Performed at [facility]    Report Status PENDING  Incomplete  Respiratory virus panel (routine influenza)     Status: None   Collection Time: 05/10/14 11:59 PM  Result Value Ref Range Status   Respiratory Syncytial Virus A Negative Negative Final   Respiratory Syncytial Virus B Negative Negative Final   Influenza A Negative Negative Final   Influenza B Negative Negative Final   Parainfluenza 1 Negative Negative Final   Parainfluenza 2 Negative Negative Final   Parainfluenza 3 Negative Negative Final   Metapneumovirus Negative Negative Final   Rhinovirus Negative Negative Final   Adenovirus Negative Negative Final    Comment: (NOTE) Performed At: [facility] [place], [place] [id number] Lindon Romp MD [id number]      Studies:  Recent x-ray studies have been reviewed in detail by the Attending Physician  Scheduled Meds:  Scheduled Meds: . aspirin EC  81 mg Oral Daily  . atorvastatin  20 mg Oral q1800  . ciprofloxacin  400 mg Intravenous Q12H  . metoprolol succinate  50 mg Oral q morning - 10a  . metronidazole  500 mg Intravenous Q8H  . pantoprazole (PROTONIX) IV  40 mg Intravenous Q12H  . sodium chloride  3 mL Intravenous Q12H   Continuous Infusions: . sodium chloride 10 mL/hr at 05/12/14 1215    Time spent on care  of this patient: 76 min   Kenzlie Disch, MD 05/13/2014, 4:01 PM  LOS: 4 days   [facility] Office  [phone] Pager - Text Page per [email or web address]  If 7PM-7AM, please contact night-coverage [email or web address]

## 2014-05-14 DIAGNOSIS — R509 Fever, unspecified: Secondary | ICD-10-CM

## 2014-05-14 DIAGNOSIS — E860 Dehydration: Secondary | ICD-10-CM

## 2014-05-14 DIAGNOSIS — K529 Noninfective gastroenteritis and colitis, unspecified: Secondary | ICD-10-CM | POA: Insufficient documentation

## 2014-05-14 DIAGNOSIS — R590 Localized enlarged lymph nodes: Secondary | ICD-10-CM

## 2014-05-14 DIAGNOSIS — R1084 Generalized abdominal pain: Secondary | ICD-10-CM

## 2014-05-14 LAB — CULTURE, BLOOD (ROUTINE X 2): Culture: NO GROWTH

## 2014-05-14 LAB — CMV IGM

## 2014-05-14 MED ORDER — PANTOPRAZOLE SODIUM 40 MG PO TBEC
40.0000 mg | DELAYED_RELEASE_TABLET | Freq: Every day | ORAL | Status: DC
  Administered 2014-05-14: 40 mg via ORAL
  Filled 2014-05-14: qty 1

## 2014-05-14 MED ORDER — PANTOPRAZOLE SODIUM 40 MG PO TBEC: 40.0000 mg | DELAYED_RELEASE_TABLET | Freq: Every day | ORAL | Status: DC

## 2014-05-14 NOTE — Discharge Summary (Signed)
Physician Discharge Summary  Lindsay Doyle [id number] DOB: [birth year] DOA: 05/08/2014  PCP: Cathlean Cower, MD  Admit date: 05/08/2014 Discharge date: 05/14/2014  Time spent: 40 minutes  Recommendations for Outpatient Follow-up:  1. F/u with Dr. Tommy Medal in 3-5 days 2. F/U with PCP in 1 week    Discharge Diagnoses:  Principal Problem:   Fever of unknown origin Active Problems:   Iron deficiency anemia   Essential hypertension   Diarrhea   Atrial fibrillation    Coronary Artery Disease   AKI (acute kidney injury)   Occult blood positive stool   Hyponatremia   Gastroenteritis   Malnutrition of moderate degree   Abdominal pain, generalized   Dehydration   Discharge Condition: Stable  Diet recommendation: cardiac diet  Filed Weights   05/10/14 2137 05/11/14 2139 05/13/14 2015  Weight: 52.4 kg (115 lb 8.3 oz) 52.164 kg (115 lb) 51.665 kg (113 lb 14.4 oz)    History of present illness:  Lindsay Doyle is a 81 y.o. female with Past medical history of A. fib, history of GI bleed due to AVM not on any anticoagulation, coronary artery disease, COPD, anemia  presents with complaints of diarrhea ongoing for last 4 days associated with nausea and an episode of vomiting, did not have any evidence of for active blood or black color bowel movement, feeling weak tired and lethargic and has been mostly bedbound. She mentions she has been taking all her medication. She denies any sick contacts. She denies any changes in her medication. She denies being on any antibiotics recently.With this patient was seen by her PCP who performed Hemoccult which was positive and the patient was sent to [facility] Course:  Gastritis/Occult blood positive stool no obvious melena or hematochezia noted this admission but FOBT positive Could be secondary to gastritis, continue PPI oncdaily. .Given fevers of unknown etioology t , patient was on cipro and flagyl. Abdominal ultrasound, CT abd and pelvis were  unremarkable for intraabdominal abscess or cholecystitis. LFTs normal. C.diff toxin negative. Hemoglobin was 12.5>  9.0 and remined stable at 9. h pylori neg. Tolerating diet well. No episodes of nausea, vomiting or diarrhea. Ordered CMV IG M pending, Resp pannel negative.CXR no infiltrates.l;ood urine cultures negative. Consulted ID who recommended to discontinue and antibiotics and D?C patient home and f/u with Dr. Tommy Medal as oupatient.  Hyponatremia Secondary to dehydration.Resolved with IV hydration.  Hypokalemia Repleted  Acute kidney injury -Resolved and serum creatinine within normal limits   Essential hypertension-On Metoprolol    Atrial fibrillation - control rate with Metoprolol- not on anticoagulation due to #1         Procedures:  Ct abd and pelvis , [place] of RUQ, Chest Xray.  Consultations:  ID  Discharge Exam: Filed Vitals:   05/14/14 0812  BP: 133/74  Pulse: 103  Temp: 98.4 F (36.9 C)  Resp: 16    Current Discharge Medication List    START taking these medications   Details  pantoprazole (PROTONIX) 40 MG tablet Take 1 tablet (40 mg total) by mouth daily. Qty: 30 tablet, Refills: 0      CONTINUE these medications which have NOT CHANGED   Details  aspirin EC 81 MG tablet Take 1 tablet (81 mg total) by mouth daily.   Associated Diagnoses: Coronary atherosclerosis of native coronary artery    atorvastatin (LIPITOR) 20 MG tablet Take 1 tablet (20 mg total) by mouth daily. Qty: 90 tablet, Refills: 1    b complex  vitamins tablet Take 1 tablet by mouth 2 (two) times a week.     Calcium Carbonate-Vitamin D (CALTRATE 600+D) 600-400 MG-UNIT per tablet Take 1 tablet by mouth daily.      ferrous sulfate 325 (65 FE) MG tablet Take 325 mg by mouth every evening.     metoprolol succinate (TOPROL-XL) 50 MG 24 hr tablet Take 50 mg by mouth every morning. Take with or immediately following a meal.    Multiple Vitamin (MULTIVITAMIN) tablet Take 1 tablet by mouth  daily.        STOP taking these medications     acetaminophen (TYLENOL) 500 MG tablet      nitroGLYCERIN (NITROSTAT) 0.4 MG SL tablet        Allergies  Allergen Reactions  . Doxycycline Diarrhea    Possible diarrhea  . Metronidazole Other (See Comments)    Pt had difficulty swallowing this and says it was "horrible" to take. No allergic reaction.   Follow-up Information    Follow up with Cathlean Cower, MD. Schedule an appointment as soon as possible for a visit in 1 week.   Specialties:  Internal Medicine, Radiology   Contact information:   [place] [address] [phone]       Follow up with Alcide Evener, MD. Schedule an appointment as soon as possible for a visit in 3 days.   Specialty:  Infectious Diseases   Contact information:   [address] [place] [address] [phone]        The results of significant diagnostics from this hospitalization (including imaging, microbiology, ancillary and laboratory) are listed below for reference.    Significant Diagnostic Studies: Dg Chest 2 View  05/10/2014   CLINICAL DATA:  Diarrhea for 5 days, fever  EXAM: CHEST  2 VIEW  COMPARISON:  10/26/2013  FINDINGS: Cardiac shadow is stable but mildly enlarged. The lungs are well aerated bilaterally. No focal infiltrate or sizable effusion is noted. Mild apical scarring is again seen on the right. No acute bony abnormality is seen.  IMPRESSION: Stable cardiomegaly.  No acute abnormality is seen.   Electronically Signed   By: Inez Catalina M.D.   On: 05/10/2014 09:40   US Abdomen Complete  05/12/2014   CLINICAL DATA:  Patient with 4 days of generalized abdominal pain.  EXAM: ULTRASOUND ABDOMEN COMPLETE  COMPARISON:  CT 05/08/2014  FINDINGS: Gallbladder: Minimal amount of sludge within the gallbladder lumen. No gallbladder wall thickening or pericholecystic fluid. Negative sonographic Murphy sign.  Common bile duct: Diameter: 3.3 mm.   Liver: No focal lesion identified. Within normal limits in parenchymal echogenicity.  IVC: No abnormality visualized.  Pancreas: Visualized portion unremarkable.  Spleen: Size and appearance within normal limits.  Right Kidney: Length: 10.0 cm. Echogenicity within normal limits. No mass or hydronephrosis visualized.  Left Kidney: Length: 10.6 cm. Echogenicity within normal limits. No mass or hydronephrosis visualized.  Abdominal aorta: No aneurysm visualized.  Other findings: None.  IMPRESSION: Minimal gallbladder sludge.  Otherwise unremarkable abdominal ultrasound.   Electronically Signed   By: Lovey Newcomer M.D.   On: 05/12/2014 16:03   Ct Abdomen Pelvis W Contrast  05/08/2014   CLINICAL DATA:  Left greater than right lower abdominal pain with diarrhea for 4 days  EXAM: CT ABDOMEN AND PELVIS WITH CONTRAST  TECHNIQUE: Multidetector CT imaging of the abdomen and pelvis was performed using the standard protocol following bolus administration of intravenous contrast. Oral contrast  was also administered.  CONTRAST:  36mL OMNIPAQUE IOHEXOL 300 MG/ML  SOLN  COMPARISON:  None.  FINDINGS: There is mild scarring in the lung bases. Lung bases are otherwise clear.  Liver is prominent, measuring 16.2 cm in length. There is an 8 mm probable small cyst in the posterior segment of the right lobe of the liver, mid portion. No other focal liver lesions are identified. The gallbladder wall is not thickened. Common bile duct measures 8 mm in diameter without mass or calculus appreciable.  Spleen, pancreas, and adrenals appear within normal limits. Kidneys bilaterally show no appreciable mass or hydronephrosis on either side. There is no renal or ureteral calculus on either side.  In the pelvis, the urinary bladder is midline with normal wall thickness. There is no pelvic mass or pelvic fluid collection. Uterus is absent. The appendix appears normal.  There is generalized fold thickening throughout the stomach. There is no bowel  obstruction. No free air or portal venous air.  There is no appreciable ascites, adenopathy, or abscess in the abdomen or pelvis. There is atherosclerotic change in the aorta and iliac arteries without aneurysm. There is degenerative change in the lumbar spine with rather marked narrowing at L4-5 with vacuum phenomenon at this level. There are no appreciable blastic or lytic bone lesions.  IMPRESSION: Suspect gastritis given generalized gastric fold thickening.  No bowel obstruction. No abscess. No mesenteric thickening. Appendix appears normal.  No renal or ureteral calculus.  No hydronephrosis.  Liver mildly prominent. Common bile duct measures 8 mm in diameter, without demonstrable mass or calculus.   Electronically Signed   By: Lowella Grip M.D.   On: 05/08/2014 18:58    Microbiology: Recent Results (from the past 240 hour(s))  Blood culture (routine x 2)     Status: None (Preliminary result)   Collection Time: 05/08/14  4:10 PM  Result Value Ref Range Status   Specimen Description BLOOD RIGHT HAND  Final   Special Requests BOTTLES DRAWN AEROBIC AND ANAEROBIC 2.5CC  Final   Culture   Final           BLOOD CULTURE RECEIVED NO GROWTH TO DATE CULTURE WILL BE HELD FOR 5 DAYS BEFORE ISSUING A FINAL NEGATIVE REPORT Performed at [facility]    Report Status PENDING  Incomplete  Urine culture     Status: None   Collection Time: 05/08/14  4:28 PM  Result Value Ref Range Status   Specimen Description URINE, CLEAN CATCH  Final   Special Requests NONE  Final   Colony Count NO GROWTH Performed at [facility]   Final   Culture NO GROWTH Performed at [facility]   Final   Report Status 05/09/2014 FINAL  Final  Blood culture (routine x 2)     Status: None (Preliminary result)   Collection Time: 05/08/14  5:04 PM  Result Value Ref Range Status   Specimen Description BLOOD RIGHT ARM  Final   Special Requests BOTTLES DRAWN AEROBIC AND ANAEROBIC 10CC  Final   Culture    Final           BLOOD CULTURE RECEIVED NO GROWTH TO DATE CULTURE WILL BE HELD FOR 5 DAYS BEFORE ISSUING A FINAL NEGATIVE REPORT Performed at [facility]    Report Status PENDING  Incomplete  Clostridium Difficile by PCR     Status: None   Collection Time: 05/09/14  6:02 AM  Result Value Ref Range Status   C difficile by pcr  NEGATIVE NEGATIVE Final  Culture, blood (routine x 2)     Status: None (Preliminary result)   Collection Time: 05/10/14 10:24 AM  Result Value Ref Range Status   Specimen Description BLOOD RIGHT FOREARM  Final   Special Requests   Final    BOTTLES DRAWN AEROBIC AND ANAEROBIC 10CCBLUE 5CCRED   Culture   Final           BLOOD CULTURE RECEIVED NO GROWTH TO DATE CULTURE WILL BE HELD FOR 5 DAYS BEFORE ISSUING A FINAL NEGATIVE REPORT Performed at [facility]    Report Status PENDING  Incomplete  Culture, blood (routine x 2)     Status: None (Preliminary result)   Collection Time: 05/10/14 10:33 AM  Result Value Ref Range Status   Specimen Description BLOOD HAND RIGHT  Final   Special Requests   Final    BOTTLES DRAWN AEROBIC AND ANAEROBIC 10CCBLUE 5CCRED   Culture   Final           BLOOD CULTURE RECEIVED NO GROWTH TO DATE CULTURE WILL BE HELD FOR 5 DAYS BEFORE ISSUING A FINAL NEGATIVE REPORT Performed at [facility]    Report Status PENDING  Incomplete  Respiratory virus panel (routine influenza)     Status: None   Collection Time: 05/10/14 11:59 PM  Result Value Ref Range Status   Respiratory Syncytial Virus A Negative Negative Final   Respiratory Syncytial Virus B Negative Negative Final   Influenza A Negative Negative Final   Influenza B Negative Negative Final   Parainfluenza 1 Negative Negative Final   Parainfluenza 2 Negative Negative Final   Parainfluenza 3 Negative Negative Final   Metapneumovirus Negative Negative Final   Rhinovirus Negative Negative Final   Adenovirus Negative Negative Final    Comment: (NOTE) Performed  At: [facility] [place], [place] [id number] Lindon Romp MD [id number]      Labs: Basic Metabolic Panel:  Recent Labs Lab 05/08/14 1110 05/08/14 2102 05/09/14 0710 05/10/14 0930 05/11/14 0640 05/13/14 0434  NA 127* 129* 134*  --  135 136  K 3.9 3.6 3.4*  --  3.8 3.8  CL 93* 98 103  --  103 100  CO2 22 22 22   --  25 27  GLUCOSE 153* 116* 124*  --  106* 106*  BUN 10 8 6   --  <5* 5*  CREATININE 1.27* 1.02 0.99  --  1.10 1.05  CALCIUM 9.2 8.3* 8.4  --  8.1* 8.3*  MG  --   --   --  1.7  --   --    Liver Function Tests:  Recent Labs Lab 05/08/14 1110 05/09/14 0710 05/11/14 0640 05/13/14 0434  AST 45* 36 49* 55*  ALT 28 25 35 38*  ALKPHOS 77 69 69 65  BILITOT 0.8 0.3 0.4 0.5  PROT 7.8 6.6 5.5* 6.3  ALBUMIN 3.6 2.8* 2.2* 2.5*    Recent Labs Lab 05/08/14 1557  LIPASE 26   No results for input(s): AMMONIA in the last 168 hours. CBC:  Recent Labs Lab 05/08/14 1110 05/08/14 2103 05/09/14 0710 05/11/14 0640 05/13/14 0434  WBC 10.1 9.1 7.9 7.3 6.6  NEUTROABS 7.8* 6.1 5.3  --   --   HGB 12.2 10.5* 10.5* 9.0* 9.4*  HCT 35.4* 30.3* 30.1* 26.7* 28.2*  MCV 92.2 90.4 90.4 92.7 93.7  PLT 202 188 205 200 255   Cardiac Enzymes: No results for input(s): CKTOTAL, CKMB, CKMBINDEX, TROPONINI in the last  168 hours. BNP: BNP (last 3 results) No results for input(s): BNP in the last 8760 hours.  ProBNP (last 3 results) No results for input(s): PROBNP in the last 8760 hours.  CBG: No results for input(s): GLUCAP in the last 168 hours.     Signed:  [id number]  [facility] 05/14/2014, 2:13 PM

## 2014-05-14 NOTE — Consult Note (Signed)
[facility]    Date of Admission:  05/08/2014  Date of Consult:  05/14/2014  Reason for Consult: Fever of unknown origin Referring Physician: Dr Lunette Stands   HPI: Lindsay Doyle is an 81 y.o. female with a past medical  history of A. fib, history of GI bleed due to AVM not on any anticoagulation, coronary artery disease, COPD, anemia who was admitted on February 8. She states that the Friday prior to admission she had acute onset of intense fevers and drenching sweats chills malaise nausea. She had some loose stools and an episode of emesis. Stools were dark colored but this was attributed to her iron by the patient. He was seen by her primary care physician's office who are concerned by her presentation where she was tachycardic and apparently dehydrated with changes on EKG. She was admitted to the hospital for further workup.workup that included urinalysis which was negative and (note there were no urinary symptoms I don't really understand why urinalysis was performed) urine cultures blood cultures were negative.  DT of the abdomen and pelvis showed evidence of gastritis and a mildly prominent liver. She had chest x-ray which was negative as well.  11 Bactrim pylori antibodies were negative.  Patient had been in the hospital since the eighth and was placed on metronidazole and ciprofloxacin on the 10th she which she received to the 13th. She was still having fevers despite these antibiotics and we were therefore consult to assist in the management and workup of her fevers of unknown origin. She herself states that she feels much better than when she had met was admitted to the hospital and feels that her dehydration has resolved her nausea is resolved she is not having loose stools. She is emphatic about wanting to complete her workup for fever of unknown origin as an outpatient.  In reviewing her travel history she has lived only in the [place] and largely  [place] with only a trip to the [place] area once in the past.  She has no history of tuberculosis exposure.      Past Medical History  Diagnosis Date  . Hyperlipidemia   . Mitral valve prolapse     Remotely - last echo 2011 did not show this  . ANEMIA-IRON DEFICIENCY     presumed GI bleed 08/2012- anticoag stopped  . LEUKOPENIA, MILD 05/30/2008  . Hypertension 11/12/2006  . COPD 03/09/2007    ? pt is unsure  . OSTEOARTHRITIS 02/19/2009    Spinal  . OSTEOPOROSIS 11/12/2006  . COLONIC POLYPS, HX OF 03/09/2007  . HYPERGLYCEMIA 07/02/2007  . PSVT     a. Remotely, in setting of anemia.  [name] Positive H. pylori test 10/1996  . Cataract   . Blood transfusion     a. 05/2010: 3 transfusions for anemia/GIB.  [name] GI bleed     a. 05/2010: Hgb 7 at outside hospital s/p 3 u PRBC, EGD 05/2010 mild gastritis, small hiatal hernia, Schatzki ring, negative colonoscopy; capsule endoscopy 06/2010 - possible nonspecific inflammatory changes.;  b.  08/2012: 1/2 guiac +; required transfusion - Apixaban d/c'd  . Hx of echocardiogram     a. echo 2/11:  EF 55-60%, mild AI, mild RAE, mild to mod TR;   b.  a. Echo 2/14:  Mild focal basal and mild LVH, EF 60-65%, mild AI, mild RAE, trivia effusion  . NSTEMI (non-ST elevated myocardial infarction) 03/2012    a. Type II in setting of AF with  RVR 03/2012  . Atrial fibrillation     a. Apixaban started 03/2012 - stopped after admx for GI bleed 6/14  . CAD (coronary artery disease)     a. LHC 1/14:  mLAD serial 30 and 60%, oRCA 30%, EF 60%  . Spinal stenosis of lumbar region 07/16/2012  . Asthma     Past Surgical History  Procedure Laterality Date  . Abdominal hysterectomy    . Tonsillectomy    . Tubal ligation    . Cataract extraction    . Cardiac catheterization  1992    S/P in 1992 at [facility] in [place]. This was negative for any coronary artery disease  . Carotid duplex  08/29/2003  . Ceasarean    . Enteroscopy N/A 10/10/2013     Procedure: ENTEROSCOPY;  Surgeon: Inda Castle, MD;  Location: [facility];  Service: Endoscopy;  Laterality: N/A;  . Left heart catheterization with coronary angiogram N/A 04/23/2012    Procedure: LEFT HEART CATHETERIZATION WITH CORONARY ANGIOGRAM;  Surgeon: Larey Dresser, MD;  Location: [facility] CATH LAB;  Service: Cardiovascular;  Laterality: N/A;  ergies:   Allergies  Allergen Reactions  . Doxycycline Diarrhea    Possible diarrhea  . Metronidazole Other (See Comments)    Pt had difficulty swallowing this and says it was "horrible" to take. No allergic reaction.     Medications: I have reviewed patients current medications as documented in Epic Anti-infectives    Start     Dose/Rate Route Frequency Ordered Stop   05/10/14 1130  ciprofloxacin (CIPRO) IVPB 400 mg  Status:  Discontinued     400 mg 200 mL/hr over 60 Minutes Intravenous Every 12 hours 05/10/14 1121 05/14/14 0927   05/10/14 1130  metroNIDAZOLE (FLAGYL) IVPB 500 mg  Status:  Discontinued     500 mg 100 mL/hr over 60 Minutes Intravenous Every 8 hours 05/10/14 1121 05/14/14 8453      Social History:  reports that she has never smoked. She has never used smokeless tobacco. She reports that she does not drink alcohol or use illicit drugs.  Family History  Problem Relation Age of Onset  . Stomach cancer Mother   . Sarcoidosis Sister   . Colon cancer Brother   . Sarcoidosis Other   . CAD Neg Hx     As in HPI and primary teams notes otherwise 12 point review of systems is negative  Blood pressure 133/74, pulse 103, temperature 98.4 F (36.9 C), temperature source Oral, resp. rate 16, height '5\' 4"'  (1.626 m), weight 113 lb 14.4 oz (51.665 kg), SpO2 96 %. General: Alert and awake, oriented x3, not in any acute distress. HEENT: anicteric , EOMI, oropharynx clear and without exudate, a few filled cavities CVS regular rate, normal r,  no murmur rubs or gallops Chest: clear to auscultation bilaterally, no wheezing, rales or  rhonchi Abdomen: soft nontender, nondistended, normal bowel sounds, Extremities: no  clubbing or edema noted bilaterally Skin: no rashes Neuro: nonfocal, strength and sensation intact   Results for orders placed or performed during the hospital encounter of 05/08/14 (from the past 48 hour(s))  CBC     Status: Abnormal   Collection Time: 05/13/14  4:34 AM  Result Value Ref Range   WBC 6.6 4.0 - 10.5 K/uL   RBC 3.01 (L) 3.87 - 5.11 MIL/uL   Hemoglobin 9.4 (L) 12.0 - 15.0 g/dL   HCT 28.2 (L) 36.0 - 46.0 %   MCV 93.7 78.0 -  100.0 fL   MCH 31.2 26.0 - 34.0 pg   MCHC 33.3 30.0 - 36.0 g/dL   RDW 14.3 11.5 - 15.5 %   Platelets 255 150 - 400 K/uL  Comprehensive metabolic panel     Status: Abnormal   Collection Time: 05/13/14  4:34 AM  Result Value Ref Range   Sodium 136 135 - 145 mmol/L   Potassium 3.8 3.5 - 5.1 mmol/L   Chloride 100 96 - 112 mmol/L   CO2 27 19 - 32 mmol/L   Glucose, Bld 106 (H) 70 - 99 mg/dL   BUN 5 (L) 6 - 23 mg/dL   Creatinine, Ser 1.05 0.50 - 1.10 mg/dL   Calcium 8.3 (L) 8.4 - 10.5 mg/dL   Total Protein 6.3 6.0 - 8.3 g/dL   Albumin 2.5 (L) 3.5 - 5.2 g/dL   AST 55 (H) 0 - 37 U/L   ALT 38 (H) 0 - 35 U/L   Alkaline Phosphatase 65 39 - 117 U/L   Total Bilirubin 0.5 0.3 - 1.2 mg/dL   GFR calc non Af Amer 50 (L) >90 mL/min   GFR calc Af Amer 57 (L) >90 mL/min    Comment: (NOTE) The eGFR has been calculated using the CKD EPI equation. This calculation has not been validated in all clinical situations. eGFR's persistently <90 mL/min signify possible Chronic Kidney Disease.    Anion gap 9 5 - 15   '@BRIEFLABTABLE' (sdes,specrequest,cult,reptstatus)   ) Recent Results (from the past 720 hour(s))  Blood culture (routine x 2)     Status: None (Preliminary result)   Collection Time: 05/08/14  4:10 PM  Result Value Ref Range Status   Specimen Description BLOOD RIGHT HAND  Final   Special Requests BOTTLES DRAWN AEROBIC AND ANAEROBIC 2.5CC  Final   Culture   Final            BLOOD CULTURE RECEIVED NO GROWTH TO DATE CULTURE WILL BE HELD FOR 5 DAYS BEFORE ISSUING A FINAL NEGATIVE REPORT Performed at [facility]    Report Status PENDING  Incomplete  Urine culture     Status: None   Collection Time: 05/08/14  4:28 PM  Result Value Ref Range Status   Specimen Description URINE, CLEAN CATCH  Final   Special Requests NONE  Final   Colony Count NO GROWTH Performed at [facility]   Final   Culture NO GROWTH Performed at [facility]   Final   Report Status 05/09/2014 FINAL  Final  Blood culture (routine x 2)     Status: None (Preliminary result)   Collection Time: 05/08/14  5:04 PM  Result Value Ref Range Status   Specimen Description BLOOD RIGHT ARM  Final   Special Requests BOTTLES DRAWN AEROBIC AND ANAEROBIC 10CC  Final   Culture   Final           BLOOD CULTURE RECEIVED NO GROWTH TO DATE CULTURE WILL BE HELD FOR 5 DAYS BEFORE ISSUING A FINAL NEGATIVE REPORT Performed at [facility]    Report Status PENDING  Incomplete  Clostridium Difficile by PCR     Status: None   Collection Time: 05/09/14  6:02 AM  Result Value Ref Range Status   C difficile by pcr NEGATIVE NEGATIVE Final  Culture, blood (routine x 2)     Status: None (Preliminary result)   Collection Time: 05/10/14 10:24 AM  Result Value Ref Range Status   Specimen Description BLOOD RIGHT FOREARM  Final  Special Requests   Final    BOTTLES DRAWN AEROBIC AND ANAEROBIC 10CCBLUE 5CCRED   Culture   Final           BLOOD CULTURE RECEIVED NO GROWTH TO DATE CULTURE WILL BE HELD FOR 5 DAYS BEFORE ISSUING A FINAL NEGATIVE REPORT Performed at [facility]    Report Status PENDING  Incomplete  Culture, blood (routine x 2)     Status: None (Preliminary result)   Collection Time: 05/10/14 10:33 AM  Result Value Ref Range Status   Specimen Description BLOOD HAND RIGHT  Final   Special Requests   Final    BOTTLES DRAWN AEROBIC AND ANAEROBIC 10CCBLUE 5CCRED     Culture   Final           BLOOD CULTURE RECEIVED NO GROWTH TO DATE CULTURE WILL BE HELD FOR 5 DAYS BEFORE ISSUING A FINAL NEGATIVE REPORT Performed at [facility]    Report Status PENDING  Incomplete  Respiratory virus panel (routine influenza)     Status: None   Collection Time: 05/10/14 11:59 PM  Result Value Ref Range Status   Respiratory Syncytial Virus A Negative Negative Final   Respiratory Syncytial Virus B Negative Negative Final   Influenza A Negative Negative Final   Influenza B Negative Negative Final   Parainfluenza 1 Negative Negative Final   Parainfluenza 2 Negative Negative Final   Parainfluenza 3 Negative Negative Final   Metapneumovirus Negative Negative Final   Rhinovirus Negative Negative Final   Adenovirus Negative Negative Final    Comment: (NOTE) Performed At: [facility] [place], [place] [id number] Lindon Romp MD NA:[id number]      Impression/Recommendation  Principal Problem:   Fever of unknown origin Active Problems:   Iron deficiency anemia   Essential hypertension   Diarrhea   Atrial fibrillation    Coronary Artery Disease   AKI (acute kidney injury)   Occult blood positive stool   Hyponatremia   Gastroenteritis   Malnutrition of moderate degree   Abdominal pain, generalized   Dehydration   Lindsay Doyle is a 81 y.o. female with  FUO that has persisted despite aggressive workup with imaging and also persisted on antibiotics.  #1 Fever unknown origin:  I think the patient's request for workup as an outpatient is reasonable.  I feel she can be safely discharged to home and I can see her in the office or my partners can see her and work her up for fever on an urgent.  Among the tests that we would want to do would include an HIV fourth generation antibody hepatitis panel and Epstein-Barr virus serology panel CMV pouch of bleeding has been sent, rheumatoid factor and ANA and angiotensin-converting  enzyme, a sedimentation rate and C-reactive protein and rheumatoid factor and Anca, cryoglobulins and LDH  She also has fairly significant cervical lymphadenopathy and that could also be assessed with CT imaging of the neck if necessary.  Again we will carry out this workup as an outpatient.   05/14/2014, 2:13 PM   Thank you so much for this interesting consult  [place] [facility] [place] [phone] (pager) [phone] (office) 05/14/2014, 2:13 PM  Rhina Brack[place] 05/14/2014, 2:13 PM

## 2014-05-14 NOTE — Progress Notes (Signed)
Patient discharge teaching given, including activity, diet, follow-up appoints, and medications. Patient verbalized understanding of all discharge instructions. IV access was d/c'd. Vitals are stable. Skin is intact except as charted in most recent assessments. Pt to be escorted out by NT, to be driven home by family.  Tyshauna Finkbiner, MBA, BS, RN 

## 2014-05-15 LAB — CULTURE, BLOOD (ROUTINE X 2): CULTURE: NO GROWTH

## 2014-05-16 ENCOUNTER — Telehealth: Payer: Self-pay | Admitting: *Deleted

## 2014-05-16 LAB — CULTURE, BLOOD (ROUTINE X 2)
Culture: NO GROWTH
Culture: NO GROWTH

## 2014-05-16 LAB — HELICOBACTER PYLORI ABS-IGG+IGA, BLD: H PYLORI IGA: 11 U — AB (ref 0.0–8.9)

## 2014-05-16 NOTE — Telephone Encounter (Signed)
Earnstine Regal, MA at 05/16/2014 12:42 PM     Status: Signed       Expand All Collapse All   Transition Care Management Follow-up Telephone Call D/C 05/14/14  How have you been since you were released from the hospital? Pt states she is doing alright   Do you understand why you were in the hospital? YES   Do you understand the discharge instrcutions? YES  Items Reviewed:  Medications reviewed: YES  Allergies reviewed: YES  Dietary changes reviewed: YES, cardiac diet  Referrals reviewed: No referral needed   Functional Questionnaire:   Activities of Daily Living (ADLs):  She states he are independent in the following: ambulation, bathing and hygiene, feeding, continence, toileting and dressing She states she doesn't require assistance   Any transportation issues/concerns?: NO   Any patient concerns? NO   Confirmed importance and date/time of follow-up visits scheduled: YES, pt had already made appt for 05/19/14 with Dr. Jenny Reichmann, advise pt to keep appt   Confirmed with patient if condition begins to worsen call PCP or go to the ER.            This is a duplicate msg didn't know we had to forward msg to susan large...Johny Chess

## 2014-05-16 NOTE — Telephone Encounter (Signed)
Transition Care Management Follow-up Telephone Call D/C 05/14/14  How have you been since you were released from the hospital? Pt states she is doing alright   Do you understand why you were in the hospital? YES   Do you understand the discharge instrcutions? YES  Items Reviewed:  Medications reviewed: YES  Allergies reviewed: YES  Dietary changes reviewed: YES, cardiac diet  Referrals reviewed: No referral needed   Functional Questionnaire:   Activities of Daily Living (ADLs):   She states he are independent in the following: ambulation, bathing and hygiene, feeding, continence, toileting and dressing She states she doesn't require assistance   Any transportation issues/concerns?: NO   Any patient concerns? NO   Confirmed importance and date/time of follow-up visits scheduled: YES, pt had already made appt for 05/19/14 with Dr. Jenny Reichmann, advise pt to keep appt   Confirmed with patient if condition begins to worsen call PCP or go to the ER.

## 2014-05-17 ENCOUNTER — Telehealth: Payer: Self-pay | Admitting: Infectious Disease

## 2014-05-17 DIAGNOSIS — Z1272 Encounter for screening for malignant neoplasm of vagina: Secondary | ICD-10-CM | POA: Diagnosis not present

## 2014-05-17 DIAGNOSIS — Z6820 Body mass index (BMI) 20.0-20.9, adult: Secondary | ICD-10-CM | POA: Diagnosis not present

## 2014-05-17 DIAGNOSIS — Z90710 Acquired absence of both cervix and uterus: Secondary | ICD-10-CM | POA: Diagnosis not present

## 2014-05-17 DIAGNOSIS — Z124 Encounter for screening for malignant neoplasm of cervix: Secondary | ICD-10-CM | POA: Diagnosis not present

## 2014-05-17 DIAGNOSIS — Z1231 Encounter for screening mammogram for malignant neoplasm of breast: Secondary | ICD-10-CM | POA: Diagnosis not present

## 2014-05-17 NOTE — Telephone Encounter (Signed)
Patient with FUO who is coming to see me in March  Her IgA to H pylori is +  I think it is worthwhile giving her triple therapy  Minh should we give her a Prevpac or components separately  Is there any cost issue?

## 2014-05-18 LAB — HM MAMMOGRAPHY

## 2014-05-19 ENCOUNTER — Encounter: Payer: Self-pay | Admitting: Internal Medicine

## 2014-05-19 ENCOUNTER — Ambulatory Visit (INDEPENDENT_AMBULATORY_CARE_PROVIDER_SITE_OTHER): Payer: Medicare Other | Admitting: Internal Medicine

## 2014-05-19 VITALS — BP 110/68 | HR 100 | Temp 97.8°F | Wt 110.0 lb

## 2014-05-19 DIAGNOSIS — R760 Raised antibody titer: Secondary | ICD-10-CM

## 2014-05-19 DIAGNOSIS — J019 Acute sinusitis, unspecified: Secondary | ICD-10-CM | POA: Insufficient documentation

## 2014-05-19 DIAGNOSIS — R768 Other specified abnormal immunological findings in serum: Secondary | ICD-10-CM | POA: Insufficient documentation

## 2014-05-19 DIAGNOSIS — I10 Essential (primary) hypertension: Secondary | ICD-10-CM | POA: Diagnosis not present

## 2014-05-19 MED ORDER — CLARITHROMYCIN 500 MG PO TABS: 500.0000 mg | ORAL_TABLET | Freq: Two times a day (BID) | ORAL | Status: DC

## 2014-05-19 MED ORDER — AMOXICILLIN 500 MG PO CAPS: 1000.0000 mg | ORAL_CAPSULE | Freq: Two times a day (BID) | ORAL | Status: DC

## 2014-05-19 NOTE — Progress Notes (Signed)
Pre visit review using our clinic review tool, if applicable. No additional management support is needed unless otherwise documented below in the visit note. 

## 2014-05-19 NOTE — Assessment & Plan Note (Signed)
ID rec'd tx , pt to take her protonix 40 bid, and amoxil/biaxin for 14 days course , d/w pt since she is here, pt quite angrry today, difficult to convince to take her meds

## 2014-05-19 NOTE — Assessment & Plan Note (Signed)
Mild to mod, for antibx course,  to f/u any worsening symptoms or concerns 

## 2014-05-19 NOTE — Progress Notes (Signed)
Subjective:    Patient ID: Lindsay Doyle, female    DOB: 1934/01/24, 81 y.o.   MRN: [id number]  HPI  Here with 2-3 days acute onset fever, facial pain, pressure, headache, general weakness and malaise, and greenish d/c, with mild ST and cough, but pt denies chest pain, wheezing, increased sob or doe, orthopnea, PND, increased LE swelling, palpitations, dizziness or syncope. Incidentally with dx of IgA + h pylori, rec'd for prevpac per ID, pt not yet started,  Does have protonix at home, 30 day supply, only took 1 pill then quit. Quite angry today over the volume of recent tests and lab draws that left her with bruises. Denies worsening depressive symptoms, suicidal ideation, or panic;  Past Medical History  Diagnosis Date  . Hyperlipidemia   . Mitral valve prolapse     Remotely - last echo 2011 did not show this  . ANEMIA-IRON DEFICIENCY     presumed GI bleed 08/2012- anticoag stopped  . LEUKOPENIA, MILD 05/30/2008  . Hypertension 11/12/2006  . COPD 03/09/2007    ? pt is unsure  . OSTEOARTHRITIS 02/19/2009    Spinal  . OSTEOPOROSIS 11/12/2006  . COLONIC POLYPS, HX OF 03/09/2007  . HYPERGLYCEMIA 07/02/2007  . PSVT     a. Remotely, in setting of anemia.  [name] Positive H. pylori test 10/1996  . Cataract   . Blood transfusion     a. 05/2010: 3 transfusions for anemia/GIB.  [name] GI bleed     a. 05/2010: Hgb 7 at outside hospital s/p 3 u PRBC, EGD 05/2010 mild gastritis, small hiatal hernia, Schatzki ring, negative colonoscopy; capsule endoscopy 06/2010 - possible nonspecific inflammatory changes.;  b.  08/2012: 1/2 guiac +; required transfusion - Apixaban d/c'd  . Hx of echocardiogram     a. echo 2/11:  EF 55-60%, mild AI, mild RAE, mild to mod TR;   b.  a. Echo 2/14:  Mild focal basal and mild LVH, EF 60-65%, mild AI, mild RAE, trivia effusion  . NSTEMI (non-ST elevated myocardial infarction) 03/2012    a. Type II in setting of AF with RVR 03/2012  . Atrial fibrillation     a. Apixaban started 03/2012 -  stopped after admx for GI bleed 6/14  . CAD (coronary artery disease)     a. LHC 1/14:  mLAD serial 30 and 60%, oRCA 30%, EF 60%  . Spinal stenosis of lumbar region 07/16/2012  . Asthma    Past Surgical History  Procedure Laterality Date  . Abdominal hysterectomy    . Tonsillectomy    . Tubal ligation    . Cataract extraction    . Cardiac catheterization  1992    S/P in 1992 at [facility] in [place]. This was negative for any coronary artery disease  . Carotid duplex  08/29/2003  . Ceasarean    . Enteroscopy N/A 10/10/2013    Procedure: ENTEROSCOPY;  Surgeon: Inda Castle, MD;  Location: [facility];  Service: Endoscopy;  Laterality: N/A;  . Left heart catheterization with coronary angiogram N/A 04/23/2012    Procedure: LEFT HEART CATHETERIZATION WITH CORONARY ANGIOGRAM;  Surgeon: Larey Dresser, MD;  Location: [facility] CATH LAB;  Service: Cardiovascular;  Laterality: N/A;    reports that she has never smoked. She has never used smokeless tobacco. She reports that she does not drink alcohol or use illicit drugs. family history includes Colon cancer in her brother; Sarcoidosis in her other and sister; Stomach cancer in her  mother. There is no history of CAD. Allergies  Allergen Reactions  . Doxycycline Diarrhea    Possible diarrhea  . Metronidazole Other (See Comments)    Pt had difficulty swallowing this and says it was "horrible" to take. No allergic reaction.   Current Outpatient Prescriptions on File Prior to Visit  Medication Sig Dispense Refill  . aspirin EC 81 MG tablet Take 1 tablet (81 mg total) by mouth daily.    [name] atorvastatin (LIPITOR) 20 MG tablet Take 1 tablet (20 mg total) by mouth daily. (Patient taking differently: Take 20 mg by mouth daily at 6 PM. ) 90 tablet 1  . b complex vitamins tablet Take 1 tablet by mouth 2 (two) times a week.     . Calcium Carbonate-Vitamin D (CALTRATE 600+D) 600-400 MG-UNIT per tablet Take 1 tablet by mouth daily.        . ferrous sulfate 325 (65 FE) MG tablet Take 325 mg by mouth every evening.     . metoprolol succinate (TOPROL-XL) 50 MG 24 hr tablet Take 50 mg by mouth every morning. Take with or immediately following a meal.    . Multiple Vitamin (MULTIVITAMIN) tablet Take 1 tablet by mouth daily.      . pantoprazole (PROTONIX) 40 MG tablet Take 1 tablet (40 mg total) by mouth daily. 30 tablet 0   No current facility-administered medications on file prior to visit.   Review of Systems  Constitutional: Negative for unusual diaphoresis or other sweats  HENT: Negative for ringing in ear Eyes: Negative for double vision or worsening visual disturbance.  Respiratory: Negative for choking and stridor.   Gastrointestinal: Negative for vomiting or other signifcant bowel change Genitourinary: Negative for hematuria or decreased urine volume.  Musculoskeletal: Negative for other MSK pain or swelling Skin: Negative for color change and worsening wound.  Neurological: Negative for tremors and numbness other than noted  Psychiatric/Behavioral: Negative for decreased concentration or agitation other than above       Objective:   Physical Exam BP 110/68 mmHg  Pulse 100  Temp(Src) 97.8 F (36.6 C) (Oral)  Wt 110 lb (49.896 kg) VS noted,  Constitutional: Pt appears well-developed, well-nourished.  HENT: Head: NCAT.  Right Ear: External ear normal.  Left Ear: External ear normal.  Bilat tm's with mild erythema.  Max sinus areas mild tender.  Pharynx with mild erythema, no exudate Eyes: . Pupils are equal, round, and reactive to light. Conjunctivae and EOM are normal Neck: Normal range of motion. Neck supple.  Cardiovascular: Normal rate and regular rhythm.   Pulmonary/Chest: Effort normal and breath sounds without rales or wheezing.  Abd:  Soft, NT, ND, + BS Neurological: Pt is alert. Not confused , motor grossly intact Skin: Skin is warm. No rash Psychiatric: Pt behavior is normal. No agitation. angry  demeanor today, somewhat uncooperative    Assessment & Plan:

## 2014-05-19 NOTE — Patient Instructions (Signed)
OK to take the protonix that you have at 1 pill twice per day for 14 days  Please take all new medication as prescribed - the two antibiotics for 14 days with the protonix  You can also take Delsym OTC for cough, and/or Mucinex (or it's generic off brand) for congestion, and tylenol as needed for pain.  Please continue all other medications as before, and refills have been done if requested.  Please have the pharmacy call with any other refills you may need.  Please keep your appointments with your specialists as you may have planned

## 2014-05-19 NOTE — Assessment & Plan Note (Signed)
stable overall by history and exam, recent data reviewed with pt, and pt to continue medical treatment as before,  to f/u any worsening symptoms or concerns / BP Readings from Last 3 Encounters:  05/19/14 110/68  05/14/14 133/74  05/08/14 120/74

## 2014-05-20 LAB — CULTURE, BLOOD (ROUTINE X 2)
CULTURE: NO GROWTH
Culture: NO GROWTH

## 2014-05-31 ENCOUNTER — Telehealth: Payer: Self-pay | Admitting: *Deleted

## 2014-05-31 NOTE — Telephone Encounter (Signed)
[facility] [place] [facility] Patient Name: Lindsay Doyle Gender: Female DOB: 02-03-1934 Age: 81 Y 10 M 23 D Return Phone Number: [phone] (Primary) Address: City/State/Zip: [place] Client [facility] [facility] - Client Client Site [place] - Day Physician Cathlean Cower Contact Type Call Call Type Triage / Clinical Relationship To Patient Self Appointment Disposition EMR Appointment Not Necessary Return Phone Number [phone] (Primary) Chief Complaint Mouth Symptoms Initial Comment Caller states her gums hurt. Wants to know what to do. PreDisposition Home Care Info pasted into Epic No Nurse Assessment Nurse: Dimas Chyle, RN, Levada Dy Date/Time Eilene Ghazi Time): 05/30/2014 5:22:39 PM Confirm and document reason for call. If symptomatic, describe symptoms. ---Caller states her gums hurt. Wants to know what to do. Has the patient traveled out of the country within the last 30 days? ---Not Applicable Does the patient require triage? ---Yes Related visit to physician within the last 2 weeks? ---No Does the PT have any chronic conditions? (i.e. diabetes, asthma, etc.) ---Unknown Guidelines Guideline Title Affirmed Question Affirmed Notes Nurse Date/Time Eilene Ghazi Time) Mouth Symptoms Dentures rub and cause pain Gaylyn Cheers 05/30/2014 5:23:24 PM Disp. Time Eilene Ghazi Time) Disposition Final User 05/30/2014 5:24:24 PM Call Dentist when Office is Open Yes Dimas Chyle, RN, Marin Shutter Understands: Yes Disagree/Comply: Comply Care Advice Given Per Guideline

## 2014-06-15 DIAGNOSIS — J342 Deviated nasal septum: Secondary | ICD-10-CM | POA: Diagnosis not present

## 2014-06-15 DIAGNOSIS — J329 Chronic sinusitis, unspecified: Secondary | ICD-10-CM | POA: Diagnosis not present

## 2014-06-16 ENCOUNTER — Ambulatory Visit: Payer: Medicare Other | Admitting: Internal Medicine

## 2014-06-19 DIAGNOSIS — J329 Chronic sinusitis, unspecified: Secondary | ICD-10-CM | POA: Diagnosis not present

## 2014-07-06 ENCOUNTER — Other Ambulatory Visit: Payer: Self-pay | Admitting: Internal Medicine

## 2014-07-06 ENCOUNTER — Other Ambulatory Visit: Payer: Self-pay | Admitting: Physician Assistant

## 2014-07-13 ENCOUNTER — Telehealth: Payer: Self-pay | Admitting: Internal Medicine

## 2014-07-13 MED ORDER — METOPROLOL SUCCINATE ER 50 MG PO TB24: ORAL_TABLET | ORAL | Status: DC

## 2014-07-13 MED ORDER — ATORVASTATIN CALCIUM 20 MG PO TABS: ORAL_TABLET | ORAL | Status: DC

## 2014-07-13 NOTE — Telephone Encounter (Signed)
Patient needs refill for   atorvastatin (LIPITOR) 20 MG tablet [id number]     And metoprolol succinate (TOPROL-XL) 50 MG 24 hr tablet [id number]  She says she needs new prescription sent to [facility]. [facility] [address]

## 2014-07-13 NOTE — Telephone Encounter (Signed)
[facility].../lmb

## 2014-07-27 ENCOUNTER — Encounter: Payer: Self-pay | Admitting: Internal Medicine

## 2014-07-27 ENCOUNTER — Other Ambulatory Visit (INDEPENDENT_AMBULATORY_CARE_PROVIDER_SITE_OTHER): Payer: Medicare Other

## 2014-07-27 ENCOUNTER — Ambulatory Visit (INDEPENDENT_AMBULATORY_CARE_PROVIDER_SITE_OTHER): Payer: Medicare Other | Admitting: Internal Medicine

## 2014-07-27 VITALS — BP 118/78 | HR 87 | Temp 97.6°F | Resp 16 | Ht 64.0 in | Wt 108.1 lb

## 2014-07-27 DIAGNOSIS — I10 Essential (primary) hypertension: Secondary | ICD-10-CM

## 2014-07-27 DIAGNOSIS — E785 Hyperlipidemia, unspecified: Secondary | ICD-10-CM

## 2014-07-27 DIAGNOSIS — D509 Iron deficiency anemia, unspecified: Secondary | ICD-10-CM | POA: Diagnosis not present

## 2014-07-27 DIAGNOSIS — J309 Allergic rhinitis, unspecified: Secondary | ICD-10-CM

## 2014-07-27 DIAGNOSIS — R7302 Impaired glucose tolerance (oral): Secondary | ICD-10-CM | POA: Diagnosis not present

## 2014-07-27 LAB — URINALYSIS, ROUTINE W REFLEX MICROSCOPIC
Bilirubin Urine: NEGATIVE
Ketones, ur: NEGATIVE
Leukocytes, UA: NEGATIVE
NITRITE: NEGATIVE
Specific Gravity, Urine: <=1.005 — AB (ref 1.000–1.030)
Total Protein, Urine: NEGATIVE
UROBILINOGEN UA: 0.2 (ref 0.0–1.0)
Urine Glucose: NEGATIVE
WBC UA: NONE SEEN (ref 0–?)
pH: 7 (ref 5.0–8.0)

## 2014-07-27 LAB — CBC WITH DIFFERENTIAL/PLATELET
Basophils Absolute: 0 10*3/uL (ref 0.0–0.1)
Basophils Relative: 1 % (ref 0.0–3.0)
EOS ABS: 0.4 10*3/uL (ref 0.0–0.7)
Eosinophils Relative: 8.4 % — ABNORMAL HIGH (ref 0.0–5.0)
HCT: 37.9 % (ref 36.0–46.0)
Hemoglobin: 12.7 g/dL (ref 12.0–15.0)
LYMPHS PCT: 41.6 % (ref 12.0–46.0)
Lymphs Abs: 1.9 10*3/uL (ref 0.7–4.0)
MCHC: 33.4 g/dL (ref 30.0–36.0)
MCV: 93.3 fl (ref 78.0–100.0)
Monocytes Absolute: 0.5 10*3/uL (ref 0.1–1.0)
Monocytes Relative: 11.7 % (ref 3.0–12.0)
NEUTROS ABS: 1.7 10*3/uL (ref 1.4–7.7)
Neutrophils Relative %: 37.3 % — ABNORMAL LOW (ref 43.0–77.0)
Platelets: 244 10*3/uL (ref 150.0–400.0)
RBC: 4.06 Mil/uL (ref 3.87–5.11)
RDW: 16.5 % — AB (ref 11.5–15.5)
WBC: 4.7 10*3/uL (ref 4.0–10.5)

## 2014-07-27 LAB — BASIC METABOLIC PANEL
BUN: 15 mg/dL (ref 6–23)
CO2: 28 mEq/L (ref 19–32)
Calcium: 10.2 mg/dL (ref 8.4–10.5)
Chloride: 99 mEq/L (ref 96–112)
Creatinine, Ser: 1.05 mg/dL (ref 0.40–1.20)
GFR: 65.01 mL/min (ref >60.00)
Glucose, Bld: 92 mg/dL (ref 70–99)
Potassium: 4.7 mEq/L (ref 3.5–5.1)
SODIUM: 135 meq/L (ref 135–145)

## 2014-07-27 LAB — LIPID PANEL
CHOL/HDL RATIO: 2
Cholesterol: 183 mg/dL (ref 0–200)
HDL: 74 mg/dL (ref >39.00)
LDL CALC: 92 mg/dL (ref 0–99)
NONHDL: 109
Triglycerides: 85 mg/dL (ref 0.0–149.0)
VLDL: 17 mg/dL (ref 0.0–40.0)

## 2014-07-27 LAB — IBC PANEL
IRON: 68 ug/dL (ref 42–145)
Saturation Ratios: 17.3 % — ABNORMAL LOW (ref 20.0–50.0)
Transferrin: 280 mg/dL (ref 212.0–360.0)

## 2014-07-27 LAB — HEPATIC FUNCTION PANEL
ALT: 15 U/L (ref 0–35)
AST: 20 U/L (ref 0–37)
Albumin: 4.4 g/dL (ref 3.5–5.2)
Alkaline Phosphatase: 71 U/L (ref 39–117)
BILIRUBIN DIRECT: 0.1 mg/dL (ref 0.0–0.3)
BILIRUBIN TOTAL: 0.4 mg/dL (ref 0.2–1.2)
Total Protein: 7.9 g/dL (ref 6.0–8.3)

## 2014-07-27 LAB — FERRITIN: FERRITIN: 72.1 ng/mL (ref 10.0–291.0)

## 2014-07-27 LAB — HEMOGLOBIN A1C: Hgb A1c MFr Bld: 5.7 % (ref 4.6–6.5)

## 2014-07-27 LAB — TSH: TSH: 1.99 u[IU]/mL (ref 0.35–4.50)

## 2014-07-27 MED ORDER — FLUTICASONE PROPIONATE 0.05 % EX LOTN: TOPICAL_LOTION | CUTANEOUS | Status: DC

## 2014-07-27 MED ORDER — TACROLIMUS 0.1 % EX OINT: TOPICAL_OINTMENT | Freq: Two times a day (BID) | CUTANEOUS | Status: DC

## 2014-07-27 NOTE — Progress Notes (Signed)
Subjective:    Patient ID: Lindsay Doyle, female    DOB: 1933-04-26, 81 y.o.   MRN: [id number]  HPI  Here to f/u, Here to f/u; overall doing ok,  Pt denies chest pain, increasing sob or doe, wheezing, orthopnea, PND, increased LE swelling, palpitations, dizziness or syncope.  Pt denies new neurological symptoms such as new headache, or facial or extremity weakness or numbness.  Pt denies polydipsia, polyuria, or low sugar episode.   Pt denies new neurological symptoms such as new headache, or facial or extremity weakness or numbness.   Pt states overall good compliance with meds, mostly trying to follow appropriate diet, with wt overall stable,  but little exercise however.  S/p tx h pylori and states she feels better now than felt in many years. Plans to go back to regular exercise soon.  Denies worsening depressive symptoms, suicidal ideation, or panic.  Did not see ID as improved it seems with h pylori tx.  Has seen ENT, s/p CY sinus, tx with flonase and improved that way as well. Still has some nasal congestion around 10 pm at night. Only taking iron once per day. Taking asa 81 mg only, as she refuses further eliquis after hx of GI bleed.  No overt bleeding. Last hgb 9.4 in feb 2016.  Past Medical History  Diagnosis Date  . Hyperlipidemia   . Mitral valve prolapse     Remotely - last echo 2011 did not show this  . ANEMIA-IRON DEFICIENCY     presumed GI bleed 08/2012- anticoag stopped  . LEUKOPENIA, MILD 05/30/2008  . Hypertension 11/12/2006  . COPD 03/09/2007    ? pt is unsure  . OSTEOARTHRITIS 02/19/2009    Spinal  . OSTEOPOROSIS 11/12/2006  . COLONIC POLYPS, HX OF 03/09/2007  . HYPERGLYCEMIA 07/02/2007  . PSVT     a. Remotely, in setting of anemia.  [name] Positive H. pylori test 10/1996  . Cataract   . Blood transfusion     a. 05/2010: 3 transfusions for anemia/GIB.  [name] GI bleed     a. 05/2010: Hgb 7 at outside hospital s/p 3 u PRBC, EGD 05/2010 mild gastritis, small hiatal hernia, Schatzki  ring, negative colonoscopy; capsule endoscopy 06/2010 - possible nonspecific inflammatory changes.;  b.  08/2012: 1/2 guiac +; required transfusion - Apixaban d/c'd  . Hx of echocardiogram     a. echo 2/11:  EF 55-60%, mild AI, mild RAE, mild to mod TR;   b.  a. Echo 2/14:  Mild focal basal and mild LVH, EF 60-65%, mild AI, mild RAE, trivia effusion  . NSTEMI (non-ST elevated myocardial infarction) 03/2012    a. Type II in setting of AF with RVR 03/2012  . Atrial fibrillation     a. Apixaban started 03/2012 - stopped after admx for GI bleed 6/14  . CAD (coronary artery disease)     a. LHC 1/14:  mLAD serial 30 and 60%, oRCA 30%, EF 60%  . Spinal stenosis of lumbar region 07/16/2012  . Asthma    Past Surgical History  Procedure Laterality Date  . Abdominal hysterectomy    . Tonsillectomy    . Tubal ligation    . Cataract extraction    . Cardiac catheterization  1992    S/P in 1992 at [facility] in [place]. This was negative for any coronary artery disease  . Carotid duplex  08/29/2003  . Ceasarean    . Enteroscopy N/A 10/10/2013  Procedure: ENTEROSCOPY;  Surgeon: Inda Castle, MD;  Location: [facility] ENDOSCOPY;  Service: Endoscopy;  Laterality: N/A;  . Left heart catheterization with coronary angiogram N/A 04/23/2012    Procedure: LEFT HEART CATHETERIZATION WITH CORONARY ANGIOGRAM;  Surgeon: Larey Dresser, MD;  Location: [facility] CATH LAB;  Service: Cardiovascular;  Laterality: N/A;    reports that she has never smoked. She has never used smokeless tobacco. She reports that she does not drink alcohol or use illicit drugs. family history includes Colon cancer in her brother; Sarcoidosis in her other and sister; Stomach cancer in her mother. There is no history of CAD. Allergies  Allergen Reactions  . Doxycycline Diarrhea    Possible diarrhea  . Biaxin [Clarithromycin] Other (See Comments)    diarrhea  . Metronidazole Other (See Comments)    Pt had difficulty swallowing  this and says it was "horrible" to take. No allergic reaction.   Current Outpatient Prescriptions on File Prior to Visit  Medication Sig Dispense Refill  . aspirin EC 81 MG tablet Take 1 tablet (81 mg total) by mouth daily.    [name] atorvastatin (LIPITOR) 20 MG tablet TAKE 1 TABLET (20 MG TOTAL) BY MOUTH DAILY. 90 tablet 1  . b complex vitamins tablet Take 1 tablet by mouth 2 (two) times a week.     . Calcium Carbonate-Vitamin D (CALTRATE 600+D) 600-400 MG-UNIT per tablet Take 1 tablet by mouth daily.      . ferrous sulfate 325 (65 FE) MG tablet Take 325 mg by mouth every evening.     . metoprolol succinate (TOPROL-XL) 50 MG 24 hr tablet TAKE 1 TABLET (50 MG TOTAL) BY MOUTH DAILY. 90 tablet 1  . Multiple Vitamin (MULTIVITAMIN) tablet Take 1 tablet by mouth daily.       No current facility-administered medications on file prior to visit.    Review of Systems  Constitutional: Negative for unusual diaphoresis or night sweats HENT: Negative for ringing in ear or discharge Eyes: Negative for double vision or worsening visual disturbance.  Respiratory: Negative for choking and stridor.   Gastrointestinal: Negative for vomiting or other signifcant bowel change Genitourinary: Negative for hematuria or change in urine volume.  Musculoskeletal: Negative for other MSK pain or swelling Skin: Negative for color change and worsening wound.  Neurological: Negative for tremors and numbness other than noted  Psychiatric/Behavioral: Negative for decreased concentration or agitation other than above  '    Objective:   Physical Exam BP 118/78 mmHg  Pulse 87  Temp(Src) 97.6 F (36.4 C) (Oral)  Resp 16  Ht 5\' 4"  (1.626 m)  Wt 108 lb 1.3 oz (49.025 kg)  BMI 18.54 kg/m2  SpO2 95% VS noted,  Constitutional: Pt appears in no significant distress HENT: Head: NCAT.  Right Ear: External ear normal.  Left Ear: External ear normal.  Eyes: . Pupils are equal, round, and reactive to light. Conjunctivae and EOM  are normal Neck: Normal range of motion. Neck supple.  Cardiovascular: Normal rate and regular rhythm.   Pulmonary/Chest: Effort normal and breath sounds without rales or wheezing.  Abd:  Soft, NT, ND, + BS Neurological: Pt is alert. Not confused , motor grossly intact Skin: Skin is warm. No rash, no LE edema Psychiatric: Pt behavior is normal. No agitation.     Assessment & Plan:

## 2014-07-27 NOTE — Patient Instructions (Signed)

## 2014-07-27 NOTE — Progress Notes (Signed)
Pre visit review using our clinic review tool, if applicable. No additional management support is needed unless otherwise documented below in the visit note. 

## 2014-07-27 NOTE — Assessment & Plan Note (Signed)
stable overall by history and exam, recent data reviewed with pt, and pt to continue medical treatment as before,  to f/u any worsening symptoms or concerns Lab Results  Component Value Date   WBC 6.6 05/13/2014   HGB 9.4* 05/13/2014   HCT 28.2* 05/13/2014   MCV 93.7 05/13/2014   PLT 255 05/13/2014   For f/u lab, iron panel, ferritin

## 2014-07-27 NOTE — Assessment & Plan Note (Signed)
stable overall by history and exam, recent data reviewed with pt, and pt to continue medical treatment as before,  to f/u any worsening symptoms or concerns Lab Results  Component Value Date   HGBA1C 5.7 07/26/2013   For f/u lab

## 2014-07-27 NOTE — Assessment & Plan Note (Signed)
stable overall by history and exam, recent data reviewed with pt, and pt to continue medical treatment as before,  to f/u any worsening symptoms or concerns BP Readings from Last 3 Encounters:  07/27/14 118/78  05/19/14 110/68  05/14/14 133/74

## 2014-07-27 NOTE — Assessment & Plan Note (Signed)
stable overall by history and exam, recent data reviewed with pt, and pt to continue medical treatment as before,  to f/u any worsening symptoms or concerns Lab Results  Component Value Date   [place] 95 07/26/2013

## 2014-07-27 NOTE — Assessment & Plan Note (Signed)
Ok to add zyrtec otc 10 mg qhs prn,  to f/u any worsening symptoms or concerns

## 2014-07-31 ENCOUNTER — Other Ambulatory Visit: Payer: Self-pay | Admitting: Internal Medicine

## 2014-07-31 DIAGNOSIS — N179 Acute kidney failure, unspecified: Secondary | ICD-10-CM | POA: Diagnosis not present

## 2014-08-16 DIAGNOSIS — N179 Acute kidney failure, unspecified: Secondary | ICD-10-CM | POA: Diagnosis not present

## 2014-08-16 DIAGNOSIS — I10 Essential (primary) hypertension: Secondary | ICD-10-CM | POA: Diagnosis not present

## 2014-09-18 ENCOUNTER — Ambulatory Visit: Payer: Medicare Other | Admitting: Cardiology

## 2014-11-14 ENCOUNTER — Emergency Department (HOSPITAL_COMMUNITY)
Admission: EM | Admit: 2014-11-14 | Discharge: 2014-11-14 | Disposition: A | Discharge status: Home / Self Care | Payer: Medicare Other | Attending: Emergency Medicine | Admitting: Emergency Medicine

## 2014-11-14 ENCOUNTER — Encounter (HOSPITAL_COMMUNITY): Payer: Self-pay | Admitting: Emergency Medicine

## 2014-11-14 DIAGNOSIS — Z7982 Long term (current) use of aspirin: Secondary | ICD-10-CM | POA: Diagnosis not present

## 2014-11-14 DIAGNOSIS — I252 Old myocardial infarction: Secondary | ICD-10-CM | POA: Diagnosis not present

## 2014-11-14 DIAGNOSIS — I10 Essential (primary) hypertension: Secondary | ICD-10-CM | POA: Diagnosis not present

## 2014-11-14 DIAGNOSIS — Z9889 Other specified postprocedural states: Secondary | ICD-10-CM | POA: Diagnosis not present

## 2014-11-14 DIAGNOSIS — E785 Hyperlipidemia, unspecified: Secondary | ICD-10-CM | POA: Diagnosis not present

## 2014-11-14 DIAGNOSIS — M6281 Muscle weakness (generalized): Secondary | ICD-10-CM | POA: Diagnosis present

## 2014-11-14 DIAGNOSIS — I2510 Atherosclerotic heart disease of native coronary artery without angina pectoris: Secondary | ICD-10-CM | POA: Diagnosis not present

## 2014-11-14 DIAGNOSIS — R5383 Other fatigue: Secondary | ICD-10-CM | POA: Insufficient documentation

## 2014-11-14 DIAGNOSIS — I4891 Unspecified atrial fibrillation: Secondary | ICD-10-CM | POA: Insufficient documentation

## 2014-11-14 DIAGNOSIS — Z86010 Personal history of colonic polyps: Secondary | ICD-10-CM | POA: Insufficient documentation

## 2014-11-14 DIAGNOSIS — Z79899 Other long term (current) drug therapy: Secondary | ICD-10-CM | POA: Diagnosis not present

## 2014-11-14 DIAGNOSIS — J449 Chronic obstructive pulmonary disease, unspecified: Secondary | ICD-10-CM | POA: Diagnosis not present

## 2014-11-14 DIAGNOSIS — D509 Iron deficiency anemia, unspecified: Secondary | ICD-10-CM | POA: Insufficient documentation

## 2014-11-14 DIAGNOSIS — M1990 Unspecified osteoarthritis, unspecified site: Secondary | ICD-10-CM | POA: Insufficient documentation

## 2014-11-14 LAB — BASIC METABOLIC PANEL
ANION GAP: 13 (ref 5–15)
BUN: 14 mg/dL (ref 6–20)
CO2: 21 mmol/L — AB (ref 22–32)
Calcium: 8.8 mg/dL — ABNORMAL LOW (ref 8.9–10.3)
Chloride: 95 mmol/L — ABNORMAL LOW (ref 101–111)
Creatinine, Ser: 0.97 mg/dL (ref 0.44–1.00)
GFR calc Af Amer: >60 mL/min (ref >60)
GFR calc non Af Amer: 54 mL/min — ABNORMAL LOW (ref >60)
GLUCOSE: 116 mg/dL — AB (ref 65–99)
POTASSIUM: 4.5 mmol/L (ref 3.5–5.1)
Sodium: 129 mmol/L — ABNORMAL LOW (ref 135–145)

## 2014-11-14 LAB — PROTIME-INR
INR: 1.09 (ref 0.00–1.49)
Prothrombin Time: 14.3 seconds (ref 11.6–15.2)

## 2014-11-14 LAB — CBC WITH DIFFERENTIAL/PLATELET
Basophils Absolute: 0 10*3/uL (ref 0.0–0.1)
Basophils Relative: 0 % (ref 0–1)
Eosinophils Absolute: 0.1 10*3/uL (ref 0.0–0.7)
Eosinophils Relative: 1 % (ref 0–5)
HEMATOCRIT: 21.3 % — AB (ref 36.0–46.0)
Hemoglobin: 7.4 g/dL — ABNORMAL LOW (ref 12.0–15.0)
LYMPHS PCT: 30 % (ref 12–46)
Lymphs Abs: 1.8 10*3/uL (ref 0.7–4.0)
MCH: 32.5 pg (ref 26.0–34.0)
MCHC: 34.7 g/dL (ref 30.0–36.0)
MCV: 93.4 fL (ref 78.0–100.0)
MONO ABS: 0.6 10*3/uL (ref 0.1–1.0)
MONOS PCT: 10 % (ref 3–12)
NEUTROS ABS: 3.7 10*3/uL (ref 1.7–7.7)
Neutrophils Relative %: 59 % (ref 43–77)
Platelets: 428 10*3/uL — ABNORMAL HIGH (ref 150–400)
RBC: 2.28 MIL/uL — ABNORMAL LOW (ref 3.87–5.11)
RDW: 14 % (ref 11.5–15.5)
WBC: 6.2 10*3/uL (ref 4.0–10.5)

## 2014-11-14 LAB — I-STAT TROPONIN, ED: Troponin i, poc: 0.09 ng/mL (ref 0.00–0.08)

## 2014-11-14 MED ORDER — METOPROLOL TARTRATE 1 MG/ML IV SOLN: 2.5000 mg | Freq: Once | INTRAVENOUS | Status: DC

## 2014-11-14 MED ORDER — LABETALOL HCL 5 MG/ML IV SOLN
2.5000 mg | Freq: Once | INTRAVENOUS | Status: DC
  Filled 2014-11-14: qty 4

## 2014-11-14 MED ORDER — METOPROLOL TARTRATE 1 MG/ML IV SOLN
2.5000 mg | INTRAVENOUS | Status: DC | PRN
  Administered 2014-11-14: 2.5 mg via INTRAVENOUS
  Filled 2014-11-14: qty 5

## 2014-11-14 MED ORDER — DILTIAZEM HCL 25 MG/5ML IV SOLN
12.5000 mg | Freq: Once | INTRAVENOUS | Status: AC
  Administered 2014-11-14: 12.5 mg via INTRAVENOUS
  Filled 2014-11-14: qty 5

## 2014-11-14 MED ORDER — METOPROLOL SUCCINATE ER 25 MG PO TB24
50.0000 mg | ORAL_TABLET | Freq: Every day | ORAL | Status: DC
  Administered 2014-11-14: 50 mg via ORAL
  Filled 2014-11-14: qty 2

## 2014-11-14 MED ORDER — SODIUM CHLORIDE 0.9 % IV BOLUS (SEPSIS): 500.0000 mL | Freq: Once | INTRAVENOUS | Status: DC

## 2014-11-14 MED ORDER — SODIUM CHLORIDE 0.9 % IV BOLUS (SEPSIS)
1000.0000 mL | Freq: Once | INTRAVENOUS | Status: AC
Start: 2014-11-14 — End: 2014-11-14
  Administered 2014-11-14: 1000 mL via INTRAVENOUS

## 2014-11-14 NOTE — ED Notes (Signed)
IV team at bedside 

## 2014-11-14 NOTE — Discharge Instructions (Signed)
Atrial Fibrillation  Atrial fibrillation is a condition that causes your heart to beat irregularly. It may also cause your heart to beat faster than normal. Atrial fibrillation can prevent your heart from pumping blood normally. It increases your risk of stroke and heart problems.  HOME CARE  · Take medications as told by your doctor.  · Only take medications that your doctor says are safe. Some medications can make the condition worse or happen again.  · If blood thinners were prescribed by your doctor, take them exactly as told. Too much can cause bleeding. Too little and you will not have the needed protection against stroke and other problems.  · Perform blood tests at home if told by your doctor.  · Perform blood tests exactly as told by your doctor.  · Do not drink alcohol.  · Do not drink beverages with caffeine such as coffee, soda, and some teas.  · Maintain a healthy weight.  · Do not use diet pills unless your doctor says they are safe. They may make heart problems worse.  · Follow diet instructions as told by your doctor.  · Exercise regularly as told by your doctor.  · Keep all follow-up appointments.  GET HELP IF:  · You notice a change in the speed, rhythm, or strength of your heartbeat.  · You suddenly begin peeing (urinating) more often.  · You get tired more easily when moving or exercising.  GET HELP RIGHT AWAY IF:   · You have chest or belly (abdominal) pain.  · You feel sick to your stomach (nauseous).  · You are short of breath.  · You suddenly have swollen feet and ankles.  · You feel dizzy.  · You face, arms, or legs feel numb or weak.  · There is a change in your vision or speech.  MAKE SURE YOU:   · Understand these instructions.  · Will watch your condition.  · Will get help right away if you are not doing well or get worse.  Document Released: 12/25/2007 Document Revised: 08/01/2013 Document Reviewed: 04/27/2012  ExitCare® Patient Information ©2015 [employer]. This information is not  intended to replace advice given to you by your health care provider. Make sure you discuss any questions you have with your health care provider.

## 2014-11-14 NOTE — ED Provider Notes (Signed)
CSN: [id number]     Arrival date & time 11/14/14  0846 History   First MD Initiated Contact with Patient 11/14/14 [phone]     Chief Complaint  Patient presents with  . Atrial Fibrillation  . Weakness     (Consider location/radiation/quality/duration/timing/severity/associated sxs/prior Treatment) Patient is a 81 y.o. female presenting with atrial fibrillation. The history is provided by the patient, medical records and the spouse.  Atrial Fibrillation This is a chronic problem. The current episode started yesterday. The problem occurs constantly. The problem has been unchanged. Associated symptoms include fatigue and weakness (generalized). Pertinent negatives include no abdominal pain, arthralgias, chills, congestion, coughing, fever, headaches, myalgias, nausea, numbness, rash, urinary symptoms (denies hematuria or dysuria) or vomiting. Nothing aggravates the symptoms.   patient relates she was at home yesterday when she had onset of palpitations. She really she's had similar occurrences in the past and has had to come the emergency department for treatment. No history of cardioversion or ablation in the past. She currently denies any chest pain or shortness of breath. Blood pressure is stable at triage. No new medications. No infectious etiologies to lower threshold to go and RVR.  Past Medical History  Diagnosis Date  . Hyperlipidemia   . Mitral valve prolapse     Remotely - last echo 2011 did not show this  . ANEMIA-IRON DEFICIENCY     presumed GI bleed 08/2012- anticoag stopped  . LEUKOPENIA, MILD 05/30/2008  . Hypertension 11/12/2006  . COPD 03/09/2007    ? pt is unsure  . OSTEOARTHRITIS 02/19/2009    Spinal  . OSTEOPOROSIS 11/12/2006  . COLONIC POLYPS, HX OF 03/09/2007  . HYPERGLYCEMIA 07/02/2007  . PSVT     a. Remotely, in setting of anemia.  [name] Positive H. pylori test 10/1996  . Cataract   . Blood transfusion     a. 05/2010: 3 transfusions for anemia/GIB.  [name] GI bleed     a. 05/2010:  Hgb 7 at outside hospital s/p 3 u PRBC, EGD 05/2010 mild gastritis, small hiatal hernia, Schatzki ring, negative colonoscopy; capsule endoscopy 06/2010 - possible nonspecific inflammatory changes.;  b.  08/2012: 1/2 guiac +; required transfusion - Apixaban d/c'd  . Hx of echocardiogram     a. echo 2/11:  EF 55-60%, mild AI, mild RAE, mild to mod TR;   b.  a. Echo 2/14:  Mild focal basal and mild LVH, EF 60-65%, mild AI, mild RAE, trivia effusion  . NSTEMI (non-ST elevated myocardial infarction) 03/2012    a. Type II in setting of AF with RVR 03/2012  . Atrial fibrillation     a. Apixaban started 03/2012 - stopped after admx for GI bleed 6/14  . CAD (coronary artery disease)     a. LHC 1/14:  mLAD serial 30 and 60%, oRCA 30%, EF 60%  . Spinal stenosis of lumbar region 07/16/2012  . Asthma    Past Surgical History  Procedure Laterality Date  . Abdominal hysterectomy    . Tonsillectomy    . Tubal ligation    . Cataract extraction    . Cardiac catheterization  1992    S/P in 1992 at [facility] in [place]. This was negative for any coronary artery disease  . Carotid duplex  08/29/2003  . Ceasarean    . Enteroscopy N/A 10/10/2013    Procedure: ENTEROSCOPY;  Surgeon: Inda Castle, MD;  Location: [facility] ENDOSCOPY;  Service: Endoscopy;  Laterality: N/A;  . Left heart catheterization with  coronary angiogram N/A 04/23/2012    Procedure: LEFT HEART CATHETERIZATION WITH CORONARY ANGIOGRAM;  Surgeon: Larey Dresser, MD;  Location: [facility] CATH LAB;  Service: Cardiovascular;  Laterality: N/A;   Family History  Problem Relation Age of Onset  . Stomach cancer Mother   . Sarcoidosis Sister   . Colon cancer Brother   . Sarcoidosis Other   . CAD Neg Hx    Social History  Substance Use Topics  . Smoking status: Never Smoker   . Smokeless tobacco: Never Used  . Alcohol Use: No   OB History    No data available     Review of Systems  Constitutional: Positive for fatigue. Negative for  fever and chills.  HENT: Negative for congestion and rhinorrhea.   Respiratory: Negative for cough and shortness of breath.   Cardiovascular: Positive for palpitations.  Gastrointestinal: Negative for nausea, vomiting and abdominal pain.  Genitourinary: Negative for dysuria and hematuria.  Musculoskeletal: Negative for myalgias and arthralgias.  Skin: Negative for pallor and rash.  Neurological: Positive for weakness (generalized). Negative for numbness and headaches.  Psychiatric/Behavioral: Negative for confusion and agitation.  All other systems reviewed and are negative.     Allergies  Doxycycline; Biaxin; and Metronidazole  Home Medications   Prior to Admission medications   Medication Sig Start Date End Date Taking? Authorizing Provider  aspirin EC 81 MG tablet Take 1 tablet (81 mg total) by mouth daily. 10/12/12  Yes Scott T Kathlen Mody, PA-C  atorvastatin (LIPITOR) 20 MG tablet TAKE 1 TABLET (20 MG TOTAL) BY MOUTH DAILY. 07/13/14  Yes Biagio Borg, MD  b complex vitamins tablet Take 1 tablet by mouth 2 (two) times a week.    Yes Historical Provider, MD  ferrous sulfate 325 (65 FE) MG tablet Take 325 mg by mouth every evening.    Yes Historical Provider, MD  metoprolol succinate (TOPROL-XL) 50 MG 24 hr tablet TAKE 1 TABLET (50 MG TOTAL) BY MOUTH DAILY. 07/13/14  Yes Biagio Borg, MD  fluticasone (CUTIVATE) 0.05 % cream USE AS DIRECTED UP TO 4 TIMES PER DAY AS NEEDED FOR LEG RASH Patient not taking: Reported on 11/14/2014 07/31/14   Biagio Borg, MD  Fluticasone Propionate 0.05 % LOTN Apply to face up to 4 times daily Patient not taking: Reported on 11/14/2014 07/27/14   Biagio Borg, MD  tacrolimus (PROTOPIC) 0.1 % ointment Apply topically 2 (two) times daily. Patient not taking: Reported on 11/14/2014 07/27/14   Biagio Borg, MD   BP 113/68 mmHg  Pulse 80  Resp 25  SpO2 100% Physical Exam  Constitutional: She is oriented to person, place, and time. No distress.  Appears frail and  elderly  HENT:  Head: Normocephalic and atraumatic.  Eyes: Conjunctivae and EOM are normal.  Neck: Normal range of motion. Neck supple.  Cardiovascular: Normal heart sounds.  An irregularly irregular rhythm present. Tachycardia present.   No murmur heard. Pulmonary/Chest: Effort normal and breath sounds normal.  Abdominal: Soft. Bowel sounds are normal. There is no tenderness. There is no rebound and no guarding.  Musculoskeletal: Normal range of motion. She exhibits no tenderness.  Neurological: She is alert and oriented to person, place, and time.  Skin: Skin is warm and dry. She is not diaphoretic.  Psychiatric: She has a normal mood and affect. Her behavior is normal.  Nursing note and vitals reviewed.   ED Course  Procedures (including critical care time) Labs Review Labs Reviewed  Randolm Idol, ED -  Abnormal; Notable for the following:    Troponin i, poc 0.09 (*)    All other components within normal limits  PROTIME-INR  CBC WITH DIFFERENTIAL/PLATELET  BASIC METABOLIC PANEL  MAGNESIUM  PHOSPHORUS  PROTIME-INR  BASIC METABOLIC PANEL  CBC WITH DIFFERENTIAL/PLATELET    Imaging Review No results found. I, Theodosia Quay, personally reviewed and evaluated these images and lab results as part of my medical decision-making.   EKG Interpretation   Date/Time:  Tuesday November 14 2014 08:53:45 EDT Ventricular Rate:  126 PR Interval:    QRS Duration: 91 QT Interval:  323 QTC Calculation: 468 R Axis:   72 Text Interpretation:  Junctional tachycardia changed from prior ecg  Confirmed by CAMPOS  MD, Lennette Bihari ([id number]) on 11/14/2014 1:28:35 PM      MDM   Final diagnoses:  Atrial fibrillation with RVR  Other fatigue    Patient is a 81 year old female with past medical history of atrial fibrillation who presents today with palpitations that started yesterday. On exam patient is awake and alert and in no acute distress. The pressure is stable. She is notably tachycardic and  heart is irregularly irregular. Patient seemed to be stable with atrial fibrillation with rapid ventricular response. Initially tried Lopressor 2.5 mg every 5 minutes with no significant change and rate. Patient then given a 0.25 mg/kg dose of diltiazem. Patient heart rate responded well to this and patient converted to sinus rhythm. Repeat EKG shows patient with sinus rhythm without any ischemic changes. Patient was given her daily dose of Toprol XL 25 mg here. At this point we'll discharge the patient home with return precautions in place. Patient is agreeable with this plan.    Theodosia Quay, MD 11/14/14 [place], MD 11/14/14 [phone]

## 2014-11-14 NOTE — ED Notes (Addendum)
Pt reports she is very weak and had attack of palpitations starting at 1600 yesterday; sporadic. Lindsay Doyle is cardiologist. She did not call him. No electronic device implanted. Takes meds for afib and is usually controlled (says last attack was 5 mos ago when she came in here. No hx of ablations. States she has not passed out but did feel dizzy yesterday; no syncope.

## 2014-11-14 NOTE — ED Notes (Signed)
Pt brought back to room via wheelchair; pt undressed, in gown, on monitor, continuous pulse oximetry and blood pressure cuff; Lexine Baton, RN and [place], NT present in room

## 2014-11-14 NOTE — ED Notes (Signed)
MD at bedside. 

## 2014-11-25 IMAGING — US US RENAL
1 series · 14 of 25 positions shown · non-contrast
Comparison: Ultrasound January 09, 2012.

CLINICAL DATA: Acute kidney injury.

EXAM:
RENAL/URINARY TRACT ULTRASOUND COMPLETE

[Series 1: us renal · 0.18mm/px · 25 acquisitions, 14 frames shown]
[im 1/25]
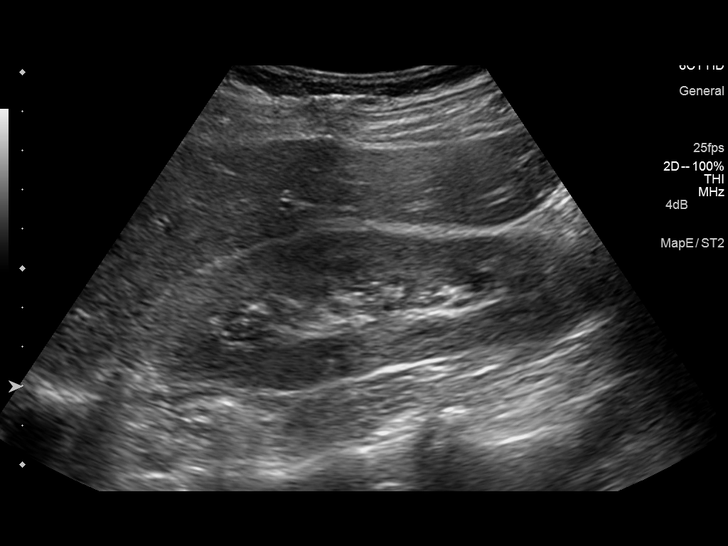
[im 3/25]
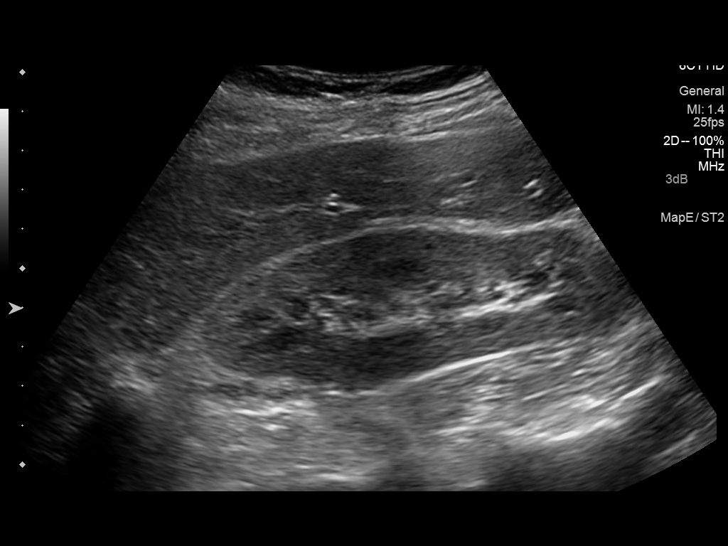
[im 5/25]
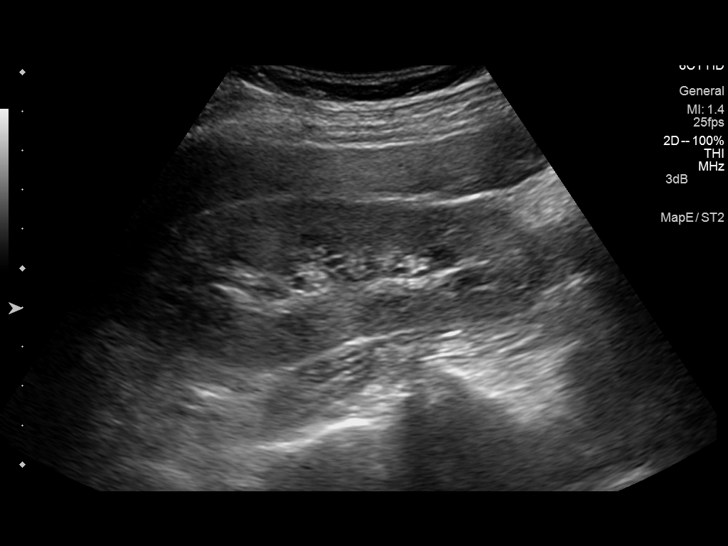
[im 7/25]
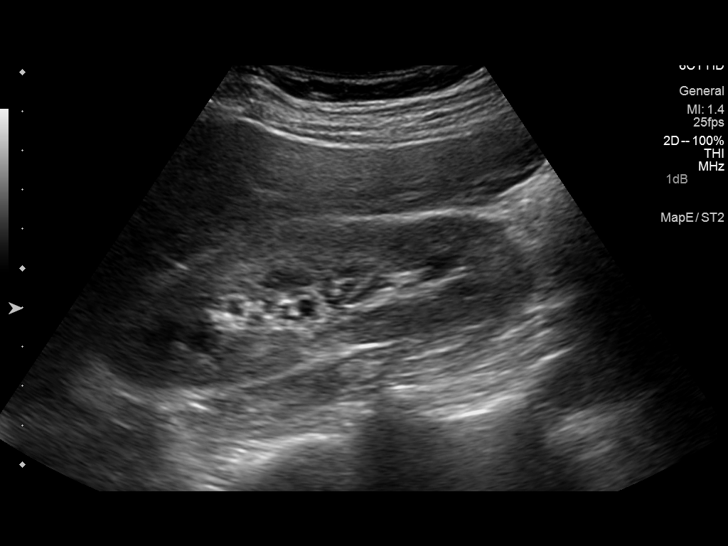
[im 9/25]
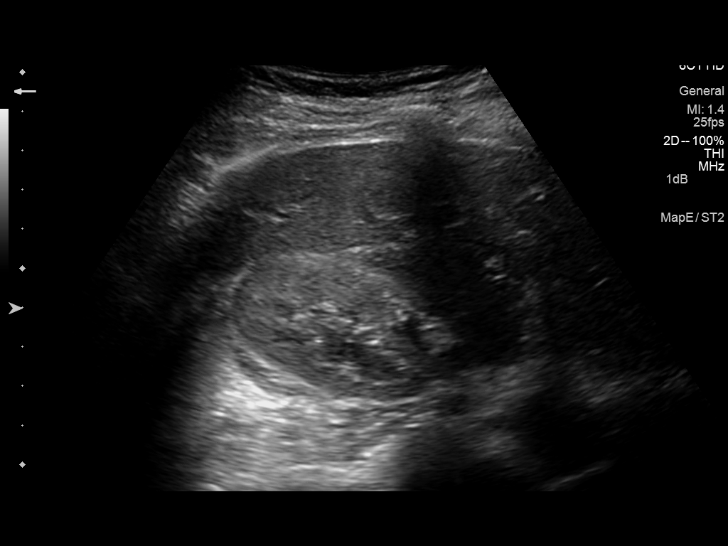
[im 10/25]
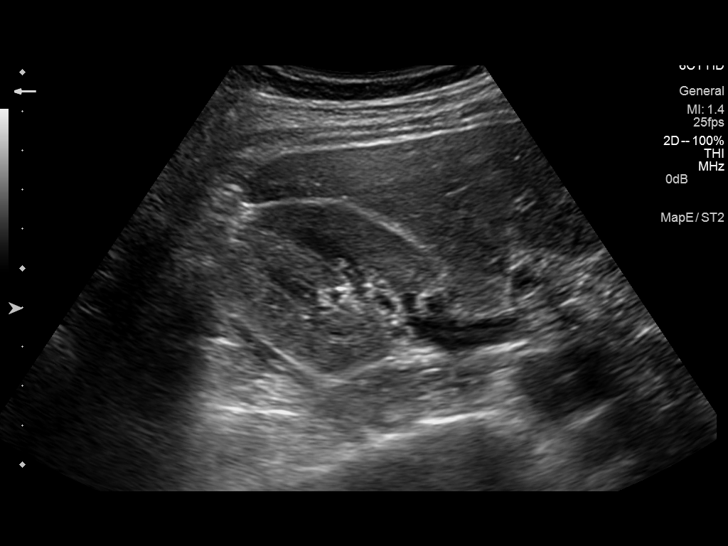
[im 12/25]
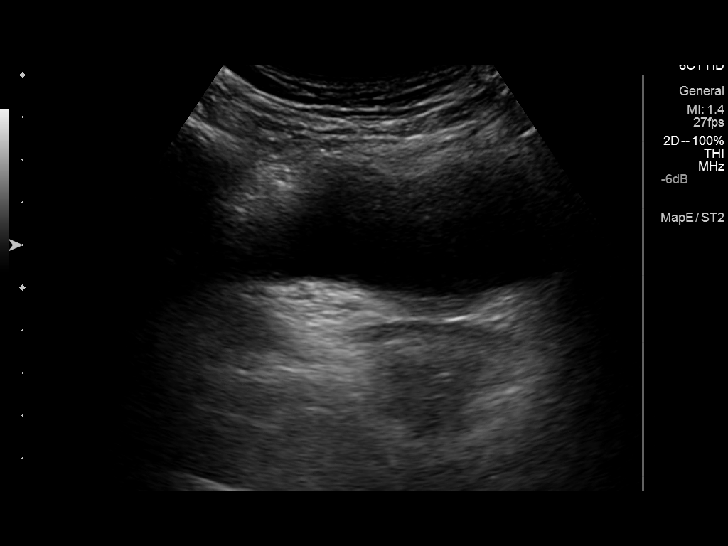
[im 14/25]
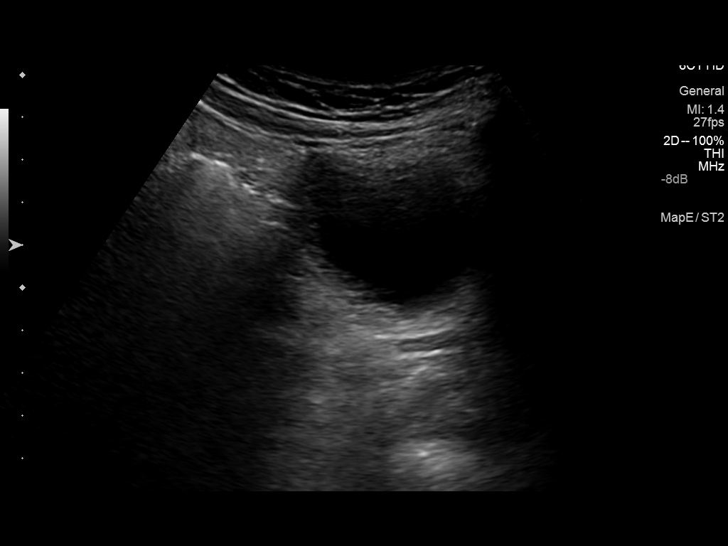
[im 16/25]
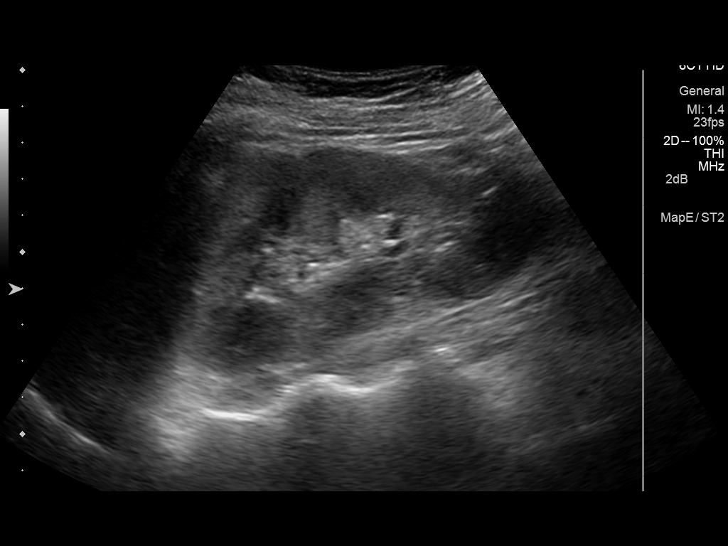
[im 17/25]
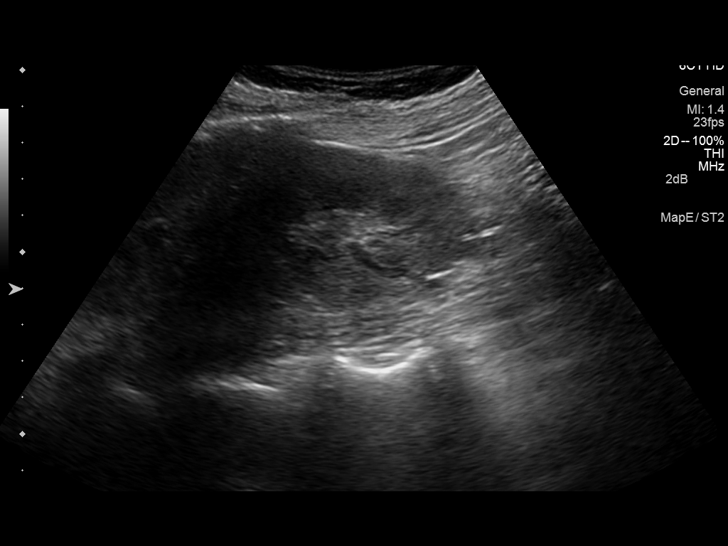
[im 19/25]
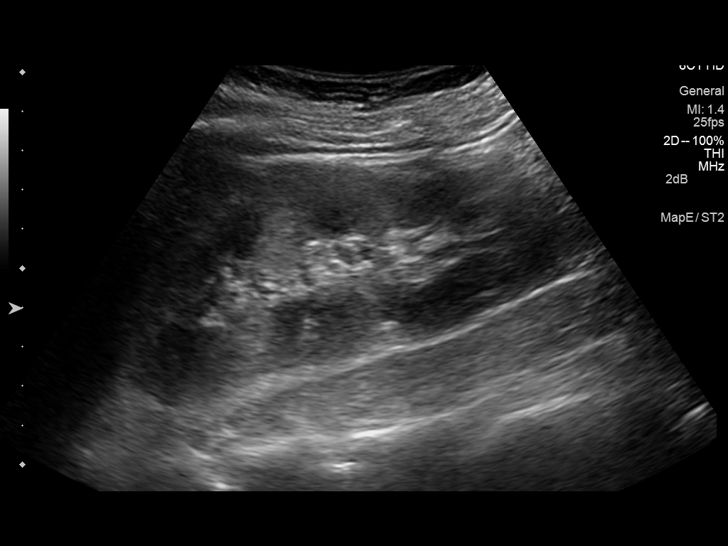
[im 21/25]
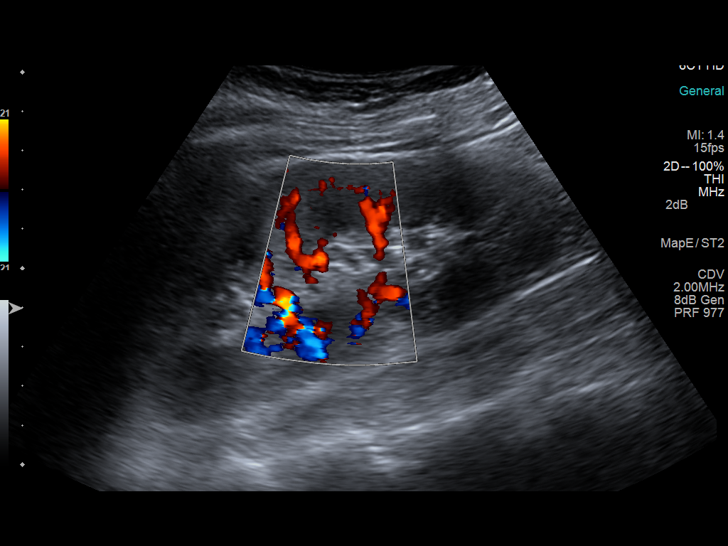
[im 23/25]
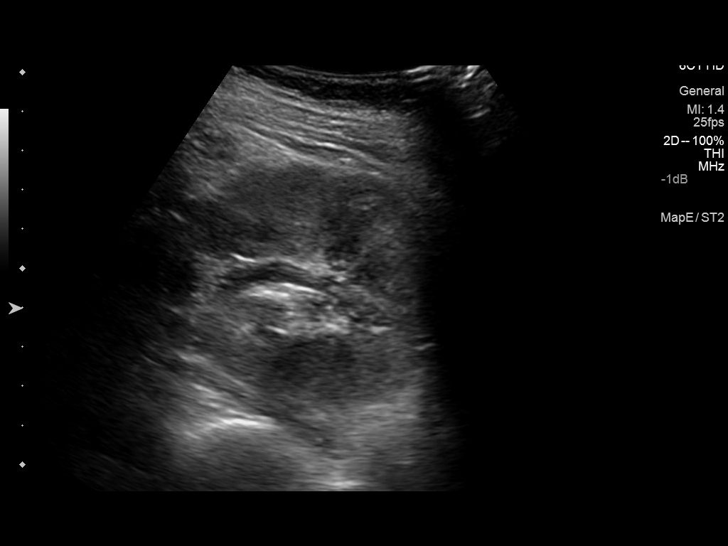
[im 25/25]
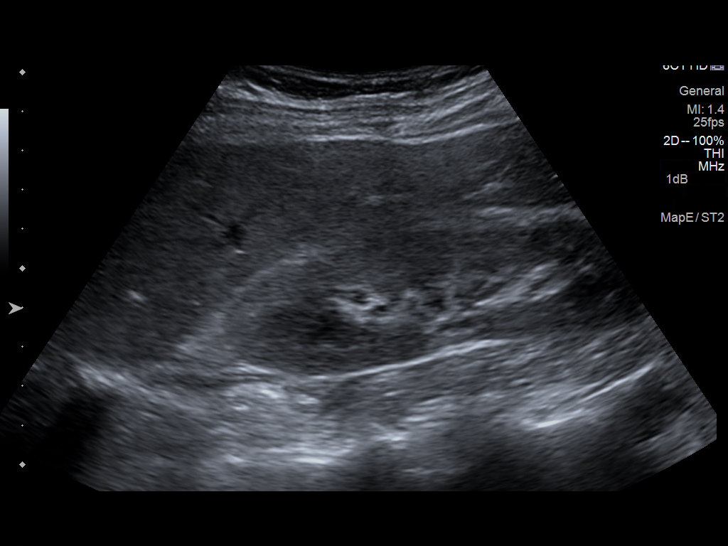

[14 of 25 positions shown; findings below may reference images not displayed]

FINDINGS: Right Kidney:

Length: 11.5 cm. Increased echogenicity of renal parenchyma is noted
consistent with medical renal disease. No mass or hydronephrosis
visualized.

Left Kidney:

Length: 11.7 cm. Increased echogenicity of renal parenchyma is noted
consistent with medical renal disease. No mass or hydronephrosis
visualized.

Bladder:

Appears normal for degree of bladder distention.
IMPRESSION: Increased echogenicity of renal parenchyma is noted bilaterally
consistent with medical renal disease. No hydronephrosis or renal
obstruction is noted.

## 2014-11-29 ENCOUNTER — Encounter: Payer: Self-pay | Admitting: Cardiology

## 2014-11-29 ENCOUNTER — Ambulatory Visit (INDEPENDENT_AMBULATORY_CARE_PROVIDER_SITE_OTHER): Payer: Medicare Other | Admitting: Cardiology

## 2014-11-29 ENCOUNTER — Telehealth: Payer: Self-pay | Admitting: *Deleted

## 2014-11-29 VITALS — BP 130/100 | HR 79 | Ht 64.0 in | Wt 113.0 lb

## 2014-11-29 DIAGNOSIS — I4891 Unspecified atrial fibrillation: Secondary | ICD-10-CM | POA: Diagnosis not present

## 2014-11-29 DIAGNOSIS — Z8719 Personal history of other diseases of the digestive system: Secondary | ICD-10-CM

## 2014-11-29 DIAGNOSIS — I480 Paroxysmal atrial fibrillation: Secondary | ICD-10-CM | POA: Diagnosis not present

## 2014-11-29 DIAGNOSIS — R710 Precipitous drop in hematocrit: Secondary | ICD-10-CM

## 2014-11-29 DIAGNOSIS — Q2733 Arteriovenous malformation of digestive system vessel: Secondary | ICD-10-CM

## 2014-11-29 DIAGNOSIS — R195 Other fecal abnormalities: Secondary | ICD-10-CM

## 2014-11-29 DIAGNOSIS — I10 Essential (primary) hypertension: Secondary | ICD-10-CM | POA: Diagnosis not present

## 2014-11-29 DIAGNOSIS — K31819 Angiodysplasia of stomach and duodenum without bleeding: Secondary | ICD-10-CM

## 2014-11-29 DIAGNOSIS — E785 Hyperlipidemia, unspecified: Secondary | ICD-10-CM | POA: Diagnosis not present

## 2014-11-29 LAB — CBC WITH DIFFERENTIAL/PLATELET
Basophils Absolute: 0 10*3/uL (ref 0.0–0.1)
Basophils Relative: 0.6 % (ref 0.0–3.0)
EOS PCT: 3.9 % (ref 0.0–5.0)
Eosinophils Absolute: 0.2 10*3/uL (ref 0.0–0.7)
HCT: 23.2 % — CL (ref 36.0–46.0)
Hemoglobin: 7.7 g/dL — CL (ref 12.0–15.0)
LYMPHS ABS: 1.7 10*3/uL (ref 0.7–4.0)
Lymphocytes Relative: 34.1 % (ref 12.0–46.0)
MCHC: 33 g/dL (ref 30.0–36.0)
MCV: 94.2 fl (ref 78.0–100.0)
MONOS PCT: 14.7 % — AB (ref 3.0–12.0)
Monocytes Absolute: 0.7 10*3/uL (ref 0.1–1.0)
NEUTROS PCT: 46.7 % (ref 43.0–77.0)
Neutro Abs: 2.3 10*3/uL (ref 1.4–7.7)
PLATELETS: 250 10*3/uL (ref 150.0–400.0)
RBC: 2.46 Mil/uL — ABNORMAL LOW (ref 3.87–5.11)
RDW: 14.6 % (ref 11.5–15.5)
WBC: 4.9 10*3/uL (ref 4.0–10.5)

## 2014-11-29 LAB — LIPID PANEL
Cholesterol: 139 mg/dL (ref 0–200)
HDL: 54.6 mg/dL (ref >39.00)
LDL Cholesterol: 71 mg/dL (ref 0–99)
NONHDL: 84.2
Total CHOL/HDL Ratio: 3
Triglycerides: 67 mg/dL (ref 0.0–149.0)
VLDL: 13.4 mg/dL (ref 0.0–40.0)

## 2014-11-29 LAB — TSH: TSH: 2.18 u[IU]/mL (ref 0.35–4.50)

## 2014-11-29 MED ORDER — PANTOPRAZOLE SODIUM 40 MG PO TBEC: 40.0000 mg | DELAYED_RELEASE_TABLET | Freq: Every day | ORAL | Status: DC

## 2014-11-29 NOTE — Telephone Encounter (Signed)
Stably low.  Needs to see GI.

## 2014-11-29 NOTE — Patient Instructions (Signed)
Medication Instructions:  Start Protonix 40mg  daily  Labwork: Lipid profile/TSH/CBCd today  Testing/Procedures: None today  Follow-Up: Your physician recommends that you schedule a follow-up appointment in: 2 months with Dr [name].   Any Other Special Instructions Will Be Listed Below (If Applicable).  You have been referred to Dr Melina Copa GI.

## 2014-11-29 NOTE — Telephone Encounter (Signed)
Pt advised appt with GI scheduled for 12/01/14

## 2014-11-29 NOTE — Telephone Encounter (Signed)
Call from [facility], Hgb 7.7, Hct 23.2---this was not repeated due to not enough blood in the sample. Informed Dr. Claris Gladden primary nurse.

## 2014-11-30 IMAGING — CR DG CHEST 2V
2 series · 2 of 2 positions shown · non-contrast
Comparison: October 20, 2013

CLINICAL DATA: Cough and hypertension

EXAM:
CHEST  2 VIEW

[view not recorded (1 of 2)]
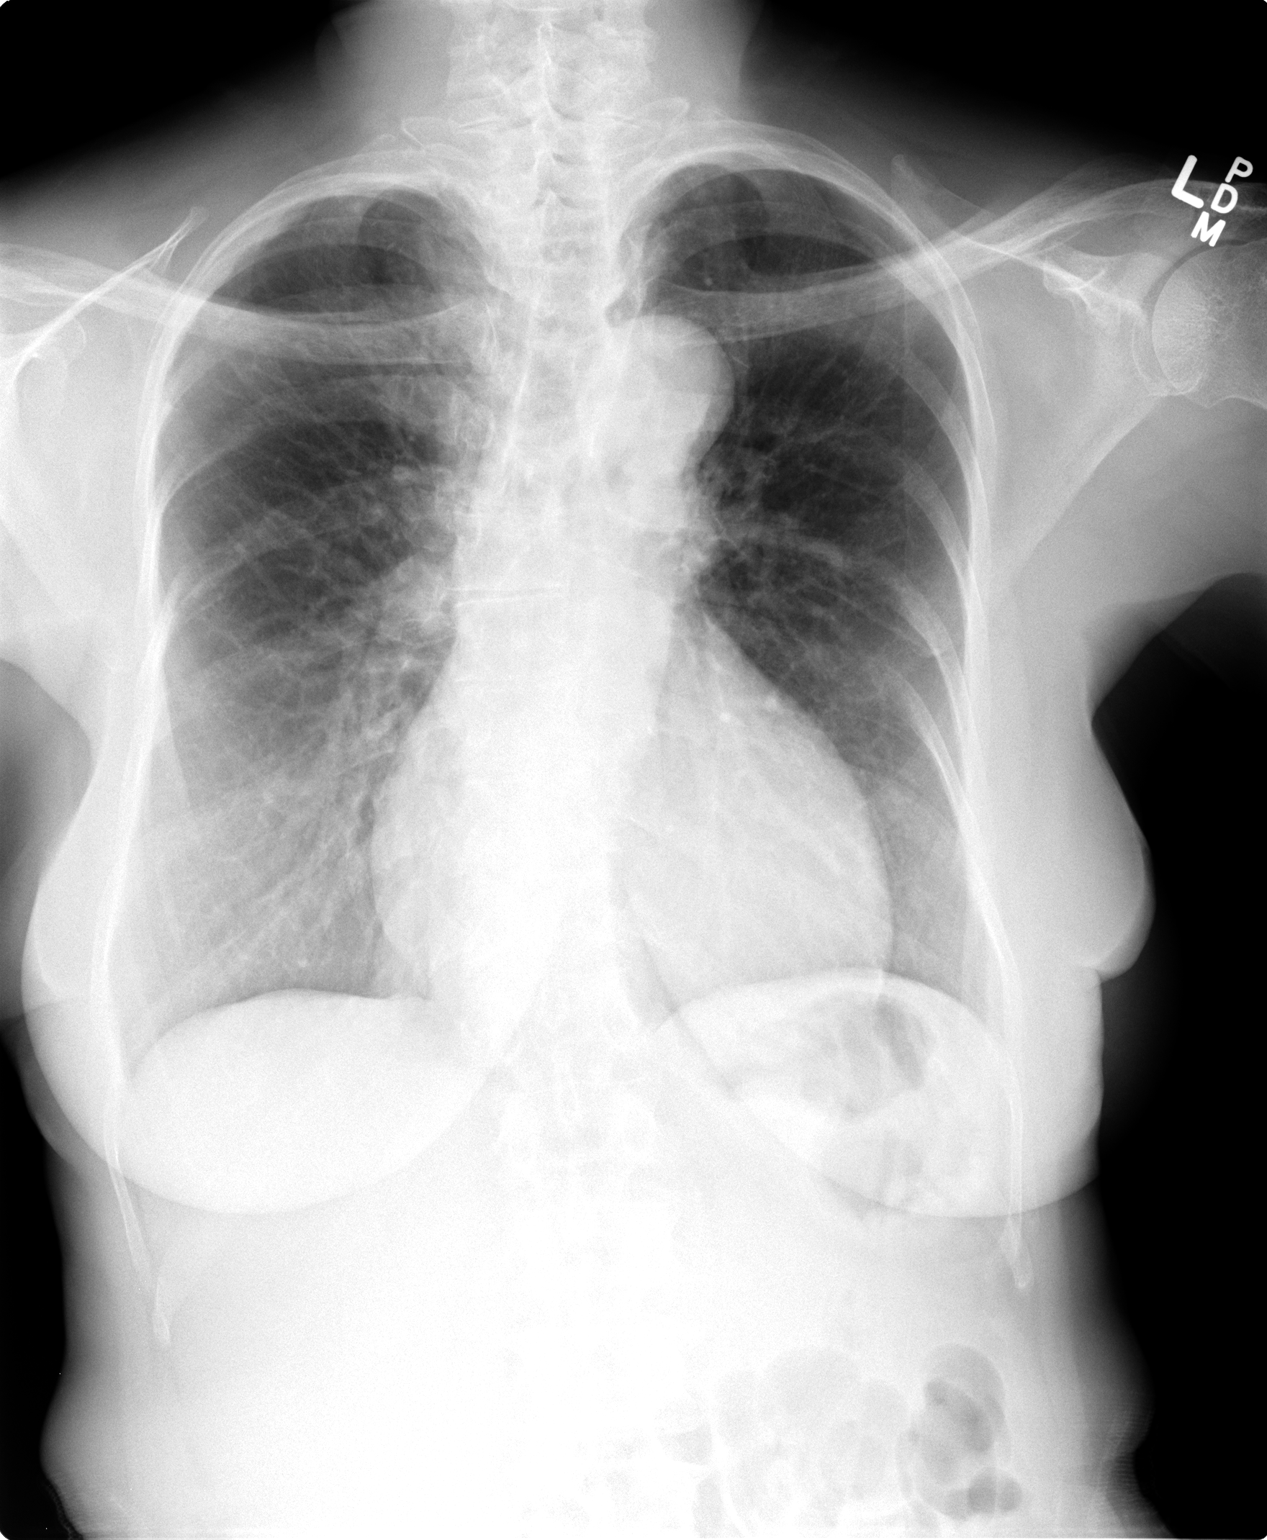

[view not recorded (2 of 2)]
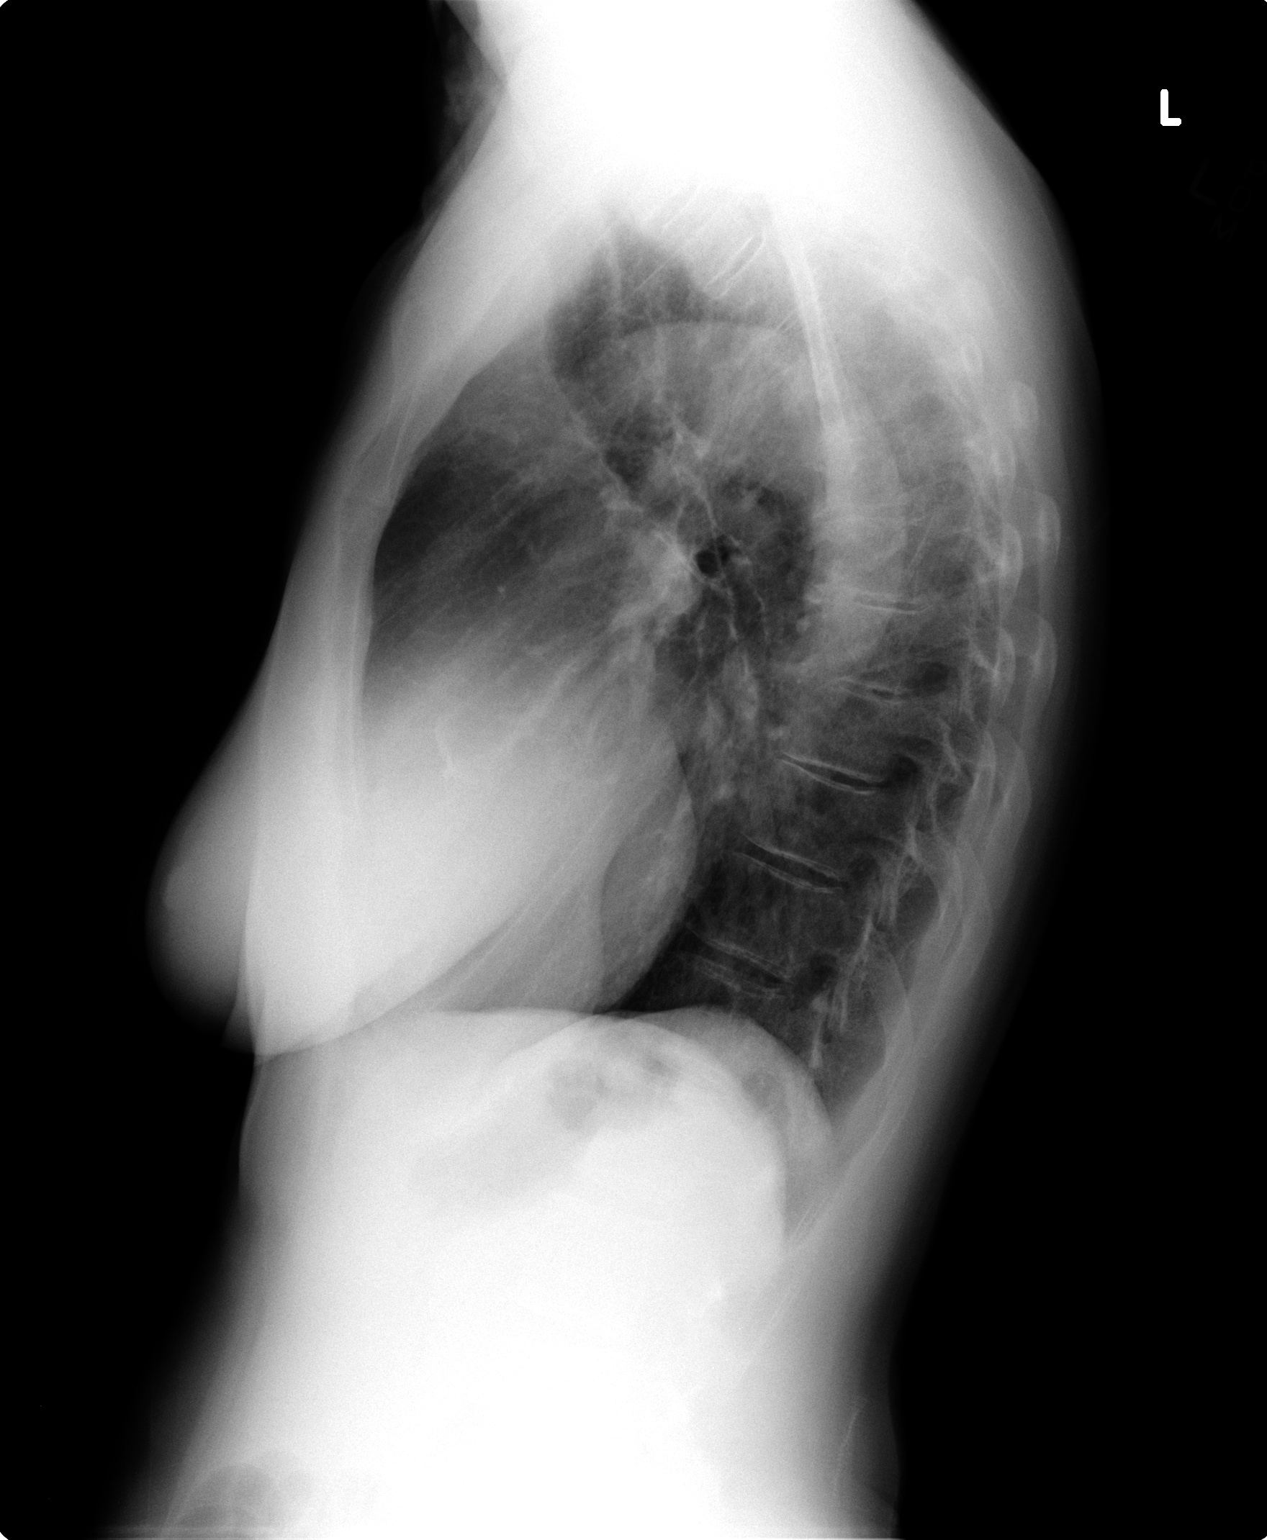

[2 of 2 positions shown; findings below may reference images not displayed]

FINDINGS: There is no edema or consolidation. Heart is mildly enlarged with
pulmonary vascularity within normal limits. No adenopathy. Aorta is
mildly prominent but stable. No bone lesions.
IMPRESSION: Mild cardiac prominence. Mild prominence of the aorta may reflect
chronic hypertension. No edema or consolidation.

## 2014-11-30 NOTE — Progress Notes (Signed)
Patient ID: Lindsay Doyle, female   DOB: [birth year], 81 y.o.   MRN: [id number] PCP: Dr. Jenny Reichmann  81 yo with history of paroxysmal atrial fibrillation presents for cardiology followup.  Lindsay Doyle was admitted in 1/14 with NSTEMI in the setting of AFib with RVR. Lindsay Doyle spontaneously converted to NSR. LHC demonstrated non-obstructive disease and Lindsay Doyle was felt to have had demand ischemia. Eliquis 2.5 mg bid was added due to stroke risk. Echo 05/20/12: EF 60-65%.  Lindsay Doyle was admitted in 6/14 to [facility] with anemia.  1/2 stool guaiacs positive.  Lindsay Doyle required transfusion. Eliquis was stopped due to concern that it triggered GI bleeding.  No scopes were done. Lindsay Doyle saw [name] back in followup and had a long discussion about anticoagulation.  Ultimately, Lindsay Doyle decided not to restart anticoagulation.    Lindsay Doyle had enteroscopy with cauterization of small bowel AVMs in 7/15.  After that, Lindsay Doyle was admitted with AKI thought possibly to be due to volume depletion.    Lindsay Doyle was seen in the ER 11/14/14 with atrial fibrillation and RVR.  Lindsay Doyle went back into NSR with diltiazem IV.  This was the first time Lindsay Doyle had felt Lindsay Doyle heart race in > 6 months.  In the ER, Lindsay Doyle hemoglobin was 7.4.  This is down considerably.  Lindsay Doyle husband thinks that Lindsay Doyle energy level is down.  Lindsay Doyle is in NSR today.  BP high here but is always ok at home, and Lindsay Doyle checks daily.  No dyspnea or chest pain.  No BRBPR, but stool is dark.  This has been chronic with iron therapy.   Labs (3/14): K 4.2, creatinine 1.0, LDL 84, HDL 82 Labs (10/14): K 3.9, creatinine 0.9, HCT 39.6, TSH normal, LDL 95, HDL 88 Labs (4/15): K 5.1, creatinine 0.9, LDL 95, HDL 71 Labs (9/15): K 3.7, creatinine 1.84 Labs (11/15): creatinine 1.06 Labs (8/16): Na 129, K 4.5, creatinine 0.97, hemoglobin 7.4  ECG: NSR, anterior TWIs  PMH: 1. Hyperlipidemia 2. Anemia: Fe deficiency.  Presumed GI bleed requiring transfusion in 6/14, anticoagulation stopped.  3. HTN: Tolerates HCTZ poorly. 4.  Osteoarthritis 5. H/o colonic polyps 6. Osteoporosis 7. GI bleed (3/12, 6/14): 3 units PRBCs, EGD with mild gastritis, colonoscopy negative, capsule endoscopy with nonspecific inflammatory changes.  6/14 GI bleeding (1/2 guaiac +), required transfusion. Enteroscopy 7/15 showed small bowel AVMs that were cauterized.  8. CAD: LHC (1/14) with serial 30% and 60% LAD stenoses, 30% ostial RCA, EF 60%.  9. Paroxysmal atrial fibrillation.  Echo (2/14) with EF 60-65%, mild focal basal septal hypertrophy, mild AI. 10. CKD  SH: Married, lives with husband in [place].  Has children living out of town.  Nonsmoker.  Retired.   FH: CAD  ROS: All systems reviewed and negative except as per HPI.   Current Outpatient Prescriptions  Medication Sig Dispense Refill  . aspirin EC 81 MG tablet Take 1 tablet (81 mg total) by mouth daily.    [name] atorvastatin (LIPITOR) 20 MG tablet TAKE 1 TABLET (20 MG TOTAL) BY MOUTH DAILY. 90 tablet 1  . b complex vitamins tablet Take 1 tablet by mouth 2 (two) times a week.     . ferrous sulfate 325 (65 FE) MG tablet Take 325 mg by mouth every evening.     . fluticasone (CUTIVATE) 0.05 % cream USE AS DIRECTED UP TO 4 TIMES PER DAY AS NEEDED FOR LEG RASH 30 g 0  . Fluticasone Propionate 0.05 % LOTN Apply to face up to 4 times daily 60  mL 3  . metoprolol succinate (TOPROL-XL) 50 MG 24 hr tablet TAKE 1 TABLET (50 MG TOTAL) BY MOUTH DAILY. 90 tablet 1  . pantoprazole (PROTONIX) 40 MG tablet Take 1 tablet (40 mg total) by mouth daily. 30 tablet 0  . tacrolimus (PROTOPIC) 0.1 % ointment Apply topically 2 (two) times daily. (Patient not taking: Reported on 11/14/2014) 100 g 2   No current facility-administered medications for this visit.    BP 130/100 mmHg  Pulse 79  Ht 5\' 4"  (1.626 m)  Wt 113 lb (51.256 kg)  BMI 19.39 kg/m2 General: NAD Neck: No JVD, no thyromegaly or thyroid nodule.  Lungs: Clear to auscultation bilaterally with normal respiratory effort. CV: Nondisplaced  PMI.  Heart regular S1/S2, no S3/S4, no murmur.  No peripheral edema.  No carotid bruit.  Normal pedal pulses.  Abdomen: Soft, nontender, no hepatosplenomegaly, no distention.  Neurologic: Alert and oriented x 3.  Psych: Normal affect. Extremities: No clubbing or cyanosis.   Assessment/Plan:  1. Paroxysmal atrial fibrillation: Lindsay Doyle had 1 episode of atrial fibrillation earlier this month.  Lindsay Doyle is in NSR today.  Continue Toprol XL.  Lindsay Doyle has not been anticoagulated due to GI bleeding.  The trigger for the recurrent atrial fibrillation episode may have been stress from anemia. Check TSH.  2. CAD: Nonobstructive.  No chest pain.  Suspect elevated troponin in 1/14 episode was due to demand ischemia from atrial fibrillation with RVR.  Continue statin and ASA 81.  Check lipids today.  3. HTN: BP high today but has been ok at home.   4. GI bleeding: Has history of small bowel AVMs that have been cauterized.  Hemoglobin was down to 7.4 when it was last checked.  Lindsay Doyle stool is dark but Lindsay Doyle also takes iron.   - Repeat CBC today. Transfuse hgb < 7.  - Start Protonix 40 daily. - Lindsay Doyle needs followup soon with GI, I will arrange.   Followup in 2 months.   Loralie Champagne 11/30/2014

## 2014-12-01 ENCOUNTER — Encounter: Payer: Self-pay | Admitting: Physician Assistant

## 2014-12-01 ENCOUNTER — Ambulatory Visit (INDEPENDENT_AMBULATORY_CARE_PROVIDER_SITE_OTHER): Payer: Medicare Other | Admitting: Physician Assistant

## 2014-12-01 VITALS — BP 108/66 | HR 90 | Ht 64.0 in | Wt 111.8 lb

## 2014-12-01 DIAGNOSIS — R195 Other fecal abnormalities: Secondary | ICD-10-CM

## 2014-12-01 DIAGNOSIS — D509 Iron deficiency anemia, unspecified: Secondary | ICD-10-CM

## 2014-12-01 NOTE — Patient Instructions (Signed)

## 2014-12-01 NOTE — Progress Notes (Signed)
Patient ID: Lindsay Doyle, female   DOB: 08/09/1933, 81 y.o.   MRN: [id number]     History of Present Illness: Lindsay Doyle is a 81 year old African-American female who is known to Dr. Deatra Ina from prior evaluation for anemia. She was last evaluated in the office in June 2015 with a history of having passed 3 dark semi-formed stools. She was not taking gastric irritants except for 81 mg of aspirin. She had a similar presentation in 2012 and required blood transfusions. Upper and lower endoscopy and capsule endoscopy were not diagnostic for a GI bleeding source. After her last visit she was scheduled for a capsule endoscopy and was noted to have gastric AVMs. Her small bowel AVMs were cauterized in July 2015.  She has a history of paroxysmal atrial fibrillation. She was admitted in January 2014 with an NSTEMI in the setting of A. fib with RVR. She spontaneously converted to anasarca. LHC demonstrated nonobstructive disease and she was felt to have had demand ischemia. Eliquis 2.5 mg twice daily was added due to her stroke risk. Echo in February 2014: EF 60-65%. She was admitted in June 2014 his anemia and stools were guaiac positive she was transfused and her eliquis  was discontinued due to her GI bleeding. She decided not to restart anticoagulation. She was seen in the emergency room 11/14/2014 with atrial fibrillation and RVR. She went back into normal sinus rhythm with diltiazem IV. Her hemoglobin was noted to be 7.4. She reports that her energy level has been left than normal. She states her stools are typically dark from iron but feels that for several days they were darker than normal.    Past Medical History  Diagnosis Date  . Hyperlipidemia   . Mitral valve prolapse     Remotely - last echo 2011 did not show this  . ANEMIA-IRON DEFICIENCY     presumed GI bleed 08/2012- anticoag stopped  . LEUKOPENIA, MILD 05/30/2008  . Hypertension 11/12/2006  . COPD 03/09/2007    ? pt is unsure  .  OSTEOARTHRITIS 02/19/2009    Spinal  . OSTEOPOROSIS 11/12/2006  . COLONIC POLYPS, HX OF 03/09/2007  . HYPERGLYCEMIA 07/02/2007  . PSVT     a. Remotely, in setting of anemia.  [name] Positive H. pylori test 10/1996  . Cataract   . Blood transfusion     a. 05/2010: 3 transfusions for anemia/GIB.  [name] GI bleed     a. 05/2010: Hgb 7 at outside hospital s/p 3 u PRBC, EGD 05/2010 mild gastritis, small hiatal hernia, Schatzki ring, negative colonoscopy; capsule endoscopy 06/2010 - possible nonspecific inflammatory changes.;  b.  08/2012: 1/2 guiac +; required transfusion - Apixaban d/c'd  . Hx of echocardiogram     a. echo 2/11:  EF 55-60%, mild AI, mild RAE, mild to mod TR;   b.  a. Echo 2/14:  Mild focal basal and mild LVH, EF 60-65%, mild AI, mild RAE, trivia effusion  . NSTEMI (non-ST elevated myocardial infarction) 03/2012    a. Type II in setting of AF with RVR 03/2012  . Atrial fibrillation     a. Apixaban started 03/2012 - stopped after admx for GI bleed 6/14  . CAD (coronary artery disease)     a. LHC 1/14:  mLAD serial 30 and 60%, oRCA 30%, EF 60%  . Spinal stenosis of lumbar region 07/16/2012  . Asthma     Past Surgical History  Procedure Laterality Date  . Abdominal hysterectomy    .  Tonsillectomy    . Tubal ligation    . Cataract extraction    . Cardiac catheterization  1992    S/P in 1992 at [facility] in [place]. This was negative for any coronary artery disease  . Carotid duplex  08/29/2003  . Ceasarean    . Enteroscopy N/A 10/10/2013    Procedure: ENTEROSCOPY;  Surgeon: Inda Castle, MD;  Location: [facility] ENDOSCOPY;  Service: Endoscopy;  Laterality: N/A;  . Left heart catheterization with coronary angiogram N/A 04/23/2012    Procedure: LEFT HEART CATHETERIZATION WITH CORONARY ANGIOGRAM;  Surgeon: Larey Dresser, MD;  Location: [facility] CATH LAB;  Service: Cardiovascular;  Laterality: N/A;   Family History  Problem Relation Age of Onset  . Stomach cancer Mother   .  Sarcoidosis Sister   . Colon cancer Brother   . Sarcoidosis Other   . CAD Neg Hx    Social History  Substance Use Topics  . Smoking status: Never Smoker   . Smokeless tobacco: Never Used  . Alcohol Use: No   Current Outpatient Prescriptions  Medication Sig Dispense Refill  . aspirin EC 81 MG tablet Take 1 tablet (81 mg total) by mouth daily.    [name] atorvastatin (LIPITOR) 20 MG tablet TAKE 1 TABLET (20 MG TOTAL) BY MOUTH DAILY. 90 tablet 1  . b complex vitamins tablet Take 1 tablet by mouth 2 (two) times a week.     . ferrous sulfate 325 (65 FE) MG tablet Take 325 mg by mouth 2 (two) times daily with a meal.     . metoprolol succinate (TOPROL-XL) 50 MG 24 hr tablet TAKE 1 TABLET (50 MG TOTAL) BY MOUTH DAILY. 90 tablet 1  . pantoprazole (PROTONIX) 40 MG tablet Take 1 tablet (40 mg total) by mouth daily. 30 tablet 0   No current facility-administered medications for this visit.   Allergies  Allergen Reactions  . Doxycycline Diarrhea    Possible diarrhea  . Biaxin [Clarithromycin] Other (See Comments)    diarrhea  . Metronidazole Other (See Comments)    Pt had difficulty swallowing this and says it was "horrible" to take. No allergic reaction.     Review of Systems: Gen: Denies any fever, chills, sweats, anorexia,  malaise, weight loss, and sleep disorder CV: Denies chest pain, angina, palpitations, syncope, orthopnea, PND, peripheral edema, and claudication. Resp: Denies dyspnea at rest, dyspnea with exercise, cough, sputum, wheezing, coughing up blood, and pleurisy. GI: Denies vomiting blood, jaundice, and fecal incontinence.   Denies dysphagia or odynophagia. GU : Denies urinary burning, blood in urine, urinary frequency, urinary hesitancy, nocturnal urination, and urinary incontinence. MS: Denies joint pain, limitation of movement, and swelling, stiffness, low back pain, extremity pain. Denies muscle weakness, cramps, atrophy.  Derm: Denies rash, itching, dry skin, hives, moles,  warts, or unhealing ulcers.  Psych: Denies depression, anxiety, memory loss, suicidal ideation, hallucinations, paranoia, and confusion. Heme: Denies bruising, bleeding, and enlarged lymph nodes. Neuro:  Denies any headaches, dizziness, paresthesia Endo:  Denies any problems with DM, thyroid, adrenal  LAB RESULTS:  Recent Labs  11/29/14 1453  WBC 4.9  HGB 7.7 cL*  HCT 23.2 aL*  PLT 250.0     Physical Exam: BP 108/66 mmHg  Pulse 90  Ht 5\' 4"  (1.626 m)  Wt 111 lb 12.8 oz (50.712 kg)  BMI 19.18 kg/m2 General: Pleasant, well developed , African-American female in no acute distress Head: Normocephalic and atraumatic Eyes:  sclerae anicteric, conjunctiva pink  Ears: Normal auditory acuity Lungs: Clear throughout to auscultation Heart: Regular rate and rhythm Abdomen: Soft, non distended, non-tender. No masses, no hepatomegaly. Normal bowel sounds Rectal: Dark stool, heme positive Musculoskeletal: Symmetrical with no gross deformities  Extremities: No edema  Neurological: Alert oriented x 4, grossly nonfocal Psychological:  Alert and cooperative. Normal mood and affect  Assessment and Recommendations: 81 year old female with a history of GI bleed/small bowel AVMs, found to have a recurrent anemia and heme positive stools. Patient was started on proton next yesterday and has been instructed to continue this. She will be scheduled for an EGD with APC if gastric AVMs are again noted.The risks, benefits, and alternatives to endoscopy with possible biopsy and possible dilation were discussed with the patient and they consent to proceed.  The procedure will be scheduled with Dr. Deatra Ina. Further recommendations will be made pending the findings of the above.        Vernestine Brodhead, Vita Barley PA-C 12/01/2014,

## 2014-12-05 ENCOUNTER — Telehealth: Payer: Self-pay | Admitting: Physician Assistant

## 2014-12-05 NOTE — Telephone Encounter (Signed)
Spoke with patient and told her Cecille Rubin Hvozdovic, PA-C is out of office for a few days. Explained why she is scheduled for the procedure. She understands and does not need Lori Hvozdovic, PA-C to call her.

## 2014-12-06 NOTE — Progress Notes (Signed)
Reviewed and agree with management.  I would add enteroscopy to the EGD to potentially treat any small bowel AVMs. Sandy Salaam. Deatra Ina, M.D., [facility]

## 2014-12-08 ENCOUNTER — Ambulatory Visit (INDEPENDENT_AMBULATORY_CARE_PROVIDER_SITE_OTHER): Payer: Medicare Other | Admitting: Internal Medicine

## 2014-12-08 ENCOUNTER — Encounter: Payer: Self-pay | Admitting: Internal Medicine

## 2014-12-08 VITALS — BP 108/60 | HR 92 | Temp 99.0°F | Ht 64.0 in | Wt 112.0 lb

## 2014-12-08 DIAGNOSIS — I10 Essential (primary) hypertension: Secondary | ICD-10-CM | POA: Diagnosis not present

## 2014-12-08 DIAGNOSIS — M791 Myalgia, unspecified site: Secondary | ICD-10-CM | POA: Insufficient documentation

## 2014-12-08 NOTE — Patient Instructions (Addendum)
OK to stop the lipitor for 1 month  OK to take the tylenol 650 mg  - 1 pill every 8 hrs as needed  Please continue all other medications as before, and refills have been done if requested.  Please have the pharmacy call with any other refills you may need.  Please continue your efforts at being more active, low cholesterol diet, and weight control.  Please keep your appointments with your specialists as you may have planned

## 2014-12-08 NOTE — Assessment & Plan Note (Signed)
I suspect fibromyalgia most likely or possible other in differential including some type of myositis or rheumatologic related, and doubt lipitor related as only symptomatic for 1 mo and has taken med for several years, but pt is very angry with this suggestion, adamant she will not consider any other possible etiology or other evaluation at this time such as esr, crp or other;  pt simply wants me to authorize stopping the lipitor; so ok to stop lipitor x 1 mo holiday, then re-start to gauge if symtpoms return, ok for tylenol use prn in the meantime  Note:  Total time for pt hx, exam, review of record with pt in the room, determination of diagnoses and plan for further eval and tx is > 40 min, with over 50% spent in coordination and counseling of patient

## 2014-12-08 NOTE — Assessment & Plan Note (Signed)
stable overall by history and exam, recent data reviewed with pt, and pt to continue medical treatment as before,  to f/u any worsening symptoms or concerns BP Readings from Last 3 Encounters:  12/08/14 108/60  12/01/14 108/66  11/29/14 130/100

## 2014-12-08 NOTE — Progress Notes (Signed)
Subjective:    Patient ID: Lindsay Doyle, female    DOB: [birth year], 81 y.o.   MRN: [id number]  HPI  Here to f/u; overall doing ok,  Pt denies chest pain, increasing sob or doe, wheezing, orthopnea, PND, increased LE swelling, palpitations, dizziness or syncope.  Pt denies new neurological symptoms such as new headache, or facial or extremity weakness or numbness.  Pt denies polydipsia, polyuria, or low sugar episode.   Pt denies new neurological symptoms such as new headache, or facial or extremity weakness or numbness.   Pt states overall good compliance with meds, mostly trying to follow appropriate diet, with wt overall stable,  but little exercise however.  Has had worsening back pains thought due to mattress but not better with new mattress, did get better with tylenol, wondering if from lipitor b/c her friends said this.  BP's at home have been similar but SBP has been occas 85. Wonders if taking too much iron at 2 per day.  Also with another recent "attack" with afib and anemia, wonders today why she really has to have the EGD and small bowel series.rec/d by the GI PA, since she was not able to see Dr Deatra Ina. Past Medical History  Diagnosis Date  . Hyperlipidemia   . Mitral valve prolapse     Remotely - last echo 2011 did not show this  . ANEMIA-IRON DEFICIENCY     presumed GI bleed 08/2012- anticoag stopped  . LEUKOPENIA, MILD 05/30/2008  . Hypertension 11/12/2006  . COPD 03/09/2007    ? pt is unsure  . OSTEOARTHRITIS 02/19/2009    Spinal  . OSTEOPOROSIS 11/12/2006  . COLONIC POLYPS, HX OF 03/09/2007  . HYPERGLYCEMIA 07/02/2007  . PSVT     a. Remotely, in setting of anemia.  [name] Positive H. pylori test 10/1996  . Cataract   . Blood transfusion     a. 05/2010: 3 transfusions for anemia/GIB.  [name] GI bleed     a. 05/2010: Hgb 7 at outside hospital s/p 3 u PRBC, EGD 05/2010 mild gastritis, small hiatal hernia, Schatzki ring, negative colonoscopy; capsule endoscopy 06/2010 - possible nonspecific  inflammatory changes.;  b.  08/2012: 1/2 guiac +; required transfusion - Apixaban d/c'd  . Hx of echocardiogram     a. echo 2/11:  EF 55-60%, mild AI, mild RAE, mild to mod TR;   b.  a. Echo 2/14:  Mild focal basal and mild LVH, EF 60-65%, mild AI, mild RAE, trivia effusion  . NSTEMI (non-ST elevated myocardial infarction) 03/2012    a. Type II in setting of AF with RVR 03/2012  . Atrial fibrillation     a. Apixaban started 03/2012 - stopped after admx for GI bleed 6/14  . CAD (coronary artery disease)     a. LHC 1/14:  mLAD serial 30 and 60%, oRCA 30%, EF 60%  . Spinal stenosis of lumbar region 07/16/2012  . Asthma    Past Surgical History  Procedure Laterality Date  . Abdominal hysterectomy    . Tonsillectomy    . Tubal ligation    . Cataract extraction    . Cardiac catheterization  1992    S/P in 1992 at [facility] in [place]. This was negative for any coronary artery disease  . Carotid duplex  08/29/2003  . Ceasarean    . Enteroscopy N/A 10/10/2013    Procedure: ENTEROSCOPY;  Surgeon: Inda Castle, MD;  Location: [facility] ENDOSCOPY;  Service: Endoscopy;  Laterality:  N/A;  . Left heart catheterization with coronary angiogram N/A 04/23/2012    Procedure: LEFT HEART CATHETERIZATION WITH CORONARY ANGIOGRAM;  Surgeon: Larey Dresser, MD;  Location: [facility] CATH LAB;  Service: Cardiovascular;  Laterality: N/A;    reports that she has never smoked. She has never used smokeless tobacco. She reports that she does not drink alcohol or use illicit drugs. family history includes Colon cancer in her brother; Sarcoidosis in her other and sister; Stomach cancer in her mother. There is no history of CAD. Allergies  Allergen Reactions  . Doxycycline Diarrhea    Possible diarrhea  . Biaxin [Clarithromycin] Other (See Comments)    diarrhea  . Metronidazole Other (See Comments)    Pt had difficulty swallowing this and says it was "horrible" to take. No allergic reaction.   Current  Outpatient Prescriptions on File Prior to Visit  Medication Sig Dispense Refill  . aspirin EC 81 MG tablet Take 81 mg by mouth every morning.     [name] atorvastatin (LIPITOR) 20 MG tablet TAKE 1 TABLET (20 MG TOTAL) BY MOUTH DAILY. (Patient taking differently: Take 20 mg by mouth. ) 90 tablet 1  . b complex vitamins tablet Take 1 tablet by mouth daily.     . ferrous sulfate 325 (65 FE) MG tablet Take 325 mg by mouth 2 (two) times daily with a meal.     . metoprolol succinate (TOPROL-XL) 50 MG 24 hr tablet TAKE 1 TABLET (50 MG TOTAL) BY MOUTH DAILY. (Patient taking differently: Take 50 mg by mouth daily. ) 90 tablet 1  . pantoprazole (PROTONIX) 40 MG tablet Take 1 tablet (40 mg total) by mouth daily. 30 tablet 0   No current facility-administered medications on file prior to visit.   Review of Systems  Constitutional: Negative for unusual diaphoresis or night sweats HENT: Negative for ringing in ear or discharge Eyes: Negative for double vision or worsening visual disturbance.  Respiratory: Negative for choking and stridor.   Gastrointestinal: Negative for vomiting or other signifcant bowel change Genitourinary: Negative for hematuria or change in urine volume.  Musculoskeletal: Negative for other MSK pain or swelling Skin: Negative for color change and worsening wound.  Neurological: Negative for tremors and numbness other than noted  Psychiatric/Behavioral: Negative for decreased concentration or agitation other than above       Objective:   Physical Exam BP 108/60 mmHg  Pulse 92  Temp(Src) 99 F (37.2 C) (Oral)  Ht 5\' 4"  (1.626 m)  Wt 112 lb (50.803 kg)  BMI 19.22 kg/m2  SpO2 97% VS noted,  Constitutional: Pt appears in no significant distress HENT: Head: NCAT.  Right Ear: External ear normal.  Left Ear: External ear normal.  Eyes: . Pupils are equal, round, and reactive to light. Conjunctivae and EOM are normal Neck: Normal range of motion. Neck supple.  Cardiovascular: Normal  rate and regular rhythm.   Pulmonary/Chest: Effort normal and breath sounds without rales or wheezing.  Abd:  Soft, NT, ND, + BS Spine: nontender Diffuse upper/mid/lower back bilat tender area without swelling or rash or other skin change Neurological: Pt is alert. Not confused , motor grossly intact Skin: Skin is warm. No rash, no LE edema Psychiatric: Pt behavior is normal. No agitation.     Assessment & Plan:

## 2014-12-08 NOTE — Progress Notes (Signed)
Pre visit review using our clinic review tool, if applicable. No additional management support is needed unless otherwise documented below in the visit note. 

## 2014-12-18 ENCOUNTER — Telehealth: Payer: Self-pay | Admitting: *Deleted

## 2014-12-18 NOTE — Telephone Encounter (Signed)
Dr [name] reviewed log of BP readings 11/29/14-12/13/14 faxed by pt: 103/79,91/65,91/62,93/69, 83/61,85/63,95/71, 91/66,97/70,97/69,106/79,107/72,101/70,103/62,85/60, 89/64,88/66,99/71,86/60.  Per Dr [name]--  BP low Make sure pt not symptomatic.  I spoke with pt--pt denies any lightheadedness or dizziness.  Pt advised to call if she develops symptoms.

## 2014-12-26 ENCOUNTER — Telehealth: Payer: Self-pay | Admitting: Physician Assistant

## 2014-12-26 NOTE — Telephone Encounter (Signed)
Patient wanted to clarify that she could take her medications in AM with sip of water as per her instructions. Also, wanted to know when her last EGD was done. Date given to patient.

## 2014-12-27 ENCOUNTER — Encounter (HOSPITAL_COMMUNITY): Payer: Self-pay | Admitting: *Deleted

## 2014-12-27 ENCOUNTER — Other Ambulatory Visit: Payer: Self-pay | Admitting: Cardiology

## 2014-12-28 ENCOUNTER — Ambulatory Visit (HOSPITAL_COMMUNITY): Payer: Medicare Other | Admitting: Certified Registered Nurse Anesthetist

## 2014-12-28 ENCOUNTER — Ambulatory Visit (HOSPITAL_COMMUNITY)
Admission: RE | Admit: 2014-12-28 | Discharge: 2014-12-28 | Disposition: A | Discharge status: Home / Self Care | Payer: Medicare Other | Source: Ambulatory Visit | Attending: Gastroenterology | Admitting: Gastroenterology

## 2014-12-28 ENCOUNTER — Encounter (HOSPITAL_COMMUNITY)
Admission: RE | Disposition: A | Discharge status: Home / Self Care | Payer: Self-pay | Source: Ambulatory Visit | Attending: Gastroenterology

## 2014-12-28 ENCOUNTER — Encounter (HOSPITAL_COMMUNITY): Payer: Self-pay

## 2014-12-28 ENCOUNTER — Other Ambulatory Visit: Payer: Self-pay | Admitting: Internal Medicine

## 2014-12-28 DIAGNOSIS — E785 Hyperlipidemia, unspecified: Secondary | ICD-10-CM | POA: Diagnosis not present

## 2014-12-28 DIAGNOSIS — J45909 Unspecified asthma, uncomplicated: Secondary | ICD-10-CM | POA: Insufficient documentation

## 2014-12-28 DIAGNOSIS — I252 Old myocardial infarction: Secondary | ICD-10-CM | POA: Insufficient documentation

## 2014-12-28 DIAGNOSIS — K219 Gastro-esophageal reflux disease without esophagitis: Secondary | ICD-10-CM | POA: Insufficient documentation

## 2014-12-28 DIAGNOSIS — I10 Essential (primary) hypertension: Secondary | ICD-10-CM | POA: Diagnosis not present

## 2014-12-28 DIAGNOSIS — I480 Paroxysmal atrial fibrillation: Secondary | ICD-10-CM | POA: Diagnosis not present

## 2014-12-28 DIAGNOSIS — M1990 Unspecified osteoarthritis, unspecified site: Secondary | ICD-10-CM | POA: Diagnosis not present

## 2014-12-28 DIAGNOSIS — I2510 Atherosclerotic heart disease of native coronary artery without angina pectoris: Secondary | ICD-10-CM | POA: Diagnosis not present

## 2014-12-28 DIAGNOSIS — Z7901 Long term (current) use of anticoagulants: Secondary | ICD-10-CM | POA: Diagnosis not present

## 2014-12-28 DIAGNOSIS — Z86010 Personal history of colonic polyps: Secondary | ICD-10-CM | POA: Diagnosis not present

## 2014-12-28 DIAGNOSIS — Z79899 Other long term (current) drug therapy: Secondary | ICD-10-CM | POA: Diagnosis not present

## 2014-12-28 DIAGNOSIS — I341 Nonrheumatic mitral (valve) prolapse: Secondary | ICD-10-CM | POA: Diagnosis not present

## 2014-12-28 DIAGNOSIS — Z7982 Long term (current) use of aspirin: Secondary | ICD-10-CM | POA: Diagnosis not present

## 2014-12-28 DIAGNOSIS — R195 Other fecal abnormalities: Secondary | ICD-10-CM | POA: Diagnosis not present

## 2014-12-28 DIAGNOSIS — D509 Iron deficiency anemia, unspecified: Secondary | ICD-10-CM | POA: Diagnosis not present

## 2014-12-28 DIAGNOSIS — J449 Chronic obstructive pulmonary disease, unspecified: Secondary | ICD-10-CM | POA: Insufficient documentation

## 2014-12-28 DIAGNOSIS — D649 Anemia, unspecified: Secondary | ICD-10-CM | POA: Insufficient documentation

## 2014-12-28 DIAGNOSIS — Z8719 Personal history of other diseases of the digestive system: Secondary | ICD-10-CM | POA: Insufficient documentation

## 2014-12-28 DIAGNOSIS — Z800 Family history of malignant neoplasm of digestive organs: Secondary | ICD-10-CM | POA: Insufficient documentation

## 2014-12-28 HISTORY — DX: Personal history of other medical treatment: Z92.89

## 2014-12-28 HISTORY — PX: ENTEROSCOPY: SHX5533

## 2014-12-28 HISTORY — PX: HOT HEMOSTASIS: SHX5433

## 2014-12-28 HISTORY — DX: Gastro-esophageal reflux disease without esophagitis: K21.9

## 2014-12-28 SURGERY — ENTEROSCOPY
Anesthesia: Monitor Anesthesia Care

## 2014-12-28 MED ORDER — LACTATED RINGERS IV SOLN
INTRAVENOUS | Status: DC | PRN
  Administered 2014-12-28: 08:00:00 via INTRAVENOUS

## 2014-12-28 MED ORDER — SODIUM CHLORIDE 0.9 % IV SOLN: INTRAVENOUS | Status: DC

## 2014-12-28 MED ORDER — LIDOCAINE HCL (CARDIAC) 20 MG/ML IV SOLN
INTRAVENOUS | Status: DC | PRN
  Administered 2014-12-28: 50 mg via INTRAVENOUS

## 2014-12-28 MED ORDER — PROPOFOL 10 MG/ML IV BOLUS
INTRAVENOUS | Status: DC | PRN
  Administered 2014-12-28 (×2): 30 mg via INTRAVENOUS
  Administered 2014-12-28: 20 mg via INTRAVENOUS
  Administered 2014-12-28: 30 mg via INTRAVENOUS
  Administered 2014-12-28 (×3): 20 mg via INTRAVENOUS
  Administered 2014-12-28: 50 mg via INTRAVENOUS

## 2014-12-28 MED ORDER — PROPOFOL 10 MG/ML IV BOLUS
INTRAVENOUS | Status: AC
  Filled 2014-12-28: qty 20

## 2014-12-28 MED ORDER — LIDOCAINE HCL (CARDIAC) 20 MG/ML IV SOLN
INTRAVENOUS | Status: AC
  Filled 2014-12-28: qty 5

## 2014-12-28 NOTE — Interval H&P Note (Signed)
History and Physical Interval Note:  12/28/2014 8:39 AM  Lindsay Doyle  has presented today for surgery, with the diagnosis of Iron Deficiency anemia Heme positive stools  The various methods of treatment have been discussed with the patient and family. After consideration of risks, benefits and other options for treatment, the patient has consented to  Procedure(s): ENTEROSCOPY (N/A) HOT HEMOSTASIS (ARGON PLASMA COAGULATION/BICAP) (N/A) as a surgical intervention .  The patient's history has been reviewed, patient examined, no change in status, stable for surgery.  I have reviewed the patient's chart and labs.  Questions were answered to the patient's satisfaction.     The recent H&P (dated *12/01/14**) was reviewed, the patient was examined and there is no change in the patients condition since that H&P was completed.   Erskine Emery  12/28/2014, 8:39 AM   Erskine Emery

## 2014-12-28 NOTE — Discharge Instructions (Addendum)
Esophagogastrectomy, Care After Refer to this sheet in the next few weeks. These instructions provide you with information on caring for yourself after your procedure. Your caregiver may also give you more specific instructions. Your treatment has been planned according to current medical practices, but problems sometimes occur. Call your caregiver if you have any problems or questions after your procedure. HOME CARE INSTRUCTIONS   Know how to care for your temporary feeding tube. You will be given instructions on how to use it before you leave the hospital. You will have one for about 2-3 weeks. The feeding tube will be removed when you are able to take an oral diet that is sufficient. A home health nurse may come to your home to supervise your use of the tube.  Take all medicines as directed.  Do not use over-the-counter medicines, vitamins, or herbal supplements without your caregiver's approval.  Do not drive while you are using narcotic pain medicines or medicines for nausea.  Follow the diet prescribed by your caregiver.  Follow your caregiver's recommendations for activity level, amount of weight you can lift, and time frame for return to work. These recommendations will depend on the type of surgery you had and your general health.  Care for your surgical cuts (incisions) and bandages (dressings) as directed by your caregiver.  Check with your surgeon before you fly on a plane. Air travel may be allowed 1 week after surgery.  You may shower unless instructed otherwise. Do not scrub the incisions. Pat them dry. The use of your arms overhead to wash your hair may cause fatigue, shortness of breath, or pain. You may need someone to help you.  No baths, hot tubs, or swimming until your caregiver approves.  Check with your surgeon before resuming sexual activity. Sexual activity may be restricted for 4-6 weeks after surgery to allow the incisions to heal adequately.  Keep all follow-up  appointments as directed by your caregiver. SEEK MEDICAL CARE IF:   You have increased pain that does not improve with medicine.  You have trouble swallowing.  You have heartburn or indigestion.  You feel nauseous or vomit.  You cannot have a bowel movement (constipated) or have diarrhea.  You have a rash.  You have burning with urination, or need to urinate more frequently than usual.  You have a new cough. SEEK IMMEDIATE MEDICAL CARE IF:   You have a fever or persistent symptoms for more than 2-3 days.  You have a fever and your symptoms suddenly get worse.  You have chills.  You have chest pain.  You have shortness of breath.  Your dressing on your incision becomes soaked with blood.  Your incision becomes red, opens up, or has a creamy or bad smelling discharge.  You have a severe increase in pain.  You have pain, tenderness, or redness in your calf. Document Released: 09/16/2011 Document Revised: 01/05/2013 Document Reviewed: 09/16/2011 [facility] Patient Information 2015 [place], [place]. This information is not intended to replace advice given to you by your health care provider. Make sure you discuss any questions you have with your health care provider. Esophagogastroduodenoscopy Care After Refer to this sheet in the next few weeks. These instructions provide you with information on caring for yourself after your procedure. Your caregiver may also give you more specific instructions. Your treatment has been planned according to current medical practices, but problems sometimes occur. Call your caregiver if you have any problems or questions after your procedure.  HOME CARE INSTRUCTIONS  Do not eat or drink anything until the numbing medicine (local anesthetic) has worn off and your gag reflex has returned. You will know that the local anesthetic has worn off when you can swallow comfortably.  Do not drive for 12 hours after the procedure or as directed by your  caregiver.  Only take medicines as directed by your caregiver. SEEK MEDICAL CARE IF:   You cannot stop coughing.  You are not urinating at all or less than usual. SEEK IMMEDIATE MEDICAL CARE IF:  You have difficulty swallowing.  You cannot eat or drink.  You have worsening throat or chest pain.  You have dizziness, lightheadedness, or you faint.  You have nausea or vomiting.  You have chills.  You have a fever.  You have severe abdominal pain.  You have black, tarry, or bloody stools. Document Released: 03/03/2012 Document Reviewed: 03/03/2012 [facility] Patient Information 2015 [place]. This information is not intended to replace advice given to you by your health care provider. Make sure you discuss any questions you have with your health care provider.

## 2014-12-28 NOTE — Anesthesia Preprocedure Evaluation (Addendum)
Anesthesia Evaluation  Patient identified by MRN, date of birth, ID band Patient awake    Reviewed: Allergy & Precautions, NPO status , Patient's Chart, lab work & pertinent test results, reviewed documented beta blocker date and time   History of Anesthesia Complications Negative for: history of anesthetic complications  Airway Mallampati: II  TM Distance: <3 FB Neck ROM: Full    Dental  (+) Dental Advisory Given, Partial Upper, Partial Lower, Missing   Pulmonary    Pulmonary exam normal breath sounds clear to auscultation       Cardiovascular hypertension, Pt. on medications and Pt. on home beta blockers + CAD and + Past MI  Normal cardiovascular exam+ dysrhythmias Atrial Fibrillation  Rhythm:Regular Rate:Normal     Neuro/Psych PSYCHIATRIC DISORDERS Anxiety negative neurological ROS     GI/Hepatic Neg liver ROS, GERD  Medicated,  Endo/Other  negative endocrine ROS  Renal/GU Renal disease     Musculoskeletal  (+) Arthritis , Osteoarthritis,    Abdominal   Peds  Hematology  (+) Blood dyscrasia, anemia ,   Anesthesia Other Findings Day of surgery medications reviewed with the patient.  Reproductive/Obstetrics                            Anesthesia Physical Anesthesia Plan  ASA: III  Anesthesia Plan: MAC   Post-op Pain Management:    Induction: Intravenous  Airway Management Planned: Nasal Cannula  Additional Equipment:   Intra-op Plan:   Post-operative Plan:   Informed Consent: I have reviewed the patients History and Physical, chart, labs and discussed the procedure including the risks, benefits and alternatives for the proposed anesthesia with the patient or authorized representative who has indicated his/her understanding and acceptance.   Dental advisory given  Plan Discussed with: CRNA and Anesthesiologist  Anesthesia Plan Comments: (Discussed  risks/benefits/alternatives to MAC sedation including need for ventilatory support, hypotension, need for conversion to general anesthesia.  All patient questions answered.  Patient wished to proceed.)        Anesthesia Quick Evaluation

## 2014-12-28 NOTE — Op Note (Signed)
[facility] [place], [address]   ENTEROSCOPY PROCEDURE REPORT     EXAM DATE: 12/28/2014  PATIENT NAME:      Lindsay Doyle, Lindsay Doyle           MR #:      [id number]  BIRTHDATE:       [birth year]      VISIT #:     [id number] ATTENDING:     Inda Castle, MD     STATUS:     outpatient ASSISTANT:      Susa Day, and Monday, Crawford Givens MD: ASA CLASS:        Class II  INDICATIONS:  The patient is a 81 yr old female here for an enteroscopy procedure due to hemoccult positive stools. PROCEDURE PERFORMED:     Diagnostic small bowel enteroscopy  MEDICATIONS:     Monitored anesthesia care  CONSENT: The patient understands the risks and benefits of the procedure and understands that these risks include, but are not limited to: sedation, allergic reaction, infection, perforation and/or bleeding. Alternative means of evaluation and treatment include, among others: physical exam, x-rays, and/or surgical intervention. The patient elects to proceed with this endoscopic procedure.  DESCRIPTION OF PROCEDURE: During intra-op preparation period all mechanical & medical equipment was checked for proper function. Hand hygiene and appropriate measures for infection prevention was taken. After the risks, benefits and alternatives of the procedure were thoroughly explained, Informed consent was verified, confirmed and timeout was successfully executed by the treatment team. The Pentax VSB-2900 endoscope was introduced through the mouth and advanced to the proximal jejunum jejunum. The prep was   . The instrument was then slowly withdrawn while examining the mucosa circumferentially. The scope was then completely withdrawn from the patient and the procedure terminated. The pulse, BP, and O2 saturation were monitored and documented by the physician and the nursing staff throughout the entire procedure.  The patient was cared for as planned  according to standard protocol, then discharged to recovery in stable condition and with appropriate post procedure care. Estimated blood loss is zero unless otherwise noted in this procedure report.    ESOPHAGUS: The esophagus and gastroesophageal junction were completely normal in appearance.  DUODENUM: The duodenal bulb was normal in appearance, as was the postbulbar duodenum.  STOMACH: The stomach was entered and closely examined.  the antrum, angularis, and lesser curvature were well visualized, including a retroflexed view of the cardia and fundus.  The stomach wall was normally distensable.  The scope passed easily through the pylorus into the duodenum.  JEJUNUM: The exam showed no abnormalities in the jejunum.    ADVERSE EVENTS:      There were no immediate complications.  IMPRESSIONS:     1.  Normal appearing esophagus and GE junction 2.  Normal appearing duodenum 3.  The stomach was well visualized and normal in appearance 4.  The exam showed no abnormalities in the jejunum  RECOMMENDATIONS:     Continue Fe supplementation RECALL:  _____________________________ Inda Castle, MD eSigned:  Inda Castle, MD 12/28/2014 9:36 AM   cc:     PATIENT NAME:  Lindsay Doyle, Lindsay Doyle MR#: [id number]

## 2014-12-28 NOTE — H&P (View-Only) (Signed)
Patient ID: Lindsay Doyle, female   DOB: 11/20/1933, 81 y.o.   MRN: [id number]     History of Present Illness: Lindsay Doyle is a 81 year old African-American female who is known to Dr. Deatra Ina from prior evaluation for anemia. She was last evaluated in the office in June 2015 with a history of having passed 3 dark semi-formed stools. She was not taking gastric irritants except for 81 mg of aspirin. She had a similar presentation in 2012 and required blood transfusions. Upper and lower endoscopy and capsule endoscopy were not diagnostic for a GI bleeding source. After her last visit she was scheduled for a capsule endoscopy and was noted to have gastric AVMs. Her small bowel AVMs were cauterized in July 2015.  She has a history of paroxysmal atrial fibrillation. She was admitted in January 2014 with an NSTEMI in the setting of A. fib with RVR. She spontaneously converted to anasarca. LHC demonstrated nonobstructive disease and she was felt to have had demand ischemia. Eliquis 2.5 mg twice daily was added due to her stroke risk. Echo in February 2014: EF 60-65%. She was admitted in June 2014 his anemia and stools were guaiac positive she was transfused and her eliquis  was discontinued due to her GI bleeding. She decided not to restart anticoagulation. She was seen in the emergency room 11/14/2014 with atrial fibrillation and RVR. She went back into normal sinus rhythm with diltiazem IV. Her hemoglobin was noted to be 7.4. She reports that her energy level has been left than normal. She states her stools are typically dark from iron but feels that for several days they were darker than normal.    Past Medical History  Diagnosis Date  . Hyperlipidemia   . Mitral valve prolapse     Remotely - last echo 2011 did not show this  . ANEMIA-IRON DEFICIENCY     presumed GI bleed 08/2012- anticoag stopped  . LEUKOPENIA, MILD 05/30/2008  . Hypertension 11/12/2006  . COPD 03/09/2007    ? pt is unsure  .  OSTEOARTHRITIS 02/19/2009    Spinal  . OSTEOPOROSIS 11/12/2006  . COLONIC POLYPS, HX OF 03/09/2007  . HYPERGLYCEMIA 07/02/2007  . PSVT     a. Remotely, in setting of anemia.  [name] Positive H. pylori test 10/1996  . Cataract   . Blood transfusion     a. 05/2010: 3 transfusions for anemia/GIB.  [name] GI bleed     a. 05/2010: Hgb 7 at outside hospital s/p 3 u PRBC, EGD 05/2010 mild gastritis, small hiatal hernia, Schatzki ring, negative colonoscopy; capsule endoscopy 06/2010 - possible nonspecific inflammatory changes.;  b.  08/2012: 1/2 guiac +; required transfusion - Apixaban d/c'd  . Hx of echocardiogram     a. echo 2/11:  EF 55-60%, mild AI, mild RAE, mild to mod TR;   b.  a. Echo 2/14:  Mild focal basal and mild LVH, EF 60-65%, mild AI, mild RAE, trivia effusion  . NSTEMI (non-ST elevated myocardial infarction) 03/2012    a. Type II in setting of AF with RVR 03/2012  . Atrial fibrillation     a. Apixaban started 03/2012 - stopped after admx for GI bleed 6/14  . CAD (coronary artery disease)     a. LHC 1/14:  mLAD serial 30 and 60%, oRCA 30%, EF 60%  . Spinal stenosis of lumbar region 07/16/2012  . Asthma     Past Surgical History  Procedure Laterality Date  . Abdominal hysterectomy    .  Tonsillectomy    . Tubal ligation    . Cataract extraction    . Cardiac catheterization  1992    S/P in 1992 at [facility] in [place]. This was negative for any coronary artery disease  . Carotid duplex  08/29/2003  . Ceasarean    . Enteroscopy N/A 10/10/2013    Procedure: ENTEROSCOPY;  Surgeon: Inda Castle, MD;  Location: [facility] ENDOSCOPY;  Service: Endoscopy;  Laterality: N/A;  . Left heart catheterization with coronary angiogram N/A 04/23/2012    Procedure: LEFT HEART CATHETERIZATION WITH CORONARY ANGIOGRAM;  Surgeon: Larey Dresser, MD;  Location: [facility] CATH LAB;  Service: Cardiovascular;  Laterality: N/A;   Family History  Problem Relation Age of Onset  . Stomach cancer Mother   .  Sarcoidosis Sister   . Colon cancer Brother   . Sarcoidosis Other   . CAD Neg Hx    Social History  Substance Use Topics  . Smoking status: Never Smoker   . Smokeless tobacco: Never Used  . Alcohol Use: No   Current Outpatient Prescriptions  Medication Sig Dispense Refill  . aspirin EC 81 MG tablet Take 1 tablet (81 mg total) by mouth daily.    [name] atorvastatin (LIPITOR) 20 MG tablet TAKE 1 TABLET (20 MG TOTAL) BY MOUTH DAILY. 90 tablet 1  . b complex vitamins tablet Take 1 tablet by mouth 2 (two) times a week.     . ferrous sulfate 325 (65 FE) MG tablet Take 325 mg by mouth 2 (two) times daily with a meal.     . metoprolol succinate (TOPROL-XL) 50 MG 24 hr tablet TAKE 1 TABLET (50 MG TOTAL) BY MOUTH DAILY. 90 tablet 1  . pantoprazole (PROTONIX) 40 MG tablet Take 1 tablet (40 mg total) by mouth daily. 30 tablet 0   No current facility-administered medications for this visit.   Allergies  Allergen Reactions  . Doxycycline Diarrhea    Possible diarrhea  . Biaxin [Clarithromycin] Other (See Comments)    diarrhea  . Metronidazole Other (See Comments)    Pt had difficulty swallowing this and says it was "horrible" to take. No allergic reaction.     Review of Systems: Gen: Denies any fever, chills, sweats, anorexia,  malaise, weight loss, and sleep disorder CV: Denies chest pain, angina, palpitations, syncope, orthopnea, PND, peripheral edema, and claudication. Resp: Denies dyspnea at rest, dyspnea with exercise, cough, sputum, wheezing, coughing up blood, and pleurisy. GI: Denies vomiting blood, jaundice, and fecal incontinence.   Denies dysphagia or odynophagia. GU : Denies urinary burning, blood in urine, urinary frequency, urinary hesitancy, nocturnal urination, and urinary incontinence. MS: Denies joint pain, limitation of movement, and swelling, stiffness, low back pain, extremity pain. Denies muscle weakness, cramps, atrophy.  Derm: Denies rash, itching, dry skin, hives, moles,  warts, or unhealing ulcers.  Psych: Denies depression, anxiety, memory loss, suicidal ideation, hallucinations, paranoia, and confusion. Heme: Denies bruising, bleeding, and enlarged lymph nodes. Neuro:  Denies any headaches, dizziness, paresthesia Endo:  Denies any problems with DM, thyroid, adrenal  LAB RESULTS:  Recent Labs  11/29/14 1453  WBC 4.9  HGB 7.7 cL*  HCT 23.2 aL*  PLT 250.0     Physical Exam: BP 108/66 mmHg  Pulse 90  Ht 5\' 4"  (1.626 m)  Wt 111 lb 12.8 oz (50.712 kg)  BMI 19.18 kg/m2 General: Pleasant, well developed , African-American female in no acute distress Head: Normocephalic and atraumatic Eyes:  sclerae anicteric, conjunctiva pink  Ears: Normal auditory acuity Lungs: Clear throughout to auscultation Heart: Regular rate and rhythm Abdomen: Soft, non distended, non-tender. No masses, no hepatomegaly. Normal bowel sounds Rectal: Dark stool, heme positive Musculoskeletal: Symmetrical with no gross deformities  Extremities: No edema  Neurological: Alert oriented x 4, grossly nonfocal Psychological:  Alert and cooperative. Normal mood and affect  Assessment and Recommendations: 81 year old female with a history of GI bleed/small bowel AVMs, found to have a recurrent anemia and heme positive stools. Patient was started on proton next yesterday and has been instructed to continue this. She will be scheduled for an EGD with APC if gastric AVMs are again noted.The risks, benefits, and alternatives to endoscopy with possible biopsy and possible dilation were discussed with the patient and they consent to proceed.  The procedure will be scheduled with Dr. Deatra Ina. Further recommendations will be made pending the findings of the above.        Daniesha Driver, Vita Barley PA-C 12/01/2014,

## 2014-12-28 NOTE — Transfer of Care (Signed)
Immediate Anesthesia Transfer of Care Note  Patient: Lindsay Doyle  Procedure(s) Performed: Procedure(s): ENTEROSCOPY (N/A) HOT HEMOSTASIS (ARGON PLASMA COAGULATION/BICAP) (N/A)  Patient Location: PACU  Anesthesia Type:MAC  Level of Consciousness: awake, alert  and oriented  Airway & Oxygen Therapy: Patient Spontanous Breathing and Patient connected to face mask oxygen  Post-op Assessment: Report given to RN and Post -op Vital signs reviewed and stable  Post vital signs: Reviewed and stable  Last Vitals:  Filed Vitals:   12/28/14 0813  BP: 173/93  Pulse: 90  Temp: 36.6 C  Resp: 16    Complications: No apparent anesthesia complications

## 2014-12-28 NOTE — Anesthesia Postprocedure Evaluation (Signed)
  Anesthesia Post-op Note  Patient: Lindsay Doyle  Procedure(s) Performed: Procedure(s) (LRB): ENTEROSCOPY (N/A) HOT HEMOSTASIS (ARGON PLASMA COAGULATION/BICAP) (N/A)  Patient Location: PACU  Anesthesia Type: MAC  Level of Consciousness: awake and alert   Airway and Oxygen Therapy: Patient Spontanous Breathing  Post-op Pain: mild  Post-op Assessment: Post-op Vital signs reviewed, Patient's Cardiovascular Status Stable, Respiratory Function Stable, Patent Airway and No signs of Nausea or vomiting  Last Vitals:  Filed Vitals:   12/28/14 0930  BP: 145/85  Pulse: 88  Temp:   Resp: 18    Post-op Vital Signs: stable   Complications: No apparent anesthesia complications

## 2014-12-29 ENCOUNTER — Telehealth: Payer: Self-pay | Admitting: Gastroenterology

## 2014-12-29 NOTE — Telephone Encounter (Signed)
Patient states the her tongue is black and blue. Throat on L side is sore.  Is requesting a call back today.

## 2014-12-29 NOTE — Telephone Encounter (Signed)
Spoke with the patient who describes slight swelling of her jaw and soreness at the temple area. No pain with chewing that she has noticed, but she is only consuming soft foods. She does see bruising inside her lips. No cuts or abrasions. Discussed supportive care such as cool packs, cool beverages and tylenol. Are there any other suggestions?

## 2015-01-02 ENCOUNTER — Other Ambulatory Visit (INDEPENDENT_AMBULATORY_CARE_PROVIDER_SITE_OTHER): Payer: Medicare Other

## 2015-01-02 ENCOUNTER — Ambulatory Visit (INDEPENDENT_AMBULATORY_CARE_PROVIDER_SITE_OTHER): Payer: Medicare Other | Admitting: Internal Medicine

## 2015-01-02 ENCOUNTER — Encounter: Payer: Self-pay | Admitting: Internal Medicine

## 2015-01-02 VITALS — BP 98/56 | HR 96 | Temp 97.7°F | Ht 64.0 in | Wt 109.0 lb

## 2015-01-02 DIAGNOSIS — E785 Hyperlipidemia, unspecified: Secondary | ICD-10-CM

## 2015-01-02 DIAGNOSIS — D509 Iron deficiency anemia, unspecified: Secondary | ICD-10-CM

## 2015-01-02 DIAGNOSIS — I10 Essential (primary) hypertension: Secondary | ICD-10-CM

## 2015-01-02 LAB — CBC WITH DIFFERENTIAL/PLATELET
BASOS PCT: 0.4 % (ref 0.0–3.0)
Basophils Absolute: 0 10*3/uL (ref 0.0–0.1)
Eosinophils Absolute: 0.1 10*3/uL (ref 0.0–0.7)
Eosinophils Relative: 1.5 % (ref 0.0–5.0)
HCT: 31.6 % — ABNORMAL LOW (ref 36.0–46.0)
HEMOGLOBIN: 10.4 g/dL — AB (ref 12.0–15.0)
Lymphocytes Relative: 18.8 % (ref 12.0–46.0)
Lymphs Abs: 1.4 10*3/uL (ref 0.7–4.0)
MCHC: 32.9 g/dL (ref 30.0–36.0)
MCV: 91.9 fl (ref 78.0–100.0)
MONO ABS: 1 10*3/uL (ref 0.1–1.0)
MONOS PCT: 13.5 % — AB (ref 3.0–12.0)
Neutro Abs: 4.9 10*3/uL (ref 1.4–7.7)
Neutrophils Relative %: 65.8 % (ref 43.0–77.0)
Platelets: 449 10*3/uL — ABNORMAL HIGH (ref 150.0–400.0)
RBC: 3.44 Mil/uL — ABNORMAL LOW (ref 3.87–5.11)
RDW: 15.7 % — AB (ref 11.5–15.5)
WBC: 7.4 10*3/uL (ref 4.0–10.5)

## 2015-01-02 LAB — LIPID PANEL
CHOL/HDL RATIO: 3
CHOLESTEROL: 205 mg/dL — AB (ref 0–200)
HDL: 70.5 mg/dL (ref >39.00)
LDL CALC: 112 mg/dL — AB (ref 0–99)
NonHDL: 134.73
TRIGLYCERIDES: 112 mg/dL (ref 0.0–149.0)
VLDL: 22.4 mg/dL (ref 0.0–40.0)

## 2015-01-02 LAB — IBC PANEL
IRON: 31 ug/dL — AB (ref 42–145)
Saturation Ratios: 10.3 % — ABNORMAL LOW (ref 20.0–50.0)
Transferrin: 214 mg/dL (ref 212.0–360.0)

## 2015-01-02 NOTE — Assessment & Plan Note (Signed)
Severe with recent heme pos stools, but no overt blood loss, neg eval per Dr Kaplan/GI, for f/u labs today and return in 2 mo for repeat

## 2015-01-02 NOTE — Assessment & Plan Note (Signed)
stable overall by history and exam, recent data reviewed with pt, and pt to continue medical treatment as before,  to f/u any worsening symptoms or concerns  

## 2015-01-02 NOTE — Progress Notes (Signed)
Pre visit review using our clinic review tool, if applicable. No additional management support is needed unless otherwise documented below in the visit note. 

## 2015-01-02 NOTE — Progress Notes (Signed)
Subjective:    Patient ID: Lindsay Doyle, female    DOB: 1934-01-17, 81 y.o.   MRN: [id number]  HPI    Here to f/u, upset she went through a small bowel enteroscopy that to her was "botched up" but nothing was found .  Felt the wait to do the test was too long.  Asks for regular cbc f/u here. Denies worsening reflux, abd pain, dysphagia, n/v, bowel change or blood..  Taking the 2 tylenol bid  , wondering if will hurt her kidneys.  Also asking if capsaicin will help with hand DJD pain, though has miminal hand pain.  Doing excercises related to her senior program.  Does not qualify for nsaid due to recent heme pos stool. Only taking toprol, iron, vitamins, and ASA   Pt denies chest pain, increased sob or doe, wheezing, orthopnea, PND, increased LE swelling, palpitations, dizziness or syncope. Denies worsening depressive symptoms, suicidal ideation, or panic; has ongoing anxiety, not increased recently., but quite irritable today  Has not been taking the statin for one month, wants to see if she can do without it BP Readings from Last 3 Encounters:  01/02/15 98/56  12/28/14 164/88  12/08/14 108/60   Past Medical History  Diagnosis Date  . Hyperlipidemia   . Mitral valve prolapse     Remotely - last echo 2011 did not show this  . ANEMIA-IRON DEFICIENCY     presumed GI bleed 08/2012- anticoag stopped  . LEUKOPENIA, MILD 05/30/2008  . Hypertension 11/12/2006  . COPD 03/09/2007    ? pt is unsure  . OSTEOARTHRITIS 02/19/2009    Spinal  . OSTEOPOROSIS 11/12/2006  . COLONIC POLYPS, HX OF 03/09/2007  . HYPERGLYCEMIA 07/02/2007  . PSVT     a. Remotely, in setting of anemia.  [name] Positive H. pylori test 10/1996  . Cataract   . Blood transfusion     a. 05/2010: 3 transfusions for anemia/GIB.  [name] GI bleed     a. 05/2010: Hgb 7 at outside hospital s/p 3 u PRBC, EGD 05/2010 mild gastritis, small hiatal hernia, Schatzki ring, negative colonoscopy; capsule endoscopy 06/2010 - possible nonspecific inflammatory  changes.;  b.  08/2012: 1/2 guiac +; required transfusion - Apixaban d/c'd  . Hx of echocardiogram     a. echo 2/11:  EF 55-60%, mild AI, mild RAE, mild to mod TR;   b.  a. Echo 2/14:  Mild focal basal and mild LVH, EF 60-65%, mild AI, mild RAE, trivia effusion  . Atrial fibrillation ([place])     a. Apixaban started 03/2012 - stopped after admx for GI bleed 6/14  . Spinal stenosis of lumbar region 07/16/2012  . Asthma   . CAD (coronary artery disease)     a. LHC 1/14:  mLAD serial 30 and 60%, oRCA 30%, EF 60%, Afib,   . NSTEMI (non-ST elevated myocardial infarction) ([place]) 03/2012    a. Type II in setting of AF with RVR 03/2012  . Transfusion history     7/15   . GERD (gastroesophageal reflux disease)    Past Surgical History  Procedure Laterality Date  . Abdominal hysterectomy    . Tonsillectomy    . Tubal ligation    . Cataract extraction    . Cardiac catheterization  1992    S/P in 1992 at [facility] in [place]. This was negative for any coronary artery disease  . Carotid duplex  08/29/2003  . Ceasarean    . Enteroscopy  N/A 10/10/2013    Procedure: ENTEROSCOPY;  Surgeon: Inda Castle, MD;  Location: [facility] ENDOSCOPY;  Service: Endoscopy;  Laterality: N/A;  . Left heart catheterization with coronary angiogram N/A 04/23/2012    Procedure: LEFT HEART CATHETERIZATION WITH CORONARY ANGIOGRAM;  Surgeon: Larey Dresser, MD;  Location: [facility] CATH LAB;  Service: Cardiovascular;  Laterality: N/A;  . Enteroscopy N/A 12/28/2014    Procedure: ENTEROSCOPY;  Surgeon: Inda Castle, MD;  Location: [facility] ENDOSCOPY;  Service: Endoscopy;  Laterality: N/A;  . Hot hemostasis N/A 12/28/2014    Procedure: HOT HEMOSTASIS (ARGON PLASMA COAGULATION/BICAP);  Surgeon: Inda Castle, MD;  Location: [facility];  Service: Endoscopy;  Laterality: N/A;    reports that she has never smoked. She has never used smokeless tobacco. She reports that she does not drink alcohol or use illicit drugs. family  history includes Colon cancer in her brother; Sarcoidosis in her other and sister; Stomach cancer in her mother. There is no history of CAD. Allergies  Allergen Reactions  . Doxycycline Diarrhea    Possible diarrhea  . Biaxin [Clarithromycin] Other (See Comments)    diarrhea  . Metronidazole Other (See Comments)    Pt had difficulty swallowing this and says it was "horrible" to take. No allergic reaction.   Current Outpatient Prescriptions on File Prior to Visit  Medication Sig Dispense Refill  . aspirin EC 81 MG tablet Take 81 mg by mouth every morning.     [name] b complex vitamins tablet Take 1 tablet by mouth daily.     . Calcium Carbonate (CALTRATE 600 PO) Take 1 tablet by mouth.    . ferrous sulfate 325 (65 FE) MG tablet Take 325 mg by mouth 2 (two) times daily with a meal.     . metoprolol succinate (TOPROL-XL) 50 MG 24 hr tablet TAKE 1 TABLET (50 MG TOTAL) BY MOUTH DAILY. 90 tablet 1  . Multiple Vitamin (MULTIVITAMIN) tablet Take 1 tablet by mouth daily.    [name] atorvastatin (LIPITOR) 20 MG tablet TAKE 1 TABLET (20 MG TOTAL) BY MOUTH DAILY. (Patient not taking: Reported on 01/02/2015) 90 tablet 1  . pantoprazole (PROTONIX) 40 MG tablet TAKE 1 TABLET (40 MG TOTAL) BY MOUTH DAILY. (Patient not taking: Reported on 01/02/2015) 30 tablet 6   No current facility-administered medications on file prior to visit.      Review of Systems  Constitutional: Negative for unusual diaphoresis or night sweats HENT: Negative for ringing in ear or discharge Eyes: Negative for double vision or worsening visual disturbance.  Respiratory: Negative for choking and stridor.   Gastrointestinal: Negative for vomiting or other signifcant bowel change Genitourinary: Negative for hematuria or change in urine volume.  Musculoskeletal: Negative for other MSK pain or swelling Skin: Negative for color change and worsening wound.  Neurological: Negative for tremors and numbness other than noted    Psychiatric/Behavioral: Negative for decreased concentration or agitation other than above       Objective:   Physical Exam BP 98/56 mmHg  Pulse 96  Temp(Src) 97.7 F (36.5 C) (Oral)  Ht 5\' 4"  (1.626 m)  Wt 109 lb (49.442 kg)  BMI 18.70 kg/m2  SpO2 99% VS noted,  Constitutional: Pt appears in no significant distress HENT: Head: NCAT.  Right Ear: External ear normal.  Left Ear: External ear normal.  Eyes: . Pupils are equal, round, and reactive to light. Conjunctivae and EOM are normal Neck: Normal range of motion. Neck supple.  Cardiovascular: Normal rate and regular rhythm.  Pulmonary/Chest: Effort normal and breath sounds without rales or wheezing.  Abd:  Soft, NT, ND, + BS Neurological: Pt is alert. Not confused , motor grossly intact Skin: Skin is warm. No rash, no LE edema Psychiatric: Pt behavior is normal. No agitation.     Assessment & Plan:

## 2015-01-02 NOTE — Assessment & Plan Note (Signed)
Has been off statin for one month to see if her upper back pains are improved but no luck with this, for lipids today Lab Results  Component Value Date   LDLCALC 71 11/29/2014

## 2015-01-02 NOTE — Patient Instructions (Signed)
OK to take the iron pills at three times daily for now  You should also start colace (OTC) stool softner 100 mg twice per day to avoid constipation from the iron  Remember, the iron will turn the stools dark and even black looking  Please continue all other medications as before, and refills have been done if requested.  Please have the pharmacy call with any other refills you may need.  Please continue your efforts at being more active, low cholesterol diet, and weight control.  Please keep your appointments with your specialists as you may have planned  Please go to the LAB in the Basement (turn left off the elevator) for the tests to be done today  You will be contacted by phone if any changes need to be made immediately.  Otherwise, you will receive a letter about your results with an explanation, but please check with MyChart first.  Please remember to sign up for MyChart if you have not done so, as this will be important to you in the future with finding out test results, communicating by private email, and scheduling acute appointments online when needed.  Please return in 2 months, or sooner if needed

## 2015-01-03 NOTE — Telephone Encounter (Signed)
Agree with recommendations.  

## 2015-01-04 NOTE — Telephone Encounter (Signed)
01/01/15 (late entry) Spoke with the patient in the office. She came by to show [place] what she experienced. She states she understands that there was not anything that could have necessarily been done, but she was upset that it did not get more immediate attention.  Her left lower lip had a healing closed sore on it. Her tongue on the right side had pin point blackened spots. She states she still has a sore throat and points to the left side of her neck. She brought an older man with her to discuss this. Per her request, this is recorded in her chart.

## 2015-01-24 ENCOUNTER — Ambulatory Visit: Payer: Medicare Other | Admitting: Gastroenterology

## 2015-01-25 ENCOUNTER — Encounter: Payer: Self-pay | Admitting: Internal Medicine

## 2015-01-25 ENCOUNTER — Ambulatory Visit (INDEPENDENT_AMBULATORY_CARE_PROVIDER_SITE_OTHER): Payer: Medicare Other | Admitting: Internal Medicine

## 2015-01-25 VITALS — BP 118/66 | HR 88 | Temp 98.0°F | Ht 64.0 in | Wt 110.0 lb

## 2015-01-25 DIAGNOSIS — J449 Chronic obstructive pulmonary disease, unspecified: Secondary | ICD-10-CM | POA: Diagnosis not present

## 2015-01-25 DIAGNOSIS — I10 Essential (primary) hypertension: Secondary | ICD-10-CM

## 2015-01-25 DIAGNOSIS — M797 Fibromyalgia: Secondary | ICD-10-CM | POA: Diagnosis not present

## 2015-01-25 NOTE — Progress Notes (Signed)
Subjective:    Patient ID: Lindsay Doyle, female    DOB: April 29, 1933, 81 y.o.   MRN: [id number]  HPI  Here to f/u "body pain", described as constant mild to occas severe soreness not localized to any joint,  Just whole body hurts.  Changing mattress did not help.  Has not tried statin.  Better to sleep on her side with less pain in the am, but sleeping on her back leads to more pain.  Takes tylenol 2 in the am and pm.  Plans to re-start yoga.  Pt denies chest pain, increased sob or doe, wheezing, orthopnea, PND, increased LE swelling, palpitations, dizziness or syncope.  Pain overall debilitating, new for her. Wants to start a one a day vitamin,  Not too excited about prescriptoin med tx since she hears so many side effects on TV. No fever, Pt denies chest pain, increased sob or doe, wheezing, orthopnea, PND, increased LE swelling, palpitations, dizziness or syncope. Past Medical History  Diagnosis Date  . Hyperlipidemia   . Mitral valve prolapse     Remotely - last echo 2011 did not show this  . ANEMIA-IRON DEFICIENCY     presumed GI bleed 08/2012- anticoag stopped  . LEUKOPENIA, MILD 05/30/2008  . Hypertension 11/12/2006  . COPD 03/09/2007    ? pt is unsure  . OSTEOARTHRITIS 02/19/2009    Spinal  . OSTEOPOROSIS 11/12/2006  . COLONIC POLYPS, HX OF 03/09/2007  . HYPERGLYCEMIA 07/02/2007  . PSVT     a. Remotely, in setting of anemia.  [name] Positive H. pylori test 10/1996  . Cataract   . Blood transfusion     a. 05/2010: 3 transfusions for anemia/GIB.  [name] GI bleed     a. 05/2010: Hgb 7 at outside hospital s/p 3 u PRBC, EGD 05/2010 mild gastritis, small hiatal hernia, Schatzki ring, negative colonoscopy; capsule endoscopy 06/2010 - possible nonspecific inflammatory changes.;  b.  08/2012: 1/2 guiac +; required transfusion - Apixaban d/c'd  . Hx of echocardiogram     a. echo 2/11:  EF 55-60%, mild AI, mild RAE, mild to mod TR;   b.  a. Echo 2/14:  Mild focal basal and mild LVH, EF 60-65%, mild AI, mild  RAE, trivia effusion  . Atrial fibrillation ([place])     a. Apixaban started 03/2012 - stopped after admx for GI bleed 6/14  . Spinal stenosis of lumbar region 07/16/2012  . Asthma   . CAD (coronary artery disease)     a. LHC 1/14:  mLAD serial 30 and 60%, oRCA 30%, EF 60%, Afib,   . NSTEMI (non-ST elevated myocardial infarction) ([place]) 03/2012    a. Type II in setting of AF with RVR 03/2012  . Transfusion history     7/15   . GERD (gastroesophageal reflux disease)    Past Surgical History  Procedure Laterality Date  . Abdominal hysterectomy    . Tonsillectomy    . Tubal ligation    . Cataract extraction    . Cardiac catheterization  1992    S/P in 1992 at [facility] in [place]. This was negative for any coronary artery disease  . Carotid duplex  08/29/2003  . Ceasarean    . Enteroscopy N/A 10/10/2013    Procedure: ENTEROSCOPY;  Surgeon: Inda Castle, MD;  Location: [facility] ENDOSCOPY;  Service: Endoscopy;  Laterality: N/A;  . Left heart catheterization with coronary angiogram N/A 04/23/2012    Procedure: LEFT HEART CATHETERIZATION WITH CORONARY ANGIOGRAM;  Surgeon: Larey Dresser, MD;  Location: [facility] CATH LAB;  Service: Cardiovascular;  Laterality: N/A;  . Enteroscopy N/A 12/28/2014    Procedure: ENTEROSCOPY;  Surgeon: Inda Castle, MD;  Location: [facility] ENDOSCOPY;  Service: Endoscopy;  Laterality: N/A;  . Hot hemostasis N/A 12/28/2014    Procedure: HOT HEMOSTASIS (ARGON PLASMA COAGULATION/BICAP);  Surgeon: Inda Castle, MD;  Location: [facility];  Service: Endoscopy;  Laterality: N/A;    reports that she has never smoked. She has never used smokeless tobacco. She reports that she does not drink alcohol or use illicit drugs. family history includes Colon cancer in her brother; Sarcoidosis in her other and sister; Stomach cancer in her mother. There is no history of CAD. Allergies  Allergen Reactions  . Doxycycline Diarrhea    Possible diarrhea  . Biaxin  [Clarithromycin] Other (See Comments)    diarrhea  . Metronidazole Other (See Comments)    Pt had difficulty swallowing this and says it was "horrible" to take. No allergic reaction.   Current Outpatient Prescriptions on File Prior to Visit  Medication Sig Dispense Refill  . aspirin EC 81 MG tablet Take 81 mg by mouth every morning.     [name] b complex vitamins tablet Take 1 tablet by mouth daily.     . Calcium Carbonate (CALTRATE 600 PO) Take 1 tablet by mouth.    . ferrous sulfate 325 (65 FE) MG tablet Take 325 mg by mouth 2 (two) times daily with a meal.     . metoprolol succinate (TOPROL-XL) 50 MG 24 hr tablet TAKE 1 TABLET (50 MG TOTAL) BY MOUTH DAILY. 90 tablet 1  . Multiple Vitamin (MULTIVITAMIN) tablet Take 1 tablet by mouth daily.    [name] atorvastatin (LIPITOR) 20 MG tablet TAKE 1 TABLET (20 MG TOTAL) BY MOUTH DAILY. (Patient not taking: Reported on 01/02/2015) 90 tablet 1  . pantoprazole (PROTONIX) 40 MG tablet TAKE 1 TABLET (40 MG TOTAL) BY MOUTH DAILY. (Patient not taking: Reported on 01/02/2015) 30 tablet 6   No current facility-administered medications on file prior to visit.      Review of Systems  Constitutional: Negative for unusual diaphoresis or night sweats HENT: Negative for ringing in ear or discharge Eyes: Negative for double vision or worsening visual disturbance.  Respiratory: Negative for choking and stridor.   Gastrointestinal: Negative for vomiting or other signifcant bowel change Genitourinary: Negative for hematuria or change in urine volume.  Musculoskeletal: Negative for other MSK pain or swelling Skin: Negative for color change and worsening wound.  Neurological: Negative for tremors and numbness other than noted  Psychiatric/Behavioral: Negative for decreased concentration or agitation other than above        Objective:   Physical Exam BP 118/66 mmHg  Pulse 88  Temp(Src) 98 F (36.7 C) (Oral)  Ht 5\' 4"  (1.626 m)  Wt 110 lb (49.896 kg)  BMI 18.87  kg/m2  SpO2 99% VS noted,  Constitutional: Pt appears in no significant distress HENT: Head: NCAT.  Right Ear: External ear normal.  Left Ear: External ear normal.  Eyes: . Pupils are equal, round, and reactive to light. Conjunctivae and EOM are normal Neck: Normal range of motion. Neck supple.  Cardiovascular: Normal rate and regular rhythm.   Pulmonary/Chest: Effort normal and breath sounds without rales or wheezing.  Abd:  Soft, NT, ND, + BS Neurological: Pt is alert. Not confused , motor grossly intact Skin: Skin is warm. No rash, no LE edema Psychiatric: Pt behavior is normal. No  agitation.  Has mult tender areas to the bilat upper back/trapezoid, and lower lumbar paraverteberal No spine tenderness    Assessment & Plan:

## 2015-01-25 NOTE — Assessment & Plan Note (Signed)
D/w pt dx, natural history, and tx options, and she will consider taking cymbalta but wants to research the med first, Pt to call if wants this tx.

## 2015-01-25 NOTE — Patient Instructions (Signed)
Please consider Cymbalta 30 mg (low dose) for the pain  (just call to let me know if you would want this) . Please continue all other medications as before, and refills have been done if requested.  Please have the pharmacy call with any other refills you may need  Please keep your appointments with your specialists as you may have planned

## 2015-01-25 NOTE — Assessment & Plan Note (Signed)
stable overall by history and exam, recent data reviewed with pt, and pt to continue medical treatment as before,  to f/u any worsening symptoms or concerns SpO2 Readings from Last 3 Encounters:  01/25/15 99%  01/02/15 99%  12/28/14 100%

## 2015-01-25 NOTE — Assessment & Plan Note (Signed)
stable overall by history and exam, recent data reviewed with pt, and pt to continue medical treatment as before,  to f/u any worsening symptoms or concerns BP Readings from Last 3 Encounters:  01/25/15 118/66  01/02/15 98/56  12/28/14 164/88

## 2015-01-25 NOTE — Progress Notes (Signed)
Pre visit review using our clinic review tool, if applicable. No additional management support is needed unless otherwise documented below in the visit note. 

## 2015-01-30 ENCOUNTER — Ambulatory Visit: Payer: Medicare Other | Admitting: Internal Medicine

## 2015-02-01 ENCOUNTER — Ambulatory Visit (INDEPENDENT_AMBULATORY_CARE_PROVIDER_SITE_OTHER): Payer: Medicare Other | Admitting: Cardiology

## 2015-02-01 ENCOUNTER — Encounter: Payer: Self-pay | Admitting: Cardiology

## 2015-02-01 VITALS — BP 120/78 | HR 90 | Ht 64.0 in | Wt 110.4 lb

## 2015-02-01 DIAGNOSIS — I480 Paroxysmal atrial fibrillation: Secondary | ICD-10-CM | POA: Diagnosis not present

## 2015-02-01 DIAGNOSIS — I2510 Atherosclerotic heart disease of native coronary artery without angina pectoris: Secondary | ICD-10-CM | POA: Diagnosis not present

## 2015-02-01 DIAGNOSIS — I4891 Unspecified atrial fibrillation: Secondary | ICD-10-CM | POA: Diagnosis not present

## 2015-02-01 LAB — CBC WITH DIFFERENTIAL/PLATELET
Basophils Absolute: 0.1 10*3/uL (ref 0.0–0.1)
Basophils Relative: 1 % (ref 0–1)
EOS PCT: 5 % (ref 0–5)
Eosinophils Absolute: 0.4 10*3/uL (ref 0.0–0.7)
HEMATOCRIT: 29.7 % — AB (ref 36.0–46.0)
Hemoglobin: 9.5 g/dL — ABNORMAL LOW (ref 12.0–15.0)
LYMPHS ABS: 2.7 10*3/uL (ref 0.7–4.0)
LYMPHS PCT: 34 % (ref 12–46)
MCH: 28.9 pg (ref 26.0–34.0)
MCHC: 32 g/dL (ref 30.0–36.0)
MCV: 90.3 fL (ref 78.0–100.0)
MONO ABS: 1.1 10*3/uL — AB (ref 0.1–1.0)
MPV: 8.6 fL (ref 8.6–12.4)
Monocytes Relative: 14 % — ABNORMAL HIGH (ref 3–12)
Neutro Abs: 3.6 10*3/uL (ref 1.7–7.7)
Neutrophils Relative %: 46 % (ref 43–77)
Platelets: 440 10*3/uL — ABNORMAL HIGH (ref 150–400)
RBC: 3.29 MIL/uL — ABNORMAL LOW (ref 3.87–5.11)
RDW: 15.9 % — AB (ref 11.5–15.5)
WBC: 7.8 10*3/uL (ref 4.0–10.5)

## 2015-02-01 NOTE — Patient Instructions (Signed)
Medication Instructions:  No changes today  Labwork: CBCd today  Testing/Procedures: You have been referred to Dr Rayann Heman to discuss procedure for a Watchman Device.   Follow-Up: Your physician wants you to follow-up in: 6 months with Dr [name]. (May 2017).  You will receive a reminder letter in the mail two months in advance. If you don't receive a letter, please call our office to schedule the follow-up appointment.        If you need a refill on your cardiac medications before your next appointment, please call your pharmacy.

## 2015-02-02 DIAGNOSIS — I4891 Unspecified atrial fibrillation: Secondary | ICD-10-CM | POA: Insufficient documentation

## 2015-02-02 NOTE — Progress Notes (Signed)
Patient ID: Lindsay Doyle, female   DOB: February 29, 1934, 81 y.o.   MRN: [id number] PCP: Dr. Jenny Reichmann  81 yo with history of paroxysmal atrial fibrillation presents for cardiology followup.  She was admitted in 1/14 with NSTEMI in the setting of AFib with RVR. She spontaneously converted to NSR. LHC demonstrated non-obstructive disease and she was felt to have had demand ischemia. Eliquis 2.5 mg bid was added due to stroke risk. Echo 05/20/12: EF 60-65%.  Lindsay Doyle was admitted in 6/14 to [facility] with anemia.  1/2 stool guaiacs positive.  She required transfusion. Eliquis was stopped due to concern that it triggered GI bleeding.  No scopes were done. She saw [name] back in followup and had a long discussion about anticoagulation.  Ultimately, she decided not to restart anticoagulation.    She had enteroscopy with cauterization of small bowel AVMs in 7/15.  After that, she was admitted with AKI thought possibly to be due to volume depletion.    She was seen in the ER 11/14/14 with atrial fibrillation and RVR.  She went back into NSR with diltiazem IV.  This was the first time she had felt her heart race in > 6 months.  In the ER, her hemoglobin was 7.4. In 9/16, she had EGD and enteroscopy.  This did not show a definite abnormality. No recent tachypalpitations. She [facility] senior center three times a week.  No dyspnea.  No chest pain. She had some muscle/joint pains and tried stopping statin.  This did not help at all.    Labs (3/14): K 4.2, creatinine 1.0, LDL 84, HDL 82 Labs (10/14): K 3.9, creatinine 0.9, HCT 39.6, TSH normal, LDL 95, HDL 88 Labs (4/15): K 5.1, creatinine 0.9, LDL 95, HDL 71 Labs (9/15): K 3.7, creatinine 1.84 Labs (11/15): creatinine 1.06 Labs (8/16): Na 129, K 4.5, creatinine 0.97, hemoglobin 7.4 Labs (10/16): hgb 10.4, , LDL 112 (statin restarted)  ECG: NSR, normal  PMH: 1. Hyperlipidemia 2. Anemia: Fe deficiency.  Presumed GI bleed requiring  transfusion in 6/14, anticoagulation stopped.  3. HTN: Tolerates HCTZ poorly. 4. Osteoarthritis 5. H/o colonic polyps 6. Osteoporosis 7. GI bleed (3/12, 6/14): 3 units PRBCs, EGD with mild gastritis, colonoscopy negative, capsule endoscopy with nonspecific inflammatory changes.  6/14 GI bleeding (1/2 guaiac +), required transfusion. Enteroscopy 7/15 showed small bowel AVMs that were cauterized.  9/16: EGD and enteroscopy did not show a significant abnormality.  8. CAD: LHC (1/14) with serial 30% and 60% LAD stenoses, 30% ostial RCA, EF 60%.  9. Paroxysmal atrial fibrillation.  Echo (2/14) with EF 60-65%, mild focal basal septal hypertrophy, mild AI. 10. CKD  SH: Married, lives with husband in [place].  Has children living out of town.  Nonsmoker.  Retired.   FH: CAD  ROS: All systems reviewed and negative except as per HPI.   Current Outpatient Prescriptions  Medication Sig Dispense Refill  . aspirin EC 81 MG tablet Take 81 mg by mouth every morning.     [name] atorvastatin (LIPITOR) 20 MG tablet TAKE 1 TABLET (20 MG TOTAL) BY MOUTH DAILY. 90 tablet 1  . ferrous sulfate 325 (65 FE) MG tablet Take 325 mg by mouth 2 (two) times daily with a meal.     . metoprolol succinate (TOPROL-XL) 50 MG 24 hr tablet TAKE 1 TABLET (50 MG TOTAL) BY MOUTH DAILY. 90 tablet 1  . Multiple Vitamin (MULTIVITAMIN) tablet Take 1 tablet by mouth daily.  No current facility-administered medications for this visit.    BP 120/78 mmHg  Pulse 90  Ht 5\' 4"  (1.626 m)  Wt 110 lb 6.4 oz (50.077 kg)  BMI 18.94 kg/m2 General: NAD Neck: No JVD, no thyromegaly or thyroid nodule.  Lungs: Clear to auscultation bilaterally with normal respiratory effort. CV: Nondisplaced PMI.  Heart regular S1/S2, no S3/S4, no murmur.  No peripheral edema.  No carotid bruit.  Normal pedal pulses.  Abdomen: Soft, nontender, no hepatosplenomegaly, no distention.  Neurologic: Alert and oriented x 3.  Psych: Normal affect. Extremities: No  clubbing or cyanosis.   Assessment/Plan:  1. Paroxysmal atrial fibrillation: CHADSVASC 4.  No further palpitations for about 2 months.  She is in NSR today.  Continue Toprol XL.  She has not been anticoagulated due to GI bleeding.  Watchman may be an option for her though she would have to be on coumadin for at least a few weeks after implant. I will refer her to Thompson Grayer to discuss Watchman.  2. CAD: Nonobstructive.  No chest pain.  Suspect elevated troponin in 1/14 episode was due to demand ischemia from atrial fibrillation with RVR.  Continue statin and ASA 81.  Check lipids/LFTs in 2 months.   3. HTN: BP stable.  .   4. GI bleeding: Has history of small bowel AVMs that have been cauterized.  Most recent EGD/colonoscopy did not show any lesions.    Followup in 6 months.   Loralie Champagne 02/02/2015

## 2015-03-06 ENCOUNTER — Encounter: Payer: Self-pay | Admitting: Internal Medicine

## 2015-03-06 ENCOUNTER — Ambulatory Visit (INDEPENDENT_AMBULATORY_CARE_PROVIDER_SITE_OTHER): Payer: Medicare Other | Admitting: Internal Medicine

## 2015-03-06 ENCOUNTER — Other Ambulatory Visit (INDEPENDENT_AMBULATORY_CARE_PROVIDER_SITE_OTHER): Payer: Medicare Other

## 2015-03-06 VITALS — BP 92/60 | Temp 97.7°F | Wt 109.0 lb

## 2015-03-06 DIAGNOSIS — D509 Iron deficiency anemia, unspecified: Secondary | ICD-10-CM

## 2015-03-06 DIAGNOSIS — M797 Fibromyalgia: Secondary | ICD-10-CM

## 2015-03-06 DIAGNOSIS — I2510 Atherosclerotic heart disease of native coronary artery without angina pectoris: Secondary | ICD-10-CM

## 2015-03-06 DIAGNOSIS — E785 Hyperlipidemia, unspecified: Secondary | ICD-10-CM

## 2015-03-06 DIAGNOSIS — Z23 Encounter for immunization: Secondary | ICD-10-CM

## 2015-03-06 LAB — CBC WITH DIFFERENTIAL/PLATELET
BASOS PCT: 0.5 % (ref 0.0–3.0)
Basophils Absolute: 0 10*3/uL (ref 0.0–0.1)
EOS ABS: 0.2 10*3/uL (ref 0.0–0.7)
EOS PCT: 2.7 % (ref 0.0–5.0)
HEMATOCRIT: 31.8 % — AB (ref 36.0–46.0)
HEMOGLOBIN: 10.2 g/dL — AB (ref 12.0–15.0)
LYMPHS PCT: 22.3 % (ref 12.0–46.0)
Lymphs Abs: 1.7 10*3/uL (ref 0.7–4.0)
MCHC: 32 g/dL (ref 30.0–36.0)
MCV: 90.2 fl (ref 78.0–100.0)
Monocytes Absolute: 1.1 10*3/uL — ABNORMAL HIGH (ref 0.1–1.0)
Monocytes Relative: 14.4 % — ABNORMAL HIGH (ref 3.0–12.0)
Neutro Abs: 4.6 10*3/uL (ref 1.4–7.7)
Neutrophils Relative %: 60.1 % (ref 43.0–77.0)
Platelets: 405 10*3/uL — ABNORMAL HIGH (ref 150.0–400.0)
RBC: 3.52 Mil/uL — AB (ref 3.87–5.11)
RDW: 18 % — AB (ref 11.5–15.5)
WBC: 7.6 10*3/uL (ref 4.0–10.5)

## 2015-03-06 LAB — URINALYSIS, ROUTINE W REFLEX MICROSCOPIC
Bilirubin Urine: NEGATIVE
Ketones, ur: NEGATIVE
LEUKOCYTES UA: NEGATIVE
Nitrite: NEGATIVE
PH: 6.5 (ref 5.0–8.0)
TOTAL PROTEIN, URINE-UPE24: NEGATIVE
URINE GLUCOSE: NEGATIVE
Urobilinogen, UA: 0.2 (ref 0.0–1.0)
WBC, UA: NONE SEEN (ref 0–?)

## 2015-03-06 LAB — BASIC METABOLIC PANEL
BUN: 15 mg/dL (ref 6–23)
CO2: 29 mEq/L (ref 19–32)
CREATININE: 0.95 mg/dL (ref 0.40–1.20)
Calcium: 10.4 mg/dL (ref 8.4–10.5)
Chloride: 100 mEq/L (ref 96–112)
GFR: 72.85 mL/min (ref >60.00)
Glucose, Bld: 67 mg/dL — ABNORMAL LOW (ref 70–99)
Potassium: 4.4 mEq/L (ref 3.5–5.1)
Sodium: 140 mEq/L (ref 135–145)

## 2015-03-06 LAB — LIPID PANEL
CHOL/HDL RATIO: 2
Cholesterol: 153 mg/dL (ref 0–200)
HDL: 66.7 mg/dL (ref >39.00)
LDL CALC: 69 mg/dL (ref 0–99)
NonHDL: 86.66
TRIGLYCERIDES: 90 mg/dL (ref 0.0–149.0)
VLDL: 18 mg/dL (ref 0.0–40.0)

## 2015-03-06 LAB — HEPATIC FUNCTION PANEL
ALBUMIN: 4 g/dL (ref 3.5–5.2)
ALT: 12 U/L (ref 0–35)
AST: 16 U/L (ref 0–37)
Alkaline Phosphatase: 72 U/L (ref 39–117)
BILIRUBIN TOTAL: 0.3 mg/dL (ref 0.2–1.2)
Bilirubin, Direct: 0.1 mg/dL (ref 0.0–0.3)
Total Protein: 8.6 g/dL — ABNORMAL HIGH (ref 6.0–8.3)

## 2015-03-06 LAB — IBC PANEL
Iron: 44 ug/dL (ref 42–145)
SATURATION RATIOS: 12.7 % — AB (ref 20.0–50.0)
Transferrin: 248 mg/dL (ref 212.0–360.0)

## 2015-03-06 LAB — TSH: TSH: 1.56 u[IU]/mL (ref 0.35–4.50)

## 2015-03-06 LAB — FERRITIN: FERRITIN: 127.5 ng/mL (ref 10.0–291.0)

## 2015-03-06 NOTE — Progress Notes (Signed)
Pre visit review using our clinic review tool, if applicable. No additional management support is needed unless otherwise documented below in the visit note. 

## 2015-03-06 NOTE — Progress Notes (Signed)
Subjective:    Patient ID: Lindsay Doyle, female    DOB: 1933/11/19, 81 y.o.   MRN: [id number]  HPI  Here to f/u, has seen cardiology, but states today after doing her own research she will not attend and information session regarding new treatment option per Dr [name].  No overt bleeding, but has ongoing iron def anemia, source not ID'd per prior GI evaluation with Dr Deatra Ina, last Hgb approx 1 mo ago with slight worsening again at 9.5 and iron indices d/w iron deficiency.  Has been compliant with taking BID iron.  Now also believes she has FMS after our discussion last visit, and her own internet research, and seeing Dr Deveshwar/rheum, but still declines tx such as cymbalta due to fear of side effects.  Getting by for now mostly with upper back pain with tylenol otc prn.  Since pain did not improve with stopping the statin, she has been back on statin for approx 1 mo. BP borderline low today on toprol, Pt denies chest pain, increased sob or doe, wheezing, orthopnea, PND, increased LE swelling, palpitations, dizziness or syncope.   Pt denies polydipsia, polyuria, or low sugar symptoms such as weakness or confusion improved with po intake.  Pt states overall good compliance with meds Past Medical History  Diagnosis Date  . Hyperlipidemia   . Mitral valve prolapse     Remotely - last echo 2011 did not show this  . ANEMIA-IRON DEFICIENCY     presumed GI bleed 08/2012- anticoag stopped  . LEUKOPENIA, MILD 05/30/2008  . Hypertension 11/12/2006  . COPD 03/09/2007    ? pt is unsure  . OSTEOARTHRITIS 02/19/2009    Spinal  . OSTEOPOROSIS 11/12/2006  . COLONIC POLYPS, HX OF 03/09/2007  . HYPERGLYCEMIA 07/02/2007  . PSVT     a. Remotely, in setting of anemia.  [name] Positive H. pylori test 10/1996  . Cataract   . Blood transfusion     a. 05/2010: 3 transfusions for anemia/GIB.  [name] GI bleed     a. 05/2010: Hgb 7 at outside hospital s/p 3 u PRBC, EGD 05/2010 mild gastritis, small hiatal hernia, Schatzki ring,  negative colonoscopy; capsule endoscopy 06/2010 - possible nonspecific inflammatory changes.;  b.  08/2012: 1/2 guiac +; required transfusion - Apixaban d/c'd  . Hx of echocardiogram     a. echo 2/11:  EF 55-60%, mild AI, mild RAE, mild to mod TR;   b.  a. Echo 2/14:  Mild focal basal and mild LVH, EF 60-65%, mild AI, mild RAE, trivia effusion  . Atrial fibrillation ([place])     a. Apixaban started 03/2012 - stopped after admx for GI bleed 6/14  . Spinal stenosis of lumbar region 07/16/2012  . Asthma   . CAD (coronary artery disease)     a. LHC 1/14:  mLAD serial 30 and 60%, oRCA 30%, EF 60%, Afib,   . NSTEMI (non-ST elevated myocardial infarction) ([place]) 03/2012    a. Type II in setting of AF with RVR 03/2012  . Transfusion history     7/15   . GERD (gastroesophageal reflux disease)    Past Surgical History  Procedure Laterality Date  . Abdominal hysterectomy    . Tonsillectomy    . Tubal ligation    . Cataract extraction    . Cardiac catheterization  1992    S/P in 1992 at [facility] in [place]. This was negative for any coronary artery disease  . Carotid duplex  08/29/2003  . Ceasarean    . Enteroscopy N/A 10/10/2013    Procedure: ENTEROSCOPY;  Surgeon: Inda Castle, MD;  Location: [facility] ENDOSCOPY;  Service: Endoscopy;  Laterality: N/A;  . Left heart catheterization with coronary angiogram N/A 04/23/2012    Procedure: LEFT HEART CATHETERIZATION WITH CORONARY ANGIOGRAM;  Surgeon: Larey Dresser, MD;  Location: [facility] CATH LAB;  Service: Cardiovascular;  Laterality: N/A;  . Enteroscopy N/A 12/28/2014    Procedure: ENTEROSCOPY;  Surgeon: Inda Castle, MD;  Location: [facility];  Service: Endoscopy;  Laterality: N/A;  . Hot hemostasis N/A 12/28/2014    Procedure: HOT HEMOSTASIS (ARGON PLASMA COAGULATION/BICAP);  Surgeon: Inda Castle, MD;  Location: [facility];  Service: Endoscopy;  Laterality: N/A;    reports that she has never smoked. She has never used smokeless  tobacco. She reports that she does not drink alcohol or use illicit drugs. family history includes Colon cancer in her brother; Sarcoidosis in her other and sister; Stomach cancer in her mother. There is no history of CAD. Allergies  Allergen Reactions  . Doxycycline Diarrhea    Possible diarrhea  . Biaxin [Clarithromycin] Other (See Comments)    diarrhea  . Metronidazole Other (See Comments)    Pt had difficulty swallowing this and says it was "horrible" to take. No allergic reaction.   Current Outpatient Prescriptions on File Prior to Visit  Medication Sig Dispense Refill  . aspirin EC 81 MG tablet Take 81 mg by mouth every morning.     [name] atorvastatin (LIPITOR) 20 MG tablet TAKE 1 TABLET (20 MG TOTAL) BY MOUTH DAILY. 90 tablet 1  . ferrous sulfate 325 (65 FE) MG tablet Take 325 mg by mouth 2 (two) times daily with a meal.     . metoprolol succinate (TOPROL-XL) 50 MG 24 hr tablet TAKE 1 TABLET (50 MG TOTAL) BY MOUTH DAILY. 90 tablet 1  . Multiple Vitamin (MULTIVITAMIN) tablet Take 1 tablet by mouth daily.     No current facility-administered medications on file prior to visit.   Review of Systems  Constitutional: Negative for unusual diaphoresis or night sweats HENT: Negative for ringing in ear or discharge Eyes: Negative for double vision or worsening visual disturbance.  Respiratory: Negative for choking and stridor.   Gastrointestinal: Negative for vomiting or other signifcant bowel change Genitourinary: Negative for hematuria or change in urine volume.  Musculoskeletal: Negative for other MSK pain or swelling Skin: Negative for color change and worsening wound.  Neurological: Negative for tremors and numbness other than noted  Psychiatric/Behavioral: Negative for decreased concentration or agitation other than above       Objective:   Physical Exam BP 92/60 mmHg  Temp(Src) 97.7 F (36.5 C)  Wt 109 lb (49.442 kg)  SpO2 98% VS noted, notill appaering Constitutional: Pt  appears in no significant distress HENT: Head: NCAT.  Right Ear: External ear normal.  Left Ear: External ear normal.  Eyes: . Pupils are equal, round, and reactive to light. Conjunctivae and EOM are normal Neck: Normal range of motion. Neck supple.  Cardiovascular: Normal rate and regular rhythm.   Pulmonary/Chest: Effort normal and breath sounds without rales or wheezing.  Abd:  Soft, NT, ND, + BS MSK: tender bilat trapezoid areas, no c-spine tender Neurological: Pt is alert. Not confused , motor grossly intact Skin: Skin is warm. No rash, no LE edema Psychiatric: Pt behavior is normal. No agitation.     Assessment & Plan:

## 2015-03-06 NOTE — Assessment & Plan Note (Signed)
For f/u lab eval, may need iron infusion such as ferreheme at short stay/outpatient,f/u with cbc q 2 mo

## 2015-03-06 NOTE — Patient Instructions (Signed)
You had the flu shot today  Please consider Turmeric, Ginger, glucosamine, and tart cherry OTC for pain, in addition to the tylenol  Please call if you change your mind about taking the cymbalta  OK to continue the iron twice per day  Please continue all other medications as before, and refills have been done if requested.  Please have the pharmacy call with any other refills you may need.  Please continue your efforts at being more active, low cholesterol diet, and weight control..  Please keep your appointments with your specialists as you may have planned  Please go to the LAB in the Basement (turn left off the elevator) for the tests to be done today  You will be contacted by phone if any changes need to be made immediately.  Otherwise, you will receive a letter about your results with an explanation, but please check with MyChart first.  Please remember to sign up for MyChart if you have not done so, as this will be important to you in the future with finding out test results, communicating by private email, and scheduling acute appointments online when needed.  Please return in 2 months, or sooner if needed

## 2015-03-06 NOTE — Assessment & Plan Note (Signed)
Back on statin, trying to follow lower chol diet, for f/u labs,  to f/u any worsening symptoms or concerns

## 2015-03-06 NOTE — Assessment & Plan Note (Signed)
D/w pt, but again declines tx such as cymbalta, cont tylenol prn,  to f/u any worsening symptoms or concerns

## 2015-03-07 ENCOUNTER — Other Ambulatory Visit: Payer: Self-pay | Admitting: Internal Medicine

## 2015-03-07 DIAGNOSIS — R3129 Other microscopic hematuria: Secondary | ICD-10-CM

## 2015-03-09 ENCOUNTER — Telehealth: Payer: Self-pay | Admitting: Internal Medicine

## 2015-03-09 NOTE — Telephone Encounter (Signed)
Patient states that she had a phone call stating she would be referred to a kidney doctor.  Patient states she already has a kidney doctor, Dr. Joelyn Oms.  She is requesting to be sent to him.  States she needs appointment before 22nd.

## 2015-03-09 NOTE — Telephone Encounter (Signed)
Pt was referred to a Urologist not a Nephrologist

## 2015-03-12 ENCOUNTER — Institutional Professional Consult (permissible substitution): Payer: Medicare Other | Admitting: Internal Medicine

## 2015-03-13 ENCOUNTER — Encounter: Payer: Self-pay | Admitting: Internal Medicine

## 2015-03-13 NOTE — Telephone Encounter (Signed)
Pt advised again that she was referred to evaluate and treat moderate amount of blood found in her urine 12/06. Pt given the number to [facility] to discuss appt details

## 2015-03-13 NOTE — Telephone Encounter (Signed)
Pt is wondering why she needs to see a Urologist instead of her Nephrologist. Can you please call her to explain this to her. Her number is [phone]

## 2015-03-26 DIAGNOSIS — I252 Old myocardial infarction: Secondary | ICD-10-CM | POA: Diagnosis not present

## 2015-03-26 DIAGNOSIS — Z951 Presence of aortocoronary bypass graft: Secondary | ICD-10-CM | POA: Diagnosis not present

## 2015-03-26 DIAGNOSIS — S1086XA Insect bite of other specified part of neck, initial encounter: Secondary | ICD-10-CM | POA: Diagnosis not present

## 2015-03-26 DIAGNOSIS — Z880 Allergy status to penicillin: Secondary | ICD-10-CM | POA: Diagnosis not present

## 2015-03-26 DIAGNOSIS — J449 Chronic obstructive pulmonary disease, unspecified: Secondary | ICD-10-CM | POA: Diagnosis not present

## 2015-03-26 DIAGNOSIS — Z90710 Acquired absence of both cervix and uterus: Secondary | ICD-10-CM | POA: Diagnosis not present

## 2015-03-26 DIAGNOSIS — M129 Arthropathy, unspecified: Secondary | ICD-10-CM | POA: Diagnosis not present

## 2015-03-26 DIAGNOSIS — S1096XA Insect bite of unspecified part of neck, initial encounter: Secondary | ICD-10-CM | POA: Diagnosis not present

## 2015-03-26 DIAGNOSIS — I1310 Hypertensive heart and chronic kidney disease without heart failure, with stage 1 through stage 4 chronic kidney disease, or unspecified chronic kidney disease: Secondary | ICD-10-CM | POA: Diagnosis not present

## 2015-03-26 DIAGNOSIS — M810 Age-related osteoporosis without current pathological fracture: Secondary | ICD-10-CM | POA: Diagnosis not present

## 2015-03-26 DIAGNOSIS — Z7982 Long term (current) use of aspirin: Secondary | ICD-10-CM | POA: Diagnosis not present

## 2015-03-26 DIAGNOSIS — I341 Nonrheumatic mitral (valve) prolapse: Secondary | ICD-10-CM | POA: Diagnosis not present

## 2015-03-26 DIAGNOSIS — L282 Other prurigo: Secondary | ICD-10-CM | POA: Diagnosis not present

## 2015-03-26 DIAGNOSIS — I4891 Unspecified atrial fibrillation: Secondary | ICD-10-CM | POA: Diagnosis not present

## 2015-03-26 DIAGNOSIS — Z886 Allergy status to analgesic agent status: Secondary | ICD-10-CM | POA: Diagnosis not present

## 2015-03-26 DIAGNOSIS — N189 Chronic kidney disease, unspecified: Secondary | ICD-10-CM | POA: Diagnosis not present

## 2015-03-26 DIAGNOSIS — W57XXXA Bitten or stung by nonvenomous insect and other nonvenomous arthropods, initial encounter: Secondary | ICD-10-CM | POA: Diagnosis not present

## 2015-03-26 DIAGNOSIS — Z79899 Other long term (current) drug therapy: Secondary | ICD-10-CM | POA: Diagnosis not present

## 2015-03-26 DIAGNOSIS — J45909 Unspecified asthma, uncomplicated: Secondary | ICD-10-CM | POA: Diagnosis not present

## 2015-03-29 ENCOUNTER — Ambulatory Visit (INDEPENDENT_AMBULATORY_CARE_PROVIDER_SITE_OTHER): Payer: Medicare Other | Admitting: Internal Medicine

## 2015-03-29 ENCOUNTER — Encounter: Payer: Self-pay | Admitting: Internal Medicine

## 2015-03-29 VITALS — BP 118/70 | HR 88 | Temp 97.8°F | Ht 64.0 in | Wt 111.0 lb

## 2015-03-29 DIAGNOSIS — I2510 Atherosclerotic heart disease of native coronary artery without angina pectoris: Secondary | ICD-10-CM

## 2015-03-29 DIAGNOSIS — R21 Rash and other nonspecific skin eruption: Secondary | ICD-10-CM | POA: Diagnosis not present

## 2015-03-29 DIAGNOSIS — J4520 Mild intermittent asthma, uncomplicated: Secondary | ICD-10-CM | POA: Diagnosis not present

## 2015-03-29 DIAGNOSIS — I10 Essential (primary) hypertension: Secondary | ICD-10-CM

## 2015-03-29 DIAGNOSIS — R7302 Impaired glucose tolerance (oral): Secondary | ICD-10-CM | POA: Diagnosis not present

## 2015-03-29 MED ORDER — METHYLPREDNISOLONE ACETATE 80 MG/ML IJ SUSP
80.0000 mg | Freq: Once | INTRAMUSCULAR | Status: AC
  Administered 2015-03-29: 80 mg via INTRAMUSCULAR

## 2015-03-29 MED ORDER — HYDROCORTISONE 1 % EX OINT: 1.0000 "application " | TOPICAL_OINTMENT | Freq: Two times a day (BID) | CUTANEOUS | Status: DC | PRN

## 2015-03-29 NOTE — Patient Instructions (Signed)
You had the steroid shot today  Please continue all other medications as before, including the hydrocortisone cream as needed  Please have the pharmacy call with any other refills you may need.  Please keep your appointments with your specialists as you may have planned

## 2015-03-29 NOTE — Progress Notes (Signed)
Subjective:    Patient ID: Lindsay Doyle, female    DOB: 07/07/1933, 81 y.o.   MRN: [id number]  HPI  Here with rash, itchy, similar to hives it seems after stayed out of town, found bedbugs in bed day of onset, mild to mod, started 3 days ago  Now persistent and worsening after lesions started appaering to post mid upper then bilat neck, then submental anterior neck, then yesterday and today with lesions appearing to both lateral upper arms left > right.  Seen at local ED, apparently though to be insect bite related and rx hydrocort ointment 1% topical and benadryl 50 q 6 prn, which has led to some improvement in itchiness but lesions keep worsening.  No other tongue or mouth or other swelling, itching or rash elsewhere.  Pt denies chest pain, increased sob or doe, wheezing, orthopnea, PND, increased LE swelling, palpitations, dizziness or syncope.   Pt denies polydipsia, polyuria Past Medical History  Diagnosis Date  . Hyperlipidemia   . Mitral valve prolapse     Remotely - last echo 2011 did not show this  . ANEMIA-IRON DEFICIENCY     presumed GI bleed 08/2012- anticoag stopped  . LEUKOPENIA, MILD 05/30/2008  . Hypertension 11/12/2006  . COPD 03/09/2007    ? pt is unsure  . OSTEOARTHRITIS 02/19/2009    Spinal  . OSTEOPOROSIS 11/12/2006  . COLONIC POLYPS, HX OF 03/09/2007  . HYPERGLYCEMIA 07/02/2007  . PSVT     a. Remotely, in setting of anemia.  [name] Positive H. pylori test 10/1996  . Cataract   . Blood transfusion     a. 05/2010: 3 transfusions for anemia/GIB.  [name] GI bleed     a. 05/2010: Hgb 7 at outside hospital s/p 3 u PRBC, EGD 05/2010 mild gastritis, small hiatal hernia, Schatzki ring, negative colonoscopy; capsule endoscopy 06/2010 - possible nonspecific inflammatory changes.;  b.  08/2012: 1/2 guiac +; required transfusion - Apixaban d/c'd  . Hx of echocardiogram     a. echo 2/11:  EF 55-60%, mild AI, mild RAE, mild to mod TR;   b.  a. Echo 2/14:  Mild focal basal and mild LVH, EF 60-65%,  mild AI, mild RAE, trivia effusion  . Atrial fibrillation ([place])     a. Apixaban started 03/2012 - stopped after admx for GI bleed 6/14  . Spinal stenosis of lumbar region 07/16/2012  . Asthma   . CAD (coronary artery disease)     a. LHC 1/14:  mLAD serial 30 and 60%, oRCA 30%, EF 60%, Afib,   . NSTEMI (non-ST elevated myocardial infarction) ([place]) 03/2012    a. Type II in setting of AF with RVR 03/2012  . Transfusion history     7/15   . GERD (gastroesophageal reflux disease)    Past Surgical History  Procedure Laterality Date  . Abdominal hysterectomy    . Tonsillectomy    . Tubal ligation    . Cataract extraction    . Cardiac catheterization  1992    S/P in 1992 at [facility] in [place]. This was negative for any coronary artery disease  . Carotid duplex  08/29/2003  . Ceasarean    . Enteroscopy N/A 10/10/2013    Procedure: ENTEROSCOPY;  Surgeon: Inda Castle, MD;  Location: [facility];  Service: Endoscopy;  Laterality: N/A;  . Left heart catheterization with coronary angiogram N/A 04/23/2012    Procedure: LEFT HEART CATHETERIZATION WITH CORONARY ANGIOGRAM;  Surgeon: Elby Showers  [name], MD;  Location: [facility] CATH LAB;  Service: Cardiovascular;  Laterality: N/A;  . Enteroscopy N/A 12/28/2014    Procedure: ENTEROSCOPY;  Surgeon: Inda Castle, MD;  Location: [facility] ENDOSCOPY;  Service: Endoscopy;  Laterality: N/A;  . Hot hemostasis N/A 12/28/2014    Procedure: HOT HEMOSTASIS (ARGON PLASMA COAGULATION/BICAP);  Surgeon: Inda Castle, MD;  Location: [facility];  Service: Endoscopy;  Laterality: N/A;    reports that she has never smoked. She has never used smokeless tobacco. She reports that she does not drink alcohol or use illicit drugs. family history includes Colon cancer in her brother; Sarcoidosis in her other and sister; Stomach cancer in her mother. There is no history of CAD. Allergies  Allergen Reactions  . Doxycycline Diarrhea    Possible diarrhea  . Biaxin  [Clarithromycin] Other (See Comments)    diarrhea  . Metronidazole Other (See Comments)    Pt had difficulty swallowing this and says it was "horrible" to take. No allergic reaction.   Current Outpatient Prescriptions on File Prior to Visit  Medication Sig Dispense Refill  . aspirin EC 81 MG tablet Take 81 mg by mouth every morning.     [name] atorvastatin (LIPITOR) 20 MG tablet TAKE 1 TABLET (20 MG TOTAL) BY MOUTH DAILY. 90 tablet 1  . ferrous sulfate 325 (65 FE) MG tablet Take 325 mg by mouth 2 (two) times daily with a meal.     . metoprolol succinate (TOPROL-XL) 50 MG 24 hr tablet TAKE 1 TABLET (50 MG TOTAL) BY MOUTH DAILY. 90 tablet 1  . Multiple Vitamin (MULTIVITAMIN) tablet Take 1 tablet by mouth daily.     No current facility-administered medications on file prior to visit.   Review of Systems  Constitutional: Negative for unusual diaphoresis or night sweats HENT: Negative for ringing in ear or discharge Eyes: Negative for double vision or worsening visual disturbance.  Respiratory: Negative for choking and stridor.   Gastrointestinal: Negative for vomiting or other signifcant bowel change Genitourinary: Negative for hematuria or change in urine volume.  Musculoskeletal: Negative for other MSK pain or swelling Skin: Negative for color change and worsening wound.  Neurological: Negative for tremors and numbness other than noted  Psychiatric/Behavioral: Negative for decreased concentration or agitation other than above       Objective:   Physical Exam BP 118/70 mmHg  Pulse 88  Temp(Src) 97.8 F (36.6 C) (Oral)  Ht 5\' 4"  (1.626 m)  Wt 111 lb (50.349 kg)  BMI 19.04 kg/m2  SpO2 95% VS noted, not ill appaering but aggrevated Constitutional: Pt appears in no significant distress HENT: Head: NCAT.  Right Ear: External ear normal.  Left Ear: External ear normal.  Eyes: . Pupils are equal, round, and reactive to light. Conjunctivae and EOM are normal Neck: Normal range of motion.  Neck supple.  Cardiovascular: Normal rate and regular rhythm.   Pulmonary/Chest: Effort normal and breath sounds without rales or wheezing.  Neurological: Pt is alert. Not confused , motor grossly intact Skin: Skin is warm. no LE or other edema, + rash with numerous nontender small < 1/2 cm lesions erythem, soft, slightly raised oval to circular, blanching to post neck, anterior submental area, and bilat upper lateral arms, no other lip or tongue swelling Psychiatric: Pt behavior is normal. No agitation.     Assessment & Plan:

## 2015-03-29 NOTE — Progress Notes (Signed)
Pre visit review using our clinic review tool, if applicable. No additional management support is needed unless otherwise documented below in the visit note. 

## 2015-03-30 NOTE — Assessment & Plan Note (Signed)
stable overall by history and exam, recent data reviewed with pt, and pt to continue medical treatment as before,  to f/u any worsening symptoms or concerns Lab Results  Component Value Date   HGBA1C 5.7 07/27/2014

## 2015-03-30 NOTE — Assessment & Plan Note (Signed)
stable overall by history and exam, recent data reviewed with pt, and pt to continue medical treatment as before,  to f/u any worsening symptoms or concerns BP Readings from Last 3 Encounters:  03/29/15 118/70  03/06/15 92/60  02/01/15 120/78

## 2015-03-30 NOTE — Assessment & Plan Note (Signed)
stable overall by history and exam, recent data reviewed with pt, and pt to continue medical treatment as before,  to f/u any worsening symptoms or concerns SpO2 Readings from Last 3 Encounters:  03/29/15 95%  03/06/15 98%  01/25/15 99%

## 2015-03-30 NOTE — Assessment & Plan Note (Signed)
Etiology unclear, but suspected likely did have insect bite irritative lesions to start at post neck, but now overall worsening in extent of lesions last 3 days not well controlled with topical steroid and benadryl. OK for depomedrol IM today, and offered predpac but she declines this, may consider call if not improving with shot.  to f/u any worsening symptoms or concerns

## 2015-04-24 DIAGNOSIS — R3129 Other microscopic hematuria: Secondary | ICD-10-CM | POA: Diagnosis not present

## 2015-04-24 DIAGNOSIS — Z0000 Encounter for general adult medical examination without abnormal findings: Secondary | ICD-10-CM | POA: Diagnosis not present

## 2015-04-26 DIAGNOSIS — Z961 Presence of intraocular lens: Secondary | ICD-10-CM | POA: Diagnosis not present

## 2015-04-26 DIAGNOSIS — H02051 Trichiasis without entropian right upper eyelid: Secondary | ICD-10-CM | POA: Diagnosis not present

## 2015-04-26 DIAGNOSIS — H02054 Trichiasis without entropian left upper eyelid: Secondary | ICD-10-CM | POA: Diagnosis not present

## 2015-04-27 ENCOUNTER — Emergency Department (HOSPITAL_COMMUNITY)
Admission: EM | Admit: 2015-04-27 | Discharge: 2015-04-28 | Disposition: A | Discharge status: Home / Self Care | Payer: Medicare Other | Attending: Emergency Medicine | Admitting: Emergency Medicine

## 2015-04-27 ENCOUNTER — Encounter (HOSPITAL_COMMUNITY): Payer: Self-pay | Admitting: Emergency Medicine

## 2015-04-27 DIAGNOSIS — D509 Iron deficiency anemia, unspecified: Secondary | ICD-10-CM | POA: Diagnosis not present

## 2015-04-27 DIAGNOSIS — I2510 Atherosclerotic heart disease of native coronary artery without angina pectoris: Secondary | ICD-10-CM | POA: Insufficient documentation

## 2015-04-27 DIAGNOSIS — Z8619 Personal history of other infectious and parasitic diseases: Secondary | ICD-10-CM | POA: Insufficient documentation

## 2015-04-27 DIAGNOSIS — Z86010 Personal history of colonic polyps: Secondary | ICD-10-CM | POA: Diagnosis not present

## 2015-04-27 DIAGNOSIS — I10 Essential (primary) hypertension: Secondary | ICD-10-CM | POA: Insufficient documentation

## 2015-04-27 DIAGNOSIS — Z7982 Long term (current) use of aspirin: Secondary | ICD-10-CM | POA: Diagnosis not present

## 2015-04-27 DIAGNOSIS — Z8719 Personal history of other diseases of the digestive system: Secondary | ICD-10-CM | POA: Insufficient documentation

## 2015-04-27 DIAGNOSIS — R531 Weakness: Secondary | ICD-10-CM | POA: Diagnosis not present

## 2015-04-27 DIAGNOSIS — I341 Nonrheumatic mitral (valve) prolapse: Secondary | ICD-10-CM | POA: Diagnosis not present

## 2015-04-27 DIAGNOSIS — Z9849 Cataract extraction status, unspecified eye: Secondary | ICD-10-CM | POA: Diagnosis not present

## 2015-04-27 DIAGNOSIS — R350 Frequency of micturition: Secondary | ICD-10-CM | POA: Diagnosis not present

## 2015-04-27 DIAGNOSIS — I252 Old myocardial infarction: Secondary | ICD-10-CM | POA: Diagnosis not present

## 2015-04-27 DIAGNOSIS — E785 Hyperlipidemia, unspecified: Secondary | ICD-10-CM | POA: Diagnosis not present

## 2015-04-27 DIAGNOSIS — I471 Supraventricular tachycardia: Secondary | ICD-10-CM | POA: Insufficient documentation

## 2015-04-27 DIAGNOSIS — R42 Dizziness and giddiness: Secondary | ICD-10-CM | POA: Diagnosis not present

## 2015-04-27 DIAGNOSIS — R55 Syncope and collapse: Secondary | ICD-10-CM | POA: Insufficient documentation

## 2015-04-27 DIAGNOSIS — I4891 Unspecified atrial fibrillation: Secondary | ICD-10-CM | POA: Diagnosis not present

## 2015-04-27 DIAGNOSIS — J449 Chronic obstructive pulmonary disease, unspecified: Secondary | ICD-10-CM | POA: Diagnosis not present

## 2015-04-27 DIAGNOSIS — M1990 Unspecified osteoarthritis, unspecified site: Secondary | ICD-10-CM | POA: Diagnosis not present

## 2015-04-27 DIAGNOSIS — Z79899 Other long term (current) drug therapy: Secondary | ICD-10-CM | POA: Diagnosis not present

## 2015-04-27 DIAGNOSIS — Z9889 Other specified postprocedural states: Secondary | ICD-10-CM | POA: Diagnosis not present

## 2015-04-27 DIAGNOSIS — Z9289 Personal history of other medical treatment: Secondary | ICD-10-CM | POA: Diagnosis not present

## 2015-04-27 DIAGNOSIS — R404 Transient alteration of awareness: Secondary | ICD-10-CM | POA: Diagnosis not present

## 2015-04-27 LAB — URINALYSIS, ROUTINE W REFLEX MICROSCOPIC
BILIRUBIN URINE: NEGATIVE
Glucose, UA: NEGATIVE mg/dL
Ketones, ur: NEGATIVE mg/dL
Leukocytes, UA: NEGATIVE
Nitrite: NEGATIVE
PH: 7 (ref 5.0–8.0)
Protein, ur: 30 mg/dL — AB
SPECIFIC GRAVITY, URINE: 1.007 (ref 1.005–1.030)

## 2015-04-27 LAB — COMPREHENSIVE METABOLIC PANEL
ALBUMIN: 3.8 g/dL (ref 3.5–5.0)
ALT: 16 U/L (ref 14–54)
AST: 25 U/L (ref 15–41)
Alkaline Phosphatase: 66 U/L (ref 38–126)
Anion gap: 15 (ref 5–15)
BUN: 14 mg/dL (ref 6–20)
CHLORIDE: 94 mmol/L — AB (ref 101–111)
CO2: 24 mmol/L (ref 22–32)
Calcium: 10 mg/dL (ref 8.9–10.3)
Creatinine, Ser: 0.95 mg/dL (ref 0.44–1.00)
GFR calc Af Amer: >60 mL/min (ref >60)
GFR, EST NON AFRICAN AMERICAN: 55 mL/min — AB (ref >60)
Glucose, Bld: 129 mg/dL — ABNORMAL HIGH (ref 65–99)
POTASSIUM: 4.8 mmol/L (ref 3.5–5.1)
Sodium: 133 mmol/L — ABNORMAL LOW (ref 135–145)
Total Bilirubin: 0.7 mg/dL (ref 0.3–1.2)
Total Protein: 8 g/dL (ref 6.5–8.1)

## 2015-04-27 LAB — CBC WITH DIFFERENTIAL/PLATELET
BASOS ABS: 0 10*3/uL (ref 0.0–0.1)
BASOS PCT: 0 %
EOS PCT: 3 %
Eosinophils Absolute: 0.2 10*3/uL (ref 0.0–0.7)
HCT: 34.4 % — ABNORMAL LOW (ref 36.0–46.0)
Hemoglobin: 11.6 g/dL — ABNORMAL LOW (ref 12.0–15.0)
Lymphocytes Relative: 27 %
Lymphs Abs: 1.9 10*3/uL (ref 0.7–4.0)
MCH: 30.3 pg (ref 26.0–34.0)
MCHC: 33.7 g/dL (ref 30.0–36.0)
MCV: 89.8 fL (ref 78.0–100.0)
MONO ABS: 0.8 10*3/uL (ref 0.1–1.0)
Monocytes Relative: 12 %
Neutro Abs: 4.1 10*3/uL (ref 1.7–7.7)
Neutrophils Relative %: 58 %
PLATELETS: 283 10*3/uL (ref 150–400)
RBC: 3.83 MIL/uL — ABNORMAL LOW (ref 3.87–5.11)
RDW: 16.6 % — AB (ref 11.5–15.5)
WBC: 7.1 10*3/uL (ref 4.0–10.5)

## 2015-04-27 LAB — URINE MICROSCOPIC-ADD ON

## 2015-04-27 MED ORDER — SODIUM CHLORIDE 0.9 % IV BOLUS (SEPSIS)
500.0000 mL | Freq: Once | INTRAVENOUS | Status: AC
  Administered 2015-04-27: 500 mL via INTRAVENOUS

## 2015-04-27 NOTE — ED Notes (Signed)
Per EMS, pt from home with c/o palpitations lasting 15 minutes and near syncope. Pt denies chest pain, shortness of breath. At this time pt c/o dizziness and fatigue. BP-170/104, HR-104, RR-18, O2-96 ra, CBG-175

## 2015-04-27 NOTE — ED Notes (Addendum)
Pt requesting IV team for IV start. EMS attempted one time, this RN attempted one time. Pt adamant that IV team start IV. Per pt, "that is usually what has to happen". [place], [place] notified

## 2015-04-27 NOTE — ED Provider Notes (Signed)
CSN: [id number]     Arrival date & time 04/27/15  1932 History   First MD Initiated Contact with Patient 04/27/15 1958     Chief Complaint  Patient presents with  . Near Syncope     (Consider location/radiation/quality/duration/timing/severity/associated sxs/prior Treatment) HPI Comments: The patient presents with complaint of dizziness described as surroundings spinning that started today while at rest. Per husband the patient had been more active than usual today and "vacuumed the whole house". After she was finished she went to sit down on the couch and symptoms of dizziness began while resting. She required her husband's help to get up to the bathroom and then went to lie down. She reports the dizziness was worse when she got up to walk and better when she was lying down. No nausea, vomiting, diaphoresis, diarrhea, or headache. While lying down she had an episode of heart racing that lasted about 15 minutes before it resolved completely. The dizziness persisted and EMS was called. She denies any CP at any time. No SOB, recent fever, cough. She was seen by her doctor recently and was found to have blood in her urine. She was referred to and has followed up with urology, Dr. Gaynelle Arabian, who is doing other testing. She is having urinary frequency now but does not see any blood and is not experiencing any pain with urination. She has a history of HLD; HTN; COPD; leukopenia; atrial fibrillation, controlled with Metoprolol and not anticoagulated; anemia requiring previous transfusions and multiple and extensive negative evaluations by GI for guaiac positive stools. No melena currently.   Patient is a 82 y.o. female presenting with near-syncope. The history is provided by the patient and the spouse. No language interpreter was used.  Near Syncope This is a new problem. The current episode started today. The problem has been gradually improving. Pertinent negatives include no abdominal pain, chest pain,  chills, fever, headaches, myalgias, nausea or vomiting.    Past Medical History  Diagnosis Date  . Hyperlipidemia   . Mitral valve prolapse     Remotely - last echo 2011 did not show this  . ANEMIA-IRON DEFICIENCY     presumed GI bleed 08/2012- anticoag stopped  . LEUKOPENIA, MILD 05/30/2008  . Hypertension 11/12/2006  . COPD 03/09/2007    ? pt is unsure  . OSTEOARTHRITIS 02/19/2009    Spinal  . OSTEOPOROSIS 11/12/2006  . COLONIC POLYPS, HX OF 03/09/2007  . HYPERGLYCEMIA 07/02/2007  . PSVT     a. Remotely, in setting of anemia.  [name] Positive H. pylori test 10/1996  . Cataract   . Blood transfusion     a. 05/2010: 3 transfusions for anemia/GIB.  [name] GI bleed     a. 05/2010: Hgb 7 at outside hospital s/p 3 u PRBC, EGD 05/2010 mild gastritis, small hiatal hernia, Schatzki ring, negative colonoscopy; capsule endoscopy 06/2010 - possible nonspecific inflammatory changes.;  b.  08/2012: 1/2 guiac +; required transfusion - Apixaban d/c'd  . Hx of echocardiogram     a. echo 2/11:  EF 55-60%, mild AI, mild RAE, mild to mod TR;   b.  a. Echo 2/14:  Mild focal basal and mild LVH, EF 60-65%, mild AI, mild RAE, trivia effusion  . Atrial fibrillation ([place])     a. Apixaban started 03/2012 - stopped after admx for GI bleed 6/14  . Spinal stenosis of lumbar region 07/16/2012  . CAD (coronary artery disease)     a. LHC 1/14:  mLAD serial 30 and 60%,  oRCA 30%, EF 60%, Afib,   . NSTEMI (non-ST elevated myocardial infarction) ([place]) 03/2012    a. Type II in setting of AF with RVR 03/2012  . Transfusion history     7/15   . GERD (gastroesophageal reflux disease)   . Asthma     per pt, she does not have asthma   Past Surgical History  Procedure Laterality Date  . Abdominal hysterectomy    . Tonsillectomy    . Tubal ligation    . Cataract extraction    . Cardiac catheterization  1992    S/P in 1992 at [facility] in [place]. This was negative for any coronary artery disease  . Carotid duplex   08/29/2003  . Ceasarean    . Enteroscopy N/A 10/10/2013    Procedure: ENTEROSCOPY;  Surgeon: Inda Castle, MD;  Location: [facility] ENDOSCOPY;  Service: Endoscopy;  Laterality: N/A;  . Left heart catheterization with coronary angiogram N/A 04/23/2012    Procedure: LEFT HEART CATHETERIZATION WITH CORONARY ANGIOGRAM;  Surgeon: Larey Dresser, MD;  Location: [facility] CATH LAB;  Service: Cardiovascular;  Laterality: N/A;  . Enteroscopy N/A 12/28/2014    Procedure: ENTEROSCOPY;  Surgeon: Inda Castle, MD;  Location: [facility]OS[facility];  Service: Endoscopy;  Laterality: N/A;  . Hot hemostasis N/A 12/28/2014    Procedure: HOT HEMOSTASIS (ARGON PLASMA COAGULATION/BICAP);  Surgeon: Inda Castle, MD;  Location: [facility];  Service: Endoscopy;  Laterality: N/A;   Family History  Problem Relation Age of Onset  . Stomach cancer Mother   . Sarcoidosis Sister   . Colon cancer Brother   . Sarcoidosis Other   . CAD Neg Hx    Social History  Substance Use Topics  . Smoking status: Never Smoker   . Smokeless tobacco: Never Used  . Alcohol Use: No   OB History    No data available     Review of Systems  Constitutional: Negative for fever and chills.  HENT: Negative.   Eyes: Negative for visual disturbance.  Respiratory: Negative.  Negative for shortness of breath.   Cardiovascular: Positive for near-syncope. Negative for chest pain.  Gastrointestinal: Negative.  Negative for nausea, vomiting, abdominal pain, diarrhea and blood in stool.  Genitourinary: Positive for frequency. Negative for dysuria and hematuria.  Musculoskeletal: Negative.  Negative for myalgias and neck stiffness.  Skin: Negative.  Negative for pallor.  Neurological: Positive for dizziness. Negative for syncope, facial asymmetry, speech difficulty and headaches.      Allergies  Doxycycline; Biaxin; and Metronidazole  Home Medications   Prior to Admission medications   Medication Sig Start Date End Date Taking? Authorizing  Provider  aspirin EC 81 MG tablet Take 81 mg by mouth every morning.  10/12/12  Yes Scott T Kathlen Mody, PA-C  atorvastatin (LIPITOR) 20 MG tablet TAKE 1 TABLET (20 MG TOTAL) BY MOUTH DAILY. 12/28/14  Yes Biagio Borg, MD  ferrous sulfate 325 (65 FE) MG tablet Take 325 mg by mouth 2 (two) times daily with a meal.    Yes Historical Provider, MD  metoprolol succinate (TOPROL-XL) 50 MG 24 hr tablet TAKE 1 TABLET (50 MG TOTAL) BY MOUTH DAILY. 12/28/14  Yes Biagio Borg, MD  Multiple Vitamin (MULTIVITAMIN) tablet Take 1 tablet by mouth daily.   Yes Historical Provider, MD  nitroGLYCERIN (NITROSTAT) 0.4 MG SL tablet Place 0.4 mg under the tongue every 5 (five) minutes as needed for chest pain.   Yes Historical Provider, MD  hydrocortisone 1 %  ointment Apply 1 application topically 2 (two) times daily as needed for itching. Patient not taking: Reported on 04/27/2015 03/29/15   Biagio Borg, MD   BP 106/75 mmHg  Pulse 95  Temp(Src) 97.6 F (36.4 C) (Oral)  Resp 14  SpO2 99% Physical Exam  Constitutional: She is oriented to person, place, and time. She appears well-developed and well-nourished.  HENT:  Head: Normocephalic.  Eyes:  No conjunctival pallor.  Neck: Normal range of motion. Neck supple.  Cardiovascular: Normal rate and regular rhythm.   No murmur heard. Pulmonary/Chest: Effort normal and breath sounds normal. She has no wheezes. She has no rales. She exhibits no tenderness.  Abdominal: Soft. Bowel sounds are normal. There is no tenderness. There is no rebound and no guarding.  Musculoskeletal: Normal range of motion. She exhibits no edema.  Neurological: She is alert and oriented to person, place, and time.  Speech clear and focused. No coordination deficits. Cognition and memory are intact. CN's 3-12 grossly intact. GCS 15  Skin: Skin is warm and dry. No rash noted.  Psychiatric: She has a normal mood and affect.    ED Course  Procedures (including critical care time) Labs Review Labs  Reviewed  CBC WITH DIFFERENTIAL/PLATELET - Abnormal; Notable for the following:    RBC 3.83 (*)    Hemoglobin 11.6 (*)    HCT 34.4 (*)    RDW 16.6 (*)    All other components within normal limits  COMPREHENSIVE METABOLIC PANEL - Abnormal; Notable for the following:    Sodium 133 (*)    Chloride 94 (*)    Glucose, Bld 129 (*)    GFR calc non Af Amer 55 (*)    All other components within normal limits  URINALYSIS, ROUTINE W REFLEX MICROSCOPIC (NOT AT [facility]) - Abnormal; Notable for the following:    Hgb urine dipstick MODERATE (*)    Protein, ur 30 (*)    All other components within normal limits  URINE MICROSCOPIC-ADD ON - Abnormal; Notable for the following:    Squamous Epithelial / LPF 0-5 (*)    Bacteria, UA RARE (*)    Casts HYALINE CASTS (*)    All other components within normal limits  URINE CULTURE  I-STAT TROPOININ, ED   Results for orders placed or performed during the hospital encounter of 04/27/15  CBC with Differential  Result Value Ref Range   WBC 7.1 4.0 - 10.5 K/uL   RBC 3.83 (L) 3.87 - 5.11 MIL/uL   Hemoglobin 11.6 (L) 12.0 - 15.0 g/dL   HCT 34.4 (L) 36.0 - 46.0 %   MCV 89.8 78.0 - 100.0 fL   MCH 30.3 26.0 - 34.0 pg   MCHC 33.7 30.0 - 36.0 g/dL   RDW 16.6 (H) 11.5 - 15.5 %   Platelets 283 150 - 400 K/uL   Neutrophils Relative % 58 %   Neutro Abs 4.1 1.7 - 7.7 K/uL   Lymphocytes Relative 27 %   Lymphs Abs 1.9 0.7 - 4.0 K/uL   Monocytes Relative 12 %   Monocytes Absolute 0.8 0.1 - 1.0 K/uL   Eosinophils Relative 3 %   Eosinophils Absolute 0.2 0.0 - 0.7 K/uL   Basophils Relative 0 %   Basophils Absolute 0.0 0.0 - 0.1 K/uL  Comprehensive metabolic panel  Result Value Ref Range   Sodium 133 (L) 135 - 145 mmol/L   Potassium 4.8 3.5 - 5.1 mmol/L   Chloride 94 (L) 101 - 111 mmol/L   CO2  24 22 - 32 mmol/L   Glucose, Bld 129 (H) 65 - 99 mg/dL   BUN 14 6 - 20 mg/dL   Creatinine, Ser 0.95 0.44 - 1.00 mg/dL   Calcium 10.0 8.9 - 10.3 mg/dL   Total Protein 8.0  6.5 - 8.1 g/dL   Albumin 3.8 3.5 - 5.0 g/dL   AST 25 15 - 41 U/L   ALT 16 14 - 54 U/L   Alkaline Phosphatase 66 38 - 126 U/L   Total Bilirubin 0.7 0.3 - 1.2 mg/dL   GFR calc non Af Amer 55 (L) >60 mL/min   GFR calc Af Amer >60 >60 mL/min   Anion gap 15 5 - 15  Urinalysis, Routine w reflex microscopic (not at [facility])  Result Value Ref Range   Color, Urine YELLOW YELLOW   APPearance CLEAR CLEAR   Specific Gravity, Urine 1.007 1.005 - 1.030   pH 7.0 5.0 - 8.0   Glucose, UA NEGATIVE NEGATIVE mg/dL   Hgb urine dipstick MODERATE (A) NEGATIVE   Bilirubin Urine NEGATIVE NEGATIVE   Ketones, ur NEGATIVE NEGATIVE mg/dL   Protein, ur 30 (A) NEGATIVE mg/dL   Nitrite NEGATIVE NEGATIVE   Leukocytes, UA NEGATIVE NEGATIVE  Urine microscopic-add on  Result Value Ref Range   Squamous Epithelial / LPF 0-5 (A) NONE SEEN   WBC, UA 0-5 0 - 5 WBC/hpf   RBC / HPF 6-30 0 - 5 RBC/hpf   Bacteria, UA RARE (A) NONE SEEN   Casts HYALINE CASTS (A) NEGATIVE     Imaging Review No results found. I have personally reviewed and evaluated these images and lab results as part of my medical decision-making.   EKG Interpretation   Date/Time:  Friday April 27 2015 19:42:12 EST Ventricular Rate:  100 PR Interval:  162 QRS Duration: 97 QT Interval:  380 QTC Calculation: 490 R Axis:   83 Text Interpretation:  Sinus tachycardia Ventricular premature complex  Aberrant complex Borderline right axis deviation Probable left ventricular  hypertrophy Borderline prolonged QT interval tachycardia new since  previous  Confirmed by YAO  MD, DAVID ([id number]) on 04/27/2015 8:51:57 PM      MDM   Final diagnoses:  None    1. Dizziness  The patient presented with c/o dizziness, resolved episode of palpitations with h/o A-fib, mild tachycardia w/o afib on EKG. The patient is orthostatic in the ED and IVF provided. Symptoms of dizziness improved and patient is ambulatory without assistance or symptoms.   Labs are  essentially unremarkable. No sign of infection, anemia, or other imbalance. She is felt stable for discharge home. She is seen and evaluated by Dr. Darl Householder and is felt appropriate to go home.    Charlann Lange, PA-C 04/27/15 [id number]  Wandra Arthurs, MD 04/27/15 [phone]

## 2015-04-27 NOTE — ED Notes (Signed)
Pt ambulated in the hall without difficulty. PA notified.

## 2015-04-28 LAB — I-STAT TROPONIN, ED: TROPONIN I, POC: 0.02 ng/mL (ref 0.00–0.08)

## 2015-04-28 NOTE — Discharge Instructions (Signed)

## 2015-04-29 LAB — URINE CULTURE: Culture: 2000

## 2015-05-16 DIAGNOSIS — R3129 Other microscopic hematuria: Secondary | ICD-10-CM | POA: Diagnosis not present

## 2015-05-17 ENCOUNTER — Other Ambulatory Visit: Payer: Self-pay | Admitting: Otolaryngology

## 2015-05-17 DIAGNOSIS — E041 Nontoxic single thyroid nodule: Secondary | ICD-10-CM

## 2015-05-24 DIAGNOSIS — Z01419 Encounter for gynecological examination (general) (routine) without abnormal findings: Secondary | ICD-10-CM | POA: Diagnosis not present

## 2015-05-24 DIAGNOSIS — Z1231 Encounter for screening mammogram for malignant neoplasm of breast: Secondary | ICD-10-CM | POA: Diagnosis not present

## 2015-05-24 DIAGNOSIS — Z6820 Body mass index (BMI) 20.0-20.9, adult: Secondary | ICD-10-CM | POA: Diagnosis not present

## 2015-06-01 ENCOUNTER — Other Ambulatory Visit: Payer: Self-pay | Admitting: Internal Medicine

## 2015-07-06 ENCOUNTER — Other Ambulatory Visit: Payer: Self-pay | Admitting: Internal Medicine

## 2015-07-30 ENCOUNTER — Ambulatory Visit: Payer: Medicare Other

## 2015-08-28 ENCOUNTER — Encounter: Payer: Medicare Other | Admitting: Internal Medicine

## 2015-10-05 ENCOUNTER — Ambulatory Visit (INDEPENDENT_AMBULATORY_CARE_PROVIDER_SITE_OTHER): Payer: Medicare Other | Admitting: Internal Medicine

## 2015-10-05 ENCOUNTER — Other Ambulatory Visit (INDEPENDENT_AMBULATORY_CARE_PROVIDER_SITE_OTHER): Payer: Medicare Other

## 2015-10-05 ENCOUNTER — Encounter: Payer: Self-pay | Admitting: Internal Medicine

## 2015-10-05 VITALS — BP 122/80 | HR 90 | Temp 98.0°F | Resp 20 | Wt 114.0 lb

## 2015-10-05 DIAGNOSIS — R7302 Impaired glucose tolerance (oral): Secondary | ICD-10-CM

## 2015-10-05 DIAGNOSIS — M19041 Primary osteoarthritis, right hand: Secondary | ICD-10-CM | POA: Diagnosis not present

## 2015-10-05 DIAGNOSIS — D509 Iron deficiency anemia, unspecified: Secondary | ICD-10-CM

## 2015-10-05 DIAGNOSIS — R21 Rash and other nonspecific skin eruption: Secondary | ICD-10-CM

## 2015-10-05 DIAGNOSIS — M19042 Primary osteoarthritis, left hand: Secondary | ICD-10-CM

## 2015-10-05 DIAGNOSIS — M19049 Primary osteoarthritis, unspecified hand: Secondary | ICD-10-CM | POA: Insufficient documentation

## 2015-10-05 DIAGNOSIS — E785 Hyperlipidemia, unspecified: Secondary | ICD-10-CM | POA: Diagnosis not present

## 2015-10-05 DIAGNOSIS — J449 Chronic obstructive pulmonary disease, unspecified: Secondary | ICD-10-CM

## 2015-10-05 DIAGNOSIS — I10 Essential (primary) hypertension: Secondary | ICD-10-CM

## 2015-10-05 LAB — CBC WITH DIFFERENTIAL/PLATELET
BASOS ABS: 0 10*3/uL (ref 0.0–0.1)
Basophils Relative: 0.3 % (ref 0.0–3.0)
Eosinophils Absolute: 0.2 10*3/uL (ref 0.0–0.7)
Eosinophils Relative: 1.9 % (ref 0.0–5.0)
HEMATOCRIT: 32.2 % — AB (ref 36.0–46.0)
Hemoglobin: 10.6 g/dL — ABNORMAL LOW (ref 12.0–15.0)
LYMPHS PCT: 29.7 % (ref 12.0–46.0)
Lymphs Abs: 2.8 10*3/uL (ref 0.7–4.0)
MCHC: 32.8 g/dL (ref 30.0–36.0)
MCV: 92.8 fl (ref 78.0–100.0)
MONOS PCT: 12 % (ref 3.0–12.0)
Monocytes Absolute: 1.1 10*3/uL — ABNORMAL HIGH (ref 0.1–1.0)
NEUTROS PCT: 56.1 % (ref 43.0–77.0)
Neutro Abs: 5.2 10*3/uL (ref 1.4–7.7)
Platelets: 350 10*3/uL (ref 150.0–400.0)
RBC: 3.47 Mil/uL — AB (ref 3.87–5.11)
RDW: 16.3 % — ABNORMAL HIGH (ref 11.5–15.5)
WBC: 9.4 10*3/uL (ref 4.0–10.5)

## 2015-10-05 LAB — BASIC METABOLIC PANEL
BUN: 17 mg/dL (ref 6–23)
CALCIUM: 9.9 mg/dL (ref 8.4–10.5)
CO2: 30 meq/L (ref 19–32)
CREATININE: 0.91 mg/dL (ref 0.40–1.20)
Chloride: 97 mEq/L (ref 96–112)
GFR: 76.45 mL/min (ref >60.00)
GLUCOSE: 85 mg/dL (ref 70–99)
Potassium: 4.9 mEq/L (ref 3.5–5.1)
Sodium: 132 mEq/L — ABNORMAL LOW (ref 135–145)

## 2015-10-05 LAB — LIPID PANEL
CHOLESTEROL: 160 mg/dL (ref 0–200)
HDL: 69.5 mg/dL (ref >39.00)
LDL Cholesterol: 74 mg/dL (ref 0–99)
NONHDL: 90.23
TRIGLYCERIDES: 80 mg/dL (ref 0.0–149.0)
Total CHOL/HDL Ratio: 2
VLDL: 16 mg/dL (ref 0.0–40.0)

## 2015-10-05 LAB — URINALYSIS, ROUTINE W REFLEX MICROSCOPIC
BILIRUBIN URINE: NEGATIVE
KETONES UR: NEGATIVE
LEUKOCYTES UA: NEGATIVE
Nitrite: NEGATIVE
Specific Gravity, Urine: <=1.005 — AB (ref 1.000–1.030)
TOTAL PROTEIN, URINE-UPE24: NEGATIVE
UROBILINOGEN UA: 0.2 (ref 0.0–1.0)
Urine Glucose: NEGATIVE
WBC UA: NONE SEEN (ref 0–?)
pH: 5.5 (ref 5.0–8.0)

## 2015-10-05 LAB — HEPATIC FUNCTION PANEL
ALBUMIN: 4.2 g/dL (ref 3.5–5.2)
ALT: 14 U/L (ref 0–35)
AST: 19 U/L (ref 0–37)
Alkaline Phosphatase: 69 U/L (ref 39–117)
Bilirubin, Direct: 0.1 mg/dL (ref 0.0–0.3)
TOTAL PROTEIN: 7.9 g/dL (ref 6.0–8.3)
Total Bilirubin: 0.3 mg/dL (ref 0.2–1.2)

## 2015-10-05 LAB — IBC PANEL
Iron: 53 ug/dL (ref 42–145)
SATURATION RATIOS: 13.5 % — AB (ref 20.0–50.0)
Transferrin: 281 mg/dL (ref 212.0–360.0)

## 2015-10-05 LAB — HEMOGLOBIN A1C: HEMOGLOBIN A1C: 6.1 % (ref 4.6–6.5)

## 2015-10-05 LAB — TSH: TSH: 1.29 u[IU]/mL (ref 0.35–4.50)

## 2015-10-05 MED ORDER — ACETAMINOPHEN 500 MG PO TABS: 1000.0000 mg | ORAL_TABLET | Freq: Two times a day (BID) | ORAL | Status: AC

## 2015-10-05 MED ORDER — DICLOFENAC SODIUM 1 % TD GEL: 4.0000 g | Freq: Four times a day (QID) | TRANSDERMAL | Status: DC | PRN

## 2015-10-05 NOTE — Assessment & Plan Note (Addendum)
mlild to mod, persistent symttoms wax and wane severity, no current effusions, functionally adequate, Also for volt gel prn,  to f/u any worsening symptoms or concerns  Note:  Total time for pt hx, exam, review of record with pt in the room, determination of diagnoses and plan for further eval and tx is > 40 min, with over 50% spent in coordination and counseling of patient

## 2015-10-05 NOTE — Progress Notes (Signed)
Subjective:    Patient ID: Lindsay Doyle, female    DOB: [birth year], 82 y.o.   MRN: [id number]  HPI Here for yearly f/u;  Overall doing ok;  Pt denies Chest pain, worsening SOB, DOE, wheezing, orthopnea, PND, worsening LE edema, palpitations, dizziness or syncope.  Pt denies neurological change such as new headache, facial or extremity weakness.  Pt denies polydipsia, polyuria, or low sugar symptoms. Pt states overall good compliance with treatment and medications, good tolerability, and has been trying to follow appropriate diet.  Pt denies worsening depressive symptoms, suicidal ideation or panic. No fever, night sweats, wt loss, loss of appetite, or other constitutional symptoms.  Pt states good ability with ADL's, has low fall risk, home safety reviewed and adequate, no other significant changes in hearing or vision, and only occasionally active with exercise. Has ongoing FMS symtpoms mostly with upper back/trapezoid type pain.  Does also have bilat hand OA changes and mult finger joint pains without significant effusions, just pain and cant make a good fist sometimes.   Plans to check with GI re next colonoscopy date.  Rash from last visit resolved with topical steroid use  Past Medical History  Diagnosis Date  . Hyperlipidemia   . Mitral valve prolapse     Remotely - last echo 2011 did not show this  . ANEMIA-IRON DEFICIENCY     presumed GI bleed 08/2012- anticoag stopped  . LEUKOPENIA, MILD 05/30/2008  . Hypertension 11/12/2006  . COPD 03/09/2007    ? pt is unsure  . OSTEOARTHRITIS 02/19/2009    Spinal  . OSTEOPOROSIS 11/12/2006  . COLONIC POLYPS, HX OF 03/09/2007  . HYPERGLYCEMIA 07/02/2007  . PSVT     a. Remotely, in setting of anemia.  [name] Positive H. pylori test 10/1996  . Cataract   . Blood transfusion     a. 05/2010: 3 transfusions for anemia/GIB.  [name] GI bleed     a. 05/2010: Hgb 7 at outside hospital s/p 3 u PRBC, EGD 05/2010 mild gastritis, small hiatal hernia, Schatzki ring,  negative colonoscopy; capsule endoscopy 06/2010 - possible nonspecific inflammatory changes.;  b.  08/2012: 1/2 guiac +; required transfusion - Apixaban d/c'd  . Hx of echocardiogram     a. echo 2/11:  EF 55-60%, mild AI, mild RAE, mild to mod TR;   b.  a. Echo 2/14:  Mild focal basal and mild LVH, EF 60-65%, mild AI, mild RAE, trivia effusion  . Atrial fibrillation ([place])     a. Apixaban started 03/2012 - stopped after admx for GI bleed 6/14  . Spinal stenosis of lumbar region 07/16/2012  . CAD (coronary artery disease)     a. LHC 1/14:  mLAD serial 30 and 60%, oRCA 30%, EF 60%, Afib,   . NSTEMI (non-ST elevated myocardial infarction) ([place]) 03/2012    a. Type II in setting of AF with RVR 03/2012  . Transfusion history     7/15   . GERD (gastroesophageal reflux disease)   . Asthma     per pt, she does not have asthma   Past Surgical History  Procedure Laterality Date  . Abdominal hysterectomy    . Tonsillectomy    . Tubal ligation    . Cataract extraction    . Cardiac catheterization  1992    S/P in 1992 at [facility] in [place]. This was negative for any coronary artery disease  . Carotid duplex  08/29/2003  . Ceasarean    .  Enteroscopy N/A 10/10/2013    Procedure: ENTEROSCOPY;  Surgeon: Inda Castle, MD;  Location: [facility];  Service: Endoscopy;  Laterality: N/A;  . Left heart catheterization with coronary angiogram N/A 04/23/2012    Procedure: LEFT HEART CATHETERIZATION WITH CORONARY ANGIOGRAM;  Surgeon: Larey Dresser, MD;  Location: [facility] CATH LAB;  Service: Cardiovascular;  Laterality: N/A;  . Enteroscopy N/A 12/28/2014    Procedure: ENTEROSCOPY;  Surgeon: Inda Castle, MD;  Location: [facility];  Service: Endoscopy;  Laterality: N/A;  . Hot hemostasis N/A 12/28/2014    Procedure: HOT HEMOSTASIS (ARGON PLASMA COAGULATION/BICAP);  Surgeon: Inda Castle, MD;  Location: [facility];  Service: Endoscopy;  Laterality: N/A;    reports that she has never  smoked. She has never used smokeless tobacco. She reports that she does not drink alcohol or use illicit drugs. family history includes Colon cancer in her brother; Sarcoidosis in her other and sister; Stomach cancer in her mother. There is no history of CAD. Allergies  Allergen Reactions  . Doxycycline Diarrhea    Possible diarrhea  . Biaxin [Clarithromycin] Other (See Comments)    diarrhea  . Metronidazole Other (See Comments)    Pt had difficulty swallowing this and says it was "horrible" to take. No allergic reaction.   Current Outpatient Prescriptions on File Prior to Visit  Medication Sig Dispense Refill  . aspirin EC 81 MG tablet Take 81 mg by mouth every morning.     [name] atorvastatin (LIPITOR) 20 MG tablet TAKE 1 TABLET (20 MG TOTAL) BY MOUTH DAILY. 90 tablet 1  . ferrous sulfate 325 (65 FE) MG tablet Take 325 mg by mouth 2 (two) times daily with a meal.     . metoprolol succinate (TOPROL-XL) 50 MG 24 hr tablet TAKE 1 TABLET (50 MG TOTAL) BY MOUTH DAILY. 90 tablet 1  . Multiple Vitamin (MULTIVITAMIN) tablet Take 1 tablet by mouth daily.    . nitroGLYCERIN (NITROSTAT) 0.4 MG SL tablet Place 0.4 mg under the tongue every 5 (five) minutes as needed for chest pain.     No current facility-administered medications on file prior to visit.     Review of Systems Constitutional: Negative for increased diaphoresis, or other activity, appetite or siginficant weight change other than noted HENT: Negative for worsening hearing loss, ear pain, facial swelling, mouth sores and neck stiffness.   Eyes: Negative for other worsening pain, redness or visual disturbance.  Respiratory: Negative for choking or stridor Cardiovascular: Negative for other chest pain and palpitations.  Gastrointestinal: Negative for worsening diarrhea, blood in stool, or abdominal distention Genitourinary: Negative for hematuria, flank pain or change in urine volume.  Musculoskeletal: Negative for myalgias or other joint  complaints.  Skin: Negative for other color change and wound or drainage.  Neurological: Negative for syncope and numbness. other than noted Hematological: Negative for adenopathy. or other swelling Psychiatric/Behavioral: Negative for hallucinations, SI, self-injury, decreased concentration or other worsening agitation.      Objective:   Physical Exam BP 122/80 mmHg  Pulse 90  Temp(Src) 98 F (36.7 C) (Oral)  Resp 20  Wt 114 lb (51.71 kg)  SpO2 99% VS noted, thin Constitutional: Pt is oriented to person, place, and time. Appears well-developed and well-nourished, in no significant distress Head: Normocephalic and atraumatic  Eyes: Conjunctivae and EOM are normal. Pupils are equal, round, and reactive to light Right Ear: External ear normal.  Left Ear: External ear normal Nose: Nose normal.  Mouth/Throat: Oropharynx is clear and moist  Neck: Normal range of motion. Neck supple. No JVD present. No tracheal deviation present or significant neck LA or mass Cardiovascular: Normal rate, regular rhythm, normal heart sounds and intact distal pulses.   Pulmonary/Chest: Effort normal and breath sounds without rales or wheezing  Abdominal: Soft. Bowel sounds are normal. NT. No HSM  Musculoskeletal: Normal range of motion. Exhibits no edema; has signficant OA changes of mult fingers joints bilat Lymphadenopathy: Has no cervical adenopathy.  Neurological: Pt is alert and oriented to person, place, and time. Pt has normal reflexes. No cranial nerve deficit. Motor grossly intact Skin: Skin is warm and dry. No rash noted or new ulcers Psychiatric:  Has normal mood and affect. Behavior is normal.   Lab Results  Component Value Date   WBC 7.1 04/27/2015   HGB 11.6* 04/27/2015   HCT 34.4* 04/27/2015   PLT 283 04/27/2015   GLUCOSE 129* 04/27/2015   CHOL 153 03/06/2015   TRIG 90.0 03/06/2015   HDL 66.70 03/06/2015   LDLDIRECT 223.7 01/08/2012   LDLCALC 69 03/06/2015   ALT 16 04/27/2015    AST 25 04/27/2015   NA 133* 04/27/2015   K 4.8 04/27/2015   CL 94* 04/27/2015   CREATININE 0.95 04/27/2015   BUN 14 04/27/2015   CO2 24 04/27/2015   TSH 1.56 03/06/2015   INR 1.09 11/14/2014   HGBA1C 5.7 07/27/2014    Most recent cxr:  IMPRESSION: Stable cardiomegaly. No acute abnormality is seen.   Electronically Signed  By: Inez Catalina M.D.  On: 05/10/2014 09:40     Assessment & Plan:

## 2015-10-05 NOTE — Assessment & Plan Note (Signed)
stable overall by history and exam, recent data reviewed with pt, and pt to continue medical treatment as before,  to f/u any worsening symptoms or concerns SpO2 Readings from Last 3 Encounters:  10/05/15 99%  04/27/15 96%  03/29/15 95%

## 2015-10-05 NOTE — Assessment & Plan Note (Signed)
No recent overt bleeding, also for iron, cbc check

## 2015-10-05 NOTE — Assessment & Plan Note (Signed)
stable overall by history and exam, recent data reviewed with pt, and pt to continue medical treatment as before,  to f/u any worsening symptoms or concerns Lab Results  Component Value Date   HGBA1C 5.7 07/27/2014

## 2015-10-05 NOTE — Assessment & Plan Note (Signed)
stable overall by history and exam, recent data reviewed with pt, and pt to continue medical treatment as before,  to f/u any worsening symptoms or concerns BP Readings from Last 3 Encounters:  10/05/15 122/80  04/28/15 116/72  03/29/15 118/70

## 2015-10-05 NOTE — Patient Instructions (Signed)
Please take all new medication as prescribed - the topical pain medication for the hands (voltaren gel)  Please continue all other medications as before, and refills have been done if requested.  Please have the pharmacy call with any other refills you may need.  Please continue your efforts at being more active, low cholesterol diet, and weight control.  You are otherwise up to date with prevention measures today.  Please keep your appointments with your specialists as you may have planned  Please go to the LAB in the Basement (turn left off the elevator) for the tests to be done today  You will be contacted by phone if any changes need to be made immediately.  Otherwise, you will receive a letter about your results with an explanation, but please check with MyChart first.  Please remember to sign up for MyChart if you have not done so, as this will be important to you in the future with finding out test results, communicating by private email, and scheduling acute appointments online when needed.  Please return in 6 months, or sooner if needed

## 2015-10-05 NOTE — Assessment & Plan Note (Signed)
Resolved with topical steroid cream, ok to follow

## 2015-10-05 NOTE — Progress Notes (Signed)
Pre visit review using our clinic review tool, if applicable. No additional management support is needed unless otherwise documented below in the visit note. 

## 2015-10-05 NOTE — Assessment & Plan Note (Signed)
stable overall by history and exam, recent data reviewed with pt, and pt to continue medical treatment as before,  to f/u any worsening symptoms or concerns Lab Results  Component Value Date   LDLCALC 69 03/06/2015    for f/u lab

## 2015-10-08 ENCOUNTER — Telehealth: Payer: Self-pay | Admitting: Internal Medicine

## 2015-10-08 NOTE — Telephone Encounter (Signed)
Pt called in said that per her ins she has a colonscopy every 10 years.  She is not due for another 3 years

## 2015-11-25 ENCOUNTER — Other Ambulatory Visit: Payer: Self-pay | Admitting: Internal Medicine

## 2015-11-30 ENCOUNTER — Encounter: Payer: Self-pay | Admitting: Internal Medicine

## 2015-11-30 ENCOUNTER — Ambulatory Visit (INDEPENDENT_AMBULATORY_CARE_PROVIDER_SITE_OTHER): Payer: Medicare Other | Admitting: Internal Medicine

## 2015-11-30 VITALS — BP 126/70 | HR 99 | Temp 98.0°F | Resp 20 | Wt 117.0 lb

## 2015-11-30 DIAGNOSIS — N184 Chronic kidney disease, stage 4 (severe): Secondary | ICD-10-CM

## 2015-11-30 DIAGNOSIS — I10 Essential (primary) hypertension: Secondary | ICD-10-CM

## 2015-11-30 DIAGNOSIS — R7302 Impaired glucose tolerance (oral): Secondary | ICD-10-CM

## 2015-11-30 DIAGNOSIS — H029 Unspecified disorder of eyelid: Secondary | ICD-10-CM | POA: Diagnosis not present

## 2015-11-30 DIAGNOSIS — D638 Anemia in other chronic diseases classified elsewhere: Secondary | ICD-10-CM

## 2015-11-30 NOTE — Progress Notes (Signed)
Pre visit review using our clinic review tool, if applicable. No additional management support is needed unless otherwise documented below in the visit note. 

## 2015-11-30 NOTE — Progress Notes (Signed)
Subjective:    Patient ID: Lindsay Doyle, female    DOB: 12-Aug-1933, 82 y.o.   MRN: [id number]  HPI  Here to f/u with c/o puffy area to right lower lateral eyelid out of proportion to the left side, and seems lightened, very concerned about developing vitiligo, since a friend has this and is treated "differently" at church.  No eye pain, fever, vision change, HA, ST, sinus congestion or d/c, cough and Pt denies chest pain, increased sob or doe, wheezing, orthopnea, PND, increased LE swelling, palpitations, dizziness or syncope.Pt denies new neurological symptoms such as new headache, or facial or extremity weakness or numbness   Pt denies polydipsia, polyuria,     Past Medical History:  Diagnosis Date  . ANEMIA-IRON DEFICIENCY    presumed GI bleed 08/2012- anticoag stopped  . Asthma    per pt, she does not have asthma  . Atrial fibrillation ([place])    a. Apixaban started 03/2012 - stopped after admx for GI bleed 6/14  . Blood transfusion    a. 05/2010: 3 transfusions for anemia/GIB.  [name] CAD (coronary artery disease)    a. LHC 1/14:  mLAD serial 30 and 60%, oRCA 30%, EF 60%, Afib,   . Cataract   . COLONIC POLYPS, HX OF 03/09/2007  . COPD 03/09/2007   ? pt is unsure  . GERD (gastroesophageal reflux disease)   . GI bleed    a. 05/2010: Hgb 7 at outside hospital s/p 3 u PRBC, EGD 05/2010 mild gastritis, small hiatal hernia, Schatzki ring, negative colonoscopy; capsule endoscopy 06/2010 - possible nonspecific inflammatory changes.;  b.  08/2012: 1/2 guiac +; required transfusion - Apixaban d/c'd  . Hx of echocardiogram    a. echo 2/11:  EF 55-60%, mild AI, mild RAE, mild to mod TR;   b.  a. Echo 2/14:  Mild focal basal and mild LVH, EF 60-65%, mild AI, mild RAE, trivia effusion  . HYPERGLYCEMIA 07/02/2007  . Hyperlipidemia   . Hypertension 11/12/2006  . LEUKOPENIA, MILD 05/30/2008  . Mitral valve prolapse    Remotely - last echo 2011 did not show this  . NSTEMI (non-ST elevated myocardial infarction)  ([place]) 03/2012   a. Type II in setting of AF with RVR 03/2012  . OSTEOARTHRITIS 02/19/2009   Spinal  . OSTEOPOROSIS 11/12/2006  . Positive H. pylori test 10/1996  . PSVT    a. Remotely, in setting of anemia.  [name] Spinal stenosis of lumbar region 07/16/2012  . Transfusion history    7/15    Past Surgical History:  Procedure Laterality Date  . ABDOMINAL HYSTERECTOMY    . CARDIAC CATHETERIZATION  1992   S/P in 1992 at [facility] in [place]. This was negative for any coronary artery disease  . carotid duplex  08/29/2003  . CATARACT EXTRACTION    . ceasarean    . ENTEROSCOPY N/A 10/10/2013   Procedure: ENTEROSCOPY;  Surgeon: Inda Castle, MD;  Location: [facility];  Service: Endoscopy;  Laterality: N/A;  . ENTEROSCOPY N/A 12/28/2014   Procedure: ENTEROSCOPY;  Surgeon: Inda Castle, MD;  Location: [facility]OS[facility];  Service: Endoscopy;  Laterality: N/A;  . HOT HEMOSTASIS N/A 12/28/2014   Procedure: HOT HEMOSTASIS (ARGON PLASMA COAGULATION/BICAP);  Surgeon: Inda Castle, MD;  Location: [facility];  Service: Endoscopy;  Laterality: N/A;  . LEFT HEART CATHETERIZATION WITH CORONARY ANGIOGRAM N/A 04/23/2012   Procedure: LEFT HEART CATHETERIZATION WITH CORONARY ANGIOGRAM;  Surgeon: Larey Dresser, MD;  Location: [facility] CATH LAB;  Service: Cardiovascular;  Laterality: N/A;  . TONSILLECTOMY    . TUBAL LIGATION      reports that she has never smoked. She has never used smokeless tobacco. She reports that she does not drink alcohol or use drugs. family history includes Colon cancer in her brother; Sarcoidosis in her other and sister; Stomach cancer in her mother. Allergies  Allergen Reactions  . Doxycycline Diarrhea    Possible diarrhea  . Biaxin [Clarithromycin] Other (See Comments)    diarrhea  . Metronidazole Other (See Comments)    Pt had difficulty swallowing this and says it was "horrible" to take. No allergic reaction.   Current Outpatient Prescriptions on File  Prior to Visit  Medication Sig Dispense Refill  . acetaminophen (TYLENOL) 500 MG tablet Take 2 tablets (1,000 mg total) by mouth 2 (two) times daily. 60 tablet 11  . aspirin EC 81 MG tablet Take 81 mg by mouth every morning.     [name] atorvastatin (LIPITOR) 20 MG tablet TAKE 1 TABLET (20 MG TOTAL) BY MOUTH DAILY. 90 tablet 1  . ferrous sulfate 325 (65 FE) MG tablet Take 325 mg by mouth 2 (two) times daily with a meal.     . metoprolol succinate (TOPROL-XL) 50 MG 24 hr tablet TAKE 1 TABLET (50 MG TOTAL) BY MOUTH DAILY. 90 tablet 1  . Multiple Vitamin (MULTIVITAMIN) tablet Take 1 tablet by mouth daily.    . nitroGLYCERIN (NITROSTAT) 0.4 MG SL tablet Place 0.4 mg under the tongue every 5 (five) minutes as needed for chest pain.     No current facility-administered medications on file prior to visit.    Review of Systems  Constitutional: Negative for unusual diaphoresis or night sweats HENT: Negative for ear swelling or discharge Eyes: Negative for worsening visual haziness  Respiratory: Negative for choking and stridor.   Gastrointestinal: Negative for distension or worsening eructation Genitourinary: Negative for retention or change in urine volume.  Musculoskeletal: Negative for other MSK pain or swelling Skin: Negative for color change and worsening wound Neurological: Negative for tremors and numbness other than noted  Psychiatric/Behavioral: Negative for decreased concentration or agitation other than above        Objective:   Physical Exam BP 126/70   Pulse 99   Temp 98 F (36.7 C) (Oral)   Resp 20   Wt 117 lb (53.1 kg)   SpO2 96%   BMI 20.08 kg/m  VS noted,  Constitutional: Pt appears in no apparent distress HENT: Head: NCAT.  Right Ear: External ear normal.  Left Ear: External ear normal.  Eyes: . Pupils are equal, round, and reactive to light. Conjunctivae and EOM are normal, right lower eyelid lateral aspect only with mild puffy appaerance fatty like compared to left  without discrete lipoma, I am unable to apprecaite any signfiicant lighting of pigment in the area Neck: Normal range of motion. Neck supple.  Cardiovascular: Normal rate and regular rhythm.   Pulmonary/Chest: Effort normal and breath sounds without rales or wheezing.  Neurological: Pt is alert. Not confused , motor grossly intact Skin: Skin is warm. No rash, no LE edema Psychiatric: Pt behavior is normal. No agitation. 1+ nervous    Assessment & Plan:

## 2015-11-30 NOTE — Patient Instructions (Signed)
OK to try OTC zaditor for possible allergies if you want (OTC)  Otherwise makeup would be ok  Please continue all other medications as before, and refills have been done if requested.  Please have the pharmacy call with any other refills you may need.  Please continue your efforts at being more active, low cholesterol diet, and weight control.  You are otherwise up to date with prevention measures today.  Please keep your appointments with your specialists as you may have planned

## 2015-12-01 NOTE — Assessment & Plan Note (Signed)
stable overall by history and exam, recent data reviewed with pt, and pt to continue medical treatment as before,  to f/u any worsening symptoms or concerns Lab Results  Component Value Date   WBC 9.4 10/05/2015   HGB 10.6 (L) 10/05/2015   HCT 32.2 (L) 10/05/2015   MCV 92.8 10/05/2015   PLT 350.0 10/05/2015

## 2015-12-01 NOTE — Assessment & Plan Note (Signed)
stable overall by history and exam, recent data reviewed with pt, and pt to continue medical treatment as before,  to f/u any worsening symptoms or concerns Lab Results  Component Value Date   HGBA1C 6.1 10/05/2015    

## 2015-12-01 NOTE — Assessment & Plan Note (Signed)
stable overall by history and exam, recent data reviewed with pt, and pt to continue medical treatment as before,  to f/u any worsening symptoms or concerns Lab Results  Component Value Date   CREATININE 0.91 10/05/2015

## 2015-12-01 NOTE — Assessment & Plan Note (Addendum)
C/w fatty tissue asymmetry compared to left with sagging tissues below the eyes, no specific tx is needed,  to f/u any worsening symptoms or concerns, tried to reassure pt she does not appear to have vitiligo, ok to try zaditor otc but suspect may not be allergic related, pt plans to use makeup to hide the "discoloration"

## 2015-12-28 ENCOUNTER — Other Ambulatory Visit: Payer: Self-pay | Admitting: Internal Medicine

## 2016-01-03 ENCOUNTER — Ambulatory Visit (INDEPENDENT_AMBULATORY_CARE_PROVIDER_SITE_OTHER): Payer: Medicare Other | Admitting: Internal Medicine

## 2016-01-03 VITALS — BP 128/72 | HR 90 | Temp 98.0°F | Resp 20 | Wt 116.1 lb

## 2016-01-03 DIAGNOSIS — I10 Essential (primary) hypertension: Secondary | ICD-10-CM | POA: Diagnosis not present

## 2016-01-03 DIAGNOSIS — Z23 Encounter for immunization: Secondary | ICD-10-CM

## 2016-01-03 DIAGNOSIS — R7302 Impaired glucose tolerance (oral): Secondary | ICD-10-CM

## 2016-01-03 DIAGNOSIS — J45909 Unspecified asthma, uncomplicated: Secondary | ICD-10-CM | POA: Diagnosis not present

## 2016-01-03 DIAGNOSIS — J309 Allergic rhinitis, unspecified: Secondary | ICD-10-CM

## 2016-01-03 NOTE — Assessment & Plan Note (Signed)
Mild to mod, declines depomedrol, for otc claritin/nasacort asd,  to f/u any worsening symptoms or concerns

## 2016-01-03 NOTE — Assessment & Plan Note (Signed)
stable overall by history and exam, recent data reviewed with pt, and pt to continue medical treatment as before,  to f/u any worsening symptoms or concerns BP Readings from Last 3 Encounters:  01/03/16 128/72  11/30/15 126/70  10/05/15 122/80

## 2016-01-03 NOTE — Assessment & Plan Note (Signed)
stable overall by history and exam, recent data reviewed with pt, and pt to continue medical treatment as before,  to f/u any worsening symptoms or concerns @LASTSAO2(3)@  

## 2016-01-03 NOTE — Patient Instructions (Signed)
You had the flu shot today  Please take all new medication as prescribed - the OTC Claritin 10 mg per day, and the OTC Nasacort AQ as directed  Please continue all other medications as before, and refills have been done if requested.  Please have the pharmacy call with any other refills you may need.  Please keep your appointments with your specialists as you may have planned

## 2016-01-03 NOTE — Progress Notes (Signed)
Pre visit review using our clinic review tool, if applicable. No additional management support is needed unless otherwise documented below in the visit note. 

## 2016-01-03 NOTE — Progress Notes (Signed)
Subjective:    Patient ID: Lindsay Doyle, female    DOB: 02/02/1934, 82 y.o.   MRN: [id number]  HPI   Here to f/u -.Does have several days ongoing nasal mod to severe nasal allergy symptoms with clearish congestion, itch and sneezing, and mild left facial discomfort she would not call a "pain" without fever, ST, cough, swelling or wheezing.   Pt denies polydipsia, polyuria.  Pt denies chest pain, increased sob or doe, wheezing, orthopnea, PND, increased LE swelling, palpitations, dizziness or syncope.  Pt denies new neurological symptoms such as new headache, or facial or extremity weakness or numbness Denies worsening depressive symptoms, suicidal ideation, or panic; has ongoing anxiety Past Medical History:  Diagnosis Date  . ANEMIA-IRON DEFICIENCY    presumed GI bleed 08/2012- anticoag stopped  . Asthma    per pt, she does not have asthma  . Atrial fibrillation ([place])    a. Apixaban started 03/2012 - stopped after admx for GI bleed 6/14  . Blood transfusion    a. 05/2010: 3 transfusions for anemia/GIB.  [name] CAD (coronary artery disease)    a. LHC 1/14:  mLAD serial 30 and 60%, oRCA 30%, EF 60%, Afib,   . Cataract   . COLONIC POLYPS, HX OF 03/09/2007  . COPD 03/09/2007   ? pt is unsure  . GERD (gastroesophageal reflux disease)   . GI bleed    a. 05/2010: Hgb 7 at outside hospital s/p 3 u PRBC, EGD 05/2010 mild gastritis, small hiatal hernia, Schatzki ring, negative colonoscopy; capsule endoscopy 06/2010 - possible nonspecific inflammatory changes.;  b.  08/2012: 1/2 guiac +; required transfusion - Apixaban d/c'd  . Hx of echocardiogram    a. echo 2/11:  EF 55-60%, mild AI, mild RAE, mild to mod TR;   b.  a. Echo 2/14:  Mild focal basal and mild LVH, EF 60-65%, mild AI, mild RAE, trivia effusion  . HYPERGLYCEMIA 07/02/2007  . Hyperlipidemia   . Hypertension 11/12/2006  . LEUKOPENIA, MILD 05/30/2008  . Mitral valve prolapse    Remotely - last echo 2011 did not show this  . NSTEMI (non-ST elevated  myocardial infarction) ([place]) 03/2012   a. Type II in setting of AF with RVR 03/2012  . OSTEOARTHRITIS 02/19/2009   Spinal  . OSTEOPOROSIS 11/12/2006  . Positive H. pylori test 10/1996  . PSVT    a. Remotely, in setting of anemia.  [name] Spinal stenosis of lumbar region 07/16/2012  . Transfusion history    7/15    Past Surgical History:  Procedure Laterality Date  . ABDOMINAL HYSTERECTOMY    . CARDIAC CATHETERIZATION  1992   S/P in 1992 at [facility] in [place]. This was negative for any coronary artery disease  . carotid duplex  08/29/2003  . CATARACT EXTRACTION    . ceasarean    . ENTEROSCOPY N/A 10/10/2013   Procedure: ENTEROSCOPY;  Surgeon: Inda Castle, MD;  Location: [facility];  Service: Endoscopy;  Laterality: N/A;  . ENTEROSCOPY N/A 12/28/2014   Procedure: ENTEROSCOPY;  Surgeon: Inda Castle, MD;  Location: [facility];  Service: Endoscopy;  Laterality: N/A;  . HOT HEMOSTASIS N/A 12/28/2014   Procedure: HOT HEMOSTASIS (ARGON PLASMA COAGULATION/BICAP);  Surgeon: Inda Castle, MD;  Location: [facility];  Service: Endoscopy;  Laterality: N/A;  . LEFT HEART CATHETERIZATION WITH CORONARY ANGIOGRAM N/A 04/23/2012   Procedure: LEFT HEART CATHETERIZATION WITH CORONARY ANGIOGRAM;  Surgeon: Larey Dresser, MD;  Location: [facility] CATH  LAB;  Service: Cardiovascular;  Laterality: N/A;  . TONSILLECTOMY    . TUBAL LIGATION      reports that she has never smoked. She has never used smokeless tobacco. She reports that she does not drink alcohol or use drugs. family history includes Colon cancer in her brother; Sarcoidosis in her other and sister; Stomach cancer in her mother. Allergies  Allergen Reactions  . Doxycycline Diarrhea    Possible diarrhea  . Biaxin [Clarithromycin] Other (See Comments)    diarrhea  . Metronidazole Other (See Comments)    Pt had difficulty swallowing this and says it was "horrible" to take. No allergic reaction.   Current Outpatient  Prescriptions on File Prior to Visit  Medication Sig Dispense Refill  . acetaminophen (TYLENOL) 500 MG tablet Take 2 tablets (1,000 mg total) by mouth 2 (two) times daily. 60 tablet 11  . aspirin EC 81 MG tablet Take 81 mg by mouth every morning.     [name] atorvastatin (LIPITOR) 20 MG tablet TAKE 1 TABLET (20 MG TOTAL) BY MOUTH DAILY. 90 tablet 1  . ferrous sulfate 325 (65 FE) MG tablet Take 325 mg by mouth 2 (two) times daily with a meal.     . metoprolol succinate (TOPROL-XL) 50 MG 24 hr tablet TAKE 1 TABLET (50 MG TOTAL) BY MOUTH DAILY. 90 tablet 1  . Multiple Vitamin (MULTIVITAMIN) tablet Take 1 tablet by mouth daily.    . nitroGLYCERIN (NITROSTAT) 0.4 MG SL tablet Place 0.4 mg under the tongue every 5 (five) minutes as needed for chest pain.     No current facility-administered medications on file prior to visit.    Review of Systems  Constitutional: Negative for unusual diaphoresis or night sweats HENT: Negative for ear swelling or discharge Eyes: Negative for worsening visual haziness  Respiratory: Negative for choking and stridor.   Gastrointestinal: Negative for distension or worsening eructation Genitourinary: Negative for retention or change in urine volume.  Musculoskeletal: Negative for other MSK pain or swelling Skin: Negative for color change and worsening wound Neurological: Negative for tremors and numbness other than noted  Psychiatric/Behavioral: Negative for decreased concentration or agitation other than above       Objective:   Physical Exam BP 128/72   Pulse 90   Temp 98 F (36.7 C) (Oral)   Resp 20   Wt 116 lb 2 oz (52.7 kg)   SpO2 96%   BMI 19.93 kg/m  VS noted,  Constitutional: Pt appears in no apparent distress HENT: Head: NCAT.  Right Ear: External ear normal.  Left Ear: External ear normal.  Bilat tm's with mild erythema.  Max sinus areas non tender.  Pharynx with mild erythema, no exudate Eyes: . Pupils are equal, round, and reactive to light.  Conjunctivae and EOM are normal Neck: Normal range of motion. Neck supple.  Cardiovascular: Normal rate and regular rhythm.   Pulmonary/Chest: Effort normal and breath sounds without rales or wheezing.  Neurological: Pt is alert. Not confused , motor grossly intact Skin: Skin is warm. No rash, no LE edema Psychiatric: Pt behavior is normal. No agitation. Mild nervous     Assessment & Plan:

## 2016-01-03 NOTE — Assessment & Plan Note (Signed)
stable overall by history and exam, recent data reviewed with pt, and pt to continue medical treatment as before,  to f/u any worsening symptoms or concerns Lab Results  Component Value Date   HGBA1C 6.1 10/05/2015

## 2016-01-16 ENCOUNTER — Telehealth: Payer: Self-pay | Admitting: Internal Medicine

## 2016-01-16 DIAGNOSIS — I4891 Unspecified atrial fibrillation: Secondary | ICD-10-CM

## 2016-01-16 DIAGNOSIS — I25810 Atherosclerosis of coronary artery bypass graft(s) without angina pectoris: Secondary | ICD-10-CM

## 2016-01-16 NOTE — Telephone Encounter (Signed)
Patient states her heart doctor is leaving. She would like Dr. Jenny Reichmann to tell her which doctor she should go to now. She does not know which one to pick and she is hoping he can just pick on for her. Please follow up with patient. Thank you.

## 2016-01-16 NOTE — Telephone Encounter (Signed)
[place] for referral to Dr Stanford Breed - I will do

## 2016-01-17 NOTE — Telephone Encounter (Signed)
Called pt and let her know about the referral.

## 2016-01-19 ENCOUNTER — Emergency Department (HOSPITAL_COMMUNITY): Payer: Medicare Other

## 2016-01-19 ENCOUNTER — Encounter (HOSPITAL_COMMUNITY): Payer: Self-pay | Admitting: *Deleted

## 2016-01-19 ENCOUNTER — Inpatient Hospital Stay (HOSPITAL_COMMUNITY)
Admission: EM | Admit: 2016-01-19 | Discharge: 2016-01-22 | DRG: 378 | Disposition: A | Discharge status: Home / Self Care | Payer: Medicare Other | Attending: Family Medicine | Admitting: Family Medicine

## 2016-01-19 DIAGNOSIS — R002 Palpitations: Secondary | ICD-10-CM

## 2016-01-19 DIAGNOSIS — N183 Chronic kidney disease, stage 3 unspecified: Secondary | ICD-10-CM | POA: Diagnosis present

## 2016-01-19 DIAGNOSIS — K219 Gastro-esophageal reflux disease without esophagitis: Secondary | ICD-10-CM | POA: Diagnosis present

## 2016-01-19 DIAGNOSIS — Z800 Family history of malignant neoplasm of digestive organs: Secondary | ICD-10-CM

## 2016-01-19 DIAGNOSIS — I252 Old myocardial infarction: Secondary | ICD-10-CM

## 2016-01-19 DIAGNOSIS — F411 Generalized anxiety disorder: Secondary | ICD-10-CM | POA: Diagnosis present

## 2016-01-19 DIAGNOSIS — I2510 Atherosclerotic heart disease of native coronary artery without angina pectoris: Secondary | ICD-10-CM | POA: Diagnosis present

## 2016-01-19 DIAGNOSIS — E785 Hyperlipidemia, unspecified: Secondary | ICD-10-CM | POA: Diagnosis present

## 2016-01-19 DIAGNOSIS — I4891 Unspecified atrial fibrillation: Secondary | ICD-10-CM | POA: Diagnosis not present

## 2016-01-19 DIAGNOSIS — I130 Hypertensive heart and chronic kidney disease with heart failure and stage 1 through stage 4 chronic kidney disease, or unspecified chronic kidney disease: Secondary | ICD-10-CM | POA: Diagnosis present

## 2016-01-19 DIAGNOSIS — N189 Chronic kidney disease, unspecified: Secondary | ICD-10-CM

## 2016-01-19 DIAGNOSIS — M1990 Unspecified osteoarthritis, unspecified site: Secondary | ICD-10-CM | POA: Diagnosis present

## 2016-01-19 DIAGNOSIS — Q2733 Arteriovenous malformation of digestive system vessel: Secondary | ICD-10-CM | POA: Diagnosis not present

## 2016-01-19 DIAGNOSIS — Z86010 Personal history of colonic polyps: Secondary | ICD-10-CM | POA: Diagnosis not present

## 2016-01-19 DIAGNOSIS — N179 Acute kidney failure, unspecified: Secondary | ICD-10-CM | POA: Diagnosis present

## 2016-01-19 DIAGNOSIS — I959 Hypotension, unspecified: Secondary | ICD-10-CM | POA: Diagnosis present

## 2016-01-19 DIAGNOSIS — K922 Gastrointestinal hemorrhage, unspecified: Secondary | ICD-10-CM

## 2016-01-19 DIAGNOSIS — D649 Anemia, unspecified: Secondary | ICD-10-CM | POA: Diagnosis not present

## 2016-01-19 DIAGNOSIS — D509 Iron deficiency anemia, unspecified: Secondary | ICD-10-CM | POA: Diagnosis present

## 2016-01-19 DIAGNOSIS — R531 Weakness: Secondary | ICD-10-CM | POA: Diagnosis not present

## 2016-01-19 DIAGNOSIS — K31819 Angiodysplasia of stomach and duodenum without bleeding: Secondary | ICD-10-CM

## 2016-01-19 DIAGNOSIS — K5521 Angiodysplasia of colon with hemorrhage: Secondary | ICD-10-CM | POA: Diagnosis not present

## 2016-01-19 DIAGNOSIS — I5032 Chronic diastolic (congestive) heart failure: Secondary | ICD-10-CM

## 2016-01-19 DIAGNOSIS — M797 Fibromyalgia: Secondary | ICD-10-CM | POA: Diagnosis present

## 2016-01-19 DIAGNOSIS — N184 Chronic kidney disease, stage 4 (severe): Secondary | ICD-10-CM | POA: Diagnosis present

## 2016-01-19 DIAGNOSIS — Z7982 Long term (current) use of aspirin: Secondary | ICD-10-CM

## 2016-01-19 DIAGNOSIS — M810 Age-related osteoporosis without current pathological fracture: Secondary | ICD-10-CM | POA: Diagnosis present

## 2016-01-19 DIAGNOSIS — K222 Esophageal obstruction: Secondary | ICD-10-CM | POA: Diagnosis present

## 2016-01-19 DIAGNOSIS — E869 Volume depletion, unspecified: Secondary | ICD-10-CM | POA: Diagnosis present

## 2016-01-19 LAB — BASIC METABOLIC PANEL
Anion gap: 8 (ref 5–15)
BUN: 20 mg/dL (ref 6–20)
CALCIUM: 9.4 mg/dL (ref 8.9–10.3)
CO2: 24 mmol/L (ref 22–32)
CREATININE: 1.04 mg/dL — AB (ref 0.44–1.00)
Chloride: 100 mmol/L — ABNORMAL LOW (ref 101–111)
GFR calc Af Amer: 57 mL/min — ABNORMAL LOW (ref >60)
GFR, EST NON AFRICAN AMERICAN: 49 mL/min — AB (ref >60)
GLUCOSE: 105 mg/dL — AB (ref 65–99)
Potassium: 4.9 mmol/L (ref 3.5–5.1)
SODIUM: 132 mmol/L — AB (ref 135–145)

## 2016-01-19 LAB — URINALYSIS, ROUTINE W REFLEX MICROSCOPIC
BILIRUBIN URINE: NEGATIVE
GLUCOSE, UA: NEGATIVE mg/dL
Ketones, ur: NEGATIVE mg/dL
Leukocytes, UA: NEGATIVE
Nitrite: NEGATIVE
PROTEIN: NEGATIVE mg/dL
Specific Gravity, Urine: 1.005 (ref 1.005–1.030)
pH: 7 (ref 5.0–8.0)

## 2016-01-19 LAB — IRON AND TIBC
IRON: 43 ug/dL (ref 28–170)
SATURATION RATIOS: 11 % (ref 10.4–31.8)
TIBC: 391 ug/dL (ref 250–450)
UIBC: 348 ug/dL

## 2016-01-19 LAB — URINE MICROSCOPIC-ADD ON: BACTERIA UA: NONE SEEN

## 2016-01-19 LAB — PREPARE RBC (CROSSMATCH)

## 2016-01-19 LAB — FERRITIN: Ferritin: 19 ng/mL (ref 11–307)

## 2016-01-19 LAB — I-STAT TROPONIN, ED: Troponin i, poc: 0.07 ng/mL (ref 0.00–0.08)

## 2016-01-19 LAB — RETICULOCYTES
RBC.: 2.23 MIL/uL — ABNORMAL LOW (ref 3.87–5.11)
RETIC CT PCT: 5 % — AB (ref 0.4–3.1)
Retic Count, Absolute: 111.5 10*3/uL (ref 19.0–186.0)

## 2016-01-19 LAB — VITAMIN B12: VITAMIN B 12: 1097 pg/mL — AB (ref 180–914)

## 2016-01-19 LAB — FOLATE: FOLATE: 48.6 ng/mL (ref >5.9)

## 2016-01-19 MED ORDER — ACETAMINOPHEN 325 MG PO TABS: 650.0000 mg | ORAL_TABLET | Freq: Four times a day (QID) | ORAL | Status: DC | PRN

## 2016-01-19 MED ORDER — FERROUS SULFATE 325 (65 FE) MG PO TABS
325.0000 mg | ORAL_TABLET | Freq: Two times a day (BID) | ORAL | Status: DC
  Administered 2016-01-19 – 2016-01-22 (×4): 325 mg via ORAL
  Filled 2016-01-19 (×4): qty 1

## 2016-01-19 MED ORDER — ACETAMINOPHEN 650 MG RE SUPP: 650.0000 mg | Freq: Four times a day (QID) | RECTAL | Status: DC | PRN

## 2016-01-19 MED ORDER — ONDANSETRON HCL 4 MG PO TABS: 4.0000 mg | ORAL_TABLET | Freq: Four times a day (QID) | ORAL | Status: DC | PRN

## 2016-01-19 MED ORDER — TRAZODONE HCL 50 MG PO TABS: 25.0000 mg | ORAL_TABLET | Freq: Every evening | ORAL | Status: DC | PRN

## 2016-01-19 MED ORDER — SENNOSIDES-DOCUSATE SODIUM 8.6-50 MG PO TABS
1.0000 | ORAL_TABLET | Freq: Every evening | ORAL | Status: DC | PRN
  Filled 2016-01-19: qty 1

## 2016-01-19 MED ORDER — MAGNESIUM CITRATE PO SOLN: 1.0000 | Freq: Once | ORAL | Status: DC | PRN

## 2016-01-19 MED ORDER — ACETAMINOPHEN 500 MG PO TABS
1000.0000 mg | ORAL_TABLET | Freq: Two times a day (BID) | ORAL | Status: DC
  Administered 2016-01-20 – 2016-01-22 (×3): 1000 mg via ORAL
  Filled 2016-01-19 (×5): qty 2

## 2016-01-19 MED ORDER — SODIUM CHLORIDE 0.9% FLUSH
3.0000 mL | Freq: Two times a day (BID) | INTRAVENOUS | Status: DC
  Administered 2016-01-19 – 2016-01-22 (×5): 3 mL via INTRAVENOUS

## 2016-01-19 MED ORDER — SODIUM CHLORIDE 0.9 % IV SOLN
10.0000 mL/h | Freq: Once | INTRAVENOUS | Status: AC
  Administered 2016-01-19: 10 mL/h via INTRAVENOUS

## 2016-01-19 MED ORDER — BISACODYL 10 MG RE SUPP: 10.0000 mg | Freq: Every day | RECTAL | Status: DC | PRN

## 2016-01-19 MED ORDER — ATORVASTATIN CALCIUM 20 MG PO TABS
20.0000 mg | ORAL_TABLET | Freq: Every day | ORAL | Status: DC
  Administered 2016-01-19 – 2016-01-21 (×2): 20 mg via ORAL
  Filled 2016-01-19 (×2): qty 1

## 2016-01-19 MED ORDER — HYDROCODONE-ACETAMINOPHEN 5-325 MG PO TABS: 1.0000 | ORAL_TABLET | ORAL | Status: DC | PRN

## 2016-01-19 MED ORDER — METOPROLOL SUCCINATE ER 50 MG PO TB24
50.0000 mg | ORAL_TABLET | Freq: Every day | ORAL | Status: DC
  Administered 2016-01-21 – 2016-01-22 (×2): 50 mg via ORAL
  Filled 2016-01-19 (×2): qty 1

## 2016-01-19 MED ORDER — SODIUM CHLORIDE 0.9 % IV BOLUS (SEPSIS): 250.0000 mL | Freq: Once | INTRAVENOUS | Status: DC

## 2016-01-19 MED ORDER — ONDANSETRON HCL 4 MG/2ML IJ SOLN: 4.0000 mg | Freq: Four times a day (QID) | INTRAMUSCULAR | Status: DC | PRN

## 2016-01-19 NOTE — Progress Notes (Signed)
Paged on call to inform him of patient's low blood pressure.  She is currently on her 2nd unit of blood.

## 2016-01-19 NOTE — ED Notes (Signed)
IV team at bedside 

## 2016-01-19 NOTE — ED Notes (Signed)
The patient does not have pain but says she is so tired she cannot even walk.  She was able to ride to the restroom in a wheelchair and stand up and get on the toilet herself.

## 2016-01-19 NOTE — ED Notes (Signed)
Marisela RN notified of critical lab Hgb 6.6

## 2016-01-19 NOTE — Progress Notes (Signed)
Received aptient from ED. Patient is alert and oriented times 4. Will be receiving blood transfusions today. Husband is at bedside.

## 2016-01-19 NOTE — H&P (Signed)
History and Physical    Lindsay Doyle WUJ:[id number] DOB: 1934-01-26 DOA: 01/19/2016   PCP: Cathlean Cower, MD   Patient coming from:  Home    Chief Complaint: Generalized weakness   HPI: Lindsay Doyle is a 82 y.o. female with medical history significant for Atrial fibrillation,  Lifelong history of Iron deficiency anemia and colon polyps requiring blood transfusions in the past, last about 8 months ago, presenting today with progressive generalized weakness and palpitations (controlled with metoprolol). Denies fevers, chills, night sweats, vision changes, or mucositis. Denies any respiratory complaints. Denies any chest pain. Denies lower extremity swelling. Denies nausea, heartburn or change in bowel habits. Last bowel movement on 10/20, chronically darker due to iron pills . Denies abdominal pain. Appetite is normal. Denies any dysuria. Denies abnormal skin rashes, or neuropathy. Denies any bleeding issues such as epistaxis, hematemesis, hematuria or hematochezia. She feels very week at the time of admission . Denies recent infections. Denies pica. Denies bleeding issues. No recent surgeries. She does take daily ASA 81 mg. No other blood thinners. Last hospitalization for symptomatic anemia in 11/2014   ED Course:  BP 92/78   Pulse 98   Temp 98.3 F (36.8 C) (Oral)   Resp 14   SpO2 100%    Cr 1.04 with EGFR 57  EKG   Sinus rhythm with Premature atrial complexes Otherwise normal ECG  Tn 0.07  Hb 6.6 , MCV 94.6, platelets normal. WBC 11.9  Glu 105 CXR without abnormalities  Receiving 2 units of blood  Hemoccult pending   Review of Systems: As per HPI otherwise 10 point review of systems negative.   Past Medical History:  Diagnosis Date  . ANEMIA-IRON DEFICIENCY    presumed GI bleed 08/2012- anticoag stopped  . Asthma    per pt, she does not have asthma  . Atrial fibrillation ([place])    a. Apixaban started 03/2012 - stopped after admx for GI bleed 6/14  . Blood transfusion    a.  05/2010: 3 transfusions for anemia/GIB.  [name] CAD (coronary artery disease)    a. LHC 1/14:  mLAD serial 30 and 60%, oRCA 30%, EF 60%, Afib,   . Cataract   . COLONIC POLYPS, HX OF 03/09/2007  . COPD 03/09/2007   ? pt is unsure  . GERD (gastroesophageal reflux disease)   . GI bleed    a. 05/2010: Hgb 7 at outside hospital s/p 3 u PRBC, EGD 05/2010 mild gastritis, small hiatal hernia, Schatzki ring, negative colonoscopy; capsule endoscopy 06/2010 - possible nonspecific inflammatory changes.;  b.  08/2012: 1/2 guiac +; required transfusion - Apixaban d/c'd  . Hx of echocardiogram    a. echo 2/11:  EF 55-60%, mild AI, mild RAE, mild to mod TR;   b.  a. Echo 2/14:  Mild focal basal and mild LVH, EF 60-65%, mild AI, mild RAE, trivia effusion  . HYPERGLYCEMIA 07/02/2007  . Hyperlipidemia   . Hypertension 11/12/2006  . LEUKOPENIA, MILD 05/30/2008  . Mitral valve prolapse    Remotely - last echo 2011 did not show this  . NSTEMI (non-ST elevated myocardial infarction) ([place]) 03/2012   a. Type II in setting of AF with RVR 03/2012  . OSTEOARTHRITIS 02/19/2009   Spinal  . OSTEOPOROSIS 11/12/2006  . Positive H. pylori test 10/1996  . PSVT    a. Remotely, in setting of anemia.  [name] Spinal stenosis of lumbar region 07/16/2012  . Transfusion history    7/15     Past  Surgical History:  Procedure Laterality Date  . ABDOMINAL HYSTERECTOMY    . CARDIAC CATHETERIZATION  1992   S/P in 1992 at [facility] in [place]. This was negative for any coronary artery disease  . carotid duplex  08/29/2003  . CATARACT EXTRACTION    . ceasarean    . ENTEROSCOPY N/A 10/10/2013   Procedure: ENTEROSCOPY;  Surgeon: Inda Castle, MD;  Location: [facility] ENDOSCOPY;  Service: Endoscopy;  Laterality: N/A;  . ENTEROSCOPY N/A 12/28/2014   Procedure: ENTEROSCOPY;  Surgeon: Inda Castle, MD;  Location: [facility] ENDOSCOPY;  Service: Endoscopy;  Laterality: N/A;  . HOT HEMOSTASIS N/A 12/28/2014   Procedure: HOT HEMOSTASIS (ARGON  PLASMA COAGULATION/BICAP);  Surgeon: Inda Castle, MD;  Location: [facility];  Service: Endoscopy;  Laterality: N/A;  . LEFT HEART CATHETERIZATION WITH CORONARY ANGIOGRAM N/A 04/23/2012   Procedure: LEFT HEART CATHETERIZATION WITH CORONARY ANGIOGRAM;  Surgeon: Larey Dresser, MD;  Location: [facility] CATH LAB;  Service: Cardiovascular;  Laterality: N/A;  . TONSILLECTOMY    . TUBAL LIGATION      Social History Social History   Social History  . Marital status: Married    Spouse name: N/A  . Number of children: 1  . Years of education: N/A   Occupational History  .      retired   Social History Main Topics  . Smoking status: Never Smoker  . Smokeless tobacco: Never Used  . Alcohol use No  . Drug use: No  . Sexual activity: Not on file   Other Topics Concern  . Not on file   Social History Narrative  . No narrative on file     Allergies  Allergen Reactions  . Doxycycline Diarrhea    Possible diarrhea  . Biaxin [Clarithromycin] Other (See Comments)    diarrhea  . Metronidazole Other (See Comments)    Pt had difficulty swallowing this and says it was "horrible" to take. No allergic reaction.    Family History  Problem Relation Age of Onset  . Stomach cancer Mother   . Sarcoidosis Sister   . Colon cancer Brother   . Sarcoidosis Other   . CAD Neg Hx       Prior to Admission medications   Medication Sig Start Date End Date Taking? Authorizing Provider  acetaminophen (TYLENOL) 500 MG tablet Take 2 tablets (1,000 mg total) by mouth 2 (two) times daily. 10/05/15  Yes Biagio Borg, MD  aspirin EC 81 MG tablet Take 81 mg by mouth every morning.  10/12/12  Yes Scott T Kathlen Mody, PA-C  atorvastatin (LIPITOR) 20 MG tablet TAKE 1 TABLET (20 MG TOTAL) BY MOUTH DAILY. 12/28/15  Yes Biagio Borg, MD  ferrous sulfate 325 (65 FE) MG tablet Take 325 mg by mouth 2 (two) times daily with a meal.    Yes Historical Provider, MD  metoprolol succinate (TOPROL-XL) 50 MG 24 hr tablet TAKE 1  TABLET (50 MG TOTAL) BY MOUTH DAILY. 11/26/15  Yes Biagio Borg, MD  Multiple Vitamin (MULTIVITAMIN) tablet Take 1 tablet by mouth daily.   Yes Historical Provider, MD  nitroGLYCERIN (NITROSTAT) 0.4 MG SL tablet Place 0.4 mg under the tongue every 5 (five) minutes as needed for chest pain.    Historical Provider, MD    Physical Exam:    Vitals:   01/19/16 1241 01/19/16 1430  BP: 152/72 92/78  Pulse: 98   Resp: 20 14  Temp: 98.3 F (36.8 C)   TempSrc:  Oral   SpO2: 100%        Constitutional: NAD,but anxious appearing. Pale  Vitals:   01/19/16 1241 01/19/16 1430  BP: 152/72 92/78  Pulse: 98   Resp: 20 14  Temp: 98.3 F (36.8 C)   TempSrc: Oral   SpO2: 100%    Eyes: PERRL, lids and conjunctivae pale  ENMT: Mucous membranes are moist. Posterior pharynx clear of any exudate or lesions.Normal dentition.  Neck: normal, supple, no masses, no thyromegaly Respiratory: clear to auscultation bilaterally, no wheezing, no crackles. Normal respiratory effort. No accessory muscle use.  Cardiovascular:  Regular rate and rhythm, no murmurs / rubs / gallops. No extremity edema. 2+ pedal pulses. No carotid bruits.  Abdomen: no tenderness, no masses palpated. No hepatosplenomegaly. Bowel sounds positive.  Musculoskeletal: no clubbing / cyanosis. No joint deformity upper and lower extremities. Good ROM, no contractures. Normal muscle tone.  Skin: no rashes, lesions, ulcers.  Neurologic: CN 2-12 grossly intact. Sensation intact, DTR normal. Strength 5/5 in all 4.  Psychiatric: Normal judgment and insight. Alert and oriented x 3. Anxious  mood.     Labs on Admission: I have personally reviewed following labs and imaging studies  CBC:  Recent Labs Lab 01/19/16 1248  WBC 11.9*  HGB 6.6*  HCT 21.1*  MCV 94.6  PLT 151    Basic Metabolic Panel:  Recent Labs Lab 01/19/16 1248  NA 132*  K 4.9  CL 100*  CO2 24  GLUCOSE 105*  BUN 20  CREATININE 1.04*  CALCIUM 9.4     GFR: CrCl cannot be calculated (Unknown ideal weight.).  Liver Function Tests: No results for input(s): AST, ALT, ALKPHOS, BILITOT, PROT, ALBUMIN in the last 168 hours. No results for input(s): LIPASE, AMYLASE in the last 168 hours. No results for input(s): AMMONIA in the last 168 hours.  Coagulation Profile: No results for input(s): INR, PROTIME in the last 168 hours.  Cardiac Enzymes: No results for input(s): CKTOTAL, CKMB, CKMBINDEX, TROPONINI in the last 168 hours.  BNP (last 3 results) No results for input(s): PROBNP in the last 8760 hours.  HbA1C: No results for input(s): HGBA1C in the last 72 hours.  CBG: No results for input(s): GLUCAP in the last 168 hours.  Lipid Profile: No results for input(s): CHOL, HDL, LDLCALC, TRIG, CHOLHDL, LDLDIRECT in the last 72 hours.  Thyroid Function Tests: No results for input(s): TSH, T4TOTAL, FREET4, T3FREE, THYROIDAB in the last 72 hours.  Anemia Panel: No results for input(s): VITAMINB12, FOLATE, FERRITIN, TIBC, IRON, RETICCTPCT in the last 72 hours.  Urine analysis:    Component Value Date/Time   COLORURINE YELLOW 10/05/2015 [place] 10/05/2015 1355   LABSPEC <=1.005 (A) 10/05/2015 1355   LABSPEC 1.010 04/03/2011 1135   PHURINE 5.5 10/05/2015 1355   GLUCOSEU NEGATIVE 10/05/2015 1355   HGBUR MODERATE (A) 10/05/2015 1355   BILIRUBINUR NEGATIVE 10/05/2015 1355   BILIRUBINUR Neg 05/08/2014 1052   BILIRUBINUR Negative 04/03/2011 1135   KETONESUR NEGATIVE 10/05/2015 1355   PROTEINUR 30 (A) 04/27/2015 2114   UROBILINOGEN 0.2 10/05/2015 1355   NITRITE NEGATIVE 10/05/2015 1355   LEUKOCYTESUR NEGATIVE 10/05/2015 1355   LEUKOCYTESUR Moderate 04/03/2011 1135    Sepsis Labs: '@LABRCNTIP' (procalcitonin:4,lacticidven:4) )No results found for this or any previous visit (from the past 240 hour(s)).   Radiological Exams on Admission: Dg Chest 2 View  Result Date: 01/19/2016 CLINICAL DATA:  Weakness and  fatigue EXAM: CHEST  2 VIEW COMPARISON:  05/10/2014 FINDINGS: Cardiac shadow is mildly  enlarged. The thoracic aorta is tortuous but stable. The lungs are well aerated bilaterally. No focal infiltrate or sizable effusion is seen. No acute bony abnormality is noted. IMPRESSION: No acute abnormality seen. Electronically Signed   By: Inez Catalina M.D.   On: 01/19/2016 14:49    EKG: Independently reviewed.  Assessment/Plan Active Problems:   Iron deficiency anemia   History of colonic polyps   Schatzki's ring   CKD (chronic kidney disease) stage 4, GFR 15-29 ml/min (HCC)   Atrial fibrillation    Symptomatic anemia   Anxiety state   Gastric AVM   Acute on chronic renal failure (HCC)   Fibromyalgia     Symptomatic anemia in a patient with history of Iron deficiency anemia and GIB/h/o colon polyps in the past, receiving prior transfusions, now presentiing with generalized weakness, likely due to Hb 6.6. With normal MCV BL 10. PAtient has  A history f Schatzki's ring, AVM in prior endoscopies.    Hcult pending  To receive 2 units here  Admit to tele obs  Continue Iron supplements Agree with Tx 32 units of blood GI consult if hemoccult positive  OT/PT Repeat CBC in am  MAy need GI eval if if bleeding occurs or Hcult positive  Anemia panel prior to transfusion  Atrial Fibrillation , currently on SR per EKG  Rate controlled -Continue meds including metoprolol    Anxiety Continue home  Hyperlipidemia Continue home statins   Chronic kidney disease stage Cr 1.04 at baseline  Lab Results  Component Value Date   CREATININE 1.04 (H) 01/19/2016   CREATININE 0.91 10/05/2015   CREATININE 0.95 04/27/2015   IVF Hold diuretics  Repeat CMET in am    DVT prophylaxis: SCD's   Code Status:   Full    Family Communication:  Discussed with patient tDisposition Plan: Expect patient to be discharged to home after condition improves Consults called:    None Admission status:Tele  Obs     Dorette Hartel E, PA-C [facility]   If 7PM-7AM, please contact night-coverage [email or web address] Password [email or web address]  01/19/2016, 3:25 PM

## 2016-01-19 NOTE — ED Triage Notes (Signed)
Pt reports having palpitations yesterday. Hx of afib. Pt took extra dose of metoprolol yesterday and that improved palpitations. Today her only complaint is fatigue and weakness. Denies cp or sob.

## 2016-01-19 NOTE — ED Provider Notes (Signed)
[facility] DEPT Provider Note   CSN: [id number] Arrival date & time: 01/19/16  1231     History   Chief Complaint Chief Complaint  Patient presents with  . Weakness  . Palpitations    HPI Lindsay Doyle is a 82 y.o. female.  HPI Patient states she has a history of atrial fibrillation. Yesterday she was having palpitations. She called her doctor and was instructed to take an extra dose of her Toprol. She reports she did that and she had resolution of the palpitations. She reports she just continues to feel very fatigued. She feels like she could sleep all the time. He has no energy. No syncope. No fevers. No nausea vomiting or diarrhea. No chest pain or shortness of breath. She reports she does have a history of anemia and was last transfused 6-8 months ago. She reports her stool has appeared dark but she also takes an iron supplement. Past Medical History:  Diagnosis Date  . ANEMIA-IRON DEFICIENCY    presumed GI bleed 08/2012- anticoag stopped  . Asthma    per pt, she does not have asthma  . Atrial fibrillation ([place])    a. Apixaban started 03/2012 - stopped after admx for GI bleed 6/14  . Blood transfusion    a. 05/2010: 3 transfusions for anemia/GIB.  [name] CAD (coronary artery disease)    a. LHC 1/14:  mLAD serial 30 and 60%, oRCA 30%, EF 60%, Afib,   . Cataract   . COLONIC POLYPS, HX OF 03/09/2007  . COPD 03/09/2007   ? pt is unsure  . GERD (gastroesophageal reflux disease)   . GI bleed    a. 05/2010: Hgb 7 at outside hospital s/p 3 u PRBC, EGD 05/2010 mild gastritis, small hiatal hernia, Schatzki ring, negative colonoscopy; capsule endoscopy 06/2010 - possible nonspecific inflammatory changes.;  b.  08/2012: 1/2 guiac +; required transfusion - Apixaban d/c'd  . Hx of echocardiogram    a. echo 2/11:  EF 55-60%, mild AI, mild RAE, mild to mod TR;   b.  a. Echo 2/14:  Mild focal basal and mild LVH, EF 60-65%, mild AI, mild RAE, trivia effusion  . HYPERGLYCEMIA 07/02/2007  .  Hyperlipidemia   . Hypertension 11/12/2006  . LEUKOPENIA, MILD 05/30/2008  . Mitral valve prolapse    Remotely - last echo 2011 did not show this  . NSTEMI (non-ST elevated myocardial infarction) ([place]) 03/2012   a. Type II in setting of AF with RVR 03/2012  . OSTEOARTHRITIS 02/19/2009   Spinal  . OSTEOPOROSIS 11/12/2006  . Positive H. pylori test 10/1996  . PSVT    a. Remotely, in setting of anemia.  [name] Spinal stenosis of lumbar region 07/16/2012  . Transfusion history    7/15     Patient Active Problem List   Diagnosis Date Noted  . Eyelid abnormality 11/30/2015  . Osteoarthritis, hand 10/05/2015  . Rash 03/29/2015  . Atrial fibrillation ([place]) 02/02/2015  . Fibromyalgia 01/25/2015  . Myalgia 12/08/2014  . Helicobacter pylori ab+ 05/19/2014  . Dehydration   . Enteritis   . Abdominal pain, generalized   . Malnutrition of moderate degree ([place]) 05/10/2014  . Chronic hyponatremia 05/09/2014  . Gastroenteritis 05/09/2014  . Guaiac positive stools 05/08/2014  . Tachycardia 05/08/2014  . Occult blood positive stool 05/08/2014  . AKI (acute kidney injury) ([place]) 10/21/2013  . Acute on chronic renal failure ([place]) 10/21/2013  . UTI (lower urinary tract infection) 10/20/2013  . ARF (acute renal failure) ([place]) 10/20/2013  .  Gastric AVM 10/10/2013  . Dark stools 09/21/2013  . Anxiety state 04/20/2013  . Cervical spine arthritis ([place]) 03/11/2013  . Neck pain on left side 02/15/2013  . Lump in neck 09/23/2012  . Symptomatic anemia 09/17/2012  . Other malaise and fatigue 09/14/2012  . Dizziness 09/14/2012  . Leg cramp 09/14/2012  . Spinal stenosis of lumbar region 07/16/2012  . Osteoporosis 04/27/2012  . Coronary Artery Disease 04/24/2012  . NSTEMI (non-ST elevated myocardial infarction) ([place]) 04/23/2012  . Atrial fibrillation  04/23/2012  . Preventative health care 01/08/2012  . Diarrhea 10/07/2011  . CKD (chronic kidney disease) stage 4, GFR 15-29 ml/min ([facility]) 09/30/2011  .  Unspecified disorder of liver 10/22/2010  . Menopausal state 10/22/2010  . Schatzki's ring 10/22/2010  . Hip pain, left 07/23/2010  . PSVT 05/03/2009  . OSTEOARTHRITIS 02/19/2009  . ASYMPTOMATIC POSTMENOPAUSAL STATUS 07/17/2008  . LEUKOPENIA, MILD 05/30/2008  . SKIN RASH 07/02/2007  . Impaired glucose tolerance 07/02/2007  . Hyperlipidemia 03/09/2007  . Iron deficiency anemia 03/09/2007  . MITRAL VALVE PROLAPSE 03/09/2007  . Allergic rhinitis 03/09/2007  . COPD (chronic obstructive pulmonary disease) ([place]) 03/09/2007  . History of colonic polyps 03/09/2007  . Asthma 01/18/2007  . Cough 01/18/2007  . Essential hypertension 11/12/2006    Past Surgical History:  Procedure Laterality Date  . ABDOMINAL HYSTERECTOMY    . CARDIAC CATHETERIZATION  1992   S/P in 1992 at [facility] in [place]. This was negative for any coronary artery disease  . carotid duplex  08/29/2003  . CATARACT EXTRACTION    . ceasarean    . ENTEROSCOPY N/A 10/10/2013   Procedure: ENTEROSCOPY;  Surgeon: Inda Castle, MD;  Location: [facility] ENDOSCOPY;  Service: Endoscopy;  Laterality: N/A;  . ENTEROSCOPY N/A 12/28/2014   Procedure: ENTEROSCOPY;  Surgeon: Inda Castle, MD;  Location: [facility] ENDOSCOPY;  Service: Endoscopy;  Laterality: N/A;  . HOT HEMOSTASIS N/A 12/28/2014   Procedure: HOT HEMOSTASIS (ARGON PLASMA COAGULATION/BICAP);  Surgeon: Inda Castle, MD;  Location: [facility];  Service: Endoscopy;  Laterality: N/A;  . LEFT HEART CATHETERIZATION WITH CORONARY ANGIOGRAM N/A 04/23/2012   Procedure: LEFT HEART CATHETERIZATION WITH CORONARY ANGIOGRAM;  Surgeon: Larey Dresser, MD;  Location[facility] CATH LAB;  Service: Cardiovascular;  Laterality: N/A;  . TONSILLECTOMY    . TUBAL LIGATION      OB History    No data available       Home Medications    Prior to Admission medications   Medication Sig Start Date End Date Taking? Authorizing Provider  acetaminophen (TYLENOL) 500 MG tablet  Take 2 tablets (1,000 mg total) by mouth 2 (two) times daily. 10/05/15  Yes Biagio Borg, MD  aspirin EC 81 MG tablet Take 81 mg by mouth every morning.  10/12/12  Yes Scott T Kathlen Mody, PA-C  atorvastatin (LIPITOR) 20 MG tablet TAKE 1 TABLET (20 MG TOTAL) BY MOUTH DAILY. 12/28/15  Yes Biagio Borg, MD  ferrous sulfate 325 (65 FE) MG tablet Take 325 mg by mouth 2 (two) times daily with a meal.    Yes Historical Provider, MD  metoprolol succinate (TOPROL-XL) 50 MG 24 hr tablet TAKE 1 TABLET (50 MG TOTAL) BY MOUTH DAILY. 11/26/15  Yes Biagio Borg, MD  Multiple Vitamin (MULTIVITAMIN) tablet Take 1 tablet by mouth daily.   Yes Historical Provider, MD  nitroGLYCERIN (NITROSTAT) 0.4 MG SL tablet Place 0.4 mg under the tongue every 5 (five) minutes as needed for chest pain.  Historical Provider, MD    Family History Family History  Problem Relation Age of Onset  . Stomach cancer Mother   . Sarcoidosis Sister   . Colon cancer Brother   . Sarcoidosis Other   . CAD Neg Hx     Social History Social History  Substance Use Topics  . Smoking status: Never Smoker  . Smokeless tobacco: Never Used  . Alcohol use No     Allergies   Doxycycline; Biaxin [clarithromycin]; and Metronidazole   Review of Systems Review of Systems 10 Systems reviewed and are negative for acute change except as noted in the HPI.   Physical Exam Updated Vital Signs BP (!) 150/51 (BP Location: Right Leg)   Pulse 94   Temp 98.6 F (37 C) (Oral)   Resp 18   Ht 5\' 4"  (1.626 m)   Wt 115 lb 6.4 oz (52.3 kg)   SpO2 100%   BMI 19.81 kg/m   Physical Exam  Constitutional: She appears well-developed and well-nourished. No distress.  HENT:  Head: Normocephalic and atraumatic.  Eyes: Conjunctivae are normal.  Neck: Neck supple.  Cardiovascular: Normal rate and regular rhythm.   No murmur heard. Pulmonary/Chest: Effort normal and breath sounds normal. No respiratory distress.  Abdominal: Soft. There is no tenderness.    Musculoskeletal: She exhibits no edema, tenderness or deformity.  Neurological: She is alert.  Skin: Skin is warm and dry. There is pallor.  Psychiatric: She has a normal mood and affect.  Nursing note and vitals reviewed.    ED Treatments / Results  Labs (all labs ordered are listed, but only abnormal results are displayed) Labs Reviewed  BASIC METABOLIC PANEL - Abnormal; Notable for the following:       Result Value   Sodium 132 (*)    Chloride 100 (*)    Glucose, Bld 105 (*)    Creatinine, Ser 1.04 (*)    GFR calc non Af Amer 49 (*)    GFR calc Af Amer 57 (*)    All other components within normal limits  CBC - Abnormal; Notable for the following:    WBC 11.9 (*)    RBC 2.23 (*)    Hemoglobin 6.6 (*)    HCT 21.1 (*)    RDW 15.8 (*)    All other components within normal limits  URINALYSIS, ROUTINE W REFLEX MICROSCOPIC (NOT AT [facility]) - Abnormal; Notable for the following:    Hgb urine dipstick TRACE (*)    All other components within normal limits  VITAMIN B12 - Abnormal; Notable for the following:    Vitamin B-12 1,097 (*)    All other components within normal limits  RETICULOCYTES - Abnormal; Notable for the following:    Retic Ct Pct 5.0 (*)    RBC. 2.23 (*)    All other components within normal limits  URINE MICROSCOPIC-ADD ON - Abnormal; Notable for the following:    Squamous Epithelial / LPF 0-5 (*)    All other components within normal limits  COMPREHENSIVE METABOLIC PANEL - Abnormal; Notable for the following:    Glucose, Bld 106 (*)    BUN 21 (*)    Albumin 3.4 (*)    ALT 13 (*)    Total Bilirubin 1.3 (*)    All other components within normal limits  CBC - Abnormal; Notable for the following:    RBC 3.42 (*)    Hemoglobin 10.0 (*)    HCT 30.7 (*)    RDW 15.7 (*)  All other components within normal limits  FOLATE  IRON AND TIBC  FERRITIN  PROTIME-INR  TROPONIN I  I-STAT TROPOININ, ED  POC OCCULT BLOOD, ED  PREPARE RBC (CROSSMATCH)  TYPE AND  SCREEN    EKG  EKG Interpretation  Date/Time:  Saturday January 19 2016 12:38:39 EDT Ventricular Rate:  93 PR Interval:  150 QRS Duration: 94 QT Interval:  378 QTC Calculation: 469 R Axis:   68 Text Interpretation:  Sinus rhythm with Premature atrial complexes Otherwise normal ECG agree. no sig change from previos Confirmed by Johnney Killian, MD, Jeannie Done [phone]) on 01/19/2016 1:01:29 PM       Radiology Dg Chest 2 View  Result Date: 01/19/2016 CLINICAL DATA:  Weakness and fatigue EXAM: CHEST  2 VIEW COMPARISON:  05/10/2014 FINDINGS: Cardiac shadow is mildly enlarged. The thoracic aorta is tortuous but stable. The lungs are well aerated bilaterally. No focal infiltrate or sizable effusion is seen. No acute bony abnormality is noted. IMPRESSION: No acute abnormality seen. Electronically Signed   By: Inez Catalina M.D.   On: 01/19/2016 14:49    Procedures Procedures (including critical care time) CRITICAL CARE Performed by: Charlesetta Shanks   Total critical care time: 30  minutes  Critical care time was exclusive of separately billable procedures and treating other patients.  Critical care was necessary to treat or prevent imminent or life-threatening deterioration.  Critical care was time spent personally by me on the following activities: development of treatment plan with patient and/or surrogate as well as nursing, discussions with consultants, evaluation of patient's response to treatment, examination of patient, obtaining history from patient or surrogate, ordering and performing treatments and interventions, ordering and review of laboratory studies, ordering and review of radiographic studies, pulse oximetry and re-evaluation of patient's condition. Medications Ordered in ED Medications  atorvastatin (LIPITOR) tablet 20 mg (20 mg Oral Given 01/19/16 1754)  metoprolol succinate (TOPROL-XL) 24 hr tablet 50 mg (50 mg Oral Not Given 01/19/16 1717)  acetaminophen (TYLENOL) tablet 1,000  mg (1,000 mg Oral Not Given 01/19/16 2200)  ferrous sulfate tablet 325 mg (325 mg Oral Given 01/19/16 1754)  acetaminophen (TYLENOL) tablet 650 mg (not administered)    Or  acetaminophen (TYLENOL) suppository 650 mg (not administered)  HYDROcodone-acetaminophen (NORCO/VICODIN) 5-325 MG per tablet 1-2 tablet (not administered)  traZODone (DESYREL) tablet 25 mg (not administered)  senna-docusate (Senokot-S) tablet 1 tablet (not administered)  bisacodyl (DULCOLAX) suppository 10 mg (not administered)  magnesium citrate solution 1 Bottle (not administered)  ondansetron (ZOFRAN) tablet 4 mg (not administered)    Or  ondansetron (ZOFRAN) injection 4 mg (not administered)  sodium chloride flush (NS) 0.9 % injection 3 mL (3 mLs Intravenous Not Given 01/19/16 2220)  sodium chloride 0.9 % bolus 250 mL (250 mLs Intravenous Not Given 01/19/16 2245)  0.9 %  sodium chloride infusion (10 mL/hr Intravenous New Bag/Given 01/19/16 1720)     Initial Impression / Assessment and Plan / ED Course  I have reviewed the triage vital signs and the nursing notes.  Pertinent labs & imaging results that were available during my care of the patient were reviewed by me and considered in my medical decision making (see chart for details).  Clinical Course  Consult: [facility] hospitalist for admission.  Final Clinical Impressions(s) / ED Diagnoses   Final diagnoses:  Palpitations  Symptomatic anemia  Patient presents with chief complaint of severe fatigue and generalized weakness. She reports history of recurrent anemia of unknown etiology. She reports she has had extensive diagnostic  workup but the source of blood loss is unknown. The patient had palpitations yesterday but none today. No chest pain or dyspnea. At this time, no evidence of acute ischemic cardiac episode and no atrial fibrillation. Blood is ordered for transfusion of symptomatic anemia with planned admission.  New Prescriptions Current Discharge  Medication List       Charlesetta Shanks, MD 01/20/16 [phone]

## 2016-01-19 NOTE — Progress Notes (Signed)
DrKim phoned me.  Wants her to have a bolus of 250 after blood is infusing.  Was perplexed as to why patient was sent here instead step down. Will continue to monitor

## 2016-01-19 NOTE — ED Notes (Signed)
CRITICAL VALUE ALERT  Critical value received:  HGB 6.6  Date of notification:  01/19/2016  Time of notification:  8441  Critical value read back:Yes.    Nurse who received alert:  Shon Millet RN  MD notified (1st page):  Dr. Johnney Killian  Time of first page:  [phone]  MD notified (2nd page):  Time of second page:  Responding MD:  Dr. Johnney Killian  Time MD responded:  [phone]

## 2016-01-19 NOTE — ED Notes (Signed)
Family at bedside. 

## 2016-01-19 NOTE — ED Notes (Signed)
Colletta Maryland, EMT attempted to do an IV and she refused to be stuck.  I advised her she needed an IV and she started to cry saying she is a hard stick and only the IV team can get the IV.  I placed a consult to IV team and explained to them she refused for [place] to even try.

## 2016-01-20 ENCOUNTER — Encounter (HOSPITAL_COMMUNITY): Payer: Self-pay

## 2016-01-20 ENCOUNTER — Encounter (HOSPITAL_COMMUNITY)
Admission: EM | Disposition: A | Discharge status: Home / Self Care | Payer: Self-pay | Source: Home / Self Care | Attending: Family Medicine

## 2016-01-20 ENCOUNTER — Observation Stay (HOSPITAL_BASED_OUTPATIENT_CLINIC_OR_DEPARTMENT_OTHER): Payer: Medicare Other

## 2016-01-20 DIAGNOSIS — Z86010 Personal history of colonic polyps: Secondary | ICD-10-CM

## 2016-01-20 DIAGNOSIS — M810 Age-related osteoporosis without current pathological fracture: Secondary | ICD-10-CM | POA: Diagnosis present

## 2016-01-20 DIAGNOSIS — I959 Hypotension, unspecified: Secondary | ICD-10-CM | POA: Diagnosis present

## 2016-01-20 DIAGNOSIS — K31811 Angiodysplasia of stomach and duodenum with bleeding: Secondary | ICD-10-CM | POA: Diagnosis not present

## 2016-01-20 DIAGNOSIS — M797 Fibromyalgia: Secondary | ICD-10-CM | POA: Diagnosis present

## 2016-01-20 DIAGNOSIS — E869 Volume depletion, unspecified: Secondary | ICD-10-CM | POA: Diagnosis present

## 2016-01-20 DIAGNOSIS — K5521 Angiodysplasia of colon with hemorrhage: Secondary | ICD-10-CM | POA: Diagnosis present

## 2016-01-20 DIAGNOSIS — K222 Esophageal obstruction: Secondary | ICD-10-CM

## 2016-01-20 DIAGNOSIS — Q2733 Arteriovenous malformation of digestive system vessel: Secondary | ICD-10-CM | POA: Diagnosis not present

## 2016-01-20 DIAGNOSIS — N170 Acute kidney failure with tubular necrosis: Secondary | ICD-10-CM | POA: Diagnosis not present

## 2016-01-20 DIAGNOSIS — D509 Iron deficiency anemia, unspecified: Secondary | ICD-10-CM | POA: Diagnosis present

## 2016-01-20 DIAGNOSIS — N179 Acute kidney failure, unspecified: Secondary | ICD-10-CM | POA: Diagnosis present

## 2016-01-20 DIAGNOSIS — I350 Nonrheumatic aortic (valve) stenosis: Secondary | ICD-10-CM

## 2016-01-20 DIAGNOSIS — Z7982 Long term (current) use of aspirin: Secondary | ICD-10-CM | POA: Diagnosis not present

## 2016-01-20 DIAGNOSIS — N189 Chronic kidney disease, unspecified: Secondary | ICD-10-CM

## 2016-01-20 DIAGNOSIS — I2510 Atherosclerotic heart disease of native coronary artery without angina pectoris: Secondary | ICD-10-CM | POA: Diagnosis present

## 2016-01-20 DIAGNOSIS — K558 Other vascular disorders of intestine: Secondary | ICD-10-CM | POA: Diagnosis not present

## 2016-01-20 DIAGNOSIS — D500 Iron deficiency anemia secondary to blood loss (chronic): Secondary | ICD-10-CM | POA: Diagnosis not present

## 2016-01-20 DIAGNOSIS — D649 Anemia, unspecified: Secondary | ICD-10-CM | POA: Diagnosis not present

## 2016-01-20 DIAGNOSIS — K922 Gastrointestinal hemorrhage, unspecified: Secondary | ICD-10-CM

## 2016-01-20 DIAGNOSIS — R531 Weakness: Secondary | ICD-10-CM | POA: Diagnosis not present

## 2016-01-20 DIAGNOSIS — K219 Gastro-esophageal reflux disease without esophagitis: Secondary | ICD-10-CM | POA: Diagnosis present

## 2016-01-20 DIAGNOSIS — I252 Old myocardial infarction: Secondary | ICD-10-CM | POA: Diagnosis not present

## 2016-01-20 DIAGNOSIS — I5032 Chronic diastolic (congestive) heart failure: Secondary | ICD-10-CM | POA: Diagnosis present

## 2016-01-20 DIAGNOSIS — I4891 Unspecified atrial fibrillation: Secondary | ICD-10-CM | POA: Diagnosis not present

## 2016-01-20 DIAGNOSIS — Z800 Family history of malignant neoplasm of digestive organs: Secondary | ICD-10-CM | POA: Diagnosis not present

## 2016-01-20 DIAGNOSIS — F411 Generalized anxiety disorder: Secondary | ICD-10-CM | POA: Diagnosis not present

## 2016-01-20 DIAGNOSIS — I130 Hypertensive heart and chronic kidney disease with heart failure and stage 1 through stage 4 chronic kidney disease, or unspecified chronic kidney disease: Secondary | ICD-10-CM | POA: Diagnosis present

## 2016-01-20 DIAGNOSIS — D62 Acute posthemorrhagic anemia: Secondary | ICD-10-CM | POA: Diagnosis not present

## 2016-01-20 DIAGNOSIS — N184 Chronic kidney disease, stage 4 (severe): Secondary | ICD-10-CM | POA: Diagnosis present

## 2016-01-20 DIAGNOSIS — M1990 Unspecified osteoarthritis, unspecified site: Secondary | ICD-10-CM | POA: Diagnosis present

## 2016-01-20 DIAGNOSIS — E785 Hyperlipidemia, unspecified: Secondary | ICD-10-CM | POA: Diagnosis present

## 2016-01-20 HISTORY — PX: ENTEROSCOPY: SHX5533

## 2016-01-20 LAB — TYPE AND SCREEN
ABO/RH(D): B NEG
ANTIBODY SCREEN: NEGATIVE
UNIT DIVISION: 0
Unit division: 0

## 2016-01-20 LAB — CBC WITH DIFFERENTIAL/PLATELET
Basophils Absolute: 0 10*3/uL (ref 0.0–0.1)
Basophils Relative: 0 %
EOS PCT: 0 %
Eosinophils Absolute: 0 10*3/uL (ref 0.0–0.7)
HCT: 32.8 % — ABNORMAL LOW (ref 36.0–46.0)
HEMOGLOBIN: 10.8 g/dL — AB (ref 12.0–15.0)
LYMPHS ABS: 1.2 10*3/uL (ref 0.7–4.0)
LYMPHS PCT: 7 %
MCH: 29.8 pg (ref 26.0–34.0)
MCHC: 32.9 g/dL (ref 30.0–36.0)
MCV: 90.6 fL (ref 78.0–100.0)
MONOS PCT: 9 %
Monocytes Absolute: 1.5 10*3/uL — ABNORMAL HIGH (ref 0.1–1.0)
NEUTROS PCT: 84 %
Neutro Abs: 13.6 10*3/uL — ABNORMAL HIGH (ref 1.7–7.7)
Platelets: 307 10*3/uL (ref 150–400)
RBC: 3.62 MIL/uL — AB (ref 3.87–5.11)
RDW: 17.2 % — ABNORMAL HIGH (ref 11.5–15.5)
WBC: 16.4 10*3/uL — AB (ref 4.0–10.5)

## 2016-01-20 LAB — HEMOGLOBIN AND HEMATOCRIT, BLOOD
HCT: 30 % — ABNORMAL LOW (ref 36.0–46.0)
Hemoglobin: 10 g/dL — ABNORMAL LOW (ref 12.0–15.0)

## 2016-01-20 LAB — COMPREHENSIVE METABOLIC PANEL
ALT: 13 U/L — ABNORMAL LOW (ref 14–54)
AST: 18 U/L (ref 15–41)
Albumin: 3.4 g/dL — ABNORMAL LOW (ref 3.5–5.0)
Alkaline Phosphatase: 54 U/L (ref 38–126)
Anion gap: 8 (ref 5–15)
BUN: 21 mg/dL — ABNORMAL HIGH (ref 6–20)
CO2: 23 mmol/L (ref 22–32)
Calcium: 9 mg/dL (ref 8.9–10.3)
Chloride: 104 mmol/L (ref 101–111)
Creatinine, Ser: 0.88 mg/dL (ref 0.44–1.00)
GFR calc Af Amer: >60 mL/min (ref >60)
GFR calc non Af Amer: >60 mL/min (ref >60)
Glucose, Bld: 106 mg/dL — ABNORMAL HIGH (ref 65–99)
Potassium: 3.9 mmol/L (ref 3.5–5.1)
Sodium: 135 mmol/L (ref 135–145)
Total Bilirubin: 1.3 mg/dL — ABNORMAL HIGH (ref 0.3–1.2)
Total Protein: 6.8 g/dL (ref 6.5–8.1)

## 2016-01-20 LAB — ECHOCARDIOGRAM COMPLETE
Height: 64 in
Weight: 1846.4 oz

## 2016-01-20 LAB — CBC
HCT: 30.7 % — ABNORMAL LOW (ref 36.0–46.0)
HCT: 21.1 % — ABNORMAL LOW (ref 36.0–46.0)
Hemoglobin: 10 g/dL — ABNORMAL LOW (ref 12.0–15.0)
Hemoglobin: 6.6 g/dL — CL (ref 12.0–15.0)
MCH: 29.2 pg (ref 26.0–34.0)
MCH: 29.6 pg (ref 26.0–34.0)
MCHC: 32.6 g/dL (ref 30.0–36.0)
MCHC: 31.3 g/dL (ref 30.0–36.0)
MCV: 89.8 fL (ref 78.0–100.0)
MCV: 94.6 fL (ref 78.0–100.0)
PLATELETS: 384 10*3/uL (ref 150–400)
Platelets: 286 10*3/uL (ref 150–400)
RBC: 3.42 MIL/uL — ABNORMAL LOW (ref 3.87–5.11)
RBC: 2.23 MIL/uL — ABNORMAL LOW (ref 3.87–5.11)
RDW: 15.7 % — ABNORMAL HIGH (ref 11.5–15.5)
RDW: 15.8 % — AB (ref 11.5–15.5)
WBC: 7.8 10*3/uL (ref 4.0–10.5)
WBC: 11.9 10*3/uL — AB (ref 4.0–10.5)

## 2016-01-20 LAB — PROTIME-INR
INR: 1.06
Prothrombin Time: 13.9 seconds (ref 11.4–15.2)

## 2016-01-20 LAB — TROPONIN I

## 2016-01-20 SURGERY — ENTEROSCOPY
Anesthesia: Moderate Sedation

## 2016-01-20 MED ORDER — MIDAZOLAM HCL 5 MG/ML IJ SOLN
INTRAMUSCULAR | Status: AC
  Filled 2016-01-20: qty 2

## 2016-01-20 MED ORDER — EPINEPHRINE PF 1 MG/ML IJ SOLN
INTRAMUSCULAR | Status: DC | PRN
  Administered 2016-01-20: 5 mL

## 2016-01-20 MED ORDER — DIPHENHYDRAMINE HCL 50 MG/ML IJ SOLN
INTRAMUSCULAR | Status: DC | PRN
  Administered 2016-01-20: 25 mg via INTRAVENOUS

## 2016-01-20 MED ORDER — WHITE PETROLATUM GEL
Status: AC
  Filled 2016-01-20: qty 1

## 2016-01-20 MED ORDER — MIDAZOLAM HCL 10 MG/2ML IJ SOLN
INTRAMUSCULAR | Status: DC | PRN
  Administered 2016-01-20: 1 mg via INTRAVENOUS
  Administered 2016-01-20 (×3): 2 mg via INTRAVENOUS

## 2016-01-20 MED ORDER — FENTANYL CITRATE (PF) 100 MCG/2ML IJ SOLN
INTRAMUSCULAR | Status: DC | PRN
  Administered 2016-01-20: 25 ug via INTRAVENOUS
  Administered 2016-01-20: 12.5 ug via INTRAVENOUS

## 2016-01-20 MED ORDER — DIPHENHYDRAMINE HCL 50 MG/ML IJ SOLN
INTRAMUSCULAR | Status: AC
  Filled 2016-01-20: qty 1

## 2016-01-20 MED ORDER — FENTANYL CITRATE (PF) 100 MCG/2ML IJ SOLN
INTRAMUSCULAR | Status: AC
  Filled 2016-01-20: qty 2

## 2016-01-20 NOTE — Interval H&P Note (Signed)
History and Physical Interval Note:  01/20/2016 4:54 PM  Zani B. Dittman  has presented today for surgery, with the diagnosis of anemia  The various methods of treatment have been discussed with the patient and family. After consideration of risks, benefits and other options for treatment, the patient has consented to  Procedure(s): ENTEROSCOPY (N/A) as a surgical intervention .  The patient's history has been reviewed, patient examined, no change in status, stable for surgery.  I have reviewed the patient's chart and labs.  Questions were answered to the patient's satisfaction.     Renelda Loma Armbruster

## 2016-01-20 NOTE — Consult Note (Signed)
Referring Provider: [facility] Primary Care Physician:  Cathlean Cower, MD Primary Gastroenterologist:  Formerly Alben Spittle, MD.  Reason for Consultation:  Symptomatic anemia  HPI: Lindsay Doyle is a 82 y.o. female with multiple medical problems known to [place] for history of anemia and intestinal AMV. Patient has been having palpitations at home, increased Toprol as advised by Cardiology. She presented to ED with weakness and found to be severely anemic. Compliant with oral iron. No overt GI bleeding. Stools always black on iron. No bowel changes. No nausea. Takes a baby asa daily but no other NSAIDS  Past Medical History:  Diagnosis Date  . ANEMIA-IRON DEFICIENCY    presumed GI bleed 08/2012- anticoag stopped  . Asthma    per pt, she does not have asthma  . Atrial fibrillation ([place])    a. Apixaban started 03/2012 - stopped after admx for GI bleed 6/14  . Blood transfusion    a. 05/2010: 3 transfusions for anemia/GIB.  [name] CAD (coronary artery disease)    a. LHC 1/14:  mLAD serial 30 and 60%, oRCA 30%, EF 60%, Afib,   . Cataract   . COLONIC POLYPS, HX OF 03/09/2007  . COPD 03/09/2007   ? pt is unsure  . GERD (gastroesophageal reflux disease)   . GI bleed    a. 05/2010: Hgb 7 at outside hospital s/p 3 u PRBC, EGD 05/2010 mild gastritis, small hiatal hernia, Schatzki ring, negative colonoscopy; capsule endoscopy 06/2010 - possible nonspecific inflammatory changes.;  b.  08/2012: 1/2 guiac +; required transfusion - Apixaban d/c'd  . Hx of echocardiogram    a. echo 2/11:  EF 55-60%, mild AI, mild RAE, mild to mod TR;   b.  a. Echo 2/14:  Mild focal basal and mild LVH, EF 60-65%, mild AI, mild RAE, trivia effusion  . HYPERGLYCEMIA 07/02/2007  . Hyperlipidemia   . Hypertension 11/12/2006  . LEUKOPENIA, MILD 05/30/2008  . Mitral valve prolapse    Remotely - last echo 2011 did not show this  . NSTEMI (non-ST elevated myocardial infarction) ([place]) 03/2012   a. Type II in setting of AF with RVR 03/2012   . OSTEOARTHRITIS 02/19/2009   Spinal  . OSTEOPOROSIS 11/12/2006  . Positive H. pylori test 10/1996  . PSVT    a. Remotely, in setting of anemia.  [name] Spinal stenosis of lumbar region 07/16/2012  . Transfusion history    7/15     Past Surgical History:  Procedure Laterality Date  . ABDOMINAL HYSTERECTOMY    . CARDIAC CATHETERIZATION  1992   S/P in 1992 at [facility] in [place]. This was negative for any coronary artery disease  . carotid duplex  08/29/2003  . CATARACT EXTRACTION    . ceasarean    . ENTEROSCOPY N/A 10/10/2013   Procedure: ENTEROSCOPY;  Surgeon: Inda Castle, MD;  Location: [facility] ENDOSCOPY;  Service: Endoscopy;  Laterality: N/A;  . ENTEROSCOPY N/A 12/28/2014   Procedure: ENTEROSCOPY;  Surgeon: Inda Castle, MD;  Location: [facility] ENDOSCOPY;  Service: Endoscopy;  Laterality: N/A;  . HOT HEMOSTASIS N/A 12/28/2014   Procedure: HOT HEMOSTASIS (ARGON PLASMA COAGULATION/BICAP);  Surgeon: Inda Castle, MD;  Location: [facility];  Service: Endoscopy;  Laterality: N/A;  . LEFT HEART CATHETERIZATION WITH CORONARY ANGIOGRAM N/A 04/23/2012   Procedure: LEFT HEART CATHETERIZATION WITH CORONARY ANGIOGRAM;  Surgeon: Larey Dresser, MD;  Location: [facility] CATH LAB;  Service: Cardiovascular;  Laterality: N/A;  . TONSILLECTOMY    .  TUBAL LIGATION      Prior to Admission medications   Medication Sig Start Date End Date Taking? Authorizing Provider  acetaminophen (TYLENOL) 500 MG tablet Take 2 tablets (1,000 mg total) by mouth 2 (two) times daily. 10/05/15  Yes Biagio Borg, MD  aspirin EC 81 MG tablet Take 81 mg by mouth every morning.  10/12/12  Yes Scott T Kathlen Mody, PA-C  atorvastatin (LIPITOR) 20 MG tablet TAKE 1 TABLET (20 MG TOTAL) BY MOUTH DAILY. 12/28/15  Yes Biagio Borg, MD  ferrous sulfate 325 (65 FE) MG tablet Take 325 mg by mouth 2 (two) times daily with a meal.    Yes Historical Provider, MD  metoprolol succinate (TOPROL-XL) 50 MG 24 hr tablet TAKE 1 TABLET (50  MG TOTAL) BY MOUTH DAILY. 11/26/15  Yes Biagio Borg, MD  Multiple Vitamin (MULTIVITAMIN) tablet Take 1 tablet by mouth daily.   Yes Historical Provider, MD  nitroGLYCERIN (NITROSTAT) 0.4 MG SL tablet Place 0.4 mg under the tongue every 5 (five) minutes as needed for chest pain.    Historical Provider, MD    Current Facility-Administered Medications  Medication Dose Route Frequency Provider Last Rate Last Dose  . acetaminophen (TYLENOL) tablet 650 mg  650 mg Oral Q6H PRN Rondel Jumbo, PA-C       Or  . acetaminophen (TYLENOL) suppository 650 mg  650 mg Rectal Q6H PRN Rondel Jumbo, PA-C      . acetaminophen (TYLENOL) tablet 1,000 mg  1,000 mg Oral BID Rondel Jumbo, PA-C      . atorvastatin (LIPITOR) tablet 20 mg  20 mg Oral q1800 Rondel Jumbo, PA-C   20 mg at 01/19/16 1754  . bisacodyl (DULCOLAX) suppository 10 mg  10 mg Rectal Daily PRN Rondel Jumbo, PA-C      . ferrous sulfate tablet 325 mg  325 mg Oral BID WC Rondel Jumbo, PA-C   325 mg at 01/19/16 1754  . HYDROcodone-acetaminophen (NORCO/VICODIN) 5-325 MG per tablet 1-2 tablet  1-2 tablet Oral Q4H PRN Rondel Jumbo, PA-C      . magnesium citrate solution 1 Bottle  1 Bottle Oral Once PRN Rondel Jumbo, PA-C      . metoprolol succinate (TOPROL-XL) 24 hr tablet 50 mg  50 mg Oral Daily Rondel Jumbo, PA-C      . ondansetron [facility]) tablet 4 mg  4 mg Oral Q6H PRN Rondel Jumbo, PA-C       Or  . ondansetron [facility]) injection 4 mg  4 mg Intravenous Q6H PRN Rondel Jumbo, PA-C      . senna-docusate (Senokot-S) tablet 1 tablet  1 tablet Oral QHS PRN Rondel Jumbo, PA-C      . sodium chloride 0.9 % bolus 250 mL  250 mL Intravenous Once Jani Gravel, MD      . sodium chloride flush (NS) 0.9 % injection 3 mL  3 mL Intravenous Q12H Rondel Jumbo, PA-C   3 mL at 01/19/16 1718  . traZODone (DESYREL) tablet 25 mg  25 mg Oral QHS PRN Rondel Jumbo, PA-C        Allergies as of 01/19/2016 - Review Complete 01/19/2016  Allergen Reaction  Noted  . Doxycycline Diarrhea 03/23/2013  . Biaxin [clarithromycin] Other (See Comments) 01/18/2007  . Metronidazole Other (See Comments) 10/08/2011    Family History  Problem Relation Age of Onset  . Stomach cancer Mother   . Sarcoidosis Sister   .  Colon cancer Brother   . Sarcoidosis Other   . CAD Neg Hx     Social History   Social History  . Marital status: Married    Spouse name: N/A  . Number of children: 1  . Years of education: N/A   Occupational History  .      retired   Social History Main Topics  . Smoking status: Never Smoker  . Smokeless tobacco: Never Used  . Alcohol use No  . Drug use: No  . Sexual activity: Not on file    Review of Systems: All systems reviewed and negative except where noted in HPI.  Physical Exam: Vital signs in last 24 hours: Temp:  [98.3 F (36.8 C)-99.4 F (37.4 C)] 98.6 F (37 C) (10/22 0015) Pulse Rate:  [36-103] 94 (10/22 0114) Resp:  [14-22] 18 (10/22 0015) BP: (63-164)/(26-93) 150/51 (10/22 0114) SpO2:  [87 %-100 %] 100 % (10/22 0114) Weight:  [115 lb 6.4 oz (52.3 kg)] 115 lb 6.4 oz (52.3 kg) (10/21 1650) Last BM Date: 01/19/16 General:   Alert, thin black female in NAD Head:  Normocephalic and atraumatic. Eyes:  Sclera clear, no icterus.   Conjunctiva pink. Ears:  Normal auditory acuity. Nose:  No deformity, discharge,  or lesions.  Neck:  Supple; no masses  Lungs:  Clear throughout to auscultation.   No wheezes, crackles, or rhonchi.  Heart:  Regular rate and rhythm; no murmurs, clicks, rubs,  or gallops. Abdomen:  Soft,nontender, BS active,nonpalp mass or hsm.   Msk:  Symmetrical without gross deformities. . Extremities:  Without clubbing or edema. Neurologic:  Alert and  oriented x4;  grossly normal neurologically. Skin:  Intact without significant lesions or rashes.. Psych:  Alert and cooperative. Normal mood and affect.  Intake/Output from previous day: 10/21 0701 - 10/22 0700 In: 1694.5 [I.V.:46;  Blood:1648.5] Out: 800 [Urine:800] Intake/Output this shift: No intake/output data recorded.  Lab Results:  Recent Labs  01/19/16 1248 01/20/16 0057  WBC 11.9* 7.8  HGB 6.6* 10.0*  HCT 21.1* 30.7*  PLT 384 286   BMET  Recent Labs  01/19/16 1248 01/20/16 0057  NA 132* 135  K 4.9 3.9  CL 100* 104  CO2 24 23  GLUCOSE 105* 106*  BUN 20 21*  CREATININE 1.04* 0.88  CALCIUM 9.4 9.0   LFT  Recent Labs  01/20/16 0057  PROT 6.8  ALBUMIN 3.4*  AST 18  ALT 13*  ALKPHOS 54  BILITOT 1.3*   PT/INR  Recent Labs  01/20/16 0057  LABPROT 13.9  INR 1.06   Studies/Results: Dg Chest 2 View  Result Date: 01/19/2016 CLINICAL DATA:  Weakness and fatigue EXAM: CHEST  2 VIEW COMPARISON:  05/10/2014 FINDINGS: Cardiac shadow is mildly enlarged. The thoracic aorta is tortuous but stable. The lungs are well aerated bilaterally. No focal infiltrate or sizable effusion is seen. No acute bony abnormality is noted. IMPRESSION: No acute abnormality seen. Electronically Signed   By: Inez Catalina M.D.   On: 01/19/2016 14:49    IMPRESSION / PLAN:   31. 82 year old female with acute on chronic normocytic anemia. Ferritin low normal. Hgb 10.6 in early July, now at 6.6. No overt bleeding but stools always black on iron. Received 2 units of blood. Normal complete colonoscopy 2012.  -Small bowel enteroscopy today. Risks and benefits of the procedure were discussed with the patient and she agrees to proceed.  -as outpatient she will need to continue BID iron and have H+H monthly. [name]  2. A-Fib . Rate controlled. On Metoprolol.   3. CAD / HTN. On daily baby ASA   Tye Savoy  01/20/2016, 9:41 AM  Pager number [phone]

## 2016-01-20 NOTE — Progress Notes (Signed)
  Echocardiogram 2D Echocardiogram has been performed.  Lindsay Doyle 01/20/2016, 2:24 PM

## 2016-01-20 NOTE — Progress Notes (Signed)
PROGRESS NOTE    Lindsay Doyle  GGE:[id number] DOB: [birth year] DOA: 01/19/2016 PCP: Cathlean Cower, MD   Brief Narrative: Lindsay Almas. Doyle is a 82 y.o. female with medical history significant for Atrial fibrillation,  Lifelong history of Iron deficiency anemia and colon polyps.   Assessment & Plan:   Active Problems:   Iron deficiency anemia   History of colonic polyps   Schatzki's ring   CKD (chronic kidney disease) stage 4, GFR 15-29 ml/min (HCC)   Atrial fibrillation    Symptomatic anemia   Anxiety state   Gastric AVM   Acute on chronic renal failure (HCC)   Fibromyalgia   Symptomatic anemia Patient with history of GI bleed. Currently taking iron supplementation and has chronically black stools. No hematochezia. CBC 6.6 down from 10.6 in July 2017. S/p 2u PRBC with follow-up hemoglobin of 10. Previous GI workup significant for colon polyps and gastric AVMs. -GI recommendations -CBC in AM -follow-up bowel movements for recurrent bleeding  Atrial fibrillation Sinus rhythm. Rate controlled. Not on anticoagulation secondary to recurrent GI bleeding. CHA2DS2-VASc Score is 5. -continue metoprolol  Hypotension Likely secondary to volume depletion. Improved with boluses  Hyperlipidemia Continue home atorvastatin  Chronic kidney disease stage 4. This appears to no longer be the case. Patient's creatinine was initially slightly elevated so slight AKI that has resolved today and is likely secondary to prerenal azotemia.  CAD Hold aspirin  DVT prophylaxis: SCDs Code Status: Full code Family Communication: None at bedside Disposition Plan: Discharge home in 1-2 days   Consultants:   Gastroenterology  Procedures:   None  Antimicrobials:   None    Subjective: Patient reports no issues overnight. Feels better since receiving blood.  Objective: Vitals:   01/19/16 2316 01/19/16 2321 01/20/16 0015 01/20/16 0114  BP: (!) 79/55 (!) 164/55 97/70 (!) 150/51    Pulse: 94 98 94 94  Resp:  18 18   Temp:  98.6 F (37 C) 98.6 F (37 C)   TempSrc:   Oral   SpO2: 100% 99% 100% 100%  Weight:      Height:        Intake/Output Summary (Last 24 hours) at 01/20/16 1402 Last data filed at 01/20/16 0015  Gross per 24 hour  Intake           1694.5 ml  Output              800 ml  Net            894.5 ml   Filed Weights   01/19/16 1650  Weight: 52.3 kg (115 lb 6.4 oz)    Examination:  General exam: Appears calm and comfortable  Respiratory system: Clear to auscultation. Respiratory effort normal. Cardiovascular system: S1 & S2 heard, RRR. No murmurs, rubs, gallops or clicks. Gastrointestinal system: Abdomen is nondistended, soft and nontender. Normal bowel sounds heard. Central nervous system: Alert and oriented. No focal neurological deficits. Extremities: No edema. No calf tenderness Skin: No cyanosis. No rashes Psychiatry: Judgement and insight appear normal. Mood & affect appropriate.     Data Reviewed: I have personally reviewed following labs and imaging studies  CBC:  Recent Labs Lab 01/19/16 1248 01/20/16 0057 01/20/16 0945  WBC 11.9* 7.8  --   HGB 6.6* 10.0* 10.0*  HCT 21.1* 30.7* 30.0*  MCV 94.6 89.8  --   PLT 384 286  --    Basic Metabolic Panel:  Recent Labs Lab 01/19/16 1248 01/20/16 0057  NA 132* 135  K 4.9 3.9  CL 100* 104  CO2 24 23  GLUCOSE 105* 106*  BUN 20 21*  CREATININE 1.04* 0.88  CALCIUM 9.4 9.0   GFR: Estimated Creatinine Clearance: 42.1 mL/min (by C-G formula based on SCr of 0.88 mg/dL). Liver Function Tests:  Recent Labs Lab 01/20/16 0057  AST 18  ALT 13*  ALKPHOS 54  BILITOT 1.3*  PROT 6.8  ALBUMIN 3.4*   No results for input(s): LIPASE, AMYLASE in the last 168 hours. No results for input(s): AMMONIA in the last 168 hours. Coagulation Profile:  Recent Labs Lab 01/20/16 0057  INR 1.06   Cardiac Enzymes:  Recent Labs Lab 01/20/16 0057  TROPONINI <0.03   BNP (last 3  results) No results for input(s): PROBNP in the last 8760 hours. HbA1C: No results for input(s): HGBA1C in the last 72 hours. CBG: No results for input(s): GLUCAP in the last 168 hours. Lipid Profile: No results for input(s): CHOL, HDL, LDLCALC, TRIG, CHOLHDL, LDLDIRECT in the last 72 hours. Thyroid Function Tests: No results for input(s): TSH, T4TOTAL, FREET4, T3FREE, THYROIDAB in the last 72 hours. Anemia Panel:  Recent Labs  01/19/16 1651 01/19/16 1652  VITAMINB12 1,097*  --   FOLATE  --  48.6  FERRITIN 19  --   TIBC 391  --   IRON 43  --   RETICCTPCT 5.0*  --    Sepsis Labs: No results for input(s): PROCALCITON, LATICACIDVEN in the last 168 hours.  No results found for this or any previous visit (from the past 240 hour(s)).       Radiology Studies: Dg Chest 2 View  Result Date: 01/19/2016 CLINICAL DATA:  Weakness and fatigue EXAM: CHEST  2 VIEW COMPARISON:  05/10/2014 FINDINGS: Cardiac shadow is mildly enlarged. The thoracic aorta is tortuous but stable. The lungs are well aerated bilaterally. No focal infiltrate or sizable effusion is seen. No acute bony abnormality is noted. IMPRESSION: No acute abnormality seen. Electronically Signed   By: Inez Catalina M.D.   On: 01/19/2016 14:49        Scheduled Meds: . acetaminophen  1,000 mg Oral BID  . atorvastatin  20 mg Oral q1800  . ferrous sulfate  325 mg Oral BID WC  . metoprolol succinate  50 mg Oral Daily  . sodium chloride  250 mL Intravenous Once  . sodium chloride flush  3 mL Intravenous Q12H  . white petrolatum       Continuous Infusions:    LOS: 0 days     Cordelia Poche [facility] 01/20/2016, 2:02 PM Pager: [phone]  If 7PM-7AM, please contact night-coverage [email or web address] Password [email or web address] 01/20/2016, 2:02 PM

## 2016-01-20 NOTE — Progress Notes (Signed)
Patient off unit for procedure.

## 2016-01-20 NOTE — H&P (View-Only) (Signed)
Referring Provider: [facility] Primary Care Physician:  Cathlean Cower, MD Primary Gastroenterologist:  Formerly Alben Spittle, MD.  Reason for Consultation:  Symptomatic anemia  HPI: Lindsay Doyle is a 82 y.o. female with multiple medical problems known to [place] for history of anemia and intestinal AMV. Patient has been having palpitations at home, increased Toprol as advised by Cardiology. She presented to ED with weakness and found to be severely anemic. Compliant with oral iron. No overt GI bleeding. Stools always black on iron. No bowel changes. No nausea. Takes a baby asa daily but no other NSAIDS  Past Medical History:  Diagnosis Date  . ANEMIA-IRON DEFICIENCY    presumed GI bleed 08/2012- anticoag stopped  . Asthma    per pt, she does not have asthma  . Atrial fibrillation ([place])    a. Apixaban started 03/2012 - stopped after admx for GI bleed 6/14  . Blood transfusion    a. 05/2010: 3 transfusions for anemia/GIB.  [name] CAD (coronary artery disease)    a. LHC 1/14:  mLAD serial 30 and 60%, oRCA 30%, EF 60%, Afib,   . Cataract   . COLONIC POLYPS, HX OF 03/09/2007  . COPD 03/09/2007   ? pt is unsure  . GERD (gastroesophageal reflux disease)   . GI bleed    a. 05/2010: Hgb 7 at outside hospital s/p 3 u PRBC, EGD 05/2010 mild gastritis, small hiatal hernia, Schatzki ring, negative colonoscopy; capsule endoscopy 06/2010 - possible nonspecific inflammatory changes.;  b.  08/2012: 1/2 guiac +; required transfusion - Apixaban d/c'd  . Hx of echocardiogram    a. echo 2/11:  EF 55-60%, mild AI, mild RAE, mild to mod TR;   b.  a. Echo 2/14:  Mild focal basal and mild LVH, EF 60-65%, mild AI, mild RAE, trivia effusion  . HYPERGLYCEMIA 07/02/2007  . Hyperlipidemia   . Hypertension 11/12/2006  . LEUKOPENIA, MILD 05/30/2008  . Mitral valve prolapse    Remotely - last echo 2011 did not show this  . NSTEMI (non-ST elevated myocardial infarction) ([place]) 03/2012   a. Type II in setting of AF with RVR 03/2012   . OSTEOARTHRITIS 02/19/2009   Spinal  . OSTEOPOROSIS 11/12/2006  . Positive H. pylori test 10/1996  . PSVT    a. Remotely, in setting of anemia.  [name] Spinal stenosis of lumbar region 07/16/2012  . Transfusion history    7/15     Past Surgical History:  Procedure Laterality Date  . ABDOMINAL HYSTERECTOMY    . CARDIAC CATHETERIZATION  1992   S/P in 1992 at [facility] in [place]. This was negative for any coronary artery disease  . carotid duplex  08/29/2003  . CATARACT EXTRACTION    . ceasarean    . ENTEROSCOPY N/A 10/10/2013   Procedure: ENTEROSCOPY;  Surgeon: Inda Castle, MD;  Location: [facility] ENDOSCOPY;  Service: Endoscopy;  Laterality: N/A;  . ENTEROSCOPY N/A 12/28/2014   Procedure: ENTEROSCOPY;  Surgeon: Inda Castle, MD;  Location: [facility] ENDOSCOPY;  Service: Endoscopy;  Laterality: N/A;  . HOT HEMOSTASIS N/A 12/28/2014   Procedure: HOT HEMOSTASIS (ARGON PLASMA COAGULATION/BICAP);  Surgeon: Inda Castle, MD;  Location: [facility];  Service: Endoscopy;  Laterality: N/A;  . LEFT HEART CATHETERIZATION WITH CORONARY ANGIOGRAM N/A 04/23/2012   Procedure: LEFT HEART CATHETERIZATION WITH CORONARY ANGIOGRAM;  Surgeon: Larey Dresser, MD;  Location: [facility] CATH LAB;  Service: Cardiovascular;  Laterality: N/A;  . TONSILLECTOMY    .  TUBAL LIGATION      Prior to Admission medications   Medication Sig Start Date End Date Taking? Authorizing Provider  acetaminophen (TYLENOL) 500 MG tablet Take 2 tablets (1,000 mg total) by mouth 2 (two) times daily. 10/05/15  Yes Biagio Borg, MD  aspirin EC 81 MG tablet Take 81 mg by mouth every morning.  10/12/12  Yes Scott T Kathlen Mody, PA-C  atorvastatin (LIPITOR) 20 MG tablet TAKE 1 TABLET (20 MG TOTAL) BY MOUTH DAILY. 12/28/15  Yes Biagio Borg, MD  ferrous sulfate 325 (65 FE) MG tablet Take 325 mg by mouth 2 (two) times daily with a meal.    Yes Historical Provider, MD  metoprolol succinate (TOPROL-XL) 50 MG 24 hr tablet TAKE 1 TABLET (50  MG TOTAL) BY MOUTH DAILY. 11/26/15  Yes Biagio Borg, MD  Multiple Vitamin (MULTIVITAMIN) tablet Take 1 tablet by mouth daily.   Yes Historical Provider, MD  nitroGLYCERIN (NITROSTAT) 0.4 MG SL tablet Place 0.4 mg under the tongue every 5 (five) minutes as needed for chest pain.    Historical Provider, MD    Current Facility-Administered Medications  Medication Dose Route Frequency Provider Last Rate Last Dose  . acetaminophen (TYLENOL) tablet 650 mg  650 mg Oral Q6H PRN Rondel Jumbo, PA-C       Or  . acetaminophen (TYLENOL) suppository 650 mg  650 mg Rectal Q6H PRN Rondel Jumbo, PA-C      . acetaminophen (TYLENOL) tablet 1,000 mg  1,000 mg Oral BID Rondel Jumbo, PA-C      . atorvastatin (LIPITOR) tablet 20 mg  20 mg Oral q1800 Rondel Jumbo, PA-C   20 mg at 01/19/16 1754  . bisacodyl (DULCOLAX) suppository 10 mg  10 mg Rectal Daily PRN Rondel Jumbo, PA-C      . ferrous sulfate tablet 325 mg  325 mg Oral BID WC Rondel Jumbo, PA-C   325 mg at 01/19/16 1754  . HYDROcodone-acetaminophen (NORCO/VICODIN) 5-325 MG per tablet 1-2 tablet  1-2 tablet Oral Q4H PRN Rondel Jumbo, PA-C      . magnesium citrate solution 1 Bottle  1 Bottle Oral Once PRN Rondel Jumbo, PA-C      . metoprolol succinate (TOPROL-XL) 24 hr tablet 50 mg  50 mg Oral Daily Rondel Jumbo, PA-C      . ondansetron [facility]) tablet 4 mg  4 mg Oral Q6H PRN Rondel Jumbo, PA-C       Or  . ondansetron [facility]) injection 4 mg  4 mg Intravenous Q6H PRN Rondel Jumbo, PA-C      . senna-docusate (Senokot-S) tablet 1 tablet  1 tablet Oral QHS PRN Rondel Jumbo, PA-C      . sodium chloride 0.9 % bolus 250 mL  250 mL Intravenous Once Jani Gravel, MD      . sodium chloride flush (NS) 0.9 % injection 3 mL  3 mL Intravenous Q12H Rondel Jumbo, PA-C   3 mL at 01/19/16 1718  . traZODone (DESYREL) tablet 25 mg  25 mg Oral QHS PRN Rondel Jumbo, PA-C        Allergies as of 01/19/2016 - Review Complete 01/19/2016  Allergen Reaction  Noted  . Doxycycline Diarrhea 03/23/2013  . Biaxin [clarithromycin] Other (See Comments) 01/18/2007  . Metronidazole Other (See Comments) 10/08/2011    Family History  Problem Relation Age of Onset  . Stomach cancer Mother   . Sarcoidosis Sister   .  Colon cancer Brother   . Sarcoidosis Other   . CAD Neg Hx     Social History   Social History  . Marital status: Married    Spouse name: N/A  . Number of children: 1  . Years of education: N/A   Occupational History  .      retired   Social History Main Topics  . Smoking status: Never Smoker  . Smokeless tobacco: Never Used  . Alcohol use No  . Drug use: No  . Sexual activity: Not on file    Review of Systems: All systems reviewed and negative except where noted in HPI.  Physical Exam: Vital signs in last 24 hours: Temp:  [98.3 F (36.8 C)-99.4 F (37.4 C)] 98.6 F (37 C) (10/22 0015) Pulse Rate:  [36-103] 94 (10/22 0114) Resp:  [14-22] 18 (10/22 0015) BP: (63-164)/(26-93) 150/51 (10/22 0114) SpO2:  [87 %-100 %] 100 % (10/22 0114) Weight:  [115 lb 6.4 oz (52.3 kg)] 115 lb 6.4 oz (52.3 kg) (10/21 1650) Last BM Date: 01/19/16 General:   Alert, thin black female in NAD Head:  Normocephalic and atraumatic. Eyes:  Sclera clear, no icterus.   Conjunctiva pink. Ears:  Normal auditory acuity. Nose:  No deformity, discharge,  or lesions.  Neck:  Supple; no masses  Lungs:  Clear throughout to auscultation.   No wheezes, crackles, or rhonchi.  Heart:  Regular rate and rhythm; no murmurs, clicks, rubs,  or gallops. Abdomen:  Soft,nontender, BS active,nonpalp mass or hsm.   Msk:  Symmetrical without gross deformities. . Extremities:  Without clubbing or edema. Neurologic:  Alert and  oriented x4;  grossly normal neurologically. Skin:  Intact without significant lesions or rashes.. Psych:  Alert and cooperative. Normal mood and affect.  Intake/Output from previous day: 10/21 0701 - 10/22 0700 In: 1694.5 [I.V.:46;  Blood:1648.5] Out: 800 [Urine:800] Intake/Output this shift: No intake/output data recorded.  Lab Results:  Recent Labs  01/19/16 1248 01/20/16 0057  WBC 11.9* 7.8  HGB 6.6* 10.0*  HCT 21.1* 30.7*  PLT 384 286   BMET  Recent Labs  01/19/16 1248 01/20/16 0057  NA 132* 135  K 4.9 3.9  CL 100* 104  CO2 24 23  GLUCOSE 105* 106*  BUN 20 21*  CREATININE 1.04* 0.88  CALCIUM 9.4 9.0   LFT  Recent Labs  01/20/16 0057  PROT 6.8  ALBUMIN 3.4*  AST 18  ALT 13*  ALKPHOS 54  BILITOT 1.3*   PT/INR  Recent Labs  01/20/16 0057  LABPROT 13.9  INR 1.06   Studies/Results: Dg Chest 2 View  Result Date: 01/19/2016 CLINICAL DATA:  Weakness and fatigue EXAM: CHEST  2 VIEW COMPARISON:  05/10/2014 FINDINGS: Cardiac shadow is mildly enlarged. The thoracic aorta is tortuous but stable. The lungs are well aerated bilaterally. No focal infiltrate or sizable effusion is seen. No acute bony abnormality is noted. IMPRESSION: No acute abnormality seen. Electronically Signed   By: Inez Catalina M.D.   On: 01/19/2016 14:49    IMPRESSION / PLAN:   54. 82 year old female with acute on chronic normocytic anemia. Ferritin low normal. Hgb 10.6 in early July, now at 6.6. No overt bleeding but stools always black on iron. Received 2 units of blood. Normal complete colonoscopy 2012.  -Small bowel enteroscopy today. Risks and benefits of the procedure were discussed with the patient and she agrees to proceed.  -as outpatient she will need to continue BID iron and have H+H monthly. [name]  2. A-Fib . Rate controlled. On Metoprolol.   3. CAD / HTN. On daily baby ASA   Tye Savoy  01/20/2016, 9:41 AM  Pager number [phone]

## 2016-01-20 NOTE — Progress Notes (Signed)
Patient's BP 97/70 when I took again, different monitor.  Could not get the manual to work. The map was 36, however accurate that is. Lab is drawing her blood.

## 2016-01-20 NOTE — Op Note (Signed)
[facility] Patient Name: Lindsay Doyle Procedure Date : 01/20/2016 MRN: [id number] Attending MD: Carlota Raspberry. Armbruster MD, MD Date of Birth: 1933-06-30 CSN: [id number] Age: 82 Admit Type: Inpatient Procedure:                Small bowel enteroscopy Indications:              anemia secondary to suspected blood loss Providers:                Remo Lipps P. Armbruster MD, MD, Dortha Schwalbe RN,                            RN, William Dalton, Technician Referring MD:              Medicines:                Fentanyl 37.5 micrograms IV, Midazolam 7 mg IV,                            Diphenhydramine 25 mg IV Complications:            No immediate complications. Estimated blood loss:                            Minimal. Estimated Blood Loss:     Estimated blood loss was minimal. Procedure:                Pre-Anesthesia Assessment:                           - Prior to the procedure, a History and Physical                            was performed, and patient medications and                            allergies were reviewed. The patient's tolerance of                            previous anesthesia was also reviewed. The risks                            and benefits of the procedure and the sedation                            options and risks were discussed with the patient.                            All questions were answered, and informed consent                            was obtained. Prior Anticoagulants: The patient has                            taken aspirin, last dose was 1 day prior to  procedure. ASA Grade Assessment: III - A patient                            with severe systemic disease. After reviewing the                            risks and benefits, the patient was deemed in                            satisfactory condition to undergo the procedure.                           After obtaining informed consent, the endoscope was              passed under direct vision. Throughout the                            procedure, the patient's blood pressure, pulse, and                            oxygen saturations were monitored continuously. The                            EC-[id number] ([id number]) scope was introduced through                            the mouth and advanced to the proximal jejunum. The                            small bowel enteroscopy was technically difficult                            and complex due to excessive bleeding. The patient                            tolerated the procedure well. Scope In: Scope Out: Findings:      A mild Schatzki ring (acquired) was found at the gastroesophageal       junction. Upon withdrawal at the end of the procedure there was a small       mucosal wrent noted at the site from the patient wretching, with mild       insignificant oozing.      The exam of the esophagus was otherwise normal.      The entire examined stomach was normal.      There was no evidence of significant pathology in the entire examined       proximal duodenum.      Red blood was found in the distal duodenum or proximal jejunum. Active       bleeding was noted from the 9 o'clock position in this area, suspected       due to a AVM or diuelafoy lesion. Despite lavage I could not clearly see       the lesion due to the amount of blood in the area. I applied fulguration       in the general area of where the blood was  coming from to stop the       bleeding by argon plasma, which slowed it down but did not stop it. The       area was then successfully injected with 5 mL of a 1:10,000 solution of       epinephrine in 4 quadrants around the area for hemostasis, which was       succesfull. For location marking and to clamp the area of concern to       prevent re-bleeding, while I could not clearly see the lesion which may       have been altered due to APC, two hemostatic clips were successfully       placed.  There was no bleeding at the end of the procedure. Another       single hemostasis clip was applied to what was thought to be the near       the area of concern and perhaps most distal extent reached, which was       done prior to identification of the lesion, given the amount of blood       noted on the exam. Impression:               - Mild Schatzki ring.                           - Normal esophagus otherwise                           - Normal stomach.                           - Normal examined proximal duodenum.                           - Active bleeding in the distal duodenum vs.                            proximal jejunum, suspect due to AVM or dieulafoy                            lesion. Treated with argon plasma coagulation                            (APC). Injected with epinephrine. Clips were placed                            in the area of concern, Hemostasis achieved however                            given I could not see the base of it clearly, the                            patient is at risk for rebleeding. Moderate Sedation:      Moderate (conscious) sedation was administered by the endoscopy nurse       and supervised by the endoscopist. The following parameters were       monitored: oxygen saturation, heart rate, blood pressure, and response  to care. Total physician intraservice time was 42 minutes. Recommendation:           - Return patient to hospital ward for ongoing care,                            patient will be transferred to stepdown unit for                            observation given risk of rebleeding.                           - NPO                           - Hgb now and trend, transfuse PRBC PRN                           - If the patient has obvious evidence of bleeding,                            consultation to IR for consideration for                            embolization. The area of concern that was bleeding                            is  marked by 2 hemostasis clips.                           - GI Service will continue to follow Procedure Code(s):        --- Professional ---                           [phone], Small intestinal endoscopy, enteroscopy                            beyond second portion of duodenum, not including                            ileum; with control of bleeding (eg, injection,                            bipolar cautery, unipolar cautery, laser, heater                            probe, stapler, plasma coagulator)                           99152, Moderate sedation services provided by the                            same physician or other qualified health care                            professional performing the  diagnostic or                            therapeutic service that the sedation supports,                            requiring the presence of an independent trained                            observer to assist in the monitoring of the                            patient's level of consciousness and physiological                            status; initial 15 minutes of intraservice time,                            patient age 58 years or older                           [phone], Moderate sedation services; each additional                            15 minutes intraservice time                           99153, Moderate sedation services; each additional                            15 minutes intraservice time                           44799, Unlisted procedure, small intestine Diagnosis Code(s):        --- Professional ---                           K22.2, Esophageal obstruction                           K92.2, Gastrointestinal hemorrhage, unspecified                           D50.0, Iron deficiency anemia secondary to blood                            loss (chronic) CPT copyright 2016 American Medical Association. All rights reserved. The codes documented in this report are preliminary and upon coder  review may  be revised to meet current compliance requirements. Remo Lipps P. Armbruster MD, MD 01/20/2016 6:22:22 PM This report has been signed electronically. Number of Addenda: 0

## 2016-01-21 ENCOUNTER — Ambulatory Visit: Payer: Medicare Other | Admitting: Cardiovascular Disease

## 2016-01-21 ENCOUNTER — Telehealth: Payer: Self-pay | Admitting: Cardiovascular Disease

## 2016-01-21 ENCOUNTER — Encounter (HOSPITAL_COMMUNITY): Payer: Self-pay | Admitting: Gastroenterology

## 2016-01-21 DIAGNOSIS — D62 Acute posthemorrhagic anemia: Secondary | ICD-10-CM

## 2016-01-21 DIAGNOSIS — I5032 Chronic diastolic (congestive) heart failure: Secondary | ICD-10-CM

## 2016-01-21 DIAGNOSIS — K558 Other vascular disorders of intestine: Secondary | ICD-10-CM

## 2016-01-21 DIAGNOSIS — I4891 Unspecified atrial fibrillation: Secondary | ICD-10-CM

## 2016-01-21 DIAGNOSIS — K5521 Angiodysplasia of colon with hemorrhage: Secondary | ICD-10-CM

## 2016-01-21 LAB — MRSA PCR SCREENING: MRSA BY PCR: NEGATIVE

## 2016-01-21 LAB — CBC
HCT: 30.4 % — ABNORMAL LOW (ref 36.0–46.0)
Hemoglobin: 10 g/dL — ABNORMAL LOW (ref 12.0–15.0)
MCH: 29.9 pg (ref 26.0–34.0)
MCHC: 32.9 g/dL (ref 30.0–36.0)
MCV: 91 fL (ref 78.0–100.0)
Platelets: 303 10*3/uL (ref 150–400)
RBC: 3.34 MIL/uL — ABNORMAL LOW (ref 3.87–5.11)
RDW: 17.1 % — AB (ref 11.5–15.5)
WBC: 9.7 10*3/uL (ref 4.0–10.5)

## 2016-01-21 NOTE — Progress Notes (Signed)
Daily Rounding Note  01/21/2016, 1:31 PM  LOS: 1 day   SUBJECTIVE:   Chief complaint:   No complaints except fatigue.  No abd pain.    No n/v.  Wants liquid diet,  Not ready to try solids.  Is npo currently.  No BMs Has questions about her "heart problems"  OBJECTIVE:         Vital signs in last 24 hours:    Temp:  [97.5 F (36.4 C)-98.3 F (36.8 C)] 98 F (36.7 C) (10/23 1112) Pulse Rate:  [85-142] 98 (10/23 1200) Resp:  [13-29] 20 (10/23 1200) BP: (67-189)/(33-131) 122/103 (10/23 1200) SpO2:  [95 %-100 %] 100 % (10/23 1200) Weight:  [51 kg (112 lb 6.4 oz)] 51 kg (112 lb 6.4 oz) (10/23 0500) Last BM Date: 01/20/16 Filed Weights   01/19/16 1650 01/21/16 0500  Weight: 52.3 kg (115 lb 6.4 oz) 51 kg (112 lb 6.4 oz)   General: anxious, alert, comfortable.  Not ill looking   Heart: RRR Chest: clear bil.  No dyspnea or cough Abdomen: soft, NT, ND.  BS active.   Extremities: no CCE Neuro/Psych:  Oriented x 3.  Fully alert.  No gross deficits.   Intake/Output from previous day: 10/22 0701 - 10/23 0700 In: 0  Out: 1600 [Urine:1050; Stool:550]  Intake/Output this shift: Total I/O In: -  Out: 200 [Urine:200]  Lab Results:  Recent Labs  01/20/16 0057 01/20/16 0945 01/20/16 1909 01/21/16 0709  WBC 7.8  --  16.4* 9.7  HGB 10.0* 10.0* 10.8* 10.0*  HCT 30.7* 30.0* 32.8* 30.4*  PLT 286  --  307 303   BMET  Recent Labs  01/19/16 1248 01/20/16 0057  NA 132* 135  K 4.9 3.9  CL 100* 104  CO2 24 23  GLUCOSE 105* 106*  BUN 20 21*  CREATININE 1.04* 0.88  CALCIUM 9.4 9.0   LFT  Recent Labs  01/20/16 0057  PROT 6.8  ALBUMIN 3.4*  AST 18  ALT 13*  ALKPHOS 54  BILITOT 1.3*   PT/INR  Recent Labs  01/20/16 0057  LABPROT 13.9  INR 1.06   Hepatitis Panel No results for input(s): HEPBSAG, HCVAB, HEPAIGM, HEPBIGM in the last 72 hours.  Studies/Results: Dg Chest 2 View  Result Date:  01/19/2016 CLINICAL DATA:  Weakness and fatigue EXAM: CHEST  2 VIEW COMPARISON:  05/10/2014 FINDINGS: Cardiac shadow is mildly enlarged. The thoracic aorta is tortuous but stable. The lungs are well aerated bilaterally. No focal infiltrate or sizable effusion is seen. No acute bony abnormality is noted. IMPRESSION: No acute abnormality seen. Electronically Signed   By: Inez Catalina M.D.   On: 01/19/2016 14:49   Scheduled Meds: . acetaminophen  1,000 mg Oral BID  . atorvastatin  20 mg Oral q1800  . ferrous sulfate  325 mg Oral BID WC  . metoprolol succinate  50 mg Oral Daily  . sodium chloride  250 mL Intravenous Once  . sodium chloride flush  3 mL Intravenous Q12H   Continuous Infusions:  PRN Meds:.acetaminophen **OR** acetaminophen, bisacodyl, HYDROcodone-acetaminophen, magnesium citrate, ondansetron **OR** ondansetron (ZOFRAN) IV, senna-docusate, traZODone   ASSESMENT:   *  Symptomatic acute on chronic anemia, GI tract AVMs.  Chronic po Iron.   10/22 enteroscopy:  Schiatzki ring.  Bleeding in distal duodenum vs proximal jejunum.  Treated and hemostasis obtained with APC and clips.  Not clear if this was Dieulafoy lesion or AVM, later suspected.  S/p PRBC  x 2.  Not on PPI etc.   PLAN   *  CBC in AM.  Full liquid diet.     Azucena Freed  01/21/2016, 1:31 PM Pager: [phone]  I have discussed the case with the PA, and that is the plan I formulated. I personally interviewed and examined the patient.  CC: melena  Bleeding appears to have stopped after Dr Doyne Keel endoscopic therapy.  Advance to regular diet in AM Serial Hgb/Hct Please hold aspirin at least 2 weeks to give small bowel site a chance to heal. If no evidence rebleeding tomorrow, can go home to follow up with Dr Havery Moros in 3-4 weeks    Nelida Meuse III Pager [phone]  Mon-Fri 8a-5p [phone] after 5p, weekends, holidays

## 2016-01-21 NOTE — Telephone Encounter (Signed)
I spoke with the pt's husband and he is worried because the pt missed her appointment today to meet with Dr Burt Knack.  She will be transitioning her care from Dr [name].  I made him aware that Dr [name] will still be working in our office through December.  I also advised him that if cardiology is needed during the pt's hospitalization then an order will be placed and [facility] will see the pt. He was very appreciative of my call.

## 2016-01-21 NOTE — Telephone Encounter (Signed)
Mr.Camera is returning your phone call will be at home number until 2 pm today.

## 2016-01-21 NOTE — Telephone Encounter (Signed)
Left message at both numbers for pt's spouse to contact the office.

## 2016-01-21 NOTE — Telephone Encounter (Signed)
Pt's husband calling to cancel appt today for patient, she was admitted to the hospital as of Saturday 01-19-16, husband has questions and would like a nurse call

## 2016-01-21 NOTE — Progress Notes (Signed)
PROGRESS NOTE    Lindsay Doyle  DZH:[id number] DOB: 1933-06-21 DOA: 01/19/2016 PCP: Cathlean Cower, MD   Brief Narrative: Lindsay Doyle is a 82 y.o. female with medical history significant for Atrial fibrillation,  Lifelong history of Iron deficiency anemia and colon polyps.   Assessment & Plan:   Active Problems:   Iron deficiency anemia   History of colonic polyps   Schatzki's ring   CKD (chronic kidney disease) stage 4, GFR 15-29 ml/min (HCC)   Atrial fibrillation    Symptomatic anemia   Anxiety state   Gastric AVM   Acute on chronic renal failure (HCC)   Fibromyalgia   Upper GI bleed   Symptomatic anemia Patient with history of GI bleed. Currently taking iron supplementation and has chronically black stools. No hematochezia. CBC 6.6 down from 10.6 in July 2017. S/p 2u PRBC with follow-up hemoglobin of 10. Previous GI workup significant for colon polyps and gastric AVMs. Patient underwent EGD on 10/22 and area of bleeding was identified in the distal duodenem vs proximal jejunum. Suspected AVM vs dieulafoy lesion. Treated with APC and injected with epinephrine. Bleeding controlled and clips placed for future identification if needed for future intervention. CBC stable this morning. No hematochezia or melena. No stools. -GI recommendations -CBC BID -follow-up bowel movements for recurrent bleeding -PT eval once stable from GI  Atrial fibrillation Sinus rhythm. Rate controlled. Not on anticoagulation secondary to recurrent GI bleeding. CHA2DS2-VASc Score is 5. -continue metoprolol  Hypotension Resolved. Likely secondary to volume depletion. Improved with boluses  Hyperlipidemia Continue home atorvastatin  Chronic diastolic heart failure Euvolemic. -daily weights -in/outs -on metoprolol  Chronic kidney disease stage 4. This appears to no longer be the case. Patient's creatinine was initially slightly elevated so slight AKI that has resolved today and is likely  secondary to prerenal azotemia.  CAD Hold aspirin  DVT prophylaxis: SCDs Code Status: Full code Family Communication: None at bedside Disposition Plan: Discharge home vs SNF likely in 2-3 days   Consultants:   Gastroenterology  Procedures:   EGD with APC and epinephrine injection of bleeding lesion (10/22)  Antimicrobials:   None    Subjective: Patient is weak, but otherwise okay. No complaints. No bowel movements  Objective: Vitals:   01/21/16 0500 01/21/16 0600 01/21/16 0753 01/21/16 0800  BP: 99/66 110/70 132/60 126/71  Pulse: (!) 104 (!) 101 (!) 102 91  Resp: 13 20 18 16   Temp:   98 F (36.7 C)   TempSrc:   Oral   SpO2: 98% 96% 100% 99%  Weight: 51 kg (112 lb 6.4 oz)     Height:        Intake/Output Summary (Last 24 hours) at 01/21/16 0830 Last data filed at 01/21/16 0630  Gross per 24 hour  Intake                0 ml  Output             1600 ml  Net            -1600 ml   Filed Weights   01/19/16 1650 01/21/16 0500  Weight: 52.3 kg (115 lb 6.4 oz) 51 kg (112 lb 6.4 oz)    Examination:  General exam: Appears calm and comfortable  Respiratory system: Clear to auscultation. Respiratory effort normal. Cardiovascular system: S1 & S2 heard, RRR. No murmurs, rubs, gallops or clicks. Gastrointestinal system: Abdomen is nondistended, soft and nontender. Normal bowel sounds heard. Central nervous system: Alert and  oriented. No focal neurological deficits. Extremities: No edema. No calf tenderness Skin: No cyanosis. No rashes Psychiatry: Judgement and insight appear normal. Mood & affect appropriate.     Data Reviewed: I have personally reviewed following labs and imaging studies  CBC:  Recent Labs Lab 01/19/16 1248 01/20/16 0057 01/20/16 0945 01/20/16 1909 01/21/16 0709  WBC 11.9* 7.8  --  16.4* 9.7  NEUTROABS  --   --   --  13.6*  --   HGB 6.6* 10.0* 10.0* 10.8* 10.0*  HCT 21.1* 30.7* 30.0* 32.8* 30.4*  MCV 94.6 89.8  --  90.6 91.0  PLT 384  286  --  307 962   Basic Metabolic Panel:  Recent Labs Lab 01/19/16 1248 01/20/16 0057  NA 132* 135  K 4.9 3.9  CL 100* 104  CO2 24 23  GLUCOSE 105* 106*  BUN 20 21*  CREATININE 1.04* 0.88  CALCIUM 9.4 9.0   GFR: Estimated Creatinine Clearance: 41.1 mL/min (by C-G formula based on SCr of 0.88 mg/dL). Liver Function Tests:  Recent Labs Lab 01/20/16 0057  AST 18  ALT 13*  ALKPHOS 54  BILITOT 1.3*  PROT 6.8  ALBUMIN 3.4*   No results for input(s): LIPASE, AMYLASE in the last 168 hours. No results for input(s): AMMONIA in the last 168 hours. Coagulation Profile:  Recent Labs Lab 01/20/16 0057  INR 1.06   Cardiac Enzymes:  Recent Labs Lab 01/20/16 0057  TROPONINI <0.03   BNP (last 3 results) No results for input(s): PROBNP in the last 8760 hours. HbA1C: No results for input(s): HGBA1C in the last 72 hours. CBG: No results for input(s): GLUCAP in the last 168 hours. Lipid Profile: No results for input(s): CHOL, HDL, LDLCALC, TRIG, CHOLHDL, LDLDIRECT in the last 72 hours. Thyroid Function Tests: No results for input(s): TSH, T4TOTAL, FREET4, T3FREE, THYROIDAB in the last 72 hours. Anemia Panel:  Recent Labs  01/19/16 1651 01/19/16 1652  VITAMINB12 1,097*  --   FOLATE  --  48.6  FERRITIN 19  --   TIBC 391  --   IRON 43  --   RETICCTPCT 5.0*  --    Sepsis Labs: No results for input(s): PROCALCITON, LATICACIDVEN in the last 168 hours.  Recent Results (from the past 240 hour(s))  MRSA PCR Screening     Status: None   Collection Time: 01/20/16  8:44 PM  Result Value Ref Range Status   MRSA by PCR NEGATIVE NEGATIVE Final    Comment:        The GeneXpert MRSA Assay (FDA approved for NASAL specimens only), is one component of a comprehensive MRSA colonization surveillance program. It is not intended to diagnose MRSA infection nor to guide or monitor treatment for MRSA infections.          Radiology Studies: Dg Chest 2 View  Result  Date: 01/19/2016 CLINICAL DATA:  Weakness and fatigue EXAM: CHEST  2 VIEW COMPARISON:  05/10/2014 FINDINGS: Cardiac shadow is mildly enlarged. The thoracic aorta is tortuous but stable. The lungs are well aerated bilaterally. No focal infiltrate or sizable effusion is seen. No acute bony abnormality is noted. IMPRESSION: No acute abnormality seen. Electronically Signed   By: Inez Catalina M.D.   On: 01/19/2016 14:49        Scheduled Meds: . acetaminophen  1,000 mg Oral BID  . atorvastatin  20 mg Oral q1800  . ferrous sulfate  325 mg Oral BID WC  . metoprolol succinate  50 mg Oral Daily  .  sodium chloride  250 mL Intravenous Once  . sodium chloride flush  3 mL Intravenous Q12H   Continuous Infusions:    LOS: 1 day     Cordelia Poche [facility] 01/21/2016, 8:30 AM Pager: [phone]  If 7PM-7AM, please contact night-coverage [email or web address] Password [email or web address] 01/21/2016, 8:30 AM

## 2016-01-22 DIAGNOSIS — K31811 Angiodysplasia of stomach and duodenum with bleeding: Secondary | ICD-10-CM

## 2016-01-22 DIAGNOSIS — F411 Generalized anxiety disorder: Secondary | ICD-10-CM

## 2016-01-22 LAB — CBC
HCT: 32.5 % — ABNORMAL LOW (ref 36.0–46.0)
Hemoglobin: 10.4 g/dL — ABNORMAL LOW (ref 12.0–15.0)
MCH: 29.9 pg (ref 26.0–34.0)
MCHC: 32 g/dL (ref 30.0–36.0)
MCV: 93.4 fL (ref 78.0–100.0)
PLATELETS: 340 10*3/uL (ref 150–400)
RBC: 3.48 MIL/uL — ABNORMAL LOW (ref 3.87–5.11)
RDW: 17.2 % — AB (ref 11.5–15.5)
WBC: 11.1 10*3/uL — AB (ref 4.0–10.5)

## 2016-01-22 MED ORDER — METOPROLOL SUCCINATE ER 25 MG PO TB24: 25.0000 mg | ORAL_TABLET | Freq: Every day | ORAL | 0 refills | Status: DC

## 2016-01-22 MED ORDER — METOPROLOL SUCCINATE ER 25 MG PO TB24: 25.0000 mg | ORAL_TABLET | Freq: Every day | ORAL | Status: DC

## 2016-01-22 NOTE — Progress Notes (Signed)
Dc instructions given to pt.  Pt verbalized understanding of all instructions.  No s/s of any acute distress.  No c/o pain.

## 2016-01-22 NOTE — Discharge Summary (Signed)
Physician Discharge Summary  Lindsay Doyle FHL:[id number] DOB: [birth year] DOA: 01/19/2016  PCP: Cathlean Cower, MD  Admit date: 01/19/2016 Discharge date: 01/22/2016  Admitted From: Home Disposition:  Home  Recommendations for Outpatient Follow-up:  1. Follow up with PCP in 1 week 2. GI follow-up (Dr. Havery Moros) in two weeks 3. Restart aspirin in two weeks 4. Repeat CBC if any bleeding  Home Health: None  Equipment/Devices: None  Discharge Condition: Stable CODE STATUS: Full code   Brief/Interim Summary:  HPI written by Ardelle Lesches, PA and cosigned by Dr. Lottie Mussel on 01/19/2016  HPI: Lindsay Doyle is a 82 y.o. female with medical history significant for Atrial fibrillation,  Lifelong history of Iron deficiency anemia and colon polyps requiring blood transfusions in the past, last about 8 months ago, presenting today with progressive generalized weakness and palpitations (controlled with metoprolol). Denies fevers, chills, night sweats, vision changes, or mucositis. Denies any respiratory complaints. Denies any chest pain. Denies lower extremity swelling. Denies nausea, heartburn or change in bowel habits. Last bowel movement on 10/20, chronically darker due to iron pills . Denies abdominal pain. Appetite is normal. Denies any dysuria. Denies abnormal skin rashes, or neuropathy. Denies any bleeding issues such as epistaxis, hematemesis, hematuria or hematochezia. She feels very week at the time of admission . Denies recent infections. Denies pica. Denies bleeding issues. No recent surgeries. She does take daily ASA 81 mg. No other blood thinners. Last hospitalization for symptomatic anemia in 11/2014   ED Course:  BP 92/78   Pulse 98   Temp 98.3 F (36.8 C) (Oral)   Resp 14   SpO2 100%    Cr 1.04 with EGFR 57  EKG Sinus rhythm with Premature atrial complexes Otherwise normal ECG  Tn 0.07  Hb 6.6 , MCV 94.6, platelets normal. WBC 11.9  Glu 105 CXR without abnormalities   Receiving 2 units of blood  Hemoccult pending    Hospital course:  Upper GI bleed Patient with history of GI bleed. Currently taking iron supplementation and has chronically black stools. No hematochezia. CBC 6.6 down from 10.6 in July 2017. S/p 2u PRBC with follow-up hemoglobin of 10. Previous GI workup significant for colon polyps and gastric AVMs. Patient underwent EGD on 10/22 and area of bleeding was identified in the distal duodenem vs proximal jejunum. Suspected AVM vs dieulafoy lesion. Treated with APC and injected with epinephrine. Bleeding controlled and clips placed for future identification if needed for future intervention. CBC stable this morning. No hematochezia or melena. No stools.  Atrial fibrillation Sinus rhythm. Rate controlled. Not on anticoagulation secondary to recurrent GI bleeding. CHA2DS2-VASc Scoreis 5. Continued metoprolol  Hypotension Resolved. Likely secondary to volume depletion. Improved with boluses  Hyperlipidemia Continued home atorvastatin  Chronic diastolic heart failure Euvolemic.  Chronic kidney disease stage 4. This appears to no longer be the case. Patient's creatinine was initially slightly elevated so slight AKI that has resolved and was likely secondary to prerenal azotemia from transient hypotension from blood loss.  CAD Hold aspirin   Discharge Diagnoses:  Principal Problem:   Upper GI bleed Active Problems:   Iron deficiency anemia   History of colonic polyps   Schatzki's ring   CKD (chronic kidney disease) stage 4, GFR 15-29 ml/min (HCC)   Atrial fibrillation    Symptomatic anemia   Anxiety state   Gastric AVM   Acute on chronic renal failure (HCC)   Fibromyalgia   Chronic diastolic heart failure (HCC)   AVM (arteriovenous malformation)  of small bowel, acquired with hemorrhage [facility])    Discharge Instructions     Medication List    STOP taking these medications   aspirin EC 81 MG tablet     TAKE these  medications   acetaminophen 500 MG tablet Commonly known as:  TYLENOL Take 2 tablets (1,000 mg total) by mouth 2 (two) times daily.   atorvastatin 20 MG tablet Commonly known as:  LIPITOR TAKE 1 TABLET (20 MG TOTAL) BY MOUTH DAILY.   ferrous sulfate 325 (65 FE) MG tablet Take 325 mg by mouth 2 (two) times daily with a meal.   metoprolol succinate 25 MG 24 hr tablet Commonly known as:  TOPROL-XL Take 1 tablet (25 mg total) by mouth daily. Start taking on:  01/23/2016 What changed:  See the new instructions.   multivitamin tablet Take 1 tablet by mouth daily.   nitroGLYCERIN 0.4 MG SL tablet Commonly known as:  NITROSTAT Place 0.4 mg under the tongue every 5 (five) minutes as needed for chest pain.      Follow-up Information    Manus Gunning, MD. Schedule an appointment as soon as possible for a visit in 2 week(s).   Specialty:  Gastroenterology Contact information: [address] [place] [address] [phone]          Allergies  Allergen Reactions  . Doxycycline Diarrhea    Possible diarrhea  . Biaxin [Clarithromycin] Other (See Comments)    diarrhea  . Metronidazole Other (See Comments)    Pt had difficulty swallowing this and says it was "horrible" to take. No allergic reaction.    Consultations:  [facility], Dr. Havery Moros   Procedures/Studies: Dg Chest 2 View  Result Date: 01/19/2016 CLINICAL DATA:  Weakness and fatigue EXAM: CHEST  2 VIEW COMPARISON:  05/10/2014 FINDINGS: Cardiac shadow is mildly enlarged. The thoracic aorta is tortuous but stable. The lungs are well aerated bilaterally. No focal infiltrate or sizable effusion is seen. No acute bony abnormality is noted. IMPRESSION: No acute abnormality seen. Electronically Signed   By: Inez Catalina M.D.   On: 01/19/2016 14:49     Small bowel enteroscopy (01/20/2016)  Impression: - Mild Schatzki ring. - Normal esophagus otherwise - Normal stomach. - Normal examined proximal  duodenum. - Active bleeding in the distal duodenum vs. proximal jejunum, suspect due to AVM or dieulafoy lesion. Treated with argon plasma coagulation (APC). Injected with epinephrine. Clips were placed in the area of concern, Hemostasis achieved however given I could not see the base of it clearly, the patient is at risk for rebleeding.  Subjective: Patient feels well today. No abdominal pain. No melena.  Discharge Exam: Vitals:   01/22/16 1200 01/22/16 1300  BP: (!) 86/65 106/65  Pulse: 93 88  Resp: 18 15  Temp: 98.7 F (37.1 C)    Vitals:   01/22/16 1000 01/22/16 1100 01/22/16 1200 01/22/16 1300  BP: 98/63 (!) 94/56 (!) 86/65 106/65  Pulse: (!) 108 87 93 88  Resp: '18 16 18 15  ' Temp:   98.7 F (37.1 C)   TempSrc:   Oral   SpO2: 100% 98% 98% 100%  Weight:      Height:        General exam: Appears calm and comfortable  Respiratory system: Clear to auscultation. Respiratory effort normal. Cardiovascular system: S1 & S2 heard, RRR. No murmurs, rubs, gallops or clicks. Gastrointestinal system: Abdomen is nondistended, soft and nontender. Normal bowel sounds heard. Central nervous system: Alert and  oriented. No focal neurological deficits. Extremities: No edema. No calf tenderness Skin: No cyanosis. No rashes Psychiatry: Judgement and insight appear normal. Mood & affect appropriate.   The results of significant diagnostics from this hospitalization (including imaging, microbiology, ancillary and laboratory) are listed below for reference.     Microbiology: Recent Results (from the past 240 hour(s))  MRSA PCR Screening     Status: None   Collection Time: 01/20/16  8:44 PM  Result Value Ref Range Status   MRSA by PCR NEGATIVE NEGATIVE Final    Comment:        The GeneXpert MRSA Assay (FDA approved for NASAL specimens only), is one component of a comprehensive MRSA colonization surveillance program. It is not intended to diagnose MRSA infection nor to guide  or monitor treatment for MRSA infections.      Labs: BNP (last 3 results) No results for input(s): BNP in the last 8760 hours. Basic Metabolic Panel:  Recent Labs Lab 01/19/16 1248 01/20/16 0057  NA 132* 135  K 4.9 3.9  CL 100* 104  CO2 24 23  GLUCOSE 105* 106*  BUN 20 21*  CREATININE 1.04* 0.88  CALCIUM 9.4 9.0   Liver Function Tests:  Recent Labs Lab 01/20/16 0057  AST 18  ALT 13*  ALKPHOS 54  BILITOT 1.3*  PROT 6.8  ALBUMIN 3.4*   No results for input(s): LIPASE, AMYLASE in the last 168 hours. No results for input(s): AMMONIA in the last 168 hours. CBC:  Recent Labs Lab 01/19/16 1248 01/20/16 0057 01/20/16 0945 01/20/16 1909 01/21/16 0709 01/22/16 0229  WBC 11.9* 7.8  --  16.4* 9.7 11.1*  NEUTROABS  --   --   --  13.6*  --   --   HGB 6.6* 10.0* 10.0* 10.8* 10.0* 10.4*  HCT 21.1* 30.7* 30.0* 32.8* 30.4* 32.5*  MCV 94.6 89.8  --  90.6 91.0 93.4  PLT 384 286  --  307 303 340   Cardiac Enzymes:  Recent Labs Lab 01/20/16 0057  TROPONINI <0.03   BNP: Invalid input(s): POCBNP CBG: No results for input(s): GLUCAP in the last 168 hours. D-Dimer No results for input(s): DDIMER in the last 72 hours. Hgb A1c No results for input(s): HGBA1C in the last 72 hours. Lipid Profile No results for input(s): CHOL, HDL, LDLCALC, TRIG, CHOLHDL, LDLDIRECT in the last 72 hours. Thyroid function studies No results for input(s): TSH, T4TOTAL, T3FREE, THYROIDAB in the last 72 hours.  Invalid input(s): FREET3 Anemia work up  Recent Labs  01/19/16 1651 01/19/16 1652  VITAMINB12 1,097*  --   FOLATE  --  48.6  FERRITIN 19  --   TIBC 391  --   IRON 43  --   RETICCTPCT 5.0*  --    Urinalysis    Component Value Date/Time   COLORURINE YELLOW 01/19/2016 [place] 01/19/2016 1604   LABSPEC 1.005 01/19/2016 1604   LABSPEC 1.010 04/03/2011 1135   PHURINE 7.0 01/19/2016 [place] 01/19/2016 [place] 10/05/2015  1355   HGBUR TRACE (A) 01/19/2016 [place] 01/19/2016 [place] 05/08/2014 1052   BILIRUBINUR Negative 04/03/2011 [place] 01/19/2016 [place] 01/19/2016 1604   UROBILINOGEN 0.2 10/05/2015 1355   NITRITE NEGATIVE 01/19/2016 1604   LEUKOCYTESUR NEGATIVE 01/19/2016 1604   LEUKOCYTESUR Moderate 04/03/2011 1135   Sepsis Labs Invalid input(s): PROCALCITONIN,  WBC,  LACTICIDVEN Microbiology Recent Results (  from the past 240 hour(s))  MRSA PCR Screening     Status: None   Collection Time: 01/20/16  8:44 PM  Result Value Ref Range Status   MRSA by PCR NEGATIVE NEGATIVE Final    Comment:        The GeneXpert MRSA Assay (FDA approved for NASAL specimens only), is one component of a comprehensive MRSA colonization surveillance program. It is not intended to diagnose MRSA infection nor to guide or monitor treatment for MRSA infections.      Time coordinating discharge: Over 30 minutes  SIGNED:   Cordelia Poche, MD [facility] 01/22/2016, 2:02 PM Pager [phone]  If 7PM-7AM, please contact night-coverage [email or web address] Password [email or web address]

## 2016-01-22 NOTE — Discharge Instructions (Addendum)
Lindsay Doyle, you were admitted for GI bleeding. The GI doctor found the source of your bleeding and stopped it. Your blood counts have been stable. Please hold your aspirin for two weeks. Please follow-up with Dr. Havery Moros. Please continue your iron tablets.

## 2016-01-22 NOTE — Progress Notes (Signed)
          Daily Rounding Note  01/22/2016, 8:21 AM  LOS: 2 days   SUBJECTIVE:   Chief complaint:  Fatigue, which is improved   Not passing melena or dark stool.  Feels well and tolerating solids.  Ready to go home  OBJECTIVE:         Vital signs in last 24 hours:    Temp:  [97.8 F (36.6 C)-98.4 F (36.9 C)] 98 F (36.7 C) (10/24 0500) Pulse Rate:  [79-117] 90 (10/24 0500) Resp:  [8-22] 14 (10/24 0500) BP: (102-136)/(63-103) 131/88 (10/24 0500) SpO2:  [95 %-100 %] 100 % (10/24 0500) Last BM Date: 01/20/16 Filed Weights   01/19/16 1650 01/21/16 0500  Weight: 52.3 kg (115 lb 6.4 oz) 51 kg (112 lb 6.4 oz)   General: pleasant, looks tired but fully awake   Heart: RRR Chest: clear bil.  No labored breathing Abdomen: soft, NT, ND.  Active BS  Extremities: no CCE Neuro/Psych:  Oriented x 3.  No tremor or gross weakness.   Intake/Output from previous day: 10/23 0701 - 10/24 0700 In: 980 [P.O.:980] Out: 1200 [Urine:1200]  Intake/Output this shift: No intake/output data recorded.  Lab Results:  Recent Labs  01/20/16 1909 01/21/16 0709 01/22/16 0229  WBC 16.4* 9.7 11.1*  HGB 10.8* 10.0* 10.4*  HCT 32.8* 30.4* 32.5*  PLT 307 303 340   BMET  Recent Labs  01/19/16 1248 01/20/16 0057  NA 132* 135  K 4.9 3.9  CL 100* 104  CO2 24 23  GLUCOSE 105* 106*  BUN 20 21*  CREATININE 1.04* 0.88  CALCIUM 9.4 9.0   LFT  Recent Labs  01/20/16 0057  PROT 6.8  ALBUMIN 3.4*  AST 18  ALT 13*  ALKPHOS 54  BILITOT 1.3*   PT/INR  Recent Labs  01/20/16 0057  LABPROT 13.9  INR 1.06   Hepatitis Panel No results for input(s): HEPBSAG, HCVAB, HEPAIGM, HEPBIGM in the last 72 hours.  Studies/Results: No results found.  ASSESMENT:   *  Symptomatic acute on chronic anemia, GI tract AVMs.  Chronic po Iron.   10/22 enteroscopy:  Schiatzki ring.  Bleeding in distal duodenum vs proximal jejunum.  Treated and hemostasis  obtained with APC and clips.  Not clear if this was Dieulafoy lesion or AVM, later suspected.  S/p PRBC x 2. Hgb stable.  Not on PPI etc.     PLAN   *  Follow up Dr Havery Moros late November 2017. Ok to see GI  APP.    *  Stay on usual BID iron.  Restart usual home dose 81 ASA ~ 02/04/16.     Azucena Freed  01/22/2016, 8:21 AM Pager: [phone]

## 2016-01-23 ENCOUNTER — Telehealth: Payer: Self-pay | Admitting: Gastroenterology

## 2016-01-23 NOTE — Telephone Encounter (Signed)
Patient has been scheduled for office visit 11/2 at 1:30pm with Amy, PA-C.

## 2016-01-23 NOTE — Telephone Encounter (Signed)
Please schedule patient with one of the PA's next week. Unfortunately, I don't have anywhere in Dr. Doyne Keel schedule to have her see him. Thank you.

## 2016-01-25 ENCOUNTER — Telehealth: Payer: Self-pay | Admitting: Physician Assistant

## 2016-01-25 NOTE — Telephone Encounter (Signed)
Resume bland diet for new 3-4 days then slowly resume prior diet

## 2016-01-25 NOTE — Telephone Encounter (Signed)
Doc of the Day Patient had a GI bleed and was recently hospitalized. She has a follow up appointment with Nicoletta Ba PA on 01/31/16.  Today she calls with concerns about 2 loose to watery stools she had this morning. She had been eating very bland, but last night she did eat spaghetti and collard greens. She does not have pain, nausea or urgency. She is asking should she continue the bland diet for now? Stools are not black or tarry.

## 2016-01-25 NOTE — Telephone Encounter (Signed)
Patient is instructed and agrees with this plan of care.

## 2016-01-31 ENCOUNTER — Encounter: Payer: Self-pay | Admitting: Physician Assistant

## 2016-01-31 ENCOUNTER — Ambulatory Visit (INDEPENDENT_AMBULATORY_CARE_PROVIDER_SITE_OTHER): Payer: Medicare Other | Admitting: Physician Assistant

## 2016-01-31 ENCOUNTER — Other Ambulatory Visit (INDEPENDENT_AMBULATORY_CARE_PROVIDER_SITE_OTHER): Payer: Medicare Other

## 2016-01-31 VITALS — BP 130/80 | HR 84 | Ht 61.42 in | Wt 116.4 lb

## 2016-01-31 DIAGNOSIS — I25810 Atherosclerosis of coronary artery bypass graft(s) without angina pectoris: Secondary | ICD-10-CM | POA: Diagnosis not present

## 2016-01-31 DIAGNOSIS — D62 Acute posthemorrhagic anemia: Secondary | ICD-10-CM

## 2016-01-31 LAB — CBC WITH DIFFERENTIAL/PLATELET
Basophils Absolute: 0.1 10*3/uL (ref 0.0–0.1)
Basophils Relative: 0.7 % (ref 0.0–3.0)
EOS ABS: 0.3 10*3/uL (ref 0.0–0.7)
Eosinophils Relative: 3.1 % (ref 0.0–5.0)
HCT: 30.4 % — ABNORMAL LOW (ref 36.0–46.0)
HEMOGLOBIN: 10 g/dL — AB (ref 12.0–15.0)
LYMPHS PCT: 26.9 % (ref 12.0–46.0)
Lymphs Abs: 2.2 10*3/uL (ref 0.7–4.0)
MCHC: 32.9 g/dL (ref 30.0–36.0)
MCV: 91.7 fl (ref 78.0–100.0)
MONO ABS: 1 10*3/uL (ref 0.1–1.0)
Monocytes Relative: 12.4 % — ABNORMAL HIGH (ref 3.0–12.0)
Neutro Abs: 4.6 10*3/uL (ref 1.4–7.7)
Neutrophils Relative %: 56.9 % (ref 43.0–77.0)
Platelets: 379 10*3/uL (ref 150.0–400.0)
RBC: 3.32 Mil/uL — AB (ref 3.87–5.11)
RDW: 16.7 % — ABNORMAL HIGH (ref 11.5–15.5)
WBC: 8.1 10*3/uL (ref 4.0–10.5)

## 2016-01-31 NOTE — Patient Instructions (Signed)
Please go to the basement level to have your labs drawn.  Increase the Iron supplement to 1 tablet 3 times a day.

## 2016-01-31 NOTE — Progress Notes (Signed)
Subjective:    Patient ID: Lindsay Doyle, female    DOB: 1933-10-11, 82 y.o.   MRN: [id number]  HPI  Lindsay Doyle is a pleasant 82 year old African-American female, previously known to Dr. Deatra Ina and recently in by Dr. Havery Moros during hospitalization for symptomatic anemia and hemoglobin of 6. Patient has history of coronary artery disease, status post previous MI history of SVT, atrial fibrillation, hypertension, and previous episodes of marked anemia felt secondary to GI blood loss. Patient was admitted on 01/19/2016 with symptomatic anemia and worked weakness. She was transfused 2 units of packed RBCs. She underwent small bowel enteroscopy 01/20/2016 per Dr. Havery Moros with finding of active bleeding in the distal duodenum versus proximal jejunum ,lesion  was not well identified but felt to be an AVM versus [place]  lesion. This was treated with APC, epinephrine and Endo Clip . Patient did not have any further evidence of bleeding. Hemoglobin was 10.4 at the time of discharge on 01/22/2016. She did have iron studies checked on admission as well showing a serum iron of 43 TIBC 391 and iron saturation of 11, ferritin of 19. She has been taking ferrous sulfate 325 mg by mouth twice a day fairly long term. She comes in today stating that she's been feeling very well. Her energy level has been good, she says her stool stayed dark because of the iron but she has not noticed any changes in stool coloration melena or hematochezia. She was asked to stay off of her baby aspirin for at least 2 weeks and has done that. She has not been using any NSAIDs. Review of record showed that she did have colonoscopy in March 2012 also done for iron deficiency anemia this was a normal exam and EGD in March 2012 showed mild gastritis.  Previous small bowel enteroscopy was done in 2016 per Dr. Deatra Ina with APC of a gastric AVM.  Review of Systems Pertinent positive and negative review of systems were noted in the above  HPI section.  All other review of systems was otherwise negative.  Outpatient Encounter Prescriptions as of 01/31/2016  Medication Sig  . acetaminophen (TYLENOL) 500 MG tablet Take 2 tablets (1,000 mg total) by mouth 2 (two) times daily.  [name] atorvastatin (LIPITOR) 20 MG tablet TAKE 1 TABLET (20 MG TOTAL) BY MOUTH DAILY.  . ferrous sulfate 325 (65 FE) MG tablet Take 325 mg by mouth 2 (two) times daily with a meal.   . metoprolol succinate (TOPROL-XL) 25 MG 24 hr tablet Take 1 tablet (25 mg total) by mouth daily.  . Multiple Vitamin (MULTIVITAMIN) tablet Take 1 tablet by mouth daily.  . nitroGLYCERIN (NITROSTAT) 0.4 MG SL tablet Place 0.4 mg under the tongue every 5 (five) minutes as needed for chest pain.   No facility-administered encounter medications on file as of 01/31/2016.    Allergies  Allergen Reactions  . Doxycycline Diarrhea    Possible diarrhea  . Biaxin [Clarithromycin] Other (See Comments)    diarrhea  . Metronidazole Other (See Comments)    Pt had difficulty swallowing this and says it was "horrible" to take. No allergic reaction.   Patient Active Problem List   Diagnosis Date Noted  . Chronic diastolic heart failure ([place]) 01/21/2016  . AVM (arteriovenous malformation) of small bowel, acquired with hemorrhage ([place])   . Upper GI bleed   . Eyelid abnormality 11/30/2015  . Osteoarthritis, hand 10/05/2015  . Rash 03/29/2015  . Atrial fibrillation ([place]) 02/02/2015  . Fibromyalgia 01/25/2015  . Myalgia 12/08/2014  .  Helicobacter pylori ab+ 05/19/2014  . Dehydration   . Enteritis   . Abdominal pain, generalized   . Malnutrition of moderate degree ([place]) 05/10/2014  . Chronic hyponatremia 05/09/2014  . Gastroenteritis 05/09/2014  . Guaiac positive stools 05/08/2014  . Tachycardia 05/08/2014  . Occult blood positive stool 05/08/2014  . AKI (acute kidney injury) ([place]) 10/21/2013  . Acute on chronic renal failure ([place]) 10/21/2013  . UTI (lower urinary tract infection)  10/20/2013  . ARF (acute renal failure) ([place]) 10/20/2013  . Gastric AVM 10/10/2013  . Acute blood loss anemia 09/23/2013  . Dark stools 09/21/2013  . Anxiety state 04/20/2013  . Cervical spine arthritis ([place]) 03/11/2013  . Neck pain on left side 02/15/2013  . Lump in neck 09/23/2012  . Symptomatic anemia 09/17/2012  . Other malaise and fatigue 09/14/2012  . Dizziness 09/14/2012  . Leg cramp 09/14/2012  . Spinal stenosis of lumbar region 07/16/2012  . Osteoporosis 04/27/2012  . Coronary Artery Disease 04/24/2012  . NSTEMI (non-ST elevated myocardial infarction) ([place]) 04/23/2012  . Atrial fibrillation  04/23/2012  . Preventative health care 01/08/2012  . Diarrhea 10/07/2011  . CKD (chronic kidney disease) stage 4, GFR 15-29 ml/min ([facility]) 09/30/2011  . Unspecified disorder of liver 10/22/2010  . Menopausal state 10/22/2010  . Schatzki's ring 10/22/2010  . Hip pain, left 07/23/2010  . PSVT 05/03/2009  . OSTEOARTHRITIS 02/19/2009  . ASYMPTOMATIC POSTMENOPAUSAL STATUS 07/17/2008  . LEUKOPENIA, MILD 05/30/2008  . SKIN RASH 07/02/2007  . Impaired glucose tolerance 07/02/2007  . Hyperlipidemia 03/09/2007  . Iron deficiency anemia 03/09/2007  . MITRAL VALVE PROLAPSE 03/09/2007  . Allergic rhinitis 03/09/2007  . COPD (chronic obstructive pulmonary disease) ([place]) 03/09/2007  . History of colonic polyps 03/09/2007  . Asthma 01/18/2007  . Cough 01/18/2007  . Essential hypertension 11/12/2006   Social History   Social History  . Marital status: Married    Spouse name: N/A  . Number of children: 1  . Years of education: N/A   Occupational History  .      retired   Social History Main Topics  . Smoking status: Never Smoker  . Smokeless tobacco: Never Used  . Alcohol use No  . Drug use: No  . Sexual activity: Not on file   Other Topics Concern  . Not on file   Social History Narrative  . No narrative on file    Lindsay Doyle's family history includes Colon cancer in her  brother; Prostate cancer in her brother; Sarcoidosis in her other and sister; Stomach cancer in her mother.      Objective:    Vitals:   01/31/16 1328  BP: 130/80  Pulse: 84    Physical Exam    well-developed elderly African-American female in no acute distress, accompanied by her husband, very pleasant, blood pressure 130/80 pulse 84 BMI 21.69 weight 116. HEENT; nontraumatic normocephalic EOMI PERRLA sclera anicteric, Cardiovascular; regular rate and rhythm with S1-S2 no murmur rub or gallop, Pulmonary ;clear bilaterally, Abdomen ;soft, nontender nondistended bowel sounds are active there is no palpable mass or hepatosplenomegaly bowel sounds are present, Rectal; exam not done,, Extremities ;no clubbing cyanosis or edema skin warm and dry, Neuropsych; mood and affect appropriate       Assessment & Plan:   #20 82 year old African-American female with history of recurrent symptomatic anemia secondary to GI blood loss. Patient has previously documented AVMs and is now status post recent admission with hemoglobin of 6. Enteroscopy showed an actively bleeding lesion in the distal  duodenum versus proximal jejunum, lesion not well seen but felt to be an AVM versus dual for lesion and was treated with APC, epinephrine and Endo Clip. #2 chronic iron deficiency anemia #3 history of atrial fibrillation she had been maintained just on baby aspirin #4 coronary artery disease status post remote MI #5 hypertension\  Plan; Will repeat CBC today and will plan to follow serially Increase ferrous sulfate to 325 mg by mouth 3 times a day As patient is generally not aware that she has been bleeding she is concerned about how to know when she is getting anemic. She was advised to be attention to progressive weakness, lightheadedness etc. and is advised to have her hemoglobin checked if and when she has any recurrent symptoms. Patient will have follow-up office visit with Dr. Havery Moros in 2 months.  Advised  she may resume her baby aspirin in 2 weeks.   Lindsay Doyle Genia Harold PA-C 01/31/2016   Cc: Biagio Borg, MD

## 2016-02-01 NOTE — Progress Notes (Signed)
Agree with plan as outlined. Continue iron for now and trend CBC. I will see her in follow up.

## 2016-02-05 ENCOUNTER — Other Ambulatory Visit: Payer: Self-pay

## 2016-02-05 DIAGNOSIS — D62 Acute posthemorrhagic anemia: Secondary | ICD-10-CM

## 2016-02-17 ENCOUNTER — Other Ambulatory Visit: Payer: Self-pay | Admitting: Internal Medicine

## 2016-02-18 ENCOUNTER — Other Ambulatory Visit (INDEPENDENT_AMBULATORY_CARE_PROVIDER_SITE_OTHER): Payer: Medicare Other

## 2016-02-18 ENCOUNTER — Telehealth: Payer: Self-pay | Admitting: Cardiovascular Disease

## 2016-02-18 DIAGNOSIS — D62 Acute posthemorrhagic anemia: Secondary | ICD-10-CM | POA: Diagnosis not present

## 2016-02-18 LAB — CBC WITH DIFFERENTIAL/PLATELET
BASOS ABS: 0.1 10*3/uL (ref 0.0–0.1)
Basophils Relative: 0.6 % (ref 0.0–3.0)
EOS ABS: 0.3 10*3/uL (ref 0.0–0.7)
Eosinophils Relative: 4 % (ref 0.0–5.0)
HEMATOCRIT: 31.5 % — AB (ref 36.0–46.0)
HEMOGLOBIN: 10.4 g/dL — AB (ref 12.0–15.0)
LYMPHS PCT: 31.4 % (ref 12.0–46.0)
Lymphs Abs: 2.7 10*3/uL (ref 0.7–4.0)
MCHC: 33 g/dL (ref 30.0–36.0)
MCV: 91.6 fl (ref 78.0–100.0)
Monocytes Absolute: 1.2 10*3/uL — ABNORMAL HIGH (ref 0.1–1.0)
Monocytes Relative: 13.4 % — ABNORMAL HIGH (ref 3.0–12.0)
Neutro Abs: 4.4 10*3/uL (ref 1.4–7.7)
Neutrophils Relative %: 50.6 % (ref 43.0–77.0)
PLATELETS: 308 10*3/uL (ref 150.0–400.0)
RBC: 3.44 Mil/uL — AB (ref 3.87–5.11)
RDW: 16.9 % — ABNORMAL HIGH (ref 11.5–15.5)
WBC: 8.7 10*3/uL (ref 4.0–10.5)

## 2016-02-18 NOTE — Telephone Encounter (Signed)
I spoke with the pt and she said My Chart shows that she was seen in our office 01/21/16. I made her aware that I do not have access to what she can see on My Chart.  I did verify from the office side that this appointment is listed as cancelled due to pt being in hospital.

## 2016-02-18 NOTE — Telephone Encounter (Signed)
Patient walked in today. States that the hospital changed her metoprolol script from 50mg  to 25 mg. She is asking that we moved the script back to 50 mg for this refill.

## 2016-02-18 NOTE — Telephone Encounter (Signed)
Lindsay Doyle is calling to say that there is something wrong on her  My Chart about her visit with Dr. Burt Knack . Please call

## 2016-02-19 ENCOUNTER — Telehealth: Payer: Self-pay

## 2016-02-19 ENCOUNTER — Other Ambulatory Visit: Payer: Self-pay

## 2016-02-19 DIAGNOSIS — D649 Anemia, unspecified: Secondary | ICD-10-CM

## 2016-02-19 MED ORDER — METOPROLOL SUCCINATE ER 25 MG PO TB24: 25.0000 mg | ORAL_TABLET | Freq: Every day | ORAL | 3 refills | Status: DC

## 2016-02-19 NOTE — Telephone Encounter (Signed)
This has apparently already been done as last rx on record is for the 25 mg

## 2016-02-19 NOTE — Telephone Encounter (Signed)
Please advise patient was in the hospital again for a transfusion due to blood loss. They suggested she change her metoporol from 50 to 25mg . She is needing the new prescription sent to her pharmacy

## 2016-02-20 ENCOUNTER — Ambulatory Visit (INDEPENDENT_AMBULATORY_CARE_PROVIDER_SITE_OTHER): Payer: Medicare Other | Admitting: Internal Medicine

## 2016-02-20 ENCOUNTER — Encounter: Payer: Self-pay | Admitting: Internal Medicine

## 2016-02-20 VITALS — BP 120/76 | HR 70 | Resp 20 | Wt 115.0 lb

## 2016-02-20 DIAGNOSIS — M797 Fibromyalgia: Secondary | ICD-10-CM

## 2016-02-20 DIAGNOSIS — I25810 Atherosclerosis of coronary artery bypass graft(s) without angina pectoris: Secondary | ICD-10-CM

## 2016-02-20 DIAGNOSIS — R7302 Impaired glucose tolerance (oral): Secondary | ICD-10-CM

## 2016-02-20 DIAGNOSIS — D62 Acute posthemorrhagic anemia: Secondary | ICD-10-CM

## 2016-02-20 DIAGNOSIS — I10 Essential (primary) hypertension: Secondary | ICD-10-CM

## 2016-02-20 MED ORDER — FERROUS SULFATE 325 (65 FE) MG PO TABS: 325.0000 mg | ORAL_TABLET | Freq: Three times a day (TID) | ORAL | Status: DC

## 2016-02-20 NOTE — Patient Instructions (Signed)
Please continue all other medications as before, and refills have been done if requested.  Please have the pharmacy call with any other refills you may need.  Please continue your efforts at being more active, low cholesterol diet, and weight control.  You are otherwise up to date with prevention measures today.  Please keep your appointments with your specialists as you may have planned   

## 2016-02-20 NOTE — Progress Notes (Signed)
Subjective:    Patient ID: Lindsay Doyle, female    DOB: [birth year], 82 y.o.   MRN: [id number]  HPI  Here to f/u after recent GI bleed with AVM, has seen Nov 2 per GI, overall doing ok, asked to increase the iron sulfate to tid, cbc slightly improved to hgb 10.4, and to f/u at 2 mo.  Pt has restarted asa 81 mg per day several days prior.   Pt denies chest pain, increased sob or doe, wheezing, orthopnea, PND, increased LE swelling, palpitations, dizziness or syncope.  Pt denies new neurological symptoms such as new headache, or facial or extremity weakness or numbness   Pt denies polydipsia, polyuria. Denies worsening depressive symptoms, suicidal ideation, or panic, states FMS pain persistent without worsening Past Medical History:  Diagnosis Date  . ANEMIA-IRON DEFICIENCY    presumed GI bleed 08/2012- anticoag stopped  . Asthma    per pt, she does not have asthma  . Atrial fibrillation ([place])    a. Apixaban started 03/2012 - stopped after admx for GI bleed 6/14  . Blood transfusion    a. 05/2010: 3 transfusions for anemia/GIB.  [name] CAD (coronary artery disease)    a. LHC 1/14:  mLAD serial 30 and 60%, oRCA 30%, EF 60%, Afib,   . Cataract   . COLONIC POLYPS, HX OF 03/09/2007  . COPD 03/09/2007   ? pt is unsure  . GERD (gastroesophageal reflux disease)   . GI bleed    a. 05/2010: Hgb 7 at outside hospital s/p 3 u PRBC, EGD 05/2010 mild gastritis, small hiatal hernia, Schatzki ring, negative colonoscopy; capsule endoscopy 06/2010 - possible nonspecific inflammatory changes.;  b.  08/2012: 1/2 guiac +; required transfusion - Apixaban d/c'd  . Hx of echocardiogram    a. echo 2/11:  EF 55-60%, mild AI, mild RAE, mild to mod TR;   b.  a. Echo 2/14:  Mild focal basal and mild LVH, EF 60-65%, mild AI, mild RAE, trivia effusion  . HYPERGLYCEMIA 07/02/2007  . Hyperlipidemia   . Hypertension 11/12/2006  . LEUKOPENIA, MILD 05/30/2008  . Mitral valve prolapse    Remotely - last echo 2011 did not show this  .  NSTEMI (non-ST elevated myocardial infarction) ([place]) 03/2012   a. Type II in setting of AF with RVR 03/2012  . OSTEOARTHRITIS 02/19/2009   Spinal  . OSTEOPOROSIS 11/12/2006  . Positive H. pylori test 10/1996  . PSVT    a. Remotely, in setting of anemia.  [name] Spinal stenosis of lumbar region 07/16/2012  . Transfusion history    7/15    Past Surgical History:  Procedure Laterality Date  . ABDOMINAL HYSTERECTOMY    . CARDIAC CATHETERIZATION  1992   S/P in 1992 at [facility] in [place]. This was negative for any coronary artery disease  . carotid duplex  08/29/2003  . CATARACT EXTRACTION    . ceasarean    . ENTEROSCOPY N/A 10/10/2013   Procedure: ENTEROSCOPY;  Surgeon: Inda Castle, MD;  Location: [facility] ENDOSCOPY;  Service: Endoscopy;  Laterality: N/A;  . ENTEROSCOPY N/A 12/28/2014   Procedure: ENTEROSCOPY;  Surgeon: Inda Castle, MD;  Location: [facility] ENDOSCOPY;  Service: Endoscopy;  Laterality: N/A;  . ENTEROSCOPY N/A 01/20/2016   Procedure: ENTEROSCOPY;  Surgeon: Manus Gunning, MD;  Location: [facility] ENDOSCOPY;  Service: Gastroenterology;  Laterality: N/A;  . HOT HEMOSTASIS N/A 12/28/2014   Procedure: HOT HEMOSTASIS (ARGON PLASMA COAGULATION/BICAP);  Surgeon: Inda Castle, MD;  Location: [facility] ENDOSCOPY;  Service: Endoscopy;  Laterality: N/A;  . LEFT HEART CATHETERIZATION WITH CORONARY ANGIOGRAM N/A 04/23/2012   Procedure: LEFT HEART CATHETERIZATION WITH CORONARY ANGIOGRAM;  Surgeon: Larey Dresser, MD;  Location: [facility] CATH LAB;  Service: Cardiovascular;  Laterality: N/A;  . TONSILLECTOMY    . TUBAL LIGATION      reports that she has never smoked. She has never used smokeless tobacco. She reports that she does not drink alcohol or use drugs. family history includes Colon cancer in her brother; Prostate cancer in her brother; Sarcoidosis in her other and sister; Stomach cancer in her mother. Allergies  Allergen Reactions  . Doxycycline Diarrhea    Possible diarrhea    . Biaxin [Clarithromycin] Other (See Comments)    diarrhea  . Metronidazole Other (See Comments)    Pt had difficulty swallowing this and says it was "horrible" to take. No allergic reaction.   Current Outpatient Prescriptions on File Prior to Visit  Medication Sig Dispense Refill  . acetaminophen (TYLENOL) 500 MG tablet Take 2 tablets (1,000 mg total) by mouth 2 (two) times daily. 60 tablet 11  . atorvastatin (LIPITOR) 20 MG tablet TAKE 1 TABLET (20 MG TOTAL) BY MOUTH DAILY. 90 tablet 1  . metoprolol succinate (TOPROL-XL) 25 MG 24 hr tablet Take 1 tablet (25 mg total) by mouth daily. 90 tablet 3  . Multiple Vitamin (MULTIVITAMIN) tablet Take 1 tablet by mouth daily.    . nitroGLYCERIN (NITROSTAT) 0.4 MG SL tablet Place 0.4 mg under the tongue every 5 (five) minutes as needed for chest pain.     No current facility-administered medications on file prior to visit.      Review of Systems  Constitutional: Negative for unusual diaphoresis or night sweats HENT: Negative for ear swelling or discharge Eyes: Negative for worsening visual haziness  Respiratory: Negative for choking and stridor.   Gastrointestinal: Negative for distension or worsening eructation Genitourinary: Negative for retention or change in urine volume.  Musculoskeletal: Negative for other MSK pain or swelling Skin: Negative for color change and worsening wound Neurological: Negative for tremors and numbness other than noted  Psychiatric/Behavioral: Negative for decreased concentration or agitation other than above   All other system neg per pt    Objective:   Physical Exam BP 120/76   Pulse 70   Resp 20   Wt 115 lb (52.2 kg)   SpO2 96%   BMI 21.43 kg/m  VS noted,  Constitutional: Pt appears in no apparent distress HENT: Head: NCAT.  Right Ear: External ear normal.  Left Ear: External ear normal.  Eyes: . Pupils are equal, round, and reactive to light. Conjunctivae and EOM are normal Neck: Normal range of  motion. Neck supple.  Cardiovascular: Normal rate and regular rhythm.   Pulmonary/Chest: Effort normal and breath sounds without rales or wheezing.  Neurological: Pt is alert. Not confused , motor grossly intact MSK: tender bilat trapezoid areas Skin: Skin is warm. No rash, no LE edema Psychiatric: Pt behavior is normal. No agitation. mild nervous No other new exam findings    Assessment & Plan:

## 2016-02-20 NOTE — Progress Notes (Signed)
Pre visit review using our clinic review tool, if applicable. No additional management support is needed unless otherwise documented below in the visit note. 

## 2016-02-21 NOTE — Assessment & Plan Note (Signed)
stable overall by history and exam, recent data reviewed with pt, and pt to continue medical treatment as before,  to f/u any worsening symptoms or concerns Lab Results  Component Value Date   WBC 8.7 02/18/2016   HGB 10.4 (L) 02/18/2016   HCT 31.5 (L) 02/18/2016   MCV 91.6 02/18/2016   PLT 308.0 02/18/2016

## 2016-02-21 NOTE — Assessment & Plan Note (Signed)
stable overall by history and exam, recent data reviewed with pt, and pt to continue medical treatment as before,  to f/u any worsening symptoms or concerns Lab Results  Component Value Date   WBC 8.7 02/18/2016   HGB 10.4 (L) 02/18/2016   HCT 31.5 (L) 02/18/2016   PLT 308.0 02/18/2016   GLUCOSE 106 (H) 01/20/2016   CHOL 160 10/05/2015   TRIG 80.0 10/05/2015   HDL 69.50 10/05/2015   LDLDIRECT 223.7 01/08/2012   LDLCALC 74 10/05/2015   ALT 13 (L) 01/20/2016   AST 18 01/20/2016   NA 135 01/20/2016   K 3.9 01/20/2016   CL 104 01/20/2016   CREATININE 0.88 01/20/2016   BUN 21 (H) 01/20/2016   CO2 23 01/20/2016   TSH 1.29 10/05/2015   INR 1.06 01/20/2016   HGBA1C 6.1 10/05/2015

## 2016-02-21 NOTE — Assessment & Plan Note (Signed)
stable overall by history and exam, recent data reviewed with pt, and pt to continue medical treatment as before,  to f/u any worsening symptoms or concerns BP Readings from Last 3 Encounters:  02/20/16 120/76  01/31/16 130/80  01/22/16 109/65

## 2016-02-21 NOTE — Assessment & Plan Note (Signed)
stable overall by history and exam, recent data reviewed with pt, and pt to continue medical treatment as before,  to f/u any worsening symptoms or concerns Lab Results  Component Value Date   HGBA1C 6.1 10/05/2015

## 2016-03-04 ENCOUNTER — Encounter: Payer: Self-pay | Admitting: Internal Medicine

## 2016-03-04 ENCOUNTER — Ambulatory Visit (INDEPENDENT_AMBULATORY_CARE_PROVIDER_SITE_OTHER): Payer: Medicare Other | Admitting: Internal Medicine

## 2016-03-04 VITALS — BP 120/76 | HR 72 | Temp 98.0°F | Resp 20 | Wt 116.0 lb

## 2016-03-04 DIAGNOSIS — S6991XA Unspecified injury of right wrist, hand and finger(s), initial encounter: Secondary | ICD-10-CM

## 2016-03-04 DIAGNOSIS — S6990XA Unspecified injury of unspecified wrist, hand and finger(s), initial encounter: Secondary | ICD-10-CM | POA: Insufficient documentation

## 2016-03-04 DIAGNOSIS — K5900 Constipation, unspecified: Secondary | ICD-10-CM | POA: Diagnosis not present

## 2016-03-04 DIAGNOSIS — I25810 Atherosclerosis of coronary artery bypass graft(s) without angina pectoris: Secondary | ICD-10-CM | POA: Diagnosis not present

## 2016-03-04 NOTE — Progress Notes (Signed)
Pre visit review using our clinic review tool, if applicable. No additional management support is needed unless otherwise documented below in the visit note. 

## 2016-03-04 NOTE — Patient Instructions (Signed)
OK to take the Miralax 17 gm by mouth daily  Please continue all other medications as before, and refills have been done if requested.  Please have the pharmacy call with any other refills you may need.  Please keep your appointments with your specialists as you may have planned

## 2016-03-04 NOTE — Progress Notes (Signed)
Subjective:    Patient ID: Lindsay Doyle, female    DOB: 25-Feb-1934, 82 y.o.   MRN: [id number]  HPI  Here to f/u c/o pain and stiffness with reduced ROM to PIP of right middle finger, x 2 wks, without fever, hx of gout or trauma. Movement makes worse, rest makes better.  Pt denies chest pain, increased sob or doe, wheezing, orthopnea, PND, increased LE swelling, palpitations, dizziness or syncope.  Also on iron supplement, c/o wrosening constipatoin Past Medical History:  Diagnosis Date  . ANEMIA-IRON DEFICIENCY    presumed GI bleed 08/2012- anticoag stopped  . Asthma    per pt, she does not have asthma  . Atrial fibrillation ([place])    a. Apixaban started 03/2012 - stopped after admx for GI bleed 6/14  . Blood transfusion    a. 05/2010: 3 transfusions for anemia/GIB.  [name] CAD (coronary artery disease)    a. LHC 1/14:  mLAD serial 30 and 60%, oRCA 30%, EF 60%, Afib,   . Cataract   . COLONIC POLYPS, HX OF 03/09/2007  . COPD 03/09/2007   ? pt is unsure  . GERD (gastroesophageal reflux disease)   . GI bleed    a. 05/2010: Hgb 7 at outside hospital s/p 3 u PRBC, EGD 05/2010 mild gastritis, small hiatal hernia, Schatzki ring, negative colonoscopy; capsule endoscopy 06/2010 - possible nonspecific inflammatory changes.;  b.  08/2012: 1/2 guiac +; required transfusion - Apixaban d/c'd  . Hx of echocardiogram    a. echo 2/11:  EF 55-60%, mild AI, mild RAE, mild to mod TR;   b.  a. Echo 2/14:  Mild focal basal and mild LVH, EF 60-65%, mild AI, mild RAE, trivia effusion  . HYPERGLYCEMIA 07/02/2007  . Hyperlipidemia   . Hypertension 11/12/2006  . LEUKOPENIA, MILD 05/30/2008  . Mitral valve prolapse    Remotely - last echo 2011 did not show this  . NSTEMI (non-ST elevated myocardial infarction) ([place]) 03/2012   a. Type II in setting of AF with RVR 03/2012  . OSTEOARTHRITIS 02/19/2009   Spinal  . OSTEOPOROSIS 11/12/2006  . Positive H. pylori test 10/1996  . PSVT    a. Remotely, in setting of anemia.  [name]  Spinal stenosis of lumbar region 07/16/2012  . Transfusion history    7/15    Past Surgical History:  Procedure Laterality Date  . ABDOMINAL HYSTERECTOMY    . CARDIAC CATHETERIZATION  1992   S/P in 1992 at [facility] in [place]. This was negative for any coronary artery disease  . carotid duplex  08/29/2003  . CATARACT EXTRACTION    . ceasarean    . ENTEROSCOPY N/A 10/10/2013   Procedure: ENTEROSCOPY;  Surgeon: Inda Castle, MD;  Location: [facility] ENDOSCOPY;  Service: Endoscopy;  Laterality: N/A;  . ENTEROSCOPY N/A 12/28/2014   Procedure: ENTEROSCOPY;  Surgeon: Inda Castle, MD;  Location: [facility] ENDOSCOPY;  Service: Endoscopy;  Laterality: N/A;  . ENTEROSCOPY N/A 01/20/2016   Procedure: ENTEROSCOPY;  Surgeon: Manus Gunning, MD;  Location: [facility] ENDOSCOPY;  Service: Gastroenterology;  Laterality: N/A;  . HOT HEMOSTASIS N/A 12/28/2014   Procedure: HOT HEMOSTASIS (ARGON PLASMA COAGULATION/BICAP);  Surgeon: Inda Castle, MD;  Location: [facility] ENDOSCOPY;  Service: Endoscopy;  Laterality: N/A;  . LEFT HEART CATHETERIZATION WITH CORONARY ANGIOGRAM N/A 04/23/2012   Procedure: LEFT HEART CATHETERIZATION WITH CORONARY ANGIOGRAM;  Surgeon: Larey Dresser, MD;  Location: [facility] CATH LAB;  Service: Cardiovascular;  Laterality: N/A;  .  TONSILLECTOMY    . TUBAL LIGATION      reports that she has never smoked. She has never used smokeless tobacco. She reports that she does not drink alcohol or use drugs. family history includes Colon cancer in her brother; Prostate cancer in her brother; Sarcoidosis in her other and sister; Stomach cancer in her mother. Allergies  Allergen Reactions  . Doxycycline Diarrhea    Possible diarrhea  . Biaxin [Clarithromycin] Other (See Comments)    diarrhea  . Metronidazole Other (See Comments)    Pt had difficulty swallowing this and says it was "horrible" to take. No allergic reaction.   Current Outpatient Prescriptions on File Prior to Visit    Medication Sig Dispense Refill  . acetaminophen (TYLENOL) 500 MG tablet Take 2 tablets (1,000 mg total) by mouth 2 (two) times daily. 60 tablet 11  . atorvastatin (LIPITOR) 20 MG tablet TAKE 1 TABLET (20 MG TOTAL) BY MOUTH DAILY. 90 tablet 1  . metoprolol succinate (TOPROL-XL) 25 MG 24 hr tablet Take 1 tablet (25 mg total) by mouth daily. 90 tablet 3  . Multiple Vitamin (MULTIVITAMIN) tablet Take 1 tablet by mouth daily.    . nitroGLYCERIN (NITROSTAT) 0.4 MG SL tablet Place 0.4 mg under the tongue every 5 (five) minutes as needed for chest pain.     Current Facility-Administered Medications on File Prior to Visit  Medication Dose Route Frequency Provider Last Rate Last Dose  . ferrous sulfate tablet 325 mg  325 mg Oral TID WC Biagio Borg, MD       Review of Systems All otherwise neg per pt     Objective:   Physical Exam BP 120/76   Pulse 72   Temp 98 F (36.7 C) (Oral)   Resp 20   Wt 116 lb (52.6 kg)   SpO2 98%   BMI 21.62 kg/m  VS noted,  Constitutional: Pt appears in no apparent distress HENT: Head: NCAT.  Right Ear: External ear normal.  Left Ear: External ear normal.  Eyes: . Pupils are equal, round, and reactive to light. Conjunctivae and EOM are normal Neck: Normal range of motion. Neck supple.  Cardiovascular: Normal rate and regular rhythm.   Pulmonary/Chest: Effort normal and breath sounds without rales or wheezing.  Right middle finger PIP with bony swelling, mild tender, reduced ROM Neurological: Pt is alert. Not confused , motor grossly intact Skin: Skin is warm. No rash, no LE edema Psychiatric: Pt behavior is normal. No agitation. mild nervous    Assessment & Plan:

## 2016-03-10 DIAGNOSIS — K5900 Constipation, unspecified: Secondary | ICD-10-CM | POA: Insufficient documentation

## 2016-03-10 NOTE — Assessment & Plan Note (Addendum)
No specific trauma but with PIP finger joint pain, bony swelling, reduced ROM, for volt gel prn or otc topical capsiacin,  to f/u any worsening symptoms or concerns

## 2016-03-10 NOTE — Assessment & Plan Note (Signed)
Mild, to add miralax daily asd,  to f/u any worsening symptoms or concerns

## 2016-03-11 ENCOUNTER — Other Ambulatory Visit (INDEPENDENT_AMBULATORY_CARE_PROVIDER_SITE_OTHER): Payer: Medicare Other

## 2016-03-11 DIAGNOSIS — D649 Anemia, unspecified: Secondary | ICD-10-CM

## 2016-03-11 LAB — CBC WITH DIFFERENTIAL/PLATELET
BASOS ABS: 0 10*3/uL (ref 0.0–0.1)
BASOS PCT: 0.5 % (ref 0.0–3.0)
EOS ABS: 0.2 10*3/uL (ref 0.0–0.7)
Eosinophils Relative: 2.6 % (ref 0.0–5.0)
HEMATOCRIT: 32.7 % — AB (ref 36.0–46.0)
HEMOGLOBIN: 10.8 g/dL — AB (ref 12.0–15.0)
LYMPHS PCT: 42.8 % (ref 12.0–46.0)
Lymphs Abs: 3.8 10*3/uL (ref 0.7–4.0)
MCHC: 33 g/dL (ref 30.0–36.0)
MCV: 93 fl (ref 78.0–100.0)
MONO ABS: 1.4 10*3/uL — AB (ref 0.1–1.0)
Monocytes Relative: 15.2 % — ABNORMAL HIGH (ref 3.0–12.0)
Neutro Abs: 3.5 10*3/uL (ref 1.4–7.7)
Neutrophils Relative %: 38.9 % — ABNORMAL LOW (ref 43.0–77.0)
Platelets: 333 10*3/uL (ref 150.0–400.0)
RBC: 3.51 Mil/uL — ABNORMAL LOW (ref 3.87–5.11)
RDW: 17.1 % — AB (ref 11.5–15.5)
WBC: 9 10*3/uL (ref 4.0–10.5)

## 2016-03-12 ENCOUNTER — Other Ambulatory Visit: Payer: Self-pay

## 2016-03-12 DIAGNOSIS — D649 Anemia, unspecified: Secondary | ICD-10-CM

## 2016-03-17 ENCOUNTER — Telehealth: Payer: Self-pay | Admitting: Internal Medicine

## 2016-03-17 NOTE — Telephone Encounter (Signed)
Spoke with patient regarding annual wellness visit. Patient stated that she has a lot of appointments in the month of Jan 2018 and would like to give office a call back to schedule her awv at a later date.

## 2016-03-22 ENCOUNTER — Ambulatory Visit (INDEPENDENT_AMBULATORY_CARE_PROVIDER_SITE_OTHER): Payer: Medicare Other | Admitting: Primary Care

## 2016-03-22 ENCOUNTER — Encounter: Payer: Self-pay | Admitting: Primary Care

## 2016-03-22 VITALS — BP 120/76 | HR 100 | Temp 98.1°F | Wt 116.5 lb

## 2016-03-22 DIAGNOSIS — R21 Rash and other nonspecific skin eruption: Secondary | ICD-10-CM | POA: Diagnosis not present

## 2016-03-22 DIAGNOSIS — I25810 Atherosclerosis of coronary artery bypass graft(s) without angina pectoris: Secondary | ICD-10-CM

## 2016-03-22 NOTE — Progress Notes (Signed)
Subjective:    Patient ID: Lindsay Doyle, female    DOB: 06-29-1933, 82 y.o.   MRN: [id number]  HPI  Lindsay Doyle is an 82 year old female who presents today with a chief complaint of rash. Her rash is located under the left breast which she first noticed several days ago. Yesterday she noticed burning when she got out of the shower. She denies fevers, rashes elsewhere to her body, erythema, changes to skin texture, breast masses, breast pain. The only difference in her medications is that she's now taking iron three times daily rather than twice daily. She denies changes in soaps, detergents, food, etc. Her rash is not itchy or painful, just was irritating when she started scrubbing it in the shower. She's used bacitracin with improvement.  Review of Systems  Constitutional: Negative for fatigue and fever.  Cardiovascular: Negative for palpitations.  Skin: Positive for color change and rash. Negative for wound.  Neurological: Negative for weakness.       Past Medical History:  Diagnosis Date  . ANEMIA-IRON DEFICIENCY    presumed GI bleed 08/2012- anticoag stopped  . Asthma    per pt, she does not have asthma  . Atrial fibrillation ([place])    a. Apixaban started 03/2012 - stopped after admx for GI bleed 6/14  . Blood transfusion    a. 05/2010: 3 transfusions for anemia/GIB.  [name] CAD (coronary artery disease)    a. LHC 1/14:  mLAD serial 30 and 60%, oRCA 30%, EF 60%, Afib,   . Cataract   . COLONIC POLYPS, HX OF 03/09/2007  . COPD 03/09/2007   ? pt is unsure  . GERD (gastroesophageal reflux disease)   . GI bleed    a. 05/2010: Hgb 7 at outside hospital s/p 3 u PRBC, EGD 05/2010 mild gastritis, small hiatal hernia, Schatzki ring, negative colonoscopy; capsule endoscopy 06/2010 - possible nonspecific inflammatory changes.;  b.  08/2012: 1/2 guiac +; required transfusion - Apixaban d/c'd  . Hx of echocardiogram    a. echo 2/11:  EF 55-60%, mild AI, mild RAE, mild to mod TR;   b.  a. Echo 2/14:   Mild focal basal and mild LVH, EF 60-65%, mild AI, mild RAE, trivia effusion  . HYPERGLYCEMIA 07/02/2007  . Hyperlipidemia   . Hypertension 11/12/2006  . LEUKOPENIA, MILD 05/30/2008  . Mitral valve prolapse    Remotely - last echo 2011 did not show this  . NSTEMI (non-ST elevated myocardial infarction) ([place]) 03/2012   a. Type II in setting of AF with RVR 03/2012  . OSTEOARTHRITIS 02/19/2009   Spinal  . OSTEOPOROSIS 11/12/2006  . Positive H. pylori test 10/1996  . PSVT    a. Remotely, in setting of anemia.  [name] Spinal stenosis of lumbar region 07/16/2012  . Transfusion history    7/15      Social History   Social History  . Marital status: Married    Spouse name: N/A  . Number of children: 1  . Years of education: N/A   Occupational History  .      retired   Social History Main Topics  . Smoking status: Never Smoker  . Smokeless tobacco: Never Used  . Alcohol use No  . Drug use: No  . Sexual activity: Not on file   Other Topics Concern  . Not on file   Social History Narrative  . No narrative on file    Past Surgical History:  Procedure Laterality Date  . ABDOMINAL  HYSTERECTOMY    . CARDIAC CATHETERIZATION  1992   S/P in 1992 at [facility] in [place]. This was negative for any coronary artery disease  . carotid duplex  08/29/2003  . CATARACT EXTRACTION    . ceasarean    . ENTEROSCOPY N/A 10/10/2013   Procedure: ENTEROSCOPY;  Surgeon: Inda Castle, MD;  Location: [facility] ENDOSCOPY;  Service: Endoscopy;  Laterality: N/A;  . ENTEROSCOPY N/A 12/28/2014   Procedure: ENTEROSCOPY;  Surgeon: Inda Castle, MD;  Location: [facility] ENDOSCOPY;  Service: Endoscopy;  Laterality: N/A;  . ENTEROSCOPY N/A 01/20/2016   Procedure: ENTEROSCOPY;  Surgeon: Manus Gunning, MD;  Location: [facility] ENDOSCOPY;  Service: Gastroenterology;  Laterality: N/A;  . HOT HEMOSTASIS N/A 12/28/2014   Procedure: HOT HEMOSTASIS (ARGON PLASMA COAGULATION/BICAP);  Surgeon: Inda Castle,  MD;  Location: [facility];  Service: Endoscopy;  Laterality: N/A;  . LEFT HEART CATHETERIZATION WITH CORONARY ANGIOGRAM N/A 04/23/2012   Procedure: LEFT HEART CATHETERIZATION WITH CORONARY ANGIOGRAM;  Surgeon: Larey Dresser, MD;  Location: [facility] CATH LAB;  Service: Cardiovascular;  Laterality: N/A;  . TONSILLECTOMY    . TUBAL LIGATION      Family History  Problem Relation Age of Onset  . Prostate cancer Brother   . Stomach cancer Mother   . Sarcoidosis Sister   . Colon cancer Brother   . Sarcoidosis Other   . CAD Neg Hx     Allergies  Allergen Reactions  . Doxycycline Diarrhea    Possible diarrhea  . Biaxin [Clarithromycin] Other (See Comments)    diarrhea  . Metronidazole Other (See Comments)    Pt had difficulty swallowing this and says it was "horrible" to take. No allergic reaction.    Current Outpatient Prescriptions on File Prior to Visit  Medication Sig Dispense Refill  . acetaminophen (TYLENOL) 500 MG tablet Take 2 tablets (1,000 mg total) by mouth 2 (two) times daily. 60 tablet 11  . atorvastatin (LIPITOR) 20 MG tablet TAKE 1 TABLET (20 MG TOTAL) BY MOUTH DAILY. 90 tablet 1  . metoprolol succinate (TOPROL-XL) 25 MG 24 hr tablet Take 1 tablet (25 mg total) by mouth daily. 90 tablet 3  . Multiple Vitamin (MULTIVITAMIN) tablet Take 1 tablet by mouth daily.    . nitroGLYCERIN (NITROSTAT) 0.4 MG SL tablet Place 0.4 mg under the tongue every 5 (five) minutes as needed for chest pain.     Current Facility-Administered Medications on File Prior to Visit  Medication Dose Route Frequency Provider Last Rate Last Dose  . ferrous sulfate tablet 325 mg  325 mg Oral TID WC Biagio Borg, MD        BP 120/76 (BP Location: Left Arm, Patient Position: Sitting, Cuff Size: Normal)   Pulse 100   Temp 98.1 F (36.7 C) (Oral)   Wt 116 lb 8 oz (52.8 kg)   SpO2 98%   BMI 21.71 kg/m    Objective:   Physical Exam  Constitutional: She appears well-nourished.  Cardiovascular: Normal  rate and regular rhythm.   Pulmonary/Chest: Effort normal and breath sounds normal.  Skin: Skin is warm and dry. No rash noted. No erythema.  No evidence of rash for which she describes. No erythema, dry skin, skin breakdown.          Assessment & Plan:  Rash:  Located under left breast for the past several days. No significant changes in lifestyle or meds. Exam today without evidence of rash, skin breakdown, erythema, infection. Non  tender. No breast involvement. No systemic signs. Could be heat related rash. Discussed to wear supportive bra, keep area under breasts dry. Continue bacitracin, could try OTC hydrocortisone cream PRN. Return precautions provided.  Sheral Flow, NP

## 2016-03-22 NOTE — Patient Instructions (Signed)
You may try applying hydrocortisone 1% cream twice daily under the breast for any irritative symptoms.   Please notify us if the rash spreads, you notice redness to the skin, you develop fevers.  It was a pleasure meeting you!

## 2016-03-22 NOTE — Progress Notes (Signed)
Pre visit review using our clinic review tool, if applicable. No additional management support is needed unless otherwise documented below in the visit note. 

## 2016-04-04 ENCOUNTER — Encounter: Payer: Self-pay | Admitting: Gastroenterology

## 2016-04-04 ENCOUNTER — Ambulatory Visit (INDEPENDENT_AMBULATORY_CARE_PROVIDER_SITE_OTHER): Payer: Medicare Other | Admitting: Gastroenterology

## 2016-04-04 VITALS — BP 128/74 | HR 92 | Ht 64.0 in | Wt 119.1 lb

## 2016-04-04 DIAGNOSIS — K558 Other vascular disorders of intestine: Secondary | ICD-10-CM | POA: Diagnosis not present

## 2016-04-04 DIAGNOSIS — K5520 Angiodysplasia of colon without hemorrhage: Secondary | ICD-10-CM

## 2016-04-04 DIAGNOSIS — D649 Anemia, unspecified: Secondary | ICD-10-CM | POA: Diagnosis not present

## 2016-04-04 NOTE — Progress Notes (Signed)
HPI :  83 y/o female with history of recurrent GI bleeding, CAD, here for a follow up visit. I met her in October during an inpatient stay for dark stools and worsening anemia. Hgb was noted to be 6 at time of admission. She underwent a push enteroscopy during her admission with findings of active bleeding in the distal duodenum versus proximal jejunum, thought to be most likely an AVM. It was treated with APC, epinephrine, and hemostasis clip with eventual hemostasis. She has eventually resumed her baby aspirin as an outpatient without any recurrence of bleeding.   She has had a prior history of a colonoscopy in March 2012 done for iron deficiency anemia which was a normal exam, and EGD in March 2012 showed gastritis. Capsule endoscopy in 2012 showed some patchy nonspecific erythema. She has also had a prior small bowel enteroscopy in 2016 with Dr. Deatra Ina in which a gastric AVM was noted and treated with APC. Her Hgb has been stable over the past few months around 10 and rising, last checked at 10.8 on 12/12.    She is taking iron 3 times per day. She is taking a small aspirin 11m. She is using only tylenol, no NSAIDs. She is not seeing any blood in the stools. Her stools are always dark due to iron.     Past Medical History:  Diagnosis Date  . ANEMIA-IRON DEFICIENCY    presumed GI bleed 08/2012- anticoag stopped  . Asthma    per pt, she does not have asthma  . Atrial fibrillation ([place]    a. Apixaban started 03/2012 - stopped after admx for GI bleed 6/14  . Blood transfusion    a. 05/2010: 3 transfusions for anemia/GIB.  .Marland KitchenCAD (coronary artery disease)    a. LHC 1/14:  mLAD serial 30 and 60%, oRCA 30%, EF 60%, Afib,   . Cataract   . COLONIC POLYPS, HX OF 03/09/2007  . COPD 03/09/2007   ? pt is unsure  . GERD (gastroesophageal reflux disease)   . GI bleed    a. 05/2010: Hgb 7 at outside hospital s/p 3 u PRBC, EGD 05/2010 mild gastritis, small hiatal hernia, Schatzki ring, negative  colonoscopy; capsule endoscopy 06/2010 - possible nonspecific inflammatory changes.;  b.  08/2012: 1/2 guiac +; required transfusion - Apixaban d/c'd  . Hx of echocardiogram    a. echo 2/11:  EF 55-60%, mild AI, mild RAE, mild to mod TR;   b.  a. Echo 2/14:  Mild focal basal and mild LVH, EF 60-65%, mild AI, mild RAE, trivia effusion  . HYPERGLYCEMIA 07/02/2007  . Hyperlipidemia   . Hypertension 11/12/2006  . LEUKOPENIA, MILD 05/30/2008  . Mitral valve prolapse    Remotely - last echo 2011 did not show this  . NSTEMI (non-ST elevated myocardial infarction) ([place] 03/2012   a. Type II in setting of AF with RVR 03/2012  . OSTEOARTHRITIS 02/19/2009   Spinal  . OSTEOPOROSIS 11/12/2006  . Positive H. pylori test 10/1996  . PSVT    a. Remotely, in setting of anemia.  .Marland KitchenSpinal stenosis of lumbar region 07/16/2012  . Transfusion history    7/15      Past Surgical History:  Procedure Laterality Date  . ABDOMINAL HYSTERECTOMY    . CARDIAC CATHETERIZATION  1992   [facility] This was negative for any coronary artery disease  . carotid duplex  08/29/2003  . CATARACT EXTRACTION    .  ceasarean    . ENTEROSCOPY N/A 10/10/2013   Procedure: ENTEROSCOPY;  Surgeon: Inda Castle, MD;  Location: [facility] ENDOSCOPY;  Service: Endoscopy;  Laterality: N/A;  . ENTEROSCOPY N/A 12/28/2014   Procedure: ENTEROSCOPY;  Surgeon: Inda Castle, MD;  Location: [facility] ENDOSCOPY;  Service: Endoscopy;  Laterality: N/A;  . ENTEROSCOPY N/A 01/20/2016   Procedure: ENTEROSCOPY;  Surgeon: Manus Gunning, MD;  Location: [facility] ENDOSCOPY;  Service: Gastroenterology;  Laterality: N/A;  . HOT HEMOSTASIS N/A 12/28/2014   Procedure: HOT HEMOSTASIS (ARGON PLASMA COAGULATION/BICAP);  Surgeon: Inda Castle, MD;  Location: [facility] ENDOSCOPY;  Service: Endoscopy;  Laterality: N/A;  . LEFT HEART CATHETERIZATION WITH CORONARY ANGIOGRAM N/A 04/23/2012   Procedure: LEFT HEART CATHETERIZATION WITH  CORONARY ANGIOGRAM;  Surgeon: Larey Dresser, MD;  Location: [facility] CATH LAB;  Service: Cardiovascular;  Laterality: N/A;  . TONSILLECTOMY    . TUBAL LIGATION     Family History  Problem Relation Age of Onset  . Prostate cancer Brother   . Stomach cancer Mother   . Sarcoidosis Sister   . Colon cancer Brother   . Sarcoidosis Other   . CAD Neg Hx    Social History  Substance Use Topics  . Smoking status: Never Smoker  . Smokeless tobacco: Never Used  . Alcohol use No   Current Outpatient Prescriptions  Medication Sig Dispense Refill  . acetaminophen (TYLENOL) 500 MG tablet Take 2 tablets (1,000 mg total) by mouth 2 (two) times daily. 60 tablet 11  . atorvastatin (LIPITOR) 20 MG tablet TAKE 1 TABLET (20 MG TOTAL) BY MOUTH DAILY. 90 tablet 1  . ferrous sulfate 325 (65 FE) MG tablet Take 325 mg by mouth 3 (three) times daily with meals.    . metoprolol succinate (TOPROL-XL) 25 MG 24 hr tablet Take 1 tablet (25 mg total) by mouth daily. 90 tablet 3  . Multiple Vitamin (MULTIVITAMIN) tablet Take 1 tablet by mouth daily.    . nitroGLYCERIN (NITROSTAT) 0.4 MG SL tablet Place 0.4 mg under the tongue every 5 (five) minutes as needed for chest pain.     No current facility-administered medications for this visit.    Allergies  Allergen Reactions  . Doxycycline Diarrhea    Possible diarrhea  . Biaxin [Clarithromycin] Other (See Comments)    diarrhea  . Metronidazole Other (See Comments)    Pt had difficulty swallowing this and says it was "horrible" to take. No allergic reaction.     Review of Systems: All systems reviewed and negative except where noted in HPI.   CBC Latest Ref Rng & Units 03/11/2016 02/18/2016 01/31/2016  WBC 4.0 - 10.5 K/uL 9.0 8.7 8.1  Hemoglobin 12.0 - 15.0 g/dL 10.8(L) 10.4(L) 10.0(L)  Hematocrit 36.0 - 46.0 % 32.7(L) 31.5(L) 30.4(L)  Platelets 150.0 - 400.0 K/uL 333.0 308.0 379.0    Lab Results  Component Value Date   CREATININE 0.88 01/20/2016   BUN 21  (H) 01/20/2016   NA 135 01/20/2016   K 3.9 01/20/2016   CL 104 01/20/2016   CO2 23 01/20/2016    Lab Results  Component Value Date   ALT 13 (L) 01/20/2016   AST 18 01/20/2016   ALKPHOS 54 01/20/2016   BILITOT 1.3 (H) 01/20/2016     Physical Exam: BP 128/74   Pulse 92   Ht '5\' 4"'  (1.626 m)   Wt 119 lb 2 oz (54 kg)   BMI 20.45 kg/m  Constitutional: Pleasant,well-developed, female in no acute distress. HEENT: Normocephalic and atraumatic.  Conjunctivae are normal. No scleral icterus. Neck supple.  Cardiovascular: Normal rate, regular rhythm.  Pulmonary/chest: Effort normal and breath sounds normal. No wheezing, rales or rhonchi. Abdominal: Soft, nondistended, nontender. There are no masses palpable. No hepatomegaly. Extremities: no edema Lymphadenopathy: No cervical adenopathy noted. Neurological: Alert and oriented to person place and time. Skin: Skin is warm and dry. No rashes noted. Psychiatric: Normal mood and affect. Behavior is normal.   ASSESSMENT AND PLAN: 83 year old female here for follow-up regarding history of GI bleeding as outlined above. Her last episode of bleeding was in October due to a suspected small bowel AVM, treated endoscopically with good result. Her hemoglobin has risen over time despite resuming her aspirin and she has remained on iron. I reviewed her history of bleeding with her, she is at risk for future GI bleeding from AVMs given this appears to have an at least 2 times in the past. I counseled her on symptoms of bleeding and anemia she will contact me if she has any recurrence. I don't think any further testing is warranted at this time. She is going to continue her iron at this time and we will repeat her CBC in a few weeks to ensure stable. As long as her CBC remains stable and uptrending no other workup will be planned. Once hemoglobin normalizes we'll consider reducing dose of iron or stopping over time. She agreed.  Verona Walk Cellar, MD [facility]  Gastroenterology Pager [phone]

## 2016-04-04 NOTE — Patient Instructions (Signed)
If you are age 81 or older, your body mass index should be between 23-30. Your Body mass index is 20.45 kg/m. If this is out of the aforementioned range listed, please consider follow up with your Primary Care Provider.  If you are age 32 or younger, your body mass index should be between 19-25. Your Body mass index is 20.45 kg/m. If this is out of the aformentioned range listed, please consider follow up with your Primary Care Provider.   Your physician has requested that you go to the basement for lab work in 2 weeks.  Thank you

## 2016-04-09 ENCOUNTER — Encounter: Payer: Self-pay | Admitting: Internal Medicine

## 2016-04-09 ENCOUNTER — Ambulatory Visit (INDEPENDENT_AMBULATORY_CARE_PROVIDER_SITE_OTHER): Payer: Medicare Other | Admitting: Internal Medicine

## 2016-04-09 ENCOUNTER — Other Ambulatory Visit (INDEPENDENT_AMBULATORY_CARE_PROVIDER_SITE_OTHER): Payer: Medicare Other

## 2016-04-09 VITALS — BP 118/72 | HR 88 | Temp 98.2°F | Resp 20 | Wt 117.0 lb

## 2016-04-09 DIAGNOSIS — E785 Hyperlipidemia, unspecified: Secondary | ICD-10-CM

## 2016-04-09 DIAGNOSIS — N184 Chronic kidney disease, stage 4 (severe): Secondary | ICD-10-CM | POA: Diagnosis not present

## 2016-04-09 DIAGNOSIS — E871 Hypo-osmolality and hyponatremia: Secondary | ICD-10-CM

## 2016-04-09 DIAGNOSIS — R739 Hyperglycemia, unspecified: Secondary | ICD-10-CM | POA: Diagnosis not present

## 2016-04-09 DIAGNOSIS — D500 Iron deficiency anemia secondary to blood loss (chronic): Secondary | ICD-10-CM

## 2016-04-09 DIAGNOSIS — I10 Essential (primary) hypertension: Secondary | ICD-10-CM

## 2016-04-09 LAB — BASIC METABOLIC PANEL
BUN: 26 mg/dL — AB (ref 6–23)
CO2: 28 meq/L (ref 19–32)
CREATININE: 1.04 mg/dL (ref 0.40–1.20)
Calcium: 10.2 mg/dL (ref 8.4–10.5)
Chloride: 104 mEq/L (ref 96–112)
GFR: 65.45 mL/min (ref >60.00)
GLUCOSE: 94 mg/dL (ref 70–99)
Potassium: 5 mEq/L (ref 3.5–5.1)
SODIUM: 140 meq/L (ref 135–145)

## 2016-04-09 LAB — CBC WITH DIFFERENTIAL/PLATELET
BASOS ABS: 0 10*3/uL (ref 0.0–0.1)
Basophils Relative: 0.5 % (ref 0.0–3.0)
Eosinophils Absolute: 0.3 10*3/uL (ref 0.0–0.7)
Eosinophils Relative: 4.3 % (ref 0.0–5.0)
HCT: 33.1 % — ABNORMAL LOW (ref 36.0–46.0)
HEMOGLOBIN: 11.2 g/dL — AB (ref 12.0–15.0)
LYMPHS ABS: 1.9 10*3/uL (ref 0.7–4.0)
Lymphocytes Relative: 28.3 % (ref 12.0–46.0)
MCHC: 33.7 g/dL (ref 30.0–36.0)
MCV: 92.6 fl (ref 78.0–100.0)
MONO ABS: 0.8 10*3/uL (ref 0.1–1.0)
Monocytes Relative: 12.4 % — ABNORMAL HIGH (ref 3.0–12.0)
Neutro Abs: 3.6 10*3/uL (ref 1.4–7.7)
Neutrophils Relative %: 54.5 % (ref 43.0–77.0)
Platelets: 317 10*3/uL (ref 150.0–400.0)
RBC: 3.58 Mil/uL — AB (ref 3.87–5.11)
RDW: 16.9 % — ABNORMAL HIGH (ref 11.5–15.5)
WBC: 6.6 10*3/uL (ref 4.0–10.5)

## 2016-04-09 LAB — HEPATIC FUNCTION PANEL
ALBUMIN: 4.3 g/dL (ref 3.5–5.2)
ALK PHOS: 68 U/L (ref 39–117)
ALT: 12 U/L (ref 0–35)
AST: 17 U/L (ref 0–37)
Bilirubin, Direct: 0 mg/dL (ref 0.0–0.3)
TOTAL PROTEIN: 8.4 g/dL — AB (ref 6.0–8.3)
Total Bilirubin: 0.3 mg/dL (ref 0.2–1.2)

## 2016-04-09 LAB — LIPID PANEL
CHOLESTEROL: 177 mg/dL (ref 0–200)
HDL: 74.8 mg/dL (ref >39.00)
LDL CALC: 86 mg/dL (ref 0–99)
NonHDL: 101.75
TRIGLYCERIDES: 79 mg/dL (ref 0.0–149.0)
Total CHOL/HDL Ratio: 2
VLDL: 15.8 mg/dL (ref 0.0–40.0)

## 2016-04-09 LAB — HEMOGLOBIN A1C: HEMOGLOBIN A1C: 5.8 % (ref 4.6–6.5)

## 2016-04-09 LAB — TSH: TSH: 1.5 u[IU]/mL (ref 0.35–4.50)

## 2016-04-09 MED ORDER — CLOTRIMAZOLE-BETAMETHASONE 1-0.05 % EX CREA: TOPICAL_CREAM | CUTANEOUS | 1 refills | Status: DC

## 2016-04-09 NOTE — Assessment & Plan Note (Signed)
Due for f/u lab today, no overt bleeding, to cont same tx, f/u with GI as planned

## 2016-04-09 NOTE — Progress Notes (Signed)
Pre visit review using our clinic review tool, if applicable. No additional management support is needed unless otherwise documented below in the visit note. 

## 2016-04-09 NOTE — Assessment & Plan Note (Signed)
stable overall by history and exam, recent data reviewed with pt, and pt to continue medical treatment as before,  to f/u any worsening symptoms or concerns BP Readings from Last 3 Encounters:  04/09/16 118/72  04/04/16 128/74  03/22/16 120/76

## 2016-04-09 NOTE — Progress Notes (Signed)
Subjective:    Patient ID: Lindsay Doyle, female    DOB: [birth year], 83 y.o.   MRN: [id number]  HPI  Here for yearly f/u;  Overall doing ok;  Pt denies Chest pain, worsening SOB, DOE, wheezing, orthopnea, PND, worsening LE edema, palpitations, dizziness or syncope.  Pt denies neurological change such as new headache, facial or extremity weakness.  Pt denies polydipsia, polyuria, or low sugar symptoms. Pt states overall good compliance with treatment and medications, good tolerability, and has been trying to follow appropriate diet.  Pt denies worsening depressive symptoms, suicidal ideation or panic. No fever, night sweats, wt loss, loss of appetite, or other constitutional symptoms.  Pt states good ability with ADL's, has low fall risk, home safety reviewed and adequate, no other significant changes in hearing or vision, and only occasionally active with exercise, though has only hand djd pain and bilat shoulder aching occas for pain.  No overt bleeding. Has been followed closely per GI with recurrent iron def anemia, now due for lab f/u.  Wt Readings from Last 3 Encounters:  04/09/16 117 lb (53.1 kg)  04/04/16 119 lb 2 oz (54 kg)  03/22/16 116 lb 8 oz (52.8 kg)  Still taking iron sulfate tid.  Due for f/u labs Past Medical History:  Diagnosis Date  . ANEMIA-IRON DEFICIENCY    presumed GI bleed 08/2012- anticoag stopped  . Asthma    per pt, she does not have asthma  . Atrial fibrillation ([place])    a. Apixaban started 03/2012 - stopped after admx for GI bleed 6/14  . Blood transfusion    a. 05/2010: 3 transfusions for anemia/GIB.  [name] CAD (coronary artery disease)    a. LHC 1/14:  mLAD serial 30 and 60%, oRCA 30%, EF 60%, Afib,   . Cataract   . COLONIC POLYPS, HX OF 03/09/2007  . COPD 03/09/2007   ? pt is unsure  . GERD (gastroesophageal reflux disease)   . GI bleed    a. 05/2010: Hgb 7 at outside hospital s/p 3 u PRBC, EGD 05/2010 mild gastritis, small hiatal hernia, Schatzki ring, negative  colonoscopy; capsule endoscopy 06/2010 - possible nonspecific inflammatory changes.;  b.  08/2012: 1/2 guiac +; required transfusion - Apixaban d/c'd  . Hx of echocardiogram    a. echo 2/11:  EF 55-60%, mild AI, mild RAE, mild to mod TR;   b.  a. Echo 2/14:  Mild focal basal and mild LVH, EF 60-65%, mild AI, mild RAE, trivia effusion  . HYPERGLYCEMIA 07/02/2007  . Hyperlipidemia   . Hypertension 11/12/2006  . LEUKOPENIA, MILD 05/30/2008  . Mitral valve prolapse    Remotely - last echo 2011 did not show this  . NSTEMI (non-ST elevated myocardial infarction) ([place]) 03/2012   a. Type II in setting of AF with RVR 03/2012  . OSTEOARTHRITIS 02/19/2009   Spinal  . OSTEOPOROSIS 11/12/2006  . Positive H. pylori test 10/1996  . PSVT    a. Remotely, in setting of anemia.  [name] Spinal stenosis of lumbar region 07/16/2012  . Transfusion history    7/15    Past Surgical History:  Procedure Laterality Date  . ABDOMINAL HYSTERECTOMY    . CARDIAC CATHETERIZATION  1992   S/P in 1992 at [facility] in [place]. This was negative for any coronary artery disease  . carotid duplex  08/29/2003  . CATARACT EXTRACTION    . ceasarean    . ENTEROSCOPY N/A 10/10/2013   Procedure: ENTEROSCOPY;  Surgeon: Inda Castle, MD;  Location: [facility];  Service: Endoscopy;  Laterality: N/A;  . ENTEROSCOPY N/A 12/28/2014   Procedure: ENTEROSCOPY;  Surgeon: Inda Castle, MD;  Location: [facility] ENDOSCOPY;  Service: Endoscopy;  Laterality: N/A;  . ENTEROSCOPY N/A 01/20/2016   Procedure: ENTEROSCOPY;  Surgeon: Manus Gunning, MD;  Location: [facility] ENDOSCOPY;  Service: Gastroenterology;  Laterality: N/A;  . HOT HEMOSTASIS N/A 12/28/2014   Procedure: HOT HEMOSTASIS (ARGON PLASMA COAGULATION/BICAP);  Surgeon: Inda Castle, MD;  Location: [facility] ENDOSCOPY;  Service: Endoscopy;  Laterality: N/A;  . LEFT HEART CATHETERIZATION WITH CORONARY ANGIOGRAM N/A 04/23/2012   Procedure: LEFT HEART CATHETERIZATION WITH CORONARY  ANGIOGRAM;  Surgeon: Larey Dresser, MD;  Location: [facility] CATH LAB;  Service: Cardiovascular;  Laterality: N/A;  . TONSILLECTOMY    . TUBAL LIGATION      reports that she has never smoked. She has never used smokeless tobacco. She reports that she does not drink alcohol or use drugs. family history includes Colon cancer in her brother; Prostate cancer in her brother; Sarcoidosis in her other and sister; Stomach cancer in her mother. Allergies  Allergen Reactions  . Doxycycline Diarrhea    Possible diarrhea  . Biaxin [Clarithromycin] Other (See Comments)    diarrhea  . Metronidazole Other (See Comments)    Pt had difficulty swallowing this and says it was "horrible" to take. No allergic reaction.   Current Outpatient Prescriptions on File Prior to Visit  Medication Sig Dispense Refill  . acetaminophen (TYLENOL) 500 MG tablet Take 2 tablets (1,000 mg total) by mouth 2 (two) times daily. 60 tablet 11  . atorvastatin (LIPITOR) 20 MG tablet TAKE 1 TABLET (20 MG TOTAL) BY MOUTH DAILY. 90 tablet 1  . ferrous sulfate 325 (65 FE) MG tablet Take 325 mg by mouth 3 (three) times daily with meals.    . metoprolol succinate (TOPROL-XL) 25 MG 24 hr tablet Take 1 tablet (25 mg total) by mouth daily. 90 tablet 3  . Multiple Vitamin (MULTIVITAMIN) tablet Take 1 tablet by mouth daily.    . nitroGLYCERIN (NITROSTAT) 0.4 MG SL tablet Place 0.4 mg under the tongue every 5 (five) minutes as needed for chest pain.     No current facility-administered medications on file prior to visit.     Review of Systems Constitutional: Negative for increased diaphoresis, or other activity, appetite or siginficant weight change other than noted HENT: Negative for worsening hearing loss, ear pain, facial swelling, mouth sores and neck stiffness.   Eyes: Negative for other worsening pain, redness or visual disturbance.  Respiratory: Negative for choking or stridor Cardiovascular: Negative for other chest pain and  palpitations.  Gastrointestinal: Negative for worsening diarrhea, blood in stool, or abdominal distention Genitourinary: Negative for hematuria, flank pain or change in urine volume.  Musculoskeletal: Negative for myalgias or other joint complaints.  Skin: Negative for other color change and wound or drainage.  Neurological: Negative for syncope and numbness. other than noted Hematological: Negative for adenopathy. or other swelling Psychiatric/Behavioral: Negative for hallucinations, SI, self-injury, decreased concentration or other worsening agitation.  All other system neg per pt    Objective:   Physical Exam BP 118/72   Pulse 88   Temp 98.2 F (36.8 C) (Oral)   Resp 20   Wt 117 lb (53.1 kg)   SpO2 94%   BMI 20.08 kg/m  VS noted, not ill appearing, thin for ht Constitutional: Pt is oriented to person, place, and time. Appears well-developed and well-nourished,  in no significant distress Head: Normocephalic and atraumatic  Eyes: Conjunctivae and EOM are normal. Pupils are equal, round, and reactive to light Right Ear: External ear normal.  Left Ear: External ear normal Nose: Nose normal.  Mouth/Throat: Oropharynx is clear and moist  Neck: Normal range of motion. Neck supple. No JVD present. No tracheal deviation present or significant neck LA or mass Cardiovascular: Normal rate, regular rhythm, normal heart sounds and intact distal pulses.   Pulmonary/Chest: Effort normal and breath sounds without rales or wheezing  Abdominal: Soft. Bowel sounds are normal. NT. No HSM  Musculoskeletal: Normal range of motion. Exhibits no edema Lymphadenopathy: Has no cervical adenopathy.  Neurological: Pt is alert and oriented to person, place, and time. Pt has normal reflexes. No cranial nerve deficit. Motor grossly intact Skin: Skin is warm and dry. No rash noted or new ulcers Psychiatric:  Has normal mood and affect. Behavior is normal.  No other new exam findings     Assessment & Plan:

## 2016-04-09 NOTE — Assessment & Plan Note (Signed)
stable overall by history and exam, recent data reviewed with pt, and pt to continue medical treatment as before,  to f/u any worsening symptoms or concerns Lab Results  Component Value Date   LDLCALC 74 10/05/2015

## 2016-04-09 NOTE — Assessment & Plan Note (Signed)
Asympt, for f/u lab today,  to f/u any worsening symptoms or concerns j

## 2016-04-09 NOTE — Assessment & Plan Note (Signed)
stable overall by history and exam, recent data reviewed with pt, and pt to continue medical treatment as before,  to f/u any worsening symptoms or concerns Lab Results  Component Value Date   HGBA1C 6.1 10/05/2015

## 2016-04-09 NOTE — Assessment & Plan Note (Signed)
stable overall by history and exam, recent data reviewed with pt, and pt to continue medical treatment as before,  to f/u any worsening symptoms or concerns Lab Results  Component Value Date   CREATININE 0.88 01/20/2016   For f/u lab

## 2016-04-09 NOTE — Patient Instructions (Signed)
Please take all new medication as prescribed - the lotrisone cream  Please continue all other medications as before, and refills have been done if requested.  Please have the pharmacy call with any other refills you may need.  Please continue your efforts at being more active, low cholesterol diet, and weight control.  You are otherwise up to date with prevention measures today.  Please keep your appointments with your specialists as you may have planned  Please go to the LAB in the Basement (turn left off the elevator) for the tests to be done today  You will be contacted by phone if any changes need to be made immediately.  Otherwise, you will receive a letter about your results with an explanation, but please check with MyChart first.  Please remember to sign up for MyChart if you have not done so, as this will be important to you in the future with finding out test results, communicating by private email, and scheduling acute appointments online when needed.  Please return in 6 months, or sooner if needed

## 2016-04-11 ENCOUNTER — Other Ambulatory Visit: Payer: Self-pay

## 2016-04-11 ENCOUNTER — Telehealth: Payer: Self-pay | Admitting: Gastroenterology

## 2016-04-11 NOTE — Telephone Encounter (Signed)
Spoke to patient, she will come to get her CBC in Feb.

## 2016-04-24 ENCOUNTER — Telehealth: Payer: Self-pay | Admitting: Emergency Medicine

## 2016-04-24 NOTE — Telephone Encounter (Signed)
Pt called and stated she would like to get a note stating she can not exercise. If you give her a call when its ready then she will come by to pick it up. Thanks.

## 2016-04-24 NOTE — Telephone Encounter (Signed)
Please clarify  Does she mean she wants a note to excuse her from gym membership?

## 2016-04-25 ENCOUNTER — Encounter: Payer: Self-pay | Admitting: Internal Medicine

## 2016-04-25 NOTE — Telephone Encounter (Signed)
Patient aware.

## 2016-04-25 NOTE — Telephone Encounter (Signed)
Yes this is for gym membership

## 2016-04-25 NOTE — Progress Notes (Signed)
New letter

## 2016-04-25 NOTE — Telephone Encounter (Signed)
Ok , this is done  [employer] to [employer]

## 2016-05-08 ENCOUNTER — Other Ambulatory Visit (INDEPENDENT_AMBULATORY_CARE_PROVIDER_SITE_OTHER): Payer: Medicare Other

## 2016-05-08 DIAGNOSIS — K558 Other vascular disorders of intestine: Secondary | ICD-10-CM

## 2016-05-08 DIAGNOSIS — K5520 Angiodysplasia of colon without hemorrhage: Secondary | ICD-10-CM

## 2016-05-08 DIAGNOSIS — D649 Anemia, unspecified: Secondary | ICD-10-CM

## 2016-05-08 LAB — CBC WITH DIFFERENTIAL/PLATELET
BASOS ABS: 0 10*3/uL (ref 0.0–0.1)
Basophils Relative: 0.7 % (ref 0.0–3.0)
EOS ABS: 0.3 10*3/uL (ref 0.0–0.7)
Eosinophils Relative: 4 % (ref 0.0–5.0)
HCT: 33.6 % — ABNORMAL LOW (ref 36.0–46.0)
Hemoglobin: 11 g/dL — ABNORMAL LOW (ref 12.0–15.0)
LYMPHS ABS: 2.1 10*3/uL (ref 0.7–4.0)
Lymphocytes Relative: 33.3 % (ref 12.0–46.0)
MCHC: 32.8 g/dL (ref 30.0–36.0)
MCV: 94.5 fl (ref 78.0–100.0)
Monocytes Absolute: 1.1 10*3/uL — ABNORMAL HIGH (ref 0.1–1.0)
Monocytes Relative: 16.9 % — ABNORMAL HIGH (ref 3.0–12.0)
NEUTROS ABS: 2.9 10*3/uL (ref 1.4–7.7)
NEUTROS PCT: 45.1 % (ref 43.0–77.0)
PLATELETS: 302 10*3/uL (ref 150.0–400.0)
RBC: 3.56 Mil/uL — AB (ref 3.87–5.11)
RDW: 16.1 % — ABNORMAL HIGH (ref 11.5–15.5)
WBC: 6.4 10*3/uL (ref 4.0–10.5)

## 2016-05-12 ENCOUNTER — Telehealth: Payer: Self-pay | Admitting: Gastroenterology

## 2016-05-12 NOTE — Telephone Encounter (Signed)
Spoke to patient, let her know that Dr. Havery Moros would be back in the office tomorrow and able to review lab work at that time. Would contact her with results.

## 2016-05-13 ENCOUNTER — Encounter: Payer: Self-pay | Admitting: Gastroenterology

## 2016-05-14 ENCOUNTER — Other Ambulatory Visit: Payer: Self-pay

## 2016-05-14 ENCOUNTER — Telehealth: Payer: Self-pay | Admitting: Gastroenterology

## 2016-05-14 DIAGNOSIS — K922 Gastrointestinal hemorrhage, unspecified: Secondary | ICD-10-CM

## 2016-05-14 NOTE — Telephone Encounter (Signed)
Left message for patient, labs stable, continue taking iron supplement and recheck in 3 months. Told her to expect the letter in the mail that details this information. If she has questions to please call the office.

## 2016-05-14 NOTE — Progress Notes (Signed)
Letter mailed

## 2016-05-16 DIAGNOSIS — Z23 Encounter for immunization: Secondary | ICD-10-CM | POA: Diagnosis not present

## 2016-05-16 DIAGNOSIS — B3789 Other sites of candidiasis: Secondary | ICD-10-CM | POA: Diagnosis not present

## 2016-05-19 NOTE — Progress Notes (Addendum)
Subjective:   Lindsay Doyle is a 83 y.o. female who presents for Medicare Annual (Subsequent) preventive examination.  Review of Systems:  No ROS.  Medicare Wellness Visit.  Cardiac Risk Factors include: advanced age (>81men, >19 women);hypertension;dyslipidemia  Sleep patterns: no sleep issues, feels rested on waking, gets up 1-2 times nightly to void and sleeps 9 hours nightly.    Home Safety/Smoke Alarms:  Feels safe in home. Smoke and security alarms in place.   Living environment; residence and Firearm Safety: 1-story house/ trailer, no firearms. Lives with husband Seat Belt Safety/Bike Helmet: Wears seat belt.   Counseling:   Eye Exam- appointment scheduled, 06/18/16 [facility]- Last several years. Patient states she will make an appointment.   Female:   Pap-  N/A     Mammo-  N/A   Dexa scan-        CCS- N/A       Objective:     Vitals: BP 104/68   Pulse 88   Resp 18   Ht 5\' 4"  (1.626 m)   Wt 119 lb (54 kg)   SpO2 99%   BMI 20.43 kg/m   Body mass index is 20.43 kg/m.   Tobacco History  Smoking Status  . Never Smoker  Smokeless Tobacco  . Never Used     Counseling given: Not Answered   Past Medical History:  Diagnosis Date  . ANEMIA-IRON DEFICIENCY    presumed GI bleed 08/2012- anticoag stopped  . Asthma    per pt, she does not have asthma  . Atrial fibrillation ([place])    a. Apixaban started 03/2012 - stopped after admx for GI bleed 6/14  . Blood transfusion    a. 05/2010: 3 transfusions for anemia/GIB.  [name] CAD (coronary artery disease)    a. LHC 1/14:  mLAD serial 30 and 60%, oRCA 30%, EF 60%, Afib,   . Cataract   . COLONIC POLYPS, HX OF 03/09/2007  . COPD 03/09/2007   ? pt is unsure  . GERD (gastroesophageal reflux disease)   . GI bleed    a. 05/2010: Hgb 7 at outside hospital s/p 3 u PRBC, EGD 05/2010 mild gastritis, small hiatal hernia, Schatzki ring, negative colonoscopy; capsule endoscopy 06/2010 - possible nonspecific  inflammatory changes.;  b.  08/2012: 1/2 guiac +; required transfusion - Apixaban d/c'd  . Hx of echocardiogram    a. echo 2/11:  EF 55-60%, mild AI, mild RAE, mild to mod TR;   b.  a. Echo 2/14:  Mild focal basal and mild LVH, EF 60-65%, mild AI, mild RAE, trivia effusion  . HYPERGLYCEMIA 07/02/2007  . Hyperlipidemia   . Hypertension 11/12/2006  . LEUKOPENIA, MILD 05/30/2008  . Mitral valve prolapse    Remotely - last echo 2011 did not show this  . NSTEMI (non-ST elevated myocardial infarction) ([place]) 03/2012   a. Type II in setting of AF with RVR 03/2012  . OSTEOARTHRITIS 02/19/2009   Spinal  . OSTEOPOROSIS 11/12/2006  . Positive H. pylori test 10/1996  . PSVT    a. Remotely, in setting of anemia.  [name] Spinal stenosis of lumbar region 07/16/2012  . Transfusion history    7/15    Past Surgical History:  Procedure Laterality Date  . ABDOMINAL HYSTERECTOMY    . CARDIAC CATHETERIZATION  1992   S/P in 1992 at [facility] in [place]. This was negative for any coronary artery disease  . carotid duplex  08/29/2003  .  CATARACT EXTRACTION    . ceasarean    . ENTEROSCOPY N/A 10/10/2013   Procedure: ENTEROSCOPY;  Surgeon: Inda Castle, MD;  Location: [facility] ENDOSCOPY;  Service: Endoscopy;  Laterality: N/A;  . ENTEROSCOPY N/A 12/28/2014   Procedure: ENTEROSCOPY;  Surgeon: Inda Castle, MD;  Location: [facility] ENDOSCOPY;  Service: Endoscopy;  Laterality: N/A;  . ENTEROSCOPY N/A 01/20/2016   Procedure: ENTEROSCOPY;  Surgeon: Manus Gunning, MD;  Location: [facility] ENDOSCOPY;  Service: Gastroenterology;  Laterality: N/A;  . HOT HEMOSTASIS N/A 12/28/2014   Procedure: HOT HEMOSTASIS (ARGON PLASMA COAGULATION/BICAP);  Surgeon: Inda Castle, MD;  Location: [facility] ENDOS[facility];  Service: Endoscopy;  Laterality: N/A;  . LEFT HEART CATHETERIZATION WITH CORONARY ANGIOGRAM N/A 04/23/2012   Procedure: LEFT HEART CATHETERIZATION WITH CORONARY ANGIOGRAM;  Surgeon: Larey Dresser, MD;  Location: [facility] CATH  LAB;  Service: Cardiovascular;  Laterality: N/A;  . TONSILLECTOMY    . TUBAL LIGATION     Family History  Problem Relation Age of Onset  . Prostate cancer Brother   . Stomach cancer Mother   . Sarcoidosis Sister   . Colon cancer Brother   . Sarcoidosis Other   . CAD Neg Hx    History  Sexual Activity  . Sexual activity: No    Outpatient Encounter Prescriptions as of 05/20/2016  Medication Sig  . acetaminophen (TYLENOL) 500 MG tablet Take 2 tablets (1,000 mg total) by mouth 2 (two) times daily.  [name] aspirin 325 MG tablet Take 325 mg by mouth daily.  [name] atorvastatin (LIPITOR) 20 MG tablet TAKE 1 TABLET (20 MG TOTAL) BY MOUTH DAILY.  . ferrous sulfate 325 (65 FE) MG tablet Take 325 mg by mouth 3 (three) times daily with meals.  [name] ketoconazole (NIZORAL) 2 % cream Apply 1 application topically daily.  . metoprolol succinate (TOPROL-XL) 25 MG 24 hr tablet Take 1 tablet (25 mg total) by mouth daily.  . miconazole (MICOTIN) 2 % powder Apply 2 application topically as needed for itching.  . Multiple Vitamin (MULTIVITAMIN) tablet Take 1 tablet by mouth daily.  . nitroGLYCERIN (NITROSTAT) 0.4 MG SL tablet Place 0.4 mg under the tongue every 5 (five) minutes as needed for chest pain.  . polyethylene glycol (MIRALAX / GLYCOLAX) packet Take 17 g by mouth daily as needed.  . clotrimazole-betamethasone (LOTRISONE) cream Use as directed twice per day as needed (Patient not taking: Reported on 05/20/2016)   No facility-administered encounter medications on file as of 05/20/2016.     Activities of Daily Living In your present state of health, do you have any difficulty performing the following activities: 05/20/2016 01/19/2016  Hearing? N N  Vision? N N  Difficulty concentrating or making decisions? N N  Walking or climbing stairs? N N  Dressing or bathing? N N  Doing errands, shopping? N N  Preparing Food and eating ? N -  Using the Toilet? N -  In the past six months, have you accidently leaked  urine? N -  Do you have problems with loss of bowel control? N -  Managing your Medications? N -  Managing your Finances? N -  Housekeeping or managing your Housekeeping? N -  Some recent data might be hidden    Patient Care Team: Biagio Borg, MD as PCP - General (Internal Medicine)    Assessment:    Physical assessment deferred to PCP.  Exercise Activities and Dietary recommendations Current Exercise Habits: Structured exercise class (goes to [facility]), Type of exercise: yoga;calisthenics, Time (Minutes):  45, Frequency (Times/Week): 4, Weekly Exercise (Minutes/Week): 180, Intensity: Moderate  Diet (meal preparation, eat out, water intake, caffeinated beverages, dairy products, fruits and vegetables): in general, a "healthy" diet  , well balanced, low fat/ cholesterol, low salt Eats fresh fruits and eats an increase amount of green vegetables. Drinks 10 8 ounces glasses of water per day.   Goals    . wants to improve memory          Continue to work puzzles. I will try to do a puzzle per day.      Fall Risk Fall Risk  05/20/2016 04/09/2016 10/05/2015 07/27/2014 05/19/2014  Falls in the past year? No No No No No   Depression Screen PHQ 2/9 Scores 05/20/2016 04/09/2016 10/05/2015 07/27/2014  PHQ - 2 Score 0 0 0 0     Cognitive Function MMSE - Mini Mental State Exam 05/20/2016  Orientation to time 5  Orientation to Place 5  Registration 3  Attention/ Calculation 5  Recall 1  Language- name 2 objects 2  Language- repeat 1  Language- follow 3 step command 3  Language- read & follow direction 1  Write a sentence 1  Copy design 1  Total score 28        Immunization History  Administered Date(s) Administered  . Influenza Split 03/10/2011, 01/08/2012  . Influenza Whole 02/07/2008, 02/05/2009, 02/18/2010  . Influenza, High Dose Seasonal PF 01/25/2013, 03/06/2015  . Influenza,inj,Quad PF,36+ Mos 01/25/2014, 01/03/2016  . Pneumococcal Conjugate-13 02/08/2013  . Pneumococcal  Polysaccharide-23 12/29/1997, 01/08/2012  . Td 07/29/1996, 07/17/2008   Screening Tests Health Maintenance  Topic Date Due  . COLONOSCOPY  04/01/2015  . TETANUS/TDAP  07/18/2018  . INFLUENZA VACCINE  Completed  . DEXA SCAN  Completed  . PNA vac Low Risk Adult  Completed      Plan:     Take blood pressure in the morning and around dinner time. Record values and bring data to upcoming cardiology appointment.   Bring a copy of your advance directives to your next office visit.  Continue doing brain stimulating activities (puzzles, reading, adult coloring books, staying active) to keep memory sharp.   Go to [employer] they have free memory games  Continue to eat heart healthy diet (full of fruits, vegetables, whole grains, lean protein, water--limit salt, fat, and sugar intake) and increase physical activity as tolerated.   During the course of the visit the patient was educated and counseled about the following appropriate screening and preventive services:   Vaccines to include Pneumoccal, Influenza, Hepatitis B, Td, Zostavax, HCV   Cardiovascular Disease  Colorectal cancer screening  Bone density screening  Diabetes screening  Glaucoma screening  Mammography/PAP  Nutrition counseling   Patient Instructions (the written plan) was given to the patient.   Michiel Cowboy, RN  05/20/2016  Medical screening examination/treatment/procedure(s) were performed by non-physician practitioner and as supervising physician I was immediately available for consultation/collaboration. I agree with above. Cathlean Cower, MD

## 2016-05-19 NOTE — Progress Notes (Signed)
Pre visit review using our clinic review tool, if applicable. No additional management support is needed unless otherwise documented below in the visit note. 

## 2016-05-20 ENCOUNTER — Ambulatory Visit (INDEPENDENT_AMBULATORY_CARE_PROVIDER_SITE_OTHER): Payer: Medicare Other | Admitting: *Deleted

## 2016-05-20 ENCOUNTER — Ambulatory Visit (INDEPENDENT_AMBULATORY_CARE_PROVIDER_SITE_OTHER)
Admission: RE | Admit: 2016-05-20 | Discharge: 2016-05-20 | Disposition: A | Discharge status: Home / Self Care | Payer: Medicare Other | Source: Ambulatory Visit | Attending: Internal Medicine | Admitting: Internal Medicine

## 2016-05-20 VITALS — BP 104/68 | HR 88 | Resp 18 | Ht 64.0 in | Wt 119.0 lb

## 2016-05-20 DIAGNOSIS — E2839 Other primary ovarian failure: Secondary | ICD-10-CM | POA: Diagnosis not present

## 2016-05-20 DIAGNOSIS — Z0000 Encounter for general adult medical examination without abnormal findings: Secondary | ICD-10-CM | POA: Diagnosis not present

## 2016-05-20 NOTE — Patient Instructions (Signed)
Take blood pressure in the morning and around dinner time. Record values and bring data to upcoming cardiology appointment.   Bring a copy of your advance directives to your next office visit.  Continue doing brain stimulating activities (puzzles, reading, adult coloring books, staying active) to keep memory sharp.   Go to [employer] they have free memory games  Continue to eat heart healthy diet (full of fruits, vegetables, whole grains, lean protein, water--limit salt, fat, and sugar intake) and increase physical activity as tolerated.  Lindsay Doyle , Thank you for taking time to come for your Medicare Wellness Visit. I appreciate your ongoing commitment to your health goals. Please review the following plan we discussed and let me know if I can assist you in the future.   These are the goals we discussed: Goals    . wants to improve memory          Continue to work puzzles. I will try to do a puzzle per day.       This is a list of the screening recommended for you and due dates:  Health Maintenance  Topic Date Due  . Colon Cancer Screening  04/01/2015  . Tetanus Vaccine  07/18/2018  . Flu Shot  Completed  . DEXA scan (bone density measurement)  Completed  . Pneumonia vaccines  Completed

## 2016-05-27 DIAGNOSIS — Z1272 Encounter for screening for malignant neoplasm of vagina: Secondary | ICD-10-CM | POA: Diagnosis not present

## 2016-05-27 DIAGNOSIS — Z124 Encounter for screening for malignant neoplasm of cervix: Secondary | ICD-10-CM | POA: Diagnosis not present

## 2016-05-27 DIAGNOSIS — Z90712 Acquired absence of cervix with remaining uterus: Secondary | ICD-10-CM | POA: Diagnosis not present

## 2016-05-27 DIAGNOSIS — Z6821 Body mass index (BMI) 21.0-21.9, adult: Secondary | ICD-10-CM | POA: Diagnosis not present

## 2016-05-27 DIAGNOSIS — Z1231 Encounter for screening mammogram for malignant neoplasm of breast: Secondary | ICD-10-CM | POA: Diagnosis not present

## 2016-05-29 ENCOUNTER — Other Ambulatory Visit: Payer: Self-pay | Admitting: Internal Medicine

## 2016-05-29 MED ORDER — ALENDRONATE SODIUM 70 MG PO TABS: 70.0000 mg | ORAL_TABLET | ORAL | 3 refills | Status: DC

## 2016-06-04 ENCOUNTER — Telehealth: Payer: Self-pay | Admitting: Internal Medicine

## 2016-06-04 NOTE — Telephone Encounter (Signed)
Patient states she was told by the pharmacist that fosamax is prescribed along with taking a calcium supplement.  Patient states that she can not take calcium with iron.  Patient is requesting call back in regard before she picks this medication up.

## 2016-06-04 NOTE — Telephone Encounter (Signed)
Routing to dr john---patient has been placed on iron x3 daily since recent blood transfusion by GI doctor---and she was also advised to start taking calcium to enhance fosama

## 2016-06-09 ENCOUNTER — Ambulatory Visit (INDEPENDENT_AMBULATORY_CARE_PROVIDER_SITE_OTHER): Payer: Medicare Other | Admitting: Cardiovascular Disease

## 2016-06-09 ENCOUNTER — Encounter: Payer: Self-pay | Admitting: Cardiovascular Disease

## 2016-06-09 VITALS — BP 110/60 | HR 96 | Ht 64.0 in | Wt 118.0 lb

## 2016-06-09 DIAGNOSIS — I480 Paroxysmal atrial fibrillation: Secondary | ICD-10-CM

## 2016-06-09 NOTE — Patient Instructions (Signed)

## 2016-06-09 NOTE — Progress Notes (Signed)
Cardiology Office Note Date:  06/09/2016   ID:  Lindsay Doyle, Lindsay Doyle 1934-03-05, MRN [id number]  PCP:  Cathlean Cower, MD  Cardiologist:  Sherren Mocha, MD    Chief Complaint  Patient presents with  . paroxysmal atrial fibrillation    follow up     History of Present Illness: Lindsay Doyle is a 83 y.o. female who presents for Follow-up of paroxysmal atrial fibrillation. The patient is previously been followed by Dr. [name] and she presents to establish care with me today.  She was diagnosed with atrial fibrillation in 2014 when she presented with AF with RVR and NSTEMI. She underwent left heart catheterization showing nonobstructive CAD at that time.   The patient has had problems with recurrent GI blood loss. She was initially treated with Axid and but had symptomatic anemia. This is a long-standing issue. She's had multiple blood transfusions most recently she was admitted in October 2016 with symptomatic anemia with hemoglobin of 6 mg/dL. At that time he was taking aspirin. She underwent GI evaluation and was found to have from the small bowel. Aspirin was held and anemia has subsequently improved with oral iron supplementation. She fell back on aspirin 81 mg daily. Blood counts are followed monthly.  The patient has had no recurrent palpitations. States she only has symptoms when she is anemic. Denies chest pain, shortness of breath, edema, orthopnea, or PND.   Past Medical History:  Diagnosis Date  . ANEMIA-IRON DEFICIENCY    presumed GI bleed 08/2012- anticoag stopped  . Asthma    per pt, she does not have asthma  . Atrial fibrillation ([place])    a. Apixaban started 03/2012 - stopped after admx for GI bleed 6/14  . Blood transfusion    a. 05/2010: 3 transfusions for anemia/GIB.  [name] CAD (coronary artery disease)    a. LHC 1/14:  mLAD serial 30 and 60%, oRCA 30%, EF 60%, Afib,   . Cataract   . COLONIC POLYPS, HX OF 03/09/2007  . COPD 03/09/2007   ? pt is unsure  . GERD  (gastroesophageal reflux disease)   . GI bleed    a. 05/2010: Hgb 7 at outside hospital s/p 3 u PRBC, EGD 05/2010 mild gastritis, small hiatal hernia, Schatzki ring, negative colonoscopy; capsule endoscopy 06/2010 - possible nonspecific inflammatory changes.;  b.  08/2012: 1/2 guiac +; required transfusion - Apixaban d/c'd  . Hx of echocardiogram    a. echo 2/11:  EF 55-60%, mild AI, mild RAE, mild to mod TR;   b.  a. Echo 2/14:  Mild focal basal and mild LVH, EF 60-65%, mild AI, mild RAE, trivia effusion  . HYPERGLYCEMIA 07/02/2007  . Hyperlipidemia   . Hypertension 11/12/2006  . LEUKOPENIA, MILD 05/30/2008  . Mitral valve prolapse    Remotely - last echo 2011 did not show this  . NSTEMI (non-ST elevated myocardial infarction) ([place]) 03/2012   a. Type II in setting of AF with RVR 03/2012  . OSTEOARTHRITIS 02/19/2009   Spinal  . OSTEOPOROSIS 11/12/2006  . Positive H. pylori test 10/1996  . PSVT    a. Remotely, in setting of anemia.  [name] Spinal stenosis of lumbar region 07/16/2012  . Transfusion history    7/15     Past Surgical History:  Procedure Laterality Date  . ABDOMINAL HYSTERECTOMY    . CARDIAC CATHETERIZATION  1992   S/P in 1992 at [facility] in [place]. This was negative for any coronary artery disease  .  carotid duplex  08/29/2003  . CATARACT EXTRACTION    . ceasarean    . ENTEROSCOPY N/A 10/10/2013   Procedure: ENTEROSCOPY;  Surgeon: Inda Castle, MD;  Location: [facility] ENDOSCOPY;  Service: Endoscopy;  Laterality: N/A;  . ENTEROSCOPY N/A 12/28/2014   Procedure: ENTEROSCOPY;  Surgeon: Inda Castle, MD;  Location: [facility] ENDOSCOPY;  Service: Endoscopy;  Laterality: N/A;  . ENTEROSCOPY N/A 01/20/2016   Procedure: ENTEROSCOPY;  Surgeon: Manus Gunning, MD;  Location: [facility] ENDOSCOPY;  Service: Gastroenterology;  Laterality: N/A;  . HOT HEMOSTASIS N/A 12/28/2014   Procedure: HOT HEMOSTASIS (ARGON PLASMA COAGULATION/BICAP);  Surgeon: Inda Castle, MD;  Location:  [facility];  Service: Endoscopy;  Laterality: N/A;  . LEFT HEART CATHETERIZATION WITH CORONARY ANGIOGRAM N/A 04/23/2012   Procedure: LEFT HEART CATHETERIZATION WITH CORONARY ANGIOGRAM;  Surgeon: Larey Dresser, MD;  Location: [facility] CATH LAB;  Service: Cardiovascular;  Laterality: N/A;  . TONSILLECTOMY    . TUBAL LIGATION      Current Outpatient Prescriptions  Medication Sig Dispense Refill  . acetaminophen (TYLENOL) 500 MG tablet Take 2 tablets (1,000 mg total) by mouth 2 (two) times daily. 60 tablet 11  . aspirin 325 MG tablet Take 325 mg by mouth daily.    [name] atorvastatin (LIPITOR) 20 MG tablet TAKE 1 TABLET (20 MG TOTAL) BY MOUTH DAILY. 90 tablet 1  . ferrous sulfate 325 (65 FE) MG tablet Take 325 mg by mouth 3 (three) times daily with meals.    . metoprolol succinate (TOPROL-XL) 25 MG 24 hr tablet Take 1 tablet (25 mg total) by mouth daily. 90 tablet 3  . Multiple Vitamin (MULTIVITAMIN) tablet Take 1 tablet by mouth daily.    . nitroGLYCERIN (NITROSTAT) 0.4 MG SL tablet Place 0.4 mg under the tongue every 5 (five) minutes as needed for chest pain.    . polyethylene glycol (MIRALAX / GLYCOLAX) packet Take 17 g by mouth daily as needed (constipation).      No current facility-administered medications for this visit.     Allergies:   Doxycycline; Biaxin [clarithromycin]; and Metronidazole   Social History:  The patient  reports that she has never smoked. She has never used smokeless tobacco. She reports that she does not drink alcohol or use drugs.   Family History:  The patient's  family history includes Colon cancer in her brother; Prostate cancer in her brother; Sarcoidosis in her other and sister; Stomach cancer in her mother.    ROS:  Please see the history of present illness.  All other systems are reviewed and negative.    PHYSICAL EXAM: VS:  BP 110/60   Pulse 96   Ht 5\' 4"  (1.626 m)   Wt 118 lb (53.5 kg)   BMI 20.25 kg/m  , BMI Body mass index is 20.25 kg/m. GEN: Well  nourished, well developed, pleasant elderly woman in no acute distress  HEENT: normal  Neck: no JVD, no masses. No carotid bruits Cardiac: RRR without murmur or gallop                Respiratory:  clear to auscultation bilaterally, normal work of breathing GI: soft, nontender, nondistended, + BS MS: no deformity or atrophy  Ext: no pretibial edema, pedal pulses 2+= bilaterally Skin: warm and dry, no rash Neuro:  Strength and sensation are intact Psych: euthymic mood, full affect  EKG:  EKG is ordered today. The ekg ordered today shows normal sinus rhythm 97 bpm, within normal limits.  Recent Labs: 04/09/2016:  ALT 12; BUN 26; Creatinine, Ser 1.04; Potassium 5.0; Sodium 140; TSH 1.50 05/08/2016: Hemoglobin 11.0; Platelets 302.0   Lipid Panel     Component Value Date/Time   CHOL 177 04/09/2016 0907   TRIG 79.0 04/09/2016 0907   HDL 74.80 04/09/2016 0907   CHOLHDL 2 04/09/2016 0907   VLDL 15.8 04/09/2016 0907   LDLCALC 86 04/09/2016 0907   LDLDIRECT 223.7 01/08/2012 0858      Wt Readings from Last 3 Encounters:  06/09/16 118 lb (53.5 kg)  05/20/16 119 lb (54 kg)  04/09/16 117 lb (53.1 kg)     Cardiac Studies Reviewed: Cardiac CAth 04-23-2012: Procedure: Left Heart Cath, Selective Coronary Angiography, LV angiography  Indication: Chest pain, atrial fibrillation, NSTEMI         Procedural details: The right groin was prepped, draped, and anesthetized with 1% lidocaine. Using modified Seldinger technique, a 5 French sheath was introduced into the right femoral artery. MP catheter was used for coronary angiography and left ventriculography. Catheter exchanges were performed over a guidewire. Mynx closure device was placed.  There were no immediate procedural complications. The patient was transferred to the post catheterization recovery area for further monitoring.    Procedural Findings: Hemodynamics:  AO 152/80 LV 143/12              Coronary angiography: Coronary  dominance: right  Left mainstem: No significant stenosis.   Left anterior descending (LAD): Serial 30% and 60% mid LAD stenoses.   Left circumflex (LCx): Luminal irregularities.  Right coronary artery (RCA): 30% ostial RCA stenosis.  Luminal irregularities in the rest of the vessel.   Left ventriculography: Hand ventriculogram with normal LV systolic function, EF 95% and no regional wall motion abnormalities noted.   Final Conclusions:  Most significant lesion was a moderate 60% mid LAD stenosis.  No severe stenoses.  Suspect atrial fibrillation with RVR was primary trigger of chest pain and elevated troponin (demand ischemia).    Recommendations: Medical management.  Would start apixaban 2.5 mg bid tomorrow morning for atrial fibrillation.  Needs to begin on statin given moderate CAD.   Echo 01-20-2016: Study Conclusions  - Left ventricle: The cavity size was normal. Wall thickness was   increased in a pattern of mild LVH. Systolic function was   vigorous. The estimated ejection fraction was in the range of 65%   to 70%. Wall motion was normal; there were no regional wall   motion abnormalities. Doppler parameters are consistent with   abnormal left ventricular relaxation (grade 1 diastolic   dysfunction). - Aortic valve: There was mild regurgitation. - Tricuspid valve: There was moderate regurgitation. - Pulmonary arteries: Systolic pressure was mildly increased. PA   peak pressure: 36 mm Hg (S). - Pericardium, extracardiac: A trivial pericardial effusion was   identified.  ASSESSMENT AND PLAN: Paroxysmal atrial fibrillation: The patient is unable to tolerate anticoagulation because of recurrent gastrointestinal bleeding. She seems to be tolerating aspirin 81 mg at the present time. She is not having any symptomatic atrial fibrillation on metoprolol succinate 25 mg daily. Past echo and cardiac catheterization studies are reviewed. I will plan to see her back in 6 months.  There is questionable benefit of aspirin for prevention of thromboembolism in atrial fibrillation, and certainly if she has recurrent bleeding this should be discontinued. She is followed closely by Dr. Havery Moros.  This patients CHA2DS2-VASc Score and unadjusted Ischemic Stroke Rate (% per year) is equal to 4.8 % stroke rate/year from a score of 4  Above score calculated as 1 point each if present [CHF, HTN, DM, Vascular=MI/PAD/Aortic Plaque, Age if 65-74, or Female] Above score calculated as 2 points each if present [Age > 75, or Stroke/TIA/TE]  Watchman may be a consideration but she would need to be able to tolerate 6 weeks of anticoagulation and 6 months of dual antiplatelet Rx which I suspect would be difficult for her.   Current medicines are reviewed with the patient today.  The patient does not have concerns regarding medicines.  Labs/ tests ordered today include:   Orders Placed This Encounter  Procedures  . EKG 12-Lead   Disposition:   FU 6 months  Signed, Sherren Mocha, MD  06/09/2016 10:43 AM    [name] [place], [place], [place]  [address] Phone: [phone]; Fax: [phone]

## 2016-06-10 ENCOUNTER — Telehealth: Payer: Self-pay | Admitting: Cardiovascular Disease

## 2016-06-10 MED ORDER — NITROGLYCERIN 0.4 MG SL SUBL: 0.4000 mg | SUBLINGUAL_TABLET | SUBLINGUAL | 2 refills | Status: DC | PRN

## 2016-06-10 NOTE — Telephone Encounter (Signed)
New message     *STAT* If patient is at the pharmacy, call can be transferred to refill team.   1. Which medications need to be refilled? (please list name of each medication and dose if known) nitroGLYCERIN (NITROSTAT) 0.4 MG SL tablet  2. Which pharmacy/location (including street and city if local pharmacy) is medication to be sent to? [facility] on [address]  3. Do they need a 30 day or 90 day supply? 30 day supply

## 2016-06-10 NOTE — Telephone Encounter (Signed)
Follow up  Sent to nurse by accident goes to refill team       *STAT* If patient is at the pharmacy, call can be transferred to refill team.   1. Which medications need to be refilled? (please list name of each medication and dose if known) nitroGLYCERIN (NITROSTAT) 0.4 MG SL tablet  2. Which pharmacy/location (including street and city if local pharmacy) is medication to be sent to? [facility] on [address]  3. Do they need a 30 day or 90 day supply? 30 day supply

## 2016-06-10 NOTE — Telephone Encounter (Signed)
Rx sent to the pharmacy and voicemail left to make the pt aware.

## 2016-06-18 DIAGNOSIS — H02054 Trichiasis without entropian left upper eyelid: Secondary | ICD-10-CM | POA: Diagnosis not present

## 2016-06-18 DIAGNOSIS — H26491 Other secondary cataract, right eye: Secondary | ICD-10-CM | POA: Diagnosis not present

## 2016-06-18 DIAGNOSIS — H52223 Regular astigmatism, bilateral: Secondary | ICD-10-CM | POA: Diagnosis not present

## 2016-06-18 DIAGNOSIS — H524 Presbyopia: Secondary | ICD-10-CM | POA: Diagnosis not present

## 2016-06-18 DIAGNOSIS — Z961 Presence of intraocular lens: Secondary | ICD-10-CM | POA: Diagnosis not present

## 2016-06-18 DIAGNOSIS — H02051 Trichiasis without entropian right upper eyelid: Secondary | ICD-10-CM | POA: Diagnosis not present

## 2016-06-24 DIAGNOSIS — Z23 Encounter for immunization: Secondary | ICD-10-CM | POA: Diagnosis not present

## 2016-06-24 DIAGNOSIS — B3789 Other sites of candidiasis: Secondary | ICD-10-CM | POA: Diagnosis not present

## 2016-06-25 DIAGNOSIS — M26629 Arthralgia of temporomandibular joint, unspecified side: Secondary | ICD-10-CM | POA: Diagnosis not present

## 2016-06-27 ENCOUNTER — Other Ambulatory Visit: Payer: Self-pay | Admitting: Internal Medicine

## 2016-07-23 ENCOUNTER — Telehealth: Payer: Self-pay

## 2016-07-23 NOTE — Telephone Encounter (Signed)
Patient asking if she could have a "urine test".  When I asked what type of urine test she said she did not know, her sister recently had one and she thought she might need one.  She denies any urinary frequency, pain , or urgency. She is advised that she is due for blood work in mid May and that we will call her when it is time to come in. She is encouraged if she does have any urinary complaints to contact her primary care.

## 2016-08-05 ENCOUNTER — Other Ambulatory Visit (INDEPENDENT_AMBULATORY_CARE_PROVIDER_SITE_OTHER): Payer: Medicare Other

## 2016-08-05 DIAGNOSIS — K922 Gastrointestinal hemorrhage, unspecified: Secondary | ICD-10-CM | POA: Diagnosis not present

## 2016-08-05 LAB — CBC WITH DIFFERENTIAL/PLATELET
BASOS ABS: 0.1 10*3/uL (ref 0.0–0.1)
Basophils Relative: 0.9 % (ref 0.0–3.0)
EOS PCT: 2.7 % (ref 0.0–5.0)
Eosinophils Absolute: 0.2 10*3/uL (ref 0.0–0.7)
HCT: 35.6 % — ABNORMAL LOW (ref 36.0–46.0)
HEMOGLOBIN: 11.8 g/dL — AB (ref 12.0–15.0)
Lymphocytes Relative: 34.3 % (ref 12.0–46.0)
Lymphs Abs: 2.1 10*3/uL (ref 0.7–4.0)
MCHC: 33.2 g/dL (ref 30.0–36.0)
MCV: 96.1 fl (ref 78.0–100.0)
MONO ABS: 0.8 10*3/uL (ref 0.1–1.0)
Monocytes Relative: 12.3 % — ABNORMAL HIGH (ref 3.0–12.0)
Neutro Abs: 3.1 10*3/uL (ref 1.4–7.7)
Neutrophils Relative %: 49.8 % (ref 43.0–77.0)
Platelets: 297 10*3/uL (ref 150.0–400.0)
RBC: 3.71 Mil/uL — AB (ref 3.87–5.11)
RDW: 14.9 % (ref 11.5–15.5)
WBC: 6.2 10*3/uL (ref 4.0–10.5)

## 2016-08-07 ENCOUNTER — Other Ambulatory Visit: Payer: Self-pay

## 2016-08-07 DIAGNOSIS — D649 Anemia, unspecified: Secondary | ICD-10-CM

## 2016-09-08 ENCOUNTER — Encounter: Payer: Self-pay | Admitting: Family

## 2016-09-08 ENCOUNTER — Ambulatory Visit (INDEPENDENT_AMBULATORY_CARE_PROVIDER_SITE_OTHER): Payer: Medicare Other | Admitting: Family

## 2016-09-08 DIAGNOSIS — S93432A Sprain of tibiofibular ligament of left ankle, initial encounter: Secondary | ICD-10-CM

## 2016-09-08 DIAGNOSIS — S93492A Sprain of other ligament of left ankle, initial encounter: Secondary | ICD-10-CM

## 2016-09-08 DIAGNOSIS — S93439A Sprain of tibiofibular ligament of unspecified ankle, initial encounter: Secondary | ICD-10-CM | POA: Insufficient documentation

## 2016-09-08 NOTE — Progress Notes (Signed)
Subjective:    Patient ID: Lindsay Doyle, female    DOB: Oct 31, 1933, 83 y.o.   MRN: [id number]  Chief Complaint  Patient presents with  . Ankle Pain    left ankle pain, twisted ankle this morning     HPI:  Lindsay Doyle is a 83 y.o. female who  has a past medical history of ANEMIA-IRON DEFICIENCY; Asthma; Atrial fibrillation ([place]); Blood transfusion; CAD (coronary artery disease); Cataract; COLONIC POLYPS, HX OF (03/09/2007); COPD (03/09/2007); GERD (gastroesophageal reflux disease); GI bleed; echocardiogram; HYPERGLYCEMIA (07/02/2007); Hyperlipidemia; Hypertension (11/12/2006); LEUKOPENIA, MILD (05/30/2008); Mitral valve prolapse; NSTEMI (non-ST elevated myocardial infarction) ([place]) (03/2012); OSTEOARTHRITIS (02/19/2009); OSTEOPOROSIS (11/12/2006); Positive H. pylori test (10/1996); PSVT; Spinal stenosis of lumbar region (07/16/2012); and Transfusion history. and presents today for an acute office visit.  This is a new problem. Associated symptom of pain located in her left ankle started this morning when she inverted her ankle while on the last step. Unsure of any sounds/sensation. Pain is sore with weight bearing and described as soreness. No previous history of ankle injury in the past and no modifying factors that make it better or worse.  Able to weight bear immediately following.   Allergies  Allergen Reactions  . Doxycycline Diarrhea    Possible diarrhea  . Biaxin [Clarithromycin] Other (See Comments)    diarrhea  . Metronidazole Other (See Comments)    Pt had difficulty swallowing this and says it was "horrible" to take. No allergic reaction.      Outpatient Medications Prior to Visit  Medication Sig Dispense Refill  . acetaminophen (TYLENOL) 500 MG tablet Take 2 tablets (1,000 mg total) by mouth 2 (two) times daily. 60 tablet 11  . aspirin 325 MG tablet Take 325 mg by mouth daily.    [name] atorvastatin (LIPITOR) 20 MG tablet TAKE 1 TABLET (20 MG TOTAL) BY MOUTH DAILY. 90 tablet 3    . ferrous sulfate 325 (65 FE) MG tablet Take 325 mg by mouth 3 (three) times daily with meals.    . metoprolol succinate (TOPROL-XL) 25 MG 24 hr tablet Take 1 tablet (25 mg total) by mouth daily. 90 tablet 3  . Multiple Vitamin (MULTIVITAMIN) tablet Take 1 tablet by mouth daily.    . nitroGLYCERIN (NITROSTAT) 0.4 MG SL tablet Place 1 tablet (0.4 mg total) under the tongue every 5 (five) minutes as needed for chest pain (maximum of 3 tablets). 25 tablet 2  . polyethylene glycol (MIRALAX / GLYCOLAX) packet Take 17 g by mouth daily as needed (constipation).      No facility-administered medications prior to visit.       Past Surgical History:  Procedure Laterality Date  . ABDOMINAL HYSTERECTOMY    . CARDIAC CATHETERIZATION  1992   S/P in 1992 at [facility] in [place]. This was negative for any coronary artery disease  . carotid duplex  08/29/2003  . CATARACT EXTRACTION    . ceasarean    . ENTEROSCOPY N/A 10/10/2013   Procedure: ENTEROSCOPY;  Surgeon: Inda Castle, MD;  Location: [facility] ENDOSCOPY;  Service: Endoscopy;  Laterality: N/A;  . ENTEROSCOPY N/A 12/28/2014   Procedure: ENTEROSCOPY;  Surgeon: Inda Castle, MD;  Location: [facility] ENDOSCOPY;  Service: Endoscopy;  Laterality: N/A;  . ENTEROSCOPY N/A 01/20/2016   Procedure: ENTEROSCOPY;  Surgeon: Manus Gunning, MD;  Location: [facility] ENDOSCOPY;  Service: Gastroenterology;  Laterality: N/A;  . HOT HEMOSTASIS N/A 12/28/2014   Procedure: HOT HEMOSTASIS (ARGON PLASMA COAGULATION/BICAP);  Surgeon: Inda Castle, MD;  Location: [facility];  Service: Endoscopy;  Laterality: N/A;  . LEFT HEART CATHETERIZATION WITH CORONARY ANGIOGRAM N/A 04/23/2012   Procedure: LEFT HEART CATHETERIZATION WITH CORONARY ANGIOGRAM;  Surgeon: Larey Dresser, MD;  Location: [facility] CATH LAB;  Service: Cardiovascular;  Laterality: N/A;  . TONSILLECTOMY    . TUBAL LIGATION        Past Medical History:  Diagnosis Date  . ANEMIA-IRON  DEFICIENCY    presumed GI bleed 08/2012- anticoag stopped  . Asthma    per pt, she does not have asthma  . Atrial fibrillation ([place])    a. Apixaban started 03/2012 - stopped after admx for GI bleed 6/14  . Blood transfusion    a. 05/2010: 3 transfusions for anemia/GIB.  [name] CAD (coronary artery disease)    a. LHC 1/14:  mLAD serial 30 and 60%, oRCA 30%, EF 60%, Afib,   . Cataract   . COLONIC POLYPS, HX OF 03/09/2007  . COPD 03/09/2007   ? pt is unsure  . GERD (gastroesophageal reflux disease)   . GI bleed    a. 05/2010: Hgb 7 at outside hospital s/p 3 u PRBC, EGD 05/2010 mild gastritis, small hiatal hernia, Schatzki ring, negative colonoscopy; capsule endoscopy 06/2010 - possible nonspecific inflammatory changes.;  b.  08/2012: 1/2 guiac +; required transfusion - Apixaban d/c'd  . Hx of echocardiogram    a. echo 2/11:  EF 55-60%, mild AI, mild RAE, mild to mod TR;   b.  a. Echo 2/14:  Mild focal basal and mild LVH, EF 60-65%, mild AI, mild RAE, trivia effusion  . HYPERGLYCEMIA 07/02/2007  . Hyperlipidemia   . Hypertension 11/12/2006  . LEUKOPENIA, MILD 05/30/2008  . Mitral valve prolapse    Remotely - last echo 2011 did not show this  . NSTEMI (non-ST elevated myocardial infarction) ([place]) 03/2012   a. Type II in setting of AF with RVR 03/2012  . OSTEOARTHRITIS 02/19/2009   Spinal  . OSTEOPOROSIS 11/12/2006  . Positive H. pylori test 10/1996  . PSVT    a. Remotely, in setting of anemia.  [name] Spinal stenosis of lumbar region 07/16/2012  . Transfusion history    7/15       Review of Systems  Constitutional: Negative for chills and fever.  Musculoskeletal:       Positive for left ankle pain.   Neurological: Negative for weakness and numbness.      Objective:    BP (!) 132/98 (BP Location: Left Arm, Patient Position: Sitting, Cuff Size: Normal)   Pulse 93   Temp 98.4 F (36.9 C) (Oral)   Resp 16   Ht 5\' 4"  (1.626 m)   Wt 115 lb 6.4 oz (52.3 kg)   SpO2 94%   BMI 19.81 kg/m  Nursing  note and vital signs reviewed.  Physical Exam  Constitutional: She is oriented to person, place, and time. She appears well-developed and well-nourished. No distress.  Cardiovascular: Normal rate, regular rhythm, normal heart sounds and intact distal pulses.   Pulmonary/Chest: Effort normal and breath sounds normal.  Musculoskeletal:  Left ankle - No obvious deformity or discoloration with mild/moderate edema of the ankle around the anterior-inferior tibiofibular ligament and syndesmosis. Range of motion within normal limits with discomfort in plantar flexion and inversion. Strength is normal. Pulses are intact and appropriate. Negative anterior draw and talar tilt. Positive Kleiger's.    Neurological: She is alert and oriented to person, place, and time.  Skin: Skin is  warm and dry.  Psychiatric: She has a normal mood and affect. Her behavior is normal. Judgment and thought content normal.       Assessment & Plan:   Problem List Items Addressed This Visit      Musculoskeletal and Integument   High ankle sprain    High ankle sprain secondary to inversion mechanism. Treat conservatively with ice, compression, and elevation. Tylenol as needed for pain. Recommend good supportive shoe. Consider ankle brace/suport.  Follow up in 3 weeks or sooner if needed.           I have discontinued Ms. Hamad's polyethylene glycol. I am also having her maintain her multivitamin, acetaminophen, metoprolol succinate, ferrous sulfate, aspirin, nitroGLYCERIN, and atorvastatin.   Follow-up: Return in about 3 weeks (around 09/29/2016), or if symptoms worsen or fail to improve.  Mauricio Po, FNP

## 2016-09-08 NOTE — Assessment & Plan Note (Signed)
High ankle sprain secondary to inversion mechanism. Treat conservatively with ice, compression, and elevation. Tylenol as needed for pain. Recommend good supportive shoe. Consider ankle brace/suport.  Follow up in 3 weeks or sooner if needed.

## 2016-09-08 NOTE — Patient Instructions (Signed)
Thank you for choosing [facility].  SUMMARY AND INSTRUCTIONS:  Ice x 20 minutes every 2 hours and as needed for pain.  Tylenol 500 mg 3-4 times per day.  Ace Wrap from the toes upward.  Elevate your leg when seated.  Follow up in 2-3 weeks or sooner if needed.    Follow up:  If your symptoms worsen or fail to improve, please contact our office for further instruction, or in case of emergency go directly to the emergency room at the closest medical facility.     Ankle Sprain, Phase I Rehab Ask your health care provider which exercises are safe for you. Do exercises exactly as told by your health care provider and adjust them as directed. It is normal to feel mild stretching, pulling, tightness, or discomfort as you do these exercises, but you should stop right away if you feel sudden pain or your pain gets worse.Do not begin these exercises until told by your health care provider. Stretching and range of motion exercises These exercises warm up your muscles and joints and improve the movement and flexibility of your lower leg and ankle. These exercises also help to relieve pain and stiffness. Exercise A: Gastroc and soleus stretch  1. Sit on the floor with your left / right leg extended. 2. Loop a belt or towel around the ball of your left / right foot. The ball of your foot is on the walking surface, right under your toes. 3. Keep your left / right ankle and foot relaxed and keep your knee straight while you use the belt or towel to pull your foot toward you. You should feel a gentle stretch behind your calf or knee. 4. Hold this position for __________ seconds, then release to the starting position. Repeat the exercise with your knee bent. You can put a pillow or a rolled bath towel under your knee to support it. You should feel a stretch deep in your calf or at your Achilles tendon. Repeat each stretch __________ times. Complete these stretches __________ times a  day. Exercise B: Ankle alphabet  1. Sit with your left / right leg supported at the lower leg. ? Do not rest your foot on anything. ? Make sure your foot has room to move freely. 2. Think of your left / right foot as a paintbrush, and move your foot to trace each letter of the alphabet in the air. Keep your hip and knee still while you trace. Make the letters as large as you can without feeling discomfort. 3. Trace every letter from A to Z. Repeat __________ times. Complete this exercise __________ times a day. Strengthening exercises These exercises build strength and endurance in your ankle and lower leg. Endurance is the ability to use your muscles for a long time, even after they get tired. Exercise C: Dorsiflexors  1. Secure a rubber exercise band or tube to an object, such as a table leg, that will stay still when the band is pulled. Secure the other end around your left / right foot. 2. Sit on the floor facing the object, with your left / right leg extended. The band or tube should be slightly tense when your foot is relaxed. 3. Slowly bring your foot toward you, pulling the band tighter. 4. Hold this position for __________ seconds. 5. Slowly return your foot to the starting position. Repeat __________ times. Complete this exercise __________ times a day. Exercise D: Plantar flexors  1. Sit on the floor with your  left / right leg extended. 2. Loop a rubber exercise tube or band around the ball of your left / right foot. The ball of your foot is on the walking surface, right under your toes. ? Hold the ends of the band or tube in your hands. ? The band or tube should be slightly tense when your foot is relaxed. 3. Slowly point your foot and toes downward, pushing them away from you. 4. Hold this position for __________ seconds. 5. Slowly return your foot to the starting position. Repeat __________ times. Complete this exercise __________ times a day. Exercise E: Evertors 1. Sit  on the floor with your legs straight out in front of you. 2. Loop a rubber exercise band or tube around the ball of your left / right foot. The ball of your foot is on the walking surface, right under your toes. ? Hold the ends of the band in your hands, or secure the band to a stable object. ? The band or tube should be slightly tense when your foot is relaxed. 3. Slowly push your foot outward, away from your other leg. 4. Hold this position for __________ seconds. 5. Slowly return your foot to the starting position. Repeat __________ times. Complete this exercise __________ times a day. This information is not intended to replace advice given to you by your health care provider. Make sure you discuss any questions you have with your health care provider. Document Released: 10/16/2004 Document Revised: 11/22/2015 Document Reviewed: 01/29/2015 Elsevier Interactive Patient Education  2018 [employer].

## 2016-09-24 ENCOUNTER — Other Ambulatory Visit (INDEPENDENT_AMBULATORY_CARE_PROVIDER_SITE_OTHER): Payer: Medicare Other

## 2016-09-24 ENCOUNTER — Ambulatory Visit (INDEPENDENT_AMBULATORY_CARE_PROVIDER_SITE_OTHER): Payer: Medicare Other | Admitting: Internal Medicine

## 2016-09-24 ENCOUNTER — Encounter: Payer: Self-pay | Admitting: Internal Medicine

## 2016-09-24 VITALS — BP 124/78 | HR 95 | Ht 64.0 in | Wt 115.0 lb

## 2016-09-24 DIAGNOSIS — R300 Dysuria: Secondary | ICD-10-CM | POA: Diagnosis not present

## 2016-09-24 DIAGNOSIS — N184 Chronic kidney disease, stage 4 (severe): Secondary | ICD-10-CM | POA: Diagnosis not present

## 2016-09-24 DIAGNOSIS — I10 Essential (primary) hypertension: Secondary | ICD-10-CM

## 2016-09-24 DIAGNOSIS — R350 Frequency of micturition: Secondary | ICD-10-CM

## 2016-09-24 DIAGNOSIS — R739 Hyperglycemia, unspecified: Secondary | ICD-10-CM | POA: Diagnosis not present

## 2016-09-24 DIAGNOSIS — E785 Hyperlipidemia, unspecified: Secondary | ICD-10-CM

## 2016-09-24 DIAGNOSIS — D500 Iron deficiency anemia secondary to blood loss (chronic): Secondary | ICD-10-CM | POA: Diagnosis not present

## 2016-09-24 MED ORDER — CEPHALEXIN 500 MG PO CAPS: 500.0000 mg | ORAL_CAPSULE | Freq: Three times a day (TID) | ORAL | 0 refills | Status: AC

## 2016-09-24 MED ORDER — CEFTRIAXONE SODIUM 1 G IJ SOLR
1.0000 g | Freq: Once | INTRAMUSCULAR | Status: AC
  Administered 2016-09-24: 1 g via INTRAMUSCULAR

## 2016-09-24 NOTE — Progress Notes (Signed)
Subjective:    Patient ID: Lindsay Doyle, female    DOB: 1933-12-02, 83 y.o.   MRN: [id number]  HPI  Here to f/u; overall doing ok,  Pt denies chest pain, increasing sob or doe, wheezing, orthopnea, PND, increased LE swelling, palpitations, dizziness or syncope.  Pt denies new neurological symptoms such as new headache, or facial or extremity weakness or numbness.  Pt denies polydipsia, polyuria, or low sugar episode.  Pt states overall good compliance with meds, mostly trying to follow appropriate diet, with wt overall stable, tryin to stay as active as she can.  Pt denies fever, wt loss, night sweats, loss of appetite, or other constitutional symptoms  No other new hx except 2 days onset mild dysuria, frequency, without urgency, flank pain, hematuria or n/v, fever, chills, but just feels terrible Past Medical History:  Diagnosis Date  . ANEMIA-IRON DEFICIENCY    presumed GI bleed 08/2012- anticoag stopped  . Asthma    per pt, she does not have asthma  . Atrial fibrillation ([place])    a. Apixaban started 03/2012 - stopped after admx for GI bleed 6/14  . Blood transfusion    a. 05/2010: 3 transfusions for anemia/GIB.  [name] CAD (coronary artery disease)    a. LHC 1/14:  mLAD serial 30 and 60%, oRCA 30%, EF 60%, Afib,   . Cataract   . COLONIC POLYPS, HX OF 03/09/2007  . COPD 03/09/2007   ? pt is unsure  . GERD (gastroesophageal reflux disease)   . GI bleed    a. 05/2010: Hgb 7 at outside hospital s/p 3 u PRBC, EGD 05/2010 mild gastritis, small hiatal hernia, Schatzki ring, negative colonoscopy; capsule endoscopy 06/2010 - possible nonspecific inflammatory changes.;  b.  08/2012: 1/2 guiac +; required transfusion - Apixaban d/c'd  . Hx of echocardiogram    a. echo 2/11:  EF 55-60%, mild AI, mild RAE, mild to mod TR;   b.  a. Echo 2/14:  Mild focal basal and mild LVH, EF 60-65%, mild AI, mild RAE, trivia effusion  . HYPERGLYCEMIA 07/02/2007  . Hyperlipidemia   . Hypertension 11/12/2006  . LEUKOPENIA,  MILD 05/30/2008  . Mitral valve prolapse    Remotely - last echo 2011 did not show this  . NSTEMI (non-ST elevated myocardial infarction) ([place]) 03/2012   a. Type II in setting of AF with RVR 03/2012  . OSTEOARTHRITIS 02/19/2009   Spinal  . OSTEOPOROSIS 11/12/2006  . Positive H. pylori test 10/1996  . PSVT    a. Remotely, in setting of anemia.  [name] Spinal stenosis of lumbar region 07/16/2012  . Transfusion history    7/15    Past Surgical History:  Procedure Laterality Date  . ABDOMINAL HYSTERECTOMY    . CARDIAC CATHETERIZATION  1992   S/P in 1992 at [facility] in [place]. This was negative for any coronary artery disease  . carotid duplex  08/29/2003  . CATARACT EXTRACTION    . ceasarean    . ENTEROSCOPY N/A 10/10/2013   Procedure: ENTEROSCOPY;  Surgeon: Inda Castle, MD;  Location: [facility] ENDOSCOPY;  Service: Endoscopy;  Laterality: N/A;  . ENTEROSCOPY N/A 12/28/2014   Procedure: ENTEROSCOPY;  Surgeon: Inda Castle, MD;  Location: [facility] ENDOSCOPY;  Service: Endoscopy;  Laterality: N/A;  . ENTEROSCOPY N/A 01/20/2016   Procedure: ENTEROSCOPY;  Surgeon: Manus Gunning, MD;  Location: [facility] ENDOSCOPY;  Service: Gastroenterology;  Laterality: N/A;  . HOT HEMOSTASIS N/A 12/28/2014   Procedure: HOT  HEMOSTASIS (ARGON PLASMA COAGULATION/BICAP);  Surgeon: Inda Castle, MD;  Location: [facility] ENDOSCOPY;  Service: Endoscopy;  Laterality: N/A;  . LEFT HEART CATHETERIZATION WITH CORONARY ANGIOGRAM N/A 04/23/2012   Procedure: LEFT HEART CATHETERIZATION WITH CORONARY ANGIOGRAM;  Surgeon: Larey Dresser, MD;  Location: [facility] CATH LAB;  Service: Cardiovascular;  Laterality: N/A;  . TONSILLECTOMY    . TUBAL LIGATION      reports that she has never smoked. She has never used smokeless tobacco. She reports that she does not drink alcohol or use drugs. family history includes Colon cancer in her brother; Prostate cancer in her brother; Sarcoidosis in her other and sister; Stomach cancer in  her mother. Allergies  Allergen Reactions  . Doxycycline Diarrhea    Possible diarrhea  . Biaxin [Clarithromycin] Other (See Comments)    diarrhea  . Metronidazole Other (See Comments)    Pt had difficulty swallowing this and says it was "horrible" to take. No allergic reaction.   ' Current Outpatient Prescriptions on File Prior to Visit  Medication Sig Dispense Refill  . acetaminophen (TYLENOL) 500 MG tablet Take 2 tablets (1,000 mg total) by mouth 2 (two) times daily. 60 tablet 11  . aspirin 325 MG tablet Take 325 mg by mouth daily.    [name] atorvastatin (LIPITOR) 20 MG tablet TAKE 1 TABLET (20 MG TOTAL) BY MOUTH DAILY. 90 tablet 3  . ferrous sulfate 325 (65 FE) MG tablet Take 325 mg by mouth 3 (three) times daily with meals.    . metoprolol succinate (TOPROL-XL) 25 MG 24 hr tablet Take 1 tablet (25 mg total) by mouth daily. 90 tablet 3  . Multiple Vitamin (MULTIVITAMIN) tablet Take 1 tablet by mouth daily.    . nitroGLYCERIN (NITROSTAT) 0.4 MG SL tablet Place 1 tablet (0.4 mg total) under the tongue every 5 (five) minutes as needed for chest pain (maximum of 3 tablets). 25 tablet 2   No current facility-administered medications on file prior to visit.    Review of Systems  Constitutional: Negative for other unusual diaphoresis or sweats HENT: Negative for ear discharge or swelling Eyes: Negative for other worsening visual disturbances Respiratory: Negative for stridor or other swelling  Gastrointestinal: Negative for worsening distension or other blood Genitourinary: Negative for retention or other urinary change Musculoskeletal: Negative for other MSK pain or swelling Skin: Negative for color change or other new lesions Neurological: Negative for worsening tremors and other numbness  Psychiatric/Behavioral: Negative for worsening agitation or other fatigue All other system eng per pt    Objective:   Physical Exam BP 124/78   Pulse 95   Ht 5\' 4"  (1.626 m)   Wt 115 lb (52.2 kg)    SpO2 98%   BMI 19.74 kg/m  VS noted,  Constitutional: Pt appears in NAD HENT: Head: NCAT.  Right Ear: External ear normal.  Left Ear: External ear normal.  Eyes: . Pupils are equal, round, and reactive to light. Conjunctivae and EOM are normal Nose: without d/c or deformity Neck: Neck supple. Gross normal ROM Cardiovascular: Normal rate and regular rhythm.   Pulmonary/Chest: Effort normal and breath sounds without rales or wheezing.  Abd : soft + BS, mild low mid abd tender, no guarding or rebound Neurological: Pt is alert. At baseline orientation, motor grossly intact Skin: Skin is warm. No rashes, other new lesions, no LE edema Psychiatric: Pt behavior is normal without agitation  No other exam findings  Lab Results  Component Value Date   WBC 5.8 09/25/2016  HGB 11.7 (L) 09/25/2016   HCT 35.0 (L) 09/25/2016   PLT 261.0 09/25/2016   GLUCOSE 99 09/25/2016   CHOL 172 09/25/2016   TRIG 120.0 09/25/2016   HDL 75.40 09/25/2016   LDLDIRECT 223.7 01/08/2012   LDLCALC 73 09/25/2016   ALT 13 09/25/2016   AST 17 09/25/2016   NA 132 (L) 09/25/2016   K 3.9 09/25/2016   CL 95 (L) 09/25/2016   CREATININE 1.06 09/25/2016   BUN 14 09/25/2016   CO2 28 09/25/2016   TSH 1.24 09/25/2016   INR 1.06 01/20/2016   HGBA1C 6.2 09/25/2016       Assessment & Plan:

## 2016-09-24 NOTE — Patient Instructions (Addendum)
You had the antibiotic shot today (rocephin)  Please take all new medication as prescribed - the antibiotic  Please continue all other medications as before, and refills have been done if requested.  Please have the pharmacy call with any other refills you may need.  Please continue your efforts at being more active, low cholesterol diet, and weight control  Please keep your appointments with your specialists as you may have planned  Please go to the LAB in the Basement (turn left off the elevator) for the tests to be done   You will be contacted by phone if any changes need to be made immediately.  Otherwise, you will receive a letter about your results with an explanation, but please check with MyChart first.  Please remember to sign up for MyChart if you have not done so, as this will be important to you in the future with finding out test results, communicating by private email, and scheduling acute appointments online when needed.

## 2016-09-25 ENCOUNTER — Other Ambulatory Visit (INDEPENDENT_AMBULATORY_CARE_PROVIDER_SITE_OTHER): Payer: Medicare Other

## 2016-09-25 DIAGNOSIS — D500 Iron deficiency anemia secondary to blood loss (chronic): Secondary | ICD-10-CM | POA: Diagnosis not present

## 2016-09-25 DIAGNOSIS — I10 Essential (primary) hypertension: Secondary | ICD-10-CM

## 2016-09-25 DIAGNOSIS — R739 Hyperglycemia, unspecified: Secondary | ICD-10-CM

## 2016-09-25 LAB — URINALYSIS, ROUTINE W REFLEX MICROSCOPIC
BILIRUBIN URINE: NEGATIVE
Ketones, ur: NEGATIVE
LEUKOCYTES UA: NEGATIVE
NITRITE: NEGATIVE
Specific Gravity, Urine: <=1.005 — AB (ref 1.000–1.030)
Total Protein, Urine: NEGATIVE
Urine Glucose: NEGATIVE
Urobilinogen, UA: 0.2 (ref 0.0–1.0)
WBC UA: NONE SEEN (ref 0–?)
pH: 6.5 (ref 5.0–8.0)

## 2016-09-25 LAB — HEPATIC FUNCTION PANEL
ALBUMIN: 4.3 g/dL (ref 3.5–5.2)
ALT: 13 U/L (ref 0–35)
AST: 17 U/L (ref 0–37)
Alkaline Phosphatase: 64 U/L (ref 39–117)
Bilirubin, Direct: 0.1 mg/dL (ref 0.0–0.3)
TOTAL PROTEIN: 7.7 g/dL (ref 6.0–8.3)
Total Bilirubin: 0.4 mg/dL (ref 0.2–1.2)

## 2016-09-25 LAB — BASIC METABOLIC PANEL
BUN: 14 mg/dL (ref 6–23)
CALCIUM: 9.8 mg/dL (ref 8.4–10.5)
CO2: 28 meq/L (ref 19–32)
Chloride: 95 mEq/L — ABNORMAL LOW (ref 96–112)
Creatinine, Ser: 1.06 mg/dL (ref 0.40–1.20)
GFR: 63.95 mL/min (ref >60.00)
GLUCOSE: 99 mg/dL (ref 70–99)
POTASSIUM: 3.9 meq/L (ref 3.5–5.1)
Sodium: 132 mEq/L — ABNORMAL LOW (ref 135–145)

## 2016-09-25 LAB — CBC WITH DIFFERENTIAL/PLATELET
BASOS ABS: 0.1 10*3/uL (ref 0.0–0.1)
Basophils Relative: 1 % (ref 0.0–3.0)
Eosinophils Absolute: 0.2 10*3/uL (ref 0.0–0.7)
Eosinophils Relative: 2.7 % (ref 0.0–5.0)
HCT: 35 % — ABNORMAL LOW (ref 36.0–46.0)
HEMOGLOBIN: 11.7 g/dL — AB (ref 12.0–15.0)
Lymphocytes Relative: 44.5 % (ref 12.0–46.0)
Lymphs Abs: 2.6 10*3/uL (ref 0.7–4.0)
MCHC: 33.4 g/dL (ref 30.0–36.0)
MCV: 95 fl (ref 78.0–100.0)
MONO ABS: 0.7 10*3/uL (ref 0.1–1.0)
Monocytes Relative: 12.3 % — ABNORMAL HIGH (ref 3.0–12.0)
Neutro Abs: 2.3 10*3/uL (ref 1.4–7.7)
Neutrophils Relative %: 39.5 % — ABNORMAL LOW (ref 43.0–77.0)
Platelets: 261 10*3/uL (ref 150.0–400.0)
RBC: 3.68 Mil/uL — AB (ref 3.87–5.11)
RDW: 14.5 % (ref 11.5–15.5)
WBC: 5.8 10*3/uL (ref 4.0–10.5)

## 2016-09-25 LAB — LIPID PANEL
CHOLESTEROL: 172 mg/dL (ref 0–200)
HDL: 75.4 mg/dL (ref >39.00)
LDL CALC: 73 mg/dL (ref 0–99)
NonHDL: 96.89
TRIGLYCERIDES: 120 mg/dL (ref 0.0–149.0)
Total CHOL/HDL Ratio: 2
VLDL: 24 mg/dL (ref 0.0–40.0)

## 2016-09-25 LAB — IBC PANEL
IRON: 74 ug/dL (ref 42–145)
SATURATION RATIOS: 19.9 % — AB (ref 20.0–50.0)
TRANSFERRIN: 266 mg/dL (ref 212.0–360.0)

## 2016-09-25 LAB — TSH: TSH: 1.24 u[IU]/mL (ref 0.35–4.50)

## 2016-09-25 LAB — URINE CULTURE: ORGANISM ID, BACTERIA: NO GROWTH

## 2016-09-25 LAB — HEMOGLOBIN A1C: Hgb A1c MFr Bld: 6.2 % (ref 4.6–6.5)

## 2016-09-27 DIAGNOSIS — R300 Dysuria: Secondary | ICD-10-CM | POA: Insufficient documentation

## 2016-09-27 NOTE — Assessment & Plan Note (Signed)
stable overall by history and exam, recent data reviewed with pt, and pt to continue medical treatment as before,  to f/u any worsening symptoms or concerns, for f/u lab Lab Results  Component Value Date   WBC 5.8 09/25/2016   HGB 11.7 (L) 09/25/2016   HCT 35.0 (L) 09/25/2016   MCV 95.0 09/25/2016   PLT 261.0 09/25/2016

## 2016-09-27 NOTE — Assessment & Plan Note (Signed)
?   UTI, for rocephin 1 gm IM, empiric antibx, f/u urine studies

## 2016-09-27 NOTE — Assessment & Plan Note (Signed)
stable overall by history and exam, recent data reviewed with pt, and pt to continue medical treatment as before,  to f/u any worsening symptoms or concerns Lab Results  Component Value Date   HGBA1C 6.2 09/25/2016

## 2016-09-27 NOTE — Assessment & Plan Note (Signed)
stable overall by history and exam, recent data reviewed with pt, and pt to continue medical treatment as before,  to f/u any worsening symptoms or concerns Lab Results  Component Value Date   CREATININE 1.06 09/25/2016

## 2016-09-27 NOTE — Assessment & Plan Note (Signed)
stable overall by history and exam, recent data reviewed with pt, and pt to continue medical treatment as before,  to f/u any worsening symptoms or concerns Lab Results  Component Value Date   LDLCALC 73 09/25/2016

## 2016-10-21 ENCOUNTER — Ambulatory Visit: Payer: Medicare Other | Admitting: Internal Medicine

## 2016-11-10 ENCOUNTER — Other Ambulatory Visit (INDEPENDENT_AMBULATORY_CARE_PROVIDER_SITE_OTHER): Payer: Medicare Other

## 2016-11-10 DIAGNOSIS — D649 Anemia, unspecified: Secondary | ICD-10-CM | POA: Diagnosis not present

## 2016-11-10 LAB — CBC WITH DIFFERENTIAL/PLATELET
BASOS ABS: 0 10*3/uL (ref 0.0–0.1)
Basophils Relative: 0.8 % (ref 0.0–3.0)
EOS ABS: 0.3 10*3/uL (ref 0.0–0.7)
Eosinophils Relative: 4.1 % (ref 0.0–5.0)
HEMATOCRIT: 33.5 % — AB (ref 36.0–46.0)
Hemoglobin: 10.9 g/dL — ABNORMAL LOW (ref 12.0–15.0)
LYMPHS PCT: 41.6 % (ref 12.0–46.0)
Lymphs Abs: 2.6 10*3/uL (ref 0.7–4.0)
MCHC: 32.5 g/dL (ref 30.0–36.0)
MCV: 98.2 fl (ref 78.0–100.0)
MONOS PCT: 11.6 % (ref 3.0–12.0)
Monocytes Absolute: 0.7 10*3/uL (ref 0.1–1.0)
NEUTROS PCT: 41.9 % — AB (ref 43.0–77.0)
Neutro Abs: 2.6 10*3/uL (ref 1.4–7.7)
Platelets: 292 10*3/uL (ref 150.0–400.0)
RBC: 3.41 Mil/uL — AB (ref 3.87–5.11)
RDW: 15.2 % (ref 11.5–15.5)
WBC: 6.2 10*3/uL (ref 4.0–10.5)

## 2016-11-11 ENCOUNTER — Other Ambulatory Visit: Payer: Self-pay

## 2016-11-11 DIAGNOSIS — D649 Anemia, unspecified: Secondary | ICD-10-CM

## 2016-11-18 DIAGNOSIS — L219 Seborrheic dermatitis, unspecified: Secondary | ICD-10-CM | POA: Diagnosis not present

## 2016-11-21 ENCOUNTER — Encounter: Payer: Self-pay | Admitting: Gastroenterology

## 2016-12-03 ENCOUNTER — Encounter: Payer: Self-pay | Admitting: Internal Medicine

## 2016-12-03 ENCOUNTER — Ambulatory Visit (INDEPENDENT_AMBULATORY_CARE_PROVIDER_SITE_OTHER): Payer: Medicare Other | Admitting: Internal Medicine

## 2016-12-03 ENCOUNTER — Ambulatory Visit (INDEPENDENT_AMBULATORY_CARE_PROVIDER_SITE_OTHER)
Admission: RE | Admit: 2016-12-03 | Discharge: 2016-12-03 | Disposition: A | Discharge status: Home / Self Care | Payer: Medicare Other | Source: Ambulatory Visit | Attending: Internal Medicine | Admitting: Internal Medicine

## 2016-12-03 ENCOUNTER — Telehealth: Payer: Self-pay

## 2016-12-03 VITALS — BP 144/90 | HR 97 | Temp 97.8°F | Ht 64.0 in | Wt 109.0 lb

## 2016-12-03 DIAGNOSIS — M79675 Pain in left toe(s): Secondary | ICD-10-CM

## 2016-12-03 DIAGNOSIS — S92512A Displaced fracture of proximal phalanx of left lesser toe(s), initial encounter for closed fracture: Secondary | ICD-10-CM | POA: Diagnosis not present

## 2016-12-03 NOTE — Assessment & Plan Note (Signed)
With several days worsening pain, swelling  - likely some type of fx of toe vs distal foot; for film, pain control, buddy tape to 3rd toe, consider podiatry if not improved

## 2016-12-03 NOTE — Telephone Encounter (Signed)
Pt has been informed and expressed understanding.  

## 2016-12-03 NOTE — Telephone Encounter (Signed)
-----   Message from Biagio Borg, MD sent at 12/03/2016 12:20 PM EDT ----- Left message on MyChart, pt to cont same tx except  The test results show that your current treatment is OK., as although the xray does show a fracture, it is of the toe and not the foot.  Toe fractures do not normally need specific treatment except for the Buddy Taping we mentioned at your visit.   Shirron to please inform pt, no change in tx otherwise needed

## 2016-12-03 NOTE — Progress Notes (Signed)
Subjective:    Patient ID: Lindsay Doyle, female    DOB: Jul 03, 1933, 83 y.o.   MRN: [id number]  HPI  Here after unfortuante accident where she stubbed her 4th toe left foot on furniture, hurt mildy at the time, but has actually become worse in last few days with worsening pain, swelling of the toe with the worst pain at the distal foot just adjacent to the proximal toe.  No fever, other trauma, or hx of recent gout.  Pt denies chest pain, increased sob or doe, wheezing, orthopnea, PND, increased LE swelling, palpitations, dizziness or syncope.   Pt denies polydipsia, polyuria Past Medical History:  Diagnosis Date  . ANEMIA-IRON DEFICIENCY    presumed GI bleed 08/2012- anticoag stopped  . Asthma    per pt, she does not have asthma  . Atrial fibrillation ([place])    a. Apixaban started 03/2012 - stopped after admx for GI bleed 6/14  . Blood transfusion    a. 05/2010: 3 transfusions for anemia/GIB.  [name] CAD (coronary artery disease)    a. LHC 1/14:  mLAD serial 30 and 60%, oRCA 30%, EF 60%, Afib,   . Cataract   . COLONIC POLYPS, HX OF 03/09/2007  . COPD 03/09/2007   ? pt is unsure  . GERD (gastroesophageal reflux disease)   . GI bleed    a. 05/2010: Hgb 7 at outside hospital s/p 3 u PRBC, EGD 05/2010 mild gastritis, small hiatal hernia, Schatzki ring, negative colonoscopy; capsule endoscopy 06/2010 - possible nonspecific inflammatory changes.;  b.  08/2012: 1/2 guiac +; required transfusion - Apixaban d/c'd  . Hx of echocardiogram    a. echo 2/11:  EF 55-60%, mild AI, mild RAE, mild to mod TR;   b.  a. Echo 2/14:  Mild focal basal and mild LVH, EF 60-65%, mild AI, mild RAE, trivia effusion  . HYPERGLYCEMIA 07/02/2007  . Hyperlipidemia   . Hypertension 11/12/2006  . LEUKOPENIA, MILD 05/30/2008  . Mitral valve prolapse    Remotely - last echo 2011 did not show this  . NSTEMI (non-ST elevated myocardial infarction) ([place]) 03/2012   a. Type II in setting of AF with RVR 03/2012  . OSTEOARTHRITIS 02/19/2009     Spinal  . OSTEOPOROSIS 11/12/2006  . Positive H. pylori test 10/1996  . PSVT    a. Remotely, in setting of anemia.  [name] Spinal stenosis of lumbar region 07/16/2012  . Transfusion history    7/15    Past Surgical History:  Procedure Laterality Date  . ABDOMINAL HYSTERECTOMY    . CARDIAC CATHETERIZATION  1992   S/P in 1992 at [facility] in [place]. This was negative for any coronary artery disease  . carotid duplex  08/29/2003  . CATARACT EXTRACTION    . ceasarean    . ENTEROSCOPY N/A 10/10/2013   Procedure: ENTEROSCOPY;  Surgeon: Inda Castle, MD;  Location: [facility] ENDOSCOPY;  Service: Endoscopy;  Laterality: N/A;  . ENTEROSCOPY N/A 12/28/2014   Procedure: ENTEROSCOPY;  Surgeon: Inda Castle, MD;  Location: [facility] ENDOSCOPY;  Service: Endoscopy;  Laterality: N/A;  . ENTEROSCOPY N/A 01/20/2016   Procedure: ENTEROSCOPY;  Surgeon: Manus Gunning, MD;  Location: [facility] ENDOSCOPY;  Service: Gastroenterology;  Laterality: N/A;  . HOT HEMOSTASIS N/A 12/28/2014   Procedure: HOT HEMOSTASIS (ARGON PLASMA COAGULATION/BICAP);  Surgeon: Inda Castle, MD;  Location: [facility] ENDOSCOPY;  Service: Endoscopy;  Laterality: N/A;  . LEFT HEART CATHETERIZATION WITH CORONARY ANGIOGRAM N/A 04/23/2012  Procedure: LEFT HEART CATHETERIZATION WITH CORONARY ANGIOGRAM;  Surgeon: Larey Dresser, MD;  Location: [facility] CATH LAB;  Service: Cardiovascular;  Laterality: N/A;  . TONSILLECTOMY    . TUBAL LIGATION      reports that she has never smoked. She has never used smokeless tobacco. She reports that she does not drink alcohol or use drugs. family history includes Colon cancer in her brother; Prostate cancer in her brother; Sarcoidosis in her other and sister; Stomach cancer in her mother. Allergies  Allergen Reactions  . Doxycycline Diarrhea    Possible diarrhea  . Biaxin [Clarithromycin] Other (See Comments)    diarrhea  . Metronidazole Other (See Comments)    Pt had difficulty swallowing  this and says it was "horrible" to take. No allergic reaction.   Current Outpatient Prescriptions on File Prior to Visit  Medication Sig Dispense Refill  . acetaminophen (TYLENOL) 500 MG tablet Take 2 tablets (1,000 mg total) by mouth 2 (two) times daily. 60 tablet 11  . aspirin 325 MG tablet Take 325 mg by mouth daily.    [name] atorvastatin (LIPITOR) 20 MG tablet TAKE 1 TABLET (20 MG TOTAL) BY MOUTH DAILY. 90 tablet 3  . ferrous sulfate 325 (65 FE) MG tablet Take 325 mg by mouth 3 (three) times daily with meals.    . metoprolol succinate (TOPROL-XL) 25 MG 24 hr tablet Take 1 tablet (25 mg total) by mouth daily. 90 tablet 3  . Multiple Vitamin (MULTIVITAMIN) tablet Take 1 tablet by mouth daily.    . nitroGLYCERIN (NITROSTAT) 0.4 MG SL tablet Place 1 tablet (0.4 mg total) under the tongue every 5 (five) minutes as needed for chest pain (maximum of 3 tablets). 25 tablet 2   No current facility-administered medications on file prior to visit.    Review of Systems All otherwise neg per pt    Objective:   Physical Exam BP (!) 144/90   Pulse 97   Temp 97.8 F (36.6 C) (Oral)   Ht 5\' 4"  (1.626 m)   Wt 109 lb (49.4 kg)   SpO2 97%   BMI 18.71 kg/m  VS noted,  Constitutional: Pt appears in NAD HENT: Head: NCAT.  Right Ear: External ear normal.  Left Ear: External ear normal.  Eyes: . Pupils are equal, round, and reactive to light. Conjunctivae and EOM are normal Nose: without d/c or deformity Neck: Neck supple. Gross normal ROM Cardiovascular: Normal rate and regular rhythm.   Pulmonary/Chest: Effort normal and breath sounds without rales or wheezing.  Left foot with 2+ tender swelling bruising 4th toe and distal foot adjacent to proximal 4th toe Neurological: Pt is alert. At baseline orientation, motor grossly intact Skin: Skin is warm. No rashes, other new lesions, no LE edema Psychiatric: Pt behavior is normal without agitation         Assessment & Plan:

## 2016-12-03 NOTE — Patient Instructions (Addendum)
Please continue all other medications as before, and refills have been done if requested.  Please have the pharmacy call with any other refills you may need.  Please keep your appointments with your specialists as you may have planned  Please go to the XRAY Department in the Basement (go straight as you get off the elevator) for the x-ray testing  You will be contacted by phone if any changes need to be made immediately.  Otherwise, you will receive a letter about your results with an explanation, but please check with MyChart first.  Please remember to sign up for MyChart if you have not done so, as this will be important to you in the future with finding out test results, communicating by private email, and scheduling acute appointments online when needed.  

## 2016-12-24 ENCOUNTER — Encounter: Payer: Self-pay | Admitting: Cardiovascular Disease

## 2017-01-05 ENCOUNTER — Ambulatory Visit (INDEPENDENT_AMBULATORY_CARE_PROVIDER_SITE_OTHER): Payer: Medicare Other | Admitting: Cardiovascular Disease

## 2017-01-05 ENCOUNTER — Encounter: Payer: Self-pay | Admitting: Cardiovascular Disease

## 2017-01-05 VITALS — BP 140/90 | HR 84 | Ht 64.0 in | Wt 109.4 lb

## 2017-01-05 DIAGNOSIS — I480 Paroxysmal atrial fibrillation: Secondary | ICD-10-CM

## 2017-01-05 NOTE — Progress Notes (Signed)
Cardiology Office Note Date:  01/05/2017   ID:  Ozella Almond B. Nadezhda, Pollitt 11-Jun-1933, MRN [id number]  PCP:  Biagio Borg, MD  Cardiologist:  Sherren Mocha, MD    Chief Complaint  Patient presents with  . Follow-up    6 month/ Paroxysmal atrial fibrillation     History of Present Illness: Lindsay Doyle is a 83 y.o. female who presents for follow-up of paroxysmal atrial fibrillation.  She was initially diagnosed with atrial fibrillation in 2014 when she presented with AF with RVR and NSTEMI. She underwent left heart catheterization showing nonobstructive CAD at that time.  The patient is here with her husband today. She reports one episode of tachy-palpitations in the past 6 months. This episode lasted approximately 15 minutes and resolved spontaneously. Her blood counts are followed monthly and she has not required IV iron or PRBC transfusion since her last visit here. She's had no chest pain, chest pressure, shortness of breath, or leg swelling.   Pt notes BP has been elevated but attributes this to the stress of moving from her home into an apartment. States she's getting used to the transition but things have been difficult for her.   Past Medical History:  Diagnosis Date  . ANEMIA-IRON DEFICIENCY    presumed GI bleed 08/2012- anticoag stopped  . Asthma    per pt, she does not have asthma  . Atrial fibrillation ([place])    a. Apixaban started 03/2012 - stopped after admx for GI bleed 6/14  . Blood transfusion    a. 05/2010: 3 transfusions for anemia/GIB.  [name] CAD (coronary artery disease)    a. LHC 1/14:  mLAD serial 30 and 60%, oRCA 30%, EF 60%, Afib,   . Cataract   . COLONIC POLYPS, HX OF 03/09/2007  . COPD 03/09/2007   ? pt is unsure  . GERD (gastroesophageal reflux disease)   . GI bleed    a. 05/2010: Hgb 7 at outside hospital s/p 3 u PRBC, EGD 05/2010 mild gastritis, small hiatal hernia, Schatzki ring, negative colonoscopy; capsule endoscopy 06/2010 - possible nonspecific  inflammatory changes.;  b.  08/2012: 1/2 guiac +; required transfusion - Apixaban d/c'd  . Hx of echocardiogram    a. echo 2/11:  EF 55-60%, mild AI, mild RAE, mild to mod TR;   b.  a. Echo 2/14:  Mild focal basal and mild LVH, EF 60-65%, mild AI, mild RAE, trivia effusion  . HYPERGLYCEMIA 07/02/2007  . Hyperlipidemia   . Hypertension 11/12/2006  . LEUKOPENIA, MILD 05/30/2008  . Mitral valve prolapse    Remotely - last echo 2011 did not show this  . NSTEMI (non-ST elevated myocardial infarction) ([place]) 03/2012   a. Type II in setting of AF with RVR 03/2012  . OSTEOARTHRITIS 02/19/2009   Spinal  . OSTEOPOROSIS 11/12/2006  . Positive H. pylori test 10/1996  . PSVT    a. Remotely, in setting of anemia.  [name] Spinal stenosis of lumbar region 07/16/2012  . Transfusion history    7/15     Past Surgical History:  Procedure Laterality Date  . ABDOMINAL HYSTERECTOMY    . CARDIAC CATHETERIZATION  1992   S/P in 1992 at [facility] in [place]. This was negative for any coronary artery disease  . carotid duplex  08/29/2003  . CATARACT EXTRACTION    . ceasarean    . ENTEROSCOPY N/A 10/10/2013   Procedure: ENTEROSCOPY;  Surgeon: Inda Castle, MD;  Location: [facility] ENDOSCOPY;  Service: Endoscopy;  Laterality: N/A;  . ENTEROSCOPY N/A 12/28/2014   Procedure: ENTEROSCOPY;  Surgeon: Inda Castle, MD;  Location: [facility] ENDOSCOPY;  Service: Endoscopy;  Laterality: N/A;  . ENTEROSCOPY N/A 01/20/2016   Procedure: ENTEROSCOPY;  Surgeon: Manus Gunning, MD;  Location: [facility] ENDOSCOPY;  Service: Gastroenterology;  Laterality: N/A;  . HOT HEMOSTASIS N/A 12/28/2014   Procedure: HOT HEMOSTASIS (ARGON PLASMA COAGULATION/BICAP);  Surgeon: Inda Castle, MD;  Location: [facility] ENDOSCOPY;  Service: Endoscopy;  Laterality: N/A;  . LEFT HEART CATHETERIZATION WITH CORONARY ANGIOGRAM N/A 04/23/2012   Procedure: LEFT HEART CATHETERIZATION WITH CORONARY ANGIOGRAM;  Surgeon: Larey Dresser, MD;  Location: [facility] CATH  LAB;  Service: Cardiovascular;  Laterality: N/A;  . TONSILLECTOMY    . TUBAL LIGATION      Current Outpatient Prescriptions  Medication Sig Dispense Refill  . acetaminophen (TYLENOL) 500 MG tablet Take 2 tablets (1,000 mg total) by mouth 2 (two) times daily. 60 tablet 11  . aspirin 325 MG tablet Take 325 mg by mouth daily.    [name] atorvastatin (LIPITOR) 20 MG tablet TAKE 1 TABLET (20 MG TOTAL) BY MOUTH DAILY. 90 tablet 3  . ferrous sulfate 325 (65 FE) MG tablet Take 325 mg by mouth 3 (three) times daily with meals.    . metoprolol succinate (TOPROL-XL) 25 MG 24 hr tablet Take 1 tablet (25 mg total) by mouth daily. 90 tablet 3  . Multiple Vitamin (MULTIVITAMIN) tablet Take 1 tablet by mouth daily.    . nitroGLYCERIN (NITROSTAT) 0.4 MG SL tablet Place 1 tablet (0.4 mg total) under the tongue every 5 (five) minutes as needed for chest pain (maximum of 3 tablets). 25 tablet 2   No current facility-administered medications for this visit.     Allergies:   Doxycycline; Biaxin [clarithromycin]; and Metronidazole   Social History:  The patient  reports that she has never smoked. She has never used smokeless tobacco. She reports that she does not drink alcohol or use drugs.   Family History:  The patient's  family history includes Colon cancer in her brother; Prostate cancer in her brother; Sarcoidosis in her other and sister; Stomach cancer in her mother.   ROS:  Please see the history of present illness.  Otherwise, review of systems is positive for diarrhea.  All other systems are reviewed and negative.   PHYSICAL EXAM: VS:  BP 140/90   Pulse 84   Ht 5\' 4"  (1.626 m)   Wt 109 lb 6.4 oz (49.6 kg)   BMI 18.78 kg/m  , BMI Body mass index is 18.78 kg/m. GEN: Thin, pleasant elderly woman in no acute distress  HEENT: normal  Neck: no JVD, no masses. No carotid bruits Cardiac: RRR without murmur or gallop                Respiratory:  clear to auscultation bilaterally, normal work of  breathing GI: soft, nontender, nondistended, + BS MS: no deformity or atrophy  Ext: no pretibial edema, pedal pulses 2+= bilaterally Skin: warm and dry, no rash Neuro:  Strength and sensation are intact Psych: euthymic mood, full affect  EKG:  EKG is ordered today. The ekg ordered today shows NSR 84 bpm, within normal limits  Recent Labs: 09/25/2016: ALT 13; BUN 14; Creatinine, Ser 1.06; Potassium 3.9; Sodium 132; TSH 1.24 11/10/2016: Hemoglobin 10.9; Platelets 292.0   Lipid Panel     Component Value Date/Time   CHOL 172 09/25/2016 1534   TRIG 120.0 09/25/2016 1534  HDL 75.40 09/25/2016 1534   CHOLHDL 2 09/25/2016 1534   VLDL 24.0 09/25/2016 1534   LDLCALC 73 09/25/2016 1534   LDLDIRECT 223.7 01/08/2012 0858      Wt Readings from Last 3 Encounters:  01/05/17 109 lb 6.4 oz (49.6 kg)  12/03/16 109 lb (49.4 kg)  09/24/16 115 lb (52.2 kg)     ASSESSMENT AND PLAN: 1.  Paroxysmal atrial fibrillation: maintaining sinus rhythm with rare palpitations. Unable to tolerate anticoagulant drugs. Discussed [facility] L[facility] with her and she is not interested in pursuing this. Advised she can stop taking aspirin as current guidelines suggest no benefit in the atrial fib population and she has had a lot of problems with chronic GI blood loss. Continue metoprolol succinate.   2. Hyperlipidemia: treated with atorvastatin.   Current medicines are reviewed with the patient today.  The patient does not have concerns regarding medicines.  Labs/ tests ordered today include:  No orders of the defined types were placed in this encounter.   Disposition:   FU one year  Signed, Sherren Mocha, MD  01/05/2017 9:07 AM    [facility] [place], [place], [place]  [address] Phone: [phone]; Fax: [phone]

## 2017-01-05 NOTE — Patient Instructions (Signed)
Medication Instructions:  1) STOP ASPIRIN  Labwork: None  Testing/Procedures: None  Follow-Up: Your provider wants you to follow-up in: 6 months with Dr. Burt Knack. You will receive a reminder letter in the mail two months in advance. If you don't receive a letter, please call our office to schedule the follow-up appointment.    Any Other Special Instructions Will Be Listed Below (If Applicable).     If you need a refill on your cardiac medications before your next appointment, please call your pharmacy.

## 2017-01-06 ENCOUNTER — Other Ambulatory Visit: Payer: Self-pay | Admitting: Internal Medicine

## 2017-01-08 ENCOUNTER — Ambulatory Visit: Payer: Medicare Other

## 2017-01-14 ENCOUNTER — Encounter: Payer: Self-pay | Admitting: Gastroenterology

## 2017-01-14 ENCOUNTER — Ambulatory Visit (INDEPENDENT_AMBULATORY_CARE_PROVIDER_SITE_OTHER): Payer: Medicare Other | Admitting: Gastroenterology

## 2017-01-14 ENCOUNTER — Other Ambulatory Visit (INDEPENDENT_AMBULATORY_CARE_PROVIDER_SITE_OTHER): Payer: Medicare Other

## 2017-01-14 ENCOUNTER — Telehealth: Payer: Self-pay | Admitting: Gastroenterology

## 2017-01-14 VITALS — BP 120/78 | HR 94 | Ht 64.0 in | Wt 110.0 lb

## 2017-01-14 DIAGNOSIS — D649 Anemia, unspecified: Secondary | ICD-10-CM

## 2017-01-14 DIAGNOSIS — R194 Change in bowel habit: Secondary | ICD-10-CM | POA: Diagnosis not present

## 2017-01-14 LAB — CBC WITH DIFFERENTIAL/PLATELET
BASOS PCT: 0.6 % (ref 0.0–3.0)
Basophils Absolute: 0 10*3/uL (ref 0.0–0.1)
EOS ABS: 0.2 10*3/uL (ref 0.0–0.7)
Eosinophils Relative: 2.7 % (ref 0.0–5.0)
HEMATOCRIT: 35.5 % — AB (ref 36.0–46.0)
HEMOGLOBIN: 11.5 g/dL — AB (ref 12.0–15.0)
LYMPHS PCT: 39.2 % (ref 12.0–46.0)
Lymphs Abs: 2.3 10*3/uL (ref 0.7–4.0)
MCHC: 32.5 g/dL (ref 30.0–36.0)
MCV: 100.1 fl — ABNORMAL HIGH (ref 78.0–100.0)
Monocytes Absolute: 0.8 10*3/uL (ref 0.1–1.0)
Monocytes Relative: 13.8 % — ABNORMAL HIGH (ref 3.0–12.0)
Neutro Abs: 2.6 10*3/uL (ref 1.4–7.7)
Neutrophils Relative %: 43.7 % (ref 43.0–77.0)
Platelets: 270 10*3/uL (ref 150.0–400.0)
RBC: 3.55 Mil/uL — ABNORMAL LOW (ref 3.87–5.11)
RDW: 15.2 % (ref 11.5–15.5)
WBC: 6 10*3/uL (ref 4.0–10.5)

## 2017-01-14 NOTE — Patient Instructions (Signed)
Your physician has requested that you go to the basement for the following lab work before leaving today: CBC  Please start taking Citrucel everyday.  If you are age 81 or older, your body mass index should be between 23-30. Your Body mass index is 18.88 kg/m. If this is out of the aforementioned range listed, please consider follow up with your Primary Care Provider.  If you are age 65 or younger, your body mass index should be between 19-25. Your Body mass index is 18.88 kg/m. If this is out of the aformentioned range listed, please consider follow up with your Primary Care Provider.   Thank you.

## 2017-01-14 NOTE — Telephone Encounter (Signed)
When would you like Lindsay Doyle to come back for a follow up and when would you like for her to have labs done again? thx

## 2017-01-14 NOTE — Progress Notes (Signed)
HPI :  83 y/o female here for a follow up visit for anemia. I met her in October 2017 during an inpatient stay for dark stools and worsening anemia. Hgb was noted to be 6 at time of admission. She underwent a push enteroscopy during her admission with findings of active bleeding in the distal duodenum versus proximal jejunum, thought to be most likely an AVM. It was treated with APC, epinephrine, and hemostasis clip with eventual hemostasis. She has had a prior history of a colonoscopy in March 2012 done for iron deficiency anemia which was a normal exam, and EGD in March 2012 showed gastritis. Capsule endoscopy in 2012 showed some patchy nonspecific erythema. She has also had a prior small bowel enteroscopy in 2016 with Dr. Deatra Ina in which a gastric AVM was noted and treated with APC  Since her last visit she denies any blood in her stools although her stools are chronically dark. She has no abdominal pains. Her  Hgb of 10.9 on August 13th (previously 11s). She denies any NSAID use, in fact recently stopped her aspirin. No new medications. She has endorsed about 2 BMs per day on average, usually in the morning, with some urgency. Stools are a bit looser but not liquid. She previously had constipation on iron. She has changed her diet a bit without any change in symptoms.    Past Medical History:  Diagnosis Date  . ANEMIA-IRON DEFICIENCY    presumed GI bleed 08/2012- anticoag stopped  . Asthma    per pt, she does not have asthma  . Atrial fibrillation ([place])    a. Apixaban started 03/2012 - stopped after admx for GI bleed 6/14  . Blood transfusion    a. 05/2010: 3 transfusions for anemia/GIB.  [name] CAD (coronary artery disease)    a. LHC 1/14:  mLAD serial 30 and 60%, oRCA 30%, EF 60%, Afib,   . Cataract   . COLONIC POLYPS, HX OF 03/09/2007  . COPD 03/09/2007   ? pt is unsure  . GERD (gastroesophageal reflux disease)   . GI bleed    thought to be due to AVMs  . Hx of echocardiogram    a. echo  2/11:  EF 55-60%, mild AI, mild RAE, mild to mod TR;   b.  a. Echo 2/14:  Mild focal basal and mild LVH, EF 60-65%, mild AI, mild RAE, trivia effusion  . HYPERGLYCEMIA 07/02/2007  . Hyperlipidemia   . Hypertension 11/12/2006  . LEUKOPENIA, MILD 05/30/2008  . Mitral valve prolapse    Remotely - last echo 2011 did not show this  . NSTEMI (non-ST elevated myocardial infarction) ([place]) 03/2012   a. Type II in setting of AF with RVR 03/2012  . OSTEOARTHRITIS 02/19/2009   Spinal  . OSTEOPOROSIS 11/12/2006  . Positive H. pylori test 10/1996  . PSVT    a. Remotely, in setting of anemia.  [name] Spinal stenosis of lumbar region 07/16/2012  . Transfusion history    7/15      Past Surgical History:  Procedure Laterality Date  . ABDOMINAL HYSTERECTOMY    . CARDIAC CATHETERIZATION  1992   S/P in 1992 at [facility] in [place]. This was negative for any coronary artery disease  . carotid duplex  08/29/2003  . CATARACT EXTRACTION    . ceasarean    . ENTEROSCOPY N/A 10/10/2013   Procedure: ENTEROSCOPY;  Surgeon: Inda Castle, MD;  Location: [facility] ENDOSCOPY;  Service: Endoscopy;  Laterality: N/A;  .  ENTEROSCOPY N/A 12/28/2014   Procedure: ENTEROSCOPY;  Surgeon: Inda Castle, MD;  Location: [facility] ENDOSCOPY;  Service: Endoscopy;  Laterality: N/A;  . ENTEROSCOPY N/A 01/20/2016   Procedure: ENTEROSCOPY;  Surgeon: Manus Gunning, MD;  Location: [facility] ENDOSCOPY;  Service: Gastroenterology;  Laterality: N/A;  . HOT HEMOSTASIS N/A 12/28/2014   Procedure: HOT HEMOSTASIS (ARGON PLASMA COAGULATION/BICAP);  Surgeon: Inda Castle, MD;  Location: [facility] ENDOSCOPY;  Service: Endoscopy;  Laterality: N/A;  . LEFT HEART CATHETERIZATION WITH CORONARY ANGIOGRAM N/A 04/23/2012   Procedure: LEFT HEART CATHETERIZATION WITH CORONARY ANGIOGRAM;  Surgeon: Larey Dresser, MD;  Location: [facility] CATH LAB;  Service: Cardiovascular;  Laterality: N/A;  . TONSILLECTOMY    . TUBAL LIGATION     Family History  Problem  Relation Age of Onset  . Prostate cancer Brother   . Stomach cancer Mother   . Sarcoidosis Sister   . Colon cancer Brother   . Sarcoidosis Other   . CAD Neg Hx    Social History  Substance Use Topics  . Smoking status: Never Smoker  . Smokeless tobacco: Never Used  . Alcohol use No   Current Outpatient Prescriptions  Medication Sig Dispense Refill  . acetaminophen (TYLENOL) 500 MG tablet Take 2 tablets (1,000 mg total) by mouth 2 (two) times daily. 60 tablet 11  . atorvastatin (LIPITOR) 20 MG tablet TAKE 1 TABLET (20 MG TOTAL) BY MOUTH DAILY. 90 tablet 1  . ferrous sulfate 325 (65 FE) MG tablet Take 325 mg by mouth 3 (three) times daily with meals.    . metoprolol succinate (TOPROL-XL) 25 MG 24 hr tablet Take 1 tablet (25 mg total) by mouth daily. 90 tablet 3  . Multiple Vitamin (MULTIVITAMIN) tablet Take 1 tablet by mouth daily.    . nitroGLYCERIN (NITROSTAT) 0.4 MG SL tablet Place 1 tablet (0.4 mg total) under the tongue every 5 (five) minutes as needed for chest pain (maximum of 3 tablets). 25 tablet 2   No current facility-administered medications for this visit.    Allergies  Allergen Reactions  . Doxycycline Diarrhea    Possible diarrhea  . Biaxin [Clarithromycin] Other (See Comments)    diarrhea  . Metronidazole Other (See Comments)    Pt had difficulty swallowing this and says it was "horrible" to take. No allergic reaction.     Review of Systems: All systems reviewed and negative except where noted in HPI.   CBC Latest Ref Rng & Units 11/10/2016 09/25/2016 08/05/2016  WBC 4.0 - 10.5 K/uL 6.2 5.8 6.2  Hemoglobin 12.0 - 15.0 g/dL 10.9(L) 11.7(L) 11.8(L)  Hematocrit 36.0 - 46.0 % 33.5(L) 35.0(L) 35.6(L)  Platelets 150.0 - 400.0 K/uL 292.0 261.0 297.0     Physical Exam: BP 120/78   Pulse 94   Ht _0  (1.626 m)   Wt 110 lb (49.9 kg)   BMI 18.88 kg/m  Constitutional: Pleasant female in no acute distress. HEENT: Normocephalic and atraumatic. Conjunctivae are  normal. No scleral icterus. Neck supple.  Cardiovascular: Normal rate, regular rhythm.  Pulmonary/chest: Effort normal and breath sounds normal. No wheezing, rales or rhonchi. Abdominal: Soft, nondistended, nontender.  There are no masses palpable. No hepatomegaly. Extremities: no edema Lymphadenopathy: No cervical adenopathy noted. Neurological: Alert and oriented to person place and time. Skin: Skin is warm and dry. No rashes noted. Psychiatric: Normal mood and affect. Behavior is normal.   ASSESSMENT AND PLAN: 83 y/o female here for reassessment of the following issues:  Anemia / history of GI bleeding -  as above, GI bleed last year due to small bowel AVM. She has done well since that time without any recurrent bleeding. On oral iron, stools chronically dark, although Hgb had drifted down a point as of August. She has had an extensive prior evaluation for this issue, suspected due to small bowel AVMs. Will check CBC today to ensure stable, she should continue iron supplementation.   Change in bowel habits - previously constipated on iron, now with some looser stools but not overt diarrhea. Recommend Citrucel daily to help regularize bowel habits. If this does not provide benefit she can contact me for further recommendations. Hesitant to use immodium unless she has diarrhea, will likely constipate her.  [place] Cellar, MD [facility] Pager [phone]

## 2017-01-15 NOTE — Telephone Encounter (Signed)
Repeat CBC in 6 months - details in result note sent to you from her blood work. Thanks

## 2017-01-15 NOTE — Telephone Encounter (Signed)
Spoke to pt.  Relayed recommendations. She expressed understanding.

## 2017-01-16 ENCOUNTER — Ambulatory Visit (INDEPENDENT_AMBULATORY_CARE_PROVIDER_SITE_OTHER): Payer: Medicare Other

## 2017-01-16 DIAGNOSIS — Z23 Encounter for immunization: Secondary | ICD-10-CM | POA: Diagnosis not present

## 2017-03-02 ENCOUNTER — Other Ambulatory Visit: Payer: Self-pay | Admitting: Internal Medicine

## 2017-04-01 ENCOUNTER — Ambulatory Visit (INDEPENDENT_AMBULATORY_CARE_PROVIDER_SITE_OTHER): Payer: Medicare Other | Admitting: Internal Medicine

## 2017-04-01 ENCOUNTER — Encounter: Payer: Self-pay | Admitting: Internal Medicine

## 2017-04-01 ENCOUNTER — Other Ambulatory Visit (INDEPENDENT_AMBULATORY_CARE_PROVIDER_SITE_OTHER): Payer: Medicare Other

## 2017-04-01 VITALS — BP 110/72 | HR 99 | Temp 97.8°F | Ht 64.0 in | Wt 107.0 lb

## 2017-04-01 DIAGNOSIS — I10 Essential (primary) hypertension: Secondary | ICD-10-CM

## 2017-04-01 DIAGNOSIS — D500 Iron deficiency anemia secondary to blood loss (chronic): Secondary | ICD-10-CM

## 2017-04-01 DIAGNOSIS — M19042 Primary osteoarthritis, left hand: Secondary | ICD-10-CM

## 2017-04-01 DIAGNOSIS — R221 Localized swelling, mass and lump, neck: Secondary | ICD-10-CM

## 2017-04-01 DIAGNOSIS — E785 Hyperlipidemia, unspecified: Secondary | ICD-10-CM

## 2017-04-01 DIAGNOSIS — M19041 Primary osteoarthritis, right hand: Secondary | ICD-10-CM | POA: Diagnosis not present

## 2017-04-01 DIAGNOSIS — R739 Hyperglycemia, unspecified: Secondary | ICD-10-CM

## 2017-04-01 DIAGNOSIS — N184 Chronic kidney disease, stage 4 (severe): Secondary | ICD-10-CM | POA: Diagnosis not present

## 2017-04-01 LAB — CBC WITH DIFFERENTIAL/PLATELET
BASOS ABS: 0.1 10*3/uL (ref 0.0–0.1)
Basophils Relative: 1.1 % (ref 0.0–3.0)
EOS ABS: 0.2 10*3/uL (ref 0.0–0.7)
Eosinophils Relative: 3.4 % (ref 0.0–5.0)
HCT: 36.4 % (ref 36.0–46.0)
Hemoglobin: 12 g/dL (ref 12.0–15.0)
LYMPHS ABS: 1.9 10*3/uL (ref 0.7–4.0)
Lymphocytes Relative: 30.5 % (ref 12.0–46.0)
MCHC: 32.9 g/dL (ref 30.0–36.0)
MCV: 101.2 fl — ABNORMAL HIGH (ref 78.0–100.0)
MONO ABS: 0.9 10*3/uL (ref 0.1–1.0)
Monocytes Relative: 13.6 % — ABNORMAL HIGH (ref 3.0–12.0)
NEUTROS PCT: 51.4 % (ref 43.0–77.0)
Neutro Abs: 3.3 10*3/uL (ref 1.4–7.7)
Platelets: 265 10*3/uL (ref 150.0–400.0)
RBC: 3.6 Mil/uL — ABNORMAL LOW (ref 3.87–5.11)
RDW: 14.3 % (ref 11.5–15.5)
WBC: 6.3 10*3/uL (ref 4.0–10.5)

## 2017-04-01 LAB — LIPID PANEL
Cholesterol: 166 mg/dL (ref 0–200)
HDL: 62 mg/dL (ref >39.00)
LDL Cholesterol: 83 mg/dL (ref 0–99)
NONHDL: 104.38
TRIGLYCERIDES: 107 mg/dL (ref 0.0–149.0)
Total CHOL/HDL Ratio: 3
VLDL: 21.4 mg/dL (ref 0.0–40.0)

## 2017-04-01 LAB — URINALYSIS, ROUTINE W REFLEX MICROSCOPIC
Bilirubin Urine: NEGATIVE
KETONES UR: NEGATIVE
LEUKOCYTES UA: NEGATIVE
NITRITE: NEGATIVE
Total Protein, Urine: NEGATIVE
URINE GLUCOSE: NEGATIVE
UROBILINOGEN UA: 0.2 (ref 0.0–1.0)
WBC, UA: NONE SEEN (ref 0–?)
pH: 6.5 (ref 5.0–8.0)

## 2017-04-01 LAB — BASIC METABOLIC PANEL
BUN: 15 mg/dL (ref 6–23)
CALCIUM: 9.8 mg/dL (ref 8.4–10.5)
CO2: 27 mEq/L (ref 19–32)
Chloride: 103 mEq/L (ref 96–112)
Creatinine, Ser: 1.01 mg/dL (ref 0.40–1.20)
GFR: 67.53 mL/min (ref >60.00)
Glucose, Bld: 63 mg/dL — ABNORMAL LOW (ref 70–99)
Potassium: 5.5 mEq/L — ABNORMAL HIGH (ref 3.5–5.1)
SODIUM: 140 meq/L (ref 135–145)

## 2017-04-01 LAB — HEMOGLOBIN A1C: Hgb A1c MFr Bld: 6.4 % (ref 4.6–6.5)

## 2017-04-01 MED ORDER — DICLOFENAC SODIUM 1 % TD GEL: 4.0000 g | Freq: Four times a day (QID) | TRANSDERMAL | 3 refills | Status: DC | PRN

## 2017-04-01 NOTE — Assessment & Plan Note (Addendum)
?   Clinical significance left and right; for ENT referral for re evaluation  Note:  Total time for pt hx, exam, review of record with pt in the room, determination of diagnoses and plan for further eval and tx is > 40 min, with over 50% spent in coordination and counseling of patient including the differential dx, tx, further evaluation and other management of neck mass x 2, DJD of hands and feet, hyperglycemia, HLD, HTN, and iron def anemia

## 2017-04-01 NOTE — Assessment & Plan Note (Signed)
Also with bilat knee OA, ok for note to Apartment Admin to allow hand rails x 2 about the garden tub to reduce fall risk

## 2017-04-01 NOTE — Patient Instructions (Addendum)
Please take all new medication as prescribed - the arthritis topical gel for pain  You will be contacted regarding the referral for: Dr Constance Holster  Please continue all other medications as before, and refills have been done if requested.  Please have the pharmacy call with any other refills you may need.  Please continue your efforts at being more active, low cholesterol diet.  Please keep your appointments with your specialists as you may have planned  Please return in 6 months, or sooner if needed

## 2017-04-01 NOTE — Assessment & Plan Note (Signed)
stable overall by history and exam, recent data reviewed with pt, and pt to continue medical treatment as before,  to f/u any worsening symptoms or concerns, for f/u labs 

## 2017-04-01 NOTE — Assessment & Plan Note (Signed)
Asympt, for f/u cbc today,  to f/u any worsening symptoms or concerns

## 2017-04-01 NOTE — Assessment & Plan Note (Signed)
stable overall by history and exam, recent data reviewed with pt, and pt to continue medical treatment as before,  to f/u any worsening symptoms or concerns Lab Results  Component Value Date   HGBA1C 6.2 09/25/2016

## 2017-04-01 NOTE — Progress Notes (Signed)
Subjective:    Patient ID: Lindsay Doyle, female    DOB: [birth year], 84 y.o.   MRN: [id number]  HPI  Here to f/u; overall doing ok,  Pt denies chest pain, increasing sob or doe, wheezing, orthopnea, PND, increased LE swelling, palpitations, dizziness or syncope.  Pt denies new neurological symptoms such as new headache, or facial or extremity weakness or numbness.  Pt denies polydipsia, polyuria, or low sugar episode.  Pt states overall good compliance with meds, mostly trying to follow appropriate diet, with wt overall stable,  but little exercise however, mostly due to pain to hands and knees, In fact needs hand rails for bath at home due to arthritis in hands and risk of falling in tub.  Most recent hgb stable sept 2018, no overt bleeding or bruising per pt.  Was seen by ENT Dr Simeon Craft early 2017 for neck lump, apparently neck u/s ordered, but results not on chart, pt not sure done, and Dr Simeon Craft now left the practice.  Has 2 lumps now to left and right sides, not sure if getting worse.  Past Medical History:  Diagnosis Date  . ANEMIA-IRON DEFICIENCY    presumed GI bleed 08/2012- anticoag stopped  . Asthma    per pt, she does not have asthma  . Atrial fibrillation ([place])    a. Apixaban started 03/2012 - stopped after admx for GI bleed 6/14  . Blood transfusion    a. 05/2010: 3 transfusions for anemia/GIB.  [name] CAD (coronary artery disease)    a. LHC 1/14:  mLAD serial 30 and 60%, oRCA 30%, EF 60%, Afib,   . Cataract   . COLONIC POLYPS, HX OF 03/09/2007  . COPD 03/09/2007   ? pt is unsure  . GERD (gastroesophageal reflux disease)   . GI bleed    thought to be due to AVMs  . Hx of echocardiogram    a. echo 2/11:  EF 55-60%, mild AI, mild RAE, mild to mod TR;   b.  a. Echo 2/14:  Mild focal basal and mild LVH, EF 60-65%, mild AI, mild RAE, trivia effusion  . HYPERGLYCEMIA 07/02/2007  . Hyperlipidemia   . Hypertension 11/12/2006  . LEUKOPENIA, MILD 05/30/2008  . Mitral valve prolapse    Remotely -  last echo 2011 did not show this  . NSTEMI (non-ST elevated myocardial infarction) ([place]) 03/2012   a. Type II in setting of AF with RVR 03/2012  . OSTEOARTHRITIS 02/19/2009   Spinal  . OSTEOPOROSIS 11/12/2006  . Positive H. pylori test 10/1996  . PSVT    a. Remotely, in setting of anemia.  [name] Spinal stenosis of lumbar region 07/16/2012  . Transfusion history    7/15    Past Surgical History:  Procedure Laterality Date  . ABDOMINAL HYSTERECTOMY    . CARDIAC CATHETERIZATION  1992   S/P in 1992 at [facility] in [place]. This was negative for any coronary artery disease  . carotid duplex  08/29/2003  . CATARACT EXTRACTION    . ceasarean    . ENTEROSCOPY N/A 10/10/2013   Procedure: ENTEROSCOPY;  Surgeon: Inda Castle, MD;  Location: [facility] ENDOSCOPY;  Service: Endoscopy;  Laterality: N/A;  . ENTEROSCOPY N/A 12/28/2014   Procedure: ENTEROSCOPY;  Surgeon: Inda Castle, MD;  Location: [facility] ENDOSCOPY;  Service: Endoscopy;  Laterality: N/A;  . ENTEROSCOPY N/A 01/20/2016   Procedure: ENTEROSCOPY;  Surgeon: Manus Gunning, MD;  Location: [facility] ENDOSCOPY;  Service: Gastroenterology;  Laterality:  N/A;  . HOT HEMOSTASIS N/A 12/28/2014   Procedure: HOT HEMOSTASIS (ARGON PLASMA COAGULATION/BICAP);  Surgeon: Inda Castle, MD;  Location: [facility];  Service: Endoscopy;  Laterality: N/A;  . LEFT HEART CATHETERIZATION WITH CORONARY ANGIOGRAM N/A 04/23/2012   Procedure: LEFT HEART CATHETERIZATION WITH CORONARY ANGIOGRAM;  Surgeon: Larey Dresser, MD;  Location: [facility] CATH LAB;  Service: Cardiovascular;  Laterality: N/A;  . TONSILLECTOMY    . TUBAL LIGATION      reports that  has never smoked. she has never used smokeless tobacco. She reports that she does not drink alcohol or use drugs. family history includes Colon cancer in her brother; Prostate cancer in her brother; Sarcoidosis in her other and sister; Stomach cancer in her mother. Allergies  Allergen Reactions  .  Doxycycline Diarrhea    Possible diarrhea  . Biaxin [Clarithromycin] Other (See Comments)    diarrhea  . Metronidazole Other (See Comments)    Pt had difficulty swallowing this and says it was "horrible" to take. No allergic reaction.   Current Outpatient Medications on File Prior to Visit  Medication Sig Dispense Refill  . acetaminophen (TYLENOL) 500 MG tablet Take 2 tablets (1,000 mg total) by mouth 2 (two) times daily. 60 tablet 11  . atorvastatin (LIPITOR) 20 MG tablet TAKE 1 TABLET (20 MG TOTAL) BY MOUTH DAILY. 90 tablet 1  . ferrous sulfate 325 (65 FE) MG tablet Take 325 mg by mouth 3 (three) times daily with meals.    . metoprolol succinate (TOPROL-XL) 25 MG 24 hr tablet TAKE 1 TABLET EVERY DAY**INS TO PAY 12/14 90 tablet 0  . Multiple Vitamin (MULTIVITAMIN) tablet Take 1 tablet by mouth daily.    . nitroGLYCERIN (NITROSTAT) 0.4 MG SL tablet Place 1 tablet (0.4 mg total) under the tongue every 5 (five) minutes as needed for chest pain (maximum of 3 tablets). 25 tablet 2   No current facility-administered medications on file prior to visit.    Review of Systems  Constitutional: Negative for other unusual diaphoresis or sweats HENT: Negative for ear discharge or swelling Eyes: Negative for other worsening visual disturbances Respiratory: Negative for stridor or other swelling  Gastrointestinal: Negative for worsening distension or other blood Genitourinary: Negative for retention or other urinary change Musculoskeletal: Negative for other MSK pain or swelling Skin: Negative for color change or other new lesions Neurological: Negative for worsening tremors and other numbness  Psychiatric/Behavioral: Negative for worsening agitation or other fatigue All other system neg per pt    Objective:   Physical Exam BP 110/72   Pulse 99   Temp 97.8 F (36.6 C) (Oral)   Ht 5\' 4"  (1.626 m)   Wt 107 lb (48.5 kg)   SpO2 99%   BMI 18.37 kg/m  VS noted,  Constitutional: Pt appears in  NAD HENT: Head: NCAT.  Right Ear: External ear normal.  Left Ear: External ear normal.  Eyes: . Pupils are equal, round, and reactive to light. Conjunctivae and EOM are normal Nose: without d/c or deformity Neck: Neck supple. Gross normal ROM, has mid left neck approx 1 cm subq firm but not hard mass and somewhat mobile;  Also has right mid to lower neck hard lesion about 1/2 cm or less that seems possibly more fixed.  No other significant LA Cardiovascular: Normal rate and regular rhythm.   Pulmonary/Chest: Effort normal and breath sounds without rales or wheezing.  Abd:  Soft, NT, + BS Neurological: Pt is alert. At baseline orientation, motor grossly intact  Skin: Skin is warm. No rashes, other new lesions, no LE edema Psychiatric: Pt behavior is normal without agitation  No other exam findings Lab Results  Component Value Date   WBC 6.0 01/14/2017   HGB 11.5 (L) 01/14/2017   HCT 35.5 (L) 01/14/2017   PLT 270.0 01/14/2017   GLUCOSE 99 09/25/2016   CHOL 172 09/25/2016   TRIG 120.0 09/25/2016   HDL 75.40 09/25/2016   LDLDIRECT 223.7 01/08/2012   LDLCALC 73 09/25/2016   ALT 13 09/25/2016   AST 17 09/25/2016   NA 132 (L) 09/25/2016   K 3.9 09/25/2016   CL 95 (L) 09/25/2016   CREATININE 1.06 09/25/2016   BUN 14 09/25/2016   CO2 28 09/25/2016   TSH 1.24 09/25/2016   INR 1.06 01/20/2016   HGBA1C 6.2 09/25/2016       Assessment & Plan:

## 2017-04-01 NOTE — Assessment & Plan Note (Signed)
stable overall by history and exam, recent data reviewed with pt, and pt to continue medical treatment as before,  to f/u any worsening symptoms or concerns BP Readings from Last 3 Encounters:  04/01/17 110/72  01/14/17 120/78  01/05/17 140/90

## 2017-04-01 NOTE — Assessment & Plan Note (Signed)
stable overall by history and exam, recent data reviewed with pt, and pt to continue medical treatment as before,  to f/u any worsening symptoms or concerns Lab Results  Component Value Date   LDLCALC 73 09/25/2016

## 2017-04-09 DIAGNOSIS — R221 Localized swelling, mass and lump, neck: Secondary | ICD-10-CM | POA: Diagnosis not present

## 2017-04-15 ENCOUNTER — Ambulatory Visit: Payer: Self-pay | Admitting: *Deleted

## 2017-04-15 NOTE — Telephone Encounter (Signed)
Pt having symptoms of a cold. Denies fever. Just a runny nose and stuffy nose at times. She is requesting home care advice.  Home care advice given with verbal understanding.  Reason for Disposition . Cold with no complications  Answer Assessment - Initial Assessment Questions 1. ONSET: "When did the nasal discharge start?"      Started yesterday 2. AMOUNT: "How much discharge is there?"      moderate 3. COUGH: "Do you have a cough?" If yes, ask: "Describe the color of your sputum" (clear, white, yellow, green)     Dry sporadic cough 4. RESPIRATORY DISTRESS: "Describe your breathing."      stuffiness 5. FEVER: "Do you have a fever?" If so, ask: "What is your temperature, how was it measured, and when did it start?"     No fever 6. SEVERITY: "Overall, how bad are you feeling right now?" (e.g., doesn't interfere with normal activities, staying home from school/work, staying in bed)      doesnt interfere with normal activities 7. OTHER SYMPTOMS: "Do you have any other symptoms?" (e.g., sore throat, earache, wheezing, vomiting)     no 8. PREGNANCY: "Is there any chance you are pregnant?" "When was your last menstrual period?"     n/a  Protocols used: COMMON COLD-A-AH

## 2017-05-01 ENCOUNTER — Other Ambulatory Visit: Payer: Self-pay | Admitting: Internal Medicine

## 2017-05-26 ENCOUNTER — Ambulatory Visit (INDEPENDENT_AMBULATORY_CARE_PROVIDER_SITE_OTHER): Payer: Medicare Other | Admitting: *Deleted

## 2017-05-26 VITALS — BP 147/87 | HR 90 | Resp 18 | Ht 64.0 in | Wt 111.0 lb

## 2017-05-26 DIAGNOSIS — Z0000 Encounter for general adult medical examination without abnormal findings: Secondary | ICD-10-CM | POA: Diagnosis not present

## 2017-05-26 NOTE — Patient Instructions (Addendum)
Continue doing brain stimulating activities (puzzles, reading, adult coloring books, staying active) to keep memory sharp.   Continue to eat heart healthy diet (full of fruits, vegetables, whole grains, lean protein, water--limit salt, fat, and sugar intake) and increase physical activity as tolerated.   Lindsay Doyle , Thank you for taking time to come for your Medicare Wellness Visit. I appreciate your ongoing commitment to your health goals. Please review the following plan we discussed and let me know if I can assist you in the future.   These are the goals we discussed: Goals    . Patient Stated     Maintain current healthy status, stay as active and as independent as possible       This is a list of the screening recommended for you and due dates:  Health Maintenance  Topic Date Due  . Tetanus Vaccine  07/18/2018  . Flu Shot  Completed  . DEXA scan (bone density measurement)  Completed  . Pneumonia vaccines  Completed     Fall Prevention in the Home Falls can cause injuries. They can happen to people of all ages. There are many things you can do to make your home safe and to help prevent falls. What can I do on the outside of my home?  Regularly fix the edges of walkways and driveways and fix any cracks.  Remove anything that might make you trip as you walk through a door, such as a raised step or threshold.  Trim any bushes or trees on the path to your home.  Use bright outdoor lighting.  Clear any walking paths of anything that might make someone trip, such as rocks or tools.  Regularly check to see if handrails are loose or broken. Make sure that both sides of any steps have handrails.  Any raised decks and porches should have guardrails on the edges.  Have any leaves, snow, or ice cleared regularly.  Use sand or salt on walking paths during winter.  Clean up any spills in your garage right away. This includes oil or grease spills. What can I do in the  bathroom?  Use night lights.  Install grab bars by the toilet and in the tub and shower. Do not use towel bars as grab bars.  Use non-skid mats or decals in the tub or shower.  If you need to sit down in the shower, use a plastic, non-slip stool.  Keep the floor dry. Clean up any water that spills on the floor as soon as it happens.  Remove soap buildup in the tub or shower regularly.  Attach bath mats securely with double-sided non-slip rug tape.  Do not have throw rugs and other things on the floor that can make you trip. What can I do in the bedroom?  Use night lights.  Make sure that you have a light by your bed that is easy to reach.  Do not use any sheets or blankets that are too big for your bed. They should not hang down onto the floor.  Have a firm chair that has side arms. You can use this for support while you get dressed.  Do not have throw rugs and other things on the floor that can make you trip. What can I do in the kitchen?  Clean up any spills right away.  Avoid walking on wet floors.  Keep items that you use a lot in easy-to-reach places.  If you need to reach something above you, use a  strong step stool that has a grab bar.  Keep electrical cords out of the way.  Do not use floor polish or wax that makes floors slippery. If you must use wax, use non-skid floor wax.  Do not have throw rugs and other things on the floor that can make you trip. What can I do with my stairs?  Do not leave any items on the stairs.  Make sure that there are handrails on both sides of the stairs and use them. Fix handrails that are broken or loose. Make sure that handrails are as long as the stairways.  Check any carpeting to make sure that it is firmly attached to the stairs. Fix any carpet that is loose or worn.  Avoid having throw rugs at the top or bottom of the stairs. If you do have throw rugs, attach them to the floor with carpet tape.  Make sure that you have a  light switch at the top of the stairs and the bottom of the stairs. If you do not have them, ask someone to add them for you. What else can I do to help prevent falls?  Wear shoes that: ? Do not have high heels. ? Have rubber bottoms. ? Are comfortable and fit you well. ? Are closed at the toe. Do not wear sandals.  If you use a stepladder: ? Make sure that it is fully opened. Do not climb a closed stepladder. ? Make sure that both sides of the stepladder are locked into place. ? Ask someone to hold it for you, if possible.  Clearly mark and make sure that you can see: ? Any grab bars or handrails. ? First and last steps. ? Where the edge of each step is.  Use tools that help you move around (mobility aids) if they are needed. These include: ? Canes. ? Walkers. ? Scooters. ? Crutches.  Turn on the lights when you go into a dark area. Replace any light bulbs as soon as they burn out.  Set up your furniture so you have a clear path. Avoid moving your furniture around.  If any of your floors are uneven, fix them.  If there are any pets around you, be aware of where they are.  Review your medicines with your doctor. Some medicines can make you feel dizzy. This can increase your chance of falling. Ask your doctor what other things that you can do to help prevent falls. This information is not intended to replace advice given to you by your health care provider. Make sure you discuss any questions you have with your health care provider. Document Released: 01/11/2009 Document Revised: 08/23/2015 Document Reviewed: 04/21/2014 Elsevier Interactive Patient Education  2018 [place] Maintenance, Female Adopting a healthy lifestyle and getting preventive care can go a long way to promote health and wellness. Talk with your health care provider about what schedule of regular examinations is right for you. This is a good chance for you to check in with your provider about  disease prevention and staying healthy. In between checkups, there are plenty of things you can do on your own. Experts have done a lot of research about which lifestyle changes and preventive measures are most likely to keep you healthy. Ask your health care provider for more information. Weight and diet Eat a healthy diet  Be sure to include plenty of vegetables, fruits, low-fat dairy products, and lean protein.  Do not eat a lot of foods high in  solid fats, added sugars, or salt.  Get regular exercise. This is one of the most important things you can do for your health. ? Most adults should exercise for at least 150 minutes each week. The exercise should increase your heart rate and make you sweat (moderate-intensity exercise). ? Most adults should also do strengthening exercises at least twice a week. This is in addition to the moderate-intensity exercise.  Maintain a healthy weight  Body mass index (BMI) is a measurement that can be used to identify possible weight problems. It estimates body fat based on height and weight. Your health care provider can help determine your BMI and help you achieve or maintain a healthy weight.  For females 81 years of age and older: ? A BMI below 18.5 is considered underweight. ? A BMI of 18.5 to 24.9 is normal. ? A BMI of 25 to 29.9 is considered overweight. ? A BMI of 30 and above is considered obese.  Watch levels of cholesterol and blood lipids  You should start having your blood tested for lipids and cholesterol at 82 years of age, then have this test every 5 years.  You may need to have your cholesterol levels checked more often if: ? Your lipid or cholesterol levels are high. ? You are older than 82 years of age. ? You are at high risk for heart disease.  Cancer screening Lung Cancer  Lung cancer screening is recommended for adults 64-67 years old who are at high risk for lung cancer because of a history of smoking.  A yearly low-dose  CT scan of the lungs is recommended for people who: ? Currently smoke. ? Have quit within the past 15 years. ? Have at least a 30-pack-year history of smoking. A pack year is smoking an average of one pack of cigarettes a day for 1 year.  Yearly screening should continue until it has been 15 years since you quit.  Yearly screening should stop if you develop a health problem that would prevent you from having lung cancer treatment.  Breast Cancer  Practice breast self-awareness. This means understanding how your breasts normally appear and feel.  It also means doing regular breast self-exams. Let your health care provider know about any changes, no matter how small.  If you are in your 20s or 30s, you should have a clinical breast exam (CBE) by a health care provider every 1-3 years as part of a regular health exam.  If you are 82 or older, have a CBE every year. Also consider having a breast X-ray (mammogram) every year.  If you have a family history of breast cancer, talk to your health care provider about genetic screening.  If you are at high risk for breast cancer, talk to your health care provider about having an MRI and a mammogram every year.  Breast cancer gene (BRCA) assessment is recommended for women who have family members with BRCA-related cancers. BRCA-related cancers include: ? Breast. ? Ovarian. ? Tubal. ? Peritoneal cancers.  Results of the assessment will determine the need for genetic counseling and BRCA1 and BRCA2 testing.  Cervical Cancer Your health care provider may recommend that you be screened regularly for cancer of the pelvic organs (ovaries, uterus, and vagina). This screening involves a pelvic examination, including checking for microscopic changes to the surface of your cervix (Pap test). You may be encouraged to have this screening done every 3 years, beginning at age 29.  For women ages 87-65, health care providers  may recommend pelvic exams and Pap  testing every 3 years, or they may recommend the Pap and pelvic exam, combined with testing for human papilloma virus (HPV), every 5 years. Some types of HPV increase your risk of cervical cancer. Testing for HPV may also be done on women of any age with unclear Pap test results.  Other health care providers may not recommend any screening for nonpregnant women who are considered low risk for pelvic cancer and who do not have symptoms. Ask your health care provider if a screening pelvic exam is right for you.  If you have had past treatment for cervical cancer or a condition that could lead to cancer, you need Pap tests and screening for cancer for at least 20 years after your treatment. If Pap tests have been discontinued, your risk factors (such as having a new sexual partner) need to be reassessed to determine if screening should resume. Some women have medical problems that increase the chance of getting cervical cancer. In these cases, your health care provider may recommend more frequent screening and Pap tests.  Colorectal Cancer  This type of cancer can be detected and often prevented.  Routine colorectal cancer screening usually begins at 82 years of age and continues through 82 years of age.  Your health care provider may recommend screening at an earlier age if you have risk factors for colon cancer.  Your health care provider may also recommend using home test kits to check for hidden blood in the stool.  A small camera at the end of a tube can be used to examine your colon directly (sigmoidoscopy or colonoscopy). This is done to check for the earliest forms of colorectal cancer.  Routine screening usually begins at age 51.  Direct examination of the colon should be repeated every 5-10 years through 82 years of age. However, you may need to be screened more often if early forms of precancerous polyps or small growths are found.  Skin Cancer  Check your skin from head to toe  regularly.  Tell your health care provider about any new moles or changes in moles, especially if there is a change in a mole's shape or color.  Also tell your health care provider if you have a mole that is larger than the size of a pencil eraser.  Always use sunscreen. Apply sunscreen liberally and repeatedly throughout the day.  Protect yourself by wearing long sleeves, pants, a wide-brimmed hat, and sunglasses whenever you are outside.  Heart disease, diabetes, and high blood pressure  High blood pressure causes heart disease and increases the risk of stroke. High blood pressure is more likely to develop in: ? People who have blood pressure in the high end of the normal range (130-139/85-89 mm Hg). ? People who are overweight or obese. ? People who are African American.  If you are 16-32 years of age, have your blood pressure checked every 3-5 years. If you are 39 years of age or older, have your blood pressure checked every year. You should have your blood pressure measured twice-once when you are at a hospital or clinic, and once when you are not at a hospital or clinic. Record the average of the two measurements. To check your blood pressure when you are not at a hospital or clinic, you can use: ? An automated blood pressure machine at a pharmacy. ? A home blood pressure monitor.  If you are between 84 years and 50 years old, ask your health  care provider if you should take aspirin to prevent strokes.  Have regular diabetes screenings. This involves taking a blood sample to check your fasting blood sugar level. ? If you are at a normal weight and have a low risk for diabetes, have this test once every three years after 82 years of age. ? If you are overweight and have a high risk for diabetes, consider being tested at a younger age or more often. Preventing infection Hepatitis B  If you have a higher risk for hepatitis B, you should be screened for this virus. You are considered  at high risk for hepatitis B if: ? You were born in a country where hepatitis B is common. Ask your health care provider which countries are considered high risk. ? Your parents were born in a high-risk country, and you have not been immunized against hepatitis B (hepatitis B vaccine). ? You have HIV or AIDS. ? You use needles to inject street drugs. ? You live with someone who has hepatitis B. ? You have had sex with someone who has hepatitis B. ? You get hemodialysis treatment. ? You take certain medicines for conditions, including cancer, organ transplantation, and autoimmune conditions.  Hepatitis C  Blood testing is recommended for: ? Everyone born from [birth year] through 1963. ? Anyone with known risk factors for hepatitis C.  Sexually transmitted infections (STIs)  You should be screened for sexually transmitted infections (STIs) including gonorrhea and chlamydia if: ? You are sexually active and are younger than 82 years of age. ? You are older than 82 years of age and your health care provider tells you that you are at risk for this type of infection. ? Your sexual activity has changed since you were last screened and you are at an increased risk for chlamydia or gonorrhea. Ask your health care provider if you are at risk.  If you do not have HIV, but are at risk, it may be recommended that you take a prescription medicine daily to prevent HIV infection. This is called pre-exposure prophylaxis (PrEP). You are considered at risk if: ? You are sexually active and do not regularly use condoms or know the HIV status of your partner(s). ? You take drugs by injection. ? You are sexually active with a partner who has HIV.  Talk with your health care provider about whether you are at high risk of being infected with HIV. If you choose to begin PrEP, you should first be tested for HIV. You should then be tested every 3 months for as long as you are taking PrEP. Pregnancy  If you are  premenopausal and you may become pregnant, ask your health care provider about preconception counseling.  If you may become pregnant, take 400 to 800 micrograms (mcg) of folic acid every day.  If you want to prevent pregnancy, talk to your health care provider about birth control (contraception). Osteoporosis and menopause  Osteoporosis is a disease in which the bones lose minerals and strength with aging. This can result in serious bone fractures. Your risk for osteoporosis can be identified using a bone density scan.  If you are 1 years of age or older, or if you are at risk for osteoporosis and fractures, ask your health care provider if you should be screened.  Ask your health care provider whether you should take a calcium or vitamin D supplement to lower your risk for osteoporosis.  Menopause may have certain physical symptoms and risks.  Hormone replacement therapy  may reduce some of these symptoms and risks. Talk to your health care provider about whether hormone replacement therapy is right for you. Follow these instructions at home:  Schedule regular health, dental, and eye exams.  Stay current with your immunizations.  Do not use any tobacco products including cigarettes, chewing tobacco, or electronic cigarettes.  If you are pregnant, do not drink alcohol.  If you are breastfeeding, limit how much and how often you drink alcohol.  Limit alcohol intake to no more than 1 drink per day for nonpregnant women. One drink equals 12 ounces of beer, 5 ounces of [place], or 1 ounces of hard liquor.  Do not use street drugs.  Do not share needles.  Ask your health care provider for help if you need support or information about quitting drugs.  Tell your health care provider if you often feel depressed.  Tell your health care provider if you have ever been abused or do not feel safe at home. This information is not intended to replace advice given to you by your health care  provider. Make sure you discuss any questions you have with your health care provider. Document Released: 09/30/2010 Document Revised: 08/23/2015 Document Reviewed: 12/19/2014 Elsevier Interactive Patient Education  [employer].

## 2017-05-26 NOTE — Progress Notes (Addendum)
Subjective:   Lindsay Doyle is a 84 y.o. female who presents for Medicare Annual (Subsequent) preventive examination.  Review of Systems:  No ROS.  Medicare Wellness Visit. Additional risk factors are reflected in the social history.    Sleep patterns: feels rested on waking, gets up 1-2 times nightly to void and sleeps 6-7 hours nightly.   Home Safety/Smoke Alarms: Feels safe in home. Smoke alarms in place.  Living environment; residence and Firearm Safety: 1-story house/ trailer, no firearms. Lives with husband, no needs for DME, good support system Seat Belt Safety/Bike Helmet: Wears seat belt.     Objective:     Vitals: There were no vitals taken for this visit.  There is no height or weight on file to calculate BMI.  Advanced Directives 05/20/2016 01/19/2016 01/19/2016 04/27/2015 12/28/2014 11/14/2014 05/08/2014  Does Patient Have a Medical Advance Directive? Yes No No Yes Yes No Yes  Type of Paramedic of [place];Living will - - [place];Living will [place];Living will - Living will;Healthcare Power of Attorney  Does patient want to make changes to medical advance directive? - - - No - Patient declined - - No - Patient declined  Copy of [place] in Chart? No - copy requested - - No - copy requested No - copy requested - No - copy requested  Would patient like information on creating a medical advance directive? - No - patient declined information - - - No - patient declined information -    Tobacco Social History   Tobacco Use  Smoking Status Never Smoker  Smokeless Tobacco Never Used     Counseling given: Not Answered  Past Medical History:  Diagnosis Date  . ANEMIA-IRON DEFICIENCY    presumed GI bleed 08/2012- anticoag stopped  . Asthma    per pt, she does not have asthma  . Atrial fibrillation ([place])    a. Apixaban started 03/2012 - stopped after admx for GI bleed 6/14  . Blood  transfusion    a. 05/2010: 3 transfusions for anemia/GIB.  [name] CAD (coronary artery disease)    a. LHC 1/14:  mLAD serial 30 and 60%, oRCA 30%, EF 60%, Afib,   . Cataract   . COLONIC POLYPS, HX OF 03/09/2007  . COPD 03/09/2007   ? pt is unsure  . GERD (gastroesophageal reflux disease)   . GI bleed    thought to be due to AVMs  . Hx of echocardiogram    a. echo 2/11:  EF 55-60%, mild AI, mild RAE, mild to mod TR;   b.  a. Echo 2/14:  Mild focal basal and mild LVH, EF 60-65%, mild AI, mild RAE, trivia effusion  . HYPERGLYCEMIA 07/02/2007  . Hyperlipidemia   . Hypertension 11/12/2006  . LEUKOPENIA, MILD 05/30/2008  . Mitral valve prolapse    Remotely - last echo 2011 did not show this  . NSTEMI (non-ST elevated myocardial infarction) ([place]) 03/2012   a. Type II in setting of AF with RVR 03/2012  . OSTEOARTHRITIS 02/19/2009   Spinal  . OSTEOPOROSIS 11/12/2006  . Positive H. pylori test 10/1996  . PSVT    a. Remotely, in setting of anemia.  [name] Spinal stenosis of lumbar region 07/16/2012  . Transfusion history    7/15    Past Surgical History:  Procedure Laterality Date  . ABDOMINAL HYSTERECTOMY    . CARDIAC CATHETERIZATION  1992   S/P in 1992 at [facility] in [place]. This was negative for any coronary artery disease  . carotid duplex  08/29/2003  . CATARACT EXTRACTION    . ceasarean    . ENTEROSCOPY N/A 10/10/2013   Procedure: ENTEROSCOPY;  Surgeon: Inda Castle, MD;  [facility] ENDOSCOPY;  Service: Endoscopy;  Laterality: N/A;  . ENTEROSCOPY N/A 12/28/2014   Procedure: ENTEROSCOPY;  Surgeon: Inda Castle, MD;  [facility] ENDOSCOPY;  Service: Endoscopy;  Laterality: N/A;  . ENTEROSCOPY N/A 01/20/2016   Procedure: ENTEROSCOPY;  Surgeon: Manus Gunning, MD;  Location: [facility] ENDOSCOPY;  Service: Gastroenterology;  Laterality: N/A;  . HOT HEMOSTASIS N/A 12/28/2014   Procedure: HOT HEMOSTASIS (ARGON PLASMA COAGULATION/BICAP);  Surgeon: Inda Castle, MD;   Location: [facility];  Service: Endoscopy;  Laterality: N/A;  . LEFT HEART CATHETERIZATION WITH CORONARY ANGIOGRAM N/A 04/23/2012   Procedure: LEFT HEART CATHETERIZATION WITH CORONARY ANGIOGRAM;  Surgeon: Larey Dresser, MD;  Location: [facility] CATH LAB;  Service: Cardiovascular;  Laterality: N/A;  . TONSILLECTOMY    . TUBAL LIGATION     Family History  Problem Relation Age of Onset  . Prostate cancer Brother   . Stomach cancer Mother   . Sarcoidosis Sister   . Colon cancer Brother   . Sarcoidosis Other   . CAD Neg Hx    Social History   Socioeconomic History  . Marital status: Married    Spouse name: Not on file  . Number of children: 1  . Years of education: Not on file  . Highest education level: Not on file  Social Needs  . Financial resource strain: Not on file  . Food insecurity - worry: Not on file  . Food insecurity - inability: Not on file  . Transportation needs - medical: Not on file  . Transportation needs - non-medical: Not on file  Occupational History  . Occupation: retired    Comment: retired  Tobacco Use  . Smoking status: Never Smoker  . Smokeless tobacco: Never Used  Substance and Sexual Activity  . Alcohol use: No  . Drug use: No  . Sexual activity: No  Other Topics Concern  . Not on file  Social History Narrative  . Not on file    Outpatient Encounter Medications as of 05/26/2017  Medication Sig  . acetaminophen (TYLENOL) 500 MG tablet Take 2 tablets (1,000 mg total) by mouth 2 (two) times daily.  [name] atorvastatin (LIPITOR) 20 MG tablet TAKE 1 TABLET (20 MG TOTAL) BY MOUTH DAILY.  [name] diclofenac sodium (VOLTAREN) 1 % GEL Apply 4 g topically 4 (four) times daily as needed.  . ferrous sulfate 325 (65 FE) MG tablet Take 325 mg by mouth 3 (three) times daily with meals.  . metoprolol succinate (TOPROL-XL) 25 MG 24 hr tablet TAKE 1 TABLET EVERY DAY  . Multiple Vitamin (MULTIVITAMIN) tablet Take 1 tablet by mouth daily.  . nitroGLYCERIN (NITROSTAT) 0.4 MG SL  tablet Place 1 tablet (0.4 mg total) under the tongue every 5 (five) minutes as needed for chest pain (maximum of 3 tablets).   No facility-administered encounter medications on file as of 05/26/2017.     Activities of Daily Living No flowsheet data found.  Patient Care Team: Biagio Borg, MD as PCP - General (Internal Medicine)    Assessment:   This is a routine wellness examination for Lindsay Doyle. Physical assessment deferred to PCP.   Exercise Activities and Dietary recommendations   Diet (meal preparation, eat out, water intake, caffeinated beverages, dairy  products, fruits and vegetables): in general, a "healthy" diet  , on average, 2 meals per day   Reviewed heart healthy diet, encouraged patient to increase daily water intake. Discussed not skipping meals and to drink nutritional supplements to boost nutrition.  Goals    None      Fall Risk Fall Risk  04/01/2017 05/20/2016 04/09/2016 10/05/2015 07/27/2014  Falls in the past year? Yes No No No No  Number falls in past yr: 1 - - - -  Injury with Fall? Yes - - - -  Comment broken toe - - - -    Depression Screen PHQ 2/9 Scores 05/20/2016 04/09/2016 10/05/2015 07/27/2014  PHQ - 2 Score 0 0 0 0     Cognitive Function MMSE - Mini Mental State Exam 05/20/2016  Orientation to time 5  Orientation to Place 5  Registration 3  Attention/ Calculation 5  Recall 1  Language- name 2 objects 2  Language- repeat 1  Language- follow 3 step command 3  Language- read & follow direction 1  Write a sentence 1  Copy design 1  Total score 28        Immunization History  Administered Date(s) Administered  . Influenza Split 03/10/2011, 01/08/2012  . Influenza Whole 02/07/2008, 02/05/2009, 02/18/2010  . Influenza, High Dose Seasonal PF 01/25/2013, 03/06/2015, 01/16/2017  . Influenza,inj,Quad PF,6+ Mos 01/25/2014, 01/03/2016  . Pneumococcal Conjugate-13 02/08/2013  . Pneumococcal Polysaccharide-23 12/29/1997, 01/08/2012  . Td 07/29/1996,  07/17/2008      Screening Tests Health Maintenance  Topic Date Due  . TETANUS/TDAP  07/18/2018  . INFLUENZA VACCINE  Completed  . DEXA SCAN  Completed  . PNA vac Low Risk Adult  Completed       Plan:     I have personally reviewed and noted the following in the patient's chart:   . Medical and social history . Use of alcohol, tobacco or illicit drugs  . Current medications and supplements . Functional ability and status . Nutritional status . Physical activity . Advanced directives . List of other physicians . Vitals . Screenings to include cognitive, depression, and falls . Referrals and appointments  In addition, I have reviewed and discussed with patient certain preventive protocols, quality metrics, and best practice recommendations. A written personalized care plan for preventive services as well as general preventive health recommendations were provided to patient.     Michiel Cowboy, RN  05/26/2017  Medical screening examination/treatment/procedure(s) were performed by non-physician practitioner and as supervising physician I was immediately available for consultation/collaboration. I agree with above. Cathlean Cower, MD

## 2017-06-03 DIAGNOSIS — Z1231 Encounter for screening mammogram for malignant neoplasm of breast: Secondary | ICD-10-CM | POA: Diagnosis not present

## 2017-06-03 DIAGNOSIS — Z01419 Encounter for gynecological examination (general) (routine) without abnormal findings: Secondary | ICD-10-CM | POA: Diagnosis not present

## 2017-06-03 DIAGNOSIS — Z6820 Body mass index (BMI) 20.0-20.9, adult: Secondary | ICD-10-CM | POA: Diagnosis not present

## 2017-06-24 DIAGNOSIS — H5212 Myopia, left eye: Secondary | ICD-10-CM | POA: Diagnosis not present

## 2017-06-24 DIAGNOSIS — H02051 Trichiasis without entropian right upper eyelid: Secondary | ICD-10-CM | POA: Diagnosis not present

## 2017-06-24 DIAGNOSIS — H26491 Other secondary cataract, right eye: Secondary | ICD-10-CM | POA: Diagnosis not present

## 2017-06-24 DIAGNOSIS — H52222 Regular astigmatism, left eye: Secondary | ICD-10-CM | POA: Diagnosis not present

## 2017-06-24 DIAGNOSIS — H52221 Regular astigmatism, right eye: Secondary | ICD-10-CM | POA: Diagnosis not present

## 2017-06-24 DIAGNOSIS — Z961 Presence of intraocular lens: Secondary | ICD-10-CM | POA: Diagnosis not present

## 2017-06-24 DIAGNOSIS — H02054 Trichiasis without entropian left upper eyelid: Secondary | ICD-10-CM | POA: Diagnosis not present

## 2017-07-09 ENCOUNTER — Ambulatory Visit: Payer: Medicare Other | Admitting: Cardiovascular Disease

## 2017-07-16 ENCOUNTER — Telehealth: Payer: Self-pay

## 2017-07-16 DIAGNOSIS — D649 Anemia, unspecified: Secondary | ICD-10-CM

## 2017-07-16 NOTE — Telephone Encounter (Signed)
-----   Message from Roetta Sessions, [place] sent at 01/15/2017  4:13 PM EDT ----- Regarding: repeat CBC due Pt due for repeat CBC w/ diff for anemia.  Due 06-2017.

## 2017-07-16 NOTE — Telephone Encounter (Signed)
Called and LM for pt to call back regarding repeat lab work due.

## 2017-07-16 NOTE — Telephone Encounter (Signed)
Pt returned your call, pls call her again, she will be at home for another hour. Thank you

## 2017-07-16 NOTE — Telephone Encounter (Signed)
Spoke to pt. She indicated she can go to the lab one day next week.

## 2017-07-20 ENCOUNTER — Other Ambulatory Visit (INDEPENDENT_AMBULATORY_CARE_PROVIDER_SITE_OTHER): Payer: Medicare Other

## 2017-07-20 DIAGNOSIS — D649 Anemia, unspecified: Secondary | ICD-10-CM

## 2017-07-20 LAB — CBC WITH DIFFERENTIAL/PLATELET
BASOS PCT: 0.4 % (ref 0.0–3.0)
Basophils Absolute: 0 10*3/uL (ref 0.0–0.1)
EOS PCT: 6.2 % — AB (ref 0.0–5.0)
Eosinophils Absolute: 0.5 10*3/uL (ref 0.0–0.7)
HEMATOCRIT: 35.5 % — AB (ref 36.0–46.0)
HEMOGLOBIN: 12.1 g/dL (ref 12.0–15.0)
Lymphocytes Relative: 45.9 % (ref 12.0–46.0)
Lymphs Abs: 3.5 10*3/uL (ref 0.7–4.0)
MCHC: 34.2 g/dL (ref 30.0–36.0)
MCV: 99.7 fl (ref 78.0–100.0)
Monocytes Absolute: 0.8 10*3/uL (ref 0.1–1.0)
Monocytes Relative: 10.4 % (ref 3.0–12.0)
NEUTROS ABS: 2.9 10*3/uL (ref 1.4–7.7)
Neutrophils Relative %: 37.1 % — ABNORMAL LOW (ref 43.0–77.0)
PLATELETS: 254 10*3/uL (ref 150.0–400.0)
RBC: 3.56 Mil/uL — ABNORMAL LOW (ref 3.87–5.11)
RDW: 13.7 % (ref 11.5–15.5)
WBC: 7.7 10*3/uL (ref 4.0–10.5)

## 2017-08-06 ENCOUNTER — Other Ambulatory Visit: Payer: Self-pay | Admitting: Internal Medicine

## 2017-08-31 ENCOUNTER — Encounter: Payer: Self-pay | Admitting: Cardiovascular Disease

## 2017-08-31 ENCOUNTER — Ambulatory Visit: Payer: Medicare Other | Admitting: Cardiovascular Disease

## 2017-08-31 ENCOUNTER — Ambulatory Visit (INDEPENDENT_AMBULATORY_CARE_PROVIDER_SITE_OTHER): Payer: Medicare Other | Admitting: Cardiovascular Disease

## 2017-08-31 VITALS — BP 110/88 | HR 88 | Ht 64.0 in | Wt 111.0 lb

## 2017-08-31 DIAGNOSIS — I2510 Atherosclerotic heart disease of native coronary artery without angina pectoris: Secondary | ICD-10-CM

## 2017-08-31 DIAGNOSIS — I10 Essential (primary) hypertension: Secondary | ICD-10-CM

## 2017-08-31 NOTE — Progress Notes (Signed)
Cardiology Office Note Date:  08/31/2017   ID:  Lindsay Doyle, [place] Jan 27, 1934, MRN [id number]  PCP:  Biagio Borg, MD  Cardiologist:  Sherren Mocha, MD    Chief Complaint  Patient presents with  . Palpitations     History of Present Illness: Lindsay Doyle is a 84 y.o. female who presents for  follow-up of paroxysmal atrial fibrillation.  She was initially diagnosed with atrial fibrillation in 2014 when she presented with AF with RVR and NSTEMI. She underwent left heart catheterization showing nonobstructive CAD at that time.  The patient is here with her husband today.  She has occasional heart palpitations, generally occurring at night.  When she feels that she takes 12.5 mg of metoprolol succinate and states that symptoms resolved within about 15 to 20 minutes.  She has had no prolonged symptoms.  She denies lightheadedness, shortness of breath, or chest pain.  She has had no leg swelling, orthopnea, or PND.  Past Medical History:  Diagnosis Date  . ANEMIA-IRON DEFICIENCY    presumed GI bleed 08/2012- anticoag stopped  . Asthma    per pt, she does not have asthma  . Atrial fibrillation ([place])    a. Apixaban started 03/2012 - stopped after admx for GI bleed 6/14  . Blood transfusion    a. 05/2010: 3 transfusions for anemia/GIB.  [name] CAD (coronary artery disease)    a. LHC 1/14:  mLAD serial 30 and 60%, oRCA 30%, EF 60%, Afib,   . Cataract   . COLONIC POLYPS, HX OF 03/09/2007  . COPD 03/09/2007   ? pt is unsure  . GERD (gastroesophageal reflux disease)   . GI bleed    thought to be due to AVMs  . Hx of echocardiogram    a. echo 2/11:  EF 55-60%, mild AI, mild RAE, mild to mod TR;   b.  a. Echo 2/14:  Mild focal basal and mild LVH, EF 60-65%, mild AI, mild RAE, trivia effusion  . HYPERGLYCEMIA 07/02/2007  . Hyperlipidemia   . Hypertension 11/12/2006  . LEUKOPENIA, MILD 05/30/2008  . Mitral valve prolapse    Remotely - last echo 2011 did not show this  . NSTEMI (non-ST  elevated myocardial infarction) ([place]) 03/2012   a. Type II in setting of AF with RVR 03/2012  . OSTEOARTHRITIS 02/19/2009   Spinal  . OSTEOPOROSIS 11/12/2006  . Positive H. pylori test 10/1996  . PSVT    a. Remotely, in setting of anemia.  [name] Spinal stenosis of lumbar region 07/16/2012  . Transfusion history    7/15     Past Surgical History:  Procedure Laterality Date  . ABDOMINAL HYSTERECTOMY    . CARDIAC CATHETERIZATION  1992   S/P in 1992 at [facility] in [place]. This was negative for any coronary artery disease  . carotid duplex  08/29/2003  . CATARACT EXTRACTION    . ceasarean    . ENTEROSCOPY N/A 10/10/2013   Procedure: ENTEROSCOPY;  Surgeon: Inda Castle, MD;  [facility] END[facility];  Service: Endoscopy;  Laterality: N/A;  . ENTEROSCOPY N/A 12/28/2014   Procedure: ENTEROSCOPY;  Surgeon: Inda Castle, MD;  [facility] ENDOSCOPY;  Service: Endoscopy;  Laterality: N/A;  . ENTEROSCOPY N/A 01/20/2016   Procedure: ENTEROSCOPY;  Surgeon: Manus Gunning, MD;  Location: [facility] ENDOSCOPY;  Service: Gastroenterology;  Laterality: N/A;  . HOT HEMOSTASIS N/A 12/28/2014   Procedure: HOT HEMOSTASIS (ARGON PLASMA COAGULATION/BICAP);  Surgeon: Inda Castle,  MD;  [facility] ENDOS[facility];  Service: Endoscopy;  Laterality: N/A;  . LEFT HEART CATHETERIZATION WITH CORONARY ANGIOGRAM N/A 04/23/2012   Procedure: LEFT HEART CATHETERIZATION WITH CORONARY ANGIOGRAM;  Surgeon: Larey Dresser, MD;  Location: [facility] CATH LAB;  Service: Cardiovascular;  Laterality: N/A;  . TONSILLECTOMY    . TUBAL LIGATION      Current Outpatient Medications  Medication Sig Dispense Refill  . acetaminophen (TYLENOL) 500 MG tablet Take 2 tablets (1,000 mg total) by mouth 2 (two) times daily. 60 tablet 11  . atorvastatin (LIPITOR) 20 MG tablet TAKE 1 TABLET (20 MG TOTAL) BY MOUTH DAILY. 90 tablet 1  . ferrous sulfate 325 (65 FE) MG tablet Take 325 mg by mouth 2 (two) times daily with a meal.      . metoprolol succinate (TOPROL-XL) 25 MG 24 hr tablet Take 25 mg by mouth daily.    . Multiple Vitamin (MULTIVITAMIN) tablet Take 1 tablet by mouth daily.    . nitroGLYCERIN (NITROSTAT) 0.4 MG SL tablet Place 1 tablet (0.4 mg total) under the tongue every 5 (five) minutes as needed for chest pain (maximum of 3 tablets). 25 tablet 2   No current facility-administered medications for this visit.     Allergies:   Doxycycline; Biaxin [clarithromycin]; and Metronidazole   Social History:  The patient  reports that she has never smoked. She has never used smokeless tobacco. She reports that she does not drink alcohol or use drugs.   Family History:  The patient's family history includes Colon cancer in her brother; Prostate cancer in her brother; Sarcoidosis in her other and sister; Stomach cancer in her mother.    ROS:  Please see the history of present illness.   All other systems are reviewed and negative.    PHYSICAL EXAM: VS:  BP 110/88   Pulse 88   Ht 5\' 4"  (1.626 m)   Wt 111 lb (50.3 kg)   SpO2 98%   BMI 19.05 kg/m  , BMI Body mass index is 19.05 kg/m. GEN: Well nourished, well developed, pleasant elderly woman in no acute distress  HEENT: normal  Neck: no JVD, no masses. No carotid bruits Cardiac: RRR without murmur or gallop    Respiratory:  clear to auscultation bilaterally, normal work of breathing GI: soft, nontender, nondistended, + BS MS: no deformity or atrophy  Ext: no pretibial edema, pedal pulses 2+= bilaterally Skin: warm and dry, no rash Neuro:  Strength and sensation are intact Psych: euthymic mood, full affect  EKG:  EKG is not ordered today.  Recent Labs: 09/25/2016: ALT 13; TSH 1.24 04/01/2017: BUN 15; Creatinine, Ser 1.01; Potassium 5.5; Sodium 140 07/20/2017: Hemoglobin 12.1; Platelets 254.0   Lipid Panel     Component Value Date/Time   CHOL 166 04/01/2017 0917   TRIG 107.0 04/01/2017 0917   HDL 62.00 04/01/2017 0917   CHOLHDL 3 04/01/2017 0917    VLDL 21.4 04/01/2017 0917   LDLCALC 83 04/01/2017 0917   LDLDIRECT 223.7 01/08/2012 0858     Wt Readings from Last 3 Encounters:  08/31/17 111 lb (50.3 kg)  05/26/17 111 lb (50.3 kg)  04/01/17 107 lb (48.5 kg)    Cardiac Studies Reviewed: Echo 01-20-2016: Study Conclusions  - Left ventricle: The cavity size was normal. Wall thickness was   increased in a pattern of mild LVH. Systolic function was   vigorous. The estimated ejection fraction was in the range of 65%   to 70%. Wall motion was normal; there were  no regional wall   motion abnormalities. Doppler parameters are consistent with   abnormal left ventricular relaxation (grade 1 diastolic   dysfunction). - Aortic valve: There was mild regurgitation. - Tricuspid valve: There was moderate regurgitation. - Pulmonary arteries: Systolic pressure was mildly increased. PA   peak pressure: 36 mm Hg (S). - Pericardium, extracardiac: A trivial pericardial effusion was   identified.  ASSESSMENT AND PLAN: Paroxysmal atrial fibrillation: Appears to be maintaining sinus rhythm.  Unable to tolerate any anticoagulant drugs.  Has not been interested in left atrial appendage therapies over past discussions.  Continue observation and management as outlined above with a beta-blocker.  She understands that if breakthrough symptoms progress, we could consider an antiarrhythmic drug.  Current medicines are reviewed with the patient today.  The patient does not have concerns regarding medicines.  Labs/ tests ordered today include:  No orders of the defined types were placed in this encounter.  Disposition:   FU 6 months  Signed, Sherren Mocha, MD  08/31/2017 1:53 PM    [facility] [place], [place],   [address] Phone: [phone]; Fax: [phone]

## 2017-08-31 NOTE — Patient Instructions (Signed)

## 2017-09-04 ENCOUNTER — Ambulatory Visit: Payer: Medicare Other | Admitting: Cardiovascular Disease

## 2017-09-20 ENCOUNTER — Other Ambulatory Visit: Payer: Self-pay | Admitting: Internal Medicine

## 2017-09-23 ENCOUNTER — Encounter: Payer: Self-pay | Admitting: Internal Medicine

## 2017-09-23 ENCOUNTER — Ambulatory Visit (INDEPENDENT_AMBULATORY_CARE_PROVIDER_SITE_OTHER): Payer: Medicare Other | Admitting: Internal Medicine

## 2017-09-23 VITALS — BP 112/86 | HR 88 | Ht 64.0 in | Wt 108.0 lb

## 2017-09-23 DIAGNOSIS — I10 Essential (primary) hypertension: Secondary | ICD-10-CM

## 2017-09-23 DIAGNOSIS — E785 Hyperlipidemia, unspecified: Secondary | ICD-10-CM

## 2017-09-23 DIAGNOSIS — N184 Chronic kidney disease, stage 4 (severe): Secondary | ICD-10-CM | POA: Diagnosis not present

## 2017-09-23 DIAGNOSIS — I2510 Atherosclerotic heart disease of native coronary artery without angina pectoris: Secondary | ICD-10-CM

## 2017-09-23 DIAGNOSIS — R739 Hyperglycemia, unspecified: Secondary | ICD-10-CM | POA: Diagnosis not present

## 2017-09-23 LAB — POCT GLYCOSYLATED HEMOGLOBIN (HGB A1C): Hemoglobin A1C: 5.9 % — AB (ref 4.0–5.6)

## 2017-09-23 NOTE — Progress Notes (Signed)
Subjective:    Patient ID: Lindsay Doyle, female    DOB: [birth year], 84 y.o.   MRN: [id number]  HPI  Here to f/u; overall doing ok,  Pt denies chest pain, increasing sob or doe, wheezing, orthopnea, PND, increased LE swelling, palpitations, dizziness or syncope.  Pt denies new neurological symptoms such as new headache, or facial or extremity weakness or numbness.  Pt denies polydipsia, polyuria, or low sugar episode.  Pt states overall good compliance with meds, mostly trying to follow appropriate diet, with wt overall stable Wt Readings from Last 3 Encounters:  09/23/17 108 lb (49 kg)  08/31/17 111 lb (50.3 kg)  05/26/17 111 lb (50.3 kg)  Trying to stay some active for age.   Pt denies fever, wt loss, night sweats, loss of appetite, or other constitutional symptoms   No other new complaints or interval change Past Medical History:  Diagnosis Date  . ANEMIA-IRON DEFICIENCY    presumed GI bleed 08/2012- anticoag stopped  . Asthma    per pt, she does not have asthma  . Atrial fibrillation ([place])    a. Apixaban started 03/2012 - stopped after admx for GI bleed 6/14  . Blood transfusion    a. 05/2010: 3 transfusions for anemia/GIB.  [name] CAD (coronary artery disease)    a. LHC 1/14:  mLAD serial 30 and 60%, oRCA 30%, EF 60%, Afib,   . Cataract   . COLONIC POLYPS, HX OF 03/09/2007  . COPD 03/09/2007   ? pt is unsure  . GERD (gastroesophageal reflux disease)   . GI bleed    thought to be due to AVMs  . Hx of echocardiogram    a. echo 2/11:  EF 55-60%, mild AI, mild RAE, mild to mod TR;   b.  a. Echo 2/14:  Mild focal basal and mild LVH, EF 60-65%, mild AI, mild RAE, trivia effusion  . HYPERGLYCEMIA 07/02/2007  . Hyperlipidemia   . Hypertension 11/12/2006  . LEUKOPENIA, MILD 05/30/2008  . Mitral valve prolapse    Remotely - last echo 2011 did not show this  . NSTEMI (non-ST elevated myocardial infarction) ([place]) 03/2012   a. Type II in setting of AF with RVR 03/2012  . OSTEOARTHRITIS 02/19/2009     Spinal  . OSTEOPOROSIS 11/12/2006  . Positive H. pylori test 10/1996  . PSVT    a. Remotely, in setting of anemia.  [name] Spinal stenosis of lumbar region 07/16/2012  . Transfusion history    7/15    Past Surgical History:  Procedure Laterality Date  . ABDOMINAL HYSTERECTOMY    . CARDIAC CATHETERIZATION  1992   S/P in 1992 at [facility] in [place]. This was negative for any coronary artery disease  . carotid duplex  08/29/2003  . CATARACT EXTRACTION    . ceasarean    . ENTEROSCOPY N/A 10/10/2013   Procedure: ENTEROSCOPY;  Surgeon: Inda Castle, MD;  [facility] ENDOSCOPY;  Service: Endoscopy;  Laterality: N/A;  . ENTEROSCOPY N/A 12/28/2014   Procedure: ENTEROSCOPY;  Surgeon: Inda Castle, MD;  Location[facility] ENDOSCOPY;  Service: Endoscopy;  Laterality: N/A;  . ENTEROSCOPY N/A 01/20/2016   Procedure: ENTEROSCOPY;  Surgeon: Manus Gunning, MD;  Location: [facility] ENDOSCOPY;  Service: Gastroenterology;  Laterality: N/A;  . HOT HEMOSTASIS N/A 12/28/2014   Procedure: HOT HEMOSTASIS (ARGON PLASMA COAGULATION/BICAP);  Surgeon: Inda Castle, MD;  Location: [facility] ENDOSCOPY;  Service: Endoscopy;  Laterality: N/A;  . LEFT HEART CATHETERIZATION WITH  CORONARY ANGIOGRAM N/A 04/23/2012   Procedure: LEFT HEART CATHETERIZATION WITH CORONARY ANGIOGRAM;  Surgeon: Larey Dresser, MD;  Location: [facility] CATH LAB;  Service: Cardiovascular;  Laterality: N/A;  . TONSILLECTOMY    . TUBAL LIGATION      reports that she has never smoked. She has never used smokeless tobacco. She reports that she does not drink alcohol or use drugs. family history includes Colon cancer in her brother; Prostate cancer in her brother; Sarcoidosis in her other and sister; Stomach cancer in her mother. Allergies  Allergen Reactions  . Doxycycline Diarrhea    Possible diarrhea  . Biaxin [Clarithromycin] Other (See Comments)    diarrhea  . Metronidazole Other (See Comments)    Pt had difficulty swallowing  this and says it was "horrible" to take. No allergic reaction.   Current Outpatient Medications on File Prior to Visit  Medication Sig Dispense Refill  . acetaminophen (TYLENOL) 500 MG tablet Take 2 tablets (1,000 mg total) by mouth 2 (two) times daily. 60 tablet 11  . atorvastatin (LIPITOR) 20 MG tablet TAKE 1 TABLET (20 MG TOTAL) BY MOUTH DAILY. 90 tablet 0  . ferrous sulfate 325 (65 FE) MG tablet Take 325 mg by mouth 2 (two) times daily with a meal.     . metoprolol succinate (TOPROL-XL) 25 MG 24 hr tablet Take 25 mg by mouth daily.    . Multiple Vitamin (MULTIVITAMIN) tablet Take 1 tablet by mouth daily.    . nitroGLYCERIN (NITROSTAT) 0.4 MG SL tablet Place 1 tablet (0.4 mg total) under the tongue every 5 (five) minutes as needed for chest pain (maximum of 3 tablets). 25 tablet 2   No current facility-administered medications on file prior to visit.    Review of Systems  Constitutional: Negative for other unusual diaphoresis or sweats HENT: Negative for ear discharge or swelling Eyes: Negative for other worsening visual disturbances Respiratory: Negative for stridor or other swelling  Gastrointestinal: Negative for worsening distension or other blood Genitourinary: Negative for retention or other urinary change Musculoskeletal: Negative for other MSK pain or swelling Skin: Negative for color change or other new lesions Neurological: Negative for worsening tremors and other numbness  Psychiatric/Behavioral: Negative for worsening agitation or other fatigue All other system neg per pt    Objective:   Physical Exam BP 112/86 (BP Location: Left Arm, Patient Position: Sitting, Cuff Size: Normal)   Pulse 88   Ht 5\' 4"  (1.626 m)   Wt 108 lb (49 kg)   SpO2 96%   BMI 18.54 kg/m  VS noted,  Constitutional: Pt appears in NAD HENT: Head: NCAT.  Right Ear: External ear normal.  Left Ear: External ear normal.  Eyes: . Pupils are equal, round, and reactive to light. Conjunctivae and EOM  are normal Nose: without d/c or deformity Neck: Neck supple. Gross normal ROM Cardiovascular: Normal rate and regular rhythm.   Pulmonary/Chest: Effort normal and breath sounds without rales or wheezing.  Abd:  Soft, NT, ND, + BS, no organomegaly Neurological: Pt is alert. At baseline orientation, motor grossly intact Skin: Skin is warm. No rashes, other new lesions, no LE edema Psychiatric: Pt behavior is normal without agitation  No other exam findings  Contains abnormal data POCT HgB A1C  Order: [id number]  Status:  Final result Visible to patient:  No (Not Released) Dx:  Hyperglycemia   Ref Range & Units 11:29 55mo ago 80mo ago 62yr ago  Hemoglobin A1C 4.0 - 5.6 % 5.9Abnormal   6.4 R,  CM 6.2 R, CM 5.8 R, CM           Assessment & Plan:

## 2017-09-23 NOTE — Assessment & Plan Note (Signed)
Lab Results  Component Value Date   HGBA1C 5.9 (A) 09/23/2017  stable overall by history and exam, recent data reviewed with pt, and pt to continue medical treatment as before,  to f/u any worsening symptoms or concerns

## 2017-09-23 NOTE — Assessment & Plan Note (Signed)
stable overall by history and exam, recent data reviewed with pt, and pt to continue medical treatment as before,  to f/u any worsening symptoms or concerns Lab Results  Component Value Date   CREATININE 1.01 04/01/2017

## 2017-09-23 NOTE — Assessment & Plan Note (Signed)
stable overall by history and exam, recent data reviewed with pt, and pt to continue medical treatment as before,  to f/u any worsening symptoms or concerns BP Readings from Last 3 Encounters:  09/23/17 112/86  08/31/17 110/88  05/26/17 (!) 147/87

## 2017-09-23 NOTE — Assessment & Plan Note (Signed)
Lab Results  Component Value Date   LDLCALC 83 04/01/2017  stable overall by history and exam, recent data reviewed with pt, and pt to continue medical treatment as before,  to f/u any worsening symptoms or concerns

## 2017-09-23 NOTE — Patient Instructions (Signed)
Your A1c was OK today  Please continue all other medications as before, and refills have been done if requested.  Please have the pharmacy call with any other refills you may need.  Please continue your efforts at being more active, low cholesterol diet, and weight control.  You are otherwise up to date with prevention measures today.  Please keep your appointments with your specialists as you may have planned  Please return in 6 months, or sooner if needed 

## 2017-10-08 ENCOUNTER — Telehealth: Payer: Self-pay | Admitting: Gastroenterology

## 2017-10-08 MED ORDER — FERROUS SULFATE 325 (65 FE) MG PO TABS: 325.0000 mg | ORAL_TABLET | Freq: Two times a day (BID) | ORAL | 1 refills | Status: DC

## 2017-10-08 NOTE — Telephone Encounter (Signed)
Per Caryl Pina at patient's pharmacy, patient is confused. She did not understand that when she was told they would have to order ferrous sulfate to come tomorrow, that it meant they could get the prescription, she thought it meant she couldn't get it at all. However, Caryl Pina thinks patient may be able to get ferrous sulfate for free if we sent as prescription. I have sent a prescription to her pharmacy.

## 2017-10-28 ENCOUNTER — Other Ambulatory Visit: Payer: Self-pay | Admitting: Internal Medicine

## 2017-11-06 ENCOUNTER — Other Ambulatory Visit: Payer: Self-pay | Admitting: Gastroenterology

## 2017-12-01 ENCOUNTER — Other Ambulatory Visit: Payer: Self-pay | Admitting: Gastroenterology

## 2017-12-16 ENCOUNTER — Other Ambulatory Visit: Payer: Self-pay | Admitting: Internal Medicine

## 2017-12-29 ENCOUNTER — Other Ambulatory Visit: Payer: Self-pay

## 2017-12-29 ENCOUNTER — Other Ambulatory Visit: Payer: Self-pay | Admitting: Gastroenterology

## 2017-12-29 MED ORDER — NITROGLYCERIN 0.4 MG SL SUBL: 0.4000 mg | SUBLINGUAL_TABLET | SUBLINGUAL | 2 refills | Status: DC | PRN

## 2017-12-29 NOTE — Telephone Encounter (Signed)
Pt last seen 12-2016. Labs done in April 2019. If you want her to stay on Ferrous

## 2017-12-29 NOTE — Telephone Encounter (Signed)
Dr. Havery Moros - if you want her to stay on ferrous sulfate can we give her more refills?  Thank you

## 2017-12-29 NOTE — Telephone Encounter (Signed)
Yes that's fine, we can refill it. It's been about a year since I have seen her. May be good to see her back in the office for a routine follow up in the upcoming months, at her convenience. Thanks

## 2018-01-07 IMAGING — DX DG TOE 4TH 2+V*L*
3 series · 3 of 3 positions shown · non-contrast
Comparison: None in PACs

CLINICAL DATA: Direct trauma to the fourth toe ulnar piece of
furniture 2 days ago. Persistent pain.

EXAM:
LEFT FOURTH TOE

[toe ap]
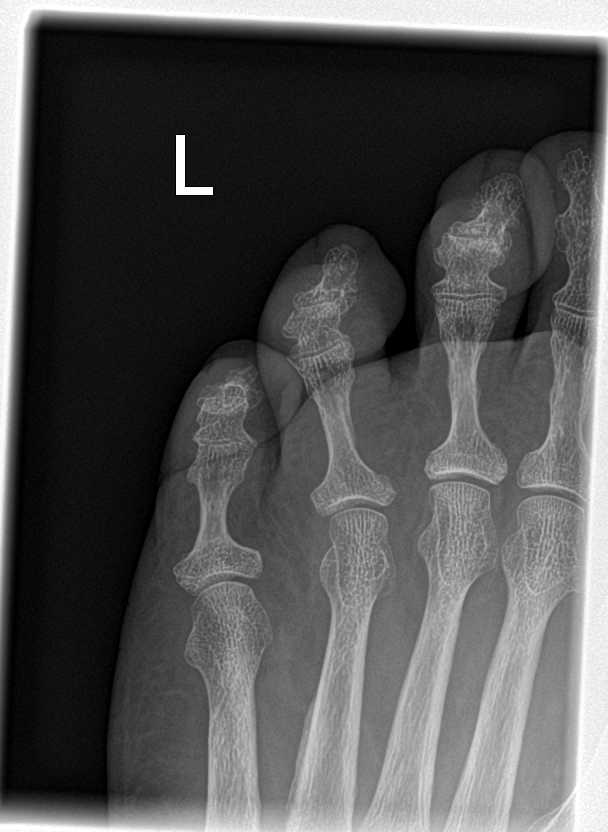

[toe obl]
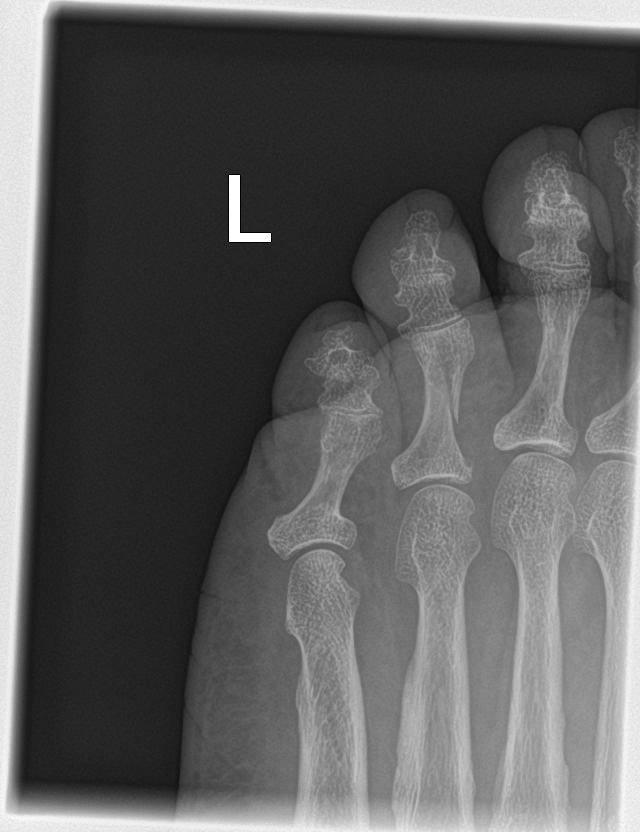

[toe lat]
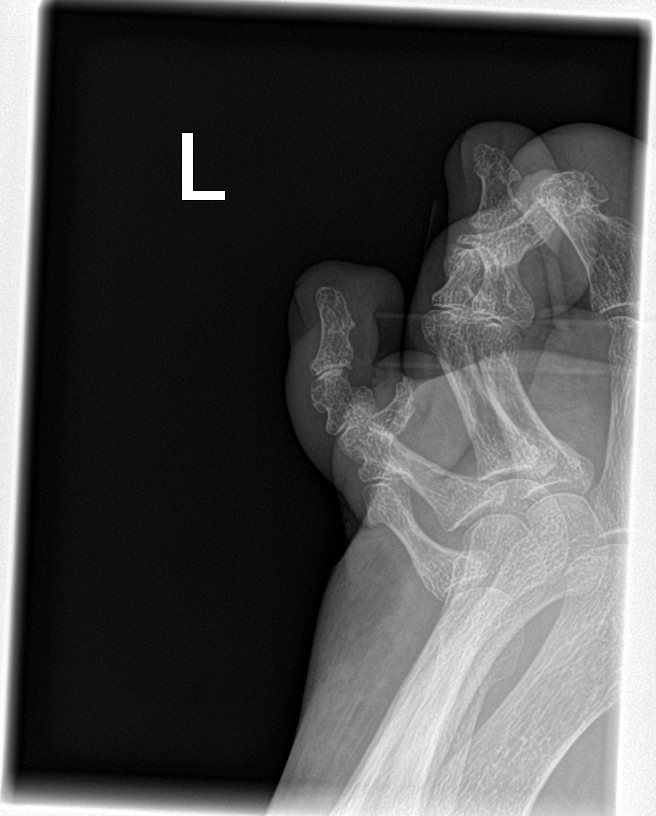

[3 of 3 positions shown; findings below may reference images not displayed]

FINDINGS: The patient has sustained an acute spiral fracture of the shaft of
the proximal phalanx of the fourth toe. There is old deformity of
the medial aspect of the base of the proximal phalanx due to a
previous fracture in December 2009. The third and fifth toes appear
intact.
IMPRESSION: The patient has sustained an acute minimally displaced spiral
fracture of the shaft of the proximal phalanx of the left fourth
toe.

## 2018-01-08 ENCOUNTER — Telehealth: Payer: Self-pay | Admitting: Gastroenterology

## 2018-01-08 ENCOUNTER — Ambulatory Visit (INDEPENDENT_AMBULATORY_CARE_PROVIDER_SITE_OTHER): Payer: Medicare Other

## 2018-01-08 ENCOUNTER — Other Ambulatory Visit: Payer: Self-pay

## 2018-01-08 DIAGNOSIS — D649 Anemia, unspecified: Secondary | ICD-10-CM

## 2018-01-08 DIAGNOSIS — Z23 Encounter for immunization: Secondary | ICD-10-CM | POA: Diagnosis not present

## 2018-01-08 NOTE — Telephone Encounter (Signed)
Reviewed last lab results and patient is due now for a 6 month recheck on CBC. Left voice message for patient to come in at her convenience, order in [place].

## 2018-01-14 ENCOUNTER — Other Ambulatory Visit (INDEPENDENT_AMBULATORY_CARE_PROVIDER_SITE_OTHER): Payer: Medicare Other

## 2018-01-14 DIAGNOSIS — D649 Anemia, unspecified: Secondary | ICD-10-CM

## 2018-01-14 LAB — CBC WITH DIFFERENTIAL/PLATELET
BASOS PCT: 0.6 % (ref 0.0–3.0)
Basophils Absolute: 0 10*3/uL (ref 0.0–0.1)
EOS ABS: 0.4 10*3/uL (ref 0.0–0.7)
Eosinophils Relative: 5.7 % — ABNORMAL HIGH (ref 0.0–5.0)
HEMATOCRIT: 34.2 % — AB (ref 36.0–46.0)
HEMOGLOBIN: 11.4 g/dL — AB (ref 12.0–15.0)
Lymphocytes Relative: 45 % (ref 12.0–46.0)
Lymphs Abs: 3.4 10*3/uL (ref 0.7–4.0)
MCHC: 33.5 g/dL (ref 30.0–36.0)
MCV: 96.2 fl (ref 78.0–100.0)
Monocytes Absolute: 0.8 10*3/uL (ref 0.1–1.0)
Monocytes Relative: 11 % (ref 3.0–12.0)
Neutro Abs: 2.9 10*3/uL (ref 1.4–7.7)
Neutrophils Relative %: 37.7 % — ABNORMAL LOW (ref 43.0–77.0)
Platelets: 285 10*3/uL (ref 150.0–400.0)
RBC: 3.56 Mil/uL — AB (ref 3.87–5.11)
RDW: 14.3 % (ref 11.5–15.5)
WBC: 7.6 10*3/uL (ref 4.0–10.5)

## 2018-02-03 ENCOUNTER — Other Ambulatory Visit: Payer: Self-pay | Admitting: Internal Medicine

## 2018-03-12 ENCOUNTER — Ambulatory Visit: Payer: Medicare Other | Admitting: Internal Medicine

## 2018-03-12 ENCOUNTER — Encounter: Payer: Self-pay | Admitting: Internal Medicine

## 2018-03-12 ENCOUNTER — Ambulatory Visit (INDEPENDENT_AMBULATORY_CARE_PROVIDER_SITE_OTHER): Payer: Medicare Other | Admitting: Internal Medicine

## 2018-03-12 VITALS — BP 146/94 | HR 88 | Temp 98.4°F | Ht 64.0 in | Wt 109.0 lb

## 2018-03-12 DIAGNOSIS — R05 Cough: Secondary | ICD-10-CM | POA: Diagnosis not present

## 2018-03-12 DIAGNOSIS — R739 Hyperglycemia, unspecified: Secondary | ICD-10-CM

## 2018-03-12 DIAGNOSIS — R059 Cough, unspecified: Secondary | ICD-10-CM

## 2018-03-12 DIAGNOSIS — I2510 Atherosclerotic heart disease of native coronary artery without angina pectoris: Secondary | ICD-10-CM

## 2018-03-12 DIAGNOSIS — I10 Essential (primary) hypertension: Secondary | ICD-10-CM | POA: Diagnosis not present

## 2018-03-12 MED ORDER — HYDROCODONE-HOMATROPINE 5-1.5 MG/5ML PO SYRP: 5.0000 mL | ORAL_SOLUTION | Freq: Four times a day (QID) | ORAL | 0 refills | Status: AC | PRN

## 2018-03-12 MED ORDER — LEVOFLOXACIN 250 MG PO TABS: 250.0000 mg | ORAL_TABLET | Freq: Every day | ORAL | 0 refills | Status: AC

## 2018-03-12 NOTE — Patient Instructions (Addendum)
Please take all new medication as prescribed - the antibiotic, and cough medicine as needed  You can also take Mucinex (or it's generic off brand) for congestion, and tylenol as needed for pain.  Please continue all other medications as before, and refills have been done if requested.  Please have the pharmacy call with any other refills you may need.  Please continue your efforts at being more active, low cholesterol diet, and weight control.  Please keep your appointments with your specialists as you may have planned  Please return in 6 months, or sooner if needed

## 2018-03-12 NOTE — Progress Notes (Signed)
Subjective:    Patient ID: Lindsay Doyle, female    DOB: Jun 19, 1933, 84 y.o.   MRN: [id number]  HPI  Here with acute onset mild to mod 2-3 days ST, HA, general weakness and malaise, with prod cough greenish sputum, but Pt denies chest pain, increased sob or doe, wheezing, orthopnea, PND, increased LE swelling, palpitations, dizziness or syncope.  Pt denies new neurological symptoms such as new headache, or facial or extremity weakness or numbness   Pt denies polydipsia, polyuria.   Past Medical History:  Diagnosis Date  . ANEMIA-IRON DEFICIENCY    presumed GI bleed 08/2012- anticoag stopped  . Asthma    per pt, she does not have asthma  . Atrial fibrillation ([place])    a. Apixaban started 03/2012 - stopped after admx for GI bleed 6/14  . Blood transfusion    a. 05/2010: 3 transfusions for anemia/GIB.  [name] CAD (coronary artery disease)    a. LHC 1/14:  mLAD serial 30 and 60%, oRCA 30%, EF 60%, Afib,   . Cataract   . COLONIC POLYPS, HX OF 03/09/2007  . COPD 03/09/2007   ? pt is unsure  . GERD (gastroesophageal reflux disease)   . GI bleed    thought to be due to AVMs  . Hx of echocardiogram    a. echo 2/11:  EF 55-60%, mild AI, mild RAE, mild to mod TR;   b.  a. Echo 2/14:  Mild focal basal and mild LVH, EF 60-65%, mild AI, mild RAE, trivia effusion  . HYPERGLYCEMIA 07/02/2007  . Hyperlipidemia   . Hypertension 11/12/2006  . LEUKOPENIA, MILD 05/30/2008  . Mitral valve prolapse    Remotely - last echo 2011 did not show this  . NSTEMI (non-ST elevated myocardial infarction) ([place]) 03/2012   a. Type II in setting of AF with RVR 03/2012  . OSTEOARTHRITIS 02/19/2009   Spinal  . OSTEOPOROSIS 11/12/2006  . Positive H. pylori test 10/1996  . PSVT    a. Remotely, in setting of anemia.  [name] Spinal stenosis of lumbar region 07/16/2012  . Transfusion history    7/15    Past Surgical History:  Procedure Laterality Date  . ABDOMINAL HYSTERECTOMY    . CARDIAC CATHETERIZATION  1992   S/P in 1992 at [facility] in [place]. This was negative for any coronary artery disease  . carotid duplex  08/29/2003  . CATARACT EXTRACTION    . ceasarean    . ENTEROSCOPY N/A 10/10/2013   Procedure: ENTEROSCOPY;  Surgeon: Inda Castle, MD;  Location: [facility] ENDOSCOPY;  Service: Endoscopy;  Laterality: N/A;  . ENTEROSCOPY N/A 12/28/2014   Procedure: ENTEROSCOPY;  Surgeon: Inda Castle, MD;  Location: [facility] ENDOSCOPY;  Service: Endoscopy;  Laterality: N/A;  . ENTEROSCOPY N/A 01/20/2016   Procedure: ENTEROSCOPY;  Surgeon: Manus Gunning, MD;  Location: [facility] ENDOSCOPY;  Service: Gastroenterology;  Laterality: N/A;  . HOT HEMOSTASIS N/A 12/28/2014   Procedure: HOT HEMOSTASIS (ARGON PLASMA COAGULATION/BICAP);  Surgeon: Inda Castle, MD;  Location: [facility];  Service: Endoscopy;  Laterality: N/A;  . LEFT HEART CATHETERIZATION WITH CORONARY ANGIOGRAM N/A 04/23/2012   Procedure: LEFT HEART CATHETERIZATION WITH CORONARY ANGIOGRAM;  Surgeon: Larey Dresser, MD;  Location: [facility] CATH LAB;  Service: Cardiovascular;  Laterality: N/A;  . TONSILLECTOMY    . TUBAL LIGATION      reports that she has never smoked. She has never used smokeless tobacco. She reports that she does not drink  alcohol or use drugs. family history includes Colon cancer in her brother; Prostate cancer in her brother; Sarcoidosis in her sister and another family member; Stomach cancer in her mother. Allergies  Allergen Reactions  . Doxycycline Diarrhea    Possible diarrhea  . Biaxin [Clarithromycin] Other (See Comments)    diarrhea  . Metronidazole Other (See Comments)    Pt had difficulty swallowing this and says it was "horrible" to take. No allergic reaction.   Current Outpatient Medications on File Prior to Visit  Medication Sig Dispense Refill  . acetaminophen (TYLENOL) 500 MG tablet Take 2 tablets (1,000 mg total) by mouth 2 (two) times daily. 60 tablet 11  . atorvastatin (LIPITOR) 20 MG tablet TAKE 1 TABLET (20  MG TOTAL) BY MOUTH DAILY. 90 tablet 0  . ferrous sulfate 325 (65 FE) MG tablet TAKE 1 TABLET (325 MG TOTAL) BY MOUTH 2 (TWO) TIMES DAILY WITH A MEAL. 60 tablet 3  . metoprolol succinate (TOPROL-XL) 25 MG 24 hr tablet Take 25 mg by mouth daily.    . metoprolol succinate (TOPROL-XL) 25 MG 24 hr tablet TAKE 1 TABLET EVERY DAY 90 tablet 0  . Multiple Vitamin (MULTIVITAMIN) tablet Take 1 tablet by mouth daily.    . nitroGLYCERIN (NITROSTAT) 0.4 MG SL tablet Place 1 tablet (0.4 mg total) under the tongue every 5 (five) minutes as needed for chest pain (maximum of 3 tablets). 25 tablet 2   No current facility-administered medications on file prior to visit.    Review of Systems  Constitutional: Negative for other unusual diaphoresis or sweats HENT: Negative for ear discharge or swelling Eyes: Negative for other worsening visual disturbances Respiratory: Negative for stridor or other swelling  Gastrointestinal: Negative for worsening distension or other blood Genitourinary: Negative for retention or other urinary change Musculoskeletal: Negative for other MSK pain or swelling Skin: Negative for color change or other new lesions Neurological: Negative for worsening tremors and other numbness  Psychiatric/Behavioral: Negative for worsening agitation or other fatigue All other system neg per pt    Objective:   Physical Exam BP (!) 146/94   Pulse 88   Temp 98.4 F (36.9 C) (Oral)   Ht 5\' 4"  (1.626 m)   Wt 109 lb (49.4 kg)   SpO2 97%   BMI 18.71 kg/m  VS noted, mild ill Constitutional: Pt appears in NAD HENT: Head: NCAT.  Right Ear: External ear normal.  Left Ear: External ear normal.  Eyes: . Pupils are equal, round, and reactive to light. Conjunctivae and EOM are normal Nose: without d/c or deformity Neck: Neck supple. Gross normal ROM Cardiovascular: Normal rate and regular rhythm.   Pulmonary/Chest: Effort normal and breath sounds decreased without rales or wheezing.  Neurological:  Pt is alert. At baseline orientation, motor grossly intact Skin: Skin is warm. No rashes, other new lesions, no LE edema Psychiatric: Pt behavior is normal without agitation  No other exam findings  Declines labs today; Lab Results  Component Value Date   WBC 7.6 01/14/2018   HGB 11.4 (L) 01/14/2018   HCT 34.2 (L) 01/14/2018   PLT 285.0 01/14/2018   GLUCOSE 63 (L) 04/01/2017   CHOL 166 04/01/2017   TRIG 107.0 04/01/2017   HDL 62.00 04/01/2017   LDLDIRECT 223.7 01/08/2012   LDLCALC 83 04/01/2017   ALT 13 09/25/2016   AST 17 09/25/2016   NA 140 04/01/2017   K 5.5 (H) 04/01/2017   CL 103 04/01/2017   CREATININE 1.01 04/01/2017   BUN 15  04/01/2017   CO2 27 04/01/2017   TSH 1.24 09/25/2016   INR 1.06 01/20/2016   HGBA1C 5.9 (A) 09/23/2017       Assessment & Plan:

## 2018-03-13 NOTE — Assessment & Plan Note (Signed)
stable overall by history and exam, recent data reviewed with pt, and pt to continue medical treatment as before,  to f/u any worsening symptoms or concerns  

## 2018-03-13 NOTE — Assessment & Plan Note (Signed)
Mild to mod, c/w bronchitis vs pna, declines cxr, for antibx course, cough med prn,  to f/u any worsening symptoms or concerns 

## 2018-03-15 ENCOUNTER — Ambulatory Visit: Payer: Self-pay

## 2018-03-15 ENCOUNTER — Telehealth: Payer: Self-pay | Admitting: Internal Medicine

## 2018-03-15 NOTE — Telephone Encounter (Signed)
Dr John please advise.  

## 2018-03-15 NOTE — Telephone Encounter (Signed)
No, this would all be ok take all together and is done by many other persons every day with the same medications; the potential side effects are very uncommon and written by lawyers

## 2018-03-15 NOTE — Telephone Encounter (Signed)
Copied from [phone]. Topic: Quick Communication - Rx Refill/Question >> Mar 15, 2018  8:10 AM Alanda Slim E wrote: Medication: levofloxacin (LEVAQUIN) 250 MG tablet and  HYDROcodone-homatropine (HYCODAN) 5-1.5 MG/5ML syrup  Pt stated the pharmacy gave her papers to read when she picked up the ABOVE medications. In the papers it stated that if she has a heart condition, arthritis, are a bleeder or other conditions she she not take these medications and to separate taking Hydrocodone from her iron medicine. Pt is concerned she should not be taking these medications. Pt asked if she can be prescribed amoxicillin if she needs an antibiotic(she has taken this before). Pt has been taking tylenol and Robitussin over the weekend. / please advise   Has the patient contacted their pharmacy? Yes.    Preferred Pharmacy (with phone number or street name): [facility], [address] [phone] (Phone) [phone] (Fax)    Agent: Please be advised that RX refills may take up to 3 business days. We ask that you follow-up with your pharmacy.

## 2018-03-15 NOTE — Telephone Encounter (Signed)
Medication: levofloxacin (LEVAQUIN) 250 MG tablet and  HYDROcodone-homatropine (HYCODAN) 5-1.5 MG/5ML syrup  Pt stated the pharmacy gave her papers to read when she picked up the ABOVE medications. In the papers it stated that if she has a heart condition, arthritis, are a bleeder or other conditions she she not take these medications and to separate taking Hydrocodone from her iron medicine. Pt is very concerned she should not be taking these medications. Pt asked if she can be prescribed amoxicillin if she needs an antibiotic(she has taken this before). Pt has been taking tylenol and Robitussin over the weekend. / please advise. Medication: levofloxacin (LEVAQUIN) 250 MG tablet and  HYDROcodone-homatropine (HYCODAN) 5-1.5 MG/5ML syrup. Pt has been taking Tylenol and Robitussin DM over the weekend. Pt advised to avoid any products that have DM in the name due to h/o HTN.  See telephone note from today. Has already been routed to Dr Jenny Reichmann.   Reason for Disposition . [1] Caller requesting NON-URGENT health information AND [2] PCP's office is the best resource  Answer Assessment - Initial Assessment Questions 1. REASON FOR CALL or QUESTION: "What is your reason for calling today?" or "How can I best help you?" or "What question do you have that I can help answer?"     Medication: levofloxacin (LEVAQUIN) 250 MG tablet and  HYDROcodone-homatropine (HYCODAN) 5-1.5 MG/5ML syrup  Pt stated the pharmacy gave her papers to read when she picked up the ABOVE medications. In the papers it stated that if she has a heart condition, arthritis, are a bleeder or other conditions she she not take these medications and to separate taking Hydrocodone from her iron medicine. Pt is very concerned she should not be taking these medications. Pt asked if she can be prescribed amoxicillin if she needs an antibiotic(she has taken this before). Pt has been taking tylenol and Robitussin over the weekend. / please advise.   Medication: levofloxacin (LEVAQUIN) 250 MG tablet and  HYDROcodone-homatropine (HYCODAN) 5-1.5 MG/5ML syrup  .  Protocols used: INFORMATION ONLY CALL-A-AH

## 2018-03-16 NOTE — Telephone Encounter (Signed)
Pt called back.  Informed her of Dr. Gwynn Burly answer below.

## 2018-03-16 NOTE — Telephone Encounter (Signed)
Noted  

## 2018-03-18 ENCOUNTER — Other Ambulatory Visit: Payer: Self-pay | Admitting: Internal Medicine

## 2018-03-29 ENCOUNTER — Ambulatory Visit: Payer: Self-pay

## 2018-03-29 NOTE — Telephone Encounter (Signed)
Patient called and says that she is coughing up clear mucus and wants to know if that is normal. She says she was on an Levaquin and taking Hycodan for cough. She wasn't coughing up anything when she came in the office on 12/13. She says she started taking the medicines on 12/15. She's done with the antibiotic and now is coughing up a clear, white phlegm. She denies fever, SOB, CP. She asks if she can take something OTC for the cough. She says the cough is worse in the morning after she gets up, starts moving around and drinking water. Then it subsides and she may cough periodically throughout the day, but not at night. I advised that the antibiotic has loosened up congestion in the lungs and that coughing it up is good, to prevent pneumonia. I advised that as long as it's clear, that is ok, but if the color changes, cough increases, fever develops, call back to the office for an appointment or go to the ED or UC, advised to take Robitussin without the DM for cough, she verbalized understanding.  Reason for Disposition . Cough with cold symptoms (e.g., runny nose, postnasal drip, throat clearing)  Answer Assessment - Initial Assessment Questions 1. ONSET: "When did the cough begin?"      Before I came in the office on 03/06/18 2. SEVERITY: "How bad is the cough today?"      Not bad today. It 3. RESPIRATORY DISTRESS: "Describe your breathing."      Breathing fine 4. FEVER: "Do you have a fever?" If so, ask: "What is your temperature, how was it measured, and when did it start?"     No 5. SPUTUM: "Describe the color of your sputum" (clear, white, yellow, green)     Clear 6. HEMOPTYSIS: "Are you coughing up any blood?" If so ask: "How much?" (flecks, streaks, tablespoons, etc.)     No 7. CARDIAC HISTORY: "Do you have any history of heart disease?" (e.g., heart attack, congestive heart failure)      MVP 8. LUNG HISTORY: "Do you have any history of lung disease?"  (e.g., pulmonary embolus, asthma,  emphysema)     No 9. PE RISK FACTORS: "Do you have a history of blood clots?" (or: recent major surgery, recent prolonged travel, bedridden)     No 10. OTHER SYMPTOMS: "Do you have any other symptoms?" (e.g., runny nose, wheezing, chest pain)       No 11. PREGNANCY: "Is there any chance you are pregnant?" "When was your last menstrual period?"       No 12. TRAVEL: "Have you traveled out of the country in the last month?" (e.g., travel history, exposures)       No  Protocols used: [place]

## 2018-03-30 NOTE — Telephone Encounter (Signed)
Patient scheduled an appointment to be seen on Thursday by Almyra Free. Refused to go to urgent care or to be seen at another office.

## 2018-03-30 NOTE — Telephone Encounter (Signed)
Pt. Calling and asking can her provider order a chest x-ray for her. I instructed her that she would need an OV and be evaluated. No availability at [facility] today, offered an appointment at another location. Refused. Insists that Dr. Jenny Reichmann order a chest x-ray. Please advise pt.

## 2018-03-30 NOTE — Telephone Encounter (Signed)
I agree with RN, pt needs an OV for evaluation to have documentation for x-ray reasoning. If she doesn't want to be seen by another [facility] provider she can be seen at urgent care or ED at her discretion.

## 2018-04-01 ENCOUNTER — Ambulatory Visit (INDEPENDENT_AMBULATORY_CARE_PROVIDER_SITE_OTHER): Payer: Medicare Other | Admitting: Family Medicine

## 2018-04-01 VITALS — BP 152/88 | HR 98 | Temp 97.8°F | Resp 16 | Wt 106.0 lb

## 2018-04-01 DIAGNOSIS — J40 Bronchitis, not specified as acute or chronic: Secondary | ICD-10-CM | POA: Diagnosis not present

## 2018-04-01 DIAGNOSIS — R05 Cough: Secondary | ICD-10-CM

## 2018-04-01 DIAGNOSIS — R059 Cough, unspecified: Secondary | ICD-10-CM

## 2018-04-01 NOTE — Patient Instructions (Signed)
It was a pleasure to meet you today!  I am glad to hear that you are feeling better and that your bronchitis is improving.  Please use cough syrup for symptoms as needed.  If no improvement or worsening symptoms such as fever or shortness of breath, please follow up with Dr. Jenny Reichmann for further evaluation.    Acute Bronchitis, Adult Acute bronchitis is when air tubes (bronchi) in the lungs suddenly get swollen. The condition can make it hard to breathe. It can also cause these symptoms:  A cough.  Coughing up clear, yellow, or green mucus.  Wheezing.  Chest congestion.  Shortness of breath.  A fever.  Body aches.  Chills.  A sore throat. Follow these instructions at home:  Medicines  Take over-the-counter and prescription medicines only as told by your doctor.  If you were prescribed an antibiotic medicine, take it as told by your doctor. Do not stop taking the antibiotic even if you start to feel better. General instructions  Rest.  Drink enough fluids to keep your pee (urine) pale yellow.  Avoid smoking and secondhand smoke. If you smoke and you need help quitting, ask your doctor. Quitting will help your lungs heal faster.  Use an inhaler, cool mist vaporizer, or humidifier as told by your doctor.  Keep all follow-up visits as told by your doctor. This is important. How is this prevented? To lower your risk of getting this condition again:  Wash your hands often with soap and water. If you cannot use soap and water, use hand sanitizer.  Avoid contact with people who have cold symptoms.  Try not to touch your hands to your mouth, nose, or eyes.  Make sure to get the flu shot every year. Contact a doctor if:  Your symptoms do not get better in 2 weeks. Get help right away if:  You cough up blood.  You have chest pain.  You have very bad shortness of breath.  You become dehydrated.  You faint (pass out) or keep feeling like you are going to pass  out.  You keep throwing up (vomiting).  You have a very bad headache.  Your fever or chills gets worse. This information is not intended to replace advice given to you by your health care provider. Make sure you discuss any questions you have with your health care provider. Document Released: 09/03/2007 Document Revised: 10/29/2016 Document Reviewed: 09/05/2015 Elsevier Interactive Patient Education  2019 [employer].

## 2018-04-01 NOTE — Progress Notes (Signed)
Patient ID: Lindsay Doyle, female   DOB: [birth year], 85 y.o.   MRN: [id number]  PCP: Biagio Borg, MD  Subjective:  Lindsay Doyle. Lindsay Doyle is a 85 y.o. year old very pleasant female patient who presents with  symptoms including nasal congestion that has improved, cough that is productive of white mucous intermittently and this has improved  - does not have wheeze as well -started: 03/12/18, symptoms are improving -previous treatments: Levofloxacin has provided benefit.  -sick contacts/travel/risks: denies flu exposure. No recent sick contact exposure Influenza vaccine is UTD She was evaluated and treated for bronchitis on 03/12/18. She completed levofloxacin   ROS-denies fever, NVD, tooth pain. Denies significant shortness of breath.   Pertinent Past Medical History- COPD, Asthma, allergic rhinitis, Chronic diastolic heart failure, HTN, and CKD stage 4 Patient Active Problem List   Diagnosis Date Noted  . Toe pain, left 12/03/2016  . Dysuria 09/27/2016  . High ankle sprain 09/08/2016  . Constipation 03/10/2016  . Finger injury 03/04/2016  . Chronic diastolic heart failure ([place]) 01/21/2016  . AVM (arteriovenous malformation) of small bowel, acquired with hemorrhage   . Upper GI bleed   . Eyelid abnormality 11/30/2015  . Osteoarthritis, hand 10/05/2015  . Rash 03/29/2015  . Atrial fibrillation ([place]) 02/02/2015  . Fibromyalgia 01/25/2015  . Myalgia 12/08/2014  . Helicobacter pylori ab+ 05/19/2014  . Dehydration   . Enteritis   . Abdominal pain, generalized   . Malnutrition of moderate degree ([place]) 05/10/2014  . Chronic hyponatremia 05/09/2014  . Gastroenteritis 05/09/2014  . Guaiac positive stools 05/08/2014  . Tachycardia 05/08/2014  . Occult blood positive stool 05/08/2014  . AKI (acute kidney injury) ([place]) 10/21/2013  . Acute on chronic renal failure ([place]) 10/21/2013  . UTI (lower urinary tract infection) 10/20/2013  . ARF (acute renal failure) ([place]) 10/20/2013  . Gastric AVM  10/10/2013  . Acute blood loss anemia 09/23/2013  . Dark stools 09/21/2013  . Anxiety state 04/20/2013  . Cervical spine arthritis 03/11/2013  . Neck pain on left side 02/15/2013  . Lump in neck 09/23/2012  . Symptomatic anemia 09/17/2012  . Other malaise and fatigue 09/14/2012  . Dizziness 09/14/2012  . Leg cramp 09/14/2012  . Spinal stenosis of lumbar region 07/16/2012  . Osteoporosis 04/27/2012  . Coronary Artery Disease 04/24/2012  . NSTEMI (non-ST elevated myocardial infarction) ([place]) 04/23/2012  . Preventative health care 01/08/2012  . Diarrhea 10/07/2011  . CKD (chronic kidney disease) stage 4, GFR 15-29 ml/min ([facility]) 09/30/2011  . Unspecified disorder of liver 10/22/2010  . Menopausal state 10/22/2010  . Schatzki's ring 10/22/2010  . Hip pain, left 07/23/2010  . PSVT 05/03/2009  . OSTEOARTHRITIS 02/19/2009  . ASYMPTOMATIC POSTMENOPAUSAL STATUS 07/17/2008  . LEUKOPENIA, MILD 05/30/2008  . SKIN RASH 07/02/2007  . Hyperglycemia 07/02/2007  . Hyperlipidemia 03/09/2007  . Iron deficiency anemia 03/09/2007  . MITRAL VALVE PROLAPSE 03/09/2007  . Allergic rhinitis 03/09/2007  . COPD (chronic obstructive pulmonary disease) ([place]) 03/09/2007  . History of colonic polyps 03/09/2007  . Asthma 01/18/2007  . Cough 01/18/2007  . Essential hypertension 11/12/2006    Medications- reviewed  Current Outpatient Medications  Medication Sig Dispense Refill  . acetaminophen (TYLENOL) 500 MG tablet Take 2 tablets (1,000 mg total) by mouth 2 (two) times daily. 60 tablet 11  . atorvastatin (LIPITOR) 20 MG tablet TAKE 1 TABLET (20 MG TOTAL) BY MOUTH DAILY. 90 tablet 0  . ferrous sulfate 325 (65 FE) MG tablet TAKE 1 TABLET (325 MG TOTAL) BY MOUTH  2 (TWO) TIMES DAILY WITH A MEAL. 60 tablet 3  . metoprolol succinate (TOPROL-XL) 25 MG 24 hr tablet Take 25 mg by mouth daily.    . metoprolol succinate (TOPROL-XL) 25 MG 24 hr tablet TAKE 1 TABLET EVERY DAY 90 tablet 0  . Multiple Vitamin  (MULTIVITAMIN) tablet Take 1 tablet by mouth daily.    . nitroGLYCERIN (NITROSTAT) 0.4 MG SL tablet Place 1 tablet (0.4 mg total) under the tongue every 5 (five) minutes as needed for chest pain (maximum of 3 tablets). 25 tablet 2   No current facility-administered medications for this visit.     Objective: BP (!) 152/88   Pulse 98   Temp 97.8 F (36.6 C) (Oral)   Resp 16   Wt 106 lb (48.1 kg)   SpO2 96%   BMI 18.19 kg/m  Retake of HR 90 Gen: NAD, resting comfortably HEENT: Turbinates mildly erythematous, TMs normal bilaterally,oropharynx is clear and moist, no sinus tenderness CV: Normal rate and regular rhythm Lungs: CTAB no crackles, wheeze, rhonchi  Ext: no edema Skin: warm, dry, no rash  Assessment/Plan: 1. Bronchitis Patient's symptoms most consistent with bronchitis that is improving. She completed levaquin as prescribed and reports improvement. She did not cough during encounter today and reports feeling better. We discussed that lung exam was benign and VSS and with improving symptoms, she will use guaifenesin as needed for cough. Advised that if her symptoms were to worsen she should let us know. Return precautions provided.    2. Cough Guaifenesin as needed.   We discussed that we did not find any infection that had higher probability of being bacterial such as pneumonia, strep throat, ear infection, bacterial sinusitis. We discussed signs that bacterial infection may have developed particularly fever or shortness of breath and reasons for follow up (symptoms worsen, last past expected time frame, new concerns arise).  Likely course of 3-6 weeks.   [name], FNP

## 2018-04-05 ENCOUNTER — Telehealth: Payer: Self-pay | Admitting: Gastroenterology

## 2018-04-05 DIAGNOSIS — D649 Anemia, unspecified: Secondary | ICD-10-CM

## 2018-04-05 NOTE — Telephone Encounter (Signed)
patient

## 2018-04-05 NOTE — Telephone Encounter (Signed)
Pt needs toknow when she is due for labs again. Pls call her.

## 2018-04-05 NOTE — Telephone Encounter (Signed)
Patient advised she is due for CBC in mid April

## 2018-04-20 ENCOUNTER — Other Ambulatory Visit (INDEPENDENT_AMBULATORY_CARE_PROVIDER_SITE_OTHER): Payer: Medicare Other

## 2018-04-20 DIAGNOSIS — D649 Anemia, unspecified: Secondary | ICD-10-CM

## 2018-04-20 LAB — CBC WITH DIFFERENTIAL/PLATELET
Basophils Absolute: 0.1 10*3/uL (ref 0.0–0.1)
Basophils Relative: 1.2 % (ref 0.0–3.0)
EOS ABS: 0.3 10*3/uL (ref 0.0–0.7)
EOS PCT: 3.5 % (ref 0.0–5.0)
HCT: 34.9 % — ABNORMAL LOW (ref 36.0–46.0)
HEMOGLOBIN: 11.6 g/dL — AB (ref 12.0–15.0)
Lymphocytes Relative: 45.7 % (ref 12.0–46.0)
Lymphs Abs: 3.3 10*3/uL (ref 0.7–4.0)
MCHC: 33.2 g/dL (ref 30.0–36.0)
MCV: 97 fl (ref 78.0–100.0)
MONO ABS: 0.9 10*3/uL (ref 0.1–1.0)
Monocytes Relative: 11.8 % (ref 3.0–12.0)
NEUTROS PCT: 37.8 % — AB (ref 43.0–77.0)
Neutro Abs: 2.7 10*3/uL (ref 1.4–7.7)
Platelets: 328 10*3/uL (ref 150.0–400.0)
RBC: 3.59 Mil/uL — ABNORMAL LOW (ref 3.87–5.11)
RDW: 14.3 % (ref 11.5–15.5)
WBC: 7.3 10*3/uL (ref 4.0–10.5)

## 2018-04-23 ENCOUNTER — Other Ambulatory Visit: Payer: Self-pay | Admitting: Internal Medicine

## 2018-04-28 ENCOUNTER — Ambulatory Visit (INDEPENDENT_AMBULATORY_CARE_PROVIDER_SITE_OTHER): Payer: Medicare Other | Admitting: Cardiovascular Disease

## 2018-04-28 ENCOUNTER — Encounter: Payer: Self-pay | Admitting: Cardiovascular Disease

## 2018-04-28 VITALS — BP 126/84 | HR 97 | Ht 64.0 in | Wt 108.4 lb

## 2018-04-28 DIAGNOSIS — I10 Essential (primary) hypertension: Secondary | ICD-10-CM

## 2018-04-28 DIAGNOSIS — I480 Paroxysmal atrial fibrillation: Secondary | ICD-10-CM

## 2018-04-28 MED ORDER — METOPROLOL SUCCINATE ER 25 MG PO TB24: 37.5000 mg | ORAL_TABLET | Freq: Every day | ORAL | 3 refills | Status: DC

## 2018-04-28 NOTE — Patient Instructions (Signed)
Medication Instructions:  1) INCREASE TOPROL to 37.5 mg (1.5 tablets) once daily  Labwork: None  Testing/Procedures: None  Follow-Up: Your provider wants you to follow-up in: 1 year with Dr. Burt Knack.  You will receive a reminder letter in the mail two months in advance. If you don't receive a letter, please call our office to schedule the follow-up appointment.

## 2018-04-28 NOTE — Progress Notes (Signed)
Cardiology Office Note:    Date:  04/28/2018   ID:  Lindsay Doyle, [name] 1933/04/20, MRN [id number]  PCP:  Biagio Borg, MD  Cardiologist:  Sherren Mocha, MD  Electrophysiologist:  None   Referring MD: Biagio Borg, MD   Chief Complaint  Patient presents with  . Palpitations    History of Present Illness:    Lindsay Doyle is a 85 y.o. female with a hx of paroxysmal atrial fibrillation, returning today for follow-up evaluation.  The patient initially presented with atrial fibrillation in 2014 with associated elevation of troponin.  She underwent cardiac catheterization demonstrating nonobstructive coronary artery disease.  She has been unable to tolerate anticoagulant drugs.  The patient is here alone today.  She is been under a lot of stress over the last few months because of the death of some close friends and family members.  She had an episode of tacky-palpitations at night about 5 weeks ago.  She took an extra metoprolol and these resolved on their own.  She has had no other heart palpitations or physical limitation.  She specifically denies chest pain or shortness of breath.  She continues to participate in an exercise class on a regular basis.  Past Medical History:  Diagnosis Date  . ANEMIA-IRON DEFICIENCY    presumed GI bleed 08/2012- anticoag stopped  . Asthma    per pt, she does not have asthma  . Atrial fibrillation ([place])    a. Apixaban started 03/2012 - stopped after admx for GI bleed 6/14  . Blood transfusion    a. 05/2010: 3 transfusions for anemia/GIB.  [name] CAD (coronary artery disease)    a. LHC 1/14:  mLAD serial 30 and 60%, oRCA 30%, EF 60%, Afib,   . Cataract   . COLONIC POLYPS, HX OF 03/09/2007  . COPD 03/09/2007   ? pt is unsure  . GERD (gastroesophageal reflux disease)   . GI bleed    thought to be due to AVMs  . Hx of echocardiogram    a. echo 2/11:  EF 55-60%, mild AI, mild RAE, mild to mod TR;   b.  a. Echo 2/14:  Mild focal basal and mild LVH, EF  60-65%, mild AI, mild RAE, trivia effusion  . HYPERGLYCEMIA 07/02/2007  . Hyperlipidemia   . Hypertension 11/12/2006  . LEUKOPENIA, MILD 05/30/2008  . Mitral valve prolapse    Remotely - last echo 2011 did not show this  . NSTEMI (non-ST elevated myocardial infarction) ([place]) 03/2012   a. Type II in setting of AF with RVR 03/2012  . OSTEOARTHRITIS 02/19/2009   Spinal  . OSTEOPOROSIS 11/12/2006  . Positive H. pylori test 10/1996  . PSVT    a. Remotely, in setting of anemia.  [name] Spinal stenosis of lumbar region 07/16/2012  . Transfusion history    7/15     Past Surgical History:  Procedure Laterality Date  . ABDOMINAL HYSTERECTOMY    . CARDIAC CATHETERIZATION  1992   S/P in 1992 at [facility] in [place]. This was negative for any coronary artery disease  . carotid duplex  08/29/2003  . CATARACT EXTRACTION    . ceasarean    . ENTEROSCOPY N/A 10/10/2013   Procedure: ENTEROSCOPY;  Surgeon: Inda Castle, MD;  Location: [facility] ENDOSCOPY;  Service: Endoscopy;  Laterality: N/A;  . ENTEROSCOPY N/A 12/28/2014   Procedure: ENTEROSCOPY;  Surgeon: Inda Castle, MD;  Location: [facility] ENDOSCOPY;  Service: Endoscopy;  Laterality: N/A;  .  ENTEROSCOPY N/A 01/20/2016   Procedure: ENTEROSCOPY;  Surgeon: Manus Gunning, MD;  Location: [facility] ENDOSCOPY;  Service: Gastroenterology;  Laterality: N/A;  . HOT HEMOSTASIS N/A 12/28/2014   Procedure: HOT HEMOSTASIS (ARGON PLASMA COAGULATION/BICAP);  Surgeon: Inda Castle, MD;  Location: [facility] ENDOSCOPY;  Service: Endoscopy;  Laterality: N/A;  . LEFT HEART CATHETERIZATION WITH CORONARY ANGIOGRAM N/A 04/23/2012   Procedure: LEFT HEART CATHETERIZATION WITH CORONARY ANGIOGRAM;  Surgeon: Larey Dresser, MD;  Location: [facility] CATH LAB;  Service: Cardiovascular;  Laterality: N/A;  . TONSILLECTOMY    . TUBAL LIGATION      Current Medications: Current Meds  Medication Sig  . acetaminophen (TYLENOL) 500 MG tablet Take 2 tablets (1,000 mg total) by mouth  2 (two) times daily.  [name] atorvastatin (LIPITOR) 20 MG tablet TAKE 1 TABLET (20 MG TOTAL) BY MOUTH DAILY.  . ferrous sulfate 325 (65 FE) MG tablet TAKE 1 TABLET (325 MG TOTAL) BY MOUTH 2 (TWO) TIMES DAILY WITH A MEAL.  . metoprolol succinate (TOPROL-XL) 25 MG 24 hr tablet Take 1.5 tablets (37.5 mg total) by mouth daily.  . Multiple Vitamin (MULTIVITAMIN) tablet Take 1 tablet by mouth daily.  . nitroGLYCERIN (NITROSTAT) 0.4 MG SL tablet Place 1 tablet (0.4 mg total) under the tongue every 5 (five) minutes as needed for chest pain (maximum of 3 tablets).  . [DISCONTINUED] metoprolol succinate (TOPROL-XL) 25 MG 24 hr tablet TAKE 1 TABLET EVERY DAY     Allergies:   Doxycycline; Biaxin [clarithromycin]; and Metronidazole   Social History   Socioeconomic History  . Marital status: Married    Spouse name: Not on file  . Number of children: 1  . Years of education: Not on file  . Highest education level: Not on file  Occupational History  . Occupation: retired    Comment: retired  Scientific laboratory technician  . Financial resource strain: Not hard at all  . Food insecurity:    Worry: Never true    Inability: Never true  . Transportation needs:    Medical: No    Non-medical: No  Tobacco Use  . Smoking status: Never Smoker  . Smokeless tobacco: Never Used  Substance and Sexual Activity  . Alcohol use: No  . Drug use: No  . Sexual activity: Never  Lifestyle  . Physical activity:    Days per week: 3 days    Minutes per session: 60 min  . Stress: Only a little  Relationships  . Social connections:    Talks on phone: More than three times a week    Gets together: More than three times a week    Attends religious service: More than 4 times per year    Active member of club or organization: Not on file    Attends meetings of clubs or organizations: More than 4 times per year    Relationship status: Married  Other Topics Concern  . Not on file  Social History Narrative  . Not on file     Family  History: The patient's family history includes Colon cancer in her brother; Prostate cancer in her brother; Sarcoidosis in her sister and another family member; Stomach cancer in her mother. There is no history of CAD.  ROS:   Please see the history of present illness.    Positive for cough.  All other systems reviewed and are negative.  EKGs/Labs/Other Studies Reviewed:    The following studies were reviewed today: Echo 01-20-2016: Study Conclusions  - Left ventricle: The cavity  size was normal. Wall thickness was   increased in a pattern of mild LVH. Systolic function was   vigorous. The estimated ejection fraction was in the range of 65%   to 70%. Wall motion was normal; there were no regional wall   motion abnormalities. Doppler parameters are consistent with   abnormal left ventricular relaxation (grade 1 diastolic   dysfunction). - Aortic valve: There was mild regurgitation. - Tricuspid valve: There was moderate regurgitation. - Pulmonary arteries: Systolic pressure was mildly increased. PA   peak pressure: 36 mm Hg (S). - Pericardium, extracardiac: A trivial pericardial effusion was   identified.  EKG:  EKG is ordered today.  The ekg ordered today demonstrates NSR 97 bpm, within normal limits  Recent Labs: 04/20/2018: Hemoglobin 11.6; Platelets 328.0  Recent Lipid Panel    Component Value Date/Time   CHOL 166 04/01/2017 0917   TRIG 107.0 04/01/2017 0917   HDL 62.00 04/01/2017 0917   CHOLHDL 3 04/01/2017 0917   VLDL 21.4 04/01/2017 0917   LDLCALC 83 04/01/2017 0917   LDLDIRECT 223.7 01/08/2012 0858    Physical Exam:    VS:  BP 126/84   Pulse 97   Ht 5\' 4"  (1.626 m)   Wt 108 lb 6.4 oz (49.2 kg)   SpO2 99%   BMI 18.61 kg/m     Wt Readings from Last 3 Encounters:  04/28/18 108 lb 6.4 oz (49.2 kg)  04/01/18 106 lb (48.1 kg)  03/12/18 109 lb (49.4 kg)     GEN:  Well nourished, well developed, thin elderly woman in no acute distress HEENT: Normal NECK: No  JVD; No carotid bruits LYMPHATICS: No lymphadenopathy CARDIAC: RRR, no murmurs, rubs, gallops RESPIRATORY:  Clear to auscultation without rales, wheezing or rhonchi  ABDOMEN: Soft, non-tender, non-distended MUSCULOSKELETAL:  No edema; No deformity  SKIN: Warm and dry NEUROLOGIC:  Alert and oriented x 3 PSYCHIATRIC:  Normal affect   ASSESSMENT:    1. PAF (paroxysmal atrial fibrillation) ([place])   2. Essential hypertension    PLAN:    In order of problems listed above:  1. The patient is maintaining sinus rhythm.  She is unable to tolerate any anticoagulant medication.  She was not interested in left atrial appendage occlusion based on previous discussions.  Resting heart rate is elevated.  I recommended increasing metoprolol succinate to 37.5 mg daily.  She will follow-up in 1 year or sooner if symptoms arise. 2. Blood pressure is well controlled on metoprolol succinate.     Medication Adjustments/Labs and Tests Ordered: Current medicines are reviewed at length with the patient today.  Concerns regarding medicines are outlined above.  Orders Placed This Encounter  Procedures  . EKG 12-Lead   Meds ordered this encounter  Medications  . metoprolol succinate (TOPROL-XL) 25 MG 24 hr tablet    Sig: Take 1.5 tablets (37.5 mg total) by mouth daily.    Dispense:  135 tablet    Refill:  3    Patient Instructions  Medication Instructions:  1) INCREASE TOPROL to 37.5 mg (1.5 tablets) once daily  Labwork: None  Testing/Procedures: None  Follow-Up: Your provider wants you to follow-up in: 1 year with Dr. Burt Knack.  You will receive a reminder letter in the mail two months in advance. If you don't receive a letter, please call our office to schedule the follow-up appointment.       Signed, Sherren Mocha, MD  04/28/2018 10:21 AM    [place]

## 2018-05-22 ENCOUNTER — Other Ambulatory Visit: Payer: Self-pay | Admitting: Gastroenterology

## 2018-05-31 ENCOUNTER — Ambulatory Visit (INDEPENDENT_AMBULATORY_CARE_PROVIDER_SITE_OTHER): Payer: Medicare Other | Admitting: *Deleted

## 2018-05-31 VITALS — BP 144/76 | HR 87 | Resp 17 | Ht 64.0 in | Wt 108.0 lb

## 2018-05-31 DIAGNOSIS — Z0000 Encounter for general adult medical examination without abnormal findings: Secondary | ICD-10-CM

## 2018-05-31 NOTE — Patient Instructions (Signed)
Continue doing brain stimulating activities (puzzles, reading, adult coloring books, staying active) to keep memory sharp.   Continue to eat heart healthy diet (full of fruits, vegetables, whole grains, lean protein, water--limit salt, fat, and sugar intake) and increase physical activity as tolerated.   Lindsay Doyle , Thank you for taking time to come for your Medicare Wellness Visit. I appreciate your ongoing commitment to your health goals. Please review the following plan we discussed and let me know if I can assist you in the future.   These are the goals we discussed: Goals    . Patient Stated     Maintain current healthy status, stay as active and as independent as possible    . Patient Stated     Maintain my weight by increasing my lunch. Continue to exercise.     . wants to improve memory     Continue to work puzzles. I will try to do a puzzle per day.       This is a list of the screening recommended for you and due dates:  Health Maintenance  Topic Date Due  . Tetanus Vaccine  07/18/2018  . Flu Shot  Completed  . DEXA scan (bone density measurement)  Completed  . Pneumonia vaccines  Completed   Health Maintenance, Female Adopting a healthy lifestyle and getting preventive care can go a long way to promote health and wellness. Talk with your health care provider about what schedule of regular examinations is right for you. This is a good chance for you to check in with your provider about disease prevention and staying healthy. In between checkups, there are plenty of things you can do on your own. Experts have done a lot of research about which lifestyle changes and preventive measures are most likely to keep you healthy. Ask your health care provider for more information. Weight and diet Eat a healthy diet  Be sure to include plenty of vegetables, fruits, low-fat dairy products, and lean protein.  Do not eat a lot of foods high in solid fats, added sugars, or  salt.  Get regular exercise. This is one of the most important things you can do for your health. ? Most adults should exercise for at least 150 minutes each week. The exercise should increase your heart rate and make you sweat (moderate-intensity exercise). ? Most adults should also do strengthening exercises at least twice a week. This is in addition to the moderate-intensity exercise. Maintain a healthy weight  Body mass index (BMI) is a measurement that can be used to identify possible weight problems. It estimates body fat based on height and weight. Your health care provider can help determine your BMI and help you achieve or maintain a healthy weight.  For females 83 years of age and older: ? A BMI below 18.5 is considered underweight. ? A BMI of 18.5 to 24.9 is normal. ? A BMI of 25 to 29.9 is considered overweight. ? A BMI of 30 and above is considered obese. Watch levels of cholesterol and blood lipids  You should start having your blood tested for lipids and cholesterol at 83 years of age, then have this test every 5 years.  You may need to have your cholesterol levels checked more often if: ? Your lipid or cholesterol levels are high. ? You are older than 83 years of age. ? You are at high risk for heart disease. Cancer screening Lung Cancer  Lung cancer screening is recommended for adults 55-80  years old who are at high risk for lung cancer because of a history of smoking.  A yearly low-dose CT scan of the lungs is recommended for people who: ? Currently smoke. ? Have quit within the past 15 years. ? Have at least a 30-pack-year history of smoking. A pack year is smoking an average of one pack of cigarettes a day for 1 year.  Yearly screening should continue until it has been 15 years since you quit.  Yearly screening should stop if you develop a health problem that would prevent you from having lung cancer treatment. Breast Cancer  Practice breast self-awareness.  This means understanding how your breasts normally appear and feel.  It also means doing regular breast self-exams. Let your health care provider know about any changes, no matter how small.  If you are in your 20s or 30s, you should have a clinical breast exam (CBE) by a health care provider every 1-3 years as part of a regular health exam.  If you are 38 or older, have a CBE every year. Also consider having a breast X-ray (mammogram) every year.  If you have a family history of breast cancer, talk to your health care provider about genetic screening.  If you are at high risk for breast cancer, talk to your health care provider about having an MRI and a mammogram every year.  Breast cancer gene (BRCA) assessment is recommended for women who have family members with BRCA-related cancers. BRCA-related cancers include: ? Breast. ? Ovarian. ? Tubal. ? Peritoneal cancers.  Results of the assessment will determine the need for genetic counseling and BRCA1 and BRCA2 testing. Cervical Cancer Your health care provider may recommend that you be screened regularly for cancer of the pelvic organs (ovaries, uterus, and vagina). This screening involves a pelvic examination, including checking for microscopic changes to the surface of your cervix (Pap test). You may be encouraged to have this screening done every 3 years, beginning at age 39.  For women ages 86-65, health care providers may recommend pelvic exams and Pap testing every 3 years, or they may recommend the Pap and pelvic exam, combined with testing for human papilloma virus (HPV), every 5 years. Some types of HPV increase your risk of cervical cancer. Testing for HPV may also be done on women of any age with unclear Pap test results.  Other health care providers may not recommend any screening for nonpregnant women who are considered low risk for pelvic cancer and who do not have symptoms. Ask your health care provider if a screening pelvic  exam is right for you.  If you have had past treatment for cervical cancer or a condition that could lead to cancer, you need Pap tests and screening for cancer for at least 20 years after your treatment. If Pap tests have been discontinued, your risk factors (such as having a new sexual partner) need to be reassessed to determine if screening should resume. Some women have medical problems that increase the chance of getting cervical cancer. In these cases, your health care provider may recommend more frequent screening and Pap tests. Colorectal Cancer  This type of cancer can be detected and often prevented.  Routine colorectal cancer screening usually begins at 83 years of age and continues through 83 years of age.  Your health care provider may recommend screening at an earlier age if you have risk factors for colon cancer.  Your health care provider may also recommend using home test kits to  check for hidden blood in the stool.  A small camera at the end of a tube can be used to examine your colon directly (sigmoidoscopy or colonoscopy). This is done to check for the earliest forms of colorectal cancer.  Routine screening usually begins at age 24.  Direct examination of the colon should be repeated every 5-10 years through 83 years of age. However, you may need to be screened more often if early forms of precancerous polyps or small growths are found. Skin Cancer  Check your skin from head to toe regularly.  Tell your health care provider about any new moles or changes in moles, especially if there is a change in a mole's shape or color.  Also tell your health care provider if you have a mole that is larger than the size of a pencil eraser.  Always use sunscreen. Apply sunscreen liberally and repeatedly throughout the day.  Protect yourself by wearing long sleeves, pants, a wide-brimmed hat, and sunglasses whenever you are outside. Heart disease, diabetes, and high blood  pressure  High blood pressure causes heart disease and increases the risk of stroke. High blood pressure is more likely to develop in: ? People who have blood pressure in the high end of the normal range (130-139/85-89 mm Hg). ? People who are overweight or obese. ? People who are African American.  If you are 95-26 years of age, have your blood pressure checked every 3-5 years. If you are 59 years of age or older, have your blood pressure checked every year. You should have your blood pressure measured twice-once when you are at a hospital or clinic, and once when you are not at a hospital or clinic. Record the average of the two measurements. To check your blood pressure when you are not at a hospital or clinic, you can use: ? An automated blood pressure machine at a pharmacy. ? A home blood pressure monitor.  If you are between 62 years and 23 years old, ask your health care provider if you should take aspirin to prevent strokes.  Have regular diabetes screenings. This involves taking a blood sample to check your fasting blood sugar level. ? If you are at a normal weight and have a low risk for diabetes, have this test once every three years after 83 years of age. ? If you are overweight and have a high risk for diabetes, consider being tested at a younger age or more often. Preventing infection Hepatitis B  If you have a higher risk for hepatitis B, you should be screened for this virus. You are considered at high risk for hepatitis B if: ? You were born in a country where hepatitis B is common. Ask your health care provider which countries are considered high risk. ? Your parents were born in a high-risk country, and you have not been immunized against hepatitis B (hepatitis B vaccine). ? You have HIV or AIDS. ? You use needles to inject street drugs. ? You live with someone who has hepatitis B. ? You have had sex with someone who has hepatitis B. ? You get hemodialysis  treatment. ? You take certain medicines for conditions, including cancer, organ transplantation, and autoimmune conditions. Hepatitis C  Blood testing is recommended for: ? Everyone born from [birth year] through 1963. ? Anyone with known risk factors for hepatitis C. Sexually transmitted infections (STIs)  You should be screened for sexually transmitted infections (STIs) including gonorrhea and chlamydia if: ? You are sexually active and  are younger than 83 years of age. ? You are older than 83 years of age and your health care provider tells you that you are at risk for this type of infection. ? Your sexual activity has changed since you were last screened and you are at an increased risk for chlamydia or gonorrhea. Ask your health care provider if you are at risk.  If you do not have HIV, but are at risk, it may be recommended that you take a prescription medicine daily to prevent HIV infection. This is called pre-exposure prophylaxis (PrEP). You are considered at risk if: ? You are sexually active and do not regularly use condoms or know the HIV status of your partner(s). ? You take drugs by injection. ? You are sexually active with a partner who has HIV. Talk with your health care provider about whether you are at high risk of being infected with HIV. If you choose to begin PrEP, you should first be tested for HIV. You should then be tested every 3 months for as long as you are taking PrEP. Pregnancy  If you are premenopausal and you may become pregnant, ask your health care provider about preconception counseling.  If you may become pregnant, take 400 to 800 micrograms (mcg) of folic acid every day.  If you want to prevent pregnancy, talk to your health care provider about birth control (contraception). Osteoporosis and menopause  Osteoporosis is a disease in which the bones lose minerals and strength with aging. This can result in serious bone fractures. Your risk for osteoporosis can be  identified using a bone density scan.  If you are 30 years of age or older, or if you are at risk for osteoporosis and fractures, ask your health care provider if you should be screened.  Ask your health care provider whether you should take a calcium or vitamin D supplement to lower your risk for osteoporosis.  Menopause may have certain physical symptoms and risks.  Hormone replacement therapy may reduce some of these symptoms and risks. Talk to your health care provider about whether hormone replacement therapy is right for you. Follow these instructions at home:  Schedule regular health, dental, and eye exams.  Stay current with your immunizations.  Do not use any tobacco products including cigarettes, chewing tobacco, or electronic cigarettes.  If you are pregnant, do not drink alcohol.  If you are breastfeeding, limit how much and how often you drink alcohol.  Limit alcohol intake to no more than 1 drink per day for nonpregnant women. One drink equals 12 ounces of beer, 5 ounces of Sheena Simonis, or 1 ounces of hard liquor.  Do not use street drugs.  Do not share needles.  Ask your health care provider for help if you need support or information about quitting drugs.  Tell your health care provider if you often feel depressed.  Tell your health care provider if you have ever been abused or do not feel safe at home. This information is not intended to replace advice given to you by your health care provider. Make sure you discuss any questions you have with your health care provider. Document Released: 09/30/2010 Document Revised: 08/23/2015 Document Reviewed: 12/19/2014 [employer] Interactive Patient Education  2019 [employer].

## 2018-05-31 NOTE — Progress Notes (Addendum)
Subjective:   Lindsay Doyle is a 85 y.o. female who presents for Medicare Annual (Subsequent) preventive examination.  Review of Systems:  No ROS.  Medicare Wellness Visit. Additional risk factors are reflected in the social history.  Cardiac Risk Factors include: advanced age (>36men, >35 women);dyslipidemia Sleep patterns: feels rested on waking, gets up 1 times nightly to void and sleeps 6-7 hours nightly.    Home Safety/Smoke Alarms: Feels safe in home. Smoke alarms in place.  Living environment; residence and Firearm Safety: apartment. Lives with husband, no needs for DME, good support system Seat Belt Safety/Bike Helmet: Wears seat belt.     Objective:     Vitals: BP (!) 144/76   Pulse 87   Resp 17   Ht 5\' 4"  (1.626 m)   Wt 108 lb (49 kg)   SpO2 97%   BMI 18.54 kg/m   Body mass index is 18.54 kg/m.  Advanced Directives 05/31/2018 05/26/2017 05/20/2016 01/19/2016 01/19/2016 04/27/2015 12/28/2014  Does Patient Have a Medical Advance Directive? Yes Yes Yes No No Yes Yes  Type of Paramedic of [place];Living will [place];Living will [place];Living will - - [place];Living will [place];Living will  Does patient want to make changes to medical advance directive? - - - - - No - Patient declined -  Copy of [place] in Chart? No - copy requested No - copy requested No - copy requested - - No - copy requested No - copy requested  Would patient like information on creating a medical advance directive? - - - No - patient declined information - - -    Tobacco Social History   Tobacco Use  Smoking Status Never Smoker  Smokeless Tobacco Never Used     Counseling given: Not Answered  Past Medical History:  Diagnosis Date  . ANEMIA-IRON DEFICIENCY    presumed GI bleed 08/2012- anticoag stopped  . Atrial fibrillation ([place])    a. Apixaban started 03/2012  - stopped after admx for GI bleed 6/14  . Blood transfusion    a. 05/2010: 3 transfusions for anemia/GIB.  [name] CAD (coronary artery disease)    a. LHC 1/14:  mLAD serial 30 and 60%, oRCA 30%, EF 60%, Afib,   . Cataract   . COLONIC POLYPS, HX OF 03/09/2007  . COPD 03/09/2007   ? pt is unsure  . GERD (gastroesophageal reflux disease)   . GI bleed    thought to be due to AVMs  . Hx of echocardiogram    a. echo 2/11:  EF 55-60%, mild AI, mild RAE, mild to mod TR;   b.  a. Echo 2/14:  Mild focal basal and mild LVH, EF 60-65%, mild AI, mild RAE, trivia effusion  . HYPERGLYCEMIA 07/02/2007  . Hyperlipidemia   . Hypertension 11/12/2006  . LEUKOPENIA, MILD 05/30/2008  . Mitral valve prolapse    Remotely - last echo 2011 did not show this  . NSTEMI (non-ST elevated myocardial infarction) ([place]) 03/2012   a. Type II in setting of AF with RVR 03/2012  . OSTEOARTHRITIS 02/19/2009   Spinal  . OSTEOPOROSIS 11/12/2006  . Positive H. pylori test 10/1996  . PSVT    a. Remotely, in setting of anemia.  [name] Spinal stenosis of lumbar region 07/16/2012  . Transfusion history    7/15    Past Surgical History:  Procedure Laterality Date  . ABDOMINAL HYSTERECTOMY    .  CARDIAC CATHETERIZATION  1992   S/P in 1992 at [facility] in [place]. This was negative for any coronary artery disease  . carotid duplex  08/29/2003  . CATARACT EXTRACTION    . ceasarean    . ENTEROSCOPY N/A 10/10/2013   Procedure: ENTEROSCOPY;  Surgeon: Inda Castle, MD;  Location: [facility] ENDOSCOPY;  Service: Endoscopy;  Laterality: N/A;  . ENTEROSCOPY N/A 12/28/2014   Procedure: ENTEROSCOPY;  Surgeon: Inda Castle, MD;  Location: [facility] ENDOSCOPY;  Service: Endoscopy;  Laterality: N/A;  . ENTEROSCOPY N/A 01/20/2016   Procedure: ENTEROSCOPY;  Surgeon: Manus Gunning, MD;  Location: [facility] ENDOSCOPY;  Service: Gastroenterology;  Laterality: N/A;  . HOT HEMOSTASIS N/A 12/28/2014   Procedure: HOT HEMOSTASIS (ARGON PLASMA  COAGULATION/BICAP);  Surgeon: Inda Castle, MD;  Location: [facility];  Service: Endoscopy;  Laterality: N/A;  . LEFT HEART CATHETERIZATION WITH CORONARY ANGIOGRAM N/A 04/23/2012   Procedure: LEFT HEART CATHETERIZATION WITH CORONARY ANGIOGRAM;  Surgeon: Larey Dresser, MD;  Location[facility] C[facility] LAB;  Service: Cardiovascular;  Laterality: N/A;  . TONSILLECTOMY    . TUBAL LIGATION     Family History  Problem Relation Age of Onset  . Prostate cancer Brother   . Stomach cancer Mother   . Sarcoidosis Sister   . Colon cancer Brother   . Sarcoidosis Other   . CAD Neg Hx    Social History   Socioeconomic History  . Marital status: Married    Spouse name: Not on file  . Number of children: 1  . Years of education: Not on file  . Highest education level: Not on file  Occupational History  . Occupation: retired    Comment: retired  Scientific laboratory technician  . Financial resource strain: Not hard at all  . Food insecurity:    Worry: Never true    Inability: Never true  . Transportation needs:    Medical: No    Non-medical: No  Tobacco Use  . Smoking status: Never Smoker  . Smokeless tobacco: Never Used  Substance and Sexual Activity  . Alcohol use: No  . Drug use: No  . Sexual activity: Never  Lifestyle  . Physical activity:    Days per week: 6 days    Minutes per session: 60 min  . Stress: Only a little  Relationships  . Social connections:    Talks on phone: More than three times a week    Gets together: More than three times a week    Attends religious service: More than 4 times per year    Active member of club or organization: Not on file    Attends meetings of clubs or organizations: More than 4 times per year    Relationship status: Married  Other Topics Concern  . Not on file  Social History Narrative  . Not on file    Outpatient Encounter Medications as of 05/31/2018  Medication Sig  . acetaminophen (TYLENOL) 500 MG tablet Take 2 tablets (1,000 mg total) by mouth 2 (two)  times daily.  [name] atorvastatin (LIPITOR) 20 MG tablet TAKE 1 TABLET (20 MG TOTAL) BY MOUTH DAILY.  . ferrous sulfate 325 (65 FE) MG tablet Take 1 tablet (325 mg total) by mouth 2 (two) times daily with a meal.  . metoprolol succinate (TOPROL-XL) 25 MG 24 hr tablet Take 1.5 tablets (37.5 mg total) by mouth daily.  . Multiple Vitamin (MULTIVITAMIN) tablet Take 1 tablet by mouth daily.  . nitroGLYCERIN (NITROSTAT) 0.4  MG SL tablet Place 1 tablet (0.4 mg total) under the tongue every 5 (five) minutes as needed for chest pain (maximum of 3 tablets).   No facility-administered encounter medications on file as of 05/31/2018.     Activities of Daily Living In your present state of health, do you have any difficulty performing the following activities: 05/31/2018  Hearing? N  Vision? N  Difficulty concentrating or making decisions? N  Walking or climbing stairs? N  Dressing or bathing? N  Doing errands, shopping? N  Preparing Food and eating ? N  Using the Toilet? N  In the past six months, have you accidently leaked urine? N  Do you have problems with loss of bowel control? N  Managing your Medications? N  Managing your Finances? N  Housekeeping or managing your Housekeeping? N  Some recent data might be hidden    Patient Care Team: Biagio Borg, MD as PCP - General (Internal Medicine) Sherren Mocha, MD as PCP - Cardiology (Cardiology) Armbruster, Carlota Raspberry, MD as Consulting Physician (Gastroenterology)    Assessment:   This is a routine wellness examination for Lindsay Doyle. Physical assessment deferred to PCP.  Exercise Activities and Dietary recommendations Current Exercise Habits: Home exercise routine;Structured exercise class, Type of exercise: walking;calisthenics;strength training/weights, Time (Minutes): 30, Frequency (Times/Week): 6, Weekly Exercise (Minutes/Week): 180, Intensity: Mild, Exercise limited by: orthopedic condition(s)  Diet (meal preparation, eat out, water intake,  caffeinated beverages, dairy products, fruits and vegetables): in general, a "healthy" diet  , well balanced   Reviewed heart healthy diet.  Discussed supplementing with Ensure to ensure patient maintains her weight, samples and coupons provided. Encouraged patient to [facility] daily water and healthy fluid intake.  Goals    . Patient Stated     Maintain current healthy status, stay as active and as independent as possible    . Patient Stated     Maintain my weight by increasing my lunch. Continue to exercise.     . wants to improve memory     Continue to work puzzles. I will try to do a puzzle per day.       Fall Risk Fall Risk  05/31/2018 03/12/2018 05/26/2017 04/01/2017 05/20/2016  Falls in the past year? 0 0 Yes Yes No  Number falls in past yr: - - 1 1 -  Injury with Fall? - - Yes Yes -  Comment - - - broken toe -    Depression Screen PHQ 2/9 Scores 05/31/2018 03/12/2018 05/26/2017 05/20/2016  PHQ - 2 Score 0 1 0 0  PHQ- 9 Score - - 0 -     Cognitive Function MMSE - Mini Mental State Exam 05/31/2018 05/26/2017 05/20/2016  Orientation to time 5 5 5   Orientation to Place 5 5 5   Registration 3 3 3   Attention/ Calculation 5 5 5   Recall 3 1 1   Language- name 2 objects 2 2 2   Language- repeat 1 1 1   Language- follow 3 step command 3 3 3   Language- read & follow direction 1 1 1   Write a sentence 1 1 1   Copy design 1 1 1   Total score 30 28 28         Immunization History  Administered Date(s) Administered  . Influenza Split 03/10/2011, 01/08/2012  . Influenza Whole 02/07/2008, 02/05/2009, 02/18/2010  . Influenza, High Dose Seasonal PF 01/25/2013, 03/06/2015, 01/16/2017, 01/08/2018  . Influenza,inj,Quad PF,6+ Mos 01/25/2014, 01/03/2016  . Pneumococcal Conjugate-13 02/08/2013  . Pneumococcal Polysaccharide-23 12/29/1997, 01/08/2012  . Td 07/29/1996,  07/17/2008   Screening Tests Health Maintenance  Topic Date Due  . TETANUS/TDAP  07/18/2018  . INFLUENZA VACCINE  Completed  .  DEXA SCAN  Completed  . PNA vac Low Risk Adult  Completed      Plan:     Reviewed health maintenance screenings with patient today and relevant education, vaccines, and/or referrals were provided.   Continue doing brain stimulating activities (puzzles, reading, adult coloring books, staying active) to keep memory sharp.   Continue to eat heart healthy diet (full of fruits, vegetables, whole grains, lean protein, water--limit salt, fat, and sugar intake) and increase physical activity as tolerated.  I have personally reviewed and noted the following in the patient's chart:   . Medical and social history . Use of alcohol, tobacco or illicit drugs  . Current medications and supplements . Functional ability and status . Nutritional status . Physical activity . Advanced directives . List of other physicians . Vitals . Screenings to include cognitive, depression, and falls . Referrals and appointments  In addition, I have reviewed and discussed with patient certain preventive protocols, quality metrics, and best practice recommendations. A written personalized care plan for preventive services as well as general preventive health recommendations were provided to patient.     Michiel Cowboy, RN  05/31/2018  Medical screening examination/treatment/procedure(s) were performed by non-physician practitioner and as supervising physician I was immediately available for consultation/collaboration. I agree with above. Lew Dawes, MD

## 2018-06-08 DIAGNOSIS — Z124 Encounter for screening for malignant neoplasm of cervix: Secondary | ICD-10-CM | POA: Diagnosis not present

## 2018-06-08 DIAGNOSIS — Z1231 Encounter for screening mammogram for malignant neoplasm of breast: Secondary | ICD-10-CM | POA: Diagnosis not present

## 2018-06-08 DIAGNOSIS — Z6820 Body mass index (BMI) 20.0-20.9, adult: Secondary | ICD-10-CM | POA: Diagnosis not present

## 2018-06-14 ENCOUNTER — Other Ambulatory Visit: Payer: Self-pay | Admitting: Internal Medicine

## 2018-06-16 ENCOUNTER — Telehealth: Payer: Self-pay | Admitting: Gastroenterology

## 2018-06-16 NOTE — Telephone Encounter (Signed)
Pt just cancelled her ov with Dr. Havery Moros for next week due to concerns regarding Covid 19. She wants to know if she is due for labs and if she could wait longer to have them done. Pls call her.

## 2018-06-16 NOTE — Telephone Encounter (Signed)
The pt was advised she is due for labs in July and her appt has been cancelled.  Lab order in Epic

## 2018-06-17 ENCOUNTER — Other Ambulatory Visit: Payer: Self-pay | Admitting: Gastroenterology

## 2018-06-22 ENCOUNTER — Ambulatory Visit: Payer: Medicare Other | Admitting: Gastroenterology

## 2018-06-30 ENCOUNTER — Ambulatory Visit: Payer: Medicare Other | Admitting: Cardiovascular Disease

## 2018-08-12 ENCOUNTER — Other Ambulatory Visit: Payer: Self-pay | Admitting: Gastroenterology

## 2018-09-04 ENCOUNTER — Other Ambulatory Visit: Payer: Self-pay | Admitting: Gastroenterology

## 2018-09-16 ENCOUNTER — Ambulatory Visit (INDEPENDENT_AMBULATORY_CARE_PROVIDER_SITE_OTHER): Payer: Medicare Other | Admitting: Internal Medicine

## 2018-09-16 ENCOUNTER — Encounter: Payer: Self-pay | Admitting: Internal Medicine

## 2018-09-16 ENCOUNTER — Other Ambulatory Visit (INDEPENDENT_AMBULATORY_CARE_PROVIDER_SITE_OTHER): Payer: Medicare Other

## 2018-09-16 ENCOUNTER — Other Ambulatory Visit: Payer: Self-pay

## 2018-09-16 VITALS — BP 120/76 | HR 100 | Temp 97.7°F | Ht 64.0 in | Wt 154.0 lb

## 2018-09-16 DIAGNOSIS — E538 Deficiency of other specified B group vitamins: Secondary | ICD-10-CM

## 2018-09-16 DIAGNOSIS — D500 Iron deficiency anemia secondary to blood loss (chronic): Secondary | ICD-10-CM

## 2018-09-16 DIAGNOSIS — E559 Vitamin D deficiency, unspecified: Secondary | ICD-10-CM

## 2018-09-16 DIAGNOSIS — E785 Hyperlipidemia, unspecified: Secondary | ICD-10-CM

## 2018-09-16 DIAGNOSIS — R739 Hyperglycemia, unspecified: Secondary | ICD-10-CM

## 2018-09-16 DIAGNOSIS — I10 Essential (primary) hypertension: Secondary | ICD-10-CM

## 2018-09-16 LAB — LIPID PANEL
Cholesterol: 265 mg/dL — ABNORMAL HIGH (ref 0–200)
HDL: 76 mg/dL (ref >39.00)
LDL Cholesterol: 157 mg/dL — ABNORMAL HIGH (ref 0–99)
NonHDL: 189.12
Total CHOL/HDL Ratio: 3
Triglycerides: 159 mg/dL — ABNORMAL HIGH (ref 0.0–149.0)
VLDL: 31.8 mg/dL (ref 0.0–40.0)

## 2018-09-16 LAB — CBC WITH DIFFERENTIAL/PLATELET
Basophils Absolute: 0 10*3/uL (ref 0.0–0.1)
Basophils Relative: 0.6 % (ref 0.0–3.0)
Eosinophils Absolute: 0.3 10*3/uL (ref 0.0–0.7)
Eosinophils Relative: 4.8 % (ref 0.0–5.0)
HCT: 34.5 % — ABNORMAL LOW (ref 36.0–46.0)
Hemoglobin: 11.7 g/dL — ABNORMAL LOW (ref 12.0–15.0)
Lymphocytes Relative: 47.6 % — ABNORMAL HIGH (ref 12.0–46.0)
Lymphs Abs: 2.6 10*3/uL (ref 0.7–4.0)
MCHC: 34 g/dL (ref 30.0–36.0)
MCV: 99.1 fl (ref 78.0–100.0)
Monocytes Absolute: 0.7 10*3/uL (ref 0.1–1.0)
Monocytes Relative: 13.2 % — ABNORMAL HIGH (ref 3.0–12.0)
Neutro Abs: 1.8 10*3/uL (ref 1.4–7.7)
Neutrophils Relative %: 33.8 % — ABNORMAL LOW (ref 43.0–77.0)
Platelets: 253 10*3/uL (ref 150.0–400.0)
RBC: 3.48 Mil/uL — ABNORMAL LOW (ref 3.87–5.11)
RDW: 13.9 % (ref 11.5–15.5)
WBC: 5.4 10*3/uL (ref 4.0–10.5)

## 2018-09-16 LAB — URINALYSIS, ROUTINE W REFLEX MICROSCOPIC
Bilirubin Urine: NEGATIVE
Ketones, ur: NEGATIVE
Leukocytes,Ua: NEGATIVE
Nitrite: NEGATIVE
Specific Gravity, Urine: 1.01 (ref 1.000–1.030)
Total Protein, Urine: NEGATIVE
Urine Glucose: NEGATIVE
Urobilinogen, UA: 0.2 (ref 0.0–1.0)
pH: 6.5 (ref 5.0–8.0)

## 2018-09-16 LAB — BASIC METABOLIC PANEL
BUN: 18 mg/dL (ref 6–23)
CO2: 25 mEq/L (ref 19–32)
Calcium: 10.1 mg/dL (ref 8.4–10.5)
Chloride: 100 mEq/L (ref 96–112)
Creatinine, Ser: 0.98 mg/dL (ref 0.40–1.20)
GFR: 65.55 mL/min (ref >60.00)
Glucose, Bld: 104 mg/dL — ABNORMAL HIGH (ref 70–99)
Potassium: 4.5 mEq/L (ref 3.5–5.1)
Sodium: 138 mEq/L (ref 135–145)

## 2018-09-16 LAB — FERRITIN: Ferritin: 57.2 ng/mL (ref 10.0–291.0)

## 2018-09-16 LAB — HEPATIC FUNCTION PANEL
ALT: 10 U/L (ref 0–35)
AST: 14 U/L (ref 0–37)
Albumin: 4.4 g/dL (ref 3.5–5.2)
Alkaline Phosphatase: 66 U/L (ref 39–117)
Bilirubin, Direct: 0 mg/dL (ref 0.0–0.3)
Total Bilirubin: 0.3 mg/dL (ref 0.2–1.2)
Total Protein: 7.7 g/dL (ref 6.0–8.3)

## 2018-09-16 LAB — HEMOGLOBIN A1C: Hgb A1c MFr Bld: 6.1 % (ref 4.6–6.5)

## 2018-09-16 LAB — TSH: TSH: 2.2 u[IU]/mL (ref 0.35–4.50)

## 2018-09-16 LAB — IBC PANEL
Iron: 67 ug/dL (ref 42–145)
Saturation Ratios: 19.3 % — ABNORMAL LOW (ref 20.0–50.0)
Transferrin: 248 mg/dL (ref 212.0–360.0)

## 2018-09-16 LAB — VITAMIN D 25 HYDROXY (VIT D DEFICIENCY, FRACTURES): VITD: 47.02 ng/mL (ref 30.00–100.00)

## 2018-09-16 LAB — VITAMIN B12: Vitamin B-12: 1086 pg/mL — ABNORMAL HIGH (ref 211–911)

## 2018-09-16 NOTE — Progress Notes (Signed)
Subjective:    Patient ID: Lindsay Doyle, female    DOB: November 15, 1933, 85 y.o.   MRN: [id number]  HPI  Here to f/u; overall doing ok,  Pt denies chest pain, increasing sob or doe, wheezing, orthopnea, PND, increased LE swelling, palpitations, dizziness or syncope.  Pt denies new neurological symptoms such as new headache, or facial or extremity weakness or numbness.  Pt denies polydipsia, polyuria, or low sugar episode.  Pt states overall good compliance with meds, mostly trying to follow appropriate diet, with wt overall stable,  but little exercise however.  BP Readings from Last 3 Encounters:  09/16/18 120/76  05/31/18 (!) 144/76  04/28/18 126/84   Past Medical History:  Diagnosis Date  . ANEMIA-IRON DEFICIENCY    presumed GI bleed 08/2012- anticoag stopped  . Atrial fibrillation ([place])    a. Apixaban started 03/2012 - stopped after admx for GI bleed 6/14  . Blood transfusion    a. 05/2010: 3 transfusions for anemia/GIB.  [name] CAD (coronary artery disease)    a. LHC 1/14:  mLAD serial 30 and 60%, oRCA 30%, EF 60%, Afib,   . Cataract   . COLONIC POLYPS, HX OF 03/09/2007  . COPD 03/09/2007   ? pt is unsure  . GERD (gastroesophageal reflux disease)   . GI bleed    thought to be due to AVMs  . Hx of echocardiogram    a. echo 2/11:  EF 55-60%, mild AI, mild RAE, mild to mod TR;   b.  a. Echo 2/14:  Mild focal basal and mild LVH, EF 60-65%, mild AI, mild RAE, trivia effusion  . HYPERGLYCEMIA 07/02/2007  . Hyperlipidemia   . Hypertension 11/12/2006  . LEUKOPENIA, MILD 05/30/2008  . Mitral valve prolapse    Remotely - last echo 2011 did not show this  . NSTEMI (non-ST elevated myocardial infarction) ([place]) 03/2012   a. Type II in setting of AF with RVR 03/2012  . OSTEOARTHRITIS 02/19/2009   Spinal  . OSTEOPOROSIS 11/12/2006  . Positive H. pylori test 10/1996  . PSVT    a. Remotely, in setting of anemia.  [name] Spinal stenosis of lumbar region 07/16/2012  . Transfusion history    7/15    Past  Surgical History:  Procedure Laterality Date  . ABDOMINAL HYSTERECTOMY    . CARDIAC CATHETERIZATION  1992   S/P in 1992 at [facility] in [place]. This was negative for any coronary artery disease  . carotid duplex  08/29/2003  . CATARACT EXTRACTION    . ceasarean    . ENTEROSCOPY N/A 10/10/2013   Procedure: ENTEROSCOPY;  Surgeon: Inda Castle, MD;  Location: [facility] ENDOSCOPY;  Service: Endoscopy;  Laterality: N/A;  . ENTEROSCOPY N/A 12/28/2014   Procedure: ENTEROSCOPY;  Surgeon: Inda Castle, MD;  Location: [facility] ENDOSCOPY;  Service: Endoscopy;  Laterality: N/A;  . ENTEROSCOPY N/A 01/20/2016   Procedure: ENTEROSCOPY;  Surgeon: Manus Gunning, MD;  Location: [facility] ENDOSCOPY;  Service: Gastroenterology;  Laterality: N/A;  . HOT HEMOSTASIS N/A 12/28/2014   Procedure: HOT HEMOSTASIS (ARGON PLASMA COAGULATION/BICAP);  Surgeon: Inda Castle, MD;  Location: [facility];  Service: Endoscopy;  Laterality: N/A;  . LEFT HEART CATHETERIZATION WITH CORONARY ANGIOGRAM N/A 04/23/2012   Procedure: LEFT HEART CATHETERIZATION WITH CORONARY ANGIOGRAM;  Surgeon: Larey Dresser, MD;  [facility] CATH LAB;  Service: Cardiovascular;  Laterality: N/A;  . TONSILLECTOMY    . TUBAL LIGATION      reports that she  has never smoked. She has never used smokeless tobacco. She reports that she does not drink alcohol or use drugs. family history includes Colon cancer in her brother; Prostate cancer in her brother; Sarcoidosis in her sister and another family member; Stomach cancer in her mother. Allergies  Allergen Reactions  . Doxycycline Diarrhea    Possible diarrhea  . Biaxin [Clarithromycin] Other (See Comments)    diarrhea  . Metronidazole Other (See Comments)    Pt had difficulty swallowing this and says it was "horrible" to take. No allergic reaction.   Current Outpatient Medications on File Prior to Visit  Medication Sig Dispense Refill  . acetaminophen (TYLENOL) 500 MG tablet  Take 2 tablets (1,000 mg total) by mouth 2 (two) times daily. 60 tablet 11  . atorvastatin (LIPITOR) 20 MG tablet TAKE 1 TABLET BY MOUTH EVERY DAY 90 tablet 1  . ferrous sulfate 325 (65 FE) MG tablet TAKE 1 TABLET BY MOUTH 2 TIMES DAILY WITH A MEAL. 60 tablet 1  . metoprolol succinate (TOPROL-XL) 25 MG 24 hr tablet Take 1.5 tablets (37.5 mg total) by mouth daily. 135 tablet 3  . Multiple Vitamin (MULTIVITAMIN) tablet Take 1 tablet by mouth daily.    . nitroGLYCERIN (NITROSTAT) 0.4 MG SL tablet Place 1 tablet (0.4 mg total) under the tongue every 5 (five) minutes as needed for chest pain (maximum of 3 tablets). 25 tablet 2   No current facility-administered medications on file prior to visit.    Review of Systems  Constitutional: Negative for other unusual diaphoresis or sweats HENT: Negative for ear discharge or swelling Eyes: Negative for other worsening visual disturbances Respiratory: Negative for stridor or other swelling  Gastrointestinal: Negative for worsening distension or other blood Genitourinary: Negative for retention or other urinary change Musculoskeletal: Negative for other MSK pain or swelling Skin: Negative for color change or other new lesions Neurological: Negative for worsening tremors and other numbness  Psychiatric/Behavioral: Negative for worsening agitation or other fatigue All other system neg per pt    Objective:   Physical Exam BP 120/76   Pulse 100   Temp 97.7 F (36.5 C) (Oral)   Ht 5\' 4"  (1.626 m)   Wt 154 lb (69.9 kg)   SpO2 97%   BMI 26.43 kg/m  VS noted,  Constitutional: Pt appears in NAD HENT: Head: NCAT.  Right Ear: External ear normal.  Left Ear: External ear normal.  Eyes: . Pupils are equal, round, and reactive to light. Conjunctivae and EOM are normal Nose: without d/c or deformity Neck: Neck supple. Gross normal ROM Cardiovascular: Normal rate and regular rhythm.   Pulmonary/Chest: Effort normal and breath sounds without rales or  wheezing.  Abd:  Soft, NT, ND, + BS, no organomegaly Neurological: Pt is alert. At baseline orientation, motor grossly intact Skin: Skin is warm. No rashes, other new lesions, no LE edema Psychiatric: Pt behavior is normal without agitation  No other exam findings Lab Results  Component Value Date   WBC 5.4 09/16/2018   HGB 11.7 (L) 09/16/2018   HCT 34.5 (L) 09/16/2018   PLT 253.0 09/16/2018   GLUCOSE 104 (H) 09/16/2018   CHOL 265 (H) 09/16/2018   TRIG 159.0 (H) 09/16/2018   HDL 76.00 09/16/2018   LDLDIRECT 223.7 01/08/2012   LDLCALC 157 (H) 09/16/2018   ALT 10 09/16/2018   AST 14 09/16/2018   NA 138 09/16/2018   K 4.5 09/16/2018   CL 100 09/16/2018   CREATININE 0.98 09/16/2018   BUN 18  09/16/2018   CO2 25 09/16/2018   TSH 2.20 09/16/2018   INR 1.06 01/20/2016   HGBA1C 6.1 09/16/2018        Assessment & Plan:

## 2018-09-16 NOTE — Patient Instructions (Addendum)
You had the Tdap tetanus shot today  You are given the letter today  Please continue all other medications as before, and refills have been done if requested.  Please have the pharmacy call with any other refills you may need.  Please continue your efforts at being more active, low cholesterol diet, and weight control.  You are otherwise up to date with prevention measures today.  Please keep your appointments with your specialists as you may have planned  Please go to the LAB in the Basement (turn left off the elevator) for the tests to be done today  You will be contacted by phone if any changes need to be made immediately.  Otherwise, you will receive a letter about your results with an explanation, but please check with MyChart first.  Please remember to sign up for MyChart if you have not done so, as this will be important to you in the future with finding out test results, communicating by private email, and scheduling acute appointments online when needed.  Please return in 6 months, or sooner if needed

## 2018-09-18 ENCOUNTER — Encounter: Payer: Self-pay | Admitting: Internal Medicine

## 2018-09-18 NOTE — Assessment & Plan Note (Signed)
stable overall by history and exam, recent data reviewed with pt, and pt to continue medical treatment as before,  to f/u any worsening symptoms or concerns, for iron with labs

## 2018-09-18 NOTE — Assessment & Plan Note (Signed)
stable overall by history and exam, recent data reviewed with pt, and pt to continue medical treatment as before,  to f/u any worsening symptoms or concerns, for lipids with labs 

## 2018-09-18 NOTE — Assessment & Plan Note (Signed)
stable overall by history and exam, recent data reviewed with pt, and pt to continue medical treatment as before,  to f/u any worsening symptoms or concerns  

## 2018-10-02 ENCOUNTER — Other Ambulatory Visit: Payer: Self-pay | Admitting: Gastroenterology

## 2018-11-24 ENCOUNTER — Other Ambulatory Visit (INDEPENDENT_AMBULATORY_CARE_PROVIDER_SITE_OTHER): Payer: Medicare Other

## 2018-11-24 ENCOUNTER — Other Ambulatory Visit: Payer: Self-pay

## 2018-11-24 DIAGNOSIS — D649 Anemia, unspecified: Secondary | ICD-10-CM

## 2018-11-24 LAB — CBC WITH DIFFERENTIAL/PLATELET
Basophils Absolute: 0 10*3/uL (ref 0.0–0.1)
Basophils Relative: 0.6 % (ref 0.0–3.0)
Eosinophils Absolute: 0.3 10*3/uL (ref 0.0–0.7)
Eosinophils Relative: 4.7 % (ref 0.0–5.0)
HCT: 35.9 % — ABNORMAL LOW (ref 36.0–46.0)
Hemoglobin: 11.8 g/dL — ABNORMAL LOW (ref 12.0–15.0)
Lymphocytes Relative: 50.5 % — ABNORMAL HIGH (ref 12.0–46.0)
Lymphs Abs: 3.1 10*3/uL (ref 0.7–4.0)
MCHC: 32.8 g/dL (ref 30.0–36.0)
MCV: 99.9 fl (ref 78.0–100.0)
Monocytes Absolute: 0.8 10*3/uL (ref 0.1–1.0)
Monocytes Relative: 12.3 % — ABNORMAL HIGH (ref 3.0–12.0)
Neutro Abs: 2 10*3/uL (ref 1.4–7.7)
Neutrophils Relative %: 31.9 % — ABNORMAL LOW (ref 43.0–77.0)
Platelets: 265 10*3/uL (ref 150.0–400.0)
RBC: 3.6 Mil/uL — ABNORMAL LOW (ref 3.87–5.11)
RDW: 13.1 % (ref 11.5–15.5)
WBC: 6.2 10*3/uL (ref 4.0–10.5)

## 2018-12-05 ENCOUNTER — Other Ambulatory Visit: Payer: Self-pay | Admitting: Internal Medicine

## 2018-12-10 ENCOUNTER — Telehealth: Payer: Self-pay

## 2018-12-10 NOTE — Telephone Encounter (Signed)
Copied from [place] [id number]. Topic: General - Other >> Dec 09, 2018  1:44 PM Leward Quan A wrote: Reason for CRM: Patient called to inquire from Dr Ronnald Ramp what she can use for her lips because she have very chapped lips. Per patient its been about 5 months since she last wore lipstick and now her lips are very dry and chapped she can not use Vaseline because it say do not swallow. Patient would like a call back at Ph#  [phone] or [phone] >> Dec 09, 2018  1:50 PM Morey Hummingbird wrote: Aquaphor?

## 2018-12-10 NOTE — Telephone Encounter (Signed)
Ok for pt to try aquaphor in the form of blistex lip balm or similar, and pharmacist should be able to help as well

## 2018-12-10 NOTE — Telephone Encounter (Signed)
Pt has been informed and expressed understanding.  

## 2018-12-13 ENCOUNTER — Other Ambulatory Visit: Payer: Self-pay | Admitting: *Deleted

## 2018-12-13 MED ORDER — NITROGLYCERIN 0.4 MG SL SUBL: 0.4000 mg | SUBLINGUAL_TABLET | SUBLINGUAL | 1 refills | Status: DC | PRN

## 2018-12-23 ENCOUNTER — Ambulatory Visit (INDEPENDENT_AMBULATORY_CARE_PROVIDER_SITE_OTHER): Payer: Medicare Other

## 2018-12-23 ENCOUNTER — Other Ambulatory Visit: Payer: Self-pay

## 2018-12-23 DIAGNOSIS — Z23 Encounter for immunization: Secondary | ICD-10-CM | POA: Diagnosis not present

## 2019-03-18 ENCOUNTER — Encounter: Payer: Self-pay | Admitting: Internal Medicine

## 2019-03-18 ENCOUNTER — Other Ambulatory Visit (INDEPENDENT_AMBULATORY_CARE_PROVIDER_SITE_OTHER): Payer: Medicare Other

## 2019-03-18 ENCOUNTER — Ambulatory Visit (INDEPENDENT_AMBULATORY_CARE_PROVIDER_SITE_OTHER): Payer: Medicare Other | Admitting: Internal Medicine

## 2019-03-18 ENCOUNTER — Other Ambulatory Visit: Payer: Self-pay

## 2019-03-18 VITALS — BP 130/84 | HR 100 | Temp 97.8°F | Ht 64.0 in | Wt 111.0 lb

## 2019-03-18 DIAGNOSIS — I10 Essential (primary) hypertension: Secondary | ICD-10-CM

## 2019-03-18 DIAGNOSIS — F411 Generalized anxiety disorder: Secondary | ICD-10-CM

## 2019-03-18 DIAGNOSIS — R739 Hyperglycemia, unspecified: Secondary | ICD-10-CM

## 2019-03-18 DIAGNOSIS — E785 Hyperlipidemia, unspecified: Secondary | ICD-10-CM

## 2019-03-18 DIAGNOSIS — N184 Chronic kidney disease, stage 4 (severe): Secondary | ICD-10-CM

## 2019-03-18 LAB — CBC WITH DIFFERENTIAL/PLATELET
Basophils Absolute: 0 10*3/uL (ref 0.0–0.1)
Basophils Relative: 0.8 % (ref 0.0–3.0)
Eosinophils Absolute: 0.2 10*3/uL (ref 0.0–0.7)
Eosinophils Relative: 2.6 % (ref 0.0–5.0)
HCT: 36.6 % (ref 36.0–46.0)
Hemoglobin: 12.3 g/dL (ref 12.0–15.0)
Lymphocytes Relative: 33.9 % (ref 12.0–46.0)
Lymphs Abs: 2.2 10*3/uL (ref 0.7–4.0)
MCHC: 33.5 g/dL (ref 30.0–36.0)
MCV: 101.6 fl — ABNORMAL HIGH (ref 78.0–100.0)
Monocytes Absolute: 0.7 10*3/uL (ref 0.1–1.0)
Monocytes Relative: 10.8 % (ref 3.0–12.0)
Neutro Abs: 3.4 10*3/uL (ref 1.4–7.7)
Neutrophils Relative %: 51.9 % (ref 43.0–77.0)
Platelets: 251 10*3/uL (ref 150.0–400.0)
RBC: 3.61 Mil/uL — ABNORMAL LOW (ref 3.87–5.11)
RDW: 13 % (ref 11.5–15.5)
WBC: 6.5 10*3/uL (ref 4.0–10.5)

## 2019-03-18 LAB — LIPID PANEL
Cholesterol: 204 mg/dL — ABNORMAL HIGH (ref 0–200)
HDL: 68.3 mg/dL (ref >39.00)
NonHDL: 136.12
Total CHOL/HDL Ratio: 3
Triglycerides: 212 mg/dL — ABNORMAL HIGH (ref 0.0–149.0)
VLDL: 42.4 mg/dL — ABNORMAL HIGH (ref 0.0–40.0)

## 2019-03-18 LAB — HEPATIC FUNCTION PANEL
ALT: 11 U/L (ref 0–35)
AST: 17 U/L (ref 0–37)
Albumin: 4.5 g/dL (ref 3.5–5.2)
Alkaline Phosphatase: 56 U/L (ref 39–117)
Bilirubin, Direct: 0.1 mg/dL (ref 0.0–0.3)
Total Bilirubin: 0.3 mg/dL (ref 0.2–1.2)
Total Protein: 7.8 g/dL (ref 6.0–8.3)

## 2019-03-18 LAB — HEMOGLOBIN A1C: Hgb A1c MFr Bld: 5.9 % (ref 4.6–6.5)

## 2019-03-18 LAB — BASIC METABOLIC PANEL
BUN: 20 mg/dL (ref 6–23)
CO2: 30 mEq/L (ref 19–32)
Calcium: 10 mg/dL (ref 8.4–10.5)
Chloride: 101 mEq/L (ref 96–112)
Creatinine, Ser: 1.03 mg/dL (ref 0.40–1.20)
GFR: 61.82 mL/min (ref >60.00)
Glucose, Bld: 89 mg/dL (ref 70–99)
Potassium: 4.9 mEq/L (ref 3.5–5.1)
Sodium: 136 mEq/L (ref 135–145)

## 2019-03-18 LAB — LDL CHOLESTEROL, DIRECT: Direct LDL: 106 mg/dL

## 2019-03-18 NOTE — Patient Instructions (Signed)

## 2019-03-18 NOTE — Progress Notes (Signed)
Subjective:    Patient ID: Lindsay Doyle, female    DOB: 1933-05-23, 85 y.o.   MRN: [id number]  HPI  Here to f/u; overall doing ok,  Pt denies chest pain, increasing sob or doe, wheezing, orthopnea, PND, increased LE swelling, palpitations, dizziness or syncope.  Pt denies new neurological symptoms such as new headache, or facial or extremity weakness or numbness.  Pt denies polydipsia, polyuria, or low sugar episode.  Pt states overall good compliance with meds, mostly trying to follow appropriate diet, with wt overall stable,  but little exercise however.  No new complaints BP Readings from Last 3 Encounters:  03/18/19 130/84  09/16/18 120/76  05/31/18 (!) 144/76   Wt Readings from Last 3 Encounters:  03/18/19 111 lb (50.3 kg)  09/16/18 154 lb (69.9 kg)  05/31/18 108 lb (49 kg)   Past Medical History:  Diagnosis Date  . ANEMIA-IRON DEFICIENCY    presumed GI bleed 08/2012- anticoag stopped  . Atrial fibrillation ([place])    a. Apixaban started 03/2012 - stopped after admx for GI bleed 6/14  . Blood transfusion    a. 05/2010: 3 transfusions for anemia/GIB.  [name] CAD (coronary artery disease)    a. LHC 1/14:  mLAD serial 30 and 60%, oRCA 30%, EF 60%, Afib,   . Cataract   . COLONIC POLYPS, HX OF 03/09/2007  . COPD 03/09/2007   ? pt is unsure  . GERD (gastroesophageal reflux disease)   . GI bleed    thought to be due to AVMs  . Hx of echocardiogram    a. echo 2/11:  EF 55-60%, mild AI, mild RAE, mild to mod TR;   b.  a. Echo 2/14:  Mild focal basal and mild LVH, EF 60-65%, mild AI, mild RAE, trivia effusion  . HYPERGLYCEMIA 07/02/2007  . Hyperlipidemia   . Hypertension 11/12/2006  . LEUKOPENIA, MILD 05/30/2008  . Mitral valve prolapse    Remotely - last echo 2011 did not show this  . NSTEMI (non-ST elevated myocardial infarction) ([place]) 03/2012   a. Type II in setting of AF with RVR 03/2012  . OSTEOARTHRITIS 02/19/2009   Spinal  . OSTEOPOROSIS 11/12/2006  . Positive H. pylori test 10/1996    . PSVT    a. Remotely, in setting of anemia.  [name] Spinal stenosis of lumbar region 07/16/2012  . Transfusion history    7/15    Past Surgical History:  Procedure Laterality Date  . ABDOMINAL HYSTERECTOMY    . CARDIAC CATHETERIZATION  1992   S/P in 1992 at [facility] in [place]. This was negative for any coronary artery disease  . carotid duplex  08/29/2003  . CATARACT EXTRACTION    . ceasarean    . ENTEROSCOPY N/A 10/10/2013   Procedure: ENTEROSCOPY;  Surgeon: Inda Castle, MD;  Location: [facility] ENDOSCOPY;  Service: Endoscopy;  Laterality: N/A;  . ENTEROSCOPY N/A 12/28/2014   Procedure: ENTEROSCOPY;  Surgeon: Inda Castle, MD;  Location: [facility] ENDOSCOPY;  Service: Endoscopy;  Laterality: N/A;  . ENTEROSCOPY N/A 01/20/2016   Procedure: ENTEROSCOPY;  Surgeon: Manus Gunning, MD;  Location: [facility] ENDOSCOPY;  Service: Gastroenterology;  Laterality: N/A;  . HOT HEMOSTASIS N/A 12/28/2014   Procedure: HOT HEMOSTASIS (ARGON PLASMA COAGULATION/BICAP);  Surgeon: Inda Castle, MD;  Location: [facility];  Service: Endoscopy;  Laterality: N/A;  . LEFT HEART CATHETERIZATION WITH CORONARY ANGIOGRAM N/A 04/23/2012   Procedure: LEFT HEART CATHETERIZATION WITH CORONARY ANGIOGRAM;  Surgeon: Elby Showers  [name], MD;  Location: [facility] CATH LAB;  Service: Cardiovascular;  Laterality: N/A;  . TONSILLECTOMY    . TUBAL LIGATION      reports that she has never smoked. She has never used smokeless tobacco. She reports that she does not drink alcohol or use drugs. family history includes Colon cancer in her brother; Prostate cancer in her brother; Sarcoidosis in her sister and another family member; Stomach cancer in her mother. Allergies  Allergen Reactions  . Doxycycline Diarrhea    Possible diarrhea  . Biaxin [Clarithromycin] Other (See Comments)    diarrhea  . Metronidazole Other (See Comments)    Pt had difficulty swallowing this and says it was "horrible" to take. No allergic  reaction.   Current Outpatient Medications on File Prior to Visit  Medication Sig Dispense Refill  . acetaminophen (TYLENOL) 500 MG tablet Take 2 tablets (1,000 mg total) by mouth 2 (two) times daily. 60 tablet 11  . atorvastatin (LIPITOR) 20 MG tablet TAKE 1 TABLET BY MOUTH EVERY DAY 90 tablet 1  . ferrous sulfate 325 (65 FE) MG tablet TAKE 1 TABLET BY MOUTH 2 TIMES DAILY WITH A MEAL. 60 tablet 5  . metoprolol succinate (TOPROL-XL) 25 MG 24 hr tablet Take 1.5 tablets (37.5 mg total) by mouth daily. 135 tablet 3  . Multiple Vitamin (MULTIVITAMIN) tablet Take 1 tablet by mouth daily.    . nitroGLYCERIN (NITROSTAT) 0.4 MG SL tablet Place 1 tablet (0.4 mg total) under the tongue every 5 (five) minutes as needed for chest pain (maximum of 3 tablets). 25 tablet 1   No current facility-administered medications on file prior to visit.   Review of Systems  Constitutional: Negative for other unusual diaphoresis or sweats HENT: Negative for ear discharge or swelling Eyes: Negative for other worsening visual disturbances Respiratory: Negative for stridor or other swelling  Gastrointestinal: Negative for worsening distension or other blood Genitourinary: Negative for retention or other urinary change Musculoskeletal: Negative for other MSK pain or swelling Skin: Negative for color change or other new lesions Neurological: Negative for worsening tremors and other numbness  Psychiatric/Behavioral: Negative for worsening agitation or other fatigue All otherwise neg per pt     Objective:   Physical Exam BP 130/84   Pulse 100   Temp 97.8 F (36.6 C) (Oral)   Ht 5\' 4"  (1.626 m)   Wt 111 lb (50.3 kg)   SpO2 97%   BMI 19.05 kg/m  VS noted,  Constitutional: Pt appears in NAD HENT: Head: NCAT.  Right Ear: External ear normal.  Left Ear: External ear normal.  Eyes: . Pupils are equal, round, and reactive to light. Conjunctivae and EOM are normal Nose: without d/c or deformity Neck: Neck supple.  Gross normal ROM Cardiovascular: Normal rate and regular rhythm.   Pulmonary/Chest: Effort normal and breath sounds without rales or wheezing.  Abd:  Soft, NT, ND, + BS, no organomegaly Neurological: Pt is alert. At baseline orientation, motor grossly intact Skin: Skin is warm. No rashes, other new lesions, no LE edema Psychiatric: Pt behavior is normal without agitation . All otherwise neg per pt  Lab Results  Component Value Date   WBC 6.2 11/24/2018   HGB 11.8 (L) 11/24/2018   HCT 35.9 (L) 11/24/2018   PLT 265.0 11/24/2018   GLUCOSE 104 (H) 09/16/2018   CHOL 265 (H) 09/16/2018   TRIG 159.0 (H) 09/16/2018   HDL 76.00 09/16/2018   LDLDIRECT 223.7 01/08/2012   LDLCALC 157 (H) 09/16/2018   ALT  10 09/16/2018   AST 14 09/16/2018   NA 138 09/16/2018   K 4.5 09/16/2018   CL 100 09/16/2018   CREATININE 0.98 09/16/2018   BUN 18 09/16/2018   CO2 25 09/16/2018   TSH 2.20 09/16/2018   INR 1.06 01/20/2016   HGBA1C 6.1 09/16/2018       Assessment & Plan:

## 2019-03-19 ENCOUNTER — Encounter: Payer: Self-pay | Admitting: Internal Medicine

## 2019-03-19 NOTE — Assessment & Plan Note (Signed)
stable overall by history and exam, recent data reviewed with pt, and pt to continue medical treatment as before,  to f/u any worsening symptoms or concerns  

## 2019-04-06 ENCOUNTER — Other Ambulatory Visit: Payer: Self-pay | Admitting: Gastroenterology

## 2019-05-11 ENCOUNTER — Ambulatory Visit (INDEPENDENT_AMBULATORY_CARE_PROVIDER_SITE_OTHER): Payer: Medicare Other | Admitting: Gastroenterology

## 2019-05-11 ENCOUNTER — Encounter: Payer: Self-pay | Admitting: Gastroenterology

## 2019-05-11 VITALS — BP 120/70 | HR 104 | Temp 97.8°F | Ht 61.25 in | Wt 112.1 lb

## 2019-05-11 DIAGNOSIS — K5520 Angiodysplasia of colon without hemorrhage: Secondary | ICD-10-CM | POA: Diagnosis not present

## 2019-05-11 DIAGNOSIS — D649 Anemia, unspecified: Secondary | ICD-10-CM

## 2019-05-11 NOTE — Patient Instructions (Addendum)
If you are age 84 or older, your body mass index should be between 23-30. Your Body mass index is 21.01 kg/m. If this is out of the aforementioned range listed, please consider follow up with your Primary Care Provider.  If you are age 64 or younger, your body mass index should be between 19-25. Your Body mass index is 21.01 kg/m. If this is out of the aformentioned range listed, please consider follow up with your Primary Care Provider.   You will be due for labs (CBC) in June 2021. We will remind you when it is time to go to the lab.  We would like to see you back in the office in 6 months for a follow up visit.  Thank you for entrusting me with your care and for choosing [facility], Dr. Hannibal Cellar

## 2019-05-11 NOTE — Progress Notes (Signed)
HPI :  86 year old female here for a follow-up visit for anemia and history of GI bleeding.  She has a history of GI bleeding due to small bowel AVMs.  I performed a push enteroscopy for her in October 2017 when she was admitted for GI bleed.  At that time she had a hemoglobin of 6 on admission was noted to have an actively bleeding small bowel AVM that was treated endoscopically.  She has had no recurrence of bleeding since that time.  Prior to her procedure in 2017 she had a small bowel enteroscopy in 2016 with Dr. Deatra Ina, she had a gastric AVM ablated with APC on that procedure.  She has had iron deficiency dating back to 2012 at which time she had a normal colonoscopy, EGD showing gastritis, and capsule endoscopy without any significant findings.  In light of her anemia history with small bowel AVMs she has been maintained on chronic oral iron which she tolerates well.  She has not noticed any bleeding symptoms since have last seen her.  No problems with her bowels which are otherwise regular.  She denies any abdominal pains.  She is eating well, she has no complaints at all today and is feeling well.  She denies any NSAID use.  Her last labs in June 2020 showed normal iron levels and ferritin level okay.  B12 levels normal. Her last CBC was performed in December 2020 showing hemoglobin of 12.3.    Past Medical History:  Diagnosis Date  . ANEMIA-IRON DEFICIENCY    presumed GI bleed 08/2012- anticoag stopped  . Atrial fibrillation ([place])    a. Apixaban started 03/2012 - stopped after admx for GI bleed 6/14  . Blood transfusion    a. 05/2010: 3 transfusions for anemia/GIB.  [name] CAD (coronary artery disease)    a. LHC 1/14:  mLAD serial 30 and 60%, oRCA 30%, EF 60%, Afib,   . Cataract   . COLONIC POLYPS, HX OF 03/09/2007  . COPD 03/09/2007   ? pt is unsure  . GERD (gastroesophageal reflux disease)   . GI bleed    thought to be due to AVMs  . Hx of echocardiogram    a. echo 2/11:  EF 55-60%,  mild AI, mild RAE, mild to mod TR;   b.  a. Echo 2/14:  Mild focal basal and mild LVH, EF 60-65%, mild AI, mild RAE, trivia effusion  . HYPERGLYCEMIA 07/02/2007  . Hyperlipidemia   . Hypertension 11/12/2006  . LEUKOPENIA, MILD 05/30/2008  . Mitral valve prolapse    Remotely - last echo 2011 did not show this  . NSTEMI (non-ST elevated myocardial infarction) ([place]) 03/2012   a. Type II in setting of AF with RVR 03/2012  . OSTEOARTHRITIS 02/19/2009   Spinal  . OSTEOPOROSIS 11/12/2006  . Positive H. pylori test 10/1996  . PSVT    a. Remotely, in setting of anemia.  [name] Spinal stenosis of lumbar region 07/16/2012  . Transfusion history    7/15      Past Surgical History:  Procedure Laterality Date  . ABDOMINAL HYSTERECTOMY    . CARDIAC CATHETERIZATION  1992   S/P in 1992 at [facility] in [place]. This was negative for any coronary artery disease  . carotid duplex  08/29/2003  . CATARACT EXTRACTION    . ceasarean    . ENTEROSCOPY N/A 10/10/2013   Procedure: ENTEROSCOPY;  Surgeon: Inda Castle, MD;  Location: [facility] ENDOSCOPY;  Service: Endoscopy;  Laterality: N/A;  . ENTEROSCOPY N/A 12/28/2014   Procedure: ENTEROSCOPY;  Surgeon: Inda Castle, MD;  Location: [facility] ENDOSCOPY;  Service: Endoscopy;  Laterality: N/A;  . ENTEROSCOPY N/A 01/20/2016   Procedure: ENTEROSCOPY;  Surgeon: Manus Gunning, MD;  Location: [facility] ENDOSCOPY;  Service: Gastroenterology;  Laterality: N/A;  . HOT HEMOSTASIS N/A 12/28/2014   Procedure: HOT HEMOSTASIS (ARGON PLASMA COAGULATION/BICAP);  Surgeon: Inda Castle, MD;  Location: [facility] ENDOS[facility];  Service: Endoscopy;  Laterality: N/A;  . LEFT HEART CATHETERIZATION WITH CORONARY ANGIOGRAM N/A 04/23/2012   Procedure: LEFT HEART CATHETERIZATION WITH CORONARY ANGIOGRAM;  Surgeon: Larey Dresser, MD;  Location: [facility] CATH LAB;  Service: Cardiovascular;  Laterality: N/A;  . TONSILLECTOMY    . TUBAL LIGATION     Family History  Problem Relation Age of  Onset  . Prostate cancer Brother   . Stomach cancer Mother   . Sarcoidosis Sister   . Colon cancer Brother   . Sarcoidosis Other   . CAD Neg Hx    Social History   Tobacco Use  . Smoking status: Never Smoker  . Smokeless tobacco: Never Used  Substance Use Topics  . Alcohol use: No  . Drug use: No   Current Outpatient Medications  Medication Sig Dispense Refill  . acetaminophen (TYLENOL) 500 MG tablet Take 2 tablets (1,000 mg total) by mouth 2 (two) times daily. 60 tablet 11  . atorvastatin (LIPITOR) 20 MG tablet TAKE 1 TABLET BY MOUTH EVERY DAY 90 tablet 1  . ferrous sulfate 325 (65 FE) MG tablet TAKE 1 TABLET BY MOUTH 2 TIMES DAILY WITH A MEAL. 180 tablet 0  . metoprolol succinate (TOPROL-XL) 25 MG 24 hr tablet Take 1.5 tablets (37.5 mg total) by mouth daily. 135 tablet 3  . Multiple Vitamin (MULTIVITAMIN) tablet Take 1 tablet by mouth daily.    . nitroGLYCERIN (NITROSTAT) 0.4 MG SL tablet Place 1 tablet (0.4 mg total) under the tongue every 5 (five) minutes as needed for chest pain (maximum of 3 tablets). (Patient not taking: Reported on 05/11/2019) 25 tablet 1   No current facility-administered medications for this visit.   Allergies  Allergen Reactions  . Doxycycline Diarrhea    Possible diarrhea  . Biaxin [Clarithromycin] Other (See Comments)    diarrhea  . Metronidazole Other (See Comments)    Pt had difficulty swallowing this and says it was "horrible" to take. No allergic reaction.     Review of Systems: All systems reviewed and negative except where noted in HPI.   CBC Latest Ref Rng & Units 03/18/2019 11/24/2018 09/16/2018  WBC 4.0 - 10.5 K/uL 6.5 6.2 5.4  Hemoglobin 12.0 - 15.0 g/dL 12.3 11.8(L) 11.7(L)  Hematocrit 36.0 - 46.0 % 36.6 35.9(L) 34.5(L)  Platelets 150.0 - 400.0 K/uL 251.0 265.0 253.0    Lab Results  Component Value Date   IRON 67 09/16/2018   TIBC 391 01/19/2016   FERRITIN 57.2 09/16/2018     Physical Exam: BP 120/70 (BP Location: Left  Arm, Patient Position: Sitting, Cuff Size: Normal)   Pulse (!) 104   Temp 97.8 F (36.6 C)   Ht 5' 1.25" (1.556 m) Comment: height measured without shoes  Wt 112 lb 2 oz (50.9 kg)   BMI 21.01 kg/m  Constitutional: Pleasant,well-developed, female in no acute distress. Neurological: Alert and oriented to person place and time. Psychiatric: Normal mood and affect. Behavior is normal.   ASSESSMENT AND PLAN: 86 y/o female here for reassessment of the following issues:  Anemia /  history of GI bleeding - history of upper GI bleeding and iron deficiency anemia in the past due to AVMs, most recently noted in the small bowel in 2017.  No recurrence of bleeding since that last exam.  She has maintained on oral iron and is tolerating it well, her hemoglobin is currently normal and her iron stores are okay.  No further work-up is needed at this time.  I counseled her that she does not warrant another colonoscopy for screening purposes given her age.  We will continue to check her CBC and iron studies periodically, will next be due in June for that.  She can contact me in the interim with any questions.  All questions answered she agreed with the plan.  New  Cellar, MD [facility]astroenter[facility]

## 2019-05-12 ENCOUNTER — Ambulatory Visit (INDEPENDENT_AMBULATORY_CARE_PROVIDER_SITE_OTHER): Payer: Medicare Other | Admitting: Cardiovascular Disease

## 2019-05-12 ENCOUNTER — Encounter: Payer: Self-pay | Admitting: Cardiovascular Disease

## 2019-05-12 ENCOUNTER — Other Ambulatory Visit: Payer: Self-pay

## 2019-05-12 VITALS — BP 124/78 | HR 100 | Ht 61.25 in | Wt 112.0 lb

## 2019-05-12 DIAGNOSIS — I480 Paroxysmal atrial fibrillation: Secondary | ICD-10-CM | POA: Diagnosis not present

## 2019-05-12 NOTE — Patient Instructions (Signed)
Medication Instructions:  Your provider recommends that you continue on your current medications as directed. Please refer to the Current Medication list given to you today.   *If you need a refill on your cardiac medications before your next appointment, please call your pharmacy*  Follow-Up: At [facility], you and your health needs are our priority.  As part of our continuing mission to provide you with exceptional heart care, we have created designated Provider Care Teams.  These Care Teams include your primary Cardiologist (physician) and Advanced Practice Providers (APPs -  Physician Assistants and Nurse Practitioners) who all work together to provide you with the care you need, when you need it. Your next appointment:   12 month(s) The format for your next appointment:   In Person Provider:   You may see Sherren Mocha, MD or one of the following Advanced Practice Providers on your designated Care Team:    [name], PA-C  [place]in [place], [place]  Daune Perch, [place]

## 2019-05-12 NOTE — Progress Notes (Signed)
Cardiology Office Note:    Date:  05/12/2019   ID:  Lindsay Doyle, Lindsay Doyle 10-12-1933, MRN [id number]  PCP:  Biagio Borg, MD  Cardiologist:  Sherren Mocha, MD  Electrophysiologist:  None   Referring MD: Biagio Borg, MD   Chief Complaint  Patient presents with  . Palpitations    History of Present Illness:    Lindsay Doyle is a 86 y.o. female with a hx of paroxysmal atrial fibrillation and heart palpitations.  The patient had atrial fibrillation in 2014 associated with elevated troponin.  Cardiac catheterization demonstrated mild nonobstructive coronary disease.  The patient has had significant GI bleeding secondary to AVMs and she is not able to tolerate anticoagulant drugs.  She is here alone today.  She has done quite well since last year's evaluation.  At that time, her metoprolol succinate was increased by 50%.  She denies any recent heart palpitations, lightheadedness, chest pain, or shortness of breath.  She denies orthopnea, PND, or leg swelling.  She stays active with no exertional symptoms.  She has been on chronic iron supplementation and her recent hemoglobin is increased and now within normal limits.  Past Medical History:  Diagnosis Date  . ANEMIA-IRON DEFICIENCY    presumed GI bleed 08/2012- anticoag stopped  . Atrial fibrillation ([name])    a. Apixaban started 03/2012 - stopped after admx for GI bleed 6/14  . Blood transfusion    a. 05/2010: 3 transfusions for anemia/GIB.  [name] CAD (coronary artery disease)    a. LHC 1/14:  mLAD serial 30 and 60%, oRCA 30%, EF 60%, Afib,   . Cataract   . COLONIC POLYPS, HX OF 03/09/2007  . COPD 03/09/2007   ? pt is unsure  . GERD (gastroesophageal reflux disease)   . GI bleed    thought to be due to AVMs  . Hx of echocardiogram    a. echo 2/11:  EF 55-60%, mild AI, mild RAE, mild to mod TR;   b.  a. Echo 2/14:  Mild focal basal and mild LVH, EF 60-65%, mild AI, mild RAE, trivia effusion  . HYPERGLYCEMIA 07/02/2007  . Hyperlipidemia    . Hypertension 11/12/2006  . LEUKOPENIA, MILD 05/30/2008  . Mitral valve prolapse    Remotely - last echo 2011 did not show this  . NSTEMI (non-ST elevated myocardial infarction) ([place]) 03/2012   a. Type II in setting of AF with RVR 03/2012  . OSTEOARTHRITIS 02/19/2009   Spinal  . OSTEOPOROSIS 11/12/2006  . Positive H. pylori test 10/1996  . PSVT    a. Remotely, in setting of anemia.  [name] Spinal stenosis of lumbar region 07/16/2012  . Transfusion history    7/15     Past Surgical History:  Procedure Laterality Date  . ABDOMINAL HYSTERECTOMY    . CARDIAC CATHETERIZATION  1992   S/P in 1992 at [facility] in [place]. This was negative for any coronary artery disease  . carotid duplex  08/29/2003  . CATARACT EXTRACTION    . ceasarean    . ENTEROSCOPY N/A 10/10/2013   Procedure: ENTEROSCOPY;  Surgeon: Inda Castle, MD;  Location: [facility] ENDOSCOPY;  Service: Endoscopy;  Laterality: N/A;  . ENTEROSCOPY N/A 12/28/2014   Procedure: ENTEROSCOPY;  Surgeon: Inda Castle, MD;  Location: [facility] ENDOSCOPY;  Service: Endoscopy;  Laterality: N/A;  . ENTEROSCOPY N/A 01/20/2016   Procedure: ENTEROSCOPY;  Surgeon: Manus Gunning, MD;  Location[facility]COPY;  Service: Gastroenterology;  Laterality:  N/A;  . HOT HEMOSTASIS N/A 12/28/2014   Procedure: HOT HEMOSTASIS (ARGON PLASMA COAGULATION/BICAP);  Surgeon: Inda Castle, MD;  Location: [facility];  Service: Endoscopy;  Laterality: N/A;  . LEFT HEART CATHETERIZATION WITH CORONARY ANGIOGRAM N/A 04/23/2012   Procedure: LEFT HEART CATHETERIZATION WITH CORONARY ANGIOGRAM;  Surgeon: Larey Dresser, MD;  Location: [facility] CATH LAB;  Service: Cardiovascular;  Laterality: N/A;  . TONSILLECTOMY    . TUBAL LIGATION      Current Medications: Current Meds  Medication Sig  . acetaminophen (TYLENOL) 500 MG tablet Take 2 tablets (1,000 mg total) by mouth 2 (two) times daily.  [name] atorvastatin (LIPITOR) 20 MG tablet TAKE 1 TABLET BY MOUTH EVERY  DAY  . ferrous sulfate 325 (65 FE) MG tablet TAKE 1 TABLET BY MOUTH 2 TIMES DAILY WITH A MEAL.  . metoprolol succinate (TOPROL-XL) 25 MG 24 hr tablet Take 1.5 tablets (37.5 mg total) by mouth daily.  . Multiple Vitamin (MULTIVITAMIN) tablet Take 1 tablet by mouth daily.  . nitroGLYCERIN (NITROSTAT) 0.4 MG SL tablet Place 1 tablet (0.4 mg total) under the tongue every 5 (five) minutes as needed for chest pain (maximum of 3 tablets).     Allergies:   Doxycycline, Biaxin [clarithromycin], and Metronidazole   Social History   Socioeconomic History  . Marital status: Married    Spouse name: Not on file  . Number of children: 1  . Years of education: Not on file  . Highest education level: Not on file  Occupational History  . Occupation: retired    Comment: retired  Tobacco Use  . Smoking status: Never Smoker  . Smokeless tobacco: Never Used  Substance and Sexual Activity  . Alcohol use: No  . Drug use: No  . Sexual activity: Never  Other Topics Concern  . Not on file  Social History Narrative  . Not on file   Social Determinants of Health   Financial Resource Strain:   . Difficulty of Paying Living Expenses: Not on file  Food Insecurity:   . Worried About Charity fundraiser in the Last Year: Not on file  . Ran Out of Food in the Last Year: Not on file  Transportation Needs:   . Lack of Transportation (Medical): Not on file  . Lack of Transportation (Non-Medical): Not on file  Physical Activity: Unknown  . Days of Exercise per Week: 6 days  . Minutes of Exercise per Session: Not on file  Stress:   . Feeling of Stress : Not on file  Social Connections:   . Frequency of Communication with Friends and Family: Not on file  . Frequency of Social Gatherings with Friends and Family: Not on file  . Attends Religious Services: Not on file  . Active Member of Clubs or Organizations: Not on file  . Attends Archivist Meetings: Not on file  . Marital Status: Not on file       Family History: The patient's family history includes Colon cancer in her brother; Prostate cancer in her brother; Sarcoidosis in her sister and another family member; Stomach cancer in her mother. There is no history of CAD.  ROS:   Please see the history of present illness.    All other systems reviewed and are negative.  EKGs/Labs/Other Studies Reviewed:    EKG:  EKG is ordered today.  The ekg ordered today demonstrates sinus tachycardia 100 bpm, otherwise within normal limits.  Recent Labs: 09/16/2018: TSH 2.20 03/18/2019: ALT 11; BUN 20;  Creatinine, Ser 1.03; Hemoglobin 12.3; Platelets 251.0; Potassium 4.9; Sodium 136  Recent Lipid Panel    Component Value Date/Time   CHOL 204 (H) 03/18/2019 1137   TRIG 212.0 (H) 03/18/2019 1137   HDL 68.30 03/18/2019 1137   CHOLHDL 3 03/18/2019 1137   VLDL 42.4 (H) 03/18/2019 1137   LDLCALC 157 (H) 09/16/2018 1020   LDLDIRECT 106.0 03/18/2019 1137    Physical Exam:    VS:  BP 124/78   Pulse 100   Ht 5' 1.25" (1.556 m)   Wt 112 lb (50.8 kg)   BMI 20.99 kg/m     Wt Readings from Last 3 Encounters:  05/12/19 112 lb (50.8 kg)  05/11/19 112 lb 2 oz (50.9 kg)  03/18/19 111 lb (50.3 kg)     GEN: Thin, elderly woman, pleasant, in no acute distress HEENT: Normal NECK: No JVD; No carotid bruits LYMPHATICS: No lymphadenopathy CARDIAC: RRR, no murmurs, rubs, gallops RESPIRATORY:  Clear to auscultation without rales, wheezing or rhonchi  ABDOMEN: Soft, non-tender, non-distended MUSCULOSKELETAL:  No edema; No deformity  SKIN: Warm and dry NEUROLOGIC:  Alert and oriented x 3 PSYCHIATRIC:  Normal affect   ASSESSMENT:    1. Paroxysmal atrial fibrillation (HCC)    PLAN:    In order of problems listed above:  1. No documented atrial fibrillation now in many years.  She continues on metoprolol succinate with good control of heart palpitations.  She is unable to tolerate anticoagulant therapy because of gastrointestinal bleeding.   Fortunately she remains in sinus rhythm.  I will see her back in 1 year for follow-up.   Medication Adjustments/Labs and Tests Ordered: Current medicines are reviewed at length with the patient today.  Concerns regarding medicines are outlined above.  Orders Placed This Encounter  Procedures  . EKG 12-Lead   No orders of the defined types were placed in this encounter.   Patient Instructions  Medication Instructions:  Your provider recommends that you continue on your current medications as directed. Please refer to the Current Medication list given to you today.   *If you need a refill on your cardiac medications before your next appointment, please call your pharmacy*  Follow-Up: At [facility], you and your health needs are our priority.  As part of our continuing mission to provide you with exceptional heart care, we have created designated Provider Care Teams.  These Care Teams include your primary Cardiologist (physician) and Advanced Practice Providers (APPs -  Physician Assistants and Nurse Practitioners) who all work together to provide you with the care you need, when you need it. Your next appointment:   12 month(s) The format for your next appointment:   In Person Provider:   You may see Sherren Mocha, MD or one of the following Advanced Practice Providers on your designated Care Team:    [name], PA-C  Vin Sisters, PA-C  Daune Perch, [place]    Signed, Sherren Mocha, MD  05/12/2019 1:36 PM    [place]

## 2019-05-14 ENCOUNTER — Ambulatory Visit: Payer: Medicare Other | Attending: Internal Medicine

## 2019-05-14 DIAGNOSIS — Z23 Encounter for immunization: Secondary | ICD-10-CM | POA: Insufficient documentation

## 2019-05-14 NOTE — Progress Notes (Signed)
   Covid-19 Vaccination Clinic  Name:  Lindsay Doyle. Ator    MRN: [id number] DOB: [birth year]  05/14/2019  Lindsay Doyle was observed post Covid-19 immunization for 15 minutes without incidence. She was provided with Vaccine Information Sheet and instruction to access the V-Safe system.   Lindsay Doyle was instructed to call 911 with any severe reactions post vaccine: [name] Difficulty breathing  . Swelling of your face and throat  . A fast heartbeat  . A bad rash all over your body  . Dizziness and weakness    Immunizations Administered    Name Date Dose VIS Date Route   Pfizer COVID-19 Vaccine 05/14/2019  9:18 AM 0.3 mL 03/11/2019 Intramuscular   Manufacturer: [place]   Lot: [id number]   [place]: [id number]

## 2019-05-23 ENCOUNTER — Encounter: Payer: Self-pay | Admitting: *Deleted

## 2019-05-31 DIAGNOSIS — H52222 Regular astigmatism, left eye: Secondary | ICD-10-CM | POA: Diagnosis not present

## 2019-05-31 DIAGNOSIS — H52221 Regular astigmatism, right eye: Secondary | ICD-10-CM | POA: Diagnosis not present

## 2019-05-31 DIAGNOSIS — H26491 Other secondary cataract, right eye: Secondary | ICD-10-CM | POA: Diagnosis not present

## 2019-05-31 DIAGNOSIS — H04123 Dry eye syndrome of bilateral lacrimal glands: Secondary | ICD-10-CM | POA: Diagnosis not present

## 2019-05-31 DIAGNOSIS — H5212 Myopia, left eye: Secondary | ICD-10-CM | POA: Diagnosis not present

## 2019-05-31 DIAGNOSIS — H524 Presbyopia: Secondary | ICD-10-CM | POA: Diagnosis not present

## 2019-05-31 DIAGNOSIS — H02054 Trichiasis without entropian left upper eyelid: Secondary | ICD-10-CM | POA: Diagnosis not present

## 2019-05-31 DIAGNOSIS — H02051 Trichiasis without entropian right upper eyelid: Secondary | ICD-10-CM | POA: Diagnosis not present

## 2019-06-01 ENCOUNTER — Emergency Department (HOSPITAL_COMMUNITY): Payer: Medicare Other

## 2019-06-01 ENCOUNTER — Other Ambulatory Visit: Payer: Self-pay

## 2019-06-01 ENCOUNTER — Emergency Department (HOSPITAL_COMMUNITY)
Admission: EM | Admit: 2019-06-01 | Discharge: 2019-06-01 | Disposition: A | Discharge status: Home / Self Care | Payer: Medicare Other | Attending: Emergency Medicine | Admitting: Emergency Medicine

## 2019-06-01 ENCOUNTER — Telehealth (HOSPITAL_COMMUNITY): Payer: Self-pay

## 2019-06-01 DIAGNOSIS — I4891 Unspecified atrial fibrillation: Secondary | ICD-10-CM | POA: Insufficient documentation

## 2019-06-01 DIAGNOSIS — I499 Cardiac arrhythmia, unspecified: Secondary | ICD-10-CM | POA: Diagnosis not present

## 2019-06-01 DIAGNOSIS — R000 Tachycardia, unspecified: Secondary | ICD-10-CM | POA: Diagnosis not present

## 2019-06-01 DIAGNOSIS — I252 Old myocardial infarction: Secondary | ICD-10-CM | POA: Insufficient documentation

## 2019-06-01 DIAGNOSIS — R0902 Hypoxemia: Secondary | ICD-10-CM | POA: Diagnosis not present

## 2019-06-01 DIAGNOSIS — R52 Pain, unspecified: Secondary | ICD-10-CM | POA: Diagnosis not present

## 2019-06-01 DIAGNOSIS — R002 Palpitations: Secondary | ICD-10-CM | POA: Diagnosis not present

## 2019-06-01 DIAGNOSIS — I5032 Chronic diastolic (congestive) heart failure: Secondary | ICD-10-CM | POA: Insufficient documentation

## 2019-06-01 DIAGNOSIS — I130 Hypertensive heart and chronic kidney disease with heart failure and stage 1 through stage 4 chronic kidney disease, or unspecified chronic kidney disease: Secondary | ICD-10-CM | POA: Diagnosis not present

## 2019-06-01 DIAGNOSIS — N184 Chronic kidney disease, stage 4 (severe): Secondary | ICD-10-CM | POA: Diagnosis not present

## 2019-06-01 DIAGNOSIS — I2510 Atherosclerotic heart disease of native coronary artery without angina pectoris: Secondary | ICD-10-CM | POA: Insufficient documentation

## 2019-06-01 LAB — CBC
HCT: 43.9 % (ref 36.0–46.0)
Hemoglobin: 13.3 g/dL (ref 12.0–15.0)
MCH: 34 pg (ref 26.0–34.0)
MCHC: 30.3 g/dL (ref 30.0–36.0)
MCV: 112.3 fL — ABNORMAL HIGH (ref 80.0–100.0)
Platelets: 256 10*3/uL (ref 150–400)
RBC: 3.91 MIL/uL (ref 3.87–5.11)
RDW: 12.2 % (ref 11.5–15.5)
WBC: 6.9 10*3/uL (ref 4.0–10.5)
nRBC: 0 % (ref 0.0–0.2)

## 2019-06-01 LAB — BASIC METABOLIC PANEL
Anion gap: 14 (ref 5–15)
BUN: 18 mg/dL (ref 8–23)
CO2: 21 mmol/L — ABNORMAL LOW (ref 22–32)
Calcium: 9.3 mg/dL (ref 8.9–10.3)
Chloride: 101 mmol/L (ref 98–111)
Creatinine, Ser: 1.21 mg/dL — ABNORMAL HIGH (ref 0.44–1.00)
GFR calc Af Amer: 48 mL/min — ABNORMAL LOW (ref >60)
GFR calc non Af Amer: 41 mL/min — ABNORMAL LOW (ref >60)
Glucose, Bld: 148 mg/dL — ABNORMAL HIGH (ref 70–99)
Potassium: 4.5 mmol/L (ref 3.5–5.1)
Sodium: 136 mmol/L (ref 135–145)

## 2019-06-01 LAB — MAGNESIUM: Magnesium: 2.3 mg/dL (ref 1.7–2.4)

## 2019-06-01 MED ORDER — SODIUM CHLORIDE 0.9 % IV BOLUS: 500.0000 mL | Freq: Once | INTRAVENOUS | Status: DC

## 2019-06-01 MED ORDER — SODIUM CHLORIDE 0.9% FLUSH: 3.0000 mL | Freq: Once | INTRAVENOUS | Status: DC

## 2019-06-01 NOTE — Progress Notes (Signed)
Thx Wille Glaser

## 2019-06-01 NOTE — ED Provider Notes (Signed)
[place] EMERGENCY DEPARTMENT Provider Note   CSN: [id number] Arrival date & time: 06/01/19  1120     History Chief Complaint  Patient presents with  . Palpitations    Lindsay Doyle is a 86 y.o. female.  HPI   Patient presented to ED for evaluation of palpitations.  Patient states she was at home when she suddenly felt her heart racing.  Patient felt weak and felt as though she was going to pass out.  Patient symptoms were severe.  She took the metoprolol that she has at home for this condition.  It was not effective.  She called EMS.  When EMS arrived they noted her heart rate was in the 230s.  She was given 10 mg of Cardizem IV.  Patient states she is feeling much better now.  She denies any chest pain.  She denies any fevers or chills.  No cough.  No vomiting or diarrhea.  Past Medical History:  Diagnosis Date  . ANEMIA-IRON DEFICIENCY    presumed GI bleed 08/2012- anticoag stopped  . Atrial fibrillation ([place])    a. Apixaban started 03/2012 - stopped after admx for GI bleed 6/14  . Blood transfusion    a. 05/2010: 3 transfusions for anemia/GIB.  [name] CAD (coronary artery disease)    a. LHC 1/14:  mLAD serial 30 and 60%, oRCA 30%, EF 60%, Afib,   . Cataract   . COLONIC POLYPS, HX OF 03/09/2007  . COPD 03/09/2007   ? pt is unsure  . GERD (gastroesophageal reflux disease)   . GI bleed    thought to be due to AVMs  . Hx of echocardiogram    a. echo 2/11:  EF 55-60%, mild AI, mild RAE, mild to mod TR;   b.  a. Echo 2/14:  Mild focal basal and mild LVH, EF 60-65%, mild AI, mild RAE, trivia effusion  . HYPERGLYCEMIA 07/02/2007  . Hyperlipidemia   . Hypertension 11/12/2006  . LEUKOPENIA, MILD 05/30/2008  . Mitral valve prolapse    Remotely - last echo 2011 did not show this  . NSTEMI (non-ST elevated myocardial infarction) ([place]) 03/2012   a. Type II in setting of AF with RVR 03/2012  . OSTEOARTHRITIS 02/19/2009   Spinal  . OSTEOPOROSIS 11/12/2006  . Positive H.  pylori test 10/1996  . PSVT    a. Remotely, in setting of anemia.  [name] Spinal stenosis of lumbar region 07/16/2012  . Transfusion history    7/15     Patient Active Problem List   Diagnosis Date Noted  . Toe pain, left 12/03/2016  . Dysuria 09/27/2016  . High ankle sprain 09/08/2016  . Constipation 03/10/2016  . Finger injury 03/04/2016  . Chronic diastolic heart failure ([place]) 01/21/2016  . AVM (arteriovenous malformation) of small bowel, acquired with hemorrhage   . Upper GI bleed   . Eyelid abnormality 11/30/2015  . Osteoarthritis, hand 10/05/2015  . Rash 03/29/2015  . Atrial fibrillation ([place]) 02/02/2015  . Fibromyalgia 01/25/2015  . Myalgia 12/08/2014  . Helicobacter pylori ab+ 05/19/2014  . Dehydration   . Enteritis   . Abdominal pain, generalized   . Malnutrition of moderate degree ([place]) 05/10/2014  . Chronic hyponatremia 05/09/2014  . Gastroenteritis 05/09/2014  . Guaiac positive stools 05/08/2014  . Tachycardia 05/08/2014  . Occult blood positive stool 05/08/2014  . AKI (acute kidney injury) ([place]) 10/21/2013  . Acute on chronic renal failure ([place]) 10/21/2013  . UTI (lower urinary tract infection) 10/20/2013  .  ARF (acute renal failure) ([place]) 10/20/2013  . Gastric AVM 10/10/2013  . Acute blood loss anemia 09/23/2013  . Dark stools 09/21/2013  . Anxiety state 04/20/2013  . Cervical spine arthritis 03/11/2013  . Neck pain on left side 02/15/2013  . Lump in neck 09/23/2012  . Symptomatic anemia 09/17/2012  . Other malaise and fatigue 09/14/2012  . Dizziness 09/14/2012  . Leg cramp 09/14/2012  . Spinal stenosis of lumbar region 07/16/2012  . Osteoporosis 04/27/2012  . Coronary Artery Disease 04/24/2012  . NSTEMI (non-ST elevated myocardial infarction) ([place]) 04/23/2012  . Preventative health care 01/08/2012  . Diarrhea 10/07/2011  . CKD (chronic kidney disease) stage 4, GFR 15-29 ml/min (HCC) 09/30/2011  . Unspecified disorder of liver 10/22/2010  . Menopausal  state 10/22/2010  . Schatzki's ring 10/22/2010  . Hip pain, left 07/23/2010  . PSVT 05/03/2009  . OSTEOARTHRITIS 02/19/2009  . ASYMPTOMATIC POSTMENOPAUSAL STATUS 07/17/2008  . LEUKOPENIA, MILD 05/30/2008  . SKIN RASH 07/02/2007  . Hyperglycemia 07/02/2007  . Hyperlipidemia 03/09/2007  . Iron deficiency anemia 03/09/2007  . MITRAL VALVE PROLAPSE 03/09/2007  . Allergic rhinitis 03/09/2007  . COPD (chronic obstructive pulmonary disease) ([place]) 03/09/2007  . History of colonic polyps 03/09/2007  . Asthma 01/18/2007  . Cough 01/18/2007  . Essential hypertension 11/12/2006    Past Surgical History:  Procedure Laterality Date  . ABDOMINAL HYSTERECTOMY    . CARDIAC CATHETERIZATION  1992   S/P in 1992 at [facility] in [place]. This was negative for any coronary artery disease  . carotid duplex  08/29/2003  . CATARACT EXTRACTION    . ceasarean    . ENTEROSCOPY N/A 10/10/2013   Procedure: ENTEROSCOPY;  Surgeon: Inda Castle, MD;  Location: [facility] ENDOSCOPY;  Service: Endoscopy;  Laterality: N/A;  . ENTEROSCOPY N/A 12/28/2014   Procedure: ENTEROSCOPY;  Surgeon: Inda Castle, MD;  Location: [facility] ENDOSCOPY;  Service: Endoscopy;  Laterality: N/A;  . ENTEROSCOPY N/A 01/20/2016   Procedure: ENTEROSCOPY;  Surgeon: Manus Gunning, MD;  Location: [facility] ENDOSCOPY;  Service: Gastroenterology;  Laterality: N/A;  . HOT HEMOSTASIS N/A 12/28/2014   Procedure: HOT HEMOSTASIS (ARGON PLASMA COAGULATION/BICAP);  Surgeon: Inda Castle, MD;  Location: [facility];  Service: Endoscopy;  Laterality: N/A;  . LEFT HEART CATHETERIZATION WITH CORONARY ANGIOGRAM N/A 04/23/2012   Procedure: LEFT HEART CATHETERIZATION WITH CORONARY ANGIOGRAM;  Surgeon: Larey Dresser, MD;  Location: [facility] CATH LAB;  Service: Cardiovascular;  Laterality: N/A;  . TONSILLECTOMY    . TUBAL LIGATION       OB History   No obstetric history on file.     Family History  Problem Relation Age of Onset  .  Prostate cancer Brother   . Stomach cancer Mother   . Sarcoidosis Sister   . Colon cancer Brother   . Sarcoidosis Other   . CAD Neg Hx     Social History   Tobacco Use  . Smoking status: Never Smoker  . Smokeless tobacco: Never Used  Substance Use Topics  . Alcohol use: No  . Drug use: No    Home Medications Prior to Admission medications   Medication Sig Start Date End Date Taking? Authorizing Provider  acetaminophen (TYLENOL) 500 MG tablet Take 2 tablets (1,000 mg total) by mouth 2 (two) times daily. 10/05/15   Biagio Borg, MD  atorvastatin (LIPITOR) 20 MG tablet TAKE 1 TABLET BY MOUTH EVERY DAY 12/07/18   Biagio Borg, MD  ferrous sulfate 325 (65 FE)  MG tablet TAKE 1 TABLET BY MOUTH 2 TIMES DAILY WITH A MEAL. 04/06/19   Armbruster, Carlota Raspberry, MD  metoprolol succinate (TOPROL-XL) 25 MG 24 hr tablet Take 1.5 tablets (37.5 mg total) by mouth daily. 04/28/18 05/12/19  Sherren Mocha, MD  Multiple Vitamin (MULTIVITAMIN) tablet Take 1 tablet by mouth daily.    [provider]  nitroGLYCERIN (NITROSTAT) 0.4 MG SL tablet Place 1 tablet (0.4 mg total) under the tongue every 5 (five) minutes as needed for chest pain (maximum of 3 tablets). 12/13/18   Sherren Mocha, MD    Allergies    Doxycycline, Biaxin [clarithromycin], and Metronidazole  Review of Systems   Review of Systems  All other systems reviewed and are negative.   Physical Exam Updated Vital Signs BP (!) 129/97   Pulse 90   Temp 97.8 F (36.6 C) (Oral)   Resp 19   Ht 1.549 m (5\' 1" )   Wt 54.4 kg   SpO2 100%   BMI 22.67 kg/m   Physical Exam Vitals and nursing note reviewed.  Constitutional:      General: She is not in acute distress.    Appearance: She is well-developed.  HENT:     Head: Normocephalic and atraumatic.     Right Ear: External ear normal.     Left Ear: External ear normal.  Eyes:     General: No scleral icterus.       Right eye: No discharge.        Left eye: No discharge.      Conjunctiva/sclera: Conjunctivae normal.  Neck:     Trachea: No tracheal deviation.  Cardiovascular:     Rate and Rhythm: Normal rate and regular rhythm.  Pulmonary:     Effort: Pulmonary effort is normal. No respiratory distress.     Breath sounds: Normal breath sounds. No stridor. No wheezing or rales.  Abdominal:     General: Bowel sounds are normal. There is no distension.     Palpations: Abdomen is soft.     Tenderness: There is no abdominal tenderness. There is no guarding or rebound.  Musculoskeletal:        General: No tenderness.     Cervical back: Neck supple.  Skin:    General: Skin is warm and dry.     Findings: No rash.  Neurological:     Mental Status: She is alert.     Cranial Nerves: No cranial nerve deficit (no facial droop, extraocular movements intact, no slurred speech).     Sensory: No sensory deficit.     Motor: No abnormal muscle tone or seizure activity.     Coordination: Coordination normal.     ED Results / Procedures / Treatments   Labs (all labs ordered are listed, but only abnormal results are displayed) Labs Reviewed  BASIC METABOLIC PANEL - Abnormal; Notable for the following components:      Result Value   CO2 21 (*)    Glucose, Bld 148 (*)    Creatinine, Ser 1.21 (*)    GFR calc non Af Amer 41 (*)    GFR calc Af Amer 48 (*)    All other components within normal limits  CBC - Abnormal; Notable for the following components:   MCV 112.3 (*)    All other components within normal limits  MAGNESIUM    EKG EKG Interpretation  Date/Time:  Wednesday June 01 2019 11:26:11 EST Ventricular Rate:  103 PR Interval:    QRS Duration: 98 QT  Interval:  357 QTC Calculation: 468 R Axis:   86 Text Interpretation: Sinus tachycardia Borderline right axis deviation Nonspecific T abnormalities, anterior leads Since last tracing rate faster Confirmed by Dorie Rank [phone]) on 06/01/2019 11:30:45 AM     Radiology DG Chest 2 View  Result Date:  06/01/2019 CLINICAL DATA:  Palpitations. Weakness. EXAM: CHEST - 2 VIEW COMPARISON:  01/19/2016 FINDINGS: Mild chronic cardiomegaly. Pulmonary vascularity is normal. The lungs are clear. No effusions. Slight chronic accentuation of the upper thoracic kyphosis. IMPRESSION: No acute abnormality. Mild chronic cardiomegaly. Electronically Signed   By: Lorriane Shire M.D.   On: 06/01/2019 13:08    Procedures Procedures (including critical care time)  Medications Ordered in ED Medications  sodium chloride flush (NS) 0.9 % injection 3 mL (3 mLs Intravenous Not Given 06/01/19 1334)    ED Course  I have reviewed the triage vital signs and the nursing notes.  Pertinent labs & imaging results that were available during my care of the patient were reviewed by me and considered in my medical decision making (see chart for details).  Clinical Course as of Jun 01 1410  Wed Jun 01, 2019  1314 Chest x-ray without acute abnormalities.   [JK]  1314 Electrolyte panel with creatinine increased compared to previous   [JK]  1407 Patient has been monitored in the ED for few hours.  She has no recurrent episodes of tachycardia.  Vital signs are stable.  She is in a sinus rhythm.   [EQ]  6834   She has been able to walk around the ED without difficulty.   [JK]    Clinical Course User Index [JK] Dorie Rank, MD   MDM Rules/Calculators/A&P                      Patient presented to the ED for evaluation of tachycardia.  Patient was noted to be in rapid atrial fibrillation when evaluated by EMS.  She was given a dose of Cardizem.  Fortunately the patient's tachycardia resolved and she went back into sinus rhythm.  She was monitored in the ED.  No recurrent episodes.  Patient does have a history of atrial fibrillation.  CHA2DS2-VASc Score = 5 however she is not on anticoagulation because of prior GI bleeds. The patient's score is based upon: CHF History: No HTN History: Yes Age : 70 + Diabetes History: No Stroke  History: No Vascular Disease History: Yes Gender: Female {Patient appears stable for discharge at this time.  Discussed outpatient follow-up with her cardiologist, Dr. Burt Knack. Patient appears stable for discharge.  Discussed close outpatient follow-up with cardiology.  Final Clinical Impression(s) / ED Diagnoses Final diagnoses:  Atrial fibrillation with rapid ventricular response [facility])    Rx / DC Orders ED Discharge Orders    None       Dorie Rank, MD 06/01/19 [phone]

## 2019-06-01 NOTE — ED Notes (Signed)
Patient ambulated with no issues,

## 2019-06-01 NOTE — Discharge Instructions (Signed)
Continue your current medications.  Follow-up with Dr. Burt Knack as we discussed.  Return to the ED as needed for recurrent symptoms

## 2019-06-01 NOTE — ED Notes (Signed)
Taken to XRAY

## 2019-06-01 NOTE — ED Triage Notes (Signed)
Came in via EMS from home; c/o palpitations that caused some weakness, EMS reported initial HR of 230s; Cardizem 10 mg IV given and appeared to slow down rate to 120s; patient was alert and oriented x 4 throughout.

## 2019-06-01 NOTE — Telephone Encounter (Signed)
Patient on ED report. Reached out to patient to schedule follow-up appointment. Left voicemail.

## 2019-06-02 ENCOUNTER — Ambulatory Visit: Payer: Medicare Other

## 2019-06-06 ENCOUNTER — Ambulatory Visit: Payer: Medicare Other | Attending: Internal Medicine

## 2019-06-06 DIAGNOSIS — Z23 Encounter for immunization: Secondary | ICD-10-CM

## 2019-06-06 NOTE — Progress Notes (Signed)
   Covid-19 Vaccination Clinic  Name:  Lindsay Doyle. [name]    MRN: [id number] DOB: [birth year]  06/06/2019  Lindsay Doyle was observed post Covid-19 immunization for 15 minutes without incident. She was provided with Vaccine Information Sheet and instruction to access the V-Safe system.   Lindsay Doyle was instructed to call 911 with any severe reactions post vaccine: [name] Difficulty breathing  . Swelling of face and throat  . A fast heartbeat  . A bad rash all over body  . Dizziness and weakness   Immunizations Administered    Name Date Dose VIS Date Route   Pfizer COVID-19 Vaccine 06/06/2019 10:19 AM 0.3 mL 03/11/2019 Intramuscular   Manufacturer: [place]   Lot: [id number]   [place]: [id number]

## 2019-06-07 NOTE — Progress Notes (Signed)
Cardiology Office Note    Date:  06/08/2019   ID:  Lindsay Doyle, Lindsay Doyle [birth year], MRN [id number]  PCP:  Biagio Borg, MD  Cardiologist: Dr. Burt Knack  Chief Complaint: ER follow up  History of Present Illness:   Lindsay Doyle is a 86 y.o. female PAF, HTN, HLD and GI bleed seen for follow up.   The patient had atrial fibrillation in 2014 associated with elevated troponin.  Cardiac catheterization demonstrated mild nonobstructive coronary disease.  The patient has had significant GI bleeding secondary to AVMs and she is not able to tolerate anticoagulant drugs.  She was maintaining sinus rhythm when last seen by Dr. Burt Knack 05/12/19.   Seen in ER 06/01/19 for rapid afib. Converted in sinus with IV cardizem, CHADSVASCs score of 5. Not on anticoagulated due to prior GI bleed.   Here today for follow up.  No recurrent atrial fibrillation episode.  She exercise 5 days/week.  Denies chest pain, shortness of breath, orthopnea, PND, lower extremity edema, dizziness or syncope.  Compliant with medication and diet.   Past Medical History:  Diagnosis Date  . ANEMIA-IRON DEFICIENCY    presumed GI bleed 08/2012- anticoag stopped  . Atrial fibrillation ([place])    a. Apixaban started 03/2012 - stopped after admx for GI bleed 6/14  . Blood transfusion    a. 05/2010: 3 transfusions for anemia/GIB.  [name] CAD (coronary artery disease)    a. LHC 1/14:  mLAD serial 30 and 60%, oRCA 30%, EF 60%, Afib,   . Cataract   . COLONIC POLYPS, HX OF 03/09/2007  . COPD 03/09/2007   ? pt is unsure  . GERD (gastroesophageal reflux disease)   . GI bleed    thought to be due to AVMs  . Hx of echocardiogram    a. echo 2/11:  EF 55-60%, mild AI, mild RAE, mild to mod TR;   b.  a. Echo 2/14:  Mild focal basal and mild LVH, EF 60-65%, mild AI, mild RAE, trivia effusion  . HYPERGLYCEMIA 07/02/2007  . Hyperlipidemia   . Hypertension 11/12/2006  . LEUKOPENIA, MILD 05/30/2008  . Mitral valve prolapse    Remotely - last echo  2011 did not show this  . NSTEMI (non-ST elevated myocardial infarction) ([place]) 03/2012   a. Type II in setting of AF with RVR 03/2012  . OSTEOARTHRITIS 02/19/2009   Spinal  . OSTEOPOROSIS 11/12/2006  . Positive H. pylori test 10/1996  . PSVT    a. Remotely, in setting of anemia.  [name] Spinal stenosis of lumbar region 07/16/2012  . Transfusion history    7/15     Past Surgical History:  Procedure Laterality Date  . ABDOMINAL HYSTERECTOMY    . CARDIAC CATHETERIZATION  1992   S/P in [facility] at [facility] in [place]. This was negative for any coronary artery disease  . carotid duplex  08/29/2003  . CATARACT EXTRACTION    . ceasarean    . ENTEROSCOPY N/A 10/10/2013   Procedure: ENTEROSCOPY;  Surgeon: Inda Castle, MD;  Location: [facility] ENDOSCOPY;  Service: Endoscopy;  Laterality: N/A;  . ENTEROSCOPY N/A 12/28/2014   Procedure: ENTEROSCOPY;  Surgeon: Inda Castle, MD;  Location: [facility] ENDOSCOPY;  Service: Endoscopy;  Laterality: N/A;  . ENTEROSCOPY N/A 01/20/2016   Procedure: ENTEROSCOPY;  Surgeon: Manus Gunning, MD;  Location: [facility] ENDOSCOPY;  Service: Gastroenterology;  Laterality: N/A;  . HOT HEMOSTASIS N/A 12/28/2014   Procedure: HOT HEMOSTASIS (ARGON PLASMA COAGULATION/BICAP);  Surgeon: Inda Castle, MD;  Location: [facility];  Service: Endoscopy;  Laterality: N/A;  . LEFT HEART CATHETERIZATION WITH CORONARY ANGIOGRAM N/A 04/23/2012   Procedure: LEFT HEART CATHETERIZATION WITH CORONARY ANGIOGRAM;  Surgeon: Larey Dresser, MD;  Location[facility] CATH LAB;  Service: Cardiovascular;  Laterality: N/A;  . TONSILLECTOMY    . TUBAL LIGATION      Current Medications: Prior to Admission medications   Medication Sig Start Date End Date Taking? Authorizing Provider  acetaminophen (TYLENOL) 500 MG tablet Take 2 tablets (1,000 mg total) by mouth 2 (two) times daily. 10/05/15   Biagio Borg, MD  atorvastatin (LIPITOR) 20 MG tablet TAKE 1 TABLET BY MOUTH EVERY DAY 12/07/18    Biagio Borg, MD  ferrous sulfate 325 (65 FE) MG tablet TAKE 1 TABLET BY MOUTH 2 TIMES DAILY WITH A MEAL. 04/06/19   Armbruster, Carlota Raspberry, MD  metoprolol succinate (TOPROL-XL) 25 MG 24 hr tablet Take 1.5 tablets (37.5 mg total) by mouth daily. 04/28/18 05/12/19  Sherren Mocha, MD  Multiple Vitamin (MULTIVITAMIN) tablet Take 1 tablet by mouth daily.    [provider]  nitroGLYCERIN (NITROSTAT) 0.4 MG SL tablet Place 1 tablet (0.4 mg total) under the tongue every 5 (five) minutes as needed for chest pain (maximum of 3 tablets). 12/13/18   Sherren Mocha, MD    Allergies:   Doxycycline, Biaxin [clarithromycin], and Metronidazole   Social History   Socioeconomic History  . Marital status: Married    Spouse name: Not on file  . Number of children: 1  . Years of education: Not on file  . Highest education level: Not on file  Occupational History  . Occupation: retired    Comment: retired  Tobacco Use  . Smoking status: Never Smoker  . Smokeless tobacco: Never Used  Substance and Sexual Activity  . Alcohol use: No  . Drug use: No  . Sexual activity: Never  Other Topics Concern  . Not on file  Social History Narrative  . Not on file   Social Determinants of Health   Financial Resource Strain:   . Difficulty of Paying Living Expenses: Not on file  Food Insecurity:   . Worried About Charity fundraiser in the Last Year: Not on file  . Ran Out of Food in the Last Year: Not on file  Transportation Needs:   . Lack of Transportation (Medical): Not on file  . Lack of Transportation (Non-Medical): Not on file  Physical Activity:   . Days of Exercise per Week: Not on file  . Minutes of Exercise per Session: Not on file  Stress:   . Feeling of Stress : Not on file  Social Connections:   . Frequency of Communication with Friends and Family: Not on file  . Frequency of Social Gatherings with Friends and Family: Not on file  . Attends Religious Services: Not on file  . Active  Member of Clubs or Organizations: Not on file  . Attends Archivist Meetings: Not on file  . Marital Status: Not on file     Family History:  The patient's family history includes Colon cancer in her brother; Prostate cancer in her brother; Sarcoidosis in her sister and another family member; Stomach cancer in her mother.   ROS:   Please see the history of present illness.    ROS All other systems reviewed and are negative.   PHYSICAL EXAM:   VS:  BP (!) 146/82   Pulse 93  Ht 5\' 1"  (1.549 m)   Wt 112 lb 1.9 oz (50.9 kg)   SpO2 100%   BMI 21.18 kg/m    GEN: Well nourished, well developed, in no acute distress  HEENT: normal  Neck: no JVD, carotid bruits, or masses Cardiac: RRR; no murmurs, rubs, or gallops,no edema  Respiratory:  clear to auscultation bilaterally, normal work of breathing GI: soft, nontender, nondistended, + BS MS: no deformity or atrophy  Skin: warm and dry, no rash Neuro:  Alert and Oriented x 3, Strength and sensation are intact Psych: euthymic mood, full affect  Wt Readings from Last 3 Encounters:  06/08/19 112 lb 1.9 oz (50.9 kg)  06/01/19 120 lb (54.4 kg)  05/12/19 112 lb (50.8 kg)      Studies/Labs Reviewed:   EKG:  EKG is not ordered today.    Recent Labs: 09/16/2018: TSH 2.20 03/18/2019: ALT 11 06/01/2019: BUN 18; Creatinine, Ser 1.21; Hemoglobin 13.3; Magnesium 2.3; Platelets 256; Potassium 4.5; Sodium 136   Lipid Panel    Component Value Date/Time   CHOL 204 (H) 03/18/2019 1137   TRIG 212.0 (H) 03/18/2019 1137   HDL 68.30 03/18/2019 1137   CHOLHDL 3 03/18/2019 1137   VLDL 42.4 (H) 03/18/2019 1137   LDLCALC 157 (H) 09/16/2018 1020   LDLDIRECT 106.0 03/18/2019 1137    Additional studies/ records that were reviewed today include:   Echocardiogram: 01/20/16 Left ventricle: The cavity size was normal. Wall thickness was  increased in a pattern of mild LVH. Systolic function was  vigorous. The estimated ejection fraction  was in the range of 65%  to 70%. Wall motion was normal; there were no regional wall  motion abnormalities. Doppler parameters are consistent with  abnormal left ventricular relaxation (grade 1 diastolic  dysfunction).  - Aortic valve: There was mild regurgitation.  - Tricuspid valve: There was moderate regurgitation.  - Pulmonary arteries: Systolic pressure was mildly increased. PA  peak pressure: 36 mm Hg (S).  - Pericardium, extracardiac: A trivial pericardial effusion was  identified.   ASSESSMENT & PLAN:    1. PAF CHADSVASCs score of 5. Not on anticoagulated due to prior GI bleed.  Sinus rhythm by exam.  Increase Toprol-XL to 50 mg daily.  Declined metoprolol tartrate for as needed palpitation.  She will let us know if recurrent episodes.  2. HTN Minimally elevated.  Increase beta-blocker as above.  3.  Hyperlipidemia -Continue statin.   Medication Adjustments/Labs and Tests Ordered: Current medicines are reviewed at length with the patient today.  Concerns regarding medicines are outlined above.  Medication changes, Labs and Tests ordered today are listed in the Patient Instructions below. Patient Instructions  Medication Instructions:  Your physician has recommended you make the following change in your medication:  1.  INCREASE the Toprol Xl to 50 mg taking 1 daily   *If you need a refill on your cardiac medications before your next appointment, please call your pharmacy*   Lab Work: None ordered  If you have labs (blood work) drawn today and your tests are completely normal, you will receive your results only by: [name] MyChart Message (if you have MyChart) OR . A paper copy in the mail If you have any lab test that is abnormal or we need to change your treatment, we will call you to review the results.   Testing/Procedures: None ordered   Follow-Up: At [facility], you and your health needs are our priority.  As part of our continuing mission to  provide you with exceptional heart care, we have created designated Provider Care Teams.  These Care Teams include your primary Cardiologist (physician) and Advanced Practice Providers (APPs -  Physician Assistants and Nurse Practitioners) who all work together to provide you with the care you need, when you need it.  We recommend signing up for the patient portal called "MyChart".  Sign up information is provided on this After Visit Summary.  MyChart is used to connect with patients for Virtual Visits (Telemedicine).  Patients are able to view lab/test results, encounter notes, upcoming appointments, etc.  Non-urgent messages can be sent to your provider as well.   To learn more about what you can do with MyChart, go to [email or web address].    Your next appointment:   12 month(s)  The format for your next appointment:   In Person  Provider:   You may see Sherren Mocha, MD or one of the following Advanced Practice Providers on your designated Care Team:    [name], PA-C  [place], [place]  Daune Perch, NP    Other Instructions      Jarrett Soho, [place]  06/08/2019 11:32 AM    [address], [place], [place]  [address] Phone: [phone]; Fax: [phone]

## 2019-06-08 ENCOUNTER — Other Ambulatory Visit: Payer: Self-pay

## 2019-06-08 ENCOUNTER — Ambulatory Visit (INDEPENDENT_AMBULATORY_CARE_PROVIDER_SITE_OTHER): Payer: Medicare Other | Admitting: Physician Assistant

## 2019-06-08 ENCOUNTER — Encounter: Payer: Self-pay | Admitting: Physician Assistant

## 2019-06-08 VITALS — BP 146/82 | HR 93 | Ht 61.0 in | Wt 112.1 lb

## 2019-06-08 DIAGNOSIS — E782 Mixed hyperlipidemia: Secondary | ICD-10-CM

## 2019-06-08 DIAGNOSIS — I10 Essential (primary) hypertension: Secondary | ICD-10-CM | POA: Diagnosis not present

## 2019-06-08 DIAGNOSIS — I480 Paroxysmal atrial fibrillation: Secondary | ICD-10-CM

## 2019-06-08 MED ORDER — METOPROLOL SUCCINATE ER 50 MG PO TB24: 50.0000 mg | ORAL_TABLET | Freq: Every day | ORAL | 3 refills | Status: DC

## 2019-06-08 NOTE — Patient Instructions (Signed)
Medication Instructions:  Your physician has recommended you make the following change in your medication:  1.  INCREASE the Toprol Xl to 50 mg taking 1 daily   *If you need a refill on your cardiac medications before your next appointment, please call your pharmacy*   Lab Work: None ordered  If you have labs (blood work) drawn today and your tests are completely normal, you will receive your results only by: [name] MyChart Message (if you have MyChart) OR . A paper copy in the mail If you have any lab test that is abnormal or we need to change your treatment, we will call you to review the results.   Testing/Procedures: None ordered   Follow-Up: At [facility], you and your health needs are our priority.  As part of our continuing mission to provide you with exceptional heart care, we have created designated Provider Care Teams.  These Care Teams include your primary Cardiologist (physician) and Advanced Practice Providers (APPs -  Physician Assistants and Nurse Practitioners) who all work together to provide you with the care you need, when you need it.  We recommend signing up for the patient portal called "MyChart".  Sign up information is provided on this After Visit Summary.  MyChart is used to connect with patients for Virtual Visits (Telemedicine).  Patients are able to view lab/test results, encounter notes, upcoming appointments, etc.  Non-urgent messages can be sent to your provider as well.   To learn more about what you can do with MyChart, go to [email or web address].    Your next appointment:   12 month(s)  The format for your next appointment:   In Person  Provider:   You may see Sherren Mocha, MD or one of the following Advanced Practice Providers on your designated Care Team:    [name], PA-C  [place], [place]  Daune Perch, NP    Other Instructions

## 2019-06-13 ENCOUNTER — Ambulatory Visit: Payer: Medicare Other | Admitting: Cardiovascular Disease

## 2019-06-15 ENCOUNTER — Other Ambulatory Visit: Payer: Self-pay | Admitting: Internal Medicine

## 2019-06-15 NOTE — Telephone Encounter (Signed)
Please refill as per office routine med refill policy (all routine meds refilled for 3 mo or monthly per pt preference up to one year from last visit, then month to month grace period for 3 mo, then further med refills will have to be denied)  

## 2019-06-22 DIAGNOSIS — I10 Essential (primary) hypertension: Secondary | ICD-10-CM | POA: Insufficient documentation

## 2019-06-22 DIAGNOSIS — Z01419 Encounter for gynecological examination (general) (routine) without abnormal findings: Secondary | ICD-10-CM | POA: Diagnosis not present

## 2019-06-22 DIAGNOSIS — Z6821 Body mass index (BMI) 21.0-21.9, adult: Secondary | ICD-10-CM | POA: Diagnosis not present

## 2019-06-29 ENCOUNTER — Other Ambulatory Visit: Payer: Self-pay | Admitting: Gastroenterology

## 2019-07-11 ENCOUNTER — Ambulatory Visit (INDEPENDENT_AMBULATORY_CARE_PROVIDER_SITE_OTHER): Payer: Medicare Other

## 2019-07-11 ENCOUNTER — Other Ambulatory Visit: Payer: Self-pay

## 2019-07-11 VITALS — BP 128/80 | HR 84 | Temp 98.2°F | Resp 16 | Ht 61.0 in | Wt 114.8 lb

## 2019-07-11 DIAGNOSIS — Z0000 Encounter for general adult medical examination without abnormal findings: Secondary | ICD-10-CM | POA: Diagnosis not present

## 2019-07-11 NOTE — Patient Instructions (Addendum)
Ms. Lindsay Doyle , Thank you for taking time to come for your Medicare Wellness Visit. I appreciate your ongoing commitment to your health goals. Please review the following plan we discussed and let me know if I can assist you in the future.   Screening recommendations/referrals: Colorectal Screening: last done 03/31/2010 Mammogram: last done 05/18/2014 Bone Density: last done 05/20/2016  Vision and Dental Exams: Recommended annual ophthalmology exams for early detection of glaucoma and other disorders of the eye Recommended annual dental exams for proper oral hygiene  Vaccinations: Influenza vaccine: last done 12/23/2018 Pneumococcal vaccine: completed Tdap vaccine: 07/17/2008; Done every 10 years; Please call your insurance company to determine your out of pocket expense. You may also receive this vaccine at your local pharmacy or Health Dept. Shingles vaccine: Please call your insurance company to determine your out of pocket expense for the Shingrix vaccine. You may receive this vaccine at your local pharmacy. Covid vaccine: completed   Advanced directives: Advance directives discussed with you today. Please bring a copy of your POA (Power of [place]) and/or Living Will to your next appointment.  Goals: Recommend to drink at least 6-8 8oz glasses of water per day.[id number].  Recommend to exercise for at least 150 minutes per week.  Recommend to remove any items from the home that may cause slips or trips.  Recommend to decrease portion sizes by eating 3 small healthy meals and at least 2 healthy snacks per day.  Recommend to begin DASH diet as directed below  Next appointment: Please schedule your Annual Wellness Visit with your Nurse Health Advisor in one year.  Preventive Care 70 Years and Older, Female Preventive care refers to lifestyle choices and visits with your health care provider that can promote health and wellness. What does preventive care include?  A yearly physical exam.  This is also called an annual well check.  Dental exams once or twice a year.  Routine eye exams. Ask your health care provider how often you should have your eyes checked.  Personal lifestyle choices, including:  Daily care of your teeth and gums.  Regular physical activity.  Eating a healthy diet.  Avoiding tobacco and drug use.  Limiting alcohol use.  Practicing safe sex.  Taking low-dose aspirin every day if recommended by your health care provider.  Taking vitamin and mineral supplements as recommended by your health care provider. What happens during an annual well check? The services and screenings done by your health care provider during your annual well check will depend on your age, overall health, lifestyle risk factors, and family history of disease. Counseling  Your health care provider may ask you questions about your:  Alcohol use.  Tobacco use.  Drug use.  Emotional well-being.  Home and relationship well-being.  Sexual activity.  Eating habits.  History of falls.  Memory and ability to understand (cognition).  Work and work Statistician.  Reproductive health. Screening  You may have the following tests or measurements:  Height, weight, and BMI.  Blood pressure.  Lipid and cholesterol levels. These may be checked every 5 years, or more frequently if you are over 75 years old.  Skin check.  Lung cancer screening. You may have this screening every year starting at age 81 if you have a 30-pack-year history of smoking and currently smoke or have quit within the past 15 years.  Fecal occult blood test (FOBT) of the stool. You may have this test every year starting at age 102.  Flexible sigmoidoscopy or colonoscopy.  You may have a sigmoidoscopy every 5 years or a colonoscopy every 10 years starting at age 74.  Hepatitis C blood test.  Hepatitis B blood test.  Sexually transmitted disease (STD) testing.  Diabetes screening. This is done  by checking your blood sugar (glucose) after you have not eaten for a while (fasting). You may have this done every 1-3 years.  Bone density scan. This is done to screen for osteoporosis. You may have this done starting at age 57.  Mammogram. This may be done every 1-2 years. Talk to your health care provider about how often you should have regular mammograms. Talk with your health care provider about your test results, treatment options, and if necessary, the need for more tests. Vaccines  Your health care provider may recommend certain vaccines, such as:  Influenza vaccine. This is recommended every year.  Tetanus, diphtheria, and acellular pertussis (Tdap, Td) vaccine. You may need a Td booster every 10 years.  Zoster vaccine. You may need this after age 31.  Pneumococcal 13-valent conjugate (PCV13) vaccine. One dose is recommended after age 26.  Pneumococcal polysaccharide (PPSV23) vaccine. One dose is recommended after age 4. Talk to your health care provider about which screenings and vaccines you need and how often you need them. This information is not intended to replace advice given to you by your health care provider. Make sure you discuss any questions you have with your health care provider. Document Released: 04/13/2015 Document Revised: 12/05/2015 Document Reviewed: 01/16/2015 Elsevier Interactive Patient Education  2017 [employer] Prevention in the Home Falls can cause injuries. They can happen to people of all ages. There are many things you can do to make your home safe and to help prevent falls. What can I do on the outside of my home?  Regularly fix the edges of walkways and driveways and fix any cracks.  Remove anything that might make you trip as you walk through a door, such as a raised step or threshold.  Trim any bushes or trees on the path to your home.  Use bright outdoor lighting.  Clear any walking paths of anything that might make someone  trip, such as rocks or tools.  Regularly check to see if handrails are loose or broken. Make sure that both sides of any steps have handrails.  Any raised decks and porches should have guardrails on the edges.  Have any leaves, snow, or ice cleared regularly.  Use sand or salt on walking paths during winter.  Clean up any spills in your garage right away. This includes oil or grease spills. What can I do in the bathroom?  Use night lights.  Install grab bars by the toilet and in the tub and shower. Do not use towel bars as grab bars.  Use non-skid mats or decals in the tub or shower.  If you need to sit down in the shower, use a plastic, non-slip stool.  Keep the floor dry. Clean up any water that spills on the floor as soon as it happens.  Remove soap buildup in the tub or shower regularly.  Attach bath mats securely with double-sided non-slip rug tape.  Do not have throw rugs and other things on the floor that can make you trip. What can I do in the bedroom?  Use night lights.  Make sure that you have a light by your bed that is easy to reach.  Do not use any sheets or blankets that are too big  for your bed. They should not hang down onto the floor.  Have a firm chair that has side arms. You can use this for support while you get dressed.  Do not have throw rugs and other things on the floor that can make you trip. What can I do in the kitchen?  Clean up any spills right away.  Avoid walking on wet floors.  Keep items that you use a lot in easy-to-reach places.  If you need to reach something above you, use a strong step stool that has a grab bar.  Keep electrical cords out of the way.  Do not use floor polish or wax that makes floors slippery. If you must use wax, use non-skid floor wax.  Do not have throw rugs and other things on the floor that can make you trip. What can I do with my stairs?  Do not leave any items on the stairs.  Make sure that there  are handrails on both sides of the stairs and use them. Fix handrails that are broken or loose. Make sure that handrails are as long as the stairways.  Check any carpeting to make sure that it is firmly attached to the stairs. Fix any carpet that is loose or worn.  Avoid having throw rugs at the top or bottom of the stairs. If you do have throw rugs, attach them to the floor with carpet tape.  Make sure that you have a light switch at the top of the stairs and the bottom of the stairs. If you do not have them, ask someone to add them for you. What else can I do to help prevent falls?  Wear shoes that:  Do not have high heels.  Have rubber bottoms.  Are comfortable and fit you well.  Are closed at the toe. Do not wear sandals.  If you use a stepladder:  Make sure that it is fully opened. Do not climb a closed stepladder.  Make sure that both sides of the stepladder are locked into place.  Ask someone to hold it for you, if possible.  Clearly mark and make sure that you can see:  Any grab bars or handrails.  First and last steps.  Where the edge of each step is.  Use tools that help you move around (mobility aids) if they are needed. These include:  Canes.  Walkers.  Scooters.  Crutches.  Turn on the lights when you go into a dark area. Replace any light bulbs as soon as they burn out.  Set up your furniture so you have a clear path. Avoid moving your furniture around.  If any of your floors are uneven, fix them.  If there are any pets around you, be aware of where they are.  Review your medicines with your doctor. Some medicines can make you feel dizzy. This can increase your chance of falling. Ask your doctor what other things that you can do to help prevent falls. This information is not intended to replace advice given to you by your health care provider. Make sure you discuss any questions you have with your health care provider. Document Released:  01/11/2009 Document Revised: 08/23/2015 Document Reviewed: 04/21/2014 Elsevier Interactive Patient Education  2017 [employer].

## 2019-07-11 NOTE — Progress Notes (Addendum)
Subjective:   Lindsay Doyle is a 86 y.o. female who presents for Medicare Annual (Subsequent) preventive examination.  Review of Systems:  Medicare Wellness Visit. Cardiac Risk Factors include: advanced age (>48men, >34 women);dyslipidemia;hypertension  Sleep Patterns: No issues with falling sleep;feels rested on waking and sleeps 6-8 hours nightly. Home Safety/Smoke Alarms: Feels safe in home; Smoke alarms in place. Living environment: 1-story home; lives with her husband. Seat Belt Safety/Bike Helmet: Wears seat belt.     Objective:     Vitals: BP 128/80 (BP Location: Left Arm, Patient Position: Sitting, Cuff Size: Small)   Pulse 84   Temp 98.2 F (36.8 C)   Resp 16   Ht 5\' 1"  (1.549 m)   Wt 114 lb 12.8 oz (52.1 kg)   SpO2 98%   BMI 21.69 kg/m   Body mass index is 21.69 kg/m.  Advanced Directives 07/11/2019 06/01/2019 05/31/2018 05/26/2017 05/20/2016 01/19/2016 01/19/2016  Does Patient Have a Medical Advance Directive? Yes No Yes Yes Yes No No  Type of [employer] [place];Living will [place];Living will [place];Living will - -  Does patient want to make changes to medical advance directive? No - Patient declined - - - - - -  Copy of [place] in Chart? No - copy requested - No - copy requested No - copy requested No - copy requested - -  Would patient like information on creating a medical advance directive? - No - Patient declined - - - No - patient declined information -    Tobacco Social History   Tobacco Use  Smoking Status Never Smoker  Smokeless Tobacco Never Used     Counseling given: No   Clinical Intake:  Pre-visit preparation completed: Yes  Pain : No/denies pain Pain Score: 0-No pain     Nutritional Status: BMI of 19-24  Normal Nutritional Risks: None Diabetes: No  How often do you need to have someone help you when you read  instructions, pamphlets, or other written materials from your doctor or pharmacy?: 1 - Never What is the last grade level you completed in school?: [employer]  Interpreter Needed?: No  Information entered by :: Marlicia Sroka N. Lowell Guitar, LPN  Past Medical History:  Diagnosis Date  . ANEMIA-IRON DEFICIENCY    presumed GI bleed 08/2012- anticoag stopped  . Atrial fibrillation ([place])    a. Apixaban started 03/2012 - stopped after admx for GI bleed 6/14  . Blood transfusion    a. 05/2010: 3 transfusions for anemia/GIB.  [name] CAD (coronary artery disease)    a. LHC 1/14:  mLAD serial 30 and 60%, oRCA 30%, EF 60%, Afib,   . Cataract   . COLONIC POLYPS, HX OF 03/09/2007  . COPD 03/09/2007   ? pt is unsure  . GERD (gastroesophageal reflux disease)   . GI bleed    thought to be due to AVMs  . Hx of echocardiogram    a. echo 2/11:  EF 55-60%, mild AI, mild RAE, mild to mod TR;   b.  a. Echo 2/14:  Mild focal basal and mild LVH, EF 60-65%, mild AI, mild RAE, trivia effusion  . HYPERGLYCEMIA 07/02/2007  . Hyperlipidemia   . Hypertension 11/12/2006  . LEUKOPENIA, MILD 05/30/2008  . Mitral valve prolapse    Remotely - last echo 2011 did not show this  . NSTEMI (non-ST elevated myocardial infarction) ([place]) 03/2012   a. Type  II in setting of AF with RVR 03/2012  . OSTEOARTHRITIS 02/19/2009   Spinal  . OSTEOPOROSIS 11/12/2006  . Positive H. pylori test 10/1996  . PSVT    a. Remotely, in setting of anemia.  [name] Spinal stenosis of lumbar region 07/16/2012  . Transfusion history    7/15    Past Surgical History:  Procedure Laterality Date  . ABDOMINAL HYSTERECTOMY    . CARDIAC CATHETERIZATION  1992   S/P in 1992 at [facility] in [place]. This was negative for any coronary artery disease  . carotid duplex  08/29/2003  . CATARACT EXTRACTION    . ceasarean    . ENTEROSCOPY N/A 10/10/2013   Procedure: ENTEROSCOPY;  Surgeon: Inda Castle, MD;  Location: [facility] ENDOSCOPY;  Service:  Endoscopy;  Laterality: N/A;  . ENTEROSCOPY N/A 12/28/2014   Procedure: ENTEROSCOPY;  Surgeon: Inda Castle, MD;  Location: [facility] ENDOSCOPY;  Service: Endoscopy;  Laterality: N/A;  . ENTEROSCOPY N/A 01/20/2016   Procedure: ENTEROSCOPY;  Surgeon: Manus Gunning, MD;  Location[facility] ENDOSCOPY;  Service: Gastroenterology;  Laterality: N/A;  . HOT HEMOSTASIS N/A 12/28/2014   Procedure: HOT HEMOSTASIS (ARGON PLASMA COAGULATION/BICAP);  Surgeon: Inda Castle, MD;  Location: [facility];  Service: Endoscopy;  Laterality: N/A;  . LEFT HEART CATHETERIZATION WITH CORONARY ANGIOGRAM N/A 04/23/2012   Procedure: LEFT HEART CATHETERIZATION WITH CORONARY ANGIOGRAM;  Surgeon: Larey Dresser, MD;  Location: [facility] CATH LAB;  Service: Cardiovascular;  Laterality: N/A;  . TONSILLECTOMY    . TUBAL LIGATION     Family History  Problem Relation Age of Onset  . Prostate cancer Brother   . Stomach cancer Mother   . Sarcoidosis Sister   . Colon cancer Brother   . Sarcoidosis Other   . CAD Neg Hx    Social History   Socioeconomic History  . Marital status: Married    Spouse name: Not on file  . Number of children: 1  . Years of education: Not on file  . Highest education level: Not on file  Occupational History  . Occupation: retired    Comment: retired  Tobacco Use  . Smoking status: Never Smoker  . Smokeless tobacco: Never Used  Substance and Sexual Activity  . Alcohol use: No  . Drug use: No  . Sexual activity: Never  Other Topics Concern  . Not on file  Social History Narrative  . Not on file   Social Determinants of Health   Financial Resource Strain:   . Difficulty of Paying Living Expenses:   Food Insecurity:   . Worried About Charity fundraiser in the Last Year:   . Arboriculturist in the Last Year:   Transportation Needs:   . Film/video editor (Medical):   [name] Lack of Transportation (Non-Medical):   Physical Activity:   . Days of Exercise per Week:   . Minutes of  Exercise per Session:   Stress:   . Feeling of Stress :   Social Connections:   . Frequency of Communication with Friends and Family:   . Frequency of Social Gatherings with Friends and Family:   . Attends Religious Services:   . Active Member of Clubs or Organizations:   . Attends Archivist Meetings:   [name] Marital Status:     Outpatient Encounter Medications as of 07/11/2019  Medication Sig  . acetaminophen (TYLENOL) 500 MG tablet Take 2 tablets (1,000 mg total) by mouth 2 (two) times  daily.  . atorvastatin (LIPITOR) 20 MG tablet TAKE 1 TABLET BY MOUTH EVERY DAY  . ferrous sulfate 325 (65 FE) MG tablet TAKE 1 TABLET BY MOUTH 2 TIMES DAILY WITH A MEAL.  . metoprolol succinate (TOPROL-XL) 50 MG 24 hr tablet Take 1 tablet (50 mg total) by mouth daily. Take with or immediately following a meal.  . Multiple Vitamin (MULTIVITAMIN) tablet Take 1 tablet by mouth daily.  . nitroGLYCERIN (NITROSTAT) 0.4 MG SL tablet Place 1 tablet (0.4 mg total) under the tongue every 5 (five) minutes as needed for chest pain (maximum of 3 tablets).   No facility-administered encounter medications on file as of 07/11/2019.    Activities of Daily Living   Patient Care Team: Biagio Borg, MD as PCP - General (Internal Medicine) Sherren Mocha, MD as PCP - Cardiology (Cardiology) Armbruster, Carlota Raspberry, MD as Consulting Physician (Gastroenterology)    Assessment:   This is a routine wellness examination for Lindsay Doyle.  Exercise Activities and Dietary recommendations Current Exercise Habits: Home exercise routine, Type of exercise: Other - see comments(exercise on tv), Time (Minutes): 60, Frequency (Times/Week): 5, Weekly Exercise (Minutes/Week): 300, Intensity: Moderate, Exercise limited by: None identified  Goals    . Patient Stated     Maintain current healthy status, stay as active and as independent as possible    . Patient Stated     Maintain my weight by increasing my lunch. Continue to  exercise.     . Patient Stated     To stay safe and survive this pandemic.    [name] wants to improve memory     Continue to work puzzles. I will try to do a puzzle per day.       Fall Risk Fall Risk  07/11/2019 09/16/2018 05/31/2018 03/12/2018 05/26/2017  Falls in the past year? 0 0 0 0 Yes  Number falls in past yr: 0 - - - 1  Injury with Fall? 0 - - - Yes  Comment - - - - -  Risk for fall due to : No Fall Risks - - - -  Follow up Falls evaluation completed;Education provided;Falls prevention discussed - - - -   Is the patient's home free of loose throw rugs in walkways, pet beds, electrical cords, etc?   yes      Grab bars in the bathroom? yes      Handrails on the stairs?   yes      Adequate lighting?   yes  Depression Screen PHQ 2/9 Scores 07/11/2019 09/16/2018 05/31/2018 03/12/2018  PHQ - 2 Score 0 1 0 1  PHQ- 9 Score - - - -     Cognitive Function MMSE - Mini Mental State Exam 05/31/2018 05/26/2017 05/20/2016  Orientation to time 5 5 5   Orientation to Place 5 5 5   Registration 3 3 3   Attention/ Calculation 5 5 5   Recall 3 1 1   Language- name 2 objects 2 2 2   Language- repeat 1 1 1   Language- follow 3 step command 3 3 3   Language- read & follow direction 1 1 1   Write a sentence 1 1 1   Copy design 1 1 1   Total score 30 28 28      6CIT Screen 07/11/2019  What Year? 0 points  What month? 0 points  What time? 0 points  Count back from 20 0 points  Months in reverse 0 points  Repeat phrase 2 points  Total Score 2    Immunization History  Administered Date(s) Administered  . Fluad Quad(high Dose 65+) 12/23/2018  . Influenza Split 03/10/2011, 01/08/2012  . Influenza Whole 02/07/2008, 02/05/2009, 02/18/2010  . Influenza, High Dose Seasonal PF 01/25/2013, 03/06/2015, 01/16/2017, 01/08/2018  . Influenza,inj,Quad PF,6+ Mos 01/25/2014, 01/03/2016  . PFIZER SARS-COV-2 Vaccination 05/14/2019, 06/06/2019  . Pneumococcal Conjugate-13 02/08/2013  . Pneumococcal Polysaccharide-23  12/29/1997, 01/08/2012  . Td 07/29/1996, 07/17/2008    Screening Tests Health Maintenance  Topic Date Due  . TETANUS/TDAP  07/18/2018  . INFLUENZA VACCINE  10/30/2019  . DEXA SCAN  Completed  . PNA vac Low Risk Adult  Completed    Cancer Screenings: Lung: Low Dose CT Chest recommended if Age 64-80 years, 30 pack-year currently smoking OR have quit w/in 15years. Patient does not qualify. Breast:  Up to date on Mammogram? Yes   Up to date of Bone Density/Dexa? Yes Colorectal: not recommended due to age     Plan:     Reviewed health maintenance screenings with patient today and relevant education, vaccines, and/or referrals were provided.    Continue doing brain stimulating activities (puzzles, reading, adult coloring books, staying active) to keep memory sharp.    Continue to eat heart healthy diet (full of fruits, vegetables, whole grains, lean protein, water--limit salt, fat, and sugar intake) and increase physical activity as tolerated.  I have personally reviewed and noted the following in the patient's chart:   . Medical and social history . Use of alcohol, tobacco or illicit drugs  . Current medications and supplements . Functional ability and status . Nutritional status . Physical activity . Advanced directives . List of other physicians . Hospitalizations, surgeries, and ER visits in previous 12 months . Vitals . Screenings to include cognitive, depression, and falls . Referrals and appointments  In addition, I have reviewed and discussed with patient certain preventive protocols, quality metrics, and best practice recommendations. A written personalized care plan for preventive services as well as general preventive health recommendations were provided to patient.     Sheral Flow, LPN  09/02/5407 Nurse Health Advisor      Subjective:   Lindsay Doyle is a 86 y.o. female who presents for Medicare Annual (Subsequent) preventive  examination.  Review of Systems:  Medicare Wellness Visit Cardiac Risk Factors include: advanced age (>49men, >17 women);dyslipidemia;hypertension  Sleep Patterns: No issues with falling sleep;feels rested on waking after sleeping 6-7 hours nightly. Home Safety/Smoke Alarms: Feels safe in home; Smoke alarms in place. Living environment: 1-story home; Lives with her husband. Seat Belt Safety/Bike Helmet: Wears seat belt.     Objective:     Vitals: BP 128/80 (BP Location: Left Arm, Patient Position: Sitting, Cuff Size: Small)   Pulse 84   Temp 98.2 F (36.8 C)   Resp 16   Ht 5\' 1"  (1.549 m)   Wt 114 lb 12.8 oz (52.1 kg)   SpO2 98%   BMI 21.69 kg/m   Body mass index is 21.69 kg/m.  Advanced Directives 07/11/2019 06/01/2019 05/31/2018 05/26/2017 05/20/2016 01/19/2016 01/19/2016  Does Patient Have a Medical Advance Directive? Yes No Yes Yes Yes No No  Type of [employer] of [place];Living will [place];Living will [place];Living will - -  Does patient want to make changes to medical advance directive? No - Patient declined - - - - - -  Copy of [place] in Chart? No - copy requested - No - copy requested No -  copy requested No - copy requested - -  Would patient like information on creating a medical advance directive? - No - Patient declined - - - No - patient declined information -    Tobacco Social History   Tobacco Use  Smoking Status Never Smoker  Smokeless Tobacco Never Used     Counseling given: No   Clinical Intake:  Pre-visit preparation completed: Yes  Pain : No/denies pain Pain Score: 0-No pain     Nutritional Status: BMI of 19-24  Normal Nutritional Risks: None Diabetes: No  How often do you need to have someone help you when you read instructions, pamphlets, or other written materials from your doctor or pharmacy?: 1 - Never What is the last  grade level you completed in school?: [employer]  Interpreter Needed?: No  Information entered by :: Ritchie Klee N. Lowell Guitar, LPN  Past Medical History:  Diagnosis Date  . ANEMIA-IRON DEFICIENCY    presumed GI bleed 08/2012- anticoag stopped  . Atrial fibrillation ([place])    a. Apixaban started 03/2012 - stopped after admx for GI bleed 6/14  . Blood transfusion    a. 05/2010: 3 transfusions for anemia/GIB.  [name] CAD (coronary artery disease)    a. LHC 1/14:  mLAD serial 30 and 60%, oRCA 30%, EF 60%, Afib,   . Cataract   . COLONIC POLYPS, HX OF 03/09/2007  . COPD 03/09/2007   ? pt is unsure  . GERD (gastroesophageal reflux disease)   . GI bleed    thought to be due to AVMs  . Hx of echocardiogram    a. echo 2/11:  EF 55-60%, mild AI, mild RAE, mild to mod TR;   b.  a. Echo 2/14:  Mild focal basal and mild LVH, EF 60-65%, mild AI, mild RAE, trivia effusion  . HYPERGLYCEMIA 07/02/2007  . Hyperlipidemia   . Hypertension 11/12/2006  . LEUKOPENIA, MILD 05/30/2008  . Mitral valve prolapse    Remotely - last echo 2011 did not show this  . NSTEMI (non-ST elevated myocardial infarction) ([place]) 03/2012   a. Type II in setting of AF with RVR 03/2012  . OSTEOARTHRITIS 02/19/2009   Spinal  . OSTEOPOROSIS 11/12/2006  . Positive H. pylori test 10/1996  . PSVT    a. Remotely, in setting of anemia.  [name] Spinal stenosis of lumbar region 07/16/2012  . Transfusion history    7/15    Past Surgical History:  Procedure Laterality Date  . ABDOMINAL HYSTERECTOMY    . CARDIAC CATHETERIZATION  1992   S/P in 1992 at [facility] in [place]. This was negative for any coronary artery disease  . carotid duplex  08/29/2003  . CATARACT EXTRACTION    . ceasarean    . ENTEROSCOPY N/A 10/10/2013   Procedure: ENTEROSCOPY;  Surgeon: Inda Castle, MD;  Location: [facility] ENDOSCOPY;  Service: Endoscopy;  Laterality: N/A;  . ENTEROSCOPY N/A 12/28/2014   Procedure: ENTEROSCOPY;  Surgeon: Inda Castle, MD;   Location: [facility] ENDOSCOPY;  Service: Endoscopy;  Laterality: N/A;  . ENTEROSCOPY N/A 01/20/2016   Procedure: ENTEROSCOPY;  Surgeon: Manus Gunning, MD;  [facility] ENDOSCOPY;  Service: Gastroenterology;  Laterality: N/A;  . HOT HEMOSTASIS N/A 12/28/2014   Procedure: HOT HEMOSTASIS (ARGON PLASMA COAGULATION/BICAP);  Surgeon: Inda Castle, MD;  Location: [facility];  Service: Endoscopy;  Laterality: N/A;  . LEFT HEART CATHETERIZATION WITH CORONARY ANGIOGRAM N/A 04/23/2012   Procedure: LEFT HEART CATHETERIZATION WITH CORONARY ANGIOGRAM;  Surgeon: Kirk Ruths  Claris Gladden, MD;  Location: [facility] CATH LAB;  Service: Cardiovascular;  Laterality: N/A;  . TONSILLECTOMY    . TUBAL LIGATION     Family History  Problem Relation Age of Onset  . Prostate cancer Brother   . Stomach cancer Mother   . Sarcoidosis Sister   . Colon cancer Brother   . Sarcoidosis Other   . CAD Neg Hx    Social History   Socioeconomic History  . Marital status: Married    Spouse name: Not on file  . Number of children: 1  . Years of education: Not on file  . Highest education level: Not on file  Occupational History  . Occupation: retired    Comment: retired  Tobacco Use  . Smoking status: Never Smoker  . Smokeless tobacco: Never Used  Substance and Sexual Activity  . Alcohol use: No  . Drug use: No  . Sexual activity: Never  Other Topics Concern  . Not on file  Social History Narrative  . Not on file   Social Determinants of Health   Financial Resource Strain:   . Difficulty of Paying Living Expenses:   Food Insecurity:   . Worried About Charity fundraiser in the Last Year:   . Arboriculturist in the Last Year:   Transportation Needs:   . Film/video editor (Medical):   [name] Lack of Transportation (Non-Medical):   Physical Activity:   . Days of Exercise per Week:   . Minutes of Exercise per Session:   Stress:   . Feeling of Stress :   Social Connections:   . Frequency of Communication with  Friends and Family:   . Frequency of Social Gatherings with Friends and Family:   . Attends Religious Services:   . Active Member of Clubs or Organizations:   . Attends Archivist Meetings:   [name] Marital Status:     Outpatient Encounter Medications as of 07/11/2019  Medication Sig  . acetaminophen (TYLENOL) 500 MG tablet Take 2 tablets (1,000 mg total) by mouth 2 (two) times daily.  [name] atorvastatin (LIPITOR) 20 MG tablet TAKE 1 TABLET BY MOUTH EVERY DAY  . ferrous sulfate 325 (65 FE) MG tablet TAKE 1 TABLET BY MOUTH 2 TIMES DAILY WITH A MEAL.  . metoprolol succinate (TOPROL-XL) 50 MG 24 hr tablet Take 1 tablet (50 mg total) by mouth daily. Take with or immediately following a meal.  . Multiple Vitamin (MULTIVITAMIN) tablet Take 1 tablet by mouth daily.  . nitroGLYCERIN (NITROSTAT) 0.4 MG SL tablet Place 1 tablet (0.4 mg total) under the tongue every 5 (five) minutes as needed for chest pain (maximum of 3 tablets).   No facility-administered encounter medications on file as of 07/11/2019.    Activities of Daily Living In your present state of health, do you have any difficulty performing the following activities: 07/11/2019  Hearing? N  Vision? N  Difficulty concentrating or making decisions? N  Walking or climbing stairs? N  Dressing or bathing? N  Doing errands, shopping? N  Preparing Food and eating ? N  Using the Toilet? N  In the past six months, have you accidently leaked urine? N  Do you have problems with loss of bowel control? N  Managing your Medications? N  Managing your Finances? N  Housekeeping or managing your Housekeeping? N  Some recent data might be hidden    Patient Care Team: Biagio Borg, MD as PCP - General (Internal Medicine) Sherren Mocha,  MD as PCP - Cardiology (Cardiology) Armbruster, Carlota Raspberry, MD as Consulting Physician (Gastroenterology)    Assessment:   This is a routine wellness examination for Lindsay Doyle.  Exercise Activities and Dietary  recommendations Current Exercise Habits: Home exercise routine, Type of exercise: Other - see comments(exercise on tv), Time (Minutes): 60, Frequency (Times/Week): 5, Weekly Exercise (Minutes/Week): 300, Intensity: Moderate, Exercise limited by: None identified  Goals    . Patient Stated     Maintain current healthy status, stay as active and as independent as possible    . Patient Stated     Maintain my weight by increasing my lunch. Continue to exercise.     . Patient Stated     To stay safe and survive this pandemic.    [name] wants to improve memory     Continue to work puzzles. I will try to do a puzzle per day.       Fall Risk Fall Risk  07/11/2019 09/16/2018 05/31/2018 03/12/2018 05/26/2017  Falls in the past year? 0 0 0 0 Yes  Number falls in past yr: 0 - - - 1  Injury with Fall? 0 - - - Yes  Comment - - - - -  Risk for fall due to : No Fall Risks - - - -  Follow up Falls evaluation completed;Education provided;Falls prevention discussed - - - -   Is the patient's home free of loose throw rugs in walkways, pet beds, electrical cords, etc?   yes      Grab bars in the bathroom? yes      Handrails on the stairs?   yes      Adequate lighting?   yes  Depression Screen PHQ 2/9 Scores 07/11/2019 09/16/2018 05/31/2018 03/12/2018  PHQ - 2 Score 0 1 0 1  PHQ- 9 Score - - - -     Cognitive Function MMSE - Mini Mental State Exam 05/31/2018 05/26/2017 05/20/2016  Orientation to time 5 5 5   Orientation to Place 5 5 5   Registration 3 3 3   Attention/ Calculation 5 5 5   Recall 3 1 1   Language- name 2 objects 2 2 2   Language- repeat 1 1 1   Language- follow 3 step command 3 3 3   Language- read & follow direction 1 1 1   Write a sentence 1 1 1   Copy design 1 1 1   Total score 30 28 28      6CIT Screen 07/11/2019  What Year? 0 points  What month? 0 points  What time? 0 points  Count back from 20 0 points  Months in reverse 0 points  Repeat phrase 2 points  Total Score 2    Immunization  History  Administered Date(s) Administered  . Fluad Quad(high Dose 65+) 12/23/2018  . Influenza Split 03/10/2011, 01/08/2012  . Influenza Whole 02/07/2008, 02/05/2009, 02/18/2010  . Influenza, High Dose Seasonal PF 01/25/2013, 03/06/2015, 01/16/2017, 01/08/2018  . Influenza,inj,Quad PF,6+ Mos 01/25/2014, 01/03/2016  . PFIZER SARS-COV-2 Vaccination 05/14/2019, 06/06/2019  . Pneumococcal Conjugate-13 02/08/2013  . Pneumococcal Polysaccharide-23 12/29/1997, 01/08/2012  . Td 07/29/1996, 07/17/2008    Qualifies for Shingles Vaccine? declined  Screening Tests Health Maintenance  Topic Date Due  . TETANUS/TDAP  07/18/2018  . INFLUENZA VACCINE  10/30/2019  . DEXA SCAN  Completed  . PNA vac Low Risk Adult  Completed    Cancer Screenings: Lung: Low Dose CT Chest recommended if Age 60-80 years, 30 pack-year currently smoking OR have quit w/in 15years. Patient does not qualify. Breast:  Up to date on Mammogram? Yes   Up to date of Bone Density/Dexa? Yes Colorectal: not recommended due to age     Plan:     Reviewed health maintenance screenings with patient today and relevant education, vaccines, and/or referrals were provided.    Continue doing brain stimulating activities (puzzles, reading, adult coloring books, staying active) to keep memory sharp.    Continue to eat heart healthy diet (full of fruits, vegetables, whole grains, lean protein, water--limit salt, fat, and sugar intake) and increase physical activity as tolerated.  I have personally reviewed and noted the following in the patient's chart:   . Medical and social history . Use of alcohol, tobacco or illicit drugs  . Current medications and supplements . Functional ability and status . Nutritional status . Physical activity . Advanced directives . List of other physicians . Hospitalizations, surgeries, and ER visits in previous 12 months . Vitals . Screenings to include cognitive, depression, and falls . Referrals  and appointments  In addition, I have reviewed and discussed with patient certain preventive protocols, quality metrics, and best practice recommendations. A written personalized care plan for preventive services as well as general preventive health recommendations were provided to patient.     Sheral Flow, LPN  [id number]/[id number]  Nurse [place] screening examination/treatment/procedure(s) were performed by non-physician practitioner and as supervising physician I was immediately available for consultation/collaboration. I agree with above. Scarlette Calico, MD

## 2019-07-19 ENCOUNTER — Other Ambulatory Visit: Payer: Self-pay | Admitting: Cardiovascular Disease

## 2019-09-16 ENCOUNTER — Other Ambulatory Visit: Payer: Self-pay

## 2019-09-16 ENCOUNTER — Ambulatory Visit (INDEPENDENT_AMBULATORY_CARE_PROVIDER_SITE_OTHER): Payer: Medicare Other | Admitting: Internal Medicine

## 2019-09-16 ENCOUNTER — Encounter: Payer: Self-pay | Admitting: Internal Medicine

## 2019-09-16 VITALS — BP 122/86 | HR 84 | Temp 97.8°F | Ht 61.0 in | Wt 113.0 lb

## 2019-09-16 DIAGNOSIS — N184 Chronic kidney disease, stage 4 (severe): Secondary | ICD-10-CM | POA: Diagnosis not present

## 2019-09-16 DIAGNOSIS — E538 Deficiency of other specified B group vitamins: Secondary | ICD-10-CM | POA: Diagnosis not present

## 2019-09-16 DIAGNOSIS — H918X2 Other specified hearing loss, left ear: Secondary | ICD-10-CM

## 2019-09-16 DIAGNOSIS — E785 Hyperlipidemia, unspecified: Secondary | ICD-10-CM | POA: Diagnosis not present

## 2019-09-16 DIAGNOSIS — E559 Vitamin D deficiency, unspecified: Secondary | ICD-10-CM

## 2019-09-16 DIAGNOSIS — R739 Hyperglycemia, unspecified: Secondary | ICD-10-CM

## 2019-09-16 DIAGNOSIS — R197 Diarrhea, unspecified: Secondary | ICD-10-CM | POA: Diagnosis not present

## 2019-09-16 LAB — CBC WITH DIFFERENTIAL/PLATELET
Basophils Absolute: 0 10*3/uL (ref 0.0–0.1)
Basophils Relative: 0.6 % (ref 0.0–3.0)
Eosinophils Absolute: 0.2 10*3/uL (ref 0.0–0.7)
Eosinophils Relative: 2.4 % (ref 0.0–5.0)
HCT: 37.2 % (ref 36.0–46.0)
Hemoglobin: 12.3 g/dL (ref 12.0–15.0)
Lymphocytes Relative: 44.1 % (ref 12.0–46.0)
Lymphs Abs: 2.8 10*3/uL (ref 0.7–4.0)
MCHC: 33.1 g/dL (ref 30.0–36.0)
MCV: 102.6 fl — ABNORMAL HIGH (ref 78.0–100.0)
Monocytes Absolute: 0.6 10*3/uL (ref 0.1–1.0)
Monocytes Relative: 10 % (ref 3.0–12.0)
Neutro Abs: 2.8 10*3/uL (ref 1.4–7.7)
Neutrophils Relative %: 42.9 % — ABNORMAL LOW (ref 43.0–77.0)
Platelets: 248 10*3/uL (ref 150.0–400.0)
RBC: 3.63 Mil/uL — ABNORMAL LOW (ref 3.87–5.11)
RDW: 12.9 % (ref 11.5–15.5)
WBC: 6.5 10*3/uL (ref 4.0–10.5)

## 2019-09-16 LAB — LIPID PANEL
Cholesterol: 170 mg/dL (ref 0–200)
HDL: 69.7 mg/dL (ref >39.00)
LDL Cholesterol: 75 mg/dL (ref 0–99)
NonHDL: 100.04
Total CHOL/HDL Ratio: 2
Triglycerides: 125 mg/dL (ref 0.0–149.0)
VLDL: 25 mg/dL (ref 0.0–40.0)

## 2019-09-16 LAB — URINALYSIS, ROUTINE W REFLEX MICROSCOPIC
Bilirubin Urine: NEGATIVE
Ketones, ur: NEGATIVE
Leukocytes,Ua: NEGATIVE
Nitrite: NEGATIVE
Specific Gravity, Urine: <=1.005 — AB (ref 1.000–1.030)
Total Protein, Urine: NEGATIVE
Urine Glucose: NEGATIVE
Urobilinogen, UA: 0.2 (ref 0.0–1.0)
pH: 6 (ref 5.0–8.0)

## 2019-09-16 LAB — BASIC METABOLIC PANEL
BUN: 16 mg/dL (ref 6–23)
CO2: 28 mEq/L (ref 19–32)
Calcium: 10.2 mg/dL (ref 8.4–10.5)
Chloride: 99 mEq/L (ref 96–112)
Creatinine, Ser: 1.07 mg/dL (ref 0.40–1.20)
GFR: 59.09 mL/min — ABNORMAL LOW (ref >60.00)
Glucose, Bld: 92 mg/dL (ref 70–99)
Potassium: 4.4 mEq/L (ref 3.5–5.1)
Sodium: 134 mEq/L — ABNORMAL LOW (ref 135–145)

## 2019-09-16 LAB — HEPATIC FUNCTION PANEL
ALT: 14 U/L (ref 0–35)
AST: 19 U/L (ref 0–37)
Albumin: 4.8 g/dL (ref 3.5–5.2)
Alkaline Phosphatase: 51 U/L (ref 39–117)
Bilirubin, Direct: 0.1 mg/dL (ref 0.0–0.3)
Total Bilirubin: 0.4 mg/dL (ref 0.2–1.2)
Total Protein: 7.9 g/dL (ref 6.0–8.3)

## 2019-09-16 LAB — HEMOGLOBIN A1C: Hgb A1c MFr Bld: 5.9 % (ref 4.6–6.5)

## 2019-09-16 LAB — VITAMIN B12: Vitamin B-12: 1434 pg/mL — ABNORMAL HIGH (ref 211–911)

## 2019-09-16 LAB — VITAMIN D 25 HYDROXY (VIT D DEFICIENCY, FRACTURES): VITD: 44.77 ng/mL (ref 30.00–100.00)

## 2019-09-16 LAB — TSH: TSH: 1.22 u[IU]/mL (ref 0.35–4.50)

## 2019-09-16 NOTE — Progress Notes (Signed)
Subjective:    Patient ID: Lindsay Doyle, female    DOB: 03-29-1934, 86 y.o.   MRN: [id number]  HPI  Here to f/u; overall doing ok,  Pt denies chest pain, increasing sob or doe, wheezing, orthopnea, PND, increased LE swelling, palpitations, dizziness or syncope.  Pt denies new neurological symptoms such as new headache, or facial or extremity weakness or numbness.  Pt denies polydipsia, polyuria, or low sugar episode.  Pt states overall good compliance with meds, mostly trying to follow appropriate diet, with wt overall stable,  but little exercise however.  Also,  C/o 1 days diarrhea watery starting 3 days ago, last episode 2 days ago, some better with brat diet, no n/v or pain, no fever, chills, or blood.  Stopped the iron  X 3 days due to dark stool, but no stools today.   Wt Readings from Last 3 Encounters:  09/16/19 113 lb (51.3 kg)  07/11/19 114 lb 12.8 oz (52.1 kg)  06/08/19 112 lb 1.9 oz (50.9 kg)  Still excercises 30 min every day with senior show at 130 every weekday.   Also has left ear hearing loss acute x 1 wk without pain, ha, or fever, Past Medical History:  Diagnosis Date  . ANEMIA-IRON DEFICIENCY    presumed GI bleed 08/2012- anticoag stopped  . Atrial fibrillation ([place])    a. Apixaban started 03/2012 - stopped after admx for GI bleed 6/14  . Blood transfusion    a. 05/2010: 3 transfusions for anemia/GIB.  [name] CAD (coronary artery disease)    a. LHC 1/14:  mLAD serial 30 and 60%, oRCA 30%, EF 60%, Afib,   . Cataract   . COLONIC POLYPS, HX OF 03/09/2007  . COPD 03/09/2007   ? pt is unsure  . GERD (gastroesophageal reflux disease)   . GI bleed    thought to be due to AVMs  . Hx of echocardiogram    a. echo 2/11:  EF 55-60%, mild AI, mild RAE, mild to mod TR;   b.  a. Echo 2/14:  Mild focal basal and mild LVH, EF 60-65%, mild AI, mild RAE, trivia effusion  . HYPERGLYCEMIA 07/02/2007  . Hyperlipidemia   . Hypertension 11/12/2006  . LEUKOPENIA, MILD 05/30/2008  . Mitral valve  prolapse    Remotely - last echo 2011 did not show this  . NSTEMI (non-ST elevated myocardial infarction) ([place]) 03/2012   a. Type II in setting of AF with RVR 03/2012  . OSTEOARTHRITIS 02/19/2009   Spinal  . OSTEOPOROSIS 11/12/2006  . Positive H. pylori test 10/1996  . PSVT    a. Remotely, in setting of anemia.  [name] Spinal stenosis of lumbar region 07/16/2012  . Transfusion history    7/15    Past Surgical History:  Procedure Laterality Date  . ABDOMINAL HYSTERECTOMY    . CARDIAC CATHETERIZATION  1992   S/P in 1992 at [facility] in [place]. This was negative for any coronary artery disease  . carotid duplex  08/29/2003  . CATARACT EXTRACTION    . ceasarean    . ENTEROSCOPY N/A 10/10/2013   Procedure: ENTEROSCOPY;  Surgeon: Inda Castle, MD;  Location: [facility] ENDOSCOPY;  Service: Endoscopy;  Laterality: N/A;  . ENTEROSCOPY N/A 12/28/2014   Procedure: ENTEROSCOPY;  Surgeon: Inda Castle, MD;  Location: [facility] ENDOSCOPY;  Service: Endoscopy;  Laterality: N/A;  . ENTEROSCOPY N/A 01/20/2016   Procedure: ENTEROSCOPY;  Surgeon: Manus Gunning, MD;  Location: [facility] ENDOSCOPY;  Service: Gastroenterology;  Laterality: N/A;  . HOT HEMOSTASIS N/A 12/28/2014   Procedure: HOT HEMOSTASIS (ARGON PLASMA COAGULATION/BICAP);  Surgeon: Inda Castle, MD;  Location: [facility];  Service: Endoscopy;  Laterality: N/A;  . LEFT HEART CATHETERIZATION WITH CORONARY ANGIOGRAM N/A 04/23/2012   Procedure: LEFT HEART CATHETERIZATION WITH CORONARY ANGIOGRAM;  Surgeon: Larey Dresser, MD;  Location: [facility] CATH LAB;  Service: Cardiovascular;  Laterality: N/A;  . TONSILLECTOMY    . TUBAL LIGATION      reports that she has never smoked. She has never used smokeless tobacco. She reports that she does not drink alcohol and does not use drugs. family history includes Colon cancer in her brother; Prostate cancer in her brother; Sarcoidosis in her sister and another family member; Stomach cancer in her  mother. Allergies  Allergen Reactions  . Doxycycline Diarrhea    Possible diarrhea  . Biaxin [Clarithromycin] Other (See Comments)    diarrhea  . Metronidazole Other (See Comments)    Pt had difficulty swallowing this and says it was "horrible" to take. No allergic reaction.   Current Outpatient Medications on File Prior to Visit  Medication Sig Dispense Refill  . acetaminophen (TYLENOL) 500 MG tablet Take 2 tablets (1,000 mg total) by mouth 2 (two) times daily. 60 tablet 11  . atorvastatin (LIPITOR) 20 MG tablet TAKE 1 TABLET BY MOUTH EVERY DAY 90 tablet 1  . ferrous sulfate 325 (65 FE) MG tablet TAKE 1 TABLET BY MOUTH 2 TIMES DAILY WITH A MEAL. 180 tablet 0  . Multiple Vitamin (MULTIVITAMIN) tablet Take 1 tablet by mouth daily.    . nitroGLYCERIN (NITROSTAT) 0.4 MG SL tablet Place 1 tablet (0.4 mg total) under the tongue every 5 (five) minutes as needed for chest pain (maximum of 3 tablets). 25 tablet 1  . metoprolol succinate (TOPROL-XL) 50 MG 24 hr tablet Take 1 tablet (50 mg total) by mouth daily. Take with or immediately following a meal. 90 tablet 3   No current facility-administered medications on file prior to visit.   Review of Systems All otherwise neg per pt    Objective:   Physical Exam BP 122/86 (BP Location: Left Arm, Patient Position: Sitting, Cuff Size: Large)   Pulse 84   Temp 97.8 F (36.6 C) (Oral)   Ht 5\' 1"  (1.549 m)   Wt 113 lb (51.3 kg)   SpO2 99%   BMI 21.35 kg/m  VS noted,  Constitutional: Pt appears in NAD HENT: Head: NCAT.  Right Ear: External ear normal.  Left Ear: External ear normal. , left canal wax impaction resolved with irrigation and hearing improved Eyes: . Pupils are equal, round, and reactive to light. Conjunctivae and EOM are normal Nose: without d/c or deformity Neck: Neck supple. Gross normal ROM Cardiovascular: Normal rate and regular rhythm.   Pulmonary/Chest: Effort normal and breath sounds without rales or wheezing.  Abd:   Soft, NT, ND, + BS, no organomegaly Neurological: Pt is alert. At baseline orientation, motor grossly intact Skin: Skin is warm. No rashes, other new lesions, no LE edema Psychiatric: Pt behavior is normal without agitation  All otherwise neg per pt  Lab Results  Component Value Date   WBC 6.5 09/16/2019   HGB 12.3 09/16/2019   HCT 37.2 09/16/2019   PLT 248.0 09/16/2019   GLUCOSE 92 09/16/2019   CHOL 170 09/16/2019   TRIG 125.0 09/16/2019   HDL 69.70 09/16/2019   LDLDIRECT 106.0 03/18/2019   LDLCALC 75 09/16/2019   ALT 14  09/16/2019   AST 19 09/16/2019   NA 134 (L) 09/16/2019   K 4.4 09/16/2019   CL 99 09/16/2019   CREATININE 1.07 09/16/2019   BUN 16 09/16/2019   CO2 28 09/16/2019   TSH 1.22 09/16/2019   INR 1.06 01/20/2016   HGBA1C 5.9 09/16/2019      Assessment & Plan:

## 2019-09-16 NOTE — Patient Instructions (Signed)
Your left ear was irrigated of wax today  Please continue all other medications as before, and refills have been done if requested.  Please have the pharmacy call with any other refills you may need.  Please continue your efforts at being more active, low cholesterol diet, and weight control.  You are otherwise up to date with prevention measures today.  Please keep your appointments with your specialists as you may have planned  Please go to the LAB at the blood drawing area for the tests to be done  You will be contacted by phone if any changes need to be made immediately.  Otherwise, you will receive a letter about your results with an explanation, but please check with MyChart first.  Please remember to sign up for MyChart if you have not done so, as this will be important to you in the future with finding out test results, communicating by private email, and scheduling acute appointments online when needed.  Please make an Appointment to return in 6 months, or sooner if needed

## 2019-09-17 ENCOUNTER — Encounter: Payer: Self-pay | Admitting: Internal Medicine

## 2019-09-17 DIAGNOSIS — H9192 Unspecified hearing loss, left ear: Secondary | ICD-10-CM | POA: Insufficient documentation

## 2019-09-17 NOTE — Assessment & Plan Note (Signed)
stable overall by history and exam, recent data reviewed with pt, and pt to continue medical treatment as before,  to f/u any worsening symptoms or concerns  

## 2019-09-17 NOTE — Assessment & Plan Note (Addendum)
Exam benign, continu to monitor  I spent 31 minutes in preparing to see the patient by review of recent labs, imaging and procedures, obtaining and reviewing separately obtained history, communicating with the patient and family or caregiver, ordering medications, tests or procedures, and documenting clinical information in the EHR including the differential Dx, treatment, and any further evaluation and other management of diarrhea, ckd, hyperglycemia, left hearing loss, hld

## 2019-09-17 NOTE — Assessment & Plan Note (Signed)
Due to wax, resolved/improved with irrigation,  to f/u any worsening symptoms or concerns

## 2019-10-11 ENCOUNTER — Other Ambulatory Visit: Payer: Self-pay | Admitting: Gastroenterology

## 2019-11-09 ENCOUNTER — Ambulatory Visit (INDEPENDENT_AMBULATORY_CARE_PROVIDER_SITE_OTHER): Payer: Medicare Other | Admitting: Internal Medicine

## 2019-11-09 ENCOUNTER — Encounter: Payer: Self-pay | Admitting: Internal Medicine

## 2019-11-09 ENCOUNTER — Other Ambulatory Visit: Payer: Self-pay

## 2019-11-09 DIAGNOSIS — R739 Hyperglycemia, unspecified: Secondary | ICD-10-CM | POA: Diagnosis not present

## 2019-11-09 DIAGNOSIS — I10 Essential (primary) hypertension: Secondary | ICD-10-CM | POA: Diagnosis not present

## 2019-11-09 DIAGNOSIS — M797 Fibromyalgia: Secondary | ICD-10-CM

## 2019-11-09 DIAGNOSIS — M549 Dorsalgia, unspecified: Secondary | ICD-10-CM | POA: Insufficient documentation

## 2019-11-09 MED ORDER — TRAMADOL HCL 50 MG PO TABS: 50.0000 mg | ORAL_TABLET | Freq: Four times a day (QID) | ORAL | 0 refills | Status: DC | PRN

## 2019-11-09 NOTE — Assessment & Plan Note (Signed)
stable overall by history and exam, recent data reviewed with pt, and pt to continue medical treatment as before,  to f/u any worsening symptoms or concerns  

## 2019-11-09 NOTE — Patient Instructions (Signed)
Please take all new medication as prescribed - the pain medication as needed  Please continue all other medications as before, and refills have been done if requested.  Please have the pharmacy call with any other refills you may need.  Please continue your efforts at being more active, low cholesterol diet, and weight control.  Please keep your appointments with your specialists as you may have planned

## 2019-11-09 NOTE — Assessment & Plan Note (Addendum)
Exam most c/w myofascial pain, for pain control,  to f/u any worsening symptoms or concerns  I spent 31 minutes in preparing to see the patient by review of recent labs, imaging and procedures, obtaining and reviewing separately obtained history, communicating with the patient and family or caregiver, ordering medications, tests or procedures, and documenting clinical information in the EHR including the differential Dx, treatment, and any further evaluation and other management of left upper back pain, htn, hyperglycemia, fms

## 2019-11-09 NOTE — Progress Notes (Addendum)
Subjective:    Patient ID: Lindsay Doyle, female    DOB: [birth year], 86 y.o.   MRN: [id number]  HPI  Here to f/u; overall doing ok,  Pt denies chest pain, increasing sob or doe, wheezing, orthopnea, PND, increased LE swelling, palpitations, dizziness or syncope.  Pt denies new neurological symptoms such as new headache, or facial or extremity weakness or numbness.  Pt denies polydipsia, polyuria, also c/o left upper back pain sharp with radiation to the left neck and occiput area, mild intermittent for 1 yr, worse sometimes to move the head and shoulder, nonpleuritic, nonpositional, non exertional, nothing seems to make better or worse Past Medical History:  Diagnosis Date  . ANEMIA-IRON DEFICIENCY    presumed GI bleed 08/2012- anticoag stopped  . Atrial fibrillation ([place])    a. Apixaban started 03/2012 - stopped after admx for GI bleed 6/14  . Blood transfusion    a. 05/2010: 3 transfusions for anemia/GIB.  [name] CAD (coronary artery disease)    a. LHC 1/14:  mLAD serial 30 and 60%, oRCA 30%, EF 60%, Afib,   . Cataract   . COLONIC POLYPS, HX OF 03/09/2007  . COPD 03/09/2007   ? pt is unsure  . GERD (gastroesophageal reflux disease)   . GI bleed    thought to be due to AVMs  . Hx of echocardiogram    a. echo 2/11:  EF 55-60%, mild AI, mild RAE, mild to mod TR;   b.  a. Echo 2/14:  Mild focal basal and mild LVH, EF 60-65%, mild AI, mild RAE, trivia effusion  . HYPERGLYCEMIA 07/02/2007  . Hyperlipidemia   . Hypertension 11/12/2006  . LEUKOPENIA, MILD 05/30/2008  . Mitral valve prolapse    Remotely - last echo 2011 did not show this  . NSTEMI (non-ST elevated myocardial infarction) ([place]) 03/2012   a. Type II in setting of AF with RVR 03/2012  . OSTEOARTHRITIS 02/19/2009   Spinal  . OSTEOPOROSIS 11/12/2006  . Positive H. pylori test 10/1996  . PSVT    a. Remotely, in setting of anemia.  [name] Spinal stenosis of lumbar region 07/16/2012  . Transfusion history    7/15    Past Surgical History:    Procedure Laterality Date  . ABDOMINAL HYSTERECTOMY    . CARDIAC CATHETERIZATION  1992   S/P in 1992 at [facility] in [place]. This was negative for any coronary artery disease  . carotid duplex  08/29/2003  . CATARACT EXTRACTION    . ceasarean    . ENTEROSCOPY N/A 10/10/2013   Procedure: ENTEROSCOPY;  Surgeon: Inda Castle, MD;  Location: [facility] ENDOSCOPY;  Service: Endoscopy;  Laterality: N/A;  . ENTEROSCOPY N/A 12/28/2014   Procedure: ENTEROSCOPY;  Surgeon: Inda Castle, MD;  Location: [facility] ENDOSCOPY;  Service: Endoscopy;  Laterality: N/A;  . ENTEROSCOPY N/A 01/20/2016   Procedure: ENTEROSCOPY;  Surgeon: Manus Gunning, MD;  Location: [facility] ENDOSCOPY;  Service: Gastroenterology;  Laterality: N/A;  . HOT HEMOSTASIS N/A 12/28/2014   Procedure: HOT HEMOSTASIS (ARGON PLASMA COAGULATION/BICAP);  Surgeon: Inda Castle, MD;  Location: [facility];  Service: Endoscopy;  Laterality: N/A;  . LEFT HEART CATHETERIZATION WITH CORONARY ANGIOGRAM N/A 04/23/2012   Procedure: LEFT HEART CATHETERIZATION WITH CORONARY ANGIOGRAM;  Surgeon: Larey Dresser, MD;  Location: [facility] CATH LAB;  Service: Cardiovascular;  Laterality: N/A;  . TONSILLECTOMY    . TUBAL LIGATION      reports that she has never smoked. She has  never used smokeless tobacco. She reports that she does not drink alcohol and does not use drugs. family history includes Colon cancer in her brother; Prostate cancer in her brother; Sarcoidosis in her sister and another family member; Stomach cancer in her mother. Allergies  Allergen Reactions  . Doxycycline Diarrhea    Possible diarrhea  . Biaxin [Clarithromycin] Other (See Comments)    diarrhea  . Metronidazole Other (See Comments)    Pt had difficulty swallowing this and says it was "horrible" to take. No allergic reaction.   Current Outpatient Medications on File Prior to Visit  Medication Sig Dispense Refill  . acetaminophen (TYLENOL) 500 MG tablet Take 2  tablets (1,000 mg total) by mouth 2 (two) times daily. 60 tablet 11  . atorvastatin (LIPITOR) 20 MG tablet TAKE 1 TABLET BY MOUTH EVERY DAY 90 tablet 1  . ferrous sulfate 325 (65 FE) MG tablet TAKE 1 TABLET BY MOUTH 2 TIMES DAILY WITH A MEAL. 180 tablet 0  . Multiple Vitamin (MULTIVITAMIN) tablet Take 1 tablet by mouth daily.    . nitroGLYCERIN (NITROSTAT) 0.4 MG SL tablet Place 1 tablet (0.4 mg total) under the tongue every 5 (five) minutes as needed for chest pain (maximum of 3 tablets). 25 tablet 1  . metoprolol succinate (TOPROL-XL) 50 MG 24 hr tablet Take 1 tablet (50 mg total) by mouth daily. Take with or immediately following a meal. 90 tablet 3   No current facility-administered medications on file prior to visit.   Review of Systems All otherwise neg per pt    Objective:   Physical Exam BP 110/80 (BP Location: Left Arm, Patient Position: Sitting, Cuff Size: Large)   Pulse (!) 106   Temp 98.7 F (37.1 C) (Oral)   Ht 5\' 1"  (1.549 m)   Wt 112 lb (50.8 kg)   SpO2 98%   BMI 21.16 kg/m  VS noted,  Constitutional: Pt appears in NAD HENT: Head: NCAT.  Right Ear: External ear normal.  Left Ear: External ear normal.  Eyes: . Pupils are equal, round, and reactive to light. Conjunctivae and EOM are normal Nose: without d/c or deformity Neck: Neck supple. Gross normal ROM, mild tender left trapezoid Cardiovascular: Normal rate and regular rhythm.   Pulmonary/Chest: Effort normal and breath sounds without rales or wheezing.  Neurological: Pt is alert. At baseline orientation, motor grossly intact Skin: Skin is warm. No rashes, other new lesions, no LE edema Psychiatric: Pt behavior is normal without agitation  All otherwise neg per pt Lab Results  Component Value Date   WBC 6.5 09/16/2019   HGB 12.3 09/16/2019   HCT 37.2 09/16/2019   PLT 248.0 09/16/2019   GLUCOSE 92 09/16/2019   CHOL 170 09/16/2019   TRIG 125.0 09/16/2019   HDL 69.70 09/16/2019   LDLDIRECT 106.0 03/18/2019     LDLCALC 75 09/16/2019   ALT 14 09/16/2019   AST 19 09/16/2019   NA 134 (L) 09/16/2019   K 4.4 09/16/2019   CL 99 09/16/2019   CREATININE 1.07 09/16/2019   BUN 16 09/16/2019   CO2 28 09/16/2019   TSH 1.22 09/16/2019   INR 1.06 01/20/2016   HGBA1C 5.9 09/16/2019      Assessment & Plan:

## 2019-11-11 ENCOUNTER — Telehealth: Payer: Self-pay | Admitting: Internal Medicine

## 2019-11-11 NOTE — Telephone Encounter (Signed)
Updated med list../lmb

## 2019-11-11 NOTE — Telephone Encounter (Signed)
New message:   Pt is calling and states she is no longer taking Tramadol and states she would like it removed from her chart. Pt states she has destroyed the pills already. Please advise.

## 2019-11-16 ENCOUNTER — Ambulatory Visit (INDEPENDENT_AMBULATORY_CARE_PROVIDER_SITE_OTHER): Payer: Medicare Other | Admitting: Gastroenterology

## 2019-11-16 ENCOUNTER — Encounter: Payer: Self-pay | Admitting: Gastroenterology

## 2019-11-16 VITALS — BP 100/60 | HR 96 | Ht 61.0 in | Wt 113.0 lb

## 2019-11-16 DIAGNOSIS — K5521 Angiodysplasia of colon with hemorrhage: Secondary | ICD-10-CM | POA: Diagnosis not present

## 2019-11-16 DIAGNOSIS — D649 Anemia, unspecified: Secondary | ICD-10-CM

## 2019-11-16 NOTE — Progress Notes (Signed)
HPI :  86 year old female here for a follow-up visit for anemia and history of GI bleeding. See prior notes for details of her case. She has a history of GI bleeding due to small bowel AVMs.  I performed a push enteroscopy for her in October 2017 when she was admitted for GI bleed.  At that time she had an actively bleeding small bowel AVM that was treated endoscopically.  She has had no recurrence of bleeding since that time.  Prior to her procedure in 2017 she had a small bowel enteroscopy in 2016 with Dr. Deatra Ina, she had a gastric AVM ablated with APC on that procedure.  She has had iron deficiency dating back to 2012 at which time she had a normal colonoscopy, EGD showing gastritis, and capsule endoscopy without any significant findings.  She has been maintained on oral iron and has had stabilization of her hemoglobin.  Her last hemoglobin checked in June was normal.  She has chronically dark stools on iron but no blood that she can notice.  She denies NSAID use.  No abdominal pains.  She is eating well.  She has questions about diet and in regards to things that she can eat that would precipitate bleeding.  That is her main concern today.  She wishes to resume eating a variety of nuts that she likes to eat.     Past Medical History:  Diagnosis Date  . ANEMIA-IRON DEFICIENCY    presumed GI bleed 08/2012- anticoag stopped  . Atrial fibrillation ([place])    a. Apixaban started 03/2012 - stopped after admx for GI bleed 6/14  . Blood transfusion    a. 05/2010: 3 transfusions for anemia/GIB.  [name] CAD (coronary artery disease)    a. LHC 1/14:  mLAD serial 30 and 60%, oRCA 30%, EF 60%, Afib,   . Cataract   . COLONIC POLYPS, HX OF 03/09/2007  . COPD 03/09/2007   ? pt is unsure  . GERD (gastroesophageal reflux disease)   . GI bleed    thought to be due to AVMs  . Hx of echocardiogram    a. echo 2/11:  EF 55-60%, mild AI, mild RAE, mild to mod TR;   b.  a. Echo 2/14:  Mild focal basal and mild LVH, EF  60-65%, mild AI, mild RAE, trivia effusion  . HYPERGLYCEMIA 07/02/2007  . Hyperlipidemia   . Hypertension 11/12/2006  . LEUKOPENIA, MILD 05/30/2008  . Mitral valve prolapse    Remotely - last echo 2011 did not show this  . NSTEMI (non-ST elevated myocardial infarction) ([place]) 03/2012   a. Type II in setting of AF with RVR 03/2012  . OSTEOARTHRITIS 02/19/2009   Spinal  . OSTEOPOROSIS 11/12/2006  . Positive H. pylori test 10/1996  . PSVT    a. Remotely, in setting of anemia.  [name] Spinal stenosis of lumbar region 07/16/2012  . Transfusion history    7/15      Past Surgical History:  Procedure Laterality Date  . ABDOMINAL HYSTERECTOMY    . CARDIAC CATHETERIZATION  1992   S/P in 1992 at [facility] in [place]. This was negative for any coronary artery disease  . carotid duplex  08/29/2003  . CATARACT EXTRACTION    . ceasarean    . ENTEROSCOPY N/A 10/10/2013   Procedure: ENTEROSCOPY;  Surgeon: Inda Castle, MD;  Location: [facility] ENDOSCOPY;  Service: Endoscopy;  Laterality: N/A;  . ENTEROSCOPY N/A 12/28/2014   Procedure: ENTEROSCOPY;  Surgeon:  Inda Castle, MD;  Location: [facility] ENDOSCOPY;  Service: Endoscopy;  Laterality: N/A;  . ENTEROSCOPY N/A 01/20/2016   Procedure: ENTEROSCOPY;  Surgeon: Manus Gunning, MD;  Location: [facility] ENDOSCOPY;  Service: Gastroenterology;  Laterality: N/A;  . HOT HEMOSTASIS N/A 12/28/2014   Procedure: HOT HEMOSTASIS (ARGON PLASMA COAGULATION/BICAP);  Surgeon: Inda Castle, MD;  Location: [facility] ENDOSCOPY;  Service: Endoscopy;  Laterality: N/A;  . LEFT HEART CATHETERIZATION WITH CORONARY ANGIOGRAM N/A 04/23/2012   Procedure: LEFT HEART CATHETERIZATION WITH CORONARY ANGIOGRAM;  Surgeon: Larey Dresser, MD;  Location: [facility] CATH LAB;  Service: Cardiovascular;  Laterality: N/A;  . TONSILLECTOMY    . TUBAL LIGATION     Family History  Problem Relation Age of Onset  . Prostate cancer Brother   . Stomach cancer Mother   . Sarcoidosis Sister   .  Colon cancer Brother   . Sarcoidosis Other   . CAD Neg Hx    Social History   Tobacco Use  . Smoking status: Never Smoker  . Smokeless tobacco: Never Used  Vaping Use  . Vaping Use: Never used  Substance Use Topics  . Alcohol use: No  . Drug use: No   Current Outpatient Medications  Medication Sig Dispense Refill  . acetaminophen (TYLENOL) 500 MG tablet Take 2 tablets (1,000 mg total) by mouth 2 (two) times daily. 60 tablet 11  . atorvastatin (LIPITOR) 20 MG tablet TAKE 1 TABLET BY MOUTH EVERY DAY 90 tablet 1  . ferrous sulfate 325 (65 FE) MG tablet TAKE 1 TABLET BY MOUTH 2 TIMES DAILY WITH A MEAL. 180 tablet 0  . metoprolol succinate (TOPROL-XL) 50 MG 24 hr tablet Take 1 tablet (50 mg total) by mouth daily. Take with or immediately following a meal. 90 tablet 3  . Multiple Vitamin (MULTIVITAMIN) tablet Take 1 tablet by mouth daily.    . nitroGLYCERIN (NITROSTAT) 0.4 MG SL tablet Place 1 tablet (0.4 mg total) under the tongue every 5 (five) minutes as needed for chest pain (maximum of 3 tablets). 25 tablet 1   No current facility-administered medications for this visit.   Allergies  Allergen Reactions  . Doxycycline Diarrhea    Possible diarrhea  . Biaxin [Clarithromycin] Other (See Comments)    diarrhea  . Metronidazole Other (See Comments)    Pt had difficulty swallowing this and says it was "horrible" to take. No allergic reaction.     Review of Systems: All systems reviewed and negative except where noted in HPI.    CBC Latest Ref Rng & Units 09/16/2019 06/01/2019 03/18/2019  WBC 4.0 - 10.5 K/uL 6.5 6.9 6.5  Hemoglobin 12.0 - 15.0 g/dL 12.3 13.3 12.3  Hematocrit 36 - 46 % 37.2 43.9 36.6  Platelets 150 - 400 K/uL 248.0 256 251.0    Physical Exam: BP 100/60   Pulse 96   Ht 5\' 1"  (1.549 m)   Wt 113 lb (51.3 kg)   BMI 21.35 kg/m  Constitutional: Pleasant,well-developed, female in no acute distress. Neurological: Alert and oriented to person place and  time. Psychiatric: Normal mood and affect. Behavior is normal.   ASSESSMENT AND PLAN: 86 y/o female here for reassessment of the following issues:  Anemia / history of GI bleeding- history of upper GI bleeding and iron deficiency anemia in the past due to AVMs, most recently noted in the small bowel in 2017.  No recurrence of bleeding since that last exam and hemoglobin is stable on oral iron.  No plans for further endoscopic  evaluation at this time.  Due to her age we will not plan on any further screening colonoscopy.  I answered her questions about diet, she should not be restricted in her diet in any way, I reassured her this will not precipitate GI bleeding.  Her recent hemoglobin is normal, I do not think we need to check this as frequently anymore, would recommend hemoglobin once yearly with her primary care.  She can follow-up with me as needed needed for any recurrence of symptoms of bleeding.  She agreed  [place] Cellar, MD [facility] Gastroenterology

## 2019-11-16 NOTE — Patient Instructions (Signed)
If you are age 84 or older, your body mass index should be between 23-30. Your Body mass index is 21.35 kg/m. If this is out of the aforementioned range listed, please consider follow up with your Primary Care Provider.  If you are age 56 or younger, your body mass index should be between 19-25. Your Body mass index is 21.35 kg/m. If this is out of the aformentioned range listed, please consider follow up with your Primary Care Provider.   Avoid NSAIDs.  There are no dietary restrictions.  Thank you for entrusting me with your care and for choosing [facility], Dr. Laughlin Cellar

## 2019-11-22 ENCOUNTER — Telehealth: Payer: Self-pay | Admitting: Cardiovascular Disease

## 2019-11-22 ENCOUNTER — Telehealth: Payer: Self-pay

## 2019-11-22 NOTE — Telephone Encounter (Signed)
Looks like [facility], PAC changed dosage of Metoprolol.

## 2019-11-22 NOTE — Telephone Encounter (Signed)
New message   Seen on  8.18.2021 Dr. Havery Moros   Pt c/o medication issue:  1. Name of Medication: ferrous sulfate 325 (65 FE) MG tablet  2. How are you currently taking this medication (dosage and times per day)? One tablet two time a day with meal    3. Are you having a reaction (difficulty breathing--STAT)? No   4. What is your medication issue? Please advise on continue / discontinue taken medication

## 2019-11-22 NOTE — Telephone Encounter (Signed)
    Pt c/o medication issue:  1. Name of Medication:   metoprolol succinate (TOPROL-XL) 50 MG 24 hr tablet    2. How are you currently taking this medication (dosage and times per day)? Take 1 tablet (50 mg total) by mouth daily. Take with or immediately following a meal.  3. Are you having a reaction (difficulty breathing--STAT)?   4. What is your medication issue? Pt said she would like to go back to her old dosage, she was taking 37.5 mg taking 1 and a half tablet daily.

## 2019-11-23 NOTE — Telephone Encounter (Signed)
LDVM for pt informing her that she is to continue to take medication.

## 2019-11-23 NOTE — Telephone Encounter (Signed)
Called pt back regarding phone message received below.  Pt states that she wasn't asking to go back to the previous dose of 37.5 mg, she was just making sure it was supposed to stay at 50 mg.  Pt advised that she isn't having any problems on the 50 mg she was just making sure.  Pt was advised to continue on the 50 mg daily of Metoprolol and she verbalized understanding.

## 2019-11-26 ENCOUNTER — Other Ambulatory Visit: Payer: Self-pay

## 2019-11-26 ENCOUNTER — Emergency Department (HOSPITAL_COMMUNITY): Payer: Medicare Other

## 2019-11-26 ENCOUNTER — Emergency Department (HOSPITAL_COMMUNITY)
Admission: EM | Admit: 2019-11-26 | Discharge: 2019-11-26 | Disposition: A | Discharge status: Home / Self Care | Payer: Medicare Other | Attending: Emergency Medicine | Admitting: Emergency Medicine

## 2019-11-26 DIAGNOSIS — I471 Supraventricular tachycardia: Secondary | ICD-10-CM | POA: Diagnosis not present

## 2019-11-26 DIAGNOSIS — J449 Chronic obstructive pulmonary disease, unspecified: Secondary | ICD-10-CM | POA: Insufficient documentation

## 2019-11-26 DIAGNOSIS — R200 Anesthesia of skin: Secondary | ICD-10-CM | POA: Insufficient documentation

## 2019-11-26 DIAGNOSIS — I5032 Chronic diastolic (congestive) heart failure: Secondary | ICD-10-CM | POA: Insufficient documentation

## 2019-11-26 DIAGNOSIS — J45909 Unspecified asthma, uncomplicated: Secondary | ICD-10-CM | POA: Diagnosis not present

## 2019-11-26 DIAGNOSIS — N184 Chronic kidney disease, stage 4 (severe): Secondary | ICD-10-CM | POA: Insufficient documentation

## 2019-11-26 DIAGNOSIS — I130 Hypertensive heart and chronic kidney disease with heart failure and stage 1 through stage 4 chronic kidney disease, or unspecified chronic kidney disease: Secondary | ICD-10-CM | POA: Diagnosis not present

## 2019-11-26 DIAGNOSIS — R42 Dizziness and giddiness: Secondary | ICD-10-CM | POA: Diagnosis not present

## 2019-11-26 DIAGNOSIS — R079 Chest pain, unspecified: Secondary | ICD-10-CM | POA: Diagnosis not present

## 2019-11-26 DIAGNOSIS — I2510 Atherosclerotic heart disease of native coronary artery without angina pectoris: Secondary | ICD-10-CM | POA: Diagnosis not present

## 2019-11-26 DIAGNOSIS — Z79899 Other long term (current) drug therapy: Secondary | ICD-10-CM | POA: Insufficient documentation

## 2019-11-26 DIAGNOSIS — R002 Palpitations: Secondary | ICD-10-CM | POA: Diagnosis not present

## 2019-11-26 DIAGNOSIS — R000 Tachycardia, unspecified: Secondary | ICD-10-CM | POA: Diagnosis not present

## 2019-11-26 DIAGNOSIS — R0902 Hypoxemia: Secondary | ICD-10-CM | POA: Diagnosis not present

## 2019-11-26 LAB — BASIC METABOLIC PANEL
Anion gap: 11 (ref 5–15)
BUN: 19 mg/dL (ref 8–23)
CO2: 22 mmol/L (ref 22–32)
Calcium: 9.3 mg/dL (ref 8.9–10.3)
Chloride: 105 mmol/L (ref 98–111)
Creatinine, Ser: 1.21 mg/dL — ABNORMAL HIGH (ref 0.44–1.00)
GFR calc Af Amer: 48 mL/min — ABNORMAL LOW (ref >60)
GFR calc non Af Amer: 41 mL/min — ABNORMAL LOW (ref >60)
Glucose, Bld: 168 mg/dL — ABNORMAL HIGH (ref 70–99)
Potassium: 4 mmol/L (ref 3.5–5.1)
Sodium: 138 mmol/L (ref 135–145)

## 2019-11-26 LAB — CBC WITH DIFFERENTIAL/PLATELET
Abs Immature Granulocytes: 0.01 10*3/uL (ref 0.00–0.07)
Basophils Absolute: 0 10*3/uL (ref 0.0–0.1)
Basophils Relative: 1 %
Eosinophils Absolute: 0.1 10*3/uL (ref 0.0–0.5)
Eosinophils Relative: 1 %
HCT: 37.3 % (ref 36.0–46.0)
Hemoglobin: 12.4 g/dL (ref 12.0–15.0)
Immature Granulocytes: 0 %
Lymphocytes Relative: 17 %
Lymphs Abs: 1.3 10*3/uL (ref 0.7–4.0)
MCH: 34.4 pg — ABNORMAL HIGH (ref 26.0–34.0)
MCHC: 33.2 g/dL (ref 30.0–36.0)
MCV: 103.6 fL — ABNORMAL HIGH (ref 80.0–100.0)
Monocytes Absolute: 0.5 10*3/uL (ref 0.1–1.0)
Monocytes Relative: 6 %
Neutro Abs: 5.9 10*3/uL (ref 1.7–7.7)
Neutrophils Relative %: 75 %
Platelets: 158 10*3/uL (ref 150–400)
RBC: 3.6 MIL/uL — ABNORMAL LOW (ref 3.87–5.11)
RDW: 12.1 % (ref 11.5–15.5)
WBC: 7.7 10*3/uL (ref 4.0–10.5)
nRBC: 0 % (ref 0.0–0.2)

## 2019-11-26 LAB — URINALYSIS, ROUTINE W REFLEX MICROSCOPIC
Bilirubin Urine: NEGATIVE
Glucose, UA: 150 mg/dL — AB
Hgb urine dipstick: NEGATIVE
Ketones, ur: 5 mg/dL — AB
Leukocytes,Ua: NEGATIVE
Nitrite: NEGATIVE
Protein, ur: NEGATIVE mg/dL
Specific Gravity, Urine: 1.004 — ABNORMAL LOW (ref 1.005–1.030)
pH: 7 (ref 5.0–8.0)

## 2019-11-26 LAB — MAGNESIUM: Magnesium: 2.1 mg/dL (ref 1.7–2.4)

## 2019-11-26 LAB — TSH: TSH: 2.171 u[IU]/mL (ref 0.350–4.500)

## 2019-11-26 MED ORDER — METOPROLOL TARTRATE 25 MG PO TABS: 25.0000 mg | ORAL_TABLET | Freq: Once | ORAL | 0 refills | Status: DC | PRN

## 2019-11-26 NOTE — Discharge Instructions (Addendum)
You were seen in the emergency department for evaluation of an elevated heart rate.  The ambulance crew gave you medication and shocked your heart to put you back in a regular rhythm.  We check lab work here that was unremarkable.  Cardiology is recommending that we add an additional medication to use only as needed for elevated heart rate.  If you take this medicine you should lay down as it may drop your blood pressure.  If your heart rate does not improve within 30 minutes or if you are having any significant symptoms please call 911.  Follow-up with your cardiologist.

## 2019-11-26 NOTE — ED Provider Notes (Signed)
[facility] EMERGENCY DEPARTMENT Provider Note   CSN: [id number] Arrival date & time: 11/26/19  1219     History Chief Complaint  Patient presents with  . Chest Pain    Pt  in SVT but now in NSR    Lindsay Doyle is a 86 y.o. female.  She was well when she woke up this morning and ate breakfast.  She then felt that her heart was racing and it made her feel lightheaded like she might pass out.  She took her morning Toprol and laid in bed for about an hour but the feeling did not go away.  Was associated with some numbness in her right great toe.  They eventually called EMS and she was found to be in a narrow complex tachycardia going 200.  She was given adenosine 09/10/10 without improvement in rate other than for a few seconds.  She was shocked with 100 J synchronized twice.  She has been in sinus for the last 20 minutes.  Blood pressure was also very low initially and she received 1.5 L of saline along with some Versed and fentanyl.  Currently she denies any complaints.  The history is provided by the patient and a parent.  Palpitations Palpitations quality:  Fast Onset quality:  Sudden Timing:  Constant Progression:  Resolved Chronicity:  Recurrent Relieved by:  Nothing Worsened by:  Nothing Ineffective treatments:  None tried Associated symptoms: chest pressure, dizziness and numbness   Associated symptoms: no back pain, no chest pain, no diaphoresis, no lower extremity edema, no nausea, no shortness of breath, no syncope, no vomiting and no weakness   Risk factors: hx of atrial fibrillation        Past Medical History:  Diagnosis Date  . ANEMIA-IRON DEFICIENCY    presumed GI bleed 08/2012- anticoag stopped  . Atrial fibrillation ([place])    a. Apixaban started 03/2012 - stopped after admx for GI bleed 6/14  . Blood transfusion    a. 05/2010: 3 transfusions for anemia/GIB.  [name] CAD (coronary artery disease)    a. LHC 1/14:  mLAD serial 30 and 60%, oRCA  30%, EF 60%, Afib,   . Cataract   . COLONIC POLYPS, HX OF 03/09/2007  . COPD 03/09/2007   ? pt is unsure  . GERD (gastroesophageal reflux disease)   . GI bleed    thought to be due to AVMs  . Hx of echocardiogram    a. echo 2/11:  EF 55-60%, mild AI, mild RAE, mild to mod TR;   b.  a. Echo 2/14:  Mild focal basal and mild LVH, EF 60-65%, mild AI, mild RAE, trivia effusion  . HYPERGLYCEMIA 07/02/2007  . Hyperlipidemia   . Hypertension 11/12/2006  . LEUKOPENIA, MILD 05/30/2008  . Mitral valve prolapse    Remotely - last echo 2011 did not show this  . NSTEMI (non-ST elevated myocardial infarction) ([place]) 03/2012   a. Type II in setting of AF with RVR 03/2012  . OSTEOARTHRITIS 02/19/2009   Spinal  . OSTEOPOROSIS 11/12/2006  . Positive H. pylori test 10/1996  . PSVT    a. Remotely, in setting of anemia.  [name] Spinal stenosis of lumbar region 07/16/2012  . Transfusion history    7/15     Patient Active Problem List   Diagnosis Date Noted  . Upper back pain on left side 11/09/2019  . Left ear hearing loss 09/17/2019  . Toe pain, left 12/03/2016  . Dysuria 09/27/2016  .  High ankle sprain 09/08/2016  . Constipation 03/10/2016  . Finger injury 03/04/2016  . Chronic diastolic heart failure ([place]) 01/21/2016  . AVM (arteriovenous malformation) of small bowel, acquired with hemorrhage   . Upper GI bleed   . Eyelid abnormality 11/30/2015  . Osteoarthritis, hand 10/05/2015  . Rash 03/29/2015  . Atrial fibrillation ([place]) 02/02/2015  . Fibromyalgia 01/25/2015  . Myalgia 12/08/2014  . Helicobacter pylori ab+ 05/19/2014  . Dehydration   . Enteritis   . Abdominal pain, generalized   . Malnutrition of moderate degree ([place]) 05/10/2014  . Chronic hyponatremia 05/09/2014  . Gastroenteritis 05/09/2014  . Guaiac positive stools 05/08/2014  . Tachycardia 05/08/2014  . Occult blood positive stool 05/08/2014  . AKI (acute kidney injury) ([place]) 10/21/2013  . Acute on chronic renal failure ([place]) 10/21/2013   . UTI (lower urinary tract infection) 10/20/2013  . ARF (acute renal failure) ([place]) 10/20/2013  . Gastric AVM 10/10/2013  . Acute blood loss anemia 09/23/2013  . Dark stools 09/21/2013  . Anxiety state 04/20/2013  . Cervical spine arthritis 03/11/2013  . Neck pain on left side 02/15/2013  . Lump in neck 09/23/2012  . Symptomatic anemia 09/17/2012  . Other malaise and fatigue 09/14/2012  . Dizziness 09/14/2012  . Leg cramp 09/14/2012  . Spinal stenosis of lumbar region 07/16/2012  . Osteoporosis 04/27/2012  . Coronary Artery Disease 04/24/2012  . NSTEMI (non-ST elevated myocardial infarction) ([place]) 04/23/2012  . Preventative health care 01/08/2012  . Diarrhea 10/07/2011  . CKD (chronic kidney disease) stage 4, GFR 15-29 ml/min ([facility]) 09/30/2011  . Unspecified disorder of liver 10/22/2010  . Menopausal state 10/22/2010  . Schatzki's ring 10/22/2010  . Hip pain, left 07/23/2010  . PSVT 05/03/2009  . OSTEOARTHRITIS 02/19/2009  . ASYMPTOMATIC POSTMENOPAUSAL STATUS 07/17/2008  . LEUKOPENIA, MILD 05/30/2008  . SKIN RASH 07/02/2007  . Hyperglycemia 07/02/2007  . Hyperlipidemia 03/09/2007  . Iron deficiency anemia 03/09/2007  . MITRAL VALVE PROLAPSE 03/09/2007  . Allergic rhinitis 03/09/2007  . COPD (chronic obstructive pulmonary disease) ([place]) 03/09/2007  . History of colonic polyps 03/09/2007  . Asthma 01/18/2007  . Cough 01/18/2007  . Essential hypertension 11/12/2006    Past Surgical History:  Procedure Laterality Date  . ABDOMINAL HYSTERECTOMY    . CARDIAC CATHETERIZATION  1992   S/P in 1992 at [facility] in [place]. This was negative for any coronary artery disease  . carotid duplex  08/29/2003  . CATARACT EXTRACTION    . ceasarean    . ENTEROSCOPY N/A 10/10/2013   Procedure: ENTEROSCOPY;  Surgeon: Inda Castle, MD;  Location: [facility] ENDOSCOPY;  Service: Endoscopy;  Laterality: N/A;  . ENTEROSCOPY N/A 12/28/2014   Procedure: ENTEROSCOPY;  Surgeon:  Inda Castle, MD;  Location: [facility] ENDOSCOPY;  Service: Endoscopy;  Laterality: N/A;  . ENTEROSCOPY N/A 01/20/2016   Procedure: ENTEROSCOPY;  Surgeon: Manus Gunning, MD;  Location: [facility] ENDOSCOPY;  Service: Gastroenterology;  Laterality: N/A;  . HOT HEMOSTASIS N/A 12/28/2014   Procedure: HOT HEMOSTASIS (ARGON PLASMA COAGULATION/BICAP);  Surgeon: Inda Castle, MD;  Location: [facility] ENDOSCOPY;  Service: Endoscopy;  Laterality: N/A;  . LEFT HEART CATHETERIZATION WITH CORONARY ANGIOGRAM N/A 04/23/2012   Procedure: LEFT HEART CATHETERIZATION WITH CORONARY ANGIOGRAM;  Surgeon: Larey Dresser, MD;  Location: [facility] C[facility] LAB;  Service: Cardiovascular;  Laterality: N/A;  . TONSILLECTOMY    . TUBAL LIGATION       OB History   No obstetric history on file.  Family History  Problem Relation Age of Onset  . Prostate cancer Brother   . Stomach cancer Mother   . Sarcoidosis Sister   . Colon cancer Brother   . Sarcoidosis Other   . CAD Neg Hx     Social History   Tobacco Use  . Smoking status: Never Smoker  . Smokeless tobacco: Never Used  Vaping Use  . Vaping Use: Never used  Substance Use Topics  . Alcohol use: No  . Drug use: No    Home Medications Prior to Admission medications   Medication Sig Start Date End Date Taking? Authorizing Provider  acetaminophen (TYLENOL) 500 MG tablet Take 2 tablets (1,000 mg total) by mouth 2 (two) times daily. 10/05/15   Biagio Borg, MD  atorvastatin (LIPITOR) 20 MG tablet TAKE 1 TABLET BY MOUTH EVERY DAY 06/15/19   Biagio Borg, MD  ferrous sulfate 325 (65 FE) MG tablet TAKE 1 TABLET BY MOUTH 2 TIMES DAILY WITH A MEAL. 10/11/19   Armbruster, Carlota Raspberry, MD  metoprolol succinate (TOPROL-XL) 50 MG 24 hr tablet Take 1 tablet (50 mg total) by mouth daily. Take with or immediately following a meal. 06/08/19 11/15/20  Bhagat, Crista Luria, PA  Multiple Vitamin (MULTIVITAMIN) tablet Take 1 tablet by mouth daily.    [provider]  nitroGLYCERIN  (NITROSTAT) 0.4 MG SL tablet Place 1 tablet (0.4 mg total) under the tongue every 5 (five) minutes as needed for chest pain (maximum of 3 tablets). 12/13/18   Sherren Mocha, MD    Allergies    Doxycycline, Biaxin [clarithromycin], and Metronidazole  Review of Systems   Review of Systems  Constitutional: Negative for diaphoresis and fever.  HENT: Negative for sore throat.   Eyes: Negative for visual disturbance.  Respiratory: Negative for shortness of breath.   Cardiovascular: Positive for palpitations. Negative for chest pain and syncope.  Gastrointestinal: Negative for abdominal pain, nausea and vomiting.  Genitourinary: Negative for dysuria.  Musculoskeletal: Negative for back pain.  Skin: Negative for rash.  Neurological: Positive for dizziness and numbness. Negative for weakness.    Physical Exam Updated Vital Signs BP 102/77 (BP Location: Right Arm)   Pulse 96   Temp 97.7 F (36.5 C) (Oral)   Resp 12   SpO2 100%   Physical Exam Vitals and nursing note reviewed.  Constitutional:      General: She is not in acute distress.    Appearance: She is well-developed.  HENT:     Head: Normocephalic and atraumatic.  Eyes:     Conjunctiva/sclera: Conjunctivae normal.  Cardiovascular:     Rate and Rhythm: Normal rate and regular rhythm.     Heart sounds: Normal heart sounds. No murmur heard.   Pulmonary:     Effort: Pulmonary effort is normal. No respiratory distress.     Breath sounds: Normal breath sounds.  Abdominal:     Palpations: Abdomen is soft.     Tenderness: There is no abdominal tenderness.  Musculoskeletal:        General: Normal range of motion.     Cervical back: Neck supple.     Right lower leg: No tenderness. No edema.     Left lower leg: No tenderness. No edema.  Skin:    General: Skin is warm and dry.     Capillary Refill: Capillary refill takes less than 2 seconds.  Neurological:     General: No focal deficit present.     Mental Status: She is  alert.  ED Results / Procedures / Treatments   Labs (all labs ordered are listed, but only abnormal results are displayed) Labs Reviewed  BASIC METABOLIC PANEL - Abnormal; Notable for the following components:      Result Value   Glucose, Bld 168 (*)    Creatinine, Ser 1.21 (*)    GFR calc non Af Amer 41 (*)    GFR calc Af Amer 48 (*)    All other components within normal limits  CBC WITH DIFFERENTIAL/PLATELET - Abnormal; Notable for the following components:   RBC 3.60 (*)    MCV 103.6 (*)    MCH 34.4 (*)    All other components within normal limits  URINALYSIS, ROUTINE W REFLEX MICROSCOPIC - Abnormal; Notable for the following components:   Color, Urine STRAW (*)    Specific Gravity, Urine 1.004 (*)    Glucose, UA 150 (*)    Ketones, ur 5 (*)    All other components within normal limits  MAGNESIUM  TSH    EKG EKG Interpretation  Date/Time:  Saturday November 26 2019 12:20:56 EDT Ventricular Rate:  95 PR Interval:    QRS Duration: 100 QT Interval:  384 QTC Calculation: 483 R Axis:   67 Text Interpretation: Sinus rhythm Abnormal R-wave progression, early transition Confirmed by Aletta Edouard [id number]) on 11/26/2019 12:27:59 PM   Radiology DG Chest Port 1 View  Result Date: 11/26/2019 CLINICAL DATA:  Chest pain. EXAM: PORTABLE CHEST 1 VIEW COMPARISON:  June 01, 2019 FINDINGS: Transcutaneous pacer leads obscure the left side of the heart mediastinum as well as a large portion left lung. No pneumothorax. The lungs are clear within visualize limits. No other interval changes or acute abnormalities. IMPRESSION: The study is partially limited due to obscuration from transcutaneous pacer leads. No acute abnormalities are noted. Electronically Signed   By: Dorise Bullion III M.D   On: 11/26/2019 14:08    Procedures Procedures (including critical care time)  Medications Ordered in ED Medications - No data to display  ED Course  I have reviewed the triage vital signs  and the nursing notes.  Pertinent labs & imaging results that were available during my care of the patient were reviewed by me and considered in my medical decision making (see chart for details).  Clinical Course as of Nov 25 2232  Sat Nov 26, 2019  1412 Chest x-ray interpreted by me as no acute infiltrates no pneumothorax.   [MB]  0017 Blood pressure is soft here.  She is already received a liter and a half from EMS.  We will have them check orthostatics.  On review of prior blood pressure she usually runs between 100- 120.   [MB]  4944 Discussed with Dr. Sallyanne Kuster cardiology.  He agrees with letting the patient go home.  He asked if we would give her a prescription for metoprolol tartrate 25 to use as needed   [MB]  1552 Patient's orthostatics are good here.  Reviewed recommendations from cardiology with her and she is comfortable with plan.  Return instructions discussed.   [MB]    Clinical Course User Index [MB] Hayden Rasmussen, MD   MDM Rules/Calculators/A&P                         This patient complains of lightheadedness tachycardia; this involves an extensive number of treatment Options and is a complaint that carries with it a high risk of complications and Morbidity. The differential includes SVT, rapid  A. fib, metabolic derangement, anemia, ACS  I ordered, reviewed and interpreted labs, which included CBC with normal white count normal hemoglobin, chemistries with mild elevation of glucose, elevated creatinine stable from baseline, urinalysis without signs of infection or dehydration, normal TSH I ordered imaging studies which included chest x-ray and I independently    visualized and interpreted imaging which showed no acute findings Additional history obtained from patient's husband and EMS Previous records obtained and reviewed in epic, patient has a history of both SVT and paroxysmal A. fib I consulted Dr. Sallyanne Kuster none  cardiology and discussed lab and imaging findings.   He recommended starting the patient on a short acting as needed metoprolol tartrate as needed for tachycardia.  Critical Interventions: None  After the interventions stated above, I reevaluated the patient and found patient be hemodynamically stable and blood pressure and heart rate okay.  She is comfortable plan for outpatient follow-up with her cardiologist.  Return instructions discussed.   Final Clinical Impression(s) / ED Diagnoses Final diagnoses:  SVT (supraventricular tachycardia) ([place])    Rx / DC Orders ED Discharge Orders         Ordered    metoprolol tartrate (LOPRESSOR) 25 MG tablet  Once PRN        11/26/19 1554           Hayden Rasmussen, MD 11/26/19 2239

## 2019-11-26 NOTE — ED Triage Notes (Signed)
Pt arrived from home via [facility]. Pt in SVT upon EMS arrival 200-230 HR.EMS attempted adenosine 3 times (6,12,12) Pt cardioverted after third attempt.  During the process a total of 1.5 L of NS, 2.5 versed and 50 fentanyl given. Pt 122/76 upon arrival to Hospital.

## 2019-11-28 ENCOUNTER — Telehealth: Payer: Self-pay | Admitting: Internal Medicine

## 2019-11-28 NOTE — Telephone Encounter (Signed)
   Patient requesting refill on ferrous sulfate 325 (65 FE) MG tablet. Patient no longer seeing Dr Havery Moros.  Patient states she recently had labs done in ED. Requesting Dr Jenny Reichmann write new order

## 2019-11-29 ENCOUNTER — Other Ambulatory Visit: Payer: Self-pay

## 2019-11-29 MED ORDER — FERROUS SULFATE 325 (65 FE) MG PO TABS: ORAL_TABLET | ORAL | 0 refills | Status: DC

## 2019-11-29 NOTE — Progress Notes (Addendum)
Cardiology Office Note:    Date:  11/30/2019   ID:  Ozella Almond B. [place], Righetti 07-Jan-1934, MRN [id number]  PCP:  Biagio Borg, MD  [facility] Cardiologist:  Sherren Mocha, MD   [place] Electrophysiologist:  None   Referring MD: Biagio Borg, MD   Chief Complaint:  Hospitalization Follow-up (Seen in ED 8/28 for SVT)    Patient Profile:    Lindsay Doyle is a 86 y.o. female with:   Paroxysmal atrial fibrillation  S/p NSTEMI (demand ischemia) in setting of AF w RVR in 1/14 >> spontaneous conv to NSR  No longer on anticoagulation due to GI bleeding  Coronary artery disease   Non-obs dz by cath in 2014  Hx of GI bleed  S/p cauterization of SB AVMs  Hyperlipidemia   Anemia   Hypertension   Chronic kidney disease  Spinal stenosis  Hx of PSVT   Prior CV studies: Echocardiogram 01/20/16 Mild LVH, EF 65-70, no RWMA, Gr 1 DD, mild AI, mod TR, PASP 36, Trivial eff  Cardiac catheterization 1/14 LAD serial 30 and 60 RCA ost 30 EF 60  History of Present Illness:    Ms. Nienhuis was last seen in clinic by Leanor Kail, PA-C in 05/2019 after a visit to the ED with AFib with RVR.  She remained off of anticoagulation due to prior GI bleeding.  Her Metoprolol succinate was increased.  She was recently in the ED for SVT with reported HRs of 200.  She failed conversion with adenosine and underwent synchronized cardioversion.  The ED physician spoke with Dr. Sallyanne Kuster for Cardiology (by phone) and recommendation was to DC her on metoprolol tartrate prn.  She returns for follow up.  She is here with her husband.  She has not had further palpitations since she was in the ED.  She exercises several times a week.  She has not had chest pain, shortness of breath, syncope, leg swelling.          Past Medical History:  Diagnosis Date  . ANEMIA-IRON DEFICIENCY    presumed GI bleed 08/2012- anticoag stopped  . Atrial fibrillation ([place])    a. Apixaban started 03/2012 - stopped  after admx for GI bleed 6/14  . Blood transfusion    a. 05/2010: 3 transfusions for anemia/GIB.  [name] CAD (coronary artery disease)    a. LHC 1/14:  mLAD serial 30 and 60%, oRCA 30%, EF 60%, Afib,   . Cataract   . COLONIC POLYPS, HX OF 03/09/2007  . COPD 03/09/2007   ? pt is unsure  . GERD (gastroesophageal reflux disease)   . GI bleed    thought to be due to AVMs  . Hx of echocardiogram    a. echo 2/11:  EF 55-60%, mild AI, mild RAE, mild to mod TR;   b.  a. Echo 2/14:  Mild focal basal and mild LVH, EF 60-65%, mild AI, mild RAE, trivia effusion  . HYPERGLYCEMIA 07/02/2007  . Hyperlipidemia   . Hypertension 11/12/2006  . LEUKOPENIA, MILD 05/30/2008  . Mitral valve prolapse    Remotely - last echo 2011 did not show this  . NSTEMI (non-ST elevated myocardial infarction) ([place]) 03/2012   a. Type II in setting of AF with RVR 03/2012  . OSTEOARTHRITIS 02/19/2009   Spinal  . OSTEOPOROSIS 11/12/2006  . Positive H. pylori test 10/1996  . PSVT    a. Remotely, in setting of anemia.  [name] Spinal stenosis of lumbar region 07/16/2012  . Transfusion  history    7/15     Current Medications: Current Meds  Medication Sig  . acetaminophen (TYLENOL) 500 MG tablet Take 2 tablets (1,000 mg total) by mouth 2 (two) times daily.  [name] atorvastatin (LIPITOR) 20 MG tablet TAKE 1 TABLET BY MOUTH EVERY DAY  . ferrous sulfate 325 (65 FE) MG tablet TAKE 1 TABLET BY MOUTH 2 TIMES DAILY WITH A MEAL.  . metoprolol succinate (TOPROL-XL) 50 MG 24 hr tablet Take 1.5 tablets (75 mg total) by mouth daily. Take with or immediately following a meal.  . Multiple Vitamin (MULTIVITAMIN WITH MINERALS) TABS tablet Take 1 tablet by mouth daily. Centrum Silver  . nitroGLYCERIN (NITROSTAT) 0.4 MG SL tablet Place 1 tablet (0.4 mg total) under the tongue every 5 (five) minutes as needed for chest pain (maximum of 3 tablets).  . [DISCONTINUED] metoprolol succinate (TOPROL-XL) 50 MG 24 hr tablet Take 1 tablet (50 mg total) by mouth daily. Take  with or immediately following a meal.     Allergies:   Doxycycline, Biaxin [clarithromycin], and Metronidazole   Social History   Tobacco Use  . Smoking status: Never Smoker  . Smokeless tobacco: Never Used  Vaping Use  . Vaping Use: Never used  Substance Use Topics  . Alcohol use: No  . Drug use: No     Family Hx: The patient's family history includes Colon cancer in her brother; Prostate cancer in her brother; Sarcoidosis in her sister and another family member; Stomach cancer in her mother. There is no history of CAD.  Review of Systems  Gastrointestinal: Negative for hematochezia and melena.     EKGs/Labs/Other Test Reviewed:    EKG:  EKG is   ordered today.  The ekg ordered today demonstrates normal sinus rhythm, heart rate 98, normal axis, nonspecific ST-T wave changes, QTC 472  Recent Labs: 09/16/2019: ALT 14 11/26/2019: BUN 19; Creatinine, Ser 1.21; Hemoglobin 12.4; Magnesium 2.1; Platelets 158; Potassium 4.0; Sodium 138; TSH 2.171   Recent Lipid Panel Lab Results  Component Value Date/Time   CHOL 170 09/16/2019 02:51 PM   TRIG 125.0 09/16/2019 02:51 PM   HDL 69.70 09/16/2019 02:51 PM   CHOLHDL 2 09/16/2019 02:51 PM   [place] 75 09/16/2019 02:51 PM   LDLDIRECT 106.0 03/18/2019 11:37 AM    Physical Exam:    VS:  BP 132/70   Pulse 98   Ht 5\' 4"  (1.626 m)   Wt 113 lb (51.3 kg)   SpO2 95%   BMI 19.40 kg/m     Wt Readings from Last 3 Encounters:  11/30/19 113 lb (51.3 kg)  11/26/19 113 lb (51.3 kg)  11/16/19 113 lb (51.3 kg)     Constitutional:      Appearance: Healthy appearance. Not in distress.  Neck:     Vascular: No JVR.  Pulmonary:     Effort: Pulmonary effort is normal.     Breath sounds: No wheezing. No rales.  Cardiovascular:     Normal rate. Regular rhythm. Normal S1. Normal S2.     Murmurs: There is a grade 2/6 crescendo-decrescendo systolic murmur at the URSB.  Edema:    Peripheral edema absent.  Abdominal:     Palpations: Abdomen  is soft.  Musculoskeletal:     Cervical back: Neck supple. Skin:    General: Skin is warm and dry.  Neurological:     Mental Status: Alert and oriented to person, place and time.     Cranial Nerves: Cranial nerves are intact.  ASSESSMENT & PLAN:    1. Paroxysmal atrial fibrillation (HCC) 2. PSVT She was previously taken off of anticoagulation secondary to history of GI bleeding.  She has not really had documented atrial fibrillation in years.  She had an admission in 2014 with atrial fibrillation with rapid rate complicated by demand ischemia.  Now, she has recently been to the emergency room with paroxysmal supraventricular tachycardia.  Her heart rate was noted to be in the 200s.  I do not have the strips of her SVT available for review.  We will try to locate those.  Of note, she did have cauterization of small bowel AVMs in the past.  Her most recent hemoglobin was normal.  She has not really had recurrent bleeding.  Her CHA2DS2-VASc=5 indicating an annual stroke risk of 7.2%.  If she has recurrent atrial fibrillation, she would benefit from anticoagulation.  I am not certain she is a good long-term candidate for anticoagulation.  However, she may be able to tolerate short term anticoagulation.  Today, we discussed the difference between atrial fibrillation and supraventricular tachycardia.  Her recent episode of SVT was difficult to convert requiring 3 rounds of adenosine and 2 synchronized cardioversions.  We discussed the rationale for medical therapy versus ablative therapy.  I think she would benefit from EP consultation to determine (a) if she is a candidate for/should consider the Watchman device and (b) if she should consider RF catheter ablation for SVT.  -Increase Metoprolol succinate to 75 mg once daily   -Rx for metoprolol tartrate 25 mg prn for rapid palpitations  -Refer to Dr. Quentin Ore for EP consultation   -Obtain follow up echocardiogram   3. Coronary artery disease  involving native coronary artery of native heart without angina pectoris Mild nonobstructive disease by prior cath. No angina.  Continue atorvastatin.  She is not on ASA due hx of GI bleeding   4. Essential hypertension Fair control. Adjust metoprolol succinate as noted.  5. Murmur She has a murmur that sounds c/w AS.  Last echocardiogram in 2017 with mild AI, mod TR.  I will obtain a follow up echocardiogram.     Dispo:  Return in about 6 months (around 05/29/2020) for Routine Follow Up, w/ Dr. Burt Knack, or [name], PA-C, in person.   Medication Adjustments/Labs and Tests Ordered: Current medicines are reviewed at length with the patient today.  Concerns regarding medicines are outlined above.  Tests Ordered: Orders Placed This Encounter  Procedures  . Ambulatory referral to Cardiac Electrophysiology  . EKG 12-Lead  . ECHOCARDIOGRAM COMPLETE   Medication Changes: Meds ordered this encounter  Medications  . metoprolol tartrate (LOPRESSOR) 25 MG tablet    Sig: Take 1 tablet (25 mg total) by mouth once as needed for up to 1 dose (palpitations).    Dispense:  30 tablet    Refill:  2  . metoprolol succinate (TOPROL-XL) 50 MG 24 hr tablet    Sig: Take 1.5 tablets (75 mg total) by mouth daily. Take with or immediately following a meal.    Dispense:  135 tablet    Refill:  3    Signed, [name], PA-C  11/30/2019 12:23 PM    [facility] [place], [place], [place]  [address] Phone: [phone]; Fax: [phone]   Addendum:  Received ECG/rhyhtm strip from [facility].  Her rhythm appears to be AF w RVR (HR 180).  She canceled appt with Dr. Quentin Ore (EP).  We will try to encourage  her to reschedule.  If not, we can see her back and consider Amiodarone to reduce the risk of recurrent AFib.   [name], PA-C    12/07/2019 5:00 PM

## 2019-11-29 NOTE — Telephone Encounter (Signed)
Sent erx

## 2019-11-30 ENCOUNTER — Other Ambulatory Visit: Payer: Self-pay

## 2019-11-30 ENCOUNTER — Ambulatory Visit (INDEPENDENT_AMBULATORY_CARE_PROVIDER_SITE_OTHER): Payer: Medicare Other | Admitting: Physician Assistant

## 2019-11-30 ENCOUNTER — Encounter: Payer: Self-pay | Admitting: Physician Assistant

## 2019-11-30 VITALS — BP 132/70 | HR 98 | Ht 64.0 in | Wt 113.0 lb

## 2019-11-30 DIAGNOSIS — I471 Supraventricular tachycardia: Secondary | ICD-10-CM

## 2019-11-30 DIAGNOSIS — I10 Essential (primary) hypertension: Secondary | ICD-10-CM | POA: Diagnosis not present

## 2019-11-30 DIAGNOSIS — R011 Cardiac murmur, unspecified: Secondary | ICD-10-CM

## 2019-11-30 DIAGNOSIS — I480 Paroxysmal atrial fibrillation: Secondary | ICD-10-CM

## 2019-11-30 DIAGNOSIS — I2510 Atherosclerotic heart disease of native coronary artery without angina pectoris: Secondary | ICD-10-CM

## 2019-11-30 MED ORDER — METOPROLOL TARTRATE 25 MG PO TABS: 25.0000 mg | ORAL_TABLET | Freq: Once | ORAL | 2 refills | Status: DC | PRN

## 2019-11-30 MED ORDER — METOPROLOL SUCCINATE ER 50 MG PO TB24: 75.0000 mg | ORAL_TABLET | Freq: Every day | ORAL | 3 refills | Status: DC

## 2019-11-30 NOTE — Patient Instructions (Addendum)
Medication Instructions:  Your physician has recommended you make the following change in your medication:   1) Increase Toprol XL to 75 mg, 1.5 tablets by mouth once a day 2) Take Metorprolol Tartrate 25 mg, 1 tablet as needed for rapid palpitations  *If you need a refill on your cardiac medications before your next appointment, please call your pharmacy*  Lab Work: None ordered today  If you have labs (blood work) drawn today and your tests are completely normal, you will receive your results only by: [name] MyChart Message (if you have MyChart) OR . A paper copy in the mail If you have any lab test that is abnormal or we need to change your treatment, we will call you to review the results.  Testing/Procedures: Your physician has requested that you have an echocardiogram. Echocardiography is a painless test that uses sound waves to create images of your heart. It provides your doctor with information about the size and shape of your heart and how well your heart's chambers and valves are working. This procedure takes approximately one hour. There are no restrictions for this procedure.  Follow-Up: On 06/06/2020 at 10:00AM with Sherren Mocha, MD

## 2019-12-06 ENCOUNTER — Institutional Professional Consult (permissible substitution): Payer: Medicare Other | Admitting: Cardiology

## 2019-12-07 ENCOUNTER — Telehealth: Payer: Self-pay | Admitting: Physician Assistant

## 2019-12-07 NOTE — Telephone Encounter (Signed)
I reviewed the patient's ECG from [facility]. It appears she was likely in AFib with RVR (HR 180s). She was to see EP (Dr. Quentin Ore) earlier this week but canceled. Please call patient and encourage her to reschedule.  Options are limited to control her AFib and she has not been able to take anticoagulation. If she does not reschedule with Dr. Quentin Ore, schedule earlier follow up with Dr. Burt Knack or me in the next few weeks.   [name], PA-C    12/07/2019 5:08 PM

## 2019-12-08 ENCOUNTER — Inpatient Hospital Stay (HOSPITAL_COMMUNITY)
Admission: EM | Admit: 2019-12-08 | Discharge: 2019-12-10 | DRG: 309 | Disposition: A | Discharge status: Home / Self Care | Payer: Medicare Other | Attending: Internal Medicine | Admitting: Internal Medicine

## 2019-12-08 ENCOUNTER — Emergency Department (HOSPITAL_COMMUNITY): Payer: Medicare Other

## 2019-12-08 ENCOUNTER — Other Ambulatory Visit: Payer: Self-pay

## 2019-12-08 ENCOUNTER — Encounter (HOSPITAL_COMMUNITY): Payer: Self-pay | Admitting: Emergency Medicine

## 2019-12-08 DIAGNOSIS — M810 Age-related osteoporosis without current pathological fracture: Secondary | ICD-10-CM | POA: Diagnosis present

## 2019-12-08 DIAGNOSIS — Z79899 Other long term (current) drug therapy: Secondary | ICD-10-CM

## 2019-12-08 DIAGNOSIS — I10 Essential (primary) hypertension: Secondary | ICD-10-CM | POA: Diagnosis present

## 2019-12-08 DIAGNOSIS — E785 Hyperlipidemia, unspecified: Secondary | ICD-10-CM | POA: Diagnosis present

## 2019-12-08 DIAGNOSIS — K219 Gastro-esophageal reflux disease without esophagitis: Secondary | ICD-10-CM | POA: Diagnosis present

## 2019-12-08 DIAGNOSIS — Z800 Family history of malignant neoplasm of digestive organs: Secondary | ICD-10-CM

## 2019-12-08 DIAGNOSIS — I252 Old myocardial infarction: Secondary | ICD-10-CM

## 2019-12-08 DIAGNOSIS — I480 Paroxysmal atrial fibrillation: Secondary | ICD-10-CM

## 2019-12-08 DIAGNOSIS — I130 Hypertensive heart and chronic kidney disease with heart failure and stage 1 through stage 4 chronic kidney disease, or unspecified chronic kidney disease: Secondary | ICD-10-CM | POA: Diagnosis present

## 2019-12-08 DIAGNOSIS — R002 Palpitations: Secondary | ICD-10-CM

## 2019-12-08 DIAGNOSIS — I2510 Atherosclerotic heart disease of native coronary artery without angina pectoris: Secondary | ICD-10-CM | POA: Diagnosis present

## 2019-12-08 DIAGNOSIS — Z20822 Contact with and (suspected) exposure to covid-19: Secondary | ICD-10-CM | POA: Diagnosis present

## 2019-12-08 DIAGNOSIS — N1831 Chronic kidney disease, stage 3a: Secondary | ICD-10-CM | POA: Diagnosis present

## 2019-12-08 DIAGNOSIS — I5032 Chronic diastolic (congestive) heart failure: Secondary | ICD-10-CM | POA: Diagnosis not present

## 2019-12-08 DIAGNOSIS — I471 Supraventricular tachycardia, unspecified: Secondary | ICD-10-CM | POA: Diagnosis present

## 2019-12-08 DIAGNOSIS — R778 Other specified abnormalities of plasma proteins: Secondary | ICD-10-CM | POA: Diagnosis present

## 2019-12-08 DIAGNOSIS — R7989 Other specified abnormal findings of blood chemistry: Secondary | ICD-10-CM | POA: Diagnosis present

## 2019-12-08 DIAGNOSIS — I341 Nonrheumatic mitral (valve) prolapse: Secondary | ICD-10-CM | POA: Diagnosis present

## 2019-12-08 DIAGNOSIS — M797 Fibromyalgia: Secondary | ICD-10-CM | POA: Diagnosis present

## 2019-12-08 DIAGNOSIS — N183 Chronic kidney disease, stage 3 unspecified: Secondary | ICD-10-CM | POA: Diagnosis present

## 2019-12-08 DIAGNOSIS — Z888 Allergy status to other drugs, medicaments and biological substances status: Secondary | ICD-10-CM

## 2019-12-08 DIAGNOSIS — Z881 Allergy status to other antibiotic agents status: Secondary | ICD-10-CM

## 2019-12-08 DIAGNOSIS — E871 Hypo-osmolality and hyponatremia: Secondary | ICD-10-CM | POA: Diagnosis not present

## 2019-12-08 DIAGNOSIS — Z8042 Family history of malignant neoplasm of prostate: Secondary | ICD-10-CM

## 2019-12-08 DIAGNOSIS — M479 Spondylosis, unspecified: Secondary | ICD-10-CM | POA: Diagnosis present

## 2019-12-08 DIAGNOSIS — I4891 Unspecified atrial fibrillation: Secondary | ICD-10-CM | POA: Diagnosis present

## 2019-12-08 DIAGNOSIS — I517 Cardiomegaly: Secondary | ICD-10-CM | POA: Diagnosis not present

## 2019-12-08 LAB — BASIC METABOLIC PANEL
Anion gap: 11 (ref 5–15)
BUN: 14 mg/dL (ref 8–23)
CO2: 23 mmol/L (ref 22–32)
Calcium: 9.3 mg/dL (ref 8.9–10.3)
Chloride: 94 mmol/L — ABNORMAL LOW (ref 98–111)
Creatinine, Ser: 1.12 mg/dL — ABNORMAL HIGH (ref 0.44–1.00)
GFR calc Af Amer: 52 mL/min — ABNORMAL LOW (ref >60)
GFR calc non Af Amer: 45 mL/min — ABNORMAL LOW (ref >60)
Glucose, Bld: 175 mg/dL — ABNORMAL HIGH (ref 70–99)
Potassium: 4.6 mmol/L (ref 3.5–5.1)
Sodium: 128 mmol/L — ABNORMAL LOW (ref 135–145)

## 2019-12-08 LAB — CBC
HCT: 38.3 % (ref 36.0–46.0)
Hemoglobin: 12.5 g/dL (ref 12.0–15.0)
MCH: 34.2 pg — ABNORMAL HIGH (ref 26.0–34.0)
MCHC: 32.6 g/dL (ref 30.0–36.0)
MCV: 104.6 fL — ABNORMAL HIGH (ref 80.0–100.0)
Platelets: 230 10*3/uL (ref 150–400)
RBC: 3.66 MIL/uL — ABNORMAL LOW (ref 3.87–5.11)
RDW: 12.3 % (ref 11.5–15.5)
WBC: 6.3 10*3/uL (ref 4.0–10.5)
nRBC: 0 % (ref 0.0–0.2)

## 2019-12-08 LAB — TROPONIN I (HIGH SENSITIVITY)
Troponin I (High Sensitivity): 22 ng/L — ABNORMAL HIGH (ref <18)
Troponin I (High Sensitivity): 60 ng/L — ABNORMAL HIGH (ref <18)

## 2019-12-08 NOTE — Telephone Encounter (Signed)
I called and left patient a detailed message with Scott's recommendations to reschedule with Dr. Quentin Ore or to call and schedule a follow up with Nicki Reaper or Dr. Burt Knack in the next few weeks, I did stress in the detailed message how important this is to have this appointment rescheduled or to see Dr. Burt Knack or Nicki Reaper sooner. Ok to leave message per patient DPR. Advised patient to call [phone] to set up appointments.

## 2019-12-08 NOTE — ED Triage Notes (Signed)
Pt reports palpitations that started today.  States she was seen here recently for same and took medication as instructed and thinks it is getting better now.  Denies chest pain and SOB.

## 2019-12-09 ENCOUNTER — Encounter (HOSPITAL_COMMUNITY): Payer: Self-pay | Admitting: Emergency Medicine

## 2019-12-09 ENCOUNTER — Observation Stay (HOSPITAL_COMMUNITY): Payer: Medicare Other

## 2019-12-09 DIAGNOSIS — R778 Other specified abnormalities of plasma proteins: Secondary | ICD-10-CM | POA: Diagnosis not present

## 2019-12-09 DIAGNOSIS — I341 Nonrheumatic mitral (valve) prolapse: Secondary | ICD-10-CM | POA: Diagnosis present

## 2019-12-09 DIAGNOSIS — I10 Essential (primary) hypertension: Secondary | ICD-10-CM | POA: Diagnosis not present

## 2019-12-09 DIAGNOSIS — R0602 Shortness of breath: Secondary | ICD-10-CM | POA: Diagnosis not present

## 2019-12-09 DIAGNOSIS — Z881 Allergy status to other antibiotic agents status: Secondary | ICD-10-CM | POA: Diagnosis not present

## 2019-12-09 DIAGNOSIS — I471 Supraventricular tachycardia: Secondary | ICD-10-CM

## 2019-12-09 DIAGNOSIS — Z20822 Contact with and (suspected) exposure to covid-19: Secondary | ICD-10-CM | POA: Diagnosis present

## 2019-12-09 DIAGNOSIS — I130 Hypertensive heart and chronic kidney disease with heart failure and stage 1 through stage 4 chronic kidney disease, or unspecified chronic kidney disease: Secondary | ICD-10-CM | POA: Diagnosis present

## 2019-12-09 DIAGNOSIS — I252 Old myocardial infarction: Secondary | ICD-10-CM | POA: Diagnosis not present

## 2019-12-09 DIAGNOSIS — I480 Paroxysmal atrial fibrillation: Secondary | ICD-10-CM | POA: Diagnosis present

## 2019-12-09 DIAGNOSIS — N1831 Chronic kidney disease, stage 3a: Secondary | ICD-10-CM | POA: Diagnosis present

## 2019-12-09 DIAGNOSIS — I5032 Chronic diastolic (congestive) heart failure: Secondary | ICD-10-CM | POA: Diagnosis present

## 2019-12-09 DIAGNOSIS — K219 Gastro-esophageal reflux disease without esophagitis: Secondary | ICD-10-CM | POA: Diagnosis present

## 2019-12-09 DIAGNOSIS — I2510 Atherosclerotic heart disease of native coronary artery without angina pectoris: Secondary | ICD-10-CM

## 2019-12-09 DIAGNOSIS — R002 Palpitations: Secondary | ICD-10-CM | POA: Diagnosis not present

## 2019-12-09 DIAGNOSIS — Z888 Allergy status to other drugs, medicaments and biological substances status: Secondary | ICD-10-CM | POA: Diagnosis not present

## 2019-12-09 DIAGNOSIS — M797 Fibromyalgia: Secondary | ICD-10-CM | POA: Diagnosis present

## 2019-12-09 DIAGNOSIS — Z8042 Family history of malignant neoplasm of prostate: Secondary | ICD-10-CM | POA: Diagnosis not present

## 2019-12-09 DIAGNOSIS — E785 Hyperlipidemia, unspecified: Secondary | ICD-10-CM | POA: Diagnosis present

## 2019-12-09 DIAGNOSIS — Z800 Family history of malignant neoplasm of digestive organs: Secondary | ICD-10-CM | POA: Diagnosis not present

## 2019-12-09 DIAGNOSIS — M479 Spondylosis, unspecified: Secondary | ICD-10-CM | POA: Diagnosis present

## 2019-12-09 DIAGNOSIS — E871 Hypo-osmolality and hyponatremia: Secondary | ICD-10-CM

## 2019-12-09 DIAGNOSIS — I361 Nonrheumatic tricuspid (valve) insufficiency: Secondary | ICD-10-CM | POA: Diagnosis not present

## 2019-12-09 DIAGNOSIS — M810 Age-related osteoporosis without current pathological fracture: Secondary | ICD-10-CM | POA: Diagnosis present

## 2019-12-09 DIAGNOSIS — I351 Nonrheumatic aortic (valve) insufficiency: Secondary | ICD-10-CM | POA: Diagnosis not present

## 2019-12-09 DIAGNOSIS — I350 Nonrheumatic aortic (valve) stenosis: Secondary | ICD-10-CM | POA: Diagnosis not present

## 2019-12-09 DIAGNOSIS — Z79899 Other long term (current) drug therapy: Secondary | ICD-10-CM | POA: Diagnosis not present

## 2019-12-09 LAB — CBC
HCT: 40.1 % (ref 36.0–46.0)
Hemoglobin: 13.5 g/dL (ref 12.0–15.0)
MCH: 33.7 pg (ref 26.0–34.0)
MCHC: 33.7 g/dL (ref 30.0–36.0)
MCV: 100 fL (ref 80.0–100.0)
Platelets: 258 10*3/uL (ref 150–400)
RBC: 4.01 MIL/uL (ref 3.87–5.11)
RDW: 11.9 % (ref 11.5–15.5)
WBC: 6 10*3/uL (ref 4.0–10.5)
nRBC: 0 % (ref 0.0–0.2)

## 2019-12-09 LAB — OSMOLALITY, URINE: Osmolality, Ur: 159 mOsm/kg — ABNORMAL LOW (ref 300–900)

## 2019-12-09 LAB — BASIC METABOLIC PANEL
Anion gap: 14 (ref 5–15)
BUN: 11 mg/dL (ref 8–23)
CO2: 24 mmol/L (ref 22–32)
Calcium: 10 mg/dL (ref 8.9–10.3)
Chloride: 98 mmol/L (ref 98–111)
Creatinine, Ser: 0.86 mg/dL (ref 0.44–1.00)
GFR calc Af Amer: >60 mL/min (ref >60)
GFR calc non Af Amer: >60 mL/min (ref >60)
Glucose, Bld: 115 mg/dL — ABNORMAL HIGH (ref 70–99)
Potassium: 3.9 mmol/L (ref 3.5–5.1)
Sodium: 136 mmol/L (ref 135–145)

## 2019-12-09 LAB — TROPONIN I (HIGH SENSITIVITY): Troponin I (High Sensitivity): 51 ng/L — ABNORMAL HIGH (ref <18)

## 2019-12-09 LAB — SARS CORONAVIRUS 2 BY RT PCR (HOSPITAL ORDER, PERFORMED IN ~~LOC~~ HOSPITAL LAB): SARS Coronavirus 2: NEGATIVE

## 2019-12-09 LAB — SODIUM, URINE, RANDOM: Sodium, Ur: 49 mmol/L

## 2019-12-09 MED ORDER — SODIUM CHLORIDE 0.9% FLUSH: 3.0000 mL | INTRAVENOUS | Status: DC | PRN

## 2019-12-09 MED ORDER — SODIUM CHLORIDE 0.9% FLUSH
3.0000 mL | Freq: Two times a day (BID) | INTRAVENOUS | Status: DC
  Administered 2019-12-09: 3 mL via INTRAVENOUS

## 2019-12-09 MED ORDER — ENOXAPARIN SODIUM 40 MG/0.4ML ~~LOC~~ SOLN
40.0000 mg | Freq: Every day | SUBCUTANEOUS | Status: DC
  Administered 2019-12-09 – 2019-12-10 (×2): 40 mg via SUBCUTANEOUS
  Filled 2019-12-09 (×2): qty 0.4

## 2019-12-09 MED ORDER — SENNOSIDES-DOCUSATE SODIUM 8.6-50 MG PO TABS: 1.0000 | ORAL_TABLET | Freq: Every evening | ORAL | Status: DC | PRN

## 2019-12-09 MED ORDER — METOPROLOL SUCCINATE ER 50 MG PO TB24
75.0000 mg | ORAL_TABLET | Freq: Every day | ORAL | Status: DC
  Administered 2019-12-09 – 2019-12-10 (×2): 75 mg via ORAL
  Filled 2019-12-09: qty 3
  Filled 2019-12-09: qty 1

## 2019-12-09 MED ORDER — ATORVASTATIN CALCIUM 10 MG PO TABS
20.0000 mg | ORAL_TABLET | Freq: Every day | ORAL | Status: DC
  Administered 2019-12-09: 10 mg via ORAL
  Filled 2019-12-09 (×3): qty 2

## 2019-12-09 MED ORDER — ASPIRIN 81 MG PO CHEW
324.0000 mg | CHEWABLE_TABLET | Freq: Once | ORAL | Status: AC
  Administered 2019-12-09: 324 mg via ORAL
  Filled 2019-12-09: qty 4

## 2019-12-09 MED ORDER — ONDANSETRON HCL 4 MG PO TABS: 4.0000 mg | ORAL_TABLET | Freq: Four times a day (QID) | ORAL | Status: DC | PRN

## 2019-12-09 MED ORDER — ACETAMINOPHEN 325 MG PO TABS: 650.0000 mg | ORAL_TABLET | Freq: Four times a day (QID) | ORAL | Status: DC | PRN

## 2019-12-09 MED ORDER — ACETAMINOPHEN 650 MG RE SUPP: 650.0000 mg | Freq: Four times a day (QID) | RECTAL | Status: DC | PRN

## 2019-12-09 MED ORDER — ONDANSETRON HCL 4 MG/2ML IJ SOLN: 4.0000 mg | Freq: Four times a day (QID) | INTRAMUSCULAR | Status: DC | PRN

## 2019-12-09 MED ORDER — SODIUM CHLORIDE 0.9 % IV SOLN: 250.0000 mL | INTRAVENOUS | Status: DC | PRN

## 2019-12-09 MED ORDER — IOHEXOL 350 MG/ML SOLN
100.0000 mL | Freq: Once | INTRAVENOUS | Status: AC | PRN
  Administered 2019-12-09: 100 mL via INTRAVENOUS

## 2019-12-09 NOTE — ED Provider Notes (Signed)
[facility] EMERGENCY DEPARTMENT Provider Note   CSN: [id number] Arrival date & time: 12/08/19  1656     History Chief Complaint  Patient presents with  . Palpitations    Lindsay Doyle is a 86 y.o. female.  The history is provided by the patient.  Palpitations Palpitations quality:  Fast Onset quality:  Sudden Timing:  Constant Progression:  Resolved Chronicity:  Recurrent Context: not blood loss and not exercise   Worsened by:  Nothing Ineffective treatments:  Beta blockers Associated symptoms: no back pain, no cough, no dizziness, no leg pain, no lower extremity edema, no near-syncope, no shortness of breath, no syncope and no vomiting   Risk factors: no hx of PE   Patient with a h/o CAD and AFIB not on anticoagulation presents with palpitations at rest.  No SOb.  No cough. No f/c/r.  States she was afraid as she has been having increased frequency of palpitations .  Took a dose of metoprolol.   She is currently symptom free.  Her husband stated patient had "indigestion" but patient is denying that at this time.      Past Medical History:  Diagnosis Date  . ANEMIA-IRON DEFICIENCY    presumed GI bleed 08/2012- anticoag stopped  . Atrial fibrillation ([place])    a. Apixaban started 03/2012 - stopped after admx for GI bleed 6/14  . Blood transfusion    a. 05/2010: 3 transfusions for anemia/GIB.  [name] CAD (coronary artery disease)    a. LHC 1/14:  mLAD serial 30 and 60%, oRCA 30%, EF 60%, Afib,   . Cataract   . COLONIC POLYPS, HX OF 03/09/2007  . COPD 03/09/2007   ? pt is unsure  . GERD (gastroesophageal reflux disease)   . GI bleed    thought to be due to AVMs  . Hx of echocardiogram    a. echo 2/11:  EF 55-60%, mild AI, mild RAE, mild to mod TR;   b.  a. Echo 2/14:  Mild focal basal and mild LVH, EF 60-65%, mild AI, mild RAE, trivia effusion  . HYPERGLYCEMIA 07/02/2007  . Hyperlipidemia   . Hypertension 11/12/2006  . LEUKOPENIA, MILD 05/30/2008  . Mitral  valve prolapse    Remotely - last echo 2011 did not show this  . NSTEMI (non-ST elevated myocardial infarction) ([place]) 03/2012   a. Type II in setting of AF with RVR 03/2012  . OSTEOARTHRITIS 02/19/2009   Spinal  . OSTEOPOROSIS 11/12/2006  . Positive H. pylori test 10/1996  . PSVT    a. Remotely, in setting of anemia.  [name] Spinal stenosis of lumbar region 07/16/2012  . Transfusion history    7/15     Patient Active Problem List   Diagnosis Date Noted  . Upper back pain on left side 11/09/2019  . Left ear hearing loss 09/17/2019  . Toe pain, left 12/03/2016  . Dysuria 09/27/2016  . High ankle sprain 09/08/2016  . Constipation 03/10/2016  . Finger injury 03/04/2016  . Chronic diastolic heart failure ([place]) 01/21/2016  . AVM (arteriovenous malformation) of small bowel, acquired with hemorrhage   . Upper GI bleed   . Eyelid abnormality 11/30/2015  . Osteoarthritis, hand 10/05/2015  . Rash 03/29/2015  . Atrial fibrillation ([place]) 02/02/2015  . Fibromyalgia 01/25/2015  . Myalgia 12/08/2014  . Helicobacter pylori ab+ 05/19/2014  . Dehydration   . Enteritis   . Abdominal pain, generalized   . Malnutrition of moderate degree ([place]) 05/10/2014  . Chronic hyponatremia  05/09/2014  . Gastroenteritis 05/09/2014  . Guaiac positive stools 05/08/2014  . Tachycardia 05/08/2014  . Occult blood positive stool 05/08/2014  . AKI (acute kidney injury) ([place]) 10/21/2013  . Acute on chronic renal failure ([place]) 10/21/2013  . UTI (lower urinary tract infection) 10/20/2013  . ARF (acute renal failure) ([place]) 10/20/2013  . Gastric AVM 10/10/2013  . Acute blood loss anemia 09/23/2013  . Dark stools 09/21/2013  . Anxiety state 04/20/2013  . Cervical spine arthritis 03/11/2013  . Neck pain on left side 02/15/2013  . Lump in neck 09/23/2012  . Symptomatic anemia 09/17/2012  . Other malaise and fatigue 09/14/2012  . Dizziness 09/14/2012  . Leg cramp 09/14/2012  . Spinal stenosis of lumbar region  07/16/2012  . Osteoporosis 04/27/2012  . Coronary Artery Disease 04/24/2012  . NSTEMI (non-ST elevated myocardial infarction) ([place]) 04/23/2012  . Preventative health care 01/08/2012  . Diarrhea 10/07/2011  . CKD (chronic kidney disease) stage 4, GFR 15-29 ml/min ([facility]) 09/30/2011  . Unspecified disorder of liver 10/22/2010  . Menopausal state 10/22/2010  . Schatzki's ring 10/22/2010  . Hip pain, left 07/23/2010  . PSVT 05/03/2009  . OSTEOARTHRITIS 02/19/2009  . ASYMPTOMATIC POSTMENOPAUSAL STATUS 07/17/2008  . LEUKOPENIA, MILD 05/30/2008  . SKIN RASH 07/02/2007  . Hyperglycemia 07/02/2007  . Hyperlipidemia 03/09/2007  . Iron deficiency anemia 03/09/2007  . MITRAL VALVE PROLAPSE 03/09/2007  . Allergic rhinitis 03/09/2007  . COPD (chronic obstructive pulmonary disease) ([place]) 03/09/2007  . History of colonic polyps 03/09/2007  . Asthma 01/18/2007  . Cough 01/18/2007  . Essential hypertension 11/12/2006    Past Surgical History:  Procedure Laterality Date  . ABDOMINAL HYSTERECTOMY    . CARDIAC CATHETERIZATION  1992   S/P in 1992 at [facility] in [place]. This was negative for any coronary artery disease  . carotid duplex  08/29/2003  . CATARACT EXTRACTION    . ceasarean    . ENTEROSCOPY N/A 10/10/2013   Procedure: ENTEROSCOPY;  Surgeon: Inda Castle, MD;  Location: [facility] ENDOSCOPY;  Service: Endoscopy;  Laterality: N/A;  . ENTEROSCOPY N/A 12/28/2014   Procedure: ENTEROSCOPY;  Surgeon: Inda Castle, MD;  Location: [facility] ENDOSCOPY;  Service: Endoscopy;  Laterality: N/A;  . ENTEROSCOPY N/A 01/20/2016   Procedure: ENTEROSCOPY;  Surgeon: Manus Gunning, MD;  Location: [facility] ENDOSCOPY;  Service: Gastroenterology;  Laterality: N/A;  . HOT HEMOSTASIS N/A 12/28/2014   Procedure: HOT HEMOSTASIS (ARGON PLASMA COAGULATION/BICAP);  Surgeon: Inda Castle, MD;  Location: [facility] ENDOS[facility];  Service: Endoscopy;  Laterality: N/A;  . LEFT HEART CATHETERIZATION WITH CORONARY  ANGIOGRAM N/A 04/23/2012   Procedure: LEFT HEART CATHETERIZATION WITH CORONARY ANGIOGRAM;  Surgeon: Larey Dresser, MD;  Location: [facility] CATH LAB;  Service: Cardiovascular;  Laterality: N/A;  . TONSILLECTOMY    . TUBAL LIGATION       OB History   No obstetric history on file.     Family History  Problem Relation Age of Onset  . Prostate cancer Brother   . Stomach cancer Mother   . Sarcoidosis Sister   . Colon cancer Brother   . Sarcoidosis Other   . CAD Neg Hx     Social History   Tobacco Use  . Smoking status: Never Smoker  . Smokeless tobacco: Never Used  Vaping Use  . Vaping Use: Never used  Substance Use Topics  . Alcohol use: No  . Drug use: No    Home Medications Prior to Admission medications   Medication Sig  Start Date End Date Taking? Authorizing Provider  acetaminophen (TYLENOL) 500 MG tablet Take 2 tablets (1,000 mg total) by mouth 2 (two) times daily. 10/05/15   Biagio Borg, MD  atorvastatin (LIPITOR) 20 MG tablet TAKE 1 TABLET BY MOUTH EVERY DAY 06/15/19   Biagio Borg, MD  ferrous sulfate 325 (65 FE) MG tablet TAKE 1 TABLET BY MOUTH 2 TIMES DAILY WITH A MEAL. 11/29/19   Biagio Borg, MD  metoprolol succinate (TOPROL-XL) 50 MG 24 hr tablet Take 1.5 tablets (75 mg total) by mouth daily. Take with or immediately following a meal. 11/30/19   Weaver, Nicki Reaper T, PA-C  metoprolol tartrate (LOPRESSOR) 25 MG tablet Take 1 tablet (25 mg total) by mouth once as needed for up to 1 dose (palpitations). 11/30/19   [name] T, PA-C  Multiple Vitamin (MULTIVITAMIN WITH MINERALS) TABS tablet Take 1 tablet by mouth daily. Centrum Silver    [provider]  nitroGLYCERIN (NITROSTAT) 0.4 MG SL tablet Place 1 tablet (0.4 mg total) under the tongue every 5 (five) minutes as needed for chest pain (maximum of 3 tablets). 12/13/18   Sherren Mocha, MD    Allergies    Doxycycline, Biaxin [clarithromycin], and Metronidazole  Review of Systems   Review of Systems    Respiratory: Negative for cough and shortness of breath.   Cardiovascular: Positive for palpitations. Negative for syncope and near-syncope.  Gastrointestinal: Negative for vomiting.  Musculoskeletal: Negative for back pain.  Neurological: Negative for dizziness.  All other systems reviewed and are negative.   Physical Exam Updated Vital Signs BP (!) 155/138   Pulse 83   Temp 97.8 F (36.6 C) (Oral)   Resp (!) 22   Ht 5\' 4"  (1.626 m)   Wt 51.3 kg   SpO2 100%   BMI 19.40 kg/m   Physical Exam Vitals and nursing note reviewed.  Constitutional:      General: She is not in acute distress.    Appearance: Normal appearance. She is not diaphoretic.  HENT:     Head: Normocephalic and atraumatic.     Nose: Nose normal.  Eyes:     Conjunctiva/sclera: Conjunctivae normal.     Pupils: Pupils are equal, round, and reactive to light.  Cardiovascular:     Rate and Rhythm: Normal rate and regular rhythm.     Pulses: Normal pulses.     Heart sounds: Normal heart sounds.  Pulmonary:     Effort: Tachypnea present.     Breath sounds: Normal breath sounds. No wheezing or rales.  Abdominal:     General: Abdomen is flat. Bowel sounds are normal.     Palpations: Abdomen is soft.     Tenderness: There is no abdominal tenderness. There is no guarding or rebound.  Musculoskeletal:        General: Normal range of motion.     Cervical back: Normal range of motion and neck supple.  Skin:    General: Skin is warm and dry.     Capillary Refill: Capillary refill takes less than 2 seconds.  Neurological:     General: No focal deficit present.     Mental Status: She is alert and oriented to person, place, and time.     Deep Tendon Reflexes: Reflexes normal.  Psychiatric:        Thought Content: Thought content normal.     ED Results / Procedures / Treatments   Labs (all labs ordered are listed, but only abnormal results are displayed)  Results for orders placed or performed during the  hospital encounter of 16/10/96  Basic metabolic panel  Result Value Ref Range   Sodium 128 (L) 135 - 145 mmol/L   Potassium 4.6 3.5 - 5.1 mmol/L   Chloride 94 (L) 98 - 111 mmol/L   CO2 23 22 - 32 mmol/L   Glucose, Bld 175 (H) 70 - 99 mg/dL   BUN 14 8 - 23 mg/dL   Creatinine, Ser 1.12 (H) 0.44 - 1.00 mg/dL   Calcium 9.3 8.9 - 10.3 mg/dL   GFR calc non Af Amer 45 (L) >60 mL/min   GFR calc Af Amer 52 (L) >60 mL/min   Anion gap 11 5 - 15  CBC  Result Value Ref Range   WBC 6.3 4.0 - 10.5 K/uL   RBC 3.66 (L) 3.87 - 5.11 MIL/uL   Hemoglobin 12.5 12.0 - 15.0 g/dL   HCT 38.3 36 - 46 %   MCV 104.6 (H) 80.0 - 100.0 fL   MCH 34.2 (H) 26.0 - 34.0 pg   MCHC 32.6 30.0 - 36.0 g/dL   RDW 12.3 11.5 - 15.5 %   Platelets 230 150 - 400 K/uL   nRBC 0.0 0.0 - 0.2 %  Troponin I (High Sensitivity)  Result Value Ref Range   Troponin I (High Sensitivity) 22 (H) <18 ng/L  Troponin I (High Sensitivity)  Result Value Ref Range   Troponin I (High Sensitivity) 60 (H) <18 ng/L   DG Chest 2 View  Result Date: 12/08/2019 CLINICAL DATA:  86 year old female with palpitations. EXAM: CHEST - 2 VIEW COMPARISON:  Chest radiographs 11/26/2019 and earlier. FINDINGS: Cardiomegaly is increased since 2017. Tortuosity of the thoracic aorta has mildly increased. Other mediastinal contours are within normal limits. Visualized tracheal air column is within normal limits. Lower lung volumes today. Chronic mild elevation of the left hemidiaphragm. No pneumothorax, pulmonary edema, pleural effusion or confluent pulmonary opacity. Negative visible bowel gas pattern. No acute osseous abnormality identified. IMPRESSION: No acute cardiopulmonary abnormality. Cardiomegaly and tortuous aorta. Electronically Signed   By: Genevie Ann M.D.   On: 12/08/2019 18:21   DG Chest Port 1 View  Result Date: 11/26/2019 CLINICAL DATA:  Chest pain. EXAM: PORTABLE CHEST 1 VIEW COMPARISON:  June 01, 2019 FINDINGS: Transcutaneous pacer leads obscure the left  side of the heart mediastinum as well as a large portion left lung. No pneumothorax. The lungs are clear within visualize limits. No other interval changes or acute abnormalities. IMPRESSION: The study is partially limited due to obscuration from transcutaneous pacer leads. No acute abnormalities are noted. Electronically Signed   By: Dorise Bullion III M.D   On: 11/26/2019 14:08    EKG See epic,    Radiology DG Chest 2 View  Result Date: 12/08/2019 CLINICAL DATA:  86 year old female with palpitations. EXAM: CHEST - 2 VIEW COMPARISON:  Chest radiographs 11/26/2019 and earlier. FINDINGS: Cardiomegaly is increased since 2017. Tortuosity of the thoracic aorta has mildly increased. Other mediastinal contours are within normal limits. Visualized tracheal air column is within normal limits. Lower lung volumes today. Chronic mild elevation of the left hemidiaphragm. No pneumothorax, pulmonary edema, pleural effusion or confluent pulmonary opacity. Negative visible bowel gas pattern. No acute osseous abnormality identified. IMPRESSION: No acute cardiopulmonary abnormality. Cardiomegaly and tortuous aorta. Electronically Signed   By: Genevie Ann M.D.   On: 12/08/2019 18:21    Procedures Procedures (including critical care time)  Medications Ordered in ED Medications  aspirin chewable tablet 324 mg (  324 mg Oral Given 12/09/19 0201)    ED Course  I have reviewed the triage vital signs and the nursing notes.  Pertinent labs & imaging results that were available during my care of the patient were reviewed by me and considered in my medical decision making (see chart for details).    Resting comfortably will rule out for PE in the ED.   Final Clinical Impression(s) / ED Diagnoses Final diagnoses:  Heart palpitations  Elevated troponin   Admit based on positive troponin with HEART score of 6.     Zela Sobieski, MD 12/09/19 0230

## 2019-12-09 NOTE — H&P (Signed)
History and Physical    Ariba B. Pinkhasov QPY:[id number] DOB: Apr 15, 1933 DOA: 12/08/2019  PCP: Biagio Borg, MD   Patient coming from: Home   Chief Complaint: Palpitations   HPI: Lindsay Doyle is a 86 y.o. female with medical history significant for paroxysmal atrial fibrillation not anticoagulated due to GI bleed history, paroxysmal SVT, CAD, chronic kidney disease 3A, hypertension, now presenting to the emergency department for evaluation of palpitations.  The patient reports increasingly frequent palpitations, had recently been seen in the ED with SVT, and continues to have occasional palpitations despite increasing her metoprolol succinate.  She was in her usual state of health yesterday afternoon when she experienced another episode of rapid palpitations, took a dose of metoprolol tartrate, did not have any relief with this, and proceeded to the emergency department where palpitations eventually resolved during the long wait to be seen.  She denies any chest pressure, shortness of breath, nausea, or diaphoresis associated with this.  She denies any recent fevers, chills, or cough.  She denies any leg swelling or tenderness.    ED Course: Upon arrival to the ED, patient is found to be afebrile, saturating well on room air, and with stable blood pressure.  EKG features a normal sinus rhythm and chest x-rays negative for acute cardiopulmonary disease.  CTA chest is negative for PE.  Chemistry panel is notable for sodium of 128 and creatinine 1.12.  CBC features a macrocytosis without anemia.  High-sensitivity troponin went from 22 to 60.  COVID-19 screening test not yet collected.  Review of Systems:  All other systems reviewed and apart from HPI, are negative.  Past Medical History:  Diagnosis Date  . ANEMIA-IRON DEFICIENCY    presumed GI bleed 08/2012- anticoag stopped  . Atrial fibrillation ([place])    a. Apixaban started 03/2012 - stopped after admx for GI bleed 6/14  . Blood transfusion     a. 05/2010: 3 transfusions for anemia/GIB.  [name] CAD (coronary artery disease)    a. LHC 1/14:  mLAD serial 30 and 60%, oRCA 30%, EF 60%, Afib,   . Cataract   . COLONIC POLYPS, HX OF 03/09/2007  . COPD 03/09/2007   ? pt is unsure  . GERD (gastroesophageal reflux disease)   . GI bleed    thought to be due to AVMs  . Hx of echocardiogram    a. echo 2/11:  EF 55-60%, mild AI, mild RAE, mild to mod TR;   b.  a. Echo 2/14:  Mild focal basal and mild LVH, EF 60-65%, mild AI, mild RAE, trivia effusion  . HYPERGLYCEMIA 07/02/2007  . Hyperlipidemia   . Hypertension 11/12/2006  . LEUKOPENIA, MILD 05/30/2008  . Mitral valve prolapse    Remotely - last echo 2011 did not show this  . NSTEMI (non-ST elevated myocardial infarction) ([place]) 03/2012   a. Type II in setting of AF with RVR 03/2012  . OSTEOARTHRITIS 02/19/2009   Spinal  . OSTEOPOROSIS 11/12/2006  . Positive H. pylori test 10/1996  . PSVT    a. Remotely, in setting of anemia.  [name] Spinal stenosis of lumbar region 07/16/2012  . Transfusion history    7/15     Past Surgical History:  Procedure Laterality Date  . ABDOMINAL HYSTERECTOMY    . CARDIAC CATHETERIZATION  1992   S/P in [facility] at [facility] in [place]. This was negative for any coronary artery disease  . carotid duplex  08/29/2003  . CATARACT EXTRACTION    .  ceasarean    . ENTEROSCOPY N/A 10/10/2013   Procedure: ENTEROSCOPY;  Surgeon: Inda Castle, MD;  Location: [facility] ENDOSCOPY;  Service: Endoscopy;  Laterality: N/A;  . ENTEROSCOPY N/A 12/28/2014   Procedure: ENTEROSCOPY;  Surgeon: Inda Castle, MD;  Location: [facility] ENDOSCOPY;  Service: Endoscopy;  Laterality: N/A;  . ENTEROSCOPY N/A 01/20/2016   Procedure: ENTEROSCOPY;  Surgeon: Manus Gunning, MD;  Location: [facility] ENDOSCOPY;  Service: Gastroenterology;  Laterality: N/A;  . HOT HEMOSTASIS N/A 12/28/2014   Procedure: HOT HEMOSTASIS (ARGON PLASMA COAGULATION/BICAP);  Surgeon: Inda Castle, MD;  Location: [facility];  Service: Endoscopy;  Laterality: N/A;  . LEFT HEART CATHETERIZATION WITH CORONARY ANGIOGRAM N/A 04/23/2012   Procedure: LEFT HEART CATHETERIZATION WITH CORONARY ANGIOGRAM;  Surgeon: Larey Dresser, MD;  Location: [facility] CATH LAB;  Service: Cardiovascular;  Laterality: N/A;  . TONSILLECTOMY    . TUBAL LIGATION      Social History:   reports that she has never smoked. She has never used smokeless tobacco. She reports that she does not drink alcohol and does not use drugs.  Allergies  Allergen Reactions  . Doxycycline Diarrhea    Possible diarrhea  . Biaxin [Clarithromycin] Other (See Comments)    diarrhea  . Metronidazole Other (See Comments)    Pt had difficulty swallowing this and says it was "horrible" to take. No allergic reaction.    Family History  Problem Relation Age of Onset  . Prostate cancer Brother   . Stomach cancer Mother   . Sarcoidosis Sister   . Colon cancer Brother   . Sarcoidosis Other   . CAD Neg Hx      Prior to Admission medications   Medication Sig Start Date End Date Taking? Authorizing Provider  acetaminophen (TYLENOL) 500 MG tablet Take 2 tablets (1,000 mg total) by mouth 2 (two) times daily. 10/05/15   Biagio Borg, MD  atorvastatin (LIPITOR) 20 MG tablet TAKE 1 TABLET BY MOUTH EVERY DAY 06/15/19   Biagio Borg, MD  ferrous sulfate 325 (65 FE) MG tablet TAKE 1 TABLET BY MOUTH 2 TIMES DAILY WITH A MEAL. 11/29/19   Biagio Borg, MD  metoprolol succinate (TOPROL-XL) 50 MG 24 hr tablet Take 1.5 tablets (75 mg total) by mouth daily. Take with or immediately following a meal. 11/30/19   Weaver, Nicki Reaper T, PA-C  metoprolol tartrate (LOPRESSOR) 25 MG tablet Take 1 tablet (25 mg total) by mouth once as needed for up to 1 dose (palpitations). 11/30/19   [name] T, PA-C  Multiple Vitamin (MULTIVITAMIN WITH MINERALS) TABS tablet Take 1 tablet by mouth daily. Centrum Silver    [provider]  nitroGLYCERIN (NITROSTAT) 0.4 MG SL tablet Place 1 tablet  (0.4 mg total) under the tongue every 5 (five) minutes as needed for chest pain (maximum of 3 tablets). 12/13/18   Sherren Mocha, MD    Physical Exam: Vitals:   12/09/19 0149 12/09/19 0154 12/09/19 0215 12/09/19 0254  BP: (!) 155/138     Pulse: 85 83 93   Resp: (!) 23 (!) 22 (!) 31 20  Temp:      TempSrc:      SpO2: 99% 100% 99%   Weight:      Height:        Constitutional: NAD, calm  Eyes: PERTLA, lids and conjunctivae normal ENMT: Mucous membranes are moist. Posterior pharynx clear of any exudate or lesions.   Neck: normal, supple, no masses, no thyromegaly Respiratory:  no wheezing,  no crackles. No accessory muscle use.  Cardiovascular: S1 & S2 heard, regular rate and rhythm. No extremity edema.  Abdomen: No distension, no tenderness, soft. Bowel sounds active.  Musculoskeletal: no clubbing / cyanosis. No joint deformity upper and lower extremities.   Skin: no significant rashes, lesions, ulcers. Warm, dry, well-perfused. Neurologic: CN 2-12 grossly intact. Sensation intact. Strength 5/5 in all 4 limbs.  Psychiatric: Alert and oriented to person, place, and situation. Very pleasant and cooperative.    Labs and Imaging on Admission: I have personally reviewed following labs and imaging studies  CBC: Recent Labs  Lab 12/08/19 1732  WBC 6.3  HGB 12.5  HCT 38.3  MCV 104.6*  PLT 284   Basic Metabolic Panel: Recent Labs  Lab 12/08/19 1732  NA 128*  K 4.6  CL 94*  CO2 23  GLUCOSE 175*  BUN 14  CREATININE 1.12*  CALCIUM 9.3   GFR: Estimated Creatinine Clearance: 30.3 mL/min (A) (by C-G formula based on SCr of 1.12 mg/dL (H)). Liver Function Tests: No results for input(s): AST, ALT, ALKPHOS, BILITOT, PROT, ALBUMIN in the last 168 hours. No results for input(s): LIPASE, AMYLASE in the last 168 hours. No results for input(s): AMMONIA in the last 168 hours. Coagulation Profile: No results for input(s): INR, PROTIME in the last 168 hours. Cardiac Enzymes: No  results for input(s): CKTOTAL, CKMB, CKMBINDEX, TROPONINI in the last 168 hours. BNP (last 3 results) No results for input(s): PROBNP in the last 8760 hours. HbA1C: No results for input(s): HGBA1C in the last 72 hours. CBG: No results for input(s): GLUCAP in the last 168 hours. Lipid Profile: No results for input(s): CHOL, HDL, LDLCALC, TRIG, CHOLHDL, LDLDIRECT in the last 72 hours. Thyroid Function Tests: No results for input(s): TSH, T4TOTAL, FREET4, T3FREE, THYROIDAB in the last 72 hours. Anemia Panel: No results for input(s): VITAMINB12, FOLATE, FERRITIN, TIBC, IRON, RETICCTPCT in the last 72 hours. Urine analysis:    Component Value Date/Time   COLORURINE STRAW (A) 11/26/2019 1301   APPEARANCEUR CLEAR 11/26/2019 1301   LABSPEC 1.004 (L) 11/26/2019 1301   LABSPEC 1.010 04/03/2011 1135   PHURINE 7.0 11/26/2019 1301   GLUCOSEU 150 (A) 11/26/2019 1301   GLUCOSEU NEGATIVE 09/16/2019 1500   HGBUR NEGATIVE 11/26/2019 1301   BILIRUBINUR NEGATIVE 11/26/2019 1301   BILIRUBINUR Neg 05/08/2014 1052   BILIRUBINUR Negative 04/03/2011 1135   KETONESUR 5 (A) 11/26/2019 1301   PROTEINUR NEGATIVE 11/26/2019 1301   UROBILINOGEN 0.2 09/16/2019 1500   NITRITE NEGATIVE 11/26/2019 1301   LEUKOCYTESUR NEGATIVE 11/26/2019 1301   LEUKOCYTESUR Moderate 04/03/2011 1135   Sepsis Labs: @LABRCNTIP (procalcitonin:4,lacticidven:4) )No results found for this or any previous visit (from the past 240 hour(s)).   Radiological Exams on Admission: DG Chest 2 View  Result Date: 12/08/2019 CLINICAL DATA:  86 year old female with palpitations. EXAM: CHEST - 2 VIEW COMPARISON:  Chest radiographs 11/26/2019 and earlier. FINDINGS: Cardiomegaly is increased since 2017. Tortuosity of the thoracic aorta has mildly increased. Other mediastinal contours are within normal limits. Visualized tracheal air column is within normal limits. Lower lung volumes today. Chronic mild elevation of the left hemidiaphragm. No  pneumothorax, pulmonary edema, pleural effusion or confluent pulmonary opacity. Negative visible bowel gas pattern. No acute osseous abnormality identified. IMPRESSION: No acute cardiopulmonary abnormality. Cardiomegaly and tortuous aorta. Electronically Signed   By: Genevie Ann M.D.   On: 12/08/2019 18:21   CT Angio Chest PE W and/or Wo Contrast  Result Date: 12/09/2019 CLINICAL DATA:  Chest pain, shortness of  breath EXAM: CT ANGIOGRAPHY CHEST WITH CONTRAST TECHNIQUE: Multidetector CT imaging of the chest was performed using the standard protocol during bolus administration of intravenous contrast. Multiplanar CT image reconstructions and MIPs were obtained to evaluate the vascular anatomy. CONTRAST:  155mL OMNIPAQUE IOHEXOL 350 MG/ML SOLN COMPARISON:  Chest x-ray 12/08/2019 FINDINGS: Cardiovascular: No filling defects in the pulmonary arteries to suggest pulmonary emboli. Cardiomegaly. Distal aortic arch mildly dilated at 3.9 cm. Aortic atherosclerosis and coronary artery disease. Mediastinum/Nodes: No mediastinal, hilar, or axillary adenopathy. Trachea and esophagus are unremarkable. Thyroid unremarkable. Lungs/Pleura: No confluent opacities or effusions. Upper Abdomen: Imaging into the upper abdomen demonstrates no acute findings. Musculoskeletal: Chest wall soft tissues are unremarkable. No acute bony abnormality. Review of the MIP images confirms the above findings. IMPRESSION: No evidence of pulmonary embolus. Mildly aneurysmal distal aortic arch, 3.9 cm. Cardiomegaly.  Coronary artery disease. No acute cardiopulmonary disease. Aortic Atherosclerosis (ICD10-I70.0). Electronically Signed   By: Rolm Baptise M.D.   On: 12/09/2019 03:02    EKG: Independently reviewed. Sinus rhythm.   Assessment/Plan   1. Elevated troponin  - Patient presents for palpitations that did not immediately improve with prn metoprolol at home but then resolved while waiting in ED  - HS troponin went from 22 to 60 - She denies  chest pain, EKG with NSR and no acute ischemic features, and CXR negative for acute findings  - TSH was normal 2 weeks ago  - This is likely secondary to transient tachyarrhythmia rather than ACS  - She was given ASA 324 mg in ED  - Continue cardiac monitoring, check another troponin, continue metoprolol and Lipitor    2. Paroxysmal atrial fibrillation; paroxysmal SVT    - Patient presents with palpitations that persisted after a prn dose of metoprolol at home but eventually resolved while waiting in ED  - She reports increasingly frequent palpitations recently, was treated in ED recently for SVT, had Toprol increased and is taking Lopressor as needed - She is in NSR in ED now  - Not anticoagulated despite CHADS-VASc of 85 (age x2, gender, HTN, HFpEF) due to hx of GIB  - Continue Toprol and prn Lopressor; BP has been high in ED and may be able to titrate up    3. Hyponatremia  - Serum sodium 128 in ED  - Patient appears euvolemic - TSH was normal 2 weeks ago, check urine osm and urine sodium, repeat chem panel in am    4. CKD IIIa  - SCr is 1.12 on admission, similar to priors  - Renally-dose medications, monitor     DVT prophylaxis: Lovenox  Code Status: Full  Family Communication: Discussed with patient  Disposition Plan:  Patient is from: home  Anticipated d/c is to: Home  Anticipated d/c date is: 12/09/19 Patient currently: pending repeat troponin, BP-control, cardiac monitoring Consults called: none  Admission status: Observation     Vianne Bulls, MD [facility]  12/09/2019, 3:24 AM

## 2019-12-09 NOTE — Progress Notes (Addendum)
PROGRESS NOTE    Lindsay Doyle  YCX:[id number] DOB: [birth year] DOA: 12/08/2019 PCP: Biagio Borg, MD    Brief Narrative:  Patient admitted to the hospital with the working diagnosis of paroxysmal atrial fibrillation/ SVT.   86 yo female with PMHX of paroxysmal atrial fibrillation, SVT, CAD, HTN, chronic kidney disease 3a who presents with palpitations. Reported worsening episodic palpitations, have not improved with oral metoprolol. No chest pain or dyspnea. On her initial physical examination her blood pressure was 155/138, HR 85, RR23, oxygen saturation 99%, Her lungs were clear to auscultation, heart S1-S2 present and rhythmic, abdomen soft and no lower extremity edema.  Sodium 128, potassium 4.6, chloride 94, bicarb 23, glucose 175, BUN 14, creatinine 1.12, troponin I 22, white count 6.3, hemoglobin 12.5, hematocrit 38.3, platelets 230.  SARS COVID-19 negative.  Chest radiograph no infiltrates.  Chest CT negative for pulmonary embolism, aneurysmal sac aortic arch 3.9 cm. EKG 89 bpm, normal axis, normal intervals, sinus rhythm, no ST segment or T wave changes.   Assessment & Plan:   Principal Problem:   AF (paroxysmal atrial fibrillation) (HCC) Active Problems:   Essential hypertension   PSVT   CKD (chronic kidney disease), stage III   Coronary Artery Disease   Hyponatremia   Atrial fibrillation (H[facility])   Elevated troponin   1. Paroxysmal atrial fibrillation/ SVT. Patient has remained on sinus rhythm, no palpitations or chest pain, no nausea or vomiting. Off anticoagulation due to prior GI bleed.  Metoprolol succinate has been increased to 75 mg about one week ago (09/01). Last echocardiogram from 2017 patient had a preserved LV systolic function.  She had recent cardioversion in the ED for SVT, (08/28) after failed rate control with adenosine.  TSH on 08/21 normal 2,17  Will get a new echocardiogram and continue close telemetry monitoring. Patient may need further cardiac  monitoring as outpatient to document true arrhythmia, alternative option will be to use antiarrhythmic therapy. .  Will consult cardiology for recommendations.   Elevation in troponin with no ACS pattern, likely related to tachycardia and demand ischemia.   Out of bed to chair, PT and OT evaluation.   2. CKD stage 3a with Hyponatremia. Na is up to 136, with preserved renal function cr at 0,86 and bicarbonate at 24, glucose 115. Patient is tolerating po well, will hold on IV fluids and will continue Na monitoring.   3, HTN/ dyslipidemia. Continue blood pressure control with metoprolol  Continue statin therapy with atorvastatin  4. Hx of GI bleed. Continue to hold on anticoagulation.    Patient continue to be at high risk for recurrent arrhythmia  Status is: Observation  The patient will require care spanning > 2 midnights and should be moved to inpatient because: Inpatient level of care appropriate due to severity of illness patient needs continue telemetry monitoring due to risk of recurrent arrhythmia.   Dispo: The patient is from: Home              Anticipated d/c is to: Home              Anticipated d/c date is: 1 day              Patient currently is not medically stable to d/c.   DVT prophylaxis: Enoxaparin   Code Status:   full  Family Communication:  No family at the bedside      Subjective: Patient is feeling better, no nausea or vomiting, no further palpitations .  Objective: Vitals:  12/09/19 1200 12/09/19 1400 12/09/19 1415 12/09/19 1452  BP: 127/79 (!) 103/91 114/85 (!) 132/99  Pulse: 70 81 78 87  Resp: 19 19 20 19   Temp:    97.8 F (36.6 C)  TempSrc:    Oral  SpO2: 100% 100% 99% 100%  Weight:      Height:       No intake or output data in the 24 hours ending 12/09/19 1637 Filed Weights   12/08/19 1704  Weight: 51.3 kg    Examination:   General: Not in pain or dyspnea.  Neurology: Awake and alert, non focal  E ENT: no pallor, no icterus, oral  mucosa moist Cardiovascular: No JVD. S1-S2 present, rhythmic, no gallops, rubs, or murmurs. No lower extremity edema. Pulmonary: positive breath sounds bilaterally, adequate air movement, no wheezing, rhonchi or rales. Gastrointestinal. Abdomen soft and non tender Skin. No rashes Musculoskeletal: no joint deformities     Data Reviewed: I have personally reviewed following labs and imaging studies  CBC: Recent Labs  Lab 12/08/19 1732 12/09/19 0342  WBC 6.3 6.0  HGB 12.5 13.5  HCT 38.3 40.1  MCV 104.6* 100.0  PLT 230 408   Basic Metabolic Panel: Recent Labs  Lab 12/08/19 1732 12/09/19 0342  NA 128* 136  K 4.6 3.9  CL 94* 98  CO2 23 24  GLUCOSE 175* 115*  BUN 14 11  CREATININE 1.12* 0.86  CALCIUM 9.3 10.0   GFR: Estimated Creatinine Clearance: 39.4 mL/min (by C-G formula based on SCr of 0.86 mg/dL). Liver Function Tests: No results for input(s): AST, ALT, ALKPHOS, BILITOT, PROT, ALBUMIN in the last 168 hours. No results for input(s): LIPASE, AMYLASE in the last 168 hours. No results for input(s): AMMONIA in the last 168 hours. Coagulation Profile: No results for input(s): INR, PROTIME in the last 168 hours. Cardiac Enzymes: No results for input(s): CKTOTAL, CKMB, CKMBINDEX, TROPONINI in the last 168 hours. BNP (last 3 results) No results for input(s): PROBNP in the last 8760 hours. HbA1C: No results for input(s): HGBA1C in the last 72 hours. CBG: No results for input(s): GLUCAP in the last 168 hours. Lipid Profile: No results for input(s): CHOL, HDL, LDLCALC, TRIG, CHOLHDL, LDLDIRECT in the last 72 hours. Thyroid Function Tests: No results for input(s): TSH, T4TOTAL, FREET4, T3FREE, THYROIDAB in the last 72 hours. Anemia Panel: No results for input(s): VITAMINB12, FOLATE, FERRITIN, TIBC, IRON, RETICCTPCT in the last 72 hours.    Radiology Studies: I have reviewed all of the imaging during this hospital visit personally     Scheduled Meds: .  atorvastatin  20 mg Oral Daily  . enoxaparin (LOVENOX) injection  40 mg Subcutaneous Daily  . metoprolol succinate  75 mg Oral Daily  . sodium chloride flush  3 mL Intravenous Q12H  . sodium chloride flush  3 mL Intravenous Q12H   Continuous Infusions: . sodium chloride       LOS: 0 days        Anan Dapolito Gerome Apley, MD

## 2019-12-09 NOTE — Telephone Encounter (Signed)
Follow up   Pts husband wants to speak with Raquel Sarna before making appointment   Call trasnferred to [facility]

## 2019-12-10 ENCOUNTER — Inpatient Hospital Stay (HOSPITAL_COMMUNITY): Payer: Medicare Other

## 2019-12-10 ENCOUNTER — Telehealth: Payer: Self-pay | Admitting: Cardiology

## 2019-12-10 DIAGNOSIS — I350 Nonrheumatic aortic (valve) stenosis: Secondary | ICD-10-CM

## 2019-12-10 DIAGNOSIS — I361 Nonrheumatic tricuspid (valve) insufficiency: Secondary | ICD-10-CM

## 2019-12-10 DIAGNOSIS — I10 Essential (primary) hypertension: Secondary | ICD-10-CM

## 2019-12-10 DIAGNOSIS — I351 Nonrheumatic aortic (valve) insufficiency: Secondary | ICD-10-CM

## 2019-12-10 DIAGNOSIS — R778 Other specified abnormalities of plasma proteins: Secondary | ICD-10-CM

## 2019-12-10 LAB — BASIC METABOLIC PANEL
Anion gap: 11 (ref 5–15)
BUN: 16 mg/dL (ref 8–23)
CO2: 26 mmol/L (ref 22–32)
Calcium: 9.6 mg/dL (ref 8.9–10.3)
Chloride: 102 mmol/L (ref 98–111)
Creatinine, Ser: 1.28 mg/dL — ABNORMAL HIGH (ref 0.44–1.00)
GFR calc Af Amer: 44 mL/min — ABNORMAL LOW (ref >60)
GFR calc non Af Amer: 38 mL/min — ABNORMAL LOW (ref >60)
Glucose, Bld: 97 mg/dL (ref 70–99)
Potassium: 4.2 mmol/L (ref 3.5–5.1)
Sodium: 139 mmol/L (ref 135–145)

## 2019-12-10 LAB — ECHOCARDIOGRAM COMPLETE
AR max vel: 0.48 cm2
AV Area VTI: 1.7 cm2
AV Mean grad: 28 mmHg
AV Peak grad: 39.7 mmHg
Ao pk vel: 3.15 m/s
Area-P 1/2: 2.32 cm2
Height: 64 in
P 1/2 time: 511 msec
S' Lateral: 1.7 cm
Weight: 1809.54 oz

## 2019-12-10 LAB — GLUCOSE, CAPILLARY: Glucose-Capillary: 95 mg/dL (ref 70–99)

## 2019-12-10 MED ORDER — ATORVASTATIN CALCIUM 10 MG PO TABS: 20.0000 mg | ORAL_TABLET | Freq: Every day | ORAL | Status: DC

## 2019-12-10 MED ORDER — ENOXAPARIN SODIUM 30 MG/0.3ML ~~LOC~~ SOLN: 30.0000 mg | SUBCUTANEOUS | Status: DC

## 2019-12-10 MED ORDER — PERFLUTREN LIPID MICROSPHERE
1.0000 mL | INTRAVENOUS | Status: DC | PRN
  Filled 2019-12-10: qty 10

## 2019-12-10 NOTE — Evaluation (Signed)
Occupational Therapy Evaluation Patient Details Name: Lindsay Doyle MRN: [id number] DOB: 10-27-1933 Today's Date: 12/10/2019    History of Present Illness The pt is an 86 yo female presenting for palpitations. The pt is undergoing continued workup, but was reccently admitted for paroximal afib with SVT, and presented with similar sx. PMH includes: paroxysmal afib not anticoagulated due to GI bleed history, paroxysmal SVT, CAD, CHD IIIa, and HTN.   Clinical Impression   This 86 y/o female presents with the above. PTA pt very independent with ADL, iADL and functional mobility, reports she exercises 5 days/week. Pt very pleasant and willing to participate in therapy session; upset start of session given current hospitalization and considering she works very hard to remain active and healthy. Overall pt performing room level mobility without AD and ADL tasks grossly at supervision level today. Pt tolerating standing at sink >5 min during completion of ADL tasks, tolerating well with HR up to 105 bpm with activity. Suspect pt is likely at or close to her baseline function with ADL/mobility tasks. Pt reports she lives at home with spouse who shares iADL responsibilities. Education provided and questions answered throughout session with no further acute OT or post acute OT needs identified at this time. Pt anticipating likely d/c home today. Acute OT to sign off. Thank you for this referral. Please re-consult should pt's needs change.    Follow Up Recommendations  No OT follow up;Supervision - Intermittent    Equipment Recommendations  None recommended by OT           Precautions / Restrictions Precautions Precautions: Fall Precaution Comments: low fall Restrictions Weight Bearing Restrictions: No      Mobility Bed Mobility Overal bed mobility: Modified Independent             General bed mobility comments: OOB in recliner   Transfers Overall transfer level: Modified  independent Equipment used: None             General transfer comment: sit<>stand from recliner    Balance Overall balance assessment: Mild deficits observed, not formally tested                               Standardized Balance Assessment Standardized Balance Assessment : Dynamic Gait Index   Dynamic Gait Index Level Surface: Normal Change in Gait Speed: Mild Impairment Gait with Horizontal Head Turns: Mild Impairment Gait with Vertical Head Turns: Mild Impairment Gait and Pivot Turn: Normal Step Over Obstacle: Moderate Impairment Step Around Obstacles: Mild Impairment Steps: Mild Impairment Total Score: 17     ADL either performed or assessed with clinical judgement   ADL Overall ADL's : At baseline                                       General ADL Comments: pt performing multiple standing ADL tasks at sink including standing grooming, LB, and UB ADL; performing room level mobility without AD; all tasks performed at supervision - mod independent level. suspect pt likely close to or at her baseline.                         Pertinent Vitals/Pain Pain Assessment: No/denies pain     Hand Dominance Right   Extremity/Trunk Assessment Upper Extremity Assessment Upper Extremity Assessment: Overall WFL for tasks assessed  Lower Extremity Assessment Lower Extremity Assessment: Overall WFL for tasks assessed;Defer to PT evaluation   Cervical / Trunk Assessment Cervical / Trunk Assessment: Normal   Communication Communication Communication: No difficulties   Cognition Arousal/Alertness: Awake/alert Behavior During Therapy: WFL for tasks assessed/performed Overall Cognitive Status: Within Functional Limits for tasks assessed                                 General Comments: tearful start of session "I try so hard to do everything right, I don't know what I did wrong" (to end up in hospital)   General Comments   VSS    Exercises     Shoulder Instructions      Home Living Family/patient expects to be discharged to:: Private residence Living Arrangements: Spouse/significant other Available Help at Discharge: Family;Available 24 hours/day Type of Home: House Home Access: Stairs to enter [employer] of Steps: 1 Entrance Stairs-Rails: None Home Layout: One level     Bathroom Shower/Tub: Teacher, early years/pre: Standard     Home Equipment: Hand held shower head;Grab bars - tub/shower          Prior Functioning/Environment Level of Independence: Independent        Comments: pt mobilizing without AD, doing 30 min guided workouts 5 days/week (chair yoga, cardio sculpt) she completes all ADLs independently, shares IADLs with spouse. both still driving        OT Problem List: Cardiopulmonary status limiting activity;Decreased knowledge of use of DME or AE      OT Treatment/Interventions:      OT Goals(Current goals can be found in the care plan section) Acute Rehab OT Goals Patient Stated Goal: return home, stay healthy OT Goal Formulation: With patient Time For Goal Achievement: 12/24/19 Potential to Achieve Goals: Good  OT Frequency:     Barriers to D/C:            Co-evaluation              AM-PAC OT "6 Clicks" Daily Activity     Outcome Measure Help from another person eating meals?: None Help from another person taking care of personal grooming?: None Help from another person toileting, which includes using toliet, bedpan, or urinal?: None Help from another person bathing (including washing, rinsing, drying)?: A Little Help from another person to put on and taking off regular upper body clothing?: None Help from another person to put on and taking off regular lower body clothing?: A Little 6 Click Score: 22   End of Session Nurse Communication: Mobility status  Activity Tolerance: Patient tolerated treatment well Patient left: in  chair;with call bell/phone within reach  OT Visit Diagnosis: Other abnormalities of gait and mobility (R26.89)                Time: 6803-2122 OT Time Calculation (min): 30 min Charges:  OT General Charges $OT Visit: 1 Visit OT Evaluation $OT Eval Moderate Complexity: 1 Mod OT Treatments $Self Care/Home Management : 8-22 mins  Lou Cal, OT Acute Rehabilitation Services Pager [phone] Office [phone]   Lindsay Doyle 12/10/2019, 3:17 PM

## 2019-12-10 NOTE — Telephone Encounter (Signed)
Patient seen in ER today with palpitations but no arrhythmias on tele.  She was supposed to see Dr. Quentin Ore and refused to schedule appt.  Now agrees to proceed with EP eval.  Please setup OV with EP ASAP this week

## 2019-12-10 NOTE — Evaluation (Signed)
Physical Therapy Evaluation Patient Details Name: Lindsay Doyle MRN: [id number] DOB: [birth year] Today's Date: 12/10/2019   History of Present Illness  The pt is an 86 yo female presenting for palpitations. The pt is undergoing continued workup, but was reccently admitted for paroximal afib with SVT, and presented with similar sx. PMH includes: paroxysmal afib not anticoagulated due to GI bleed history, paroxysmal SVT, CAD, CHD IIIa, and HTN.  Clinical Impression  Pt in bed upon arrival of PT, agreeable to evaluation at this time. Prior to admission the pt was mobilizing independently at home where she lives with her husband. The pt now presents with minor limitations in functional mobility, endurance, and dynamic stability due to above dx, but is safe to return home with support from her spouse when medically ready to d/c. The pt is able to complete bed mobility and transfers without assist or use of AD, and was able to complete >228ft of hallway ambulation and stairs with supervision only. The pt will continue to benefit from general mobility to maintain her strength, but states she feels she is at her baseline, and able to return home with her husband for support. The pt has no further acute PT needs at this time. Thank you for the consult.      Follow Up Recommendations No PT follow up;Supervision for mobility/OOB    Equipment Recommendations  None recommended by PT    Recommendations for Other Services       Precautions / Restrictions Precautions Precautions: Fall Precaution Comments: low fall Restrictions Weight Bearing Restrictions: No      Mobility  Bed Mobility Overal bed mobility: Modified Independent                Transfers Overall transfer level: Modified independent Equipment used: None             General transfer comment: pt standing from bed and toilet without AD, no assist  Ambulation/Gait Ambulation/Gait assistance: Supervision Gait Distance  (Feet): 200 Feet Assistive device: None (intermittent use of rail in hall) Gait Pattern/deviations: Step-through pattern;Drifts right/left Gait velocity: 0.7 m/s (can increase speed when cued) Gait velocity interpretation: 1.31 - 2.62 ft/sec, indicative of limited community ambulator General Gait Details: Pt with slight lateral drifting and intermittent use of rail in hallway. no LOB, improved stability with continnued gait.  Stairs Stairs: Yes Stairs assistance: Supervision Stair Management: One rail Right;Alternating pattern;Forwards Number of Stairs: 2 (x2) General stair comments: alternating pattern with use of 1 rail     Balance Overall balance assessment: Mild deficits observed, not formally tested                               Standardized Balance Assessment Standardized Balance Assessment : Dynamic Gait Index   Dynamic Gait Index Level Surface: Normal Change in Gait Speed: Mild Impairment Gait with Horizontal Head Turns: Mild Impairment Gait with Vertical Head Turns: Mild Impairment Gait and Pivot Turn: Normal Step Over Obstacle: Moderate Impairment Step Around Obstacles: Mild Impairment Steps: Mild Impairment Total Score: 17       Pertinent Vitals/Pain Pain Assessment: No/denies pain    Home Living Family/patient expects to be discharged to:: Private residence Living Arrangements: Spouse/significant other Available Help at Discharge: Family;Available 24 hours/day Type of Home: House Home Access: Stairs to enter Entrance Stairs-Rails: None Entrance Stairs-Number of Steps: 1 Home Layout: One level Home Equipment: Hand held shower head;Grab bars - tub/shower  Prior Function Level of Independence: Independent         Comments: pt mobilizing without AD, doing 30 min guided workouts 5 days/week (chair yoga, cardio sculpt) she completes all ADLs independently, shares IADLs with spouse. both still driving     Hand Dominance   Dominant  Hand: Right    Extremity/Trunk Assessment   Upper Extremity Assessment Upper Extremity Assessment: Overall WFL for tasks assessed    Lower Extremity Assessment Lower Extremity Assessment: Overall WFL for tasks assessed    Cervical / Trunk Assessment Cervical / Trunk Assessment: Normal  Communication   Communication: No difficulties  Cognition Arousal/Alertness: Awake/alert Behavior During Therapy: WFL for tasks assessed/performed Overall Cognitive Status: Within Functional Limits for tasks assessed                                               Assessment/Plan    PT Assessment Patent does not need any further PT services  PT Problem List         PT Treatment Interventions      PT Goals (Current goals can be found in the Care Plan section)  Acute Rehab PT Goals Patient Stated Goal: return home PT Goal Formulation: With patient Time For Goal Achievement: 12/24/19 Potential to Achieve Goals: Good     AM-PAC PT "6 Clicks" Mobility  Outcome Measure Help needed turning from your back to your side while in a flat bed without using bedrails?: None Help needed moving from lying on your back to sitting on the side of a flat bed without using bedrails?: None Help needed moving to and from a bed to a chair (including a wheelchair)?: None Help needed standing up from a chair using your arms (e.g., wheelchair or bedside chair)?: None Help needed to walk in hospital room?: A Little Help needed climbing 3-5 steps with a railing? : A Little 6 Click Score: 22    End of Session Equipment Utilized During Treatment: Gait belt Activity Tolerance: Patient tolerated treatment well Patient left: in chair;with call bell/phone within reach Nurse Communication: Mobility status PT Visit Diagnosis: Difficulty in walking, not elsewhere classified (R26.2)    Time: 7342-8768 PT Time Calculation (min) (ACUTE ONLY): 15 min   Charges:   PT Evaluation $PT Eval Low  Complexity: 1 Low          Karma Ganja, PT, DPT   Acute Rehabilitation Department Pager #: [phone]  Otho Bellows 12/10/2019, 11:24 AM

## 2019-12-10 NOTE — Progress Notes (Signed)
  Echocardiogram 2D Echocardiogram has been performed.  Lindsay Doyle M 12/10/2019, 3:42 PM

## 2019-12-10 NOTE — Discharge Summary (Signed)
Physician Discharge Summary  Khalessi B. Wollman HAL:[id number] DOB: June 16, 1933 DOA: 12/08/2019  PCP: Biagio Borg, MD  Admit date: 12/08/2019 Discharge date: 12/10/2019  Admitted From: Home  Disposition:  Home   Recommendations for Outpatient Follow-up and new medication changes:  1. Follow up with Dr. Jenny Reichmann in 7 days. 2. Patient has remained on sinus rhythm during her hospitalization 3. She has agreed to follow up with EP as outpatient, she may need a outpatient cardiac monitoring.   Home Health: no   Equipment/Devices: no    Discharge Condition: stable  CODE STATUS: full  Diet recommendation: heart healthy   Brief/Interim Summary: Patient admitted to the hospital with the working diagnosis of palpitations suspected to be related to paroxysmal atrial fibrillation/ SVT.   86 yo female with PMHX of paroxysmal atrial fibrillation, SVT, CAD, HTN, chronic kidney disease 3a who presents with palpitations. Reported worsening episodic palpitations, which did not improved with PRN oral metoprolol. No chest pain or dyspnea. On her initial physical examination her blood pressure was 155/138, HR 85, RR23, oxygen saturation 99%, Her lungs were clear to auscultation, heart S1-S2 present and rhythmic, abdomen soft and no lower extremity edema.  Sodium 128, potassium 4.6, chloride 94, bicarb 23, glucose 175, BUN 14, creatinine 1.12, troponin I 22, white count 6.3, hemoglobin 12.5, hematocrit 38.3, platelets 230.  SARS COVID-19 negative.  Chest radiograph no infiltrates.  Chest CT negative for pulmonary embolism, aneurysmal sac aortic arch 3.9 cm. EKG 89 bpm, normal axis, normal intervals, sinus rhythm, no ST segment or T wave changes.  Off anticoagulation due to prior GI bleed.  Metoprolol succinate has been increased to 75 mg about one week ago (09/01). Last echocardiogram from 2017 patient had a preserved LV systolic function.  She had recent cardioversion in the ED for SVT, (08/28) after failed rate  control with adenosine.  TSH on 08/21 normal 2,17  Patient remained on sinus rhythm during her hospital stay, she now agrees with outpatient follow up.  Patient will be discharge home after echocardiogram.   1.  Paroxysmal atrial fibrillation, SVT.  Patient had no further symptoms during her hospitalization, she will continue on metoprolol succinate 75 mg daily with good toleration.  Patient has been evaluated by physical therapy with no follow-up recommended. Her troponin elevation is not consistent with acute coronary syndrome, likely demand ischemia due to elevated heart rate as an outpatient.  There is no electrocardiographic tracings available showing arrhythmia.  Apparently SVT was diagnosed on the field by EMS late August, and required electrical cardioversion.   Patient will follow-up with outpatient electrophysiology, considering her persistent episodic palpitations she might need a outpatient cardiac monitoring to further clarify if her symptoms are related to A. fib or SVT. Continue PRN metoprolol tartrate.   Patient is not on anticoagulation due to history of gastrointestinal bleed.  2.  Hyponatremia, chronic kidney disease stage IIIa.  Patient received intravenous fluids with improvement of serum sodium.  She is tolerating p.o. diet adequately, at discharge her sodium is 139, potassium 4.2, chloride 102, bicarb 26, glucose 97, BUN 16, creatinine 1.28.  3.  Hypertension/dyslipidemia.  Continue blood pressure control with metoprolol, continue atorvastatin.  4.  History of gastrointestinal bleed.  Currently anticoagulation on hold.   Discharge Diagnoses:  Principal Problem:   AF (paroxysmal atrial fibrillation) (HCC) Active Problems:   Essential hypertension   PSVT   CKD (chronic kidney disease), stage III   Coronary Artery Disease   Hyponatremia   Atrial fibrillation ([place])  Elevated troponin    Discharge Instructions  Discharge Instructions    Diet - low sodium  heart healthy   Complete by: As directed    Increase activity slowly   Complete by: As directed      Allergies as of 12/10/2019      Reactions   Doxycycline Diarrhea   Possible diarrhea   Biaxin [clarithromycin] Other (See Comments)   diarrhea   Metronidazole Other (See Comments)   Pt had difficulty swallowing this and says it was "horrible" to take. No allergic reaction.      Medication List    TAKE these medications   acetaminophen 500 MG tablet Commonly known as: TYLENOL Take 2 tablets (1,000 mg total) by mouth 2 (two) times daily. What changed: additional instructions   atorvastatin 20 MG tablet Commonly known as: LIPITOR TAKE 1 TABLET BY MOUTH EVERY DAY   ferrous sulfate 325 (65 FE) MG tablet TAKE 1 TABLET BY MOUTH 2 TIMES DAILY WITH A MEAL. What changed:   how much to take  how to take this  when to take this   metoprolol succinate 50 MG 24 hr tablet Commonly known as: TOPROL-XL Take 1.5 tablets (75 mg total) by mouth daily. Take with or immediately following a meal.   metoprolol tartrate 25 MG tablet Commonly known as: LOPRESSOR Take 1 tablet (25 mg total) by mouth once as needed for up to 1 dose (palpitations).   multivitamin with minerals Tabs tablet Take 1 tablet by mouth daily. Centrum Silver   nitroGLYCERIN 0.4 MG SL tablet Commonly known as: NITROSTAT Place 1 tablet (0.4 mg total) under the tongue every 5 (five) minutes as needed for chest pain (maximum of 3 tablets).       Follow-up Information    Biagio Borg, MD Follow up in 1 week(s).   Specialties: Internal Medicine, Radiology Contact information: [place] [address] [phone]              Allergies  Allergen Reactions  . Doxycycline Diarrhea    Possible diarrhea  . Biaxin [Clarithromycin] Other (See Comments)    diarrhea  . Metronidazole Other (See Comments)    Pt had difficulty swallowing this and says it was "horrible" to take. No allergic  reaction.        Procedures/Studies: DG Chest 2 View  Result Date: 12/08/2019 CLINICAL DATA:  86 year old female with palpitations. EXAM: CHEST - 2 VIEW COMPARISON:  Chest radiographs 11/26/2019 and earlier. FINDINGS: Cardiomegaly is increased since 2017. Tortuosity of the thoracic aorta has mildly increased. Other mediastinal contours are within normal limits. Visualized tracheal air column is within normal limits. Lower lung volumes today. Chronic mild elevation of the left hemidiaphragm. No pneumothorax, pulmonary edema, pleural effusion or confluent pulmonary opacity. Negative visible bowel gas pattern. No acute osseous abnormality identified. IMPRESSION: No acute cardiopulmonary abnormality. Cardiomegaly and tortuous aorta. Electronically Signed   By: Genevie Ann M.D.   On: 12/08/2019 18:21   CT Angio Chest PE W and/or Wo Contrast  Result Date: 12/09/2019 CLINICAL DATA:  Chest pain, shortness of breath EXAM: CT ANGIOGRAPHY CHEST WITH CONTRAST TECHNIQUE: Multidetector CT imaging of the chest was performed using the standard protocol during bolus administration of intravenous contrast. Multiplanar CT image reconstructions and MIPs were obtained to evaluate the vascular anatomy. CONTRAST:  181mL OMNIPAQUE IOHEXOL 350 MG/ML SOLN COMPARISON:  Chest x-ray 12/08/2019 FINDINGS: Cardiovascular: No filling defects in the pulmonary arteries to suggest pulmonary emboli. Cardiomegaly. Distal aortic arch mildly  dilated at 3.9 cm. Aortic atherosclerosis and coronary artery disease. Mediastinum/Nodes: No mediastinal, hilar, or axillary adenopathy. Trachea and esophagus are unremarkable. Thyroid unremarkable. Lungs/Pleura: No confluent opacities or effusions. Upper Abdomen: Imaging into the upper abdomen demonstrates no acute findings. Musculoskeletal: Chest wall soft tissues are unremarkable. No acute bony abnormality. Review of the MIP images confirms the above findings. IMPRESSION: No evidence of pulmonary embolus.  Mildly aneurysmal distal aortic arch, 3.9 cm. Cardiomegaly.  Coronary artery disease. No acute cardiopulmonary disease. Aortic Atherosclerosis (ICD10-I70.0). Electronically Signed   By: Rolm Baptise M.D.   On: 12/09/2019 03:02   DG Chest Port 1 View  Result Date: 11/26/2019 CLINICAL DATA:  Chest pain. EXAM: PORTABLE CHEST 1 VIEW COMPARISON:  June 01, 2019 FINDINGS: Transcutaneous pacer leads obscure the left side of the heart mediastinum as well as a large portion left lung. No pneumothorax. The lungs are clear within visualize limits. No other interval changes or acute abnormalities. IMPRESSION: The study is partially limited due to obscuration from transcutaneous pacer leads. No acute abnormalities are noted. Electronically Signed   By: Dorise Bullion III M.D   On: 11/26/2019 14:08       Subjective: Patient is feeling better, no nausea or vomiting, no chest pain or dyspnea, has been out of bed with physical therapy.   Discharge Exam: Vitals:   12/10/19 0446 12/10/19 0929  BP: 122/68 (!) 144/73  Pulse:  79  Resp: 18   Temp: 98.7 F (37.1 C)   SpO2:     Vitals:   12/09/19 1900 12/10/19 0035 12/10/19 0446 12/10/19 0929  BP: 130/88 127/78 122/68 (!) 144/73  Pulse: 80   79  Resp: 18 18 18    Temp: 98 F (36.7 C) 98.7 F (37.1 C) 98.7 F (37.1 C)   TempSrc: Oral Oral Oral   SpO2: 100%     Weight:   51.3 kg   Height:        General: Not in pain or dyspnea.  Neurology: Awake and alert, non focal  E ENT: no pallor, no icterus, oral mucosa moist Cardiovascular: No JVD. S1-S2 present, rhythmic, no gallops, rubs, or murmurs. No lower extremity edema. Pulmonary: positive breath sounds bilaterally, adequate air movement, no wheezing, rhonchi or rales. Gastrointestinal. Abdomen soft and non tender Skin. No rashes Musculoskeletal: no joint deformities   The results of significant diagnostics from this hospitalization (including imaging, microbiology, ancillary and laboratory) are  listed below for reference.     Microbiology: Recent Results (from the past 240 hour(s))  SARS Coronavirus 2 by RT PCR (hospital order, performed in [facility] hospital lab) Nasopharyngeal Nasopharyngeal Swab     Status: None   Collection Time: 12/09/19  2:06 AM   Specimen: Nasopharyngeal Swab  Result Value Ref Range Status   SARS Coronavirus 2 NEGATIVE NEGATIVE Final    Comment: (NOTE) SARS-CoV-2 target nucleic acids are NOT DETECTED.  The SARS-CoV-2 RNA is generally detectable in upper and lower respiratory specimens during the acute phase of infection. The lowest concentration of SARS-CoV-2 viral copies this assay can detect is 250 copies / mL. A negative result does not preclude SARS-CoV-2 infection and should not be used as the sole basis for treatment or other patient management decisions.  A negative result may occur with improper specimen collection / handling, submission of specimen other than nasopharyngeal swab, presence of viral mutation(s) within the areas targeted by this assay, and inadequate number of viral copies (<250 copies / mL). A negative result must be combined  with clinical observations, patient history, and epidemiological information.  Fact Sheet for Patients:   StrictlyIdeas.no  Fact Sheet for Healthcare Providers: BankingDealers.co.za  This test is not yet approved or  cleared by the [place] FDA and has been authorized for detection and/or diagnosis of SARS-CoV-2 by FDA under an Emergency Use Authorization (EUA).  This EUA will remain in effect (meaning this test can be used) for the duration of the COVID-19 declaration under Section 564(b)(1) of the Act, 21 U.S.C. section 360bbb-3(b)(1), unless the authorization is terminated or revoked sooner.  Performed at [facility], [place] [address] [place], [place] [address]      Labs: BNP (last 3 results) No results for input(s): BNP in the  last 8760 hours. Basic Metabolic Panel: Recent Labs  Lab 12/08/19 1732 12/09/19 0342 12/10/19 0434  NA 128* 136 139  K 4.6 3.9 4.2  CL 94* 98 102  CO2 23 24 26   GLUCOSE 175* 115* 97  BUN 14 11 16   CREATININE 1.12* 0.86 1.28*  CALCIUM 9.3 10.0 9.6   Liver Function Tests: No results for input(s): AST, ALT, ALKPHOS, BILITOT, PROT, ALBUMIN in the last 168 hours. No results for input(s): LIPASE, AMYLASE in the last 168 hours. No results for input(s): AMMONIA in the last 168 hours. CBC: Recent Labs  Lab 12/08/19 1732 12/09/19 0342  WBC 6.3 6.0  HGB 12.5 13.5  HCT 38.3 40.1  MCV 104.6* 100.0  PLT 230 258   Cardiac Enzymes: No results for input(s): CKTOTAL, CKMB, CKMBINDEX, TROPONINI in the last 168 hours. BNP: Invalid input(s): POCBNP CBG: Recent Labs  Lab 12/10/19 0748  GLUCAP 95   D-Dimer No results for input(s): DDIMER in the last 72 hours. Hgb A1c No results for input(s): HGBA1C in the last 72 hours. Lipid Profile No results for input(s): CHOL, HDL, LDLCALC, TRIG, CHOLHDL, LDLDIRECT in the last 72 hours. Thyroid function studies No results for input(s): TSH, T4TOTAL, T3FREE, THYROIDAB in the last 72 hours.  Invalid input(s): FREET3 Anemia work up No results for input(s): VITAMINB12, FOLATE, FERRITIN, TIBC, IRON, RETICCTPCT in the last 72 hours. Urinalysis    Component Value Date/Time   COLORURINE STRAW (A) 11/26/2019 1301   APPEARANCEUR CLEAR 11/26/2019 1301   LABSPEC 1.004 (L) 11/26/2019 1301   LABSPEC 1.010 04/03/2011 1135   PHURINE 7.0 11/26/2019 1301   GLUCOSEU 150 (A) 11/26/2019 1301   GLUCOSEU NEGATIVE 09/16/2019 1500   HGBUR NEGATIVE 11/26/2019 1301   BILIRUBINUR NEGATIVE 11/26/2019 1301   BILIRUBINUR Neg 05/08/2014 1052   BILIRUBINUR Negative 04/03/2011 1135   KETONESUR 5 (A) 11/26/2019 1301   PROTEINUR NEGATIVE 11/26/2019 1301   UROBILINOGEN 0.2 09/16/2019 1500   NITRITE NEGATIVE 11/26/2019 1301   LEUKOCYTESUR NEGATIVE 11/26/2019 1301    LEUKOCYTESUR Moderate 04/03/2011 1135   Sepsis Labs Invalid input(s): PROCALCITONIN,  WBC,  LACTICIDVEN Microbiology Recent Results (from the past 240 hour(s))  SARS Coronavirus 2 by RT PCR (hospital order, performed in [facility] hospital lab) Nasopharyngeal Nasopharyngeal Swab     Status: None   Collection Time: 12/09/19  2:06 AM   Specimen: Nasopharyngeal Swab  Result Value Ref Range Status   SARS Coronavirus 2 NEGATIVE NEGATIVE Final    Comment: (NOTE) SARS-CoV-2 target nucleic acids are NOT DETECTED.  The SARS-CoV-2 RNA is generally detectable in upper and lower respiratory specimens during the acute phase of infection. The lowest concentration of SARS-CoV-2 viral copies this assay can detect is 250 copies / mL. A negative result does not preclude  SARS-CoV-2 infection and should not be used as the sole basis for treatment or other patient management decisions.  A negative result may occur with improper specimen collection / handling, submission of specimen other than nasopharyngeal swab, presence of viral mutation(s) within the areas targeted by this assay, and inadequate number of viral copies (<250 copies / mL). A negative result must be combined with clinical observations, patient history, and epidemiological information.  Fact Sheet for Patients:   StrictlyIdeas.no  Fact Sheet for Healthcare Providers: BankingDealers.co.za  This test is not yet approved or  cleared by the [place] FDA and has been authorized for detection and/or diagnosis of SARS-CoV-2 by FDA under an Emergency Use Authorization (EUA).  This EUA will remain in effect (meaning this test can be used) for the duration of the COVID-19 declaration under Section 564(b)(1) of the Act, 21 U.S.C. section 360bbb-3(b)(1), unless the authorization is terminated or revoked sooner.  Performed at [facility], [place] [address], [place], [place] [address]       Time coordinating discharge: 45 minutes  SIGNED:   Tawni Millers, MD  [facility] 12/10/2019, 11:38 AM

## 2019-12-13 ENCOUNTER — Ambulatory Visit (INDEPENDENT_AMBULATORY_CARE_PROVIDER_SITE_OTHER): Payer: Medicare Other | Admitting: Cardiology

## 2019-12-13 ENCOUNTER — Other Ambulatory Visit: Payer: Self-pay

## 2019-12-13 ENCOUNTER — Encounter: Payer: Self-pay | Admitting: Cardiology

## 2019-12-13 VITALS — BP 154/92 | HR 84 | Ht 64.0 in | Wt 113.0 lb

## 2019-12-13 DIAGNOSIS — Z8719 Personal history of other diseases of the digestive system: Secondary | ICD-10-CM

## 2019-12-13 DIAGNOSIS — I480 Paroxysmal atrial fibrillation: Secondary | ICD-10-CM

## 2019-12-13 MED ORDER — AMIODARONE HCL 200 MG PO TABS: ORAL_TABLET | ORAL | 3 refills | Status: DC

## 2019-12-13 NOTE — Patient Instructions (Addendum)
Medication Instructions:  Your physician has recommended you make the following change in your medication:   1.  START taking amiodarone 200 mg--  A.  Take 2 tablets (400 mg) by mouth TWICE a day for 7 days.  B.  THEN decrease to one tablet (200 mg) by mouth daily   Lab Work: You will get lab work today:  CMP and TSH  If you have labs (blood work) drawn today and your tests are completely normal, you will receive your results only by: [name] MyChart Message (if you have MyChart) OR . A paper copy in the mail If you have any lab test that is abnormal or we need to change your treatment, we will call you to review the results.  Testing/Procedures: None ordered.  Follow-Up: At [facility], you and your health needs are our priority.  As part of our continuing mission to provide you with exceptional heart care, we have created designated Provider Care Teams.  These Care Teams include your primary Cardiologist (physician) and Advanced Practice Providers (APPs -  Physician Assistants and Nurse Practitioners) who all work together to provide you with the care you need, when you need it.  Your next appointment:    Your physician wants you to follow-up in: 3 months with Dr. Quentin Ore.    March 08, 2020 at 11:30 am at the [facility] office.    Amiodarone tablets What is this medicine? AMIODARONE (a MEE oh da rone) is an antiarrhythmic drug. It helps make your heart beat regularly. Because of the side effects caused by this medicine, it is only used when other medicines have not worked. It is usually used for heartbeat problems that may be life threatening. This medicine may be used for other purposes; ask your health care provider or pharmacist if you have questions. COMMON BRAND NAME(S): Cordarone, Pacerone What should I tell my health care provider before I take this medicine? They need to know if you have any of these conditions:  liver disease  lung disease  other heart  problems  thyroid disease  an unusual or allergic reaction to amiodarone, iodine, other medicines, foods, dyes, or preservatives  pregnant or trying to get pregnant  breast-feeding How should I use this medicine? Take this medicine by mouth with a glass of water. Follow the directions on the prescription label. You can take this medicine with or without food. However, you should always take it the same way each time. Take your doses at regular intervals. Do not take your medicine more often than directed. Do not stop taking except on the advice of your doctor or health care professional. A special MedGuide will be given to you by the pharmacist with each prescription and refill. Be sure to read this information carefully each time. Talk to your pediatrician regarding the use of this medicine in children. Special care may be needed. Overdosage: If you think you have taken too much of this medicine contact a poison control center or emergency room at once. NOTE: This medicine is only for you. Do not share this medicine with others. What if I miss a dose? If you miss a dose, take it as soon as you can. If it is almost time for your next dose, take only that dose. Do not take double or extra doses. What may interact with this medicine? Do not take this medicine with any of the following medications:  abarelix  apomorphine  arsenic trioxide  certain antibiotics like erythromycin, gemifloxacin, levofloxacin, pentamidine  certain medicines for depression like amoxapine, tricyclic antidepressants  certain medicines for fungal infections like fluconazole, itraconazole, ketoconazole, posaconazole, voriconazole  certain medicines for irregular heart beat like disopyramide, dronedarone, ibutilide, propafenone, sotalol  certain medicines for malaria like chloroquine, halofantrine  cisapride  droperidol  haloperidol  haw[place]orn  maprotiline  methadone  phenothiazines like  chlorpromazine, mesoridazine, thioridazine  pimozide  ranolazine  red yeast rice  vardenafil This medicine may also interact with the following medications:  antiviral medicines for HIV or AIDS  certain medicines for blood pressure, heart disease, irregular heart beat  certain medicines for cholesterol like atorvastatin, cerivastatin, lovastatin, simvastatin  certain medicines for hepatitis C like sofosbuvir and ledipasvir; sofosbuvir  certain medicines for seizures like phenytoin  certain medicines for thyroid problems  certain medicines that treat or prevent blood clots like warfarin  cholestyramine  cimetidine  clopidogrel  cyclosporine  dextromethorphan  diuretics  dofetilide  fentanyl  general anesthetics  grapefruit juice  lidocaine  loratadine  methotrexate  other medicines that prolong the QT interval (cause an abnormal heart rhythm)  procainamide  quinidine  rifabutin, rifampin, or rifapentine  St. John's Wort  trazodone  ziprasidone This list may not describe all possible interactions. Give your health care provider a list of all the medicines, herbs, non-prescription drugs, or dietary supplements you use. Also tell them if you smoke, drink alcohol, or use illegal drugs. Some items may interact with your medicine. What should I watch for while using this medicine? Your condition will be monitored closely when you first begin therapy. Often, this drug is first started in a hospital or other monitored health care setting. Once you are on maintenance therapy, visit your doctor or health care professional for regular checks on your progress. Because your condition and use of this medicine carry some risk, it is a good idea to carry an identification card, necklace or bracelet with details of your condition, medications, and doctor or health care professional. Dennis Bast may get drowsy or dizzy. Do not drive, use machinery, or do anything that needs  mental alertness until you know how this medicine affects you. Do not stand or sit up quickly, especially if you are an older patient. This reduces the risk of dizzy or fainting spells. This medicine can make you more sensitive to the sun. Keep out of the sun. If you cannot avoid being in the sun, wear protective clothing and use sunscreen. Do not use sun lamps or tanning beds/booths. You should have regular eye exams before and during treatment. Call your doctor if you have blurred vision, see halos, or your eyes become sensitive to light. Your eyes may get dry. It may be helpful to use a lubricating eye solution or artificial tears solution. If you are going to have surgery or a procedure that requires contrast dyes, tell your doctor or health care professional that you are taking this medicine. What side effects may I notice from receiving this medicine? Side effects that you should report to your doctor or health care professional as soon as possible:  allergic reactions like skin rash, itching or hives, swelling of the face, lips, or tongue  blue-gray coloring of the skin  blurred vision, seeing blue green halos, increased sensitivity of the eyes to light  breathing problems  chest pain  dark urine  fast, irregular heartbeat  feeling faint or light-headed  intolerance to heat or cold  nausea or vomiting  pain and swelling of the scrotum  pain, tingling, numbness in feet,  hands  redness, blistering, peeling or loosening of the skin, including inside the mouth  spitting up blood  stomach pain  sweating  unusual or uncontrolled movements of body  unusually weak or tired  weight gain or loss  yellowing of the eyes or skin Side effects that usually do not require medical attention (report to your doctor or health care professional if they continue or are bothersome):  change in sex drive or performance  constipation  dizziness  headache  loss of  appetite  trouble sleeping This list may not describe all possible side effects. Call your doctor for medical advice about side effects. You may report side effects to FDA at 1-800-FDA-1088. Where should I keep my medicine? Keep out of the reach of children. Store at room temperature between 20 and 25 degrees C (68 and 77 degrees F). Protect from light. Keep container tightly closed. Throw away any unused medicine after the expiration date. NOTE: This sheet is a summary. It may not cover all possible information. If you have questions about this medicine, talk to your doctor, pharmacist, or health care provider.  2020 Elsevier/Gold Standard (2018-02-17 13:44:04)

## 2019-12-13 NOTE — Progress Notes (Signed)
Electrophysiology Office Note:    Date:  12/13/2019   ID:  Lindsay Doyle April 21, 1933, MRN [id number]  PCP:  Biagio Borg, MD  [facility] Cardiologist:  Sherren Mocha, MD  [facility] Electrophysiologist:  Vickie Epley, MD   Referring MD: Liliane Shi, PA-C   Chief Complaint: palpitations  History of Present Illness:    Lindsay Doyle is a 86 y.o. female who presents for an opinion regarding atrial fibrillation at the request of Dr Burt Knack. She was recently hospitalized 12/08/2019 through 12/10/2019 for palpitations thought to be secondary to her atrial fibrillation. She remained in normal sinus rhythm throughout the hospitalization. She is off anticoagulation due to a prior GI bleed. She also had an episode of SVT/AF requiring electrical cardioversion in August 2021. She previously had an episode of AF w/ RVR in March that required an ER visit. He ultimately converted back to sinus with IV diltiazem.     Past Medical History:  Diagnosis Date  . ANEMIA-IRON DEFICIENCY    presumed GI bleed 08/2012- anticoag stopped  . Atrial fibrillation ([place])    a. Apixaban started 03/2012 - stopped after admx for GI bleed 6/14  . Blood transfusion    a. 05/2010: 3 transfusions for anemia/GIB.  [name] CAD (coronary artery disease)    a. LHC 1/14:  mLAD serial 30 and 60%, oRCA 30%, EF 60%, Afib,   . Cataract   . COLONIC POLYPS, HX OF 03/09/2007  . COPD 03/09/2007   ? pt is unsure  . GERD (gastroesophageal reflux disease)   . GI bleed    thought to be due to AVMs  . Hx of echocardiogram    a. echo 2/11:  EF 55-60%, mild AI, mild RAE, mild to mod TR;   b.  a. Echo 2/14:  Mild focal basal and mild LVH, EF 60-65%, mild AI, mild RAE, trivia effusion  . HYPERGLYCEMIA 07/02/2007  . Hyperlipidemia   . Hypertension 11/12/2006  . LEUKOPENIA, MILD 05/30/2008  . Mitral valve prolapse    Remotely - last echo 2011 did not show this  . NSTEMI (non-ST elevated myocardial infarction) ([place]) 03/2012   a.  Type II in setting of AF with RVR 03/2012  . OSTEOARTHRITIS 02/19/2009   Spinal  . OSTEOPOROSIS 11/12/2006  . Positive H. pylori test 10/1996  . PSVT    a. Remotely, in setting of anemia.  [name] Spinal stenosis of lumbar region 07/16/2012  . Transfusion history    7/15     Past Surgical History:  Procedure Laterality Date  . ABDOMINAL HYSTERECTOMY    . CARDIAC CATHETERIZATION  1992   S/P in 1992 at [facility] in [place]. This was negative for any coronary artery disease  . carotid duplex  08/29/2003  . CATARACT EXTRACTION    . ceasarean    . ENTEROSCOPY N/A 10/10/2013   Procedure: ENTEROSCOPY;  Surgeon: Inda Castle, MD;  Location: [facility] ENDOSCOPY;  Service: Endoscopy;  Laterality: N/A;  . ENTEROSCOPY N/A 12/28/2014   Procedure: ENTEROSCOPY;  Surgeon: Inda Castle, MD;  Location: [facility] ENDOSCOPY;  Service: Endoscopy;  Laterality: N/A;  . ENTEROSCOPY N/A 01/20/2016   Procedure: ENTEROSCOPY;  Surgeon: Manus Gunning, MD;  Location: [facility] ENDOSCOPY;  Service: Gastroenterology;  Laterality: N/A;  . HOT HEMOSTASIS N/A 12/28/2014   Procedure: HOT HEMOSTASIS (ARGON PLASMA COAGULATION/BICAP);  Surgeon: Inda Castle, MD;  Location: [facility];  Service: Endoscopy;  Laterality: N/A;  . LEFT HEART CATHETERIZATION  WITH CORONARY ANGIOGRAM N/A 04/23/2012   Procedure: LEFT HEART CATHETERIZATION WITH CORONARY ANGIOGRAM;  Surgeon: Larey Dresser, MD;  Location: [facility] CATH LAB;  Service: Cardiovascular;  Laterality: N/A;  . TONSILLECTOMY    . TUBAL LIGATION      Current Medications: Current Meds  Medication Sig  . acetaminophen (TYLENOL) 500 MG tablet Take 2 tablets (1,000 mg total) by mouth 2 (two) times daily.  [name] atorvastatin (LIPITOR) 20 MG tablet TAKE 1 TABLET BY MOUTH EVERY DAY  . ferrous sulfate 325 (65 FE) MG tablet TAKE 1 TABLET BY MOUTH 2 TIMES DAILY WITH A MEAL. (Patient taking differently: Take 325 mg by mouth daily. TAKE 1 TABLET BY MOUTH 2 TIMES DAILY WITH A  MEAL.)  . metoprolol succinate (TOPROL-XL) 50 MG 24 hr tablet Take 1.5 tablets (75 mg total) by mouth daily. Take with or immediately following a meal.  . Multiple Vitamin (MULTIVITAMIN WITH MINERALS) TABS tablet Take 1 tablet by mouth daily. Centrum Silver  . nitroGLYCERIN (NITROSTAT) 0.4 MG SL tablet Place 1 tablet (0.4 mg total) under the tongue every 5 (five) minutes as needed for chest pain (maximum of 3 tablets).     Allergies:   Doxycycline, Biaxin [clarithromycin], and Metronidazole   Social History   Socioeconomic History  . Marital status: Married    Spouse name: Not on file  . Number of children: 1  . Years of education: Not on file  . Highest education level: Not on file  Occupational History  . Occupation: retired    Comment: retired  Tobacco Use  . Smoking status: Never Smoker  . Smokeless tobacco: Never Used  Vaping Use  . Vaping Use: Never used  Substance and Sexual Activity  . Alcohol use: No  . Drug use: No  . Sexual activity: Never  Other Topics Concern  . Not on file  Social History Narrative  . Not on file   Social Determinants of Health   Financial Resource Strain:   . Difficulty of Paying Living Expenses: Not on file  Food Insecurity:   . Worried About Charity fundraiser in the Last Year: Not on file  . Ran Out of Food in the Last Year: Not on file  Transportation Needs:   . Lack of Transportation (Medical): Not on file  . Lack of Transportation (Non-Medical): Not on file  Physical Activity:   . Days of Exercise per Week: Not on file  . Minutes of Exercise per Session: Not on file  Stress:   . Feeling of Stress : Not on file  Social Connections:   . Frequency of Communication with Friends and Family: Not on file  . Frequency of Social Gatherings with Friends and Family: Not on file  . Attends Religious Services: Not on file  . Active Member of Clubs or Organizations: Not on file  . Attends Archivist Meetings: Not on file  .  Marital Status: Not on file     Family History: The patient's family history includes Colon cancer in her brother; Prostate cancer in her brother; Sarcoidosis in her sister and another family member; Stomach cancer in her mother. There is no history of CAD.  ROS:   Please see the history of present illness.    All other systems reviewed and are negative.  EKGs/Labs/Other Studies Reviewed:    The following studies were reviewed today:  12/10/2019 Echo personally reviewed by me LV hypertrophy, moderate at least EF normal RV thickened  EKG:  The  ekg ordered today demonstrates sinus rhythm.  Recent Labs: 09/16/2019: ALT 14 11/26/2019: Magnesium 2.1; TSH 2.171 12/09/2019: Hemoglobin 13.5; Platelets 258 12/10/2019: BUN 16; Creatinine, Ser 1.28; Potassium 4.2; Sodium 139  Recent Lipid Panel    Component Value Date/Time   CHOL 170 09/16/2019 1451   TRIG 125.0 09/16/2019 1451   HDL 69.70 09/16/2019 1451   CHOLHDL 2 09/16/2019 1451   VLDL 25.0 09/16/2019 1451   LDLCALC 75 09/16/2019 1451   LDLDIRECT 106.0 03/18/2019 1137    Physical Exam:    VS:  BP (!) 154/92   Pulse 84   Ht 5\' 4"  (1.626 m)   Wt 113 lb (51.3 kg)   SpO2 96%   BMI 19.40 kg/m     Wt Readings from Last 3 Encounters:  12/13/19 113 lb (51.3 kg)  12/10/19 113 lb 1.5 oz (51.3 kg)  11/30/19 113 lb (51.3 kg)     GEN:  Well nourished, well developed in no acute distress HEENT: Normal NECK: No JVD; No carotid bruits LYMPHATICS: No lymphadenopathy CARDIAC: RRR, no murmurs, rubs, gallops RESPIRATORY:  Clear to auscultation without rales, wheezing or rhonchi  ABDOMEN: Soft, non-tender, non-distended MUSCULOSKELETAL:  No edema; No deformity  SKIN: Warm and dry NEUROLOGIC:  Alert and oriented x 3 PSYCHIATRIC:  Normal affect   ASSESSMENT:    1. Paroxysmal atrial fibrillation (HCC)   2. History of GI bleed    PLAN:    In order of problems listed above:  1. Paroxysmal atrial fibrillation Hx of intermittent  palpitations leading to hospitalization, most recently within the past month. Likely this represents paroxysms of AF w/ RVR but cannot completely exclude episodes of SVT. Currently, she is on metoprolol only for her AF management. Discussed antiarrhythmic therapy as an option for maintaining normal rhythm including the risks of this therapy and she is interested in proceeding. Will check baseline liver and thyroid function and start amiodarone. Will plan to see back in 3 months.  2. Hx of GI bleed Currently off anticoagulation for her atrial fibrillation. She is not interested in re attempting anticoagulation therapy   Medication Adjustments/Labs and Tests Ordered: Current medicines are reviewed at length with the patient today.  Concerns regarding medicines are outlined above.  Orders Placed This Encounter  Procedures  . EKG 12-Lead   Meds ordered this encounter  Medications  . amiodarone (PACERONE) 200 MG tablet    Sig: Take 2 tablets by mouth twice a day for one week.  Then reduce to one tablet by mouth daily.    Dispense:  130 tablet    Refill:  3    Patient Instructions  Medication Instructions:  Your physician has recommended you make the following change in your medication:   1.  START taking amiodarone 200 mg--  A.  Take 400 bid x 1 week  B.  200 daily   Lab Work: You will get lab work today:  CMP and TSH  If you have labs (blood work) drawn today and your tests are completely normal, you will receive your results only by: [name] MyChart Message (if you have MyChart) OR . A paper copy in the mail If you have any lab test that is abnormal or we need to change your treatment, we will call you to review the results.  Testing/Procedures: None ordered.  Follow-Up: At [facility], you and your health needs are our priority.  As part of our continuing mission to provide you with exceptional heart care, we have created designated  Provider Care Teams.  These Care Teams include  your primary Cardiologist (physician) and Advanced Practice Providers (APPs -  Physician Assistants and Nurse Practitioners) who all work together to provide you with the care you need, when you need it.  Your next appointment:    Your physician wants you to follow-up in: 3 months with Dr. Quentin Ore.      Amiodarone tablets What is this medicine? AMIODARONE (a MEE oh da rone) is an antiarrhythmic drug. It helps make your heart beat regularly. Because of the side effects caused by this medicine, it is only used when other medicines have not worked. It is usually used for heartbeat problems that may be life threatening. This medicine may be used for other purposes; ask your health care provider or pharmacist if you have questions. COMMON BRAND NAME(S): Cordarone, Pacerone What should I tell my health care provider before I take this medicine? They need to know if you have any of these conditions:  liver disease  lung disease  other heart problems  thyroid disease  an unusual or allergic reaction to amiodarone, iodine, other medicines, foods, dyes, or preservatives  pregnant or trying to get pregnant  breast-feeding How should I use this medicine? Take this medicine by mouth with a glass of water. Follow the directions on the prescription label. You can take this medicine with or without food. However, you should always take it the same way each time. Take your doses at regular intervals. Do not take your medicine more often than directed. Do not stop taking except on the advice of your doctor or health care professional. A special MedGuide will be given to you by the pharmacist with each prescription and refill. Be sure to read this information carefully each time. Talk to your pediatrician regarding the use of this medicine in children. Special care may be needed. Overdosage: If you think you have taken too much of this medicine contact a poison control center or emergency room at  once. NOTE: This medicine is only for you. Do not share this medicine with others. What if I miss a dose? If you miss a dose, take it as soon as you can. If it is almost time for your next dose, take only that dose. Do not take double or extra doses. What may interact with this medicine? Do not take this medicine with any of the following medications:  abarelix  apomorphine  arsenic trioxide  certain antibiotics like erythromycin, gemifloxacin, levofloxacin, pentamidine  certain medicines for depression like amoxapine, tricyclic antidepressants  certain medicines for fungal infections like fluconazole, itraconazole, ketoconazole, posaconazole, voriconazole  certain medicines for irregular heart beat like disopyramide, dronedarone, ibutilide, propafenone, sotalol  certain medicines for malaria like chloroquine, halofantrine  cisapride  droperidol  haloperidol  haw[place]orn  maprotiline  methadone  phenothiazines like chlorpromazine, mesoridazine, thioridazine  pimozide  ranolazine  red yeast rice  vardenafil This medicine may also interact with the following medications:  antiviral medicines for HIV or AIDS  certain medicines for blood pressure, heart disease, irregular heart beat  certain medicines for cholesterol like atorvastatin, cerivastatin, lovastatin, simvastatin  certain medicines for hepatitis C like sofosbuvir and ledipasvir; sofosbuvir  certain medicines for seizures like phenytoin  certain medicines for thyroid problems  certain medicines that treat or prevent blood clots like warfarin  cholestyramine  cimetidine  clopidogrel  cyclosporine  dextromethorphan  diuretics  dofetilide  fentanyl  general anesthetics  grapefruit juice  lidocaine  loratadine  methotrexate  other medicines that  prolong the QT interval (cause an abnormal heart rhythm)  procainamide  quinidine  rifabutin, rifampin, or rifapentine  St. John's  Wort  trazodone  ziprasidone This list may not describe all possible interactions. Give your health care provider a list of all the medicines, herbs, non-prescription drugs, or dietary supplements you use. Also tell them if you smoke, drink alcohol, or use illegal drugs. Some items may interact with your medicine. What should I watch for while using this medicine? Your condition will be monitored closely when you first begin therapy. Often, this drug is first started in a hospital or other monitored health care setting. Once you are on maintenance therapy, visit your doctor or health care professional for regular checks on your progress. Because your condition and use of this medicine carry some risk, it is a good idea to carry an identification card, necklace or bracelet with details of your condition, medications, and doctor or health care professional. Dennis Bast may get drowsy or dizzy. Do not drive, use machinery, or do anything that needs mental alertness until you know how this medicine affects you. Do not stand or sit up quickly, especially if you are an older patient. This reduces the risk of dizzy or fainting spells. This medicine can make you more sensitive to the sun. Keep out of the sun. If you cannot avoid being in the sun, wear protective clothing and use sunscreen. Do not use sun lamps or tanning beds/booths. You should have regular eye exams before and during treatment. Call your doctor if you have blurred vision, see halos, or your eyes become sensitive to light. Your eyes may get dry. It may be helpful to use a lubricating eye solution or artificial tears solution. If you are going to have surgery or a procedure that requires contrast dyes, tell your doctor or health care professional that you are taking this medicine. What side effects may I notice from receiving this medicine? Side effects that you should report to your doctor or health care professional as soon as possible:  allergic  reactions like skin rash, itching or hives, swelling of the face, lips, or tongue  blue-gray coloring of the skin  blurred vision, seeing blue green halos, increased sensitivity of the eyes to light  breathing problems  chest pain  dark urine  fast, irregular heartbeat  feeling faint or light-headed  intolerance to heat or cold  nausea or vomiting  pain and swelling of the scrotum  pain, tingling, numbness in feet, hands  redness, blistering, peeling or loosening of the skin, including inside the mouth  spitting up blood  stomach pain  sweating  unusual or uncontrolled movements of body  unusually weak or tired  weight gain or loss  yellowing of the eyes or skin Side effects that usually do not require medical attention (report to your doctor or health care professional if they continue or are bothersome):  change in sex drive or performance  constipation  dizziness  headache  loss of appetite  trouble sleeping This list may not describe all possible side effects. Call your doctor for medical advice about side effects. You may report side effects to FDA at 1-800-FDA-1088. Where should I keep my medicine? Keep out of the reach of children. Store at room temperature between 20 and 25 degrees C (68 and 77 degrees F). Protect from light. Keep container tightly closed. Throw away any unused medicine after the expiration date. NOTE: This sheet is a summary. It may not cover all possible  information. If you have questions about this medicine, talk to your doctor, pharmacist, or health care provider.  2020 Elsevier/Gold Standard (2018-02-17 13:44:04)       Signed, Lars Mage, MD, [facility]  12/13/2019 3:18 PM    Electrophysiology [facility]

## 2019-12-14 ENCOUNTER — Telehealth: Payer: Self-pay | Admitting: Cardiology

## 2019-12-14 DIAGNOSIS — I480 Paroxysmal atrial fibrillation: Secondary | ICD-10-CM

## 2019-12-14 LAB — COMPREHENSIVE METABOLIC PANEL
ALT: 18 IU/L (ref 0–32)
AST: 17 IU/L (ref 0–40)
Albumin/Globulin Ratio: 1.9 (ref 1.2–2.2)
Albumin: 4.7 g/dL — ABNORMAL HIGH (ref 3.6–4.6)
Alkaline Phosphatase: 65 IU/L (ref 44–121)
BUN/Creatinine Ratio: 16 (ref 12–28)
BUN: 18 mg/dL (ref 8–27)
Bilirubin Total: 0.2 mg/dL (ref 0.0–1.2)
CO2: 23 mmol/L (ref 20–29)
Calcium: 9.6 mg/dL (ref 8.7–10.3)
Chloride: 98 mmol/L (ref 96–106)
Creatinine, Ser: 1.12 mg/dL — ABNORMAL HIGH (ref 0.57–1.00)
GFR calc Af Amer: 52 mL/min/{1.73_m2} — ABNORMAL LOW (ref >59)
GFR calc non Af Amer: 45 mL/min/{1.73_m2} — ABNORMAL LOW (ref >59)
Globulin, Total: 2.5 g/dL (ref 1.5–4.5)
Glucose: 101 mg/dL — ABNORMAL HIGH (ref 65–99)
Potassium: 5.1 mmol/L (ref 3.5–5.2)
Sodium: 137 mmol/L (ref 134–144)
Total Protein: 7.2 g/dL (ref 6.0–8.5)

## 2019-12-14 LAB — TSH: TSH: 1.63 u[IU]/mL (ref 0.450–4.500)

## 2019-12-14 NOTE — Telephone Encounter (Signed)
Returned call to Pt.    Pt is unwilling to start amiodarone.  Pt states she read all the paperwork given to her by her pharmacist.  Pt is willing to wear a heart monitor.  She was not discharged from the hospital wearing one.  Will discuss with Dr. Quentin Ore and return Pt call tomorrow.

## 2019-12-14 NOTE — Telephone Encounter (Signed)
Pt c/o medication issue:  1. Name of Medication: amiodarone (PACERONE) 200 MG tablet  2. How are you currently taking this medication (dosage and times per day)? Has not taken medication   3. Are you having a reaction (difficulty breathing--STAT)? No   4. What is your medication issue? Reana is calling stating is not going to take this medication. She states the side effects listed are terrible. Please advise.

## 2019-12-15 ENCOUNTER — Other Ambulatory Visit: Payer: Self-pay | Admitting: Internal Medicine

## 2019-12-15 ENCOUNTER — Telehealth: Payer: Self-pay | Admitting: *Deleted

## 2019-12-15 NOTE — Telephone Encounter (Signed)
Please refill as per office routine med refill policy (all routine meds refilled for 3 mo or monthly per pt preference up to one year from last visit, then month to month grace period for 3 mo, then further med refills will have to be denied)  

## 2019-12-15 NOTE — Telephone Encounter (Signed)
Per Dr. Henri Medal Pt wear zio monitor.  Will send message to monitor staff.  Will need Pt to come in for placement.  Await if Pt can be scheduled with appt vs bringing the monitor in for this nurse to place.

## 2019-12-15 NOTE — Telephone Encounter (Signed)
Patient scheduled to come into office with her husband on Monday 12/19/2019 at 10:00AM to have 14 day ZIO XT monitor applied.  She will bring monitor which is being shipped to her home.  If her monitor has not arrived, I will apply a monitor from our office inventory and instruct them how to return unused monitor in prepaid [employer]PS package.

## 2019-12-16 ENCOUNTER — Other Ambulatory Visit (HOSPITAL_COMMUNITY): Payer: Medicare Other

## 2019-12-19 ENCOUNTER — Ambulatory Visit (INDEPENDENT_AMBULATORY_CARE_PROVIDER_SITE_OTHER): Payer: Medicare Other

## 2019-12-19 ENCOUNTER — Other Ambulatory Visit: Payer: Self-pay

## 2019-12-19 DIAGNOSIS — I480 Paroxysmal atrial fibrillation: Secondary | ICD-10-CM | POA: Diagnosis not present

## 2019-12-19 NOTE — Telephone Encounter (Signed)
F/u based on HM results.

## 2019-12-20 ENCOUNTER — Other Ambulatory Visit (HOSPITAL_COMMUNITY): Payer: Medicare Other

## 2019-12-20 ENCOUNTER — Telehealth: Payer: Self-pay | Admitting: Cardiology

## 2019-12-20 NOTE — Telephone Encounter (Signed)
Patient wanted to know if she is allowed to take exercise classes while she is wearing her short term monitor. Please advise

## 2019-12-22 NOTE — Telephone Encounter (Signed)
Returned patients call.  Instructed exercise with monitor is ok.  lAvoid excessive perspiration as it  may make monitor fall off prematurely.  Lindsay Doyle states she will turn up the air when she exercises.

## 2020-01-16 DIAGNOSIS — I480 Paroxysmal atrial fibrillation: Secondary | ICD-10-CM | POA: Diagnosis not present

## 2020-01-18 ENCOUNTER — Other Ambulatory Visit: Payer: Self-pay | Admitting: Cardiovascular Disease

## 2020-01-18 ENCOUNTER — Telehealth: Payer: Self-pay

## 2020-01-18 MED ORDER — METOPROLOL SUCCINATE ER 50 MG PO TB24
75.0000 mg | ORAL_TABLET | Freq: Every day | ORAL | 3 refills | Status: DC
Start: 2020-01-18 — End: 2021-03-11

## 2020-01-18 NOTE — Telephone Encounter (Signed)
Error

## 2020-01-18 NOTE — Telephone Encounter (Signed)
*  STAT* If patient is at the pharmacy, call can be transferred to refill team.   1. Which medications need to be refilled? (please list name of each medication and dose if known)   metoprolol succinate (TOPROL-XL) 50 MG 24 hr tablet  Dose is 1.5 tablets per day   2. Which pharmacy/location (including street and city if local pharmacy) is medication to be sent to?  [facility] [place] [facility] [place]  3. Do they need a 30 day or 90 day supply?   90 day

## 2020-01-19 ENCOUNTER — Other Ambulatory Visit: Payer: Self-pay

## 2020-01-19 MED ORDER — NITROGLYCERIN 0.4 MG SL SUBL: 0.4000 mg | SUBLINGUAL_TABLET | SUBLINGUAL | 7 refills | Status: DC | PRN

## 2020-01-19 NOTE — Telephone Encounter (Signed)
Pt's medication was sent to pt's pharmacy as requested. Confirmation received.  °

## 2020-01-23 ENCOUNTER — Telehealth: Payer: Self-pay | Admitting: Cardiovascular Disease

## 2020-01-23 NOTE — Telephone Encounter (Signed)
New message:      Patient calling stating that she would like for someone to call her concering some monitor results. Please call patient back.

## 2020-01-23 NOTE — Telephone Encounter (Signed)
Returned call to Pt.  Scheduled f/u appt with Dr. Quentin Ore to discuss HM results.  Pt did not start amiodarone after last OV.

## 2020-01-23 NOTE — Telephone Encounter (Signed)
Patient called again about results.

## 2020-01-25 ENCOUNTER — Other Ambulatory Visit: Payer: Self-pay

## 2020-01-25 ENCOUNTER — Ambulatory Visit (INDEPENDENT_AMBULATORY_CARE_PROVIDER_SITE_OTHER): Payer: Medicare Other | Admitting: Cardiology

## 2020-01-25 VITALS — BP 122/80 | HR 82 | Ht 64.0 in | Wt 114.2 lb

## 2020-01-25 DIAGNOSIS — Z8719 Personal history of other diseases of the digestive system: Secondary | ICD-10-CM

## 2020-01-25 DIAGNOSIS — Z23 Encounter for immunization: Secondary | ICD-10-CM

## 2020-01-25 DIAGNOSIS — I480 Paroxysmal atrial fibrillation: Secondary | ICD-10-CM | POA: Diagnosis not present

## 2020-01-25 NOTE — Progress Notes (Signed)
Electrophysiology Office Follow up Visit Note:    Date:  01/25/2020   ID:  Lindsay Doyle, Lindsay Doyle Aug 05, 1933, MRN [id number]  PCP:  Lindsay Borg, MD  [facility] Cardiologist:  Sherren Mocha, MD  [facility] Electrophysiologist:  Vickie Epley, MD    Interval History:    Lindsay Doyle is a 86 y.o. female who presents for a follow up visit. They were last seen in clinic on December 13, 2019 for paroxysmal atrial fibrillation.  At that visit amiodarone was prescribed but she never started the medication.  She also wore a heart monitor after that visit which did not show atrial fibrillation.  There were 5 episodes of PSVT (longest episode lasting 6 beats).  Since their last appointment, she has been doing overall well.     Past Medical History:  Diagnosis Date  . ANEMIA-IRON DEFICIENCY    presumed GI bleed 08/2012- anticoag stopped  . Atrial fibrillation ([place])    a. Apixaban started 03/2012 - stopped after admx for GI bleed 6/14  . Blood transfusion    a. 05/2010: 3 transfusions for anemia/GIB.  [name] CAD (coronary artery disease)    a. LHC 1/14:  mLAD serial 30 and 60%, oRCA 30%, EF 60%, Afib,   . Cataract   . COLONIC POLYPS, HX OF 03/09/2007  . COPD 03/09/2007   ? pt is unsure  . GERD (gastroesophageal reflux disease)   . GI bleed    thought to be due to AVMs  . Hx of echocardiogram    a. echo 2/11:  EF 55-60%, mild AI, mild RAE, mild to mod TR;   b.  a. Echo 2/14:  Mild focal basal and mild LVH, EF 60-65%, mild AI, mild RAE, trivia effusion  . HYPERGLYCEMIA 07/02/2007  . Hyperlipidemia   . Hypertension 11/12/2006  . LEUKOPENIA, MILD 05/30/2008  . Mitral valve prolapse    Remotely - last echo 2011 did not show this  . NSTEMI (non-ST elevated myocardial infarction) ([place]) 03/2012   a. Type II in setting of AF with RVR 03/2012  . OSTEOARTHRITIS 02/19/2009   Spinal  . OSTEOPOROSIS 11/12/2006  . Positive H. pylori test 10/1996  . PSVT    a. Remotely, in setting of anemia.    [name] Spinal stenosis of lumbar region 07/16/2012  . Transfusion history    7/15     Past Surgical History:  Procedure Laterality Date  . ABDOMINAL HYSTERECTOMY    . CARDIAC CATHETERIZATION  1992   S/P in 1992 at [facility] in [place]. This was negative for any coronary artery disease  . carotid duplex  08/29/2003  . CATARACT EXTRACTION    . ceasarean    . ENTEROSCOPY N/A 10/10/2013   Procedure: ENTEROSCOPY;  Surgeon: Inda Castle, MD;  Location: [facility] ENDOSCOPY;  Service: Endoscopy;  Laterality: N/A;  . ENTEROSCOPY N/A 12/28/2014   Procedure: ENTEROSCOPY;  Surgeon: Inda Castle, MD;  Location: [facility] ENDOSCOPY;  Service: Endoscopy;  Laterality: N/A;  . ENTEROSCOPY N/A 01/20/2016   Procedure: ENTEROSCOPY;  Surgeon: Manus Gunning, MD;  Location: [facility] ENDOSCOPY;  Service: Gastroenterology;  Laterality: N/A;  . HOT HEMOSTASIS N/A 12/28/2014   Procedure: HOT HEMOSTASIS (ARGON PLASMA COAGULATION/BICAP);  Surgeon: Inda Castle, MD;  Location: [facility];  Service: Endoscopy;  Laterality: N/A;  . LEFT HEART CATHETERIZATION WITH CORONARY ANGIOGRAM N/A 04/23/2012   Procedure: LEFT HEART CATHETERIZATION WITH CORONARY ANGIOGRAM;  Surgeon: Larey Dresser, MD;  Location: [facility] CATH  LAB;  Service: Cardiovascular;  Laterality: N/A;  . TONSILLECTOMY    . TUBAL LIGATION      Current Medications: Current Meds  Medication Sig  . acetaminophen (TYLENOL) 500 MG tablet Take 2 tablets (1,000 mg total) by mouth 2 (two) times daily.  [name] atorvastatin (LIPITOR) 20 MG tablet TAKE 1 TABLET BY MOUTH EVERY DAY  . ferrous sulfate 325 (65 FE) MG tablet TAKE 1 TABLET BY MOUTH 2 TIMES DAILY WITH A MEAL. (Patient taking differently: Take 325 mg by mouth daily. TAKE 1 TABLET BY MOUTH 2 TIMES DAILY WITH A MEAL.)  . metoprolol succinate (TOPROL-XL) 50 MG 24 hr tablet Take 1.5 tablets (75 mg total) by mouth daily. Take with or immediately following a meal.  . Multiple Vitamin (MULTIVITAMIN WITH  MINERALS) TABS tablet Take 1 tablet by mouth daily. Centrum Silver  . nitroGLYCERIN (NITROSTAT) 0.4 MG SL tablet Place 1 tablet (0.4 mg total) under the tongue every 5 (five) minutes as needed for chest pain (maximum of 3 tablets).     Allergies:   Doxycycline, Biaxin [clarithromycin], and Metronidazole   Social History   Socioeconomic History  . Marital status: Married    Spouse name: Not on file  . Number of children: 1  . Years of education: Not on file  . Highest education level: Not on file  Occupational History  . Occupation: retired    Comment: retired  Tobacco Use  . Smoking status: Never Smoker  . Smokeless tobacco: Never Used  Vaping Use  . Vaping Use: Never used  Substance and Sexual Activity  . Alcohol use: No  . Drug use: No  . Sexual activity: Never  Other Topics Concern  . Not on file  Social History Narrative  . Not on file   Social Determinants of Health   Financial Resource Strain:   . Difficulty of Paying Living Expenses: Not on file  Food Insecurity:   . Worried About Charity fundraiser in the Last Year: Not on file  . Ran Out of Food in the Last Year: Not on file  Transportation Needs:   . Lack of Transportation (Medical): Not on file  . Lack of Transportation (Non-Medical): Not on file  Physical Activity:   . Days of Exercise per Week: Not on file  . Minutes of Exercise per Session: Not on file  Stress:   . Feeling of Stress : Not on file  Social Connections:   . Frequency of Communication with Friends and Family: Not on file  . Frequency of Social Gatherings with Friends and Family: Not on file  . Attends Religious Services: Not on file  . Active Member of Clubs or Organizations: Not on file  . Attends Archivist Meetings: Not on file  . Marital Status: Not on file     Family History: The patient's family history includes Colon cancer in her brother; Prostate cancer in her brother; Sarcoidosis in her sister and another family  member; Stomach cancer in her mother. There is no history of CAD.  ROS:   Please see the history of present illness.    All other systems reviewed and are negative.  EKGs/Labs/Other Studies Reviewed:    The following studies were reviewed today: ZIO monitor, echo   December 19, 2019 ZIO report personally reviewed HR 57-197, average 77. 5 episodes of SVT, longest lasting 6 beats with a rate of 197bpm. Rare ventricular ectopy.   December 10, 2019 echo personally reviewed Left ventricular function normal,  60% Moderate left ventricular hypertrophy the basal septal segment Mildly reduced right ventricular function Moderate thickening of the aortic valve with mild aortic stenosis   EKG:  The ekg ordered today demonstrates sinus rhythm.  Recent Labs: 11/26/2019: Magnesium 2.1 12/09/2019: Hemoglobin 13.5; Platelets 258 12/13/2019: ALT 18; BUN 18; Creatinine, Ser 1.12; Potassium 5.1; Sodium 137; TSH 1.630  Recent Lipid Panel    Component Value Date/Time   CHOL 170 09/16/2019 1451   TRIG 125.0 09/16/2019 1451   HDL 69.70 09/16/2019 1451   CHOLHDL 2 09/16/2019 1451   VLDL 25.0 09/16/2019 1451   LDLCALC 75 09/16/2019 1451   LDLDIRECT 106.0 03/18/2019 1137    Physical Exam:    VS:  BP 122/80   Pulse 82   Ht 5\' 4"  (1.626 m)   Wt 114 lb 3.2 oz (51.8 kg)   SpO2 97%   BMI 19.60 kg/m     Wt Readings from Last 3 Encounters:  01/25/20 114 lb 3.2 oz (51.8 kg)  12/13/19 113 lb (51.3 kg)  12/10/19 113 lb 1.5 oz (51.3 kg)   GEN:  Well nourished, well developed in no acute distress HEENT: Normal NECK: No JVD; No carotid bruits LYMPHATICS: No lymphadenopathy CARDIAC: RRR, no murmurs, rubs, gallops RESPIRATORY:  Clear to auscultation without rales, wheezing or rhonchi  ABDOMEN: Soft, non-tender, non-distended MUSCULOSKELETAL:  No edema; No deformity  SKIN: Warm and dry NEUROLOGIC:  Alert and oriented x 3 PSYCHIATRIC:  Normal affect   ASSESSMENT:    1. Paroxysmal atrial  fibrillation (HCC)   2. History of GI bleed    PLAN:    In order of problems listed above:  1. Paroxysmal atrial fibrillation and SVT Overall low burden on recent ZIO monitor.  Patient prefers to avoid antiarrhythmic therapy if at all possible.  Patient not taking anticoagulation due to remote history of GI bleeding.  For now we will plan to continue current conservative management.  If she experiences another "attack" of paroxysmal SVT/atrial fibrillation, the patient will initiate amiodarone.  Plan to follow-up in 2 to 3 months.  Patient wishes to get the flu shot today.   Medication Adjustments/Labs and Tests Ordered: Current medicines are reviewed at length with the patient today.  Concerns regarding medicines are outlined above.  Orders Placed This Encounter  Procedures  . EKG 12-Lead   No orders of the defined types were placed in this encounter.    Signed, Lars Mage, MD, [facility]  01/25/2020 2:01 PM    Electrophysiology [facility]

## 2020-01-25 NOTE — Patient Instructions (Addendum)
Medication Instructions:  Your physician recommends that you continue on your current medications as directed. Please refer to the Current Medication list given to you today.  Labwork: None ordered.  Testing/Procedures: None ordered.  Follow-Up: Your physician wants you to follow-up in:  April 24, 2020 at 10:30 am at the [facility] office  Any Other Special Instructions Will Be Listed Below (If Applicable).  If you need a refill on your cardiac medications before your next appointment, please call your pharmacy.

## 2020-02-11 ENCOUNTER — Ambulatory Visit: Payer: Medicare Other | Attending: Internal Medicine

## 2020-02-11 DIAGNOSIS — Z23 Encounter for immunization: Secondary | ICD-10-CM

## 2020-02-11 NOTE — Progress Notes (Signed)
   Covid-19 Vaccination Clinic  Name:  Lindsay Doyle    MRN: [id number] DOB: [birth year]  02/11/2020  [name] was observed post Covid-19 immunization for 15 minutes without incident. She was provided with Vaccine Information Sheet and instruction to access the V-Safe system.   [name] was instructed to call 911 with any severe reactions post vaccine: [name] Difficulty breathing  . Swelling of face and throat  . A fast heartbeat  . A bad rash all over body  . Dizziness and weakness   Immunizations Administered    Name Date Dose VIS Date Route   Pfizer COVID-19 Vaccine 02/11/2020  4:25 PM 0.3 mL 01/18/2020 Intramuscular   Manufacturer: [place]   Lot: [id number]   [place]: [address]-3

## 2020-02-21 ENCOUNTER — Other Ambulatory Visit: Payer: Self-pay | Admitting: Physician Assistant

## 2020-03-08 ENCOUNTER — Ambulatory Visit: Payer: Medicare Other | Admitting: Cardiology

## 2020-03-15 DIAGNOSIS — L853 Xerosis cutis: Secondary | ICD-10-CM | POA: Insufficient documentation

## 2020-03-16 ENCOUNTER — Other Ambulatory Visit: Payer: Self-pay

## 2020-03-19 ENCOUNTER — Encounter: Payer: Self-pay | Admitting: Internal Medicine

## 2020-03-19 ENCOUNTER — Other Ambulatory Visit: Payer: Self-pay

## 2020-03-19 ENCOUNTER — Ambulatory Visit (INDEPENDENT_AMBULATORY_CARE_PROVIDER_SITE_OTHER): Payer: Medicare Other | Admitting: Internal Medicine

## 2020-03-19 ENCOUNTER — Telehealth: Payer: Self-pay | Admitting: Internal Medicine

## 2020-03-19 VITALS — BP 140/90 | HR 83 | Temp 97.7°F | Ht 64.0 in | Wt 114.0 lb

## 2020-03-19 DIAGNOSIS — R739 Hyperglycemia, unspecified: Secondary | ICD-10-CM

## 2020-03-19 DIAGNOSIS — I2510 Atherosclerotic heart disease of native coronary artery without angina pectoris: Secondary | ICD-10-CM | POA: Diagnosis not present

## 2020-03-19 DIAGNOSIS — I480 Paroxysmal atrial fibrillation: Secondary | ICD-10-CM

## 2020-03-19 DIAGNOSIS — M19041 Primary osteoarthritis, right hand: Secondary | ICD-10-CM

## 2020-03-19 DIAGNOSIS — M19042 Primary osteoarthritis, left hand: Secondary | ICD-10-CM | POA: Diagnosis not present

## 2020-03-19 DIAGNOSIS — I10 Essential (primary) hypertension: Secondary | ICD-10-CM | POA: Diagnosis not present

## 2020-03-19 NOTE — Patient Instructions (Signed)
Ok to use the OTC Voltaren gel as needed for pain with the hand arthritis  Please continue all other medications as before, and refills have been done if requested.  Please have the pharmacy call with any other refills you may need.  Please continue your efforts at being more active, low cholesterol diet, and weight control  Please keep your appointments with your specialists as you may have planned  Please make an Appointment to return in 6 months, or sooner if needed

## 2020-03-19 NOTE — Telephone Encounter (Signed)
ferrous sulfate 325  Patient calling to request a refill for this medication.  [facility] [place] [place] Phone:  [phone]  Fax:  [phone]     amiodarone (PACERONE) 200 MG tablet Patient also requesting this medication be taken off of her med list because she has never taken it.

## 2020-03-19 NOTE — Progress Notes (Deleted)
   Subjective:    Patient ID: Lindsay Doyle, female    DOB: 30-Sep-1933, 86 y.o.   MRN: [id number]  HPI    Review of Systems     Objective:   Physical Exam        Assessment & Plan:

## 2020-03-20 ENCOUNTER — Other Ambulatory Visit: Payer: Self-pay

## 2020-03-20 MED ORDER — FERROUS SULFATE 325 (65 FE) MG PO TABS: ORAL_TABLET | ORAL | 0 refills | Status: DC

## 2020-03-28 ENCOUNTER — Encounter: Payer: Self-pay | Admitting: Internal Medicine

## 2020-03-28 NOTE — Assessment & Plan Note (Signed)
Ok for otc voltaren gel prn,  to f/u any worsening symptoms or concerns

## 2020-03-28 NOTE — Progress Notes (Signed)
Established Patient Office Visit  Subjective:  Patient ID: Lindsay Doyle, female    DOB: January 05, 1934  Age: 86 y.o. MRN: [id number]      Chief Complaint: (concise statement describing the symptom, problem, condition, diagnosis, physician recommended return, or other factor as reason for encounter) follow up HTN, afib, oA, hyperglycemia       HPI:  Lindsay Doyle is a 86 y.o. female here to f/u; overall doing ok,  Pt denies chest pain, increasing sob or doe, wheezing, orthopnea, PND, increased LE swelling, palpitations, dizziness or syncope except for very occasional non sustained palpitations ?PSVT noted sept 2021 for which she has declined amiodarone trial..  Pt denies new neurological symptoms such as new headache, or facial or extremity weakness or numbness.  Pt denies polydipsia, polyuria, or symptomatic low sugars. Pt states overall good compliance with meds, mostly trying to follow appropriate diet, with wt overall stable,  but little exercise however. .   Wt Readings from Last 3 Encounters:  03/19/20 114 lb (51.7 kg)  01/25/20 114 lb 3.2 oz (51.8 kg)  12/13/19 113 lb (51.3 kg)   BP Readings from Last 3 Encounters:  03/19/20 140/90  01/25/20 122/80  12/13/19 (!) 154/92        Also c/o acute problem of: location/onset/quality/quantity/aggrevating/alleviating/other - bilateral hand arthritic pain , mild intermitttent worsening for 3 mo, worse in the am with stiffness, better later in the day  Past Medical History:  Diagnosis Date  . ANEMIA-IRON DEFICIENCY    presumed GI bleed 08/2012- anticoag stopped  . Atrial fibrillation ([place])    a. Apixaban started 03/2012 - stopped after admx for GI bleed 6/14  . Blood transfusion    a. 05/2010: 3 transfusions for anemia/GIB.  [name] CAD (coronary artery disease)    a. LHC 1/14:  mLAD serial 30 and 60%, oRCA 30%, EF 60%, Afib,   . Cataract   . COLONIC POLYPS, HX OF 03/09/2007  . COPD 03/09/2007   ? pt is unsure  . GERD (gastroesophageal reflux  disease)   . GI bleed    thought to be due to AVMs  . Hx of echocardiogram    a. echo 2/11:  EF 55-60%, mild AI, mild RAE, mild to mod TR;   b.  a. Echo 2/14:  Mild focal basal and mild LVH, EF 60-65%, mild AI, mild RAE, trivia effusion  . HYPERGLYCEMIA 07/02/2007  . Hyperlipidemia   . Hypertension 11/12/2006  . LEUKOPENIA, MILD 05/30/2008  . Mitral valve prolapse    Remotely - last echo 2011 did not show this  . NSTEMI (non-ST elevated myocardial infarction) ([place]) 03/2012   a. Type II in setting of AF with RVR 03/2012  . OSTEOARTHRITIS 02/19/2009   Spinal  . OSTEOPOROSIS 11/12/2006  . Positive H. pylori test 10/1996  . PSVT    a. Remotely, in setting of anemia.  [name] Spinal stenosis of lumbar region 07/16/2012  . Transfusion history    7/15    Past Surgical History:  Procedure Laterality Date  . ABDOMINAL HYSTERECTOMY    . CARDIAC CATHETERIZATION  1992   S/P in 1992 at [facility] in [place]. This was negative for any coronary artery disease  . carotid duplex  08/29/2003  . CATARACT EXTRACTION    . ceasarean    . ENTEROSCOPY N/A 10/10/2013   Procedure: ENTEROSCOPY;  Surgeon: Inda Castle, MD;  Location: [facility] ENDOSCOPY;  Service: Endoscopy;  Laterality: N/A;  . ENTEROSCOPY N/A  12/28/2014   Procedure: ENTEROSCOPY;  Surgeon: Inda Castle, MD;  Location: [facility];  Service: Endoscopy;  Laterality: N/A;  . ENTEROSCOPY N/A 01/20/2016   Procedure: ENTEROSCOPY;  Surgeon: Manus Gunning, MD;  Location: [facility] ENDOSCOPY;  Service: Gastroenterology;  Laterality: N/A;  . HOT HEMOSTASIS N/A 12/28/2014   Procedure: HOT HEMOSTASIS (ARGON PLASMA COAGULATION/BICAP);  Surgeon: Inda Castle, MD;  Location: [facility] ENDOSCOPY;  Service: Endoscopy;  Laterality: N/A;  . LEFT HEART CATHETERIZATION WITH CORONARY ANGIOGRAM N/A 04/23/2012   Procedure: LEFT HEART CATHETERIZATION WITH CORONARY ANGIOGRAM;  Surgeon: Larey Dresser, MD;  Location: [facility] CATH LAB;  Service: Cardiovascular;   Laterality: N/A;  . TONSILLECTOMY    . TUBAL LIGATION      reports that she has never smoked. She has never used smokeless tobacco. She reports that she does not drink alcohol and does not use drugs. family history includes Colon cancer in her brother; Prostate cancer in her brother; Sarcoidosis in her sister and another family member; Stomach cancer in her mother. Allergies  Allergen Reactions  . Doxycycline Diarrhea    Possible diarrhea  . Biaxin [Clarithromycin] Other (See Comments)    diarrhea  . Metronidazole Other (See Comments)    Pt had difficulty swallowing this and says it was "horrible" to take. No allergic reaction.   Current Outpatient Medications on File Prior to Visit  Medication Sig Dispense Refill  . acetaminophen (TYLENOL) 500 MG tablet Take 2 tablets (1,000 mg total) by mouth 2 (two) times daily. 60 tablet 11  . amiodarone (PACERONE) 200 MG tablet Take by mouth.    [name] atorvastatin (LIPITOR) 20 MG tablet TAKE 1 TABLET BY MOUTH EVERY DAY 90 tablet 1  . metoprolol succinate (TOPROL-XL) 50 MG 24 hr tablet Take 1.5 tablets (75 mg total) by mouth daily. Take with or immediately following a meal. 135 tablet 3  . Multiple Vitamin (MULTIVITAMIN WITH MINERALS) TABS tablet Take 1 tablet by mouth daily. Centrum Silver    . nitroGLYCERIN (NITROSTAT) 0.4 MG SL tablet Place 1 tablet (0.4 mg total) under the tongue every 5 (five) minutes as needed for chest pain (maximum of 3 tablets). 25 tablet 7   No current facility-administered medications on file prior to visit.        ROS:  All others reviewed and negative.  Objective        PE:  BP 140/90 (BP Location: Left Arm, Patient Position: Sitting, Cuff Size: Large)   Pulse 83   Temp 97.7 F (36.5 C) (Oral)   Ht 5\' 4"  (1.626 m)   Wt 114 lb (51.7 kg)   SpO2 99%   BMI 19.57 kg/m                 Constitutional: Pt appears in NAD               HENT: Head: NCAT.                Right Ear: External ear normal.                 Left  Ear: External ear normal.                Eyes: . Pupils are equal, round, and reactive to light. Conjunctivae and EOM are normal               Nose: without d/c or deformity               Neck:  Neck supple. Gross normal ROM               Cardiovascular: Normal rate and regular rhythm.                 Pulmonary/Chest: Effort normal and breath sounds without rales or wheezing.                Abd:  Soft, NT, ND, + BS, no organomegaly               Neurological: Pt is alert. At baseline orientation, motor grossly intact               Skin: Skin is warm. No rashes, no other new lesions, LE edema - none               Psychiatric: Pt behavior is normal without agitation   Assessment/Plan:  Avey B. Mudry is a 86 y.o. Black or African American [2] female with  has a past medical history of ANEMIA-IRON DEFICIENCY, Atrial fibrillation ([place]), Blood transfusion, CAD (coronary artery disease), Cataract, COLONIC POLYPS, HX OF (03/09/2007), COPD (03/09/2007), GERD (gastroesophageal reflux disease), GI bleed, echocardiogram, HYPERGLYCEMIA (07/02/2007), Hyperlipidemia, Hypertension (11/12/2006), LEUKOPENIA, MILD (05/30/2008), Mitral valve prolapse, NSTEMI (non-ST elevated myocardial infarction) ([place]) (03/2012), OSTEOARTHRITIS (02/19/2009), OSTEOPOROSIS (11/12/2006), Positive H. pylori test (10/1996), PSVT, Spinal stenosis of lumbar region (07/16/2012), and Transfusion history.   Assessment Plan  See problem oriented assessment and plan Labs reviewed for each problem: Lab Results  Component Value Date   WBC 6.0 12/09/2019   HGB 13.5 12/09/2019   HCT 40.1 12/09/2019   PLT 258 12/09/2019   GLUCOSE 101 (H) 12/13/2019   CHOL 170 09/16/2019   TRIG 125.0 09/16/2019   HDL 69.70 09/16/2019   LDLDIRECT 106.0 03/18/2019   LDLCALC 75 09/16/2019   ALT 18 12/13/2019   AST 17 12/13/2019   NA 137 12/13/2019   K 5.1 12/13/2019   CL 98 12/13/2019   CREATININE 1.12 (H) 12/13/2019   BUN 18 12/13/2019   CO2 23 12/13/2019    TSH 1.630 12/13/2019   INR 1.06 01/20/2016   HGBA1C 5.9 09/16/2019    Micro: none  Cardiac tracings I have personally interpreted today:  none  Pertinent Radiological findings (summarize): none   I spent total 34 minutes in caring for the patient for this visit:  1) by communicating with the patient  during the visit  2) by review of pertinent vital sign data, physical examination and labs as documented in the assessment and plan  3) by review of pertinent imaging - none today  4) by review of pertinent procedures - none today  5) by obtaining and reviewing separately obtained information from family/caretaker and Care Everywhere - none today  6) by ordering medications  7) by ordering tests  8) by documenting all of this clinical information in the EHR including the management of each problem noted today in assessment and plan   There are no preventive care reminders to display for this patient.  There are no preventive care reminders to display for this patient.  Lab Results  Component Value Date   TSH 1.630 12/13/2019   Lab Results  Component Value Date   WBC 6.0 12/09/2019   HGB 13.5 12/09/2019   HCT 40.1 12/09/2019   MCV 100.0 12/09/2019   PLT 258 12/09/2019   Lab Results  Component Value Date   NA 137 12/13/2019   K 5.1 12/13/2019   CO2 23 12/13/2019   GLUCOSE  101 (H) 12/13/2019   BUN 18 12/13/2019   CREATININE 1.12 (H) 12/13/2019   BILITOT 0.2 12/13/2019   ALKPHOS 65 12/13/2019   AST 17 12/13/2019   ALT 18 12/13/2019   PROT 7.2 12/13/2019   ALBUMIN 4.7 (H) 12/13/2019   CALCIUM 9.6 12/13/2019   ANIONGAP 11 12/10/2019   GFR 59.09 (L) 09/16/2019   Lab Results  Component Value Date   CHOL 170 09/16/2019   Lab Results  Component Value Date   HDL 69.70 09/16/2019   Lab Results  Component Value Date   LDLCALC 75 09/16/2019   Lab Results  Component Value Date   TRIG 125.0 09/16/2019   Lab Results  Component Value Date   CHOLHDL 2  09/16/2019   Lab Results  Component Value Date   HGBA1C 5.9 09/16/2019      Assessment & Plan:   Problem List Items Addressed This Visit      High   AF (paroxysmal atrial fibrillation) ([place])    Vs psvt, declines amiodarone or card f/u for now      Relevant Medications   amiodarone (PACERONE) 200 MG tablet     Medium   Hyperglycemia    stable overall by history and exam, recent data reviewed with pt, and pt to continue medical treatment as before,  to f/u any worsening symptoms or concerns Lab Results  Component Value Date   HGBA1C 5.9 09/16/2019        Essential hypertension - Primary    BP Readings from Last 3 Encounters:  03/19/20 140/90  01/25/20 122/80  12/13/19 (!) 154/92  stable overall by history and exam, recent data reviewed with pt, and pt to continue medical treatment as before,  to f/u any worsening symptoms or concerns       Relevant Medications   amiodarone (PACERONE) 200 MG tablet     Low   Osteoarthritis, hand    Ok for otc voltaren gel prn,  to f/u any worsening symptoms or concerns         No orders of the defined types were placed in this encounter.   Follow-up: Return in about 6 months (around 09/17/2020).   Cathlean Cower, MD 03/28/2020 6:26 PM [facility]

## 2020-03-28 NOTE — Assessment & Plan Note (Signed)
Vs psvt, declines amiodarone or card f/u for now

## 2020-03-28 NOTE — Assessment & Plan Note (Signed)
stable overall by history and exam, recent data reviewed with pt, and pt to continue medical treatment as before,  to f/u any worsening symptoms or concerns Lab Results  Component Value Date   HGBA1C 5.9 09/16/2019

## 2020-03-28 NOTE — Assessment & Plan Note (Signed)
BP Readings from Last 3 Encounters:  03/19/20 140/90  01/25/20 122/80  12/13/19 (!) 154/92  stable overall by history and exam, recent data reviewed with pt, and pt to continue medical treatment as before,  to f/u any worsening symptoms or concerns

## 2020-04-04 ENCOUNTER — Telehealth: Payer: Self-pay | Admitting: Internal Medicine

## 2020-04-04 NOTE — Telephone Encounter (Signed)
Patient wondering if Dr. Jenny Reichmann can send her in something for diarrhea, patient denies virtual appointment at this time because she is not having any other symptoms.  [phone] [facility] [place], [place] Phone:  [phone]  Fax:  [phone]

## 2020-04-05 NOTE — Telephone Encounter (Signed)
We normally dont send lomotil for this first off, unless we know there is no fever, blood or worsening pain, which the lomotil can make worse, o/w ok to try the otc immodium prn

## 2020-04-05 NOTE — Telephone Encounter (Signed)
Sent to Dr. John to advise. 

## 2020-04-05 NOTE — Telephone Encounter (Signed)
Patient calling back asking what she can do, she is doing the [employer] but it hasn't helped, this is the fourth day it has been happening

## 2020-04-06 ENCOUNTER — Telehealth: Payer: Self-pay | Admitting: Cardiovascular Disease

## 2020-04-06 NOTE — Telephone Encounter (Signed)
The patient states she has had diarrhea for 5 days. She just bought Imodium today. Instructed her to take the Imodium and to call PCP for further recommendations. She was grateful for call and agrees with plan.

## 2020-04-06 NOTE — Telephone Encounter (Signed)
Patient states she has had diarrhea for the past 5 days and she would like to know if Dr. Burt Knack has any recommendations for medication. Please call.

## 2020-04-10 NOTE — Telephone Encounter (Signed)
Pt has made f/u appt for tomorrow 04/11/20.Marland KitchenJohny Chess

## 2020-04-11 ENCOUNTER — Telehealth (INDEPENDENT_AMBULATORY_CARE_PROVIDER_SITE_OTHER): Payer: Medicare Other | Admitting: Internal Medicine

## 2020-04-11 DIAGNOSIS — R739 Hyperglycemia, unspecified: Secondary | ICD-10-CM

## 2020-04-11 DIAGNOSIS — R197 Diarrhea, unspecified: Secondary | ICD-10-CM

## 2020-04-11 DIAGNOSIS — F411 Generalized anxiety disorder: Secondary | ICD-10-CM | POA: Diagnosis not present

## 2020-04-11 NOTE — Progress Notes (Signed)
Patient ID: Lindsay Doyle, female   DOB: 1933-08-03, 87 y.o.   MRN: [id number]  Virtual Visit via Video Note  I connected with Lindsay Doyle on 04/15/20 at  1:20 PM EST by a video enabled telemedicine application and verified that I am speaking with the correct person using two identifiers.  Location of all participants today Patient: at home Provider: at office   I discussed the limitations of evaluation and management by telemedicine and the availability of in person appointments. The patient expressed understanding and agreed to proceed.  History of Present Illness: Here with c/o recent onset diarrheal illness onset jan 6, mild, watery, intermittent, none on jan 8 for some reason but other days has been improved with immodium prn.  Denies fever, abd pain, chills, n/v, blood.  Denies worsening reflux, dysphagia.  Pt denies chest pain, increased sob or doe, wheezing, orthopnea, PND, increased LE swelling, palpitations, dizziness or syncope.   Pt denies polydipsia, polyuria, No other new complaints  Denies worsening depressive symptoms, suicidal ideation, or panic; has ongoing anxiety Past Medical History:  Diagnosis Date  . ANEMIA-IRON DEFICIENCY    presumed GI bleed 08/2012- anticoag stopped  . Atrial fibrillation ([place])    a. Apixaban started 03/2012 - stopped after admx for GI bleed 6/14  . Blood transfusion    a. 05/2010: 3 transfusions for anemia/GIB.  [name] CAD (coronary artery disease)    a. LHC 1/14:  mLAD serial 30 and 60%, oRCA 30%, EF 60%, Afib,   . Cataract   . COLONIC POLYPS, HX OF 03/09/2007  . COPD 03/09/2007   ? pt is unsure  . GERD (gastroesophageal reflux disease)   . GI bleed    thought to be due to AVMs  . Hx of echocardiogram    a. echo 2/11:  EF 55-60%, mild AI, mild RAE, mild to mod TR;   b.  a. Echo 2/14:  Mild focal basal and mild LVH, EF 60-65%, mild AI, mild RAE, trivia effusion  . HYPERGLYCEMIA 07/02/2007  . Hyperlipidemia   . Hypertension 11/12/2006  .  LEUKOPENIA, MILD 05/30/2008  . Mitral valve prolapse    Remotely - last echo 2011 did not show this  . NSTEMI (non-ST elevated myocardial infarction) ([place]) 03/2012   a. Type II in setting of AF with RVR 03/2012  . OSTEOARTHRITIS 02/19/2009   Spinal  . OSTEOPOROSIS 11/12/2006  . Positive H. pylori test 10/1996  . PSVT    a. Remotely, in setting of anemia.  [name] Spinal stenosis of lumbar region 07/16/2012  . Transfusion history    7/15    Past Surgical History:  Procedure Laterality Date  . ABDOMINAL HYSTERECTOMY    . CARDIAC CATHETERIZATION  1992   S/P in 1992 at [facility] in [place]. This was negative for any coronary artery disease  . carotid duplex  08/29/2003  . CATARACT EXTRACTION    . ceasarean    . ENTEROSCOPY N/A 10/10/2013   Procedure: ENTEROSCOPY;  Surgeon: Inda Castle, MD;  Location: [facility] ENDOSCOPY;  Service: Endoscopy;  Laterality: N/A;  . ENTEROSCOPY N/A 12/28/2014   Procedure: ENTEROSCOPY;  Surgeon: Inda Castle, MD;  Location: [facility] ENDOSCOPY;  Service: Endoscopy;  Laterality: N/A;  . ENTEROSCOPY N/A 01/20/2016   Procedure: ENTEROSCOPY;  Surgeon: Manus Gunning, MD;  Location[facility] ENDOSCOPY;  Service: Gastroenterology;  Laterality: N/A;  . HOT HEMOSTASIS N/A 12/28/2014   Procedure: HOT HEMOSTASIS (ARGON PLASMA COAGULATION/BICAP);  Surgeon: Inda Castle,  MD;  Location: [facility] ENDOSCOPY;  Service: Endoscopy;  Laterality: N/A;  . LEFT HEART CATHETERIZATION WITH CORONARY ANGIOGRAM N/A 04/23/2012   Procedure: LEFT HEART CATHETERIZATION WITH CORONARY ANGIOGRAM;  Surgeon: Larey Dresser, MD;  Location: [facility] CATH LAB;  Service: Cardiovascular;  Laterality: N/A;  . TONSILLECTOMY    . TUBAL LIGATION      reports that she has never smoked. She has never used smokeless tobacco. She reports that she does not drink alcohol and does not use drugs. family history includes Colon cancer in her brother; Prostate cancer in her brother; Sarcoidosis in her sister and  another family member; Stomach cancer in her mother. Allergies  Allergen Reactions  . Doxycycline Diarrhea    Possible diarrhea  . Biaxin [Clarithromycin] Other (See Comments)    diarrhea  . Metronidazole Other (See Comments)    Pt had difficulty swallowing this and says it was "horrible" to take. No allergic reaction.   Current Outpatient Medications on File Prior to Visit  Medication Sig Dispense Refill  . acetaminophen (TYLENOL) 500 MG tablet Take 2 tablets (1,000 mg total) by mouth 2 (two) times daily. 60 tablet 11  . amiodarone (PACERONE) 200 MG tablet Take by mouth.    [name] atorvastatin (LIPITOR) 20 MG tablet TAKE 1 TABLET BY MOUTH EVERY DAY 90 tablet 1  . ferrous sulfate 325 (65 FE) MG tablet TAKE 1 TABLET BY MOUTH 2 TIMES DAILY WITH A MEAL. 180 tablet 0  . metoprolol succinate (TOPROL-XL) 50 MG 24 hr tablet Take 1.5 tablets (75 mg total) by mouth daily. Take with or immediately following a meal. 135 tablet 3  . Multiple Vitamin (MULTIVITAMIN WITH MINERALS) TABS tablet Take 1 tablet by mouth daily. Centrum Silver    . nitroGLYCERIN (NITROSTAT) 0.4 MG SL tablet Place 1 tablet (0.4 mg total) under the tongue every 5 (five) minutes as needed for chest pain (maximum of 3 tablets). 25 tablet 7   No current facility-administered medications on file prior to visit.    Observations/Objective: Alert, NAD, appropriate mood and affect, resps normal, cn 2-12 intact, moves all 4s, no visible rash or swelling Lab Results  Component Value Date   WBC 6.0 12/09/2019   HGB 13.5 12/09/2019   HCT 40.1 12/09/2019   PLT 258 12/09/2019   GLUCOSE 101 (H) 12/13/2019   CHOL 170 09/16/2019   TRIG 125.0 09/16/2019   HDL 69.70 09/16/2019   LDLDIRECT 106.0 03/18/2019   LDLCALC 75 09/16/2019   ALT 18 12/13/2019   AST 17 12/13/2019   NA 137 12/13/2019   K 5.1 12/13/2019   CL 98 12/13/2019   CREATININE 1.12 (H) 12/13/2019   BUN 18 12/13/2019   CO2 23 12/13/2019   TSH 1.630 12/13/2019   INR 1.06  01/20/2016   HGBA1C 5.9 09/16/2019   Assessment and Plan: See notes  Follow Up Instructions: See notes   I discussed the assessment and treatment plan with the patient. The patient was provided an opportunity to ask questions and all were answered. The patient agreed with the plan and demonstrated an understanding of the instructions.   The patient was advised to call back or seek an in-person evaluation if the symptoms worsen or if the condition fails to improve as anticipated.   Cathlean Cower, MD

## 2020-04-13 ENCOUNTER — Other Ambulatory Visit: Payer: Self-pay | Admitting: Physician Assistant

## 2020-04-15 ENCOUNTER — Encounter: Payer: Self-pay | Admitting: Internal Medicine

## 2020-04-15 NOTE — Assessment & Plan Note (Signed)
Overall stable, declines further med tx or referral    Current Outpatient Medications (Cardiovascular):  .  amiodarone (PACERONE) 200 MG tablet, Take by mouth. [name]  atorvastatin (LIPITOR) 20 MG tablet, TAKE 1 TABLET BY MOUTH EVERY DAY .  metoprolol succinate (TOPROL-XL) 50 MG 24 hr tablet, Take 1.5 tablets (75 mg total) by mouth daily. Take with or immediately following a meal. .  nitroGLYCERIN (NITROSTAT) 0.4 MG SL tablet, Place 1 tablet (0.4 mg total) under the tongue every 5 (five) minutes as needed for chest pain (maximum of 3 tablets).   Current Outpatient Medications (Analgesics):  .  acetaminophen (TYLENOL) 500 MG tablet, Take 2 tablets (1,000 mg total) by mouth 2 (two) times daily.  Current Outpatient Medications (Hematological):  .  ferrous sulfate 325 (65 FE) MG tablet, TAKE 1 TABLET BY MOUTH 2 TIMES DAILY WITH A MEAL.  Current Outpatient Medications (Other):  [name]  Multiple Vitamin (MULTIVITAMIN WITH MINERALS) TABS tablet, Take 1 tablet by mouth daily. Centrum Silver

## 2020-04-15 NOTE — Assessment & Plan Note (Signed)
Lab Results  Component Value Date   HGBA1C 5.9 09/16/2019   Stable, pt to continue current medical treatment  - diet

## 2020-04-15 NOTE — Assessment & Plan Note (Signed)
S/p recent onset, similar to bouts in past - suspect IBS as no fever, pain, blood and intermittent; ok for immodium prn as has been working well, declines further GI eval for now or lab eval; ok to add probiotic such as OTC align,  to f/u any worsening symptoms or concerns, consider GI for any worsening

## 2020-04-15 NOTE — Patient Instructions (Signed)
Please continue all other medications as before, including the immodium as needed  Please have the pharmacy call with any other refills you may need.  Please continue your efforts at being more active, low cholesterol diet, and weight control.  Please keep your appointments with your specialists as you may have planned

## 2020-04-19 ENCOUNTER — Telehealth: Payer: Self-pay | Admitting: Internal Medicine

## 2020-04-19 DIAGNOSIS — R197 Diarrhea, unspecified: Secondary | ICD-10-CM

## 2020-04-19 NOTE — Telephone Encounter (Signed)
Can she say the reason

## 2020-04-19 NOTE — Telephone Encounter (Signed)
Patient called and was requesting a referral be placed for GI. Please call the patient if the referral is placed, she can be reached at [phone]. Last OV 04/11/20.

## 2020-04-20 ENCOUNTER — Telehealth: Payer: Self-pay | Admitting: Gastroenterology

## 2020-04-20 NOTE — Telephone Encounter (Signed)
Spoke with patient, advised that she may continue taking Imodium until her appt on 04/24/20. Patient verbalized understanding and had no further concerns at the end of the call.

## 2020-04-20 NOTE — Telephone Encounter (Signed)
Ok referral done 

## 2020-04-20 NOTE — Telephone Encounter (Signed)
Patient has been having diarrhea for the pst 20 days, states nothing is helping and she doesn't know what to do.

## 2020-04-20 NOTE — Telephone Encounter (Signed)
[facility] FYI   Caller has had diarrhea since 04/07/19 taking Imodium as directed and got better and actually went one day without it.  [facility] advised: See HCP or PCP within 4 Hours  Patient understood and reached out to her gastrologist to see about getting an appointment.

## 2020-04-24 ENCOUNTER — Ambulatory Visit (INDEPENDENT_AMBULATORY_CARE_PROVIDER_SITE_OTHER): Payer: Medicare Other | Admitting: Gastroenterology

## 2020-04-24 ENCOUNTER — Ambulatory Visit: Payer: Medicare Other | Admitting: Cardiology

## 2020-04-24 ENCOUNTER — Other Ambulatory Visit (INDEPENDENT_AMBULATORY_CARE_PROVIDER_SITE_OTHER): Payer: Medicare Other

## 2020-04-24 ENCOUNTER — Encounter: Payer: Self-pay | Admitting: Gastroenterology

## 2020-04-24 VITALS — BP 130/78 | HR 64 | Ht 64.0 in | Wt 113.4 lb

## 2020-04-24 DIAGNOSIS — D649 Anemia, unspecified: Secondary | ICD-10-CM | POA: Diagnosis not present

## 2020-04-24 DIAGNOSIS — R197 Diarrhea, unspecified: Secondary | ICD-10-CM

## 2020-04-24 LAB — CBC WITH DIFFERENTIAL/PLATELET
Basophils Absolute: 0 10*3/uL (ref 0.0–0.1)
Basophils Relative: 0.1 % (ref 0.0–3.0)
Eosinophils Absolute: 0.1 10*3/uL (ref 0.0–0.7)
Eosinophils Relative: 1.1 % (ref 0.0–5.0)
HCT: 38.7 % (ref 36.0–46.0)
Hemoglobin: 13.1 g/dL (ref 12.0–15.0)
Lymphocytes Relative: 24.1 % (ref 12.0–46.0)
Lymphs Abs: 1.7 10*3/uL (ref 0.7–4.0)
MCHC: 33.8 g/dL (ref 30.0–36.0)
MCV: 100.5 fl — ABNORMAL HIGH (ref 78.0–100.0)
Monocytes Absolute: 0.6 10*3/uL (ref 0.1–1.0)
Monocytes Relative: 8.6 % (ref 3.0–12.0)
Neutro Abs: 4.7 10*3/uL (ref 1.4–7.7)
Neutrophils Relative %: 66.1 % (ref 43.0–77.0)
Platelets: 268 10*3/uL (ref 150.0–400.0)
RBC: 3.85 Mil/uL — ABNORMAL LOW (ref 3.87–5.11)
RDW: 13.5 % (ref 11.5–15.5)
WBC: 7 10*3/uL (ref 4.0–10.5)

## 2020-04-24 NOTE — Progress Notes (Signed)
HPI :  87 year old female here for a follow-up visit for change in bowel habits.  I know her for history of anemia and GI bleeding.  She has had a history of overt GI bleeding due to small bowel AVMs.  She has had enteroscopy with me in 2017 with treatment of small bowel AVMs.  She had a small bowel enteroscopy in 2016 with Dr. Deatra Ina, she had a gastric AVM ablated with APC on that procedure. She has had iron deficiency dating back to 2012 at which time she had a normal colonoscopy, EGD showing gastritis, and capsule endoscopy without any significant findings.  Her hemoglobin has been stable over time on supplemental iron.  Generally she has been in her usual state of health until about 4 weeks ago she started developing loose and watery stools.  She is having a bowel movement about once per day, similar in frequency to previous however very loose and watery and she is afraid to leave the house due to the symptoms.  Can come on unpredictably.  She does not have any cramping or abdominal pain.  No nocturnal symptoms.  No blood in her stools.  She denies any new medications around the time of her symptoms.  She has not been using any antibiotics.  No sick contacts or hospitalizations.  She has been wearing depends to prevent accidents although has not had any.  No nausea or vomiting.  No abdominal pains.  She took Imodium once a day for a few weeks and it did not make much of a big difference so far.  For several days this did not really slow down.  She had 1 day where she had a formed stool and stopped taking Imodium and symptoms have essentially persisted.  She is been eating a bland diet which has not really helped much.  She states she feels "lazy".  She denies any NSAID use, using only Tylenol.   Past Medical History:  Diagnosis Date  . ANEMIA-IRON DEFICIENCY    presumed GI bleed 08/2012- anticoag stopped  . Atrial fibrillation ([place])    a. Apixaban started 03/2012 - stopped after admx for GI bleed  6/14  . Blood transfusion    a. 05/2010: 3 transfusions for anemia/GIB.  [name] CAD (coronary artery disease)    a. LHC 1/14:  mLAD serial 30 and 60%, oRCA 30%, EF 60%, Afib,   . Cataract   . COLONIC POLYPS, HX OF 03/09/2007  . COPD 03/09/2007   ? pt is unsure  . GERD (gastroesophageal reflux disease)   . GI bleed    thought to be due to AVMs  . Hx of echocardiogram    a. echo 2/11:  EF 55-60%, mild AI, mild RAE, mild to mod TR;   b.  a. Echo 2/14:  Mild focal basal and mild LVH, EF 60-65%, mild AI, mild RAE, trivia effusion  . HYPERGLYCEMIA 07/02/2007  . Hyperlipidemia   . Hypertension 11/12/2006  . LEUKOPENIA, MILD 05/30/2008  . Mitral valve prolapse    Remotely - last echo 2011 did not show this  . NSTEMI (non-ST elevated myocardial infarction) ([place]) 03/2012   a. Type II in setting of AF with RVR 03/2012  . OSTEOARTHRITIS 02/19/2009   Spinal  . OSTEOPOROSIS 11/12/2006  . Positive H. pylori test 10/1996  . PSVT    a. Remotely, in setting of anemia.  [name] Spinal stenosis of lumbar region 07/16/2012  . Transfusion history    7/15  Past Surgical History:  Procedure Laterality Date  . ABDOMINAL HYSTERECTOMY    . CARDIAC CATHETERIZATION  1992   S/P in 1992 at [facility] in [place]. This was negative for any coronary artery disease  . carotid duplex  08/29/2003  . CATARACT EXTRACTION    . ceasarean    . ENTEROSCOPY N/A 10/10/2013   Procedure: ENTEROSCOPY;  Surgeon: Inda Castle, MD;  Location: [facility] ENDOSCOPY;  Service: Endoscopy;  Laterality: N/A;  . ENTEROSCOPY N/A 12/28/2014   Procedure: ENTEROSCOPY;  Surgeon: Inda Castle, MD;  Location: [facility] ENDOSCOPY;  Service: Endoscopy;  Laterality: N/A;  . ENTEROSCOPY N/A 01/20/2016   Procedure: ENTEROSCOPY;  Surgeon: Manus Gunning, MD;  Location: [facility] ENDOSCOPY;  Service: Gastroenterology;  Laterality: N/A;  . HOT HEMOSTASIS N/A 12/28/2014   Procedure: HOT HEMOSTASIS (ARGON PLASMA COAGULATION/BICAP);  Surgeon: Inda Castle, MD;  Location: [facility] ENDOSCOPY;  Service: Endoscopy;  Laterality: N/A;  . LEFT HEART CATHETERIZATION WITH CORONARY ANGIOGRAM N/A 04/23/2012   Procedure: LEFT HEART CATHETERIZATION WITH CORONARY ANGIOGRAM;  Surgeon: Larey Dresser, MD;  Location: [facility] CATH LAB;  Service: Cardiovascular;  Laterality: N/A;  . TONSILLECTOMY    . TUBAL LIGATION     Family History  Problem Relation Age of Onset  . Prostate cancer Brother   . Stomach cancer Mother   . Sarcoidosis Sister   . Colon cancer Brother   . Sarcoidosis Other   . CAD Neg Hx    Social History   Tobacco Use  . Smoking status: Never Smoker  . Smokeless tobacco: Never Used  Vaping Use  . Vaping Use: Never used  Substance Use Topics  . Alcohol use: No  . Drug use: No   Current Outpatient Medications  Medication Sig Dispense Refill  . acetaminophen (TYLENOL) 500 MG tablet Take 2 tablets (1,000 mg total) by mouth 2 (two) times daily. 60 tablet 11  . atorvastatin (LIPITOR) 20 MG tablet TAKE 1 TABLET BY MOUTH EVERY DAY 90 tablet 1  . ferrous sulfate 325 (65 FE) MG tablet TAKE 1 TABLET BY MOUTH 2 TIMES DAILY WITH A MEAL. 180 tablet 0  . metoprolol succinate (TOPROL-XL) 50 MG 24 hr tablet Take 1.5 tablets (75 mg total) by mouth daily. Take with or immediately following a meal. 135 tablet 3  . Multiple Vitamin (MULTIVITAMIN WITH MINERALS) TABS tablet Take 1 tablet by mouth daily. Centrum Silver    . nitroGLYCERIN (NITROSTAT) 0.4 MG SL tablet Place 1 tablet (0.4 mg total) under the tongue every 5 (five) minutes as needed for chest pain (maximum of 3 tablets). 25 tablet 7   No current facility-administered medications for this visit.   Allergies  Allergen Reactions  . Doxycycline Diarrhea    Possible diarrhea  . Biaxin [Clarithromycin] Other (See Comments)    diarrhea  . Metronidazole Other (See Comments)    Pt had difficulty swallowing this and says it was "horrible" to take. No allergic reaction.     Review of Systems: All  systems reviewed and negative except where noted in HPI.   Labs reviewed in Epic  Physical Exam: BP 130/78   Pulse 64   Ht 5\' 4"  (1.626 m)   Wt 113 lb 6.4 oz (51.4 kg)   BMI 19.47 kg/m  Constitutional: Pleasant,well-developed, female in no acute distress. Abdominal: Soft, nondistended, nontender. . There are no masses palpable.  Extremities: no edema Lymphadenopathy: No cervical adenopathy noted. Neurological: Alert and oriented to person place and time.  Skin: Skin is warm and dry. No rashes noted. Psychiatric: Normal mood and affect. Behavior is normal.   ASSESSMENT AND PLAN: 87 year old female here for reassessment of the following:  Acute diarrhea - as above, acute change in bowel habits without clear trigger for this as we can identify so far.  Essentially loose watery stool with same frequency as before but this is interfering with her quality of life and she is afraid to leave the house.  Use of regular Imodium at least just once daily has not provided significant benefit.  Discussed differential diagnosis with her.  We will send stool studies to rule out infection and check fecal lactoferrin initially.  I will also check CBC to ensure no leukocytosis and to recheck her anemia to ensure hemoglobin stable in light of her history of anemia.  Want to get the stool studies back we will let her know further recommendations on how to manage this.  If she has fever/worsening etc. in the interim she should let me know.  All questions answered she agreed  Anemia - no recurrent overt GI bleeding, previously due to AVMs which were treated endoscopically.  Continue iron, will recheck CBC today.  Bellaire Cellar, MD [facility]

## 2020-04-24 NOTE — Patient Instructions (Addendum)
If you are age 85 or older, your body mass index should be between 23-30. Your Body mass index is 19.47 kg/m. If this is out of the aforementioned range listed, please consider follow up with your Primary Care Provider.  If you are age 11 or younger, your body mass index should be between 19-25. Your Body mass index is 19.47 kg/m. If this is out of the aformentioned range listed, please consider follow up with your Primary Care Provider.   Please go to the lab in the basement of our building to have lab work done as you leave today. Hit "B" for basement when you get on the elevator.  When the doors open the lab is on your left.  We will call you with the results. Thank you.   Once you submit the stool sample you can resume Imodium.   Thank you for entrusting me with your care and for choosing [facility], Dr. [place] Cellar

## 2020-04-25 DIAGNOSIS — A09 Infectious gastroenteritis and colitis, unspecified: Secondary | ICD-10-CM | POA: Diagnosis not present

## 2020-04-25 DIAGNOSIS — R197 Diarrhea, unspecified: Secondary | ICD-10-CM | POA: Diagnosis not present

## 2020-04-25 DIAGNOSIS — D649 Anemia, unspecified: Secondary | ICD-10-CM | POA: Diagnosis not present

## 2020-04-26 ENCOUNTER — Telehealth: Payer: Self-pay | Admitting: Gastroenterology

## 2020-04-26 NOTE — Telephone Encounter (Signed)
Spoke with patient, she wanted her stool study results. Advised that the stool studies are sent to an outside lab and take several days to result. Advised patient that we will give her a call with her results and she may also receive a message via My Chart. Patient requested that if we call to call her cell phone number. Patient verbalized understanding of all information and had no concerns at the end of the call.

## 2020-04-26 NOTE — Telephone Encounter (Signed)
Lm on vm for patient to return call 

## 2020-04-26 NOTE — Telephone Encounter (Signed)
Calling requesting lab results

## 2020-04-26 NOTE — Telephone Encounter (Signed)
Inbound call from patient returning your call. 

## 2020-04-27 LAB — FECAL LACTOFERRIN, QUANT
Fecal Lactoferrin: POSITIVE — AB
MICRO NUMBER:: 11459876
SPECIMEN QUALITY:: ADEQUATE

## 2020-04-30 LAB — GI PROFILE, STOOL, PCR

## 2020-05-08 ENCOUNTER — Ambulatory Visit: Payer: Medicare Other | Admitting: Cardiology

## 2020-05-29 ENCOUNTER — Ambulatory Visit (INDEPENDENT_AMBULATORY_CARE_PROVIDER_SITE_OTHER): Payer: Medicare Other | Admitting: Cardiology

## 2020-05-29 ENCOUNTER — Encounter: Payer: Self-pay | Admitting: Cardiology

## 2020-05-29 ENCOUNTER — Other Ambulatory Visit: Payer: Self-pay

## 2020-05-29 VITALS — BP 116/68 | HR 81 | Ht 64.0 in | Wt 114.0 lb

## 2020-05-29 DIAGNOSIS — I480 Paroxysmal atrial fibrillation: Secondary | ICD-10-CM | POA: Diagnosis not present

## 2020-05-29 DIAGNOSIS — Z8719 Personal history of other diseases of the digestive system: Secondary | ICD-10-CM

## 2020-05-29 NOTE — Progress Notes (Signed)
Electrophysiology Office Follow up Visit Note:    Date:  05/29/2020   ID:  Lindsay Doyle, Lindsay Doyle 09-04-1933, MRN [id number]  PCP:  Biagio Borg, MD  [facility] Cardiologist:  Sherren Mocha, MD  [facility] Electrophysiologist:  Vickie Epley, MD    Interval History:    Lindsay Doyle is a 87 y.o. female who presents for a follow up visit for her pAF. I last saw the patient on 01/25/2020. Since her last appointment she is doing well off amiodarone. She continues to decline anticoagulation given her remote history of GI bleeding. No episodes of arrhythmia since her last appointment.    Past Medical History:  Diagnosis Date  . ANEMIA-IRON DEFICIENCY    presumed GI bleed 08/2012- anticoag stopped  . Atrial fibrillation ([place])    a. Apixaban started 03/2012 - stopped after admx for GI bleed 6/14  . Blood transfusion    a. 05/2010: 3 transfusions for anemia/GIB.  [name] CAD (coronary artery disease)    a. LHC 1/14:  mLAD serial 30 and 60%, oRCA 30%, EF 60%, Afib,   . Cataract   . COLONIC POLYPS, HX OF 03/09/2007  . COPD 03/09/2007   ? pt is unsure  . GERD (gastroesophageal reflux disease)   . GI bleed    thought to be due to AVMs  . Hx of echocardiogram    a. echo 2/11:  EF 55-60%, mild AI, mild RAE, mild to mod TR;   b.  a. Echo 2/14:  Mild focal basal and mild LVH, EF 60-65%, mild AI, mild RAE, trivia effusion  . HYPERGLYCEMIA 07/02/2007  . Hyperlipidemia   . Hypertension 11/12/2006  . LEUKOPENIA, MILD 05/30/2008  . Mitral valve prolapse    Remotely - last echo 2011 did not show this  . NSTEMI (non-ST elevated myocardial infarction) ([place]) 03/2012   a. Type II in setting of AF with RVR 03/2012  . OSTEOARTHRITIS 02/19/2009   Spinal  . OSTEOPOROSIS 11/12/2006  . Positive H. pylori test 10/1996  . PSVT    a. Remotely, in setting of anemia.  [name] Spinal stenosis of lumbar region 07/16/2012  . Transfusion history    7/15     Past Surgical History:  Procedure Laterality Date  .  ABDOMINAL HYSTERECTOMY    . CARDIAC CATHETERIZATION  1992   S/P in 1992 at [facility] in [place]. This was negative for any coronary artery disease  . carotid duplex  08/29/2003  . CATARACT EXTRACTION    . ceasarean    . ENTEROSCOPY N/A 10/10/2013   Procedure: ENTEROSCOPY;  Surgeon: Inda Castle, MD;  Location: [facility] ENDOSCOPY;  Service: Endoscopy;  Laterality: N/A;  . ENTEROSCOPY N/A 12/28/2014   Procedure: ENTEROSCOPY;  Surgeon: Inda Castle, MD;  Location: [facility] ENDOSCOPY;  Service: Endoscopy;  Laterality: N/A;  . ENTEROSCOPY N/A 01/20/2016   Procedure: ENTEROSCOPY;  Surgeon: Manus Gunning, MD;  Location[facility] ENDOSCOPY;  Service: Gastroenterology;  Laterality: N/A;  . HOT HEMOSTASIS N/A 12/28/2014   Procedure: HOT HEMOSTASIS (ARGON PLASMA COAGULATION/BICAP);  Surgeon: Inda Castle, MD;  Location: [facility];  Service: Endoscopy;  Laterality: N/A;  . LEFT HEART CATHETERIZATION WITH CORONARY ANGIOGRAM N/A 04/23/2012   Procedure: LEFT HEART CATHETERIZATION WITH CORONARY ANGIOGRAM;  Surgeon: Larey Dresser, MD;  Location: [facility] CATH LAB;  Service: Cardiovascular;  Laterality: N/A;  . TONSILLECTOMY    . TUBAL LIGATION      Current Medications: Current Meds  Medication Sig  .  acetaminophen (TYLENOL) 500 MG tablet Take 2 tablets (1,000 mg total) by mouth 2 (two) times daily.  [name] atorvastatin (LIPITOR) 20 MG tablet TAKE 1 TABLET BY MOUTH EVERY DAY  . ferrous sulfate 325 (65 FE) MG tablet TAKE 1 TABLET BY MOUTH 2 TIMES DAILY WITH A MEAL.  . metoprolol succinate (TOPROL-XL) 50 MG 24 hr tablet Take 1.5 tablets (75 mg total) by mouth daily. Take with or immediately following a meal.  . Multiple Vitamin (MULTIVITAMIN WITH MINERALS) TABS tablet Take 1 tablet by mouth daily. Centrum Silver  . nitroGLYCERIN (NITROSTAT) 0.4 MG SL tablet Place 1 tablet (0.4 mg total) under the tongue every 5 (five) minutes as needed for chest pain (maximum of 3 tablets).     Allergies:    Doxycycline, Biaxin [clarithromycin], and Metronidazole   Social History   Socioeconomic History  . Marital status: Married    Spouse name: Not on file  . Number of children: 1  . Years of education: Not on file  . Highest education level: Not on file  Occupational History  . Occupation: retired    Comment: retired  Tobacco Use  . Smoking status: Never Smoker  . Smokeless tobacco: Never Used  Vaping Use  . Vaping Use: Never used  Substance and Sexual Activity  . Alcohol use: No  . Drug use: No  . Sexual activity: Never  Other Topics Concern  . Not on file  Social History Narrative  . Not on file   Social Determinants of Health   Financial Resource Strain: Not on file  Food Insecurity: Not on file  Transportation Needs: Not on file  Physical Activity: Not on file  Stress: Not on file  Social Connections: Not on file     Family History: The patient's family history includes Colon cancer in her brother; Prostate cancer in her brother; Sarcoidosis in her sister and another family member; Stomach cancer in her mother. There is no history of CAD.  ROS:   Please see the history of present illness.    All other systems reviewed and are negative.  EKGs/Labs/Other Studies Reviewed:    The following studies were reviewed today:   EKG:  The ekg ordered today demonstrates sinus rhythm.  Recent Labs: 11/26/2019: Magnesium 2.1 12/13/2019: ALT 18; BUN 18; Creatinine, Ser 1.12; Potassium 5.1; Sodium 137; TSH 1.630 04/24/2020: Hemoglobin 13.1; Platelets 268.0  Recent Lipid Panel    Component Value Date/Time   CHOL 170 09/16/2019 1451   TRIG 125.0 09/16/2019 1451   HDL 69.70 09/16/2019 1451   CHOLHDL 2 09/16/2019 1451   VLDL 25.0 09/16/2019 1451   LDLCALC 75 09/16/2019 1451   LDLDIRECT 106.0 03/18/2019 1137    Physical Exam:    VS:  BP 116/68   Pulse 81   Ht 5\' 4"  (1.626 m)   Wt 114 lb (51.7 kg)   BMI 19.57 kg/m     Wt Readings from Last 3 Encounters:  05/29/20  114 lb (51.7 kg)  04/24/20 113 lb 6.4 oz (51.4 kg)  03/19/20 114 lb (51.7 kg)     GEN:  Well nourished, well developed in no acute distress HEENT: Normal NECK: No JVD; No carotid bruits LYMPHATICS: No lymphadenopathy CARDIAC: RRR, no murmurs, rubs, gallops RESPIRATORY:  Clear to auscultation without rales, wheezing or rhonchi  ABDOMEN: Soft, non-tender, non-distended MUSCULOSKELETAL:  No edema; No deformity  SKIN: Warm and dry NEUROLOGIC:  Alert and oriented x 3 PSYCHIATRIC:  Normal affect   ASSESSMENT:    1.  AF (paroxysmal atrial fibrillation) ([place])   2. History of GI bleed    PLAN:    In order of problems listed above:  1. Paroxysmal AF Off anticoagulation given history of GI bleed. No recurrent episodes since our last appointment. She prefers to avoid [facility] and amiodarone as previously discussed.  2.hx of GI bleed No recurrent bleeding. Patient declines AC due to this history.  Plan for follow up in 1 year.   Medication Adjustments/Labs and Tests Ordered: Current medicines are reviewed at length with the patient today.  Concerns regarding medicines are outlined above.  Orders Placed This Encounter  Procedures  . EKG 12-Lead   No orders of the defined types were placed in this encounter.    Signed, Lars Mage, MD, [facility]  05/29/2020 8:06 PM    Electrophysiology [facility]

## 2020-05-29 NOTE — Patient Instructions (Signed)
Medication Instructions:  Your physician recommends that you continue on your current medications as directed. Please refer to the Current Medication list given to you today.  Labwork: None ordered.  Testing/Procedures: None ordered.  Follow-Up: Your physician wants you to follow-up in: one year with Dr. Lambert.   You will receive a reminder letter in the mail two months in advance. If you don't receive a letter, please call our office to schedule the follow-up appointment.   Any Other Special Instructions Will Be Listed Below (If Applicable).  If you need a refill on your cardiac medications before your next appointment, please call your pharmacy.   

## 2020-06-05 DIAGNOSIS — H524 Presbyopia: Secondary | ICD-10-CM | POA: Diagnosis not present

## 2020-06-05 DIAGNOSIS — H02051 Trichiasis without entropian right upper eyelid: Secondary | ICD-10-CM | POA: Diagnosis not present

## 2020-06-05 DIAGNOSIS — Z961 Presence of intraocular lens: Secondary | ICD-10-CM | POA: Diagnosis not present

## 2020-06-05 DIAGNOSIS — H02054 Trichiasis without entropian left upper eyelid: Secondary | ICD-10-CM | POA: Diagnosis not present

## 2020-06-05 DIAGNOSIS — H26491 Other secondary cataract, right eye: Secondary | ICD-10-CM | POA: Diagnosis not present

## 2020-06-06 ENCOUNTER — Encounter: Payer: Self-pay | Admitting: Cardiovascular Disease

## 2020-06-06 ENCOUNTER — Ambulatory Visit (INDEPENDENT_AMBULATORY_CARE_PROVIDER_SITE_OTHER): Payer: Medicare Other | Admitting: Cardiovascular Disease

## 2020-06-06 ENCOUNTER — Other Ambulatory Visit: Payer: Self-pay

## 2020-06-06 VITALS — BP 130/86 | HR 96 | Ht 64.0 in | Wt 112.6 lb

## 2020-06-06 DIAGNOSIS — I480 Paroxysmal atrial fibrillation: Secondary | ICD-10-CM | POA: Diagnosis not present

## 2020-06-06 MED ORDER — METOPROLOL TARTRATE 25 MG PO TABS: 25.0000 mg | ORAL_TABLET | Freq: Every day | ORAL | 3 refills | Status: DC | PRN

## 2020-06-06 NOTE — Progress Notes (Signed)
Cardiology Office Note:    Date:  06/06/2020   ID:  Lindsay Doyle, Lindsay Doyle 1933/04/18, MRN [id number]  PCP:  Biagio Borg, MD   [place]  Cardiologist:  Sherren Mocha, MD  Advanced Practice Provider:  No care team member to display Electrophysiologist:  Vickie Epley, MD       Referring MD: Biagio Borg, MD   Chief Complaint  Patient presents with  . Palpitations    History of Present Illness:    Lindsay Doyle is a 87 y.o. female with a hx of paroxysmal atrial fibrillation and heart palpitations.  The patient had atrial fibrillation in 2014 associated with elevated troponin.  Cardiac catheterization demonstrated mild nonobstructive coronary disease.  The patient has had significant GI bleeding secondary to AVMs and she is not able to tolerate anticoagulant drugs.  The patient is here alone today. She is doing ok.  She continues to have occasional heart palpitations but these do not occur on a regular basis.  She states that she does get scared when they happen.  She has not required any specific therapy to this point.  She denies shortness of breath, chest pain, leg swelling, lightheadedness, or syncope.  Past Medical History:  Diagnosis Date  . ANEMIA-IRON DEFICIENCY    presumed GI bleed 08/2012- anticoag stopped  . Atrial fibrillation ([place])    a. Apixaban started 03/2012 - stopped after admx for GI bleed 6/14  . Blood transfusion    a. 05/2010: 3 transfusions for anemia/GIB.  [name] CAD (coronary artery disease)    a. LHC 1/14:  mLAD serial 30 and 60%, oRCA 30%, EF 60%, Afib,   . Cataract   . COLONIC POLYPS, HX OF 03/09/2007  . COPD 03/09/2007   ? pt is unsure  . GERD (gastroesophageal reflux disease)   . GI bleed    thought to be due to AVMs  . Hx of echocardiogram    a. echo 2/11:  EF 55-60%, mild AI, mild RAE, mild to mod TR;   b.  a. Echo 2/14:  Mild focal basal and mild LVH, EF 60-65%, mild AI, mild RAE, trivia effusion  . HYPERGLYCEMIA  07/02/2007  . Hyperlipidemia   . Hypertension 11/12/2006  . LEUKOPENIA, MILD 05/30/2008  . Mitral valve prolapse    Remotely - last echo 2011 did not show this  . NSTEMI (non-ST elevated myocardial infarction) ([place]) 03/2012   a. Type II in setting of AF with RVR 03/2012  . OSTEOARTHRITIS 02/19/2009   Spinal  . OSTEOPOROSIS 11/12/2006  . Positive H. pylori test 10/1996  . PSVT    a. Remotely, in setting of anemia.  [name] Spinal stenosis of lumbar region 07/16/2012  . Transfusion history    7/15     Past Surgical History:  Procedure Laterality Date  . ABDOMINAL HYSTERECTOMY    . CARDIAC CATHETERIZATION  1992   S/P in 1992 at [facility] in [place]. This was negative for any coronary artery disease  . carotid duplex  08/29/2003  . CATARACT EXTRACTION    . ceasarean    . ENTEROSCOPY N/A 10/10/2013   Procedure: ENTEROSCOPY;  Surgeon: Inda Castle, MD;  Location: [facility] ENDOSCOPY;  Service: Endoscopy;  Laterality: N/A;  . ENTEROSCOPY N/A 12/28/2014   Procedure: ENTEROSCOPY;  Surgeon: Inda Castle, MD;  Location: [facility] ENDOSCOPY;  Service: Endoscopy;  Laterality: N/A;  . ENTEROSCOPY N/A 01/20/2016   Procedure: ENTEROSCOPY;  Surgeon: Renelda Loma  Havery Moros, MD;  Location: [facility];  Service: Gastroenterology;  Laterality: N/A;  . HOT HEMOSTASIS N/A 12/28/2014   Procedure: HOT HEMOSTASIS (ARGON PLASMA COAGULATION/BICAP);  Surgeon: Inda Castle, MD;  Location: [facility];  Service: Endoscopy;  Laterality: N/A;  . LEFT HEART CATHETERIZATION WITH CORONARY ANGIOGRAM N/A 04/23/2012   Procedure: LEFT HEART CATHETERIZATION WITH CORONARY ANGIOGRAM;  Surgeon: Larey Dresser, MD;  Location: [facility] CATH LAB;  Service: Cardiovascular;  Laterality: N/A;  . TONSILLECTOMY    . TUBAL LIGATION      Current Medications: Current Meds  Medication Sig  . acetaminophen (TYLENOL) 500 MG tablet Take 2 tablets (1,000 mg total) by mouth 2 (two) times daily.  [name] atorvastatin (LIPITOR) 20 MG tablet  TAKE 1 TABLET BY MOUTH EVERY DAY  . ferrous sulfate 325 (65 FE) MG tablet TAKE 1 TABLET BY MOUTH 2 TIMES DAILY WITH A MEAL.  . metoprolol succinate (TOPROL-XL) 50 MG 24 hr tablet Take 1.5 tablets (75 mg total) by mouth daily. Take with or immediately following a meal.  . metoprolol tartrate (LOPRESSOR) 25 MG tablet Take 1 tablet (25 mg total) by mouth daily as needed (palpitations).  . Multiple Vitamin (MULTIVITAMIN WITH MINERALS) TABS tablet Take 1 tablet by mouth daily. Centrum Silver  . nitroGLYCERIN (NITROSTAT) 0.4 MG SL tablet Place 1 tablet (0.4 mg total) under the tongue every 5 (five) minutes as needed for chest pain (maximum of 3 tablets).     Allergies:   Doxycycline, Biaxin [clarithromycin], and Metronidazole   Social History   Socioeconomic History  . Marital status: Married    Spouse name: Not on file  . Number of children: 1  . Years of education: Not on file  . Highest education level: Not on file  Occupational History  . Occupation: retired    Comment: retired  Tobacco Use  . Smoking status: Never Smoker  . Smokeless tobacco: Never Used  Vaping Use  . Vaping Use: Never used  Substance and Sexual Activity  . Alcohol use: No  . Drug use: No  . Sexual activity: Never  Other Topics Concern  . Not on file  Social History Narrative  . Not on file   Social Determinants of Health   Financial Resource Strain: Not on file  Food Insecurity: Not on file  Transportation Needs: Not on file  Physical Activity: Not on file  Stress: Not on file  Social Connections: Not on file     Family History: The patient's family history includes Colon cancer in her brother; Prostate cancer in her brother; Sarcoidosis in her sister and another family member; Stomach cancer in her mother. There is no history of CAD.  ROS:   Please see the history of present illness.    All other systems reviewed and are negative.  EKGs/Labs/Other Studies Reviewed:    The following studies were  reviewed today: Event monitor 12/19/2019: HR 57-197, average 77. 5 episodes of SVT, longest lasting 6 beats with a rate of 197bpm. Rare ventricular ectopy.  Echocardiogram 12/10/2019: 1. Left ventricular ejection fraction, by estimation, is 60 to 65%. The  left ventricle has normal function. The left ventricle has no regional  wall motion abnormalities. There is moderate left ventricular hypertrophy  of the basal-septal segment. Left  ventricular diastolic parameters are consistent with Grade I diastolic  dysfunction (impaired relaxation). Elevated left ventricular end-diastolic  pressure.  2. Right ventricular systolic function is mildly reduced. The right  ventricular size is normal. There is normal pulmonary artery  systolic  pressure. The estimated right ventricular systolic pressure is 97.9 mmHg.  3. The mitral valve is normal in structure. No evidence of mitral valve  regurgitation. No evidence of mitral stenosis.  4. The aortic valve is tricuspid. There is moderate calcification of the  aortic valve. There is moderate thickening of the aortic valve. Aortic  valve regurgitation is mild. Mild aortic valve stenosis. Aortic  regurgitation PHT measures 511 msec. Aortic  valve area, by VTI measures 1.70 cm. Aortic valve mean gradient measures  28.0 mmHg. Aortic valve Vmax measures 3.15 m/s.  5. The inferior vena cava is normal in size with greater than 50%  respiratory variability, suggesting right atrial pressure of 3 mmHg.   EKG:  EKG is not ordered today.   Recent Labs: 11/26/2019: Magnesium 2.1 12/13/2019: ALT 18; BUN 18; Creatinine, Ser 1.12; Potassium 5.1; Sodium 137; TSH 1.630 04/24/2020: Hemoglobin 13.1; Platelets 268.0  Recent Lipid Panel    Component Value Date/Time   CHOL 170 09/16/2019 1451   TRIG 125.0 09/16/2019 1451   HDL 69.70 09/16/2019 1451   CHOLHDL 2 09/16/2019 1451   VLDL 25.0 09/16/2019 1451   LDLCALC 75 09/16/2019 1451   LDLDIRECT 106.0 03/18/2019  1137     Risk Assessment/Calculations:    CHA2DS2-VASc Score = 5  This indicates a 7.2% annual risk of stroke. The patient's score is based upon: CHF History: No HTN History: Yes Diabetes History: No Stroke History: No Vascular Disease History: Yes      Physical Exam:    VS:  BP 130/86   Pulse 96   Ht 5\' 4"  (1.626 m)   Wt 112 lb 9.6 oz (51.1 kg)   SpO2 97%   BMI 19.33 kg/m     Wt Readings from Last 3 Encounters:  06/06/20 112 lb 9.6 oz (51.1 kg)  05/29/20 114 lb (51.7 kg)  04/24/20 113 lb 6.4 oz (51.4 kg)     GEN:  Well nourished, well developed elderly woman in no acute distress HEENT: Normal NECK: No JVD; No carotid bruits LYMPHATICS: No lymphadenopathy CARDIAC: RRR, no murmurs, rubs, gallops RESPIRATORY:  Clear to auscultation without rales, wheezing or rhonchi  ABDOMEN: Soft, non-tender, non-distended MUSCULOSKELETAL:  No edema; No deformity  SKIN: Warm and dry NEUROLOGIC:  Alert and oriented x 3 PSYCHIATRIC:  Normal affect   ASSESSMENT:    1. AF (paroxysmal atrial fibrillation) ([facility])    PLAN:    In order of problems listed above:  1. The appears clinically stable.  She was recently evaluated by Dr. Quentin Ore.  She continues on metoprolol succinate.  I have given her a prescription for metoprolol tartrate 25 mg to be used only on an as-needed basis if she experiences palpitations.  She has not been on oral anticoagulation because of gastrointestinal and has declined this in the past.  In the past.  I will see her back for follow-up evaluation in 1 year. Last year's echo study reviewed, normal LV function and no significant valvular disease.     Medication Adjustments/Labs and Tests Ordered: Current medicines are reviewed at length with the patient today.  Concerns regarding medicines are outlined above.  No orders of the defined types were placed in this encounter.  Meds ordered this encounter  Medications  . metoprolol tartrate (LOPRESSOR) 25 MG  tablet    Sig: Take 1 tablet (25 mg total) by mouth daily as needed (palpitations).    Dispense:  30 tablet    Refill:  3  Patient Instructions  Medication Instructions:  1) START METOPROLOL TARTRATE 25 mg once daily as needed for palpitations *If you need a refill on your cardiac medications before your next appointment, please call your pharmacy*  Follow-Up: At [facility], you and your health needs are our priority.  As part of our continuing mission to provide you with exceptional heart care, we have created designated Provider Care Teams.  These Care Teams include your primary Cardiologist (physician) and Advanced Practice Providers (APPs -  Physician Assistants and Nurse Practitioners) who all work together to provide you with the care you need, when you need it. Your next appointment:   12 month(s) The format for your next appointment:   In Person Provider:   You may see Sherren Mocha, MD or one of the following Advanced Practice Providers on your designated Care Team:    [name], PA-C  Robbie Lis, [place]      Signed, Sherren Mocha, MD  06/06/2020 1:42 PM    [facility]

## 2020-06-06 NOTE — Patient Instructions (Signed)
Medication Instructions:  1) START METOPROLOL TARTRATE 25 mg once daily as needed for palpitations *If you need a refill on your cardiac medications before your next appointment, please call your pharmacy*  Follow-Up: At [facility], you and your health needs are our priority.  As part of our continuing mission to provide you with exceptional heart care, we have created designated Provider Care Teams.  These Care Teams include your primary Cardiologist (physician) and Advanced Practice Providers (APPs -  Physician Assistants and Nurse Practitioners) who all work together to provide you with the care you need, when you need it. Your next appointment:   12 month(s) The format for your next appointment:   In Person Provider:   You may see Sherren Mocha, MD or one of the following Advanced Practice Providers on your designated Care Team:    [name], PA-C  [place], [place]

## 2020-06-11 ENCOUNTER — Other Ambulatory Visit: Payer: Self-pay

## 2020-06-12 ENCOUNTER — Other Ambulatory Visit: Payer: Self-pay | Admitting: Internal Medicine

## 2020-06-14 ENCOUNTER — Ambulatory Visit (INDEPENDENT_AMBULATORY_CARE_PROVIDER_SITE_OTHER): Payer: Medicare Other | Admitting: Internal Medicine

## 2020-06-14 ENCOUNTER — Other Ambulatory Visit: Payer: Self-pay

## 2020-06-14 ENCOUNTER — Encounter: Payer: Self-pay | Admitting: Internal Medicine

## 2020-06-14 VITALS — BP 122/70 | HR 61 | Temp 97.9°F | Resp 18 | Ht 64.0 in | Wt 112.0 lb

## 2020-06-14 DIAGNOSIS — Z6820 Body mass index (BMI) 20.0-20.9, adult: Secondary | ICD-10-CM | POA: Diagnosis not present

## 2020-06-14 DIAGNOSIS — M542 Cervicalgia: Secondary | ICD-10-CM | POA: Diagnosis not present

## 2020-06-14 DIAGNOSIS — Z1272 Encounter for screening for malignant neoplasm of vagina: Secondary | ICD-10-CM | POA: Diagnosis not present

## 2020-06-14 DIAGNOSIS — Z124 Encounter for screening for malignant neoplasm of cervix: Secondary | ICD-10-CM | POA: Diagnosis not present

## 2020-06-14 DIAGNOSIS — Z90710 Acquired absence of both cervix and uterus: Secondary | ICD-10-CM | POA: Diagnosis not present

## 2020-06-14 DIAGNOSIS — Z7689 Persons encountering health services in other specified circumstances: Secondary | ICD-10-CM | POA: Diagnosis not present

## 2020-06-14 DIAGNOSIS — Z1231 Encounter for screening mammogram for malignant neoplasm of breast: Secondary | ICD-10-CM | POA: Diagnosis not present

## 2020-06-14 MED ORDER — DICLOFENAC SODIUM 1 % EX GEL: 2.0000 g | Freq: Four times a day (QID) | CUTANEOUS | 3 refills | Status: DC

## 2020-06-14 NOTE — Patient Instructions (Signed)
We have sent in voltaren gel to use topically on the neck up to 4 times a day. Use this if needed for pain.    Neck Exercises Ask your health care provider which exercises are safe for you. Do exercises exactly as told by your health care provider and adjust them as directed. It is normal to feel mild stretching, pulling, tightness, or discomfort as you do these exercises. Stop right away if you feel sudden pain or your pain gets worse. Do not begin these exercises until told by your health care provider. Neck exercises can be important for many reasons. They can improve strength and maintain flexibility in your neck, which will help your upper back and prevent neck pain. Stretching exercises Rotation neck stretching 1. Sit in a chair or stand up. 2. Place your feet flat on the floor, shoulder width apart. 3. Slowly turn your head (rotate) to the right until a slight stretch is felt. Turn it all the way to the right so you can look over your right shoulder. Do not tilt or tip your head. 4. Hold this position for 10-30 seconds. 5. Slowly turn your head (rotate) to the left until a slight stretch is felt. Turn it all the way to the left so you can look over your left shoulder. Do not tilt or tip your head. 6. Hold this position for 10-30 seconds. Repeat __________ times. Complete this exercise __________ times a day.   Neck retraction 1. Sit in a sturdy chair or stand up. 2. Look straight ahead. Do not bend your neck. 3. Use your fingers to push your chin backward (retraction). Do not bend your neck for this movement. Continue to face straight ahead. If you are doing the exercise properly, you will feel a slight sensation in your throat and a stretch at the back of your neck. 4. Hold the stretch for 1-2 seconds. Repeat __________ times. Complete this exercise __________ times a day. Strengthening exercises Neck press 1. Lie on your back on a firm bed or on the floor with a pillow under your  head. 2. Use your neck muscles to push your head down on the pillow and straighten your spine. 3. Hold the position as well as you can. Keep your head facing up (in a neutral position) and your chin tucked. 4. Slowly count to 5 while holding this position. Repeat __________ times. Complete this exercise __________ times a day. Isometrics These are exercises in which you strengthen the muscles in your neck while keeping your neck still (isometrics). 1. Sit in a supportive chair and place your hand on your forehead. 2. Keep your head and face facing straight ahead. Do not flex or extend your neck while doing isometrics. 3. Push forward with your head and neck while pushing back with your hand. Hold for 10 seconds. 4. Do the sequence again, this time putting your hand against the back of your head. Use your head and neck to push backward against the hand pressure. 5. Finally, do the same exercise on either side of your head, pushing sideways against the pressure of your hand. Repeat __________ times. Complete this exercise __________ times a day. Prone head lifts 1. Lie face-down (prone position), resting on your elbows so that your chest and upper back are raised. 2. Start with your head facing downward, near your chest. Position your chin either on or near your chest. 3. Slowly lift your head upward. Lift until you are looking straight ahead. Then continue lifting  your head as far back as you can comfortably stretch. 4. Hold your head up for 5 seconds. Then slowly lower it to your starting position. Repeat __________ times. Complete this exercise __________ times a day. Supine head lifts 1. Lie on your back (supine position), bending your knees to point to the ceiling and keeping your feet flat on the floor. 2. Lift your head slowly off the floor, raising your chin toward your chest. 3. Hold for 5 seconds. Repeat __________ times. Complete this exercise __________ times a day. Scapular  retraction 1. Stand with your arms at your sides. Look straight ahead. 2. Slowly pull both shoulders (scapulae) backward and downward (retraction) until you feel a stretch between your shoulder blades in your upper back. 3. Hold for 10-30 seconds. 4. Relax and repeat. Repeat __________ times. Complete this exercise __________ times a day. Contact a health care provider if:  Your neck pain or discomfort gets much worse when you do an exercise.  Your neck pain or discomfort does not improve within 2 hours after you exercise. If you have any of these problems, stop exercising right away. Do not do the exercises again unless your health care provider says that you can. Get help right away if:  You develop sudden, severe neck pain. If this happens, stop exercising right away. Do not do the exercises again unless your health care provider says that you can. This information is not intended to replace advice given to you by your health care provider. Make sure you discuss any questions you have with your health care provider. Document Revised: 01/13/2018 Document Reviewed: 01/13/2018 Elsevier Patient Education  2021 [employer].

## 2020-06-14 NOTE — Progress Notes (Unsigned)
   Subjective:   Patient ID: Lindsay Doyle, female    DOB: September 05, 1933, 87 y.o.   MRN: [id number]  HPI The patient is an 87 YO female coming in for intermittent neck pain. Happens several times a month with feeling the muscles get tight. She has some pressure daily with using computer and crocheting. She can use pressure to relieve the pain. Denies numbness or tingling in the arms. Denies weakness in the arms. Overall stable and not progressing.  Review of Systems  Constitutional: Negative.   HENT: Negative.   Eyes: Negative.   Respiratory: Negative for cough, chest tightness and shortness of breath.   Cardiovascular: Negative for chest pain, palpitations and leg swelling.  Gastrointestinal: Negative for abdominal distention, abdominal pain, constipation, diarrhea, nausea and vomiting.  Musculoskeletal: Positive for neck pain.  Skin: Negative.   Neurological: Negative.   Psychiatric/Behavioral: Negative.     Objective:  Physical Exam Constitutional:      Appearance: She is well-developed.  HENT:     Head: Normocephalic and atraumatic.  Cardiovascular:     Rate and Rhythm: Normal rate and regular rhythm.  Pulmonary:     Effort: Pulmonary effort is normal. No respiratory distress.     Breath sounds: Normal breath sounds. No wheezing or rales.  Abdominal:     General: Bowel sounds are normal. There is no distension.     Palpations: Abdomen is soft.     Tenderness: There is no abdominal tenderness. There is no rebound.  Musculoskeletal:        General: No tenderness.     Cervical back: Normal range of motion.  Skin:    General: Skin is warm and dry.  Neurological:     Mental Status: She is alert and oriented to person, place, and time.     Coordination: Coordination normal.     Vitals:   06/14/20 1321  BP: 122/70  Pulse: 61  Resp: 18  Temp: 97.9 F (36.6 C)  TempSrc: Oral  SpO2: 100%  Weight: 112 lb (50.8 kg)  Height: 5\' 4"  (1.626 m)    This visit occurred  during the SARS-CoV-2 public health emergency.  Safety protocols were in place, including screening questions prior to the visit, additional usage of staff PPE, and extensive cleaning of exam room while observing appropriate contact time as indicated for disinfecting solutions.   Assessment & Plan:

## 2020-06-15 ENCOUNTER — Telehealth: Payer: Self-pay | Admitting: Internal Medicine

## 2020-06-15 ENCOUNTER — Encounter: Payer: Self-pay | Admitting: Internal Medicine

## 2020-06-15 NOTE — Telephone Encounter (Signed)
See below

## 2020-06-15 NOTE — Telephone Encounter (Signed)
diclofenac Sodium She was prescribed this yesterday by Dr. Sharlet Salina and says she is not taking it because on the paper it says not to take if you have a heart condition and she wants it removed from her chart so no one will try to give it to her.

## 2020-06-15 NOTE — Telephone Encounter (Signed)
It is safe for her to take but if she wants it can be removed from her chart.

## 2020-06-15 NOTE — Assessment & Plan Note (Signed)
Suspect related to work and crocheting. Advised on exercises and rx for voltaren gel to use topically.

## 2020-07-16 ENCOUNTER — Telehealth: Payer: Self-pay | Admitting: *Deleted

## 2020-07-16 NOTE — Telephone Encounter (Signed)
Scheduled 2nd Booster at [place] on 08/08/20 for 1:00p.

## 2020-07-30 DIAGNOSIS — Z23 Encounter for immunization: Secondary | ICD-10-CM | POA: Diagnosis not present

## 2020-08-08 ENCOUNTER — Ambulatory Visit: Payer: Medicare Other

## 2020-08-20 ENCOUNTER — Telehealth: Payer: Self-pay | Admitting: Internal Medicine

## 2020-08-20 NOTE — Telephone Encounter (Signed)
LM with patient's husband. To have pt rtn my call to schedule AWV with [facility]. Please schedule AWV if pt calls the office.

## 2020-08-23 ENCOUNTER — Ambulatory Visit (INDEPENDENT_AMBULATORY_CARE_PROVIDER_SITE_OTHER): Payer: Medicare Other | Admitting: Internal Medicine

## 2020-08-23 ENCOUNTER — Encounter: Payer: Self-pay | Admitting: Internal Medicine

## 2020-08-23 ENCOUNTER — Other Ambulatory Visit: Payer: Self-pay

## 2020-08-23 DIAGNOSIS — M549 Dorsalgia, unspecified: Secondary | ICD-10-CM | POA: Diagnosis not present

## 2020-08-23 DIAGNOSIS — I10 Essential (primary) hypertension: Secondary | ICD-10-CM

## 2020-08-23 DIAGNOSIS — R739 Hyperglycemia, unspecified: Secondary | ICD-10-CM

## 2020-08-23 MED ORDER — TIZANIDINE HCL 2 MG PO TABS: 2.0000 mg | ORAL_TABLET | Freq: Four times a day (QID) | ORAL | 1 refills | Status: DC | PRN

## 2020-08-23 NOTE — Progress Notes (Signed)
Patient ID: Lindsay Doyle, female   DOB: 31-Aug-1933, 87 y.o.   MRN: [id number]        Chief Complaint: upper back pain bilateral       HPI:  Lindsay Doyle is a 86 y.o. female here with c/o bilateral upper back pain after 2 yrs during the pandemic of crochet and computer work at home.  No midline spine pain or radicular symptoms. Not taking the volt gel per Dr Sharlet Salina since the side effect paper mentioned htn, anemia , bleeding issues, heart disease and diarrha, so just did not try this.  Pt denies bowel or bladder change, fever, wt loss,  worsening LE pain/numbness/weakness, gait change or falls.  Pt denies chest pain, increased sob or doe, wheezing, orthopnea, PND, increased LE swelling, palpitations, dizziness or syncope.   Pt denies polydipsia, polyuria, or new focal neuro s/s.    Wt Readings from Last 3 Encounters:  08/23/20 116 lb 9.6 oz (52.9 kg)  06/14/20 112 lb (50.8 kg)  06/06/20 112 lb 9.6 oz (51.1 kg)   BP Readings from Last 3 Encounters:  08/23/20 138/78  06/14/20 122/70  06/06/20 130/86         Past Medical History:  Diagnosis Date  . ANEMIA-IRON DEFICIENCY    presumed GI bleed 08/2012- anticoag stopped  . Atrial fibrillation ([place])    a. Apixaban started 03/2012 - stopped after admx for GI bleed 6/14  . Blood transfusion    a. 05/2010: 3 transfusions for anemia/GIB.  [name] CAD (coronary artery disease)    a. LHC 1/14:  mLAD serial 30 and 60%, oRCA 30%, EF 60%, Afib,   . Cataract   . COLONIC POLYPS, HX OF 03/09/2007  . COPD 03/09/2007   ? pt is unsure  . GERD (gastroesophageal reflux disease)   . GI bleed    thought to be due to AVMs  . Hx of echocardiogram    a. echo 2/11:  EF 55-60%, mild AI, mild RAE, mild to mod TR;   b.  a. Echo 2/14:  Mild focal basal and mild LVH, EF 60-65%, mild AI, mild RAE, trivia effusion  . HYPERGLYCEMIA 07/02/2007  . Hyperlipidemia   . Hypertension 11/12/2006  . LEUKOPENIA, MILD 05/30/2008  . Mitral valve prolapse    Remotely - last echo 2011  did not show this  . NSTEMI (non-ST elevated myocardial infarction) ([place]) 03/2012   a. Type II in setting of AF with RVR 03/2012  . OSTEOARTHRITIS 02/19/2009   Spinal  . OSTEOPOROSIS 11/12/2006  . Positive H. pylori test 10/1996  . PSVT    a. Remotely, in setting of anemia.  [name] Spinal stenosis of lumbar region 07/16/2012  . Transfusion history    7/15    Past Surgical History:  Procedure Laterality Date  . ABDOMINAL HYSTERECTOMY    . CARDIAC CATHETERIZATION  1992   S/P in 1992 at [facility] in [place]. This was negative for any coronary artery disease  . carotid duplex  08/29/2003  . CATARACT EXTRACTION    . ceasarean    . ENTEROSCOPY N/A 10/10/2013   Procedure: ENTEROSCOPY;  Surgeon: Inda Castle, MD;  Location: [facility] ENDOSCOPY;  Service: Endoscopy;  Laterality: N/A;  . ENTEROSCOPY N/A 12/28/2014   Procedure: ENTEROSCOPY;  Surgeon: Inda Castle, MD;  Location: [facility] ENDOSCOPY;  Service: Endoscopy;  Laterality: N/A;  . ENTEROSCOPY N/A 01/20/2016   Procedure: ENTEROSCOPY;  Surgeon: Manus Gunning, MD;  Location: [facility] ENDOSCOPY;  Service: Gastroenterology;  Laterality: N/A;  . HOT HEMOSTASIS N/A 12/28/2014   Procedure: HOT HEMOSTASIS (ARGON PLASMA COAGULATION/BICAP);  Surgeon: Inda Castle, MD;  Location: [facility];  Service: Endoscopy;  Laterality: N/A;  . LEFT HEART CATHETERIZATION WITH CORONARY ANGIOGRAM N/A 04/23/2012   Procedure: LEFT HEART CATHETERIZATION WITH CORONARY ANGIOGRAM;  Surgeon: Larey Dresser, MD;  Location: [facility] CATH LAB;  Service: Cardiovascular;  Laterality: N/A;  . TONSILLECTOMY    . TUBAL LIGATION      reports that she has never smoked. She has never used smokeless tobacco. She reports that she does not drink alcohol and does not use drugs. family history includes Colon cancer in her brother; Prostate cancer in her brother; Sarcoidosis in her sister and another family member; Stomach cancer in her mother. Allergies  Allergen Reactions   . Doxycycline Diarrhea    Possible diarrhea  . Biaxin [Clarithromycin] Other (See Comments)    diarrhea  . Metronidazole Other (See Comments)    Pt had difficulty swallowing this and says it was "horrible" to take. No allergic reaction.   Current Outpatient Medications on File Prior to Visit  Medication Sig Dispense Refill  . acetaminophen (TYLENOL) 500 MG tablet Take 2 tablets (1,000 mg total) by mouth 2 (two) times daily. 60 tablet 11  . atorvastatin (LIPITOR) 20 MG tablet Take 1 tablet (20 mg total) by mouth daily. Annual appt w/labs are due in June must see provider for future refills 90 tablet 0  . metoprolol succinate (TOPROL-XL) 50 MG 24 hr tablet Take 1.5 tablets (75 mg total) by mouth daily. Take with or immediately following a meal. 135 tablet 3  . metoprolol tartrate (LOPRESSOR) 25 MG tablet Take 1 tablet (25 mg total) by mouth daily as needed (palpitations). 30 tablet 3  . Multiple Vitamin (MULTIVITAMIN WITH MINERALS) TABS tablet Take 1 tablet by mouth daily. Centrum Silver    . nitroGLYCERIN (NITROSTAT) 0.4 MG SL tablet Place 1 tablet (0.4 mg total) under the tongue every 5 (five) minutes as needed for chest pain (maximum of 3 tablets). 25 tablet 7  . ferrous sulfate 325 (65 FE) MG tablet TAKE 1 TABLET BY MOUTH 2 TIMES DAILY WITH A MEAL. (Patient not taking: No sig reported) 180 tablet 0   No current facility-administered medications on file prior to visit.        ROS:  All others reviewed and negative.  Objective        PE:  BP 138/78 (BP Location: Right Arm, Patient Position: Sitting, Cuff Size: Normal)   Pulse 99   Temp 97.8 F (36.6 C) (Oral)   Ht 5\' 4"  (1.626 m)   Wt 116 lb 9.6 oz (52.9 kg)   SpO2 100%   BMI 20.01 kg/m                 Constitutional: Pt appears in NAD               HENT: Head: NCAT.                Right Ear: External ear normal.                 Left Ear: External ear normal.                Eyes: . Pupils are equal, round, and reactive to  light. Conjunctivae and EOM are normal               Nose: without d/c or  deformity               Neck: Neck supple. Gross normal ROM               Cardiovascular: Normal rate and regular rhythm.                 Pulmonary/Chest: Effort normal and breath sounds without rales or wheezing.                bialteral trapezoid areas tender without rash or swelling               Neurological: Pt is alert. At baseline orientation, motor grossly intact               Skin: Skin is warm. No rashes, no other new lesions, LE edema - none               Psychiatric: Pt behavior is normal without agitation   Micro: none  Cardiac tracings I have personally interpreted today:  none  Pertinent Radiological findings (summarize): none   Lab Results  Component Value Date   WBC 7.0 04/24/2020   HGB 13.1 04/24/2020   HCT 38.7 04/24/2020   PLT 268.0 04/24/2020   GLUCOSE 101 (H) 12/13/2019   CHOL 170 09/16/2019   TRIG 125.0 09/16/2019   HDL 69.70 09/16/2019   LDLDIRECT 106.0 03/18/2019   LDLCALC 75 09/16/2019   ALT 18 12/13/2019   AST 17 12/13/2019   NA 137 12/13/2019   K 5.1 12/13/2019   CL 98 12/13/2019   CREATININE 1.12 (H) 12/13/2019   BUN 18 12/13/2019   CO2 23 12/13/2019   TSH 1.630 12/13/2019   INR 1.06 01/20/2016   HGBA1C 5.9 09/16/2019   Assessment/Plan:  Lindsay Doyle is a 87 y.o. Black or African American [2] female with  has a past medical history of ANEMIA-IRON DEFICIENCY, Atrial fibrillation ([place]), Blood transfusion, CAD (coronary artery disease), Cataract, COLONIC POLYPS, HX OF (03/09/2007), COPD (03/09/2007), GERD (gastroesophageal reflux disease), GI bleed, echocardiogram, HYPERGLYCEMIA (07/02/2007), Hyperlipidemia, Hypertension (11/12/2006), LEUKOPENIA, MILD (05/30/2008), Mitral valve prolapse, NSTEMI (non-ST elevated myocardial infarction) ([place]) (03/2012), OSTEOARTHRITIS (02/19/2009), OSTEOPOROSIS (11/12/2006), Positive H. pylori test (10/1996), PSVT, Spinal stenosis of lumbar region  (07/16/2012), and Transfusion history.  Upper back pain Pain c/w msk strain, for muscle relaxer prn, and topical salonpaz prn,  to f/u any worsening symptoms or concerns    Essential hypertension BP Readings from Last 3 Encounters:  08/23/20 138/78  06/14/20 122/70  06/06/20 130/86   Stable, pt to continue medical treatment toprol   Hyperglycemia Lab Results  Component Value Date   HGBA1C 5.9 09/16/2019   Stable, pt to continue current medical treatment  - diet   Followup: Return if symptoms worsen or fail to improve.  Cathlean Cower, MD 08/26/2020 3:42 PM [facility]

## 2020-08-23 NOTE — Patient Instructions (Signed)
Please take all new medication as prescribed - the muscle relaxer as needed  You can also use the OTC Salonpaz pain patch (lidocaine in a patch) as needed topically  Please continue all other medications as before, and refills have been done if requested.  Please have the pharmacy call with any other refills you may need.  Please keep your appointments with your specialists as you may have planned

## 2020-08-26 ENCOUNTER — Encounter: Payer: Self-pay | Admitting: Internal Medicine

## 2020-08-26 NOTE — Assessment & Plan Note (Signed)
BP Readings from Last 3 Encounters:  08/23/20 138/78  06/14/20 122/70  06/06/20 130/86   Stable, pt to continue medical treatment toprol

## 2020-08-26 NOTE — Assessment & Plan Note (Signed)
Lab Results  Component Value Date   HGBA1C 5.9 09/16/2019   Stable, pt to continue current medical treatment  - diet

## 2020-08-26 NOTE — Assessment & Plan Note (Signed)
Pain c/w msk strain, for muscle relaxer prn, and topical salonpaz prn,  to f/u any worsening symptoms or concerns

## 2020-08-31 ENCOUNTER — Other Ambulatory Visit: Payer: Self-pay

## 2020-08-31 ENCOUNTER — Ambulatory Visit (INDEPENDENT_AMBULATORY_CARE_PROVIDER_SITE_OTHER): Payer: Medicare Other

## 2020-08-31 VITALS — BP 120/60 | HR 86 | Temp 98.0°F | Resp 16 | Ht 64.0 in | Wt 117.8 lb

## 2020-08-31 DIAGNOSIS — Z0000 Encounter for general adult medical examination without abnormal findings: Secondary | ICD-10-CM | POA: Diagnosis not present

## 2020-08-31 NOTE — Progress Notes (Signed)
Subjective:   Lindsay Doyle is a 87 y.o. female who presents for Medicare Annual (Subsequent) preventive examination.  Review of Systems    No ROS. Medicare Wellness Visit. Additional risk factors are reflected in social history. Cardiac Risk Factors include: advanced age (>70men, >47 women);dyslipidemia;hypertension Sleep Patterns: No sleep issues, feels rested on waking and sleeps 8 hours nightly. Home Safety/Smoke Alarms: Feels safe in home; uses home alarm. Smoke alarms in place. Living environment: 1-story home; Lives with spouse; no needs for DME; good support system. Seat Belt Safety/Bike Helmet: Wears seat belt.    Objective:    Today's Vitals   08/31/20 1228  BP: 120/60  Pulse: 86  Resp: 16  Temp: 98 F (36.7 C)  SpO2: 97%  Weight: 117 lb 12.8 oz (53.4 kg)  Height: 5\' 4"  (1.626 m)  PainSc: 0-No pain   Body mass index is 20.22 kg/m.  Advanced Directives 08/31/2020 07/11/2019 06/01/2019 05/31/2018 05/26/2017 05/20/2016 01/19/2016  Does Patient Have a Medical Advance Directive? Yes Yes No Yes Yes Yes No  Type of Paramedic of [place];Living will Healthcare Power of [place];Living will [place];Living will [place];Living will -  Does patient want to make changes to medical advance directive? No - Patient declined No - Patient declined - - - - -  Copy of [place] in Chart? No - copy requested No - copy requested - No - copy requested No - copy requested No - copy requested -  Would patient like information on creating a medical advance directive? - - No - Patient declined - - - No - patient declined information    Current Medications (verified) Outpatient Encounter Medications as of 08/31/2020  Medication Sig  . acetaminophen (TYLENOL) 500 MG tablet Take 2 tablets (1,000 mg total) by mouth 2 (two) times daily.  [name] atorvastatin (LIPITOR) 20 MG tablet Take 1  tablet (20 mg total) by mouth daily. Annual appt w/labs are due in June must see provider for future refills  . ferrous sulfate 325 (65 FE) MG tablet TAKE 1 TABLET BY MOUTH 2 TIMES DAILY WITH A MEAL. (Patient not taking: No sig reported)  . metoprolol succinate (TOPROL-XL) 50 MG 24 hr tablet Take 1.5 tablets (75 mg total) by mouth daily. Take with or immediately following a meal.  . metoprolol tartrate (LOPRESSOR) 25 MG tablet Take 1 tablet (25 mg total) by mouth daily as needed (palpitations).  . Multiple Vitamin (MULTIVITAMIN WITH MINERALS) TABS tablet Take 1 tablet by mouth daily. Centrum Silver  . nitroGLYCERIN (NITROSTAT) 0.4 MG SL tablet Place 1 tablet (0.4 mg total) under the tongue every 5 (five) minutes as needed for chest pain (maximum of 3 tablets).  [name] tiZANidine (ZANAFLEX) 2 MG tablet Take 1 tablet (2 mg total) by mouth every 6 (six) hours as needed for muscle spasms.   No facility-administered encounter medications on file as of 08/31/2020.    Allergies (verified) Doxycycline, Biaxin [clarithromycin], and Metronidazole   History: Past Medical History:  Diagnosis Date  . ANEMIA-IRON DEFICIENCY    presumed GI bleed 08/2012- anticoag stopped  . Atrial fibrillation ([place])    a. Apixaban started 03/2012 - stopped after admx for GI bleed 6/14  . Blood transfusion    a. 05/2010: 3 transfusions for anemia/GIB.  [name] CAD (coronary artery disease)    a. LHC 1/14:  mLAD serial 30 and 60%, oRCA 30%, EF 60%, Afib,   .  Cataract   . COLONIC POLYPS, HX OF 03/09/2007  . COPD 03/09/2007   ? pt is unsure  . GERD (gastroesophageal reflux disease)   . GI bleed    thought to be due to AVMs  . Hx of echocardiogram    a. echo 2/11:  EF 55-60%, mild AI, mild RAE, mild to mod TR;   b.  a. Echo 2/14:  Mild focal basal and mild LVH, EF 60-65%, mild AI, mild RAE, trivia effusion  . HYPERGLYCEMIA 07/02/2007  . Hyperlipidemia   . Hypertension 11/12/2006  . LEUKOPENIA, MILD 05/30/2008  . Mitral valve prolapse     Remotely - last echo 2011 did not show this  . NSTEMI (non-ST elevated myocardial infarction) ([place]) 03/2012   a. Type II in setting of AF with RVR 03/2012  . OSTEOARTHRITIS 02/19/2009   Spinal  . OSTEOPOROSIS 11/12/2006  . Positive H. pylori test 10/1996  . PSVT    a. Remotely, in setting of anemia.  [name] Spinal stenosis of lumbar region 07/16/2012  . Transfusion history    7/15    Past Surgical History:  Procedure Laterality Date  . ABDOMINAL HYSTERECTOMY    . CARDIAC CATHETERIZATION  1992   S/P in 1992 at [facility] in [place]. This was negative for any coronary artery disease  . carotid duplex  08/29/2003  . CATARACT EXTRACTION    . ceasarean    . ENTEROSCOPY N/A 10/10/2013   Procedure: ENTEROSCOPY;  Surgeon: Inda Castle, MD;  Location: [facility] ENDOSCOPY;  Service: Endoscopy;  Laterality: N/A;  . ENTEROSCOPY N/A 12/28/2014   Procedure: ENTEROSCOPY;  Surgeon: Inda Castle, MD;  Location: [facility] ENDOSCOPY;  Service: Endoscopy;  Laterality: N/A;  . ENTEROSCOPY N/A 01/20/2016   Procedure: ENTEROSCOPY;  Surgeon: Manus Gunning, MD;  Location: [facility] ENDOSCOPY;  Service: Gastroenterology;  Laterality: N/A;  . HOT HEMOSTASIS N/A 12/28/2014   Procedure: HOT HEMOSTASIS (ARGON PLASMA COAGULATION/BICAP);  Surgeon: Inda Castle, MD;  Location: [facility];  Service: Endoscopy;  Laterality: N/A;  . LEFT HEART CATHETERIZATION WITH CORONARY ANGIOGRAM N/A 04/23/2012   Procedure: LEFT HEART CATHETERIZATION WITH CORONARY ANGIOGRAM;  Surgeon: Larey Dresser, MD;  Location: [facility] CATH LAB;  Service: Cardiovascular;  Laterality: N/A;  . TONSILLECTOMY    . TUBAL LIGATION     Family History  Problem Relation Age of Onset  . Prostate cancer Brother   . Stomach cancer Mother   . Sarcoidosis Sister   . Colon cancer Brother   . Sarcoidosis Other   . CAD Neg Hx    Social History   Socioeconomic History  . Marital status: Married    Spouse name: Not on file  . Number of  children: 1  . Years of education: Not on file  . Highest education level: Not on file  Occupational History  . Occupation: retired    Comment: retired  Tobacco Use  . Smoking status: Never Smoker  . Smokeless tobacco: Never Used  Vaping Use  . Vaping Use: Never used  Substance and Sexual Activity  . Alcohol use: No  . Drug use: No  . Sexual activity: Never  Other Topics Concern  . Not on file  Social History Narrative  . Not on file   Social Determinants of Health   Financial Resource Strain: Low Risk   . Difficulty of Paying Living Expenses: Not hard at all  Food Insecurity: No Food Insecurity  . Worried About Charity fundraiser in  the Last Year: Never true  . Ran Out of Food in the Last Year: Never true  Transportation Needs: No Transportation Needs  . Lack of Transportation (Medical): No  . Lack of Transportation (Non-Medical): No  Physical Activity: Sufficiently Active  . Days of Exercise per Week: 6 days  . Minutes of Exercise per Session: 60 min  Stress: No Stress Concern Present  . Feeling of Stress : Not at all  Social Connections: Socially Integrated  . Frequency of Communication with Friends and Family: More than three times a week  . Frequency of Social Gatherings with Friends and Family: Three times a week  . Attends Religious Services: More than 4 times per year  . Active Member of Clubs or Organizations: Yes  . Attends Archivist Meetings: More than 4 times per year  . Marital Status: Married    Tobacco Counseling Counseling given: Not Answered   Clinical Intake:  Pre-visit preparation completed: Yes  Pain : No/denies pain Pain Score: 0-No pain     BMI - recorded: 20.22 Nutritional Status: BMI of 19-24  Normal Nutritional Risks: None Diabetes: No  How often do you need to have someone help you when you read instructions, pamphlets, or other written materials from your doctor or pharmacy?: 1 - Never What is the last grade level  you completed in school?: 2 years of [employer]  Diabetic? no  Interpreter Needed?: No  Information entered by :: Lisette Abu, LPN   Activities of Daily Living In your present state of health, do you have any difficulty performing the following activities: 08/31/2020 08/23/2020  Hearing? N N  Vision? N N  Difficulty concentrating or making decisions? N N  Walking or climbing stairs? N N  Dressing or bathing? N N  Doing errands, shopping? N N  Preparing Food and eating ? N -  Using the Toilet? N -  In the past six months, have you accidently leaked urine? N -  Do you have problems with loss of bowel control? N -  Managing your Medications? N -  Managing your Finances? N -  Housekeeping or managing your Housekeeping? N -  Some recent data might be hidden    Patient Care Team: Biagio Borg, MD as PCP - General (Internal Medicine) Sherren Mocha, MD as PCP - Cardiology (Cardiology) Vickie Epley, MD as PCP - Electrophysiology (Cardiology) Armbruster, Carlota Raspberry, MD as Consulting Physician (Gastroenterology)  Indicate any recent Medical Services you may have received from other than [facility] providers in the past year (date may be approximate).     Assessment:   This is a routine wellness examination for Lexa.  Hearing/Vision screen No exam data present  Dietary issues and exercise activities discussed: Current Exercise Habits: Home exercise routine, Type of exercise: strength training/weights;stretching;walking;Other - see comments (TV EXERCISES), Time (Minutes): 60, Frequency (Times/Week): 5, Weekly Exercise (Minutes/Week): 300, Intensity: Moderate, Exercise limited by: orthopedic condition(s);cardiac condition(s);respiratory conditions(s)  Goals Addressed   None    Depression Screen PHQ 2/9 Scores 08/31/2020 09/16/2019 07/11/2019 09/16/2018 05/31/2018 03/12/2018 05/26/2017  PHQ - 2 Score 0 1 0 1 0 1 0  PHQ- 9 Score - - - - - - 0    Fall Risk Fall Risk  08/31/2020  09/16/2019 07/11/2019 09/16/2018 05/31/2018  Falls in the past year? 0 0 0 0 0  Number falls in past yr: 0 - 0 - -  Injury with Fall? 0 - 0 - -  Comment - - - - -  Risk for fall due to : No Fall Risks - No Fall Risks - -  Follow up Falls evaluation completed - Falls evaluation completed;Education provided;Falls prevention discussed - -    FALL RISK PREVENTION PERTAINING TO THE HOME:  Any stairs in or around the home? No  If so, are there any without handrails? No  Home free of loose throw rugs in walkways, pet beds, electrical cords, etc? Yes  Adequate lighting in your home to reduce risk of falls? Yes   ASSISTIVE DEVICES UTILIZED TO PREVENT FALLS:  Life alert? No  Use of a cane, walker or w/c? No  Grab bars in the bathroom? Yes  Shower chair or bench in shower? Yes  Elevated toilet seat or a handicapped toilet? Yes   TIMED UP AND GO:  Was the test performed? No .  Length of time to ambulate 10 feet: 0 sec.   Gait steady and fast without use of assistive device  Cognitive Function: Normal cognitive status assessed by direct observation by this Nurse Health Advisor. No abnormalities found.   MMSE - Mini Mental State Exam 05/31/2018 05/26/2017 05/20/2016  Orientation to time 5 5 5   Orientation to Place 5 5 5   Registration 3 3 3   Attention/ Calculation 5 5 5   Recall 3 1 1   Language- name 2 objects 2 2 2   Language- repeat 1 1 1   Language- follow 3 step command 3 3 3   Language- read & follow direction 1 1 1   Write a sentence 1 1 1   Copy design 1 1 1   Total score 30 28 28      6CIT Screen 07/11/2019  What Year? 0 points  What month? 0 points  What time? 0 points  Count back from 20 0 points  Months in reverse 0 points  Repeat phrase 2 points  Total Score 2    Immunizations Immunization History  Administered Date(s) Administered  . Fluad Quad(high Dose 65+) 12/23/2018, 01/25/2020  . Influenza Split 03/10/2011, 01/08/2012  . Influenza Whole 02/07/2008, 02/05/2009,  02/18/2010  . Influenza, High Dose Seasonal PF 01/25/2013, 03/06/2015, 01/16/2017, 01/08/2018  . Influenza,inj,Quad PF,6+ Mos 01/25/2014, 01/03/2016  . PFIZER(Purple Top)SARS-COV-2 Vaccination 05/14/2019, 06/06/2019, 02/11/2020  . Pneumococcal Conjugate-13 02/08/2013  . Pneumococcal Polysaccharide-23 12/29/1997, 01/08/2012  . Td 07/29/1996, 07/17/2008    TDAP status: Due, Education has been provided regarding the importance of this vaccine. Advised may receive this vaccine at local pharmacy or Health Dept. Aware to provide a copy of the vaccination record if obtained from local pharmacy or Health Dept. Verbalized acceptance and understanding.  Flu Vaccine status: Up to date  Pneumococcal vaccine status: Up to date  Covid-19 vaccine status: Completed vaccines  Qualifies for Shingles Vaccine? Yes   Zostavax completed No   Shingrix Completed?: No.    Education has been provided regarding the importance of this vaccine. Patient has been advised to call insurance company to determine out of pocket expense if they have not yet received this vaccine. Advised may also receive vaccine at local pharmacy or Health Dept. Verbalized acceptance and understanding.  Screening Tests Health Maintenance  Topic Date Due  . Pneumococcal Vaccine 76-79 Years old (1 of 4 - PCV13) Never done  . Zoster Vaccines- Shingrix (1 of 2) Never done  . TETANUS/TDAP  09/15/2020 (Originally 07/18/2018)  . INFLUENZA VACCINE  10/29/2020  . DEXA SCAN  Completed  . COVID-19 Vaccine  Completed  . PNA vac Low Risk Adult  Completed  . HPV VACCINES  Aged Out  Health Maintenance  Health Maintenance Due  Topic Date Due  . Pneumococcal Vaccine 4-33 Years old (1 of 4 - PCV13) Never done  . Zoster Vaccines- Shingrix (1 of 2) Never done    Colorectal cancer screening: No longer required.   Mammogram status: No longer required due to patient refusal.  Bone Density status: Completed 05/20/2016. Results reflect: Bone density  results: OSTEOPENIA. Repeat every 2 years.  Lung Cancer Screening: (Low Dose CT Chest recommended if Age 25-80 years, 30 pack-year currently smoking OR have quit w/in 15years.) does not qualify.   Lung Cancer Screening Referral: no  Additional Screening:  Hepatitis C Screening: does not qualify; Completed no  Vision Screening: Recommended annual ophthalmology exams for early detection of glaucoma and other disorders of the eye. Is the patient up to date with their annual eye exam?  Yes  Who is the provider or what is the name of the office in which the patient attends annual eye exams? Rutherford Guys, MD. If pt is not established with a provider, would they like to be referred to a provider to establish care? No .   Dental Screening: Recommended annual dental exams for proper oral hygiene  Community Resource Referral / Chronic Care Management: CRR required this visit?  No   CCM required this visit?  No      Plan:     I have personally reviewed and noted the following in the patient's chart:   . Medical and social history . Use of alcohol, tobacco or illicit drugs  . Current medications and supplements including opioid prescriptions.  . Functional ability and status . Nutritional status . Physical activity . Advanced directives . List of other physicians . Hospitalizations, surgeries, and ER visits in previous 12 months . Vitals . Screenings to include cognitive, depression, and falls . Referrals and appointments  In addition, I have reviewed and discussed with patient certain preventive protocols, quality metrics, and best practice recommendations. A written personalized care plan for preventive services as well as general preventive health recommendations were provided to patient.     Sheral Flow, LPN   12/01/4266   Nurse Notes:  Medications reviewed with patient; no opioid use noted.

## 2020-08-31 NOTE — Patient Instructions (Signed)
Lindsay Doyle , Thank you for taking time to come for your Medicare Wellness Visit. I appreciate your ongoing commitment to your health goals. Please review the following plan we discussed and let me know if I can assist you in the future.   Screening recommendations/referrals: Colonoscopy: not a candidate for colon cancer screening due to age 87: last done 05/18/2014 Bone Density: last done 05/20/2016 Recommended yearly ophthalmology/optometry visit for glaucoma screening and checkup Recommended yearly dental visit for hygiene and checkup  Vaccinations: Influenza vaccine: 01/25/2020 Pneumococcal vaccine: 01/08/2012, 02/08/2013 Tdap vaccine: 07/17/2008; due every 10 years (can check with local pharmacy for vaccine) Shingles vaccine: never done; can check with local pharmacy for vaccine.   Covid-19: 05/14/2019, 06/06/2019, 02/11/2020  Advanced directives: Please bring a copy of your health care power of attorney and living will to the office at your convenience.  Conditions/risks identified: Yes; Reviewed health maintenance screenings with patient today and relevant education, vaccines, and/or referrals were provided. Please continue to do your personal lifestyle choices by: daily care of teeth and gums, regular physical activity (goal should be 5 days a week for 30 minutes), eat a healthy diet, avoid tobacco and drug use, limiting any alcohol intake, taking a low-dose aspirin (if not allergic or have been advised by your provider otherwise) and taking vitamins and minerals as recommended by your provider. Continue doing brain stimulating activities (puzzles, reading, adult coloring books, staying active) to keep memory sharp. Continue to eat heart healthy diet (full of fruits, vegetables, whole grains, lean protein, water--limit salt, fat, and sugar intake) and increase physical activity as tolerated.  Next appointment: Please schedule your next Medicare Wellness Visit with your Nurse Health  Advisor in 1 year by calling [phone].  Preventive Care 60 Years and Older, Female Preventive care refers to lifestyle choices and visits with your health care provider that can promote health and wellness. What does preventive care include?  A yearly physical exam. This is also called an annual well check.  Dental exams once or twice a year.  Routine eye exams. Ask your health care provider how often you should have your eyes checked.  Personal lifestyle choices, including:  Daily care of your teeth and gums.  Regular physical activity.  Eating a healthy diet.  Avoiding tobacco and drug use.  Limiting alcohol use.  Practicing safe sex.  Taking low-dose aspirin every day.  Taking vitamin and mineral supplements as recommended by your health care provider. What happens during an annual well check? The services and screenings done by your health care provider during your annual well check will depend on your age, overall health, lifestyle risk factors, and family history of disease. Counseling  Your health care provider may ask you questions about your:  Alcohol use.  Tobacco use.  Drug use.  Emotional well-being.  Home and relationship well-being.  Sexual activity.  Eating habits.  History of falls.  Memory and ability to understand (cognition).  Work and work Statistician.  Reproductive health. Screening  You may have the following tests or measurements:  Height, weight, and BMI.  Blood pressure.  Lipid and cholesterol levels. These may be checked every 5 years, or more frequently if you are over 47 years old.  Skin check.  Lung cancer screening. You may have this screening every year starting at age 108 if you have a 30-pack-year history of smoking and currently smoke or have quit within the past 15 years.  Fecal occult blood test (FOBT) of the stool. You may  have this test every year starting at age 45.  Flexible sigmoidoscopy or colonoscopy.  You may have a sigmoidoscopy every 5 years or a colonoscopy every 10 years starting at age 85.  Hepatitis C blood test.  Hepatitis B blood test.  Sexually transmitted disease (STD) testing.  Diabetes screening. This is done by checking your blood sugar (glucose) after you have not eaten for a while (fasting). You may have this done every 1-3 years.  Bone density scan. This is done to screen for osteoporosis. You may have this done starting at age 8.  Mammogram. This may be done every 1-2 years. Talk to your health care provider about how often you should have regular mammograms. Talk with your health care provider about your test results, treatment options, and if necessary, the need for more tests. Vaccines  Your health care provider may recommend certain vaccines, such as:  Influenza vaccine. This is recommended every year.  Tetanus, diphtheria, and acellular pertussis (Tdap, Td) vaccine. You may need a Td booster every 10 years.  Zoster vaccine. You may need this after age 22.  Pneumococcal 13-valent conjugate (PCV13) vaccine. One dose is recommended after age 38.  Pneumococcal polysaccharide (PPSV23) vaccine. One dose is recommended after age 64. Talk to your health care provider about which screenings and vaccines you need and how often you need them. This information is not intended to replace advice given to you by your health care provider. Make sure you discuss any questions you have with your health care provider. Document Released: 04/13/2015 Document Revised: 12/05/2015 Document Reviewed: 01/16/2015 Elsevier Interactive Patient Education  2017 [place] Prevention in the Home Falls can cause injuries. They can happen to people of all ages. There are many things you can do to make your home safe and to help prevent falls. What can I do on the outside of my home?  Regularly fix the edges of walkways and driveways and fix any cracks.  Remove anything that  might make you trip as you walk through a door, such as a raised step or threshold.  Trim any bushes or trees on the path to your home.  Use bright outdoor lighting.  Clear any walking paths of anything that might make someone trip, such as rocks or tools.  Regularly check to see if handrails are loose or broken. Make sure that both sides of any steps have handrails.  Any raised decks and porches should have guardrails on the edges.  Have any leaves, snow, or ice cleared regularly.  Use sand or salt on walking paths during winter.  Clean up any spills in your garage right away. This includes oil or grease spills. What can I do in the bathroom?  Use night lights.  Install grab bars by the toilet and in the tub and shower. Do not use towel bars as grab bars.  Use non-skid mats or decals in the tub or shower.  If you need to sit down in the shower, use a plastic, non-slip stool.  Keep the floor dry. Clean up any water that spills on the floor as soon as it happens.  Remove soap buildup in the tub or shower regularly.  Attach bath mats securely with double-sided non-slip rug tape.  Do not have throw rugs and other things on the floor that can make you trip. What can I do in the bedroom?  Use night lights.  Make sure that you have a light by your bed that is easy  to reach.  Do not use any sheets or blankets that are too big for your bed. They should not hang down onto the floor.  Have a firm chair that has side arms. You can use this for support while you get dressed.  Do not have throw rugs and other things on the floor that can make you trip. What can I do in the kitchen?  Clean up any spills right away.  Avoid walking on wet floors.  Keep items that you use a lot in easy-to-reach places.  If you need to reach something above you, use a strong step stool that has a grab bar.  Keep electrical cords out of the way.  Do not use floor polish or wax that makes floors  slippery. If you must use wax, use non-skid floor wax.  Do not have throw rugs and other things on the floor that can make you trip. What can I do with my stairs?  Do not leave any items on the stairs.  Make sure that there are handrails on both sides of the stairs and use them. Fix handrails that are broken or loose. Make sure that handrails are as long as the stairways.  Check any carpeting to make sure that it is firmly attached to the stairs. Fix any carpet that is loose or worn.  Avoid having throw rugs at the top or bottom of the stairs. If you do have throw rugs, attach them to the floor with carpet tape.  Make sure that you have a light switch at the top of the stairs and the bottom of the stairs. If you do not have them, ask someone to add them for you. What else can I do to help prevent falls?  Wear shoes that:  Do not have high heels.  Have rubber bottoms.  Are comfortable and fit you well.  Are closed at the toe. Do not wear sandals.  If you use a stepladder:  Make sure that it is fully opened. Do not climb a closed stepladder.  Make sure that both sides of the stepladder are locked into place.  Ask someone to hold it for you, if possible.  Clearly mark and make sure that you can see:  Any grab bars or handrails.  First and last steps.  Where the edge of each step is.  Use tools that help you move around (mobility aids) if they are needed. These include:  Canes.  Walkers.  Scooters.  Crutches.  Turn on the lights when you go into a dark area. Replace any light bulbs as soon as they burn out.  Set up your furniture so you have a clear path. Avoid moving your furniture around.  If any of your floors are uneven, fix them.  If there are any pets around you, be aware of where they are.  Review your medicines with your doctor. Some medicines can make you feel dizzy. This can increase your chance of falling. Ask your doctor what other things that you  can do to help prevent falls. This information is not intended to replace advice given to you by your health care provider. Make sure you discuss any questions you have with your health care provider. Document Released: 01/11/2009 Document Revised: 08/23/2015 Document Reviewed: 04/21/2014 Elsevier Interactive Patient Education  2017 [employer].

## 2020-09-02 ENCOUNTER — Other Ambulatory Visit: Payer: Self-pay | Admitting: Cardiovascular Disease

## 2020-09-06 ENCOUNTER — Other Ambulatory Visit: Payer: Self-pay

## 2020-09-06 ENCOUNTER — Other Ambulatory Visit: Payer: Self-pay | Admitting: Internal Medicine

## 2020-09-06 ENCOUNTER — Telehealth: Payer: Self-pay | Admitting: Internal Medicine

## 2020-09-06 MED ORDER — ATORVASTATIN CALCIUM 20 MG PO TABS: 20.0000 mg | ORAL_TABLET | Freq: Every day | ORAL | 0 refills | Status: DC

## 2020-09-06 NOTE — Telephone Encounter (Signed)
Patient is requesting that tiZANidine (ZANAFLEX) 2 MG tablet be taken off her medications list because she cannot take it. Please advise

## 2020-09-17 ENCOUNTER — Other Ambulatory Visit: Payer: Self-pay

## 2020-09-17 ENCOUNTER — Ambulatory Visit (INDEPENDENT_AMBULATORY_CARE_PROVIDER_SITE_OTHER): Payer: Medicare Other | Admitting: Internal Medicine

## 2020-09-17 ENCOUNTER — Encounter: Payer: Self-pay | Admitting: Internal Medicine

## 2020-09-17 VITALS — BP 120/62 | HR 85 | Temp 98.1°F | Ht 64.0 in | Wt 118.0 lb

## 2020-09-17 DIAGNOSIS — D500 Iron deficiency anemia secondary to blood loss (chronic): Secondary | ICD-10-CM | POA: Diagnosis not present

## 2020-09-17 DIAGNOSIS — E538 Deficiency of other specified B group vitamins: Secondary | ICD-10-CM

## 2020-09-17 DIAGNOSIS — R739 Hyperglycemia, unspecified: Secondary | ICD-10-CM | POA: Diagnosis not present

## 2020-09-17 DIAGNOSIS — E559 Vitamin D deficiency, unspecified: Secondary | ICD-10-CM | POA: Diagnosis not present

## 2020-09-17 DIAGNOSIS — N1831 Chronic kidney disease, stage 3a: Secondary | ICD-10-CM | POA: Diagnosis not present

## 2020-09-17 DIAGNOSIS — I519 Heart disease, unspecified: Secondary | ICD-10-CM | POA: Insufficient documentation

## 2020-09-17 DIAGNOSIS — I700 Atherosclerosis of aorta: Secondary | ICD-10-CM

## 2020-09-17 DIAGNOSIS — E782 Mixed hyperlipidemia: Secondary | ICD-10-CM | POA: Diagnosis not present

## 2020-09-17 DIAGNOSIS — M1990 Unspecified osteoarthritis, unspecified site: Secondary | ICD-10-CM | POA: Insufficient documentation

## 2020-09-17 LAB — LIPID PANEL
Cholesterol: 287 mg/dL — ABNORMAL HIGH (ref 0–200)
HDL: 69.9 mg/dL (ref >39.00)
NonHDL: 217.53
Total CHOL/HDL Ratio: 4
Triglycerides: 236 mg/dL — ABNORMAL HIGH (ref 0.0–149.0)
VLDL: 47.2 mg/dL — ABNORMAL HIGH (ref 0.0–40.0)

## 2020-09-17 LAB — URINALYSIS, ROUTINE W REFLEX MICROSCOPIC
Bilirubin Urine: NEGATIVE
Ketones, ur: NEGATIVE
Leukocytes,Ua: NEGATIVE
Nitrite: NEGATIVE
Specific Gravity, Urine: <=1.005 — AB (ref 1.000–1.030)
Total Protein, Urine: NEGATIVE
Urine Glucose: NEGATIVE
Urobilinogen, UA: 0.2 (ref 0.0–1.0)
WBC, UA: NONE SEEN (ref 0–?)
pH: 6.5 (ref 5.0–8.0)

## 2020-09-17 LAB — CBC WITH DIFFERENTIAL/PLATELET
Basophils Absolute: 0.1 10*3/uL (ref 0.0–0.1)
Basophils Relative: 0.7 % (ref 0.0–3.0)
Eosinophils Absolute: 0.2 10*3/uL (ref 0.0–0.7)
Eosinophils Relative: 2.2 % (ref 0.0–5.0)
HCT: 38.5 % (ref 36.0–46.0)
Hemoglobin: 13.2 g/dL (ref 12.0–15.0)
Lymphocytes Relative: 41.8 % (ref 12.0–46.0)
Lymphs Abs: 3.2 10*3/uL (ref 0.7–4.0)
MCHC: 34.3 g/dL (ref 30.0–36.0)
MCV: 101 fl — ABNORMAL HIGH (ref 78.0–100.0)
Monocytes Absolute: 0.7 10*3/uL (ref 0.1–1.0)
Monocytes Relative: 9.7 % (ref 3.0–12.0)
Neutro Abs: 3.5 10*3/uL (ref 1.4–7.7)
Neutrophils Relative %: 45.6 % (ref 43.0–77.0)
Platelets: 249 10*3/uL (ref 150.0–400.0)
RBC: 3.82 Mil/uL — ABNORMAL LOW (ref 3.87–5.11)
RDW: 13 % (ref 11.5–15.5)
WBC: 7.6 10*3/uL (ref 4.0–10.5)

## 2020-09-17 LAB — VITAMIN D 25 HYDROXY (VIT D DEFICIENCY, FRACTURES): VITD: 38.39 ng/mL (ref 30.00–100.00)

## 2020-09-17 LAB — IBC PANEL
Iron: 75 ug/dL (ref 42–145)
Saturation Ratios: 17.6 % — ABNORMAL LOW (ref 20.0–50.0)
Transferrin: 304 mg/dL (ref 212.0–360.0)

## 2020-09-17 LAB — BASIC METABOLIC PANEL
BUN: 18 mg/dL (ref 6–23)
CO2: 28 mEq/L (ref 19–32)
Calcium: 10.1 mg/dL (ref 8.4–10.5)
Chloride: 98 mEq/L (ref 96–112)
Creatinine, Ser: 1.04 mg/dL (ref 0.40–1.20)
GFR: 49.11 mL/min — ABNORMAL LOW (ref >60.00)
Glucose, Bld: 89 mg/dL (ref 70–99)
Potassium: 4.6 mEq/L (ref 3.5–5.1)
Sodium: 134 mEq/L — ABNORMAL LOW (ref 135–145)

## 2020-09-17 LAB — HEPATIC FUNCTION PANEL
ALT: 12 U/L (ref 0–35)
AST: 20 U/L (ref 0–37)
Albumin: 4.5 g/dL (ref 3.5–5.2)
Alkaline Phosphatase: 54 U/L (ref 39–117)
Bilirubin, Direct: 0.1 mg/dL (ref 0.0–0.3)
Total Bilirubin: 0.4 mg/dL (ref 0.2–1.2)
Total Protein: 7.9 g/dL (ref 6.0–8.3)

## 2020-09-17 LAB — LDL CHOLESTEROL, DIRECT: Direct LDL: 199 mg/dL

## 2020-09-17 LAB — TSH: TSH: 1.38 u[IU]/mL (ref 0.35–4.50)

## 2020-09-17 LAB — FERRITIN: Ferritin: 35.3 ng/mL (ref 10.0–291.0)

## 2020-09-17 LAB — HEMOGLOBIN A1C: Hgb A1c MFr Bld: 6.1 % (ref 4.6–6.5)

## 2020-09-17 LAB — VITAMIN B12: Vitamin B-12: 1051 pg/mL — ABNORMAL HIGH (ref 211–911)

## 2020-09-17 MED ORDER — ATORVASTATIN CALCIUM 20 MG PO TABS: 20.0000 mg | ORAL_TABLET | Freq: Every day | ORAL | 3 refills | Status: DC

## 2020-09-17 NOTE — Assessment & Plan Note (Signed)
Lab Results  Component Value Date   CREATININE 1.04 09/17/2020   Stable overall, cont to avoid nephrotoxins

## 2020-09-17 NOTE — Patient Instructions (Signed)

## 2020-09-17 NOTE — Assessment & Plan Note (Signed)
To continue diet, exercise, and statin lipitor 20 Lab Results  Component Value Date   LDLCALC 75 09/16/2019

## 2020-09-17 NOTE — Assessment & Plan Note (Signed)
Lab Results  Component Value Date   [place] 75 09/16/2019  Uncontrolled, to continue lipitor though mild uncontrolled, declines increased statin for now

## 2020-09-17 NOTE — Assessment & Plan Note (Signed)
Lab Results  Component Value Date   HGBA1C 6.1 09/17/2020   Stable, pt to continue current medical treatment  - diet  

## 2020-09-17 NOTE — Progress Notes (Signed)
Patient ID: Lindsay Doyle, female   DOB: 04/03/1933, 87 y.o.   MRN: [id number]        Chief Complaint: follow up HTN uncontrolled, HLD and hyperglycemia , ckd, aortic atherosclerosis, irom deficiency       HPI:  Lindsay Doyle is a 87 y.o. female her to f/u overall doing ok, Pt denies chest pain, increased sob or doe, wheezing, orthopnea, PND, increased LE swelling, palpitations, dizziness or syncope.   Pt denies polydipsia, polyuria, or new focal neuro s/s.   Struggling with anxiety and depression with cooped up for 2 ys.       Neck pain improved from last visit.  No overt bleeding recently.  Trying to follow lower cholesterol diet.     Wt Readings from Last 3 Encounters:  09/17/20 118 lb (53.5 kg)  08/31/20 117 lb 12.8 oz (53.4 kg)  08/23/20 116 lb 9.6 oz (52.9 kg)   BP Readings from Last 3 Encounters:  09/17/20 120/62  08/31/20 120/60  08/23/20 138/78         Past Medical History:  Diagnosis Date   ANEMIA-IRON DEFICIENCY    presumed GI bleed 08/2012- anticoag stopped   Atrial fibrillation ([place])    a. Apixaban started 03/2012 - stopped after admx for GI bleed 6/14   Blood transfusion    a. 05/2010: 3 transfusions for anemia/GIB.   CAD (coronary artery disease)    a. LHC 1/14:  mLAD serial 30 and 60%, oRCA 30%, EF 60%, Afib,    Cataract    COLONIC POLYPS, HX OF 03/09/2007   COPD 03/09/2007   ? pt is unsure   GERD (gastroesophageal reflux disease)    GI bleed    thought to be due to AVMs   Hx of echocardiogram    a. echo 2/11:  EF 55-60%, mild AI, mild RAE, mild to mod TR;   b.  a. Echo 2/14:  Mild focal basal and mild LVH, EF 60-65%, mild AI, mild RAE, trivia effusion   HYPERGLYCEMIA 07/02/2007   Hyperlipidemia    Hypertension 11/12/2006   LEUKOPENIA, MILD 05/30/2008   Mitral valve prolapse    Remotely - last echo 2011 did not show this   NSTEMI (non-ST elevated myocardial infarction) ([place]) 03/2012   a. Type II in setting of AF with RVR 03/2012   OSTEOARTHRITIS 02/19/2009    Spinal   OSTEOPOROSIS 11/12/2006   Positive H. pylori test 10/1996   PSVT    a. Remotely, in setting of anemia.   Spinal stenosis of lumbar region 07/16/2012   Transfusion history    7/15    Past Surgical History:  Procedure Laterality Date   ABDOMINAL HYSTERECTOMY     CARDIAC CATHETERIZATION  1992   S/P in 1992 at [facility] in [place]. This was negative for any coronary artery disease   carotid duplex  08/29/2003   CATARACT EXTRACTION     ceasarean     ENTEROSCOPY N/A 10/10/2013   Procedure: ENTEROSCOPY;  Surgeon: Inda Castle, MD;  Location: [facility] ENDOSCOPY;  Service: Endoscopy;  Laterality: N/A;   ENTEROSCOPY N/A 12/28/2014   Procedure: ENTEROSCOPY;  Surgeon: Inda Castle, MD;  Location: [facility] ENDOSCOPY;  Service: Endoscopy;  Laterality: N/A;   ENTEROSCOPY N/A 01/20/2016   Procedure: ENTEROSCOPY;  Surgeon: Manus Gunning, MD;  Location: [facility] ENDOSCOPY;  Service: Gastroenterology;  Laterality: N/A;   HOT HEMOSTASIS N/A 12/28/2014   Procedure: HOT HEMOSTASIS (ARGON PLASMA COAGULATION/BICAP);  Surgeon: Sandy Salaam  Deatra Ina, MD;  Location: [facility] ENDOSCOPY;  Service: Endoscopy;  Laterality: N/A;   LEFT HEART CATHETERIZATION WITH CORONARY ANGIOGRAM N/A 04/23/2012   Procedure: LEFT HEART CATHETERIZATION WITH CORONARY ANGIOGRAM;  Surgeon: Larey Dresser, MD;  Location[facility] CATH LAB;  Service: Cardiovascular;  Laterality: N/A;   TONSILLECTOMY     TUBAL LIGATION      reports that she has never smoked. She has never used smokeless tobacco. She reports that she does not drink alcohol and does not use drugs. family history includes Colon cancer in her brother; Prostate cancer in her brother; Sarcoidosis in her sister and another family member; Stomach cancer in her mother. Allergies  Allergen Reactions   Doxycycline Diarrhea    Possible diarrhea   Biaxin [Clarithromycin] Other (See Comments)    diarrhea   Metronidazole Other (See Comments)    Pt had difficulty swallowing  this and says it was "horrible" to take. No allergic reaction.   Current Outpatient Medications on File Prior to Visit  Medication Sig Dispense Refill   acetaminophen (TYLENOL) 500 MG tablet Take 2 tablets (1,000 mg total) by mouth 2 (two) times daily. 60 tablet 11   ferrous sulfate 325 (65 FE) MG tablet TAKE 1 TABLET BY MOUTH 2 TIMES DAILY WITH A MEAL. 180 tablet 0   metoprolol succinate (TOPROL-XL) 50 MG 24 hr tablet Take 1.5 tablets (75 mg total) by mouth daily. Take with or immediately following a meal. 135 tablet 3   metoprolol tartrate (LOPRESSOR) 25 MG tablet TAKE 1 TABLET (25 MG TOTAL) BY MOUTH DAILY AS NEEDED (PALPITATIONS). 90 tablet 0   Multiple Vitamin (MULTIVITAMIN WITH MINERALS) TABS tablet Take 1 tablet by mouth daily. Centrum Silver     nitroGLYCERIN (NITROSTAT) 0.4 MG SL tablet Place 1 tablet (0.4 mg total) under the tongue every 5 (five) minutes as needed for chest pain (maximum of 3 tablets). 25 tablet 7   tiZANidine (ZANAFLEX) 2 MG tablet Take 1 tablet (2 mg total) by mouth every 6 (six) hours as needed for muscle spasms. 40 tablet 1   No current facility-administered medications on file prior to visit.        ROS:  All others reviewed and negative.  Objective        PE:  BP 120/62 (BP Location: Left Arm, Patient Position: Sitting, Cuff Size: Normal)   Pulse 85   Temp 98.1 F (36.7 C) (Oral)   Ht 5\' 4"  (1.626 m)   Wt 118 lb (53.5 kg)   SpO2 94%   BMI 20.25 kg/m                 Constitutional: Pt appears in NAD               HENT: Head: NCAT.                Right Ear: External ear normal.                 Left Ear: External ear normal.                Eyes: . Pupils are equal, round, and reactive to light. Conjunctivae and EOM are normal               Nose: without d/c or deformity               Neck: Neck supple. Gross normal ROM               Cardiovascular: Normal  rate and regular rhythm.                 Pulmonary/Chest: Effort normal and breath sounds without  rales or wheezing.                Abd:  Soft, NT, ND, + BS, no organomegaly               Neurological: Pt is alert. At baseline orientation, motor grossly intact               Skin: Skin is warm. No rashes, no other new lesions, LE edema - none               Psychiatric: Pt behavior is normal without agitation   Micro: none  Cardiac tracings I have personally interpreted today:  none  Pertinent Radiological findings (summarize): none   Lab Results  Component Value Date   WBC 7.6 09/17/2020   HGB 13.2 09/17/2020   HCT 38.5 09/17/2020   PLT 249.0 09/17/2020   GLUCOSE 89 09/17/2020   CHOL 287 (H) 09/17/2020   TRIG 236.0 (H) 09/17/2020   HDL 69.90 09/17/2020   LDLDIRECT 199.0 09/17/2020   LDLCALC 75 09/16/2019   ALT 12 09/17/2020   AST 20 09/17/2020   NA 134 (L) 09/17/2020   K 4.6 09/17/2020   CL 98 09/17/2020   CREATININE 1.04 09/17/2020   BUN 18 09/17/2020   CO2 28 09/17/2020   TSH 1.38 09/17/2020   INR 1.06 01/20/2016   HGBA1C 6.1 09/17/2020   Assessment/Plan:  Lindsay Doyle is a 87 y.o. Black or African American [2] female with  has a past medical history of ANEMIA-IRON DEFICIENCY, Atrial fibrillation ([place]), Blood transfusion, CAD (coronary artery disease), Cataract, COLONIC POLYPS, HX OF (03/09/2007), COPD (03/09/2007), GERD (gastroesophageal reflux disease), GI bleed, echocardiogram, HYPERGLYCEMIA (07/02/2007), Hyperlipidemia, Hypertension (11/12/2006), LEUKOPENIA, MILD (05/30/2008), Mitral valve prolapse, NSTEMI (non-ST elevated myocardial infarction) ([place]) (03/2012), OSTEOARTHRITIS (02/19/2009), OSTEOPOROSIS (11/12/2006), Positive H. pylori test (10/1996), PSVT, Spinal stenosis of lumbar region (07/16/2012), and Transfusion history.  Aortic atherosclerosis ([place]) To continue diet, exercise, and statin lipitor 20 Lab Results  Component Value Date   LDLCALC 75 09/16/2019      Hyperlipidemia Lab Results  Component Value Date   LDLCALC 75 09/16/2019  Uncontrolled, to  continue lipitor though mild uncontrolled, declines increased statin for now  Iron deficiency anemia No overt bleeding, for iron labs,  to f/u any worsening symptoms or concerns  Hyperglycemia Lab Results  Component Value Date   HGBA1C 6.1 09/17/2020   Stable, pt to continue current medical treatment  - diet   CKD (chronic kidney disease), stage III Lab Results  Component Value Date   CREATININE 1.04 09/17/2020   Stable overall, cont to avoid nephrotoxins  Followup: Return in about 6 months (around 03/19/2021).  Cathlean Cower, MD 09/17/2020 11:01 PM [facility]

## 2020-09-17 NOTE — Assessment & Plan Note (Signed)
No overt bleeding, for iron labs,  to f/u any worsening symptoms or concerns

## 2020-09-24 ENCOUNTER — Other Ambulatory Visit: Payer: Self-pay | Admitting: Cardiovascular Disease

## 2020-10-08 ENCOUNTER — Telehealth: Payer: Self-pay | Admitting: Internal Medicine

## 2020-10-08 NOTE — Telephone Encounter (Signed)
   Patient called and said that yesterday she started experiencing some hearing issues in her left ear. She said that she thinks its clogged. Patient is scheduled for 7/14 with PCP. She is requesting a call back

## 2020-10-08 NOTE — Telephone Encounter (Signed)
Returned patient call and patient phone constantly rang.

## 2020-10-11 ENCOUNTER — Ambulatory Visit: Payer: Medicare Other | Admitting: Internal Medicine

## 2020-10-16 DIAGNOSIS — H6122 Impacted cerumen, left ear: Secondary | ICD-10-CM | POA: Diagnosis not present

## 2020-10-16 DIAGNOSIS — H6123 Impacted cerumen, bilateral: Secondary | ICD-10-CM | POA: Insufficient documentation

## 2020-10-16 DIAGNOSIS — H9012 Conductive hearing loss, unilateral, left ear, with unrestricted hearing on the contralateral side: Secondary | ICD-10-CM | POA: Insufficient documentation

## 2020-11-14 DIAGNOSIS — U071 COVID-19: Secondary | ICD-10-CM | POA: Diagnosis not present

## 2020-12-20 ENCOUNTER — Other Ambulatory Visit: Payer: Self-pay

## 2020-12-20 MED ORDER — NITROGLYCERIN 0.4 MG SL SUBL: 0.4000 mg | SUBLINGUAL_TABLET | SUBLINGUAL | 4 refills | Status: DC | PRN

## 2020-12-20 NOTE — Telephone Encounter (Signed)
Pt's medication was sent to pt's pharmacy as requested. Confirmation received.  °

## 2021-01-01 DIAGNOSIS — H02054 Trichiasis without entropian left upper eyelid: Secondary | ICD-10-CM | POA: Diagnosis not present

## 2021-01-19 ENCOUNTER — Other Ambulatory Visit: Payer: Self-pay

## 2021-01-19 ENCOUNTER — Ambulatory Visit (INDEPENDENT_AMBULATORY_CARE_PROVIDER_SITE_OTHER): Payer: Medicare Other

## 2021-01-19 DIAGNOSIS — Z23 Encounter for immunization: Secondary | ICD-10-CM | POA: Diagnosis not present

## 2021-01-22 ENCOUNTER — Ambulatory Visit: Payer: Medicare Other | Admitting: Internal Medicine

## 2021-02-18 DIAGNOSIS — H6123 Impacted cerumen, bilateral: Secondary | ICD-10-CM | POA: Diagnosis not present

## 2021-03-09 ENCOUNTER — Other Ambulatory Visit: Payer: Self-pay | Admitting: Internal Medicine

## 2021-03-09 ENCOUNTER — Other Ambulatory Visit: Payer: Self-pay | Admitting: Cardiovascular Disease

## 2021-04-12 ENCOUNTER — Encounter: Payer: Self-pay | Admitting: Internal Medicine

## 2021-04-12 ENCOUNTER — Ambulatory Visit (INDEPENDENT_AMBULATORY_CARE_PROVIDER_SITE_OTHER): Payer: Medicare Other | Admitting: Internal Medicine

## 2021-04-12 ENCOUNTER — Other Ambulatory Visit: Payer: Self-pay

## 2021-04-12 VITALS — BP 122/78 | HR 53 | Temp 97.8°F | Ht 64.0 in | Wt 113.0 lb

## 2021-04-12 DIAGNOSIS — I700 Atherosclerosis of aorta: Secondary | ICD-10-CM | POA: Diagnosis not present

## 2021-04-12 DIAGNOSIS — E538 Deficiency of other specified B group vitamins: Secondary | ICD-10-CM | POA: Diagnosis not present

## 2021-04-12 DIAGNOSIS — J449 Chronic obstructive pulmonary disease, unspecified: Secondary | ICD-10-CM | POA: Diagnosis not present

## 2021-04-12 DIAGNOSIS — R739 Hyperglycemia, unspecified: Secondary | ICD-10-CM | POA: Diagnosis not present

## 2021-04-12 DIAGNOSIS — I10 Essential (primary) hypertension: Secondary | ICD-10-CM

## 2021-04-12 DIAGNOSIS — N1831 Chronic kidney disease, stage 3a: Secondary | ICD-10-CM | POA: Diagnosis not present

## 2021-04-12 DIAGNOSIS — E559 Vitamin D deficiency, unspecified: Secondary | ICD-10-CM | POA: Diagnosis not present

## 2021-04-12 DIAGNOSIS — E782 Mixed hyperlipidemia: Secondary | ICD-10-CM | POA: Diagnosis not present

## 2021-04-12 LAB — URINALYSIS, ROUTINE W REFLEX MICROSCOPIC
Bilirubin Urine: NEGATIVE
Ketones, ur: NEGATIVE
Leukocytes,Ua: NEGATIVE
Nitrite: NEGATIVE
Specific Gravity, Urine: <=1.005 — AB (ref 1.000–1.030)
Total Protein, Urine: NEGATIVE
Urine Glucose: NEGATIVE
Urobilinogen, UA: 0.2 (ref 0.0–1.0)
pH: 6 (ref 5.0–8.0)

## 2021-04-12 LAB — CBC WITH DIFFERENTIAL/PLATELET
Basophils Absolute: 0 10*3/uL (ref 0.0–0.1)
Basophils Relative: 0.6 % (ref 0.0–3.0)
Eosinophils Absolute: 0.1 10*3/uL (ref 0.0–0.7)
Eosinophils Relative: 2.6 % (ref 0.0–5.0)
HCT: 38.7 % (ref 36.0–46.0)
Hemoglobin: 12.9 g/dL (ref 12.0–15.0)
Lymphocytes Relative: 45.4 % (ref 12.0–46.0)
Lymphs Abs: 2.5 10*3/uL (ref 0.7–4.0)
MCHC: 33.3 g/dL (ref 30.0–36.0)
MCV: 102.1 fl — ABNORMAL HIGH (ref 78.0–100.0)
Monocytes Absolute: 0.7 10*3/uL (ref 0.1–1.0)
Monocytes Relative: 12 % (ref 3.0–12.0)
Neutro Abs: 2.1 10*3/uL (ref 1.4–7.7)
Neutrophils Relative %: 39.4 % — ABNORMAL LOW (ref 43.0–77.0)
Platelets: 224 10*3/uL (ref 150.0–400.0)
RBC: 3.79 Mil/uL — ABNORMAL LOW (ref 3.87–5.11)
RDW: 12.6 % (ref 11.5–15.5)
WBC: 5.4 10*3/uL (ref 4.0–10.5)

## 2021-04-12 LAB — BASIC METABOLIC PANEL
BUN: 17 mg/dL (ref 6–23)
CO2: 29 mEq/L (ref 19–32)
Calcium: 9.9 mg/dL (ref 8.4–10.5)
Chloride: 100 mEq/L (ref 96–112)
Creatinine, Ser: 1.07 mg/dL (ref 0.40–1.20)
GFR: 47.28 mL/min — ABNORMAL LOW (ref >60.00)
Glucose, Bld: 98 mg/dL (ref 70–99)
Potassium: 4.5 mEq/L (ref 3.5–5.1)
Sodium: 138 mEq/L (ref 135–145)

## 2021-04-12 LAB — HEPATIC FUNCTION PANEL
ALT: 14 U/L (ref 0–35)
AST: 19 U/L (ref 0–37)
Albumin: 4.6 g/dL (ref 3.5–5.2)
Alkaline Phosphatase: 56 U/L (ref 39–117)
Bilirubin, Direct: 0.1 mg/dL (ref 0.0–0.3)
Total Bilirubin: 0.4 mg/dL (ref 0.2–1.2)
Total Protein: 7.8 g/dL (ref 6.0–8.3)

## 2021-04-12 LAB — LIPID PANEL
Cholesterol: 186 mg/dL (ref 0–200)
HDL: 75.5 mg/dL (ref >39.00)
LDL Cholesterol: 81 mg/dL (ref 0–99)
NonHDL: 110.09
Total CHOL/HDL Ratio: 2
Triglycerides: 145 mg/dL (ref 0.0–149.0)
VLDL: 29 mg/dL (ref 0.0–40.0)

## 2021-04-12 LAB — HEMOGLOBIN A1C: Hgb A1c MFr Bld: 6.1 % (ref 4.6–6.5)

## 2021-04-12 LAB — TSH: TSH: 1.16 u[IU]/mL (ref 0.35–5.50)

## 2021-04-12 LAB — VITAMIN B12: Vitamin B-12: 953 pg/mL — ABNORMAL HIGH (ref 211–911)

## 2021-04-12 LAB — VITAMIN D 25 HYDROXY (VIT D DEFICIENCY, FRACTURES): VITD: 40.12 ng/mL (ref 30.00–100.00)

## 2021-04-12 NOTE — Assessment & Plan Note (Signed)
Last vitamin D Lab Results  Component Value Date   VD25OH 38.39 09/17/2020   Low, to start oral replacement

## 2021-04-12 NOTE — Progress Notes (Signed)
Patient ID: Lindsay Doyle, female   DOB: 1934-03-06, 88 y.o.   MRN: [id number]         Chief Complaint:: yearly exam       HPI:  Lindsay Doyle is a 88 y.o. female overall doing ok, Pt denies chest pain, increased sob or doe, wheezing, orthopnea, PND, increased LE swelling, palpitations, dizziness or syncope.   Pt denies polydipsia, polyuria, or new focal neuro s/s.   Pt denies fever, wt loss, night sweats, loss of appetite, or other constitutional symptoms          Not taking Vit D                    Wt Readings from Last 3 Encounters:  04/12/21 113 lb (51.3 kg)  09/17/20 118 lb (53.5 kg)  08/31/20 117 lb 12.8 oz (53.4 kg)   BP Readings from Last 3 Encounters:  04/12/21 122/78  09/17/20 120/62  08/31/20 120/60   Immunization History  Administered Date(s) Administered   Fluad Quad(high Dose 65+) 12/23/2018, 01/25/2020, 01/19/2021   Influenza Split 03/10/2011, 01/08/2012   Influenza Whole 02/07/2008, 02/05/2009, 02/18/2010   Influenza, High Dose Seasonal PF 01/25/2013, 03/06/2015, 01/16/2017, 01/08/2018   Influenza,inj,Quad PF,6+ Mos 01/25/2014, 01/03/2016   PFIZER Comirnaty(Gray Top)Covid-19 Tri-Sucrose Vaccine 07/30/2020   PFIZER(Purple Top)SARS-COV-2 Vaccination 05/14/2019, 06/06/2019, 02/11/2020   Pneumococcal Conjugate-13 02/08/2013   Pneumococcal Polysaccharide-23 12/29/1997, 01/08/2012   Td 07/29/1996, 07/17/2008   There are no preventive care reminders to display for this patient.     Past Medical History:  Diagnosis Date   ANEMIA-IRON DEFICIENCY    presumed GI bleed 08/2012- anticoag stopped   Atrial fibrillation ([place])    a. Apixaban started 03/2012 - stopped after admx for GI bleed 6/14   Blood transfusion    a. 05/2010: 3 transfusions for anemia/GIB.   CAD (coronary artery disease)    a. LHC 1/14:  mLAD serial 30 and 60%, oRCA 30%, EF 60%, Afib,    Cataract    COLONIC POLYPS, HX OF 03/09/2007   COPD 03/09/2007   ? pt is unsure   GERD (gastroesophageal reflux  disease)    GI bleed    thought to be due to AVMs   Hx of echocardiogram    a. echo 2/11:  EF 55-60%, mild AI, mild RAE, mild to mod TR;   b.  a. Echo 2/14:  Mild focal basal and mild LVH, EF 60-65%, mild AI, mild RAE, trivia effusion   HYPERGLYCEMIA 07/02/2007   Hyperlipidemia    Hypertension 11/12/2006   LEUKOPENIA, MILD 05/30/2008   Mitral valve prolapse    Remotely - last echo 2011 did not show this   NSTEMI (non-ST elevated myocardial infarction) ([place]) 03/2012   a. Type II in setting of AF with RVR 03/2012   OSTEOARTHRITIS 02/19/2009   Spinal   OSTEOPOROSIS 11/12/2006   Positive H. pylori test 10/1996   PSVT    a. Remotely, in setting of anemia.   Spinal stenosis of lumbar region 07/16/2012   Transfusion history    7/15    Past Surgical History:  Procedure Laterality Date   ABDOMINAL HYSTERECTOMY     CARDIAC CATHETERIZATION  1992   S/P in 1992 at [facility] in [place]. This was negative for any coronary artery disease   carotid duplex  08/29/2003   CATARACT EXTRACTION     ceasarean     ENTEROSCOPY N/A 10/10/2013   Procedure: ENTEROSCOPY;  Surgeon: Herbie Baltimore  Shaaron Adler, MD;  Location: [facility];  Service: Endoscopy;  Laterality: N/A;   ENTEROSCOPY N/A 12/28/2014   Procedure: ENTEROSCOPY;  Surgeon: Inda Castle, MD;  Location: [facility] ENDOSCOPY;  Service: Endoscopy;  Laterality: N/A;   ENTEROSCOPY N/A 01/20/2016   Procedure: ENTEROSCOPY;  Surgeon: Manus Gunning, MD;  Location: [facility] ENDOSCOPY;  Service: Gastroenterology;  Laterality: N/A;   HOT HEMOSTASIS N/A 12/28/2014   Procedure: HOT HEMOSTASIS (ARGON PLASMA COAGULATION/BICAP);  Surgeon: Inda Castle, MD;  Location: [facility]OS[facility];  Service: Endoscopy;  Laterality: N/A;   LEFT HEART CATHETERIZATION WITH CORONARY ANGIOGRAM N/A 04/23/2012   Procedure: LEFT HEART CATHETERIZATION WITH CORONARY ANGIOGRAM;  Surgeon: Larey Dresser, MD;  Location[facility] CATH LAB;  Service: Cardiovascular;  Laterality: N/A;    TONSILLECTOMY     TUBAL LIGATION      reports that she has never smoked. She has never used smokeless tobacco. She reports that she does not drink alcohol and does not use drugs. family history includes Colon cancer in her brother; Prostate cancer in her brother; Sarcoidosis in her sister and another family member; Stomach cancer in her mother. Allergies  Allergen Reactions   Doxycycline Diarrhea    Possible diarrhea   Biaxin [Clarithromycin] Other (See Comments)    diarrhea   Metronidazole Other (See Comments)    Pt had difficulty swallowing this and says it was "horrible" to take. No allergic reaction.   Current Outpatient Medications on File Prior to Visit  Medication Sig Dispense Refill   acetaminophen (TYLENOL) 500 MG tablet Take 2 tablets (1,000 mg total) by mouth 2 (two) times daily. 60 tablet 11   atorvastatin (LIPITOR) 20 MG tablet Take 1 tablet (20 mg total) by mouth daily. 90 tablet 3   ferrous sulfate 325 (65 FE) MG tablet TAKE 1 TABLET BY MOUTH 2 TIMES DAILY WITH A MEAL. 180 tablet 0   metoprolol succinate (TOPROL-XL) 50 MG 24 hr tablet TAKE 1.5 TABLETS (75 MG TOTAL) BY MOUTH DAILY. TAKE WITH OR IMMEDIATELY FOLLOWING A MEAL. 135 tablet 0   metoprolol tartrate (LOPRESSOR) 25 MG tablet TAKE 1 TABLET (25 MG TOTAL) BY MOUTH DAILY AS NEEDED (PALPITATIONS). 90 tablet 2   Multiple Vitamin (MULTIVITAMIN WITH MINERALS) TABS tablet Take 1 tablet by mouth daily. Centrum Silver     nitroGLYCERIN (NITROSTAT) 0.4 MG SL tablet Place 1 tablet (0.4 mg total) under the tongue every 5 (five) minutes as needed for chest pain (maximum of 3 tablets). 25 tablet 4   tiZANidine (ZANAFLEX) 2 MG tablet Take 1 tablet (2 mg total) by mouth every 6 (six) hours as needed for muscle spasms. 40 tablet 1   No current facility-administered medications on file prior to visit.        ROS:  All others reviewed and negative.  Objective        PE:  BP 122/78 (BP Location: Right Arm, Patient Position: Sitting,  Cuff Size: Normal)    Pulse (!) 53    Temp 97.8 F (36.6 C) (Oral)    Ht 5\' 4"  (1.626 m)    Wt 113 lb (51.3 kg)    SpO2 96%    BMI 19.40 kg/m                 Constitutional: Pt appears in NAD               HENT: Head: NCAT.                Right Ear: External  ear normal.                 Left Ear: External ear normal.                Eyes: . Pupils are equal, round, and reactive to light. Conjunctivae and EOM are normal               Nose: without d/c or deformity               Neck: Neck supple. Gross normal ROM               Cardiovascular: Normal rate and regular rhythm.                 Pulmonary/Chest: Effort normal and breath sounds without rales or wheezing.                Abd:  Soft, NT, ND, + BS, no organomegaly               Neurological: Pt is alert. At baseline orientation, motor grossly intact               Skin: Skin is warm. No rashes, no other new lesions, LE edema - none               Psychiatric: Pt behavior is normal without agitation   Micro: none  Cardiac tracings I have personally interpreted today:  none  Pertinent Radiological findings (summarize): none   Lab Results  Component Value Date   WBC 5.4 04/12/2021   HGB 12.9 04/12/2021   HCT 38.7 04/12/2021   PLT 224.0 04/12/2021   GLUCOSE 98 04/12/2021   CHOL 186 04/12/2021   TRIG 145.0 04/12/2021   HDL 75.50 04/12/2021   LDLDIRECT 199.0 09/17/2020   LDLCALC 81 04/12/2021   ALT 14 04/12/2021   AST 19 04/12/2021   NA 138 04/12/2021   K 4.5 04/12/2021   CL 100 04/12/2021   CREATININE 1.07 04/12/2021   BUN 17 04/12/2021   CO2 29 04/12/2021   TSH 1.16 04/12/2021   INR 1.06 01/20/2016   HGBA1C 6.1 04/12/2021   Assessment/Plan:  Zynia B. Couillard is a 88 y.o. Black or African American [2] female with  has a past medical history of ANEMIA-IRON DEFICIENCY, Atrial fibrillation ([place]), Blood transfusion, CAD (coronary artery disease), Cataract, COLONIC POLYPS, HX OF (03/09/2007), COPD (03/09/2007), GERD  (gastroesophageal reflux disease), GI bleed, echocardiogram, HYPERGLYCEMIA (07/02/2007), Hyperlipidemia, Hypertension (11/12/2006), LEUKOPENIA, MILD (05/30/2008), Mitral valve prolapse, NSTEMI (non-ST elevated myocardial infarction) ([place]) (03/2012), OSTEOARTHRITIS (02/19/2009), OSTEOPOROSIS (11/12/2006), Positive H. pylori test (10/1996), PSVT, Spinal stenosis of lumbar region (07/16/2012), and Transfusion history.  Vitamin D deficiency Last vitamin D Lab Results  Component Value Date   VD25OH 38.39 09/17/2020   Low, to start oral replacement  Hyperlipidemia 2022 June - LDL 199 -   Uncontrolled, pt has restarted lipitor, for f/u labs  Aortic atherosclerosis (HCC) Pt to continue lipitor, exercise, low chol diet  COPD (chronic obstructive pulmonary disease) (HCC) Stable, cont current in haler prn,  to f/u any worsening symptoms or concerns  CKD (chronic kidney disease), stage III Lab Results  Component Value Date   CREATININE 1.07 04/12/2021   Stable overall, cont to avoid nephrotoxins   Essential hypertension BP Readings from Last 3 Encounters:  04/12/21 122/78  09/17/20 120/62  08/31/20 120/60   Stable, pt to continue medical treatment toprol   Hyperglycemia Lab Results  Component Value Date   HGBA1C 6.1  04/12/2021   Stable, pt to continue current medical treatment  - diet  Followup: Return in about 6 months (around 10/10/2021).  Cathlean Cower, MD 04/13/2021 5:09 PM [facility]

## 2021-04-12 NOTE — Patient Instructions (Addendum)
Please take OTC Vitamin D3 at 2000 units per day, indefinitely  Please continue all other medications as before, and refills have been done if requested.  Please have the pharmacy call with any other refills you may need.  Please continue your efforts at being more active, low cholesterol diet, and weight control.  You are otherwise up to date with prevention measures today.  Please keep your appointments with your specialists as you may have planned  Please go to the LAB at the blood drawing area for the tests to be done  You will be contacted by phone if any changes need to be made immediately.  Otherwise, you will receive a letter about your results with an explanation, but please check with MyChart first.  Please remember to sign up for MyChart if you have not done so, as this will be important to you in the future with finding out test results, communicating by private email, and scheduling acute appointments online when needed.  Please make an Appointment to return in 6 months, or sooner if needed 

## 2021-04-12 NOTE — Assessment & Plan Note (Signed)
2022 June - LDL 199 -   Uncontrolled, pt has restarted lipitor, for f/u labs

## 2021-04-12 NOTE — Assessment & Plan Note (Signed)
Pt to continue lipitor, exercise, low chol diet

## 2021-04-13 ENCOUNTER — Encounter: Payer: Self-pay | Admitting: Internal Medicine

## 2021-04-13 NOTE — Assessment & Plan Note (Signed)
Stable, cont current in haler prn,  to f/u any worsening symptoms or concerns

## 2021-04-13 NOTE — Assessment & Plan Note (Signed)
Lab Results  Component Value Date   CREATININE 1.07 04/12/2021   Stable overall, cont to avoid nephrotoxins

## 2021-04-13 NOTE — Assessment & Plan Note (Signed)
BP Readings from Last 3 Encounters:  04/12/21 122/78  09/17/20 120/62  08/31/20 120/60   Stable, pt to continue medical treatment toprol

## 2021-04-13 NOTE — Assessment & Plan Note (Signed)
Lab Results  Component Value Date   HGBA1C 6.1 04/12/2021   Stable, pt to continue current medical treatment  - diet

## 2021-05-13 ENCOUNTER — Other Ambulatory Visit: Payer: Self-pay | Admitting: Cardiovascular Disease

## 2021-05-14 ENCOUNTER — Other Ambulatory Visit: Payer: Self-pay | Admitting: Cardiovascular Disease

## 2021-05-14 MED ORDER — METOPROLOL SUCCINATE ER 50 MG PO TB24: 75.0000 mg | ORAL_TABLET | Freq: Every day | ORAL | 0 refills | Status: DC

## 2021-05-14 MED ORDER — METOPROLOL SUCCINATE ER 50 MG PO TB24: 50.0000 mg | ORAL_TABLET | Freq: Every day | ORAL | 0 refills | Status: DC

## 2021-05-14 NOTE — Addendum Note (Signed)
Addended by: Carter Kitten D on: 05/14/2021 04:21 PM   Modules accepted: Orders

## 2021-05-14 NOTE — Telephone Encounter (Signed)
Pt's medication was resent to pt's pharmacy as requested. Confirmation received.  °

## 2021-06-13 ENCOUNTER — Encounter: Payer: Self-pay | Admitting: Internal Medicine

## 2021-06-13 ENCOUNTER — Ambulatory Visit (INDEPENDENT_AMBULATORY_CARE_PROVIDER_SITE_OTHER): Payer: Medicare Other | Admitting: Internal Medicine

## 2021-06-13 ENCOUNTER — Other Ambulatory Visit: Payer: Self-pay

## 2021-06-13 VITALS — BP 100/60 | HR 68 | Ht 64.0 in | Wt 113.8 lb

## 2021-06-13 DIAGNOSIS — N1831 Chronic kidney disease, stage 3a: Secondary | ICD-10-CM

## 2021-06-13 DIAGNOSIS — E782 Mixed hyperlipidemia: Secondary | ICD-10-CM

## 2021-06-13 DIAGNOSIS — R739 Hyperglycemia, unspecified: Secondary | ICD-10-CM

## 2021-06-13 DIAGNOSIS — F411 Generalized anxiety disorder: Secondary | ICD-10-CM

## 2021-06-13 MED ORDER — CITALOPRAM HYDROBROMIDE 10 MG PO TABS: 10.0000 mg | ORAL_TABLET | Freq: Every day | ORAL | 3 refills | Status: DC

## 2021-06-13 NOTE — Patient Instructions (Signed)
Please take all new medication as prescribed - the celexa 10 mg per day ? ?You will be contacted regarding the referral for: counseling ? ?Please continue all other medications as before, and refills have been done if requested. ? ?Please have the pharmacy call with any other refills you may need. ? ?Please keep your appointments with your specialists as you may have planned ? ? ? ? ?

## 2021-06-13 NOTE — Progress Notes (Signed)
Patient ID: Lindsay Doyle, female   DOB: October 22, 1933, 88 y.o.   MRN: [id number] ? ? ? ?    Chief Complaint: follow up worsening anxiety, ckd, hyperglyceia, hld ? ?     HPI:  Lindsay Doyle is a 88 y.o. female here overall doing ok, but c/o marked worsening anxiety in the past month for unclear reason;  has marked anxiety with crossing the street, elevator use and even leaving the house.  Pt denies chest pain, increased sob or doe, wheezing, orthopnea, PND, increased LE swelling, palpitations, dizziness or syncope.   Pt denies polydipsia, polyuria, or new focal neuro s/s.    Pt denies fever, wt loss, night sweats, loss of appetite, or other constitutional symptoms  Trying to follow lower chol diet.  Pt is amenable to referral for counseling ?      ?Wt Readings from Last 3 Encounters:  ?06/13/21 113 lb 12.8 oz (51.6 kg)  ?04/12/21 113 lb (51.3 kg)  ?09/17/20 118 lb (53.5 kg)  ? ?BP Readings from Last 3 Encounters:  ?06/13/21 100/60  ?04/12/21 122/78  ?09/17/20 120/62  ? ?      ?Past Medical History:  ?Diagnosis Date  ? ANEMIA-IRON DEFICIENCY   ? presumed GI bleed 08/2012- anticoag stopped  ? Atrial fibrillation ([place])   ? a. Apixaban started 03/2012 - stopped after admx for GI bleed 6/14  ? Blood transfusion   ? a. 05/2010: 3 transfusions for anemia/GIB.  ? CAD (coronary artery disease)   ? a. LHC 1/14:  mLAD serial 30 and 60%, oRCA 30%, EF 60%, Afib,   ? Cataract   ? COLONIC POLYPS, HX OF 03/09/2007  ? COPD 03/09/2007  ? ? pt is unsure  ? GERD (gastroesophageal reflux disease)   ? GI bleed   ? thought to be due to AVMs  ? Hx of echocardiogram   ? a. echo 2/11:  EF 55-60%, mild AI, mild RAE, mild to mod TR;   b.  a. Echo 2/14:  Mild focal basal and mild LVH, EF 60-65%, mild AI, mild RAE, trivia effusion  ? HYPERGLYCEMIA 07/02/2007  ? Hyperlipidemia   ? Hypertension 11/12/2006  ? LEUKOPENIA, MILD 05/30/2008  ? Mitral valve prolapse   ? Remotely - last echo 2011 did not show this  ? NSTEMI (non-ST elevated myocardial infarction)  ([place]) 03/2012  ? a. Type II in setting of AF with RVR 03/2012  ? OSTEOARTHRITIS 02/19/2009  ? Spinal  ? OSTEOPOROSIS 11/12/2006  ? Positive H. pylori test 10/1996  ? PSVT   ? a. Remotely, in setting of anemia.  ? Spinal stenosis of lumbar region 07/16/2012  ? Transfusion history   ? 7/15   ? ?Past Surgical History:  ?Procedure Laterality Date  ? ABDOMINAL HYSTERECTOMY    ? CARDIAC CATHETERIZATION  1992  ? S/P in 1992 at [facility] in [place]. This was negative for any coronary artery disease  ? carotid duplex  08/29/2003  ? CATARACT EXTRACTION    ? ceasarean    ? ENTEROSCOPY N/A 10/10/2013  ? Procedure: ENTEROSCOPY;  Surgeon: Inda Castle, MD;  Location: [facility] ENDOSCOPY;  Service: Endoscopy;  Laterality: N/A;  ? ENTEROSCOPY N/A 12/28/2014  ? Procedure: ENTEROSCOPY;  Surgeon: Inda Castle, MD;  Location: [facility] ENDOSCOPY;  Service: Endoscopy;  Laterality: N/A;  ? ENTEROSCOPY N/A 01/20/2016  ? Procedure: ENTEROSCOPY;  Surgeon: Manus Gunning, MD;  Location: [facility] ENDOSCOPY;  Service: Gastroenterology;  Laterality: N/A;  ? HOT  HEMOSTASIS N/A 12/28/2014  ? Procedure: HOT HEMOSTASIS (ARGON PLASMA COAGULATION/BICAP);  Surgeon: Inda Castle, MD;  Location: [facility];  Service: Endoscopy;  Laterality: N/A;  ? LEFT HEART CATHETERIZATION WITH CORONARY ANGIOGRAM N/A 04/23/2012  ? Procedure: LEFT HEART CATHETERIZATION WITH CORONARY ANGIOGRAM;  Surgeon: Larey Dresser, MD;  Location: [facility] CATH LAB;  Service: Cardiovascular;  Laterality: N/A;  ? TONSILLECTOMY    ? TUBAL LIGATION    ? ? reports that she has never smoked. She has never used smokeless tobacco. She reports that she does not drink alcohol and does not use drugs. ?family history includes Colon cancer in her brother; Prostate cancer in her brother; Sarcoidosis in her sister and another family member; Stomach cancer in her mother. ?Allergies  ?Allergen Reactions  ? Doxycycline Diarrhea  ?  Possible diarrhea  ? Biaxin [Clarithromycin] Other (See  Comments)  ?  diarrhea  ? Metronidazole Other (See Comments)  ?  Pt had difficulty swallowing this and says it was "horrible" to take. No allergic reaction.  ? ?Current Outpatient Medications on File Prior to Visit  ?Medication Sig Dispense Refill  ? acetaminophen (TYLENOL) 500 MG tablet Take 2 tablets (1,000 mg total) by mouth 2 (two) times Doyle. 60 tablet 11  ? atorvastatin (LIPITOR) 20 MG tablet Take 1 tablet (20 mg total) by mouth Doyle. 90 tablet 3  ? ferrous sulfate 325 (65 FE) MG tablet TAKE 1 TABLET BY MOUTH 2 TIMES Doyle WITH A MEAL. 180 tablet 0  ? metoprolol succinate (TOPROL-XL) 50 MG 24 hr tablet Take 1.5 tablets (75 mg total) by mouth Doyle. Please keep upcoming appt. In April with Dr. Burt Knack in order to receive further refills. Thank You. 135 tablet 0  ? metoprolol tartrate (LOPRESSOR) 25 MG tablet TAKE 1 TABLET (25 MG TOTAL) BY MOUTH Doyle AS NEEDED (PALPITATIONS). 90 tablet 2  ? Multiple Vitamin (MULTIVITAMIN WITH MINERALS) TABS tablet Take 1 tablet by mouth Doyle. Centrum Silver    ? nitroGLYCERIN (NITROSTAT) 0.4 MG SL tablet Place 1 tablet (0.4 mg total) under the tongue every 5 (five) minutes as needed for chest pain (maximum of 3 tablets). 25 tablet 4  ? ?No current facility-administered medications on file prior to visit.  ? ?     ROS:  All others reviewed and negative. ? ?Objective  ? ?     PE:  BP 100/60 (BP Location: Right Arm, Patient Position: Sitting, Cuff Size: Normal)   Pulse 68   Ht '5\' 4"'$  (1.626 m)   Wt 113 lb 12.8 oz (51.6 kg)   SpO2 95%   BMI 19.53 kg/m?  ? ?              Constitutional: Pt appears in NAD ?              HENT: Head: NCAT.  ?              Right Ear: External ear normal.   ?              Left Ear: External ear normal.  ?              Eyes: . Pupils are equal, round, and reactive to light. Conjunctivae and EOM are normal ?              Nose: without d/c or deformity ?              Neck: Neck supple. Gross normal ROM ?  Cardiovascular: Normal rate and  regular rhythm.   ?              Pulmonary/Chest: Effort normal and breath sounds without rales or wheezing.  ?              Abd:  Soft, NT, ND, + BS, no organomegaly ?              Neurological: Pt is alert. At baseline orientation, motor grossly intact ?              Skin: Skin is warm. No rashes, no other new lesions, LE edema - none ?              Psychiatric: Pt behavior is normal without agitation but 2+ nervous and jittery ? ?Micro: none ? ?Cardiac tracings I have personally interpreted today:  none ? ?Pertinent Radiological findings (summarize): none  ? ?Lab Results  ?Component Value Date  ? WBC 5.4 04/12/2021  ? HGB 12.9 04/12/2021  ? HCT 38.7 04/12/2021  ? PLT 224.0 04/12/2021  ? GLUCOSE 98 04/12/2021  ? CHOL 186 04/12/2021  ? TRIG 145.0 04/12/2021  ? HDL 75.50 04/12/2021  ? LDLDIRECT 199.0 09/17/2020  ? [place] 81 04/12/2021  ? ALT 14 04/12/2021  ? AST 19 04/12/2021  ? NA 138 04/12/2021  ? K 4.5 04/12/2021  ? CL 100 04/12/2021  ? CREATININE 1.07 04/12/2021  ? BUN 17 04/12/2021  ? CO2 29 04/12/2021  ? TSH 1.16 04/12/2021  ? INR 1.06 01/20/2016  ? HGBA1C 6.1 04/12/2021  ? ?Assessment/Plan:  ?Ngina B. Loeffler is a 88 y.o. Black or African American [2] female with  has a past medical history of ANEMIA-IRON DEFICIENCY, Atrial fibrillation ([place]), Blood transfusion, CAD (coronary artery disease), Cataract, COLONIC POLYPS, HX OF (03/09/2007), COPD (03/09/2007), GERD (gastroesophageal reflux disease), GI bleed, echocardiogram, HYPERGLYCEMIA (07/02/2007), Hyperlipidemia, Hypertension (11/12/2006), LEUKOPENIA, MILD (05/30/2008), Mitral valve prolapse, NSTEMI (non-ST elevated myocardial infarction) ([place]) (03/2012), OSTEOARTHRITIS (02/19/2009), OSTEOPOROSIS (11/12/2006), Positive H. pylori test (10/1996), PSVT, Spinal stenosis of lumbar region (07/16/2012), and Transfusion history. ? ?Anxiety state ?With marked worsening recently for unclear reasons, denies worsening depression or SI or HI; ok for celexa 10 qd, and refer  counseling ? ?CKD (chronic kidney disease), stage III ?Lab Results  ?Component Value Date  ? CREATININE 1.07 04/12/2021  ? ?Stable overall, cont to avoid nephrotoxins ? ? ?Hyperglycemia ?Lab Results  ?Component Valu

## 2021-06-14 ENCOUNTER — Telehealth: Payer: Self-pay | Admitting: Internal Medicine

## 2021-06-14 NOTE — Telephone Encounter (Signed)
Pt making provider aware she is going to "trash" citalopram (CELEXA) 10 MG tablet ? ?Pt states she is "not going to take it" because she "is not that sick" ?

## 2021-06-16 ENCOUNTER — Encounter: Payer: Self-pay | Admitting: Internal Medicine

## 2021-06-16 NOTE — Assessment & Plan Note (Signed)
With marked worsening recently for unclear reasons, denies worsening depression or SI or HI; ok for celexa 10 qd, and refer counseling ?

## 2021-06-16 NOTE — Assessment & Plan Note (Signed)
Lab Results  ?Component Value Date  ? CREATININE 1.07 04/12/2021  ? ?Stable overall, cont to avoid nephrotoxins ? ?

## 2021-06-16 NOTE — Assessment & Plan Note (Signed)
Lab Results  ?Component Value Date  ? [place] 81 04/12/2021  ? ?Mild uncontrolled, goal ldl < 70, pt to continue current statin lipitor 20 and lowe chol diet as declines change today ? ?

## 2021-06-16 NOTE — Assessment & Plan Note (Signed)
Lab Results  ?Component Value Date  ? HGBA1C 6.1 04/12/2021  ? ?Stable, pt to continue current medical treatment  - diet ? ?

## 2021-06-28 DIAGNOSIS — Z01419 Encounter for gynecological examination (general) (routine) without abnormal findings: Secondary | ICD-10-CM | POA: Diagnosis not present

## 2021-06-28 DIAGNOSIS — Z6820 Body mass index (BMI) 20.0-20.9, adult: Secondary | ICD-10-CM | POA: Diagnosis not present

## 2021-07-04 ENCOUNTER — Telehealth: Payer: Self-pay | Admitting: *Deleted

## 2021-07-04 NOTE — Chronic Care Management (AMB) (Signed)
?  Care Management  ? ?Note ? ?07/04/2021 ?Name: Lindsay Doyle. Howser MRN: [id number] DOB: 11-Jan-1934 ? ?[name] is a 88 y.o. year old female who is a primary care patient of Biagio Borg, MD. I reached out to [place] by phone today offer care coordination services.  ? ?[name] was given information about care management services today including:  ?Care management services include personalized support from designated clinical staff supervised by her physician, including individualized plan of care and coordination with other care providers ?24/7 contact phone numbers for assistance for urgent and routine care needs. ?The patient may stop care management services at any time by phone call to the office staff. ? ?Patient did not agree to enrollment in care management services and does not wish to consider at this time. ? ?Follow up plan: ?Patient declines engagement by the care management team. Appropriate care team members and provider have been notified via electronic communication.  The care management team is available to follow up with the patient after provider conversation with the patient regarding recommendation for care management engagement and subsequent re-referral to the care management team.  ? ?Laverda Sorenson  ?Care Guide, Embedded Care Coordination ?[place]  Care Management  ?Direct Dial: [phone] ? ?

## 2021-07-11 DIAGNOSIS — Z1231 Encounter for screening mammogram for malignant neoplasm of breast: Secondary | ICD-10-CM | POA: Diagnosis not present

## 2021-07-15 ENCOUNTER — Encounter: Payer: Self-pay | Admitting: Cardiovascular Disease

## 2021-07-15 ENCOUNTER — Ambulatory Visit (INDEPENDENT_AMBULATORY_CARE_PROVIDER_SITE_OTHER): Payer: Medicare Other | Admitting: Cardiovascular Disease

## 2021-07-15 VITALS — BP 142/94 | HR 84 | Ht 64.0 in | Wt 112.4 lb

## 2021-07-15 DIAGNOSIS — I480 Paroxysmal atrial fibrillation: Secondary | ICD-10-CM | POA: Diagnosis not present

## 2021-07-15 DIAGNOSIS — Z8719 Personal history of other diseases of the digestive system: Secondary | ICD-10-CM

## 2021-07-15 NOTE — Patient Instructions (Signed)
Medication Instructions:  Your physician recommends that you continue on your current medications as directed. Please refer to the Current Medication list given to you today.  *If you need a refill on your cardiac medications before your next appointment, please call your pharmacy*   Lab Work: NONE If you have labs (blood work) drawn today and your tests are completely normal, you will receive your results only by: MyChart Message (if you have MyChart) OR A paper copy in the mail If you have any lab test that is abnormal or we need to change your treatment, we will call you to review the results.   Testing/Procedures: NONE   Follow-Up: At [facility], you and your health needs are our priority.  As part of our continuing mission to provide you with exceptional heart care, we have created designated Provider Care Teams.  These Care Teams include your primary Cardiologist (physician) and Advanced Practice Providers (APPs -  Physician Assistants and Nurse Practitioners) who all work together to provide you with the care you need, when you need it.  We recommend signing up for the patient portal called "MyChart".  Sign up information is provided on this After Visit Summary.  MyChart is used to connect with patients for Virtual Visits (Telemedicine).  Patients are able to view lab/test results, encounter notes, upcoming appointments, etc.  Non-urgent messages can be sent to your provider as well.   To learn more about what you can do with MyChart, go to [email or web address].    Your next appointment:   1 year(s)  The format for your next appointment:   In Person  Provider:   Michael Cooper, MD      Important Information About Sugar       

## 2021-07-15 NOTE — Progress Notes (Signed)
?Cardiology Office Note:   ? ?Date:  07/15/2021  ? ?ID:  Laurelle B. Shawnay, Bramel [birth year], MRN [id number] ? ?PCP:  Biagio Borg, MD ?  ?[facility] Providers ?Cardiologist:  Sherren Mocha, MD ?Electrophysiologist:  Vickie Epley, MD    ? ?Referring MD: Biagio Borg, MD  ? ?Chief Complaint  ?Patient presents with  ? Palpitations  ? ? ?History of Present Illness:   ? ?Lindsay Doyle is a 88 y.o. female with a hx of paroxysmal atrial fibrillation and heart palpitations.  The patient had atrial fibrillation in 2014 associated with elevated troponin.  Cardiac catheterization demonstrated mild nonobstructive coronary disease.  The patient has had significant GI bleeding secondary to AVMs and she has declined further anticoagulant drugs. ? ?The patient is here with her husband today. She is doing well. She only reports one episode of tachypalpitations in the past year. She took a metoprolol tartrate and went to bed, woke up feeling fine. She reports no other problems. Denies chest pain, shortness of breath, or edema.  ? ?Past Medical History:  ?Diagnosis Date  ? ANEMIA-IRON DEFICIENCY   ? presumed GI bleed 08/2012- anticoag stopped  ? Atrial fibrillation ([place])   ? a. Apixaban started 03/2012 - stopped after admx for GI bleed 6/14  ? Blood transfusion   ? a. 05/2010: 3 transfusions for anemia/GIB.  ? CAD (coronary artery disease)   ? a. LHC 1/14:  mLAD serial 30 and 60%, oRCA 30%, EF 60%, Afib,   ? Cataract   ? COLONIC POLYPS, HX OF 03/09/2007  ? COPD 03/09/2007  ? ? pt is unsure  ? GERD (gastroesophageal reflux disease)   ? GI bleed   ? thought to be due to AVMs  ? Hx of echocardiogram   ? a. echo 2/11:  EF 55-60%, mild AI, mild RAE, mild to mod TR;   b.  a. Echo 2/14:  Mild focal basal and mild LVH, EF 60-65%, mild AI, mild RAE, trivia effusion  ? HYPERGLYCEMIA 07/02/2007  ? Hyperlipidemia   ? Hypertension 11/12/2006  ? LEUKOPENIA, MILD 05/30/2008  ? Mitral valve prolapse   ? Remotely - last echo 2011 did not show this  ?  NSTEMI (non-ST elevated myocardial infarction) ([place]) 03/2012  ? a. Type II in setting of AF with RVR 03/2012  ? OSTEOARTHRITIS 02/19/2009  ? Spinal  ? OSTEOPOROSIS 11/12/2006  ? Positive H. pylori test 10/1996  ? PSVT   ? a. Remotely, in setting of anemia.  ? Spinal stenosis of lumbar region 07/16/2012  ? Transfusion history   ? 7/15   ? ? ?Past Surgical History:  ?Procedure Laterality Date  ? ABDOMINAL HYSTERECTOMY    ? CARDIAC CATHETERIZATION  1992  ? S/P in 1992 at [facility] in [place]. This was negative for any coronary artery disease  ? carotid duplex  08/29/2003  ? CATARACT EXTRACTION    ? ceasarean    ? ENTEROSCOPY N/A 10/10/2013  ? Procedure: ENTEROSCOPY;  Surgeon: Inda Castle, MD;  Location: [facility] ENDOSCOPY;  Service: Endoscopy;  Laterality: N/A;  ? ENTEROSCOPY N/A 12/28/2014  ? Procedure: ENTEROSCOPY;  Surgeon: Inda Castle, MD;  Location: [facility] ENDOSCOPY;  Service: Endoscopy;  Laterality: N/A;  ? ENTEROSCOPY N/A 01/20/2016  ? Procedure: ENTEROSCOPY;  Surgeon: Manus Gunning, MD;  Location: [facility] ENDOSCOPY;  Service: Gastroenterology;  Laterality: N/A;  ? HOT HEMOSTASIS N/A 12/28/2014  ? Procedure: HOT HEMOSTASIS (ARGON PLASMA COAGULATION/BICAP);  Surgeon: Sandy Salaam  Deatra Ina, MD;  Location: [facility] ENDOS[facility];  Service: Endoscopy;  Laterality: N/A;  ? LEFT HEART CATHETERIZATION WITH CORONARY ANGIOGRAM N/A 04/23/2012  ? Procedure: LEFT HEART CATHETERIZATION WITH CORONARY ANGIOGRAM;  Surgeon: Larey Dresser, MD;  Location: [facility] CATH LAB;  Service: Cardiovascular;  Laterality: N/A;  ? TONSILLECTOMY    ? TUBAL LIGATION    ? ? ?Current Medications: ?Current Meds  ?Medication Sig  ? acetaminophen (TYLENOL) 500 MG tablet Take 2 tablets (1,000 mg total) by mouth 2 (two) times daily.  ? atorvastatin (LIPITOR) 20 MG tablet Take 1 tablet (20 mg total) by mouth daily.  ? metoprolol succinate (TOPROL-XL) 50 MG 24 hr tablet Take 1.5 tablets (75 mg total) by mouth daily. Please keep upcoming appt. In April  with Dr. Burt Knack in order to receive further refills. Thank You.  ? metoprolol tartrate (LOPRESSOR) 25 MG tablet TAKE 1 TABLET (25 MG TOTAL) BY MOUTH DAILY AS NEEDED (PALPITATIONS).  ? Multiple Vitamin (MULTIVITAMIN WITH MINERALS) TABS tablet Take 1 tablet by mouth daily. Centrum Silver  ? nitroGLYCERIN (NITROSTAT) 0.4 MG SL tablet Place 1 tablet (0.4 mg total) under the tongue every 5 (five) minutes as needed for chest pain (maximum of 3 tablets).  ?  ? ?Allergies:   Doxycycline, Biaxin [clarithromycin], and Metronidazole  ? ?Social History  ? ?Socioeconomic History  ? Marital status: Married  ?  Spouse name: Not on file  ? Number of children: 1  ? Years of education: Not on file  ? Highest education level: Not on file  ?Occupational History  ? Occupation: retired  ?  Comment: retired  ?Tobacco Use  ? Smoking status: Never  ? Smokeless tobacco: Never  ?Vaping Use  ? Vaping Use: Never used  ?Substance and Sexual Activity  ? Alcohol use: No  ? Drug use: No  ? Sexual activity: Never  ?Other Topics Concern  ? Not on file  ?Social History Narrative  ? Not on file  ? ?Social Determinants of Health  ? ?Financial Resource Strain: Low Risk   ? Difficulty of Paying Living Expenses: Not hard at all  ?Food Insecurity: No Food Insecurity  ? Worried About Charity fundraiser in the Last Year: Never true  ? Ran Out of Food in the Last Year: Never true  ?Transportation Needs: No Transportation Needs  ? Lack of Transportation (Medical): No  ? Lack of Transportation (Non-Medical): No  ?Physical Activity: Sufficiently Active  ? Days of Exercise per Week: 6 days  ? Minutes of Exercise per Session: 60 min  ?Stress: No Stress Concern Present  ? Feeling of Stress : Not at all  ?Social Connections: Socially Integrated  ? Frequency of Communication with Friends and Family: More than three times a week  ? Frequency of Social Gatherings with Friends and Family: Three times a week  ? Attends Religious Services: More than 4 times per year  ?  Active Member of Clubs or Organizations: Yes  ? Attends Archivist Meetings: More than 4 times per year  ? Marital Status: Married  ?  ? ?Family History: ?The patient's family history includes Colon cancer in her brother; Prostate cancer in her brother; Sarcoidosis in her sister and another family member; Stomach cancer in her mother. There is no history of CAD. ? ?ROS:   ?Please see the history of present illness.    ?All other systems reviewed and are negative. ? ?EKGs/Labs/Other Studies Reviewed:   ? ?The following studies were reviewed today: ?Echo 12/10/19: ?1.  Left ventricular ejection fraction, by estimation, is 60 to 65%. The  ?left ventricle has normal function. The left ventricle has no regional  ?wall motion abnormalities. There is moderate left ventricular hypertrophy  ?of the basal-septal segment. Left  ?ventricular diastolic parameters are consistent with Grade I diastolic  ?dysfunction (impaired relaxation). Elevated left ventricular end-diastolic  ?pressure.  ? 2. Right ventricular systolic function is mildly reduced. The right  ?ventricular size is normal. There is normal pulmonary artery systolic  ?pressure. The estimated right ventricular systolic pressure is 60.4 mmHg.  ? 3. The mitral valve is normal in structure. No evidence of mitral valve  ?regurgitation. No evidence of mitral stenosis.  ? 4. The aortic valve is tricuspid. There is moderate calcification of the  ?aortic valve. There is moderate thickening of the aortic valve. Aortic  ?valve regurgitation is mild. Mild aortic valve stenosis. Aortic  ?regurgitation PHT measures 511 msec. Aortic  ?valve area, by VTI measures 1.70 cm?[name] Aortic valve mean gradient measures  ?28.0 mmHg. Aortic valve Vmax measures 3.15 m/s.  ? 5. The inferior vena cava is normal in size with greater than 50%  ?respiratory variability, suggesting right atrial pressure of 3 mmHg.  ? ?Event monitor 12/19/2019: ?HR 57-197, average 77. ?5 episodes of SVT, longest  lasting 6 beats with a rate of 197bpm. ?Rare ventricular ectopy. ? ?EKG:  EKG is ordered today.  The ekg ordered today demonstrates NSR 84 bpm, within normal limits ? ?Recent Labs: ?04/12/2021: ALT 14; BUN

## 2021-07-18 ENCOUNTER — Ambulatory Visit: Payer: Medicare Other | Admitting: Cardiovascular Disease

## 2021-08-20 ENCOUNTER — Encounter (HOSPITAL_COMMUNITY): Payer: Self-pay | Admitting: Emergency Medicine

## 2021-08-20 ENCOUNTER — Other Ambulatory Visit: Payer: Self-pay

## 2021-08-20 ENCOUNTER — Emergency Department (HOSPITAL_COMMUNITY)
Admission: EM | Admit: 2021-08-20 | Discharge: 2021-08-20 | Disposition: A | Discharge status: Home / Self Care | Payer: Medicare Other | Attending: Emergency Medicine | Admitting: Emergency Medicine

## 2021-08-20 ENCOUNTER — Emergency Department (HOSPITAL_COMMUNITY): Payer: Medicare Other

## 2021-08-20 DIAGNOSIS — J449 Chronic obstructive pulmonary disease, unspecified: Secondary | ICD-10-CM | POA: Diagnosis not present

## 2021-08-20 DIAGNOSIS — H6123 Impacted cerumen, bilateral: Secondary | ICD-10-CM | POA: Diagnosis not present

## 2021-08-20 DIAGNOSIS — Z79899 Other long term (current) drug therapy: Secondary | ICD-10-CM | POA: Diagnosis not present

## 2021-08-20 DIAGNOSIS — I10 Essential (primary) hypertension: Secondary | ICD-10-CM | POA: Diagnosis not present

## 2021-08-20 DIAGNOSIS — R4182 Altered mental status, unspecified: Secondary | ICD-10-CM | POA: Insufficient documentation

## 2021-08-20 LAB — CBC WITH DIFFERENTIAL/PLATELET
Abs Immature Granulocytes: 0.02 10*3/uL (ref 0.00–0.07)
Basophils Absolute: 0 10*3/uL (ref 0.0–0.1)
Basophils Relative: 1 %
Eosinophils Absolute: 0.1 10*3/uL (ref 0.0–0.5)
Eosinophils Relative: 1 %
HCT: 39.1 % (ref 36.0–46.0)
Hemoglobin: 13.3 g/dL (ref 12.0–15.0)
Immature Granulocytes: 0 %
Lymphocytes Relative: 36 %
Lymphs Abs: 1.9 10*3/uL (ref 0.7–4.0)
MCH: 34.1 pg — ABNORMAL HIGH (ref 26.0–34.0)
MCHC: 34 g/dL (ref 30.0–36.0)
MCV: 100.3 fL — ABNORMAL HIGH (ref 80.0–100.0)
Monocytes Absolute: 0.4 10*3/uL (ref 0.1–1.0)
Monocytes Relative: 7 %
Neutro Abs: 2.9 10*3/uL (ref 1.7–7.7)
Neutrophils Relative %: 55 %
Platelets: 230 10*3/uL (ref 150–400)
RBC: 3.9 MIL/uL (ref 3.87–5.11)
RDW: 12.1 % (ref 11.5–15.5)
WBC: 5.2 10*3/uL (ref 4.0–10.5)
nRBC: 0 % (ref 0.0–0.2)

## 2021-08-20 LAB — BASIC METABOLIC PANEL
Anion gap: 12 (ref 5–15)
BUN: 15 mg/dL (ref 8–23)
CO2: 23 mmol/L (ref 22–32)
Calcium: 9.6 mg/dL (ref 8.9–10.3)
Chloride: 101 mmol/L (ref 98–111)
Creatinine, Ser: 1 mg/dL (ref 0.44–1.00)
GFR, Estimated: 55 mL/min — ABNORMAL LOW (ref >60)
Glucose, Bld: 124 mg/dL — ABNORMAL HIGH (ref 70–99)
Potassium: 4.1 mmol/L (ref 3.5–5.1)
Sodium: 136 mmol/L (ref 135–145)

## 2021-08-20 LAB — URINALYSIS, ROUTINE W REFLEX MICROSCOPIC
Bacteria, UA: NONE SEEN
Bilirubin Urine: NEGATIVE
Glucose, UA: NEGATIVE mg/dL
Ketones, ur: NEGATIVE mg/dL
Leukocytes,Ua: NEGATIVE
Nitrite: NEGATIVE
Protein, ur: NEGATIVE mg/dL
Specific Gravity, Urine: 1.006 (ref 1.005–1.030)
pH: 7 (ref 5.0–8.0)

## 2021-08-20 LAB — TROPONIN I (HIGH SENSITIVITY): Troponin I (High Sensitivity): 25 ng/L — ABNORMAL HIGH (ref <18)

## 2021-08-20 MED ORDER — HYDRALAZINE HCL 20 MG/ML IJ SOLN
10.0000 mg | Freq: Once | INTRAMUSCULAR | Status: DC
  Filled 2021-08-20: qty 1

## 2021-08-20 MED ORDER — HYDRALAZINE HCL 20 MG/ML IJ SOLN
10.0000 mg | Freq: Once | INTRAMUSCULAR | Status: AC
Start: 2021-08-20 — End: 2021-08-20
  Administered 2021-08-20: 10 mg via INTRAMUSCULAR
  Filled 2021-08-20: qty 1

## 2021-08-20 NOTE — ED Notes (Signed)
Patient verbalizes understanding of discharge instructions. Opportunity for questioning and answers were provided. Armband removed by staff, pt discharged from ED via wheelchair.  

## 2021-08-20 NOTE — ED Notes (Signed)
Pt to CT

## 2021-08-20 NOTE — ED Notes (Signed)
IV team at bedside 

## 2021-08-20 NOTE — ED Provider Notes (Signed)
[facility] EMERGENCY DEPARTMENT Provider Note   CSN: [id number] Arrival date & time: 08/20/21  1702     History  Chief Complaint  Patient presents with   Hypertension    Lindsay Doyle is a 88 y.o. female.  Pt is a 88 yo female with a pmhx significant for htn, high cholesterol, anemia secondary to gi bleeds, paroxysmal afib (not on thinners due to gi bleeds), copd, and arthritis.  Pt went to ENT today to get her ears cleaned of ear wax.  While there, they noticed that pt was significantly hypertensive.  They sent here here for further eval.  Pt denies cp.  She said she's been feeling tired.  Pt denies f/c.  Pt has not been checking her blood pressures at home.  She has been compliant with her meds.      Home Medications Prior to Admission medications   Medication Sig Start Date End Date Taking? Authorizing Provider  acetaminophen (TYLENOL) 500 MG tablet Take 2 tablets (1,000 mg total) by mouth 2 (two) times daily. 10/05/15   Biagio Borg, MD  atorvastatin (LIPITOR) 20 MG tablet Take 1 tablet (20 mg total) by mouth daily. 09/17/20   Biagio Borg, MD  citalopram (CELEXA) 10 MG tablet Take 1 tablet (10 mg total) by mouth daily. Patient not taking: Reported on 07/15/2021 06/13/21 06/13/22  Biagio Borg, MD  ferrous sulfate 325 (65 FE) MG tablet TAKE 1 TABLET BY MOUTH 2 TIMES DAILY WITH A MEAL. Patient not taking: Reported on 07/15/2021 03/09/21   Biagio Borg, MD  metoprolol succinate (TOPROL-XL) 50 MG 24 hr tablet Take 1.5 tablets (75 mg total) by mouth daily. Please keep upcoming appt. In April with Dr. Burt Knack in order to receive further refills. Thank You. 05/14/21   Sherren Mocha, MD  metoprolol tartrate (LOPRESSOR) 25 MG tablet TAKE 1 TABLET (25 MG TOTAL) BY MOUTH DAILY AS NEEDED (PALPITATIONS). 09/25/20   Sherren Mocha, MD  Multiple Vitamin (MULTIVITAMIN WITH MINERALS) TABS tablet Take 1 tablet by mouth daily. Centrum Silver    [provider]   nitroGLYCERIN (NITROSTAT) 0.4 MG SL tablet Place 1 tablet (0.4 mg total) under the tongue every 5 (five) minutes as needed for chest pain (maximum of 3 tablets). 12/20/20   Sherren Mocha, MD      Allergies    Doxycycline, Biaxin [clarithromycin], and Metronidazole    Review of Systems   Review of Systems  Neurological:  Positive for weakness.   Physical Exam Updated Vital Signs BP 120/82   Pulse 80   Temp (!) 97.4 F (36.3 C) (Oral)   Resp 14   SpO2 100%  Physical Exam Vitals and nursing note reviewed.  Constitutional:      Appearance: Normal appearance.  HENT:     Head: Normocephalic and atraumatic.     Right Ear: External ear normal.     Left Ear: External ear normal.     Nose: Nose normal.     Mouth/Throat:     Mouth: Mucous membranes are moist.     Pharynx: Oropharynx is clear.  Eyes:     Extraocular Movements: Extraocular movements intact.     Conjunctiva/sclera: Conjunctivae normal.     Pupils: Pupils are equal, round, and reactive to light.  Cardiovascular:     Rate and Rhythm: Normal rate and regular rhythm.     Pulses: Normal pulses.     Heart sounds: Normal heart sounds.  Pulmonary:     Effort:  Pulmonary effort is normal.     Breath sounds: Normal breath sounds.  Abdominal:     General: Abdomen is flat. Bowel sounds are normal.     Palpations: Abdomen is soft.  Musculoskeletal:        General: Normal range of motion.     Cervical back: Normal range of motion and neck supple.  Skin:    General: Skin is warm.     Capillary Refill: Capillary refill takes less than 2 seconds.  Neurological:     General: No focal deficit present.     Mental Status: She is alert and oriented to person, place, and time.  Psychiatric:        Mood and Affect: Mood normal.        Behavior: Behavior normal.        Thought Content: Thought content normal.        Judgment: Judgment normal.    ED Results / Procedures / Treatments   Labs (all labs ordered are listed, but  only abnormal results are displayed) Labs Reviewed  BASIC METABOLIC PANEL - Abnormal; Notable for the following components:      Result Value   Glucose, Bld 124 (*)    GFR, Estimated 55 (*)    All other components within normal limits  CBC WITH DIFFERENTIAL/PLATELET - Abnormal; Notable for the following components:   MCV 100.3 (*)    MCH 34.1 (*)    All other components within normal limits  URINALYSIS, ROUTINE W REFLEX MICROSCOPIC - Abnormal; Notable for the following components:   Color, Urine STRAW (*)    Hgb urine dipstick MODERATE (*)    All other components within normal limits  TROPONIN I (HIGH SENSITIVITY) - Abnormal; Notable for the following components:   Troponin I (High Sensitivity) 25 (*)    All other components within normal limits    EKG EKG Interpretation  Date/Time:  Tuesday Aug 20 2021 19:15:57 EDT Ventricular Rate:  81 PR Interval:  160 QRS Duration: 98 QT Interval:  422 QTC Calculation: 490 R Axis:   88 Text Interpretation: Sinus rhythm Borderline right axis deviation Probable left ventricular hypertrophy Nonspecific T abnrm, anterolateral leads Borderline prolonged QT interval No significant change since last tracing Confirmed by Isla Pence [phone]) on 08/20/2021 8:21:11 PM  Radiology CT Head Wo Contrast  Result Date: 08/20/2021 CLINICAL DATA:  Mental status change of unknown cause.  Fatigue. EXAM: CT HEAD WITHOUT CONTRAST TECHNIQUE: Contiguous axial images were obtained from the base of the skull through the vertex without intravenous contrast. RADIATION DOSE REDUCTION: This exam was performed according to the departmental dose-optimization program which includes automated exposure control, adjustment of the mA and/or kV according to patient size and/or use of iterative reconstruction technique. COMPARISON:  08/14/2004 FINDINGS: Brain: Diffuse cerebral atrophy. Ventricular dilatation consistent with central atrophy. Low-attenuation changes in the deep white  matter consistent with small vessel ischemia. No abnormal extra-axial fluid collections. No mass effect or midline shift. Gray-white matter junctions are distinct. Basal cisterns are not effaced. No acute intracranial hemorrhage. Vascular: Intracranial arterial vascular calcifications. Skull: Calvarium appears intact. Sinuses/Orbits: Paranasal sinuses and mastoid air cells are clear. Other: None. IMPRESSION: No acute intracranial abnormalities. Chronic atrophy and small vessel ischemic changes. Electronically Signed   By: Lucienne Capers M.D.   On: 08/20/2021 23:00    Procedures Procedures    Medications Ordered in ED Medications  hydrALAZINE (APRESOLINE) injection 10 mg (10 mg Intramuscular Given 08/20/21 2110)    ED Course/ Medical  Decision Making/ A&P                           Medical Decision Making Amount and/or Complexity of Data Reviewed Labs: ordered. Radiology: ordered.  Risk Prescription drug management.   This patient presents to the ED for concern of hypertension, this involves an extensive number of treatment options, and is a complaint that carries with it a high risk of complications and morbidity.  The differential diagnosis includes mi, electrolyte abn, infection   Co morbidities that complicate the patient evaluation   htn, high cholesterol, anemia secondary to gi bleeds, paroxysmal afib (not on thinners due to gi bleeds), copd, and arthritis   Additional history obtained:  Additional history obtained from epic chart review External records from outside source obtained and reviewed including husband   Lab Tests:  I Ordered, and personally interpreted labs.  The pertinent results include:  ua nl, cbc nl, bmp nl, trop 25 (last trop 51)   Imaging Studies ordered:  I ordered imaging studies including ct head  I independently visualized and interpreted imaging which showed     IMPRESSION:  No acute intracranial abnormalities. Chronic atrophy and small   vessel ischemic changes.   I agree with the radiologist interpretation   Cardiac Monitoring:  The patient was maintained on a cardiac monitor.  I personally viewed and interpreted the cardiac monitored which showed an underlying rhythm of: nsr   Medicines ordered and prescription drug management:  I ordered medication including hydralazine  for htn  Reevaluation of the patient after these medicines showed that the patient improved I have reviewed the patients home medicines and have made adjustments as needed   Critical Interventions:  hydralazine   Problem List / ED Course:  Htn:  bp responded well to hydralazine.  She sees Dr. Burt Knack for her paroxysmal afib.  She is to f/u as an outpatient.  Pt's husband said pt was very worried about going to the ENT today as the last visit was very painful.  The husband also said that pt has been worried about several members of her family.  Pt feels much better now.  She is stable for d/c.  Return if worse.    Reevaluation:  After the interventions noted above, I reevaluated the patient and found that they have :improved   Social Determinants of Health:  Lives at home   Dispostion:  After consideration of the diagnostic results and the patients response to treatment, I feel that the patent would benefit from discharge with outpatient f/u.          Final Clinical Impression(s) / ED Diagnoses Final diagnoses:  Hypertension, unspecified type    Rx / DC Orders ED Discharge Orders     None         Isla Pence, MD 08/20/21 2322

## 2021-08-20 NOTE — ED Triage Notes (Signed)
Patient referred to ED from ENT after patient was found to be hypertensive. Patient complains fatigue that started today. Patient is alert, oriented, and in on apparent distress at this time. Patient denies chest pain, denies shortness of breath, complains of discomfort in bilateral arms.

## 2021-08-20 NOTE — ED Notes (Signed)
NA for bed assignment.  

## 2021-08-20 NOTE — ED Notes (Addendum)
IV team unable to get IV - phleb asked to try and get blood - MD aware - meds changed to IM

## 2021-08-20 NOTE — ED Provider Triage Note (Signed)
Emergency Medicine Provider Triage Evaluation Note  Lindsay Doyle , a 88 y.o. female  was evaluated in triage.  Pt complains of hypertension.  She had bilateral earwax removal at ENTs office just prior to arrival.  She was found to be hypertensive and was sent to the ER.  She has been compliant with her home antihypertensive.  States that her blood pressure does not typically run this high.  She had pressures ranging from the 1 19-417 systolic in the office.  States that she is feeling fatigued and tired all day.  Reports discomfort in both of her arms but denies any chest pain, shortness of breath, headache or blurry vision.  Review of Systems  Positive: Bilateral arm discomfort Negative: Chest pain, shortness of breath, headache, blurry vision  Physical Exam  BP (!) 234/134 (BP Location: Left Leg)   Pulse 90   Temp (!) 97.4 F (36.3 C) (Oral)   Resp 16   SpO2 100%  Gen:   Awake, no distress   Resp:  Normal effort  MSK:   Moves extremities without difficulty  Other:  2+ radial pulse palpated bilaterally.  Moving extremities out difficulty.  Equal grip strength of bilateral upper extremities.  Normal speech  Medical Decision Making  Medically screening exam initiated at 6:21 PM.  Appropriate orders placed.  Lindsay Doyle was informed that the remainder of the evaluation will be completed by another provider, this initial triage assessment does not replace that evaluation, and the importance of remaining in the ED until their evaluation is complete.  Patient hypertensive to 408 systolic.  Informed charge nurse that she will need to be roomed ASAP   Delia Heady, [place] 08/20/21 1822

## 2021-08-20 NOTE — ED Notes (Signed)
Pt denies any pain or SOB at this time. 

## 2021-08-22 ENCOUNTER — Ambulatory Visit (INDEPENDENT_AMBULATORY_CARE_PROVIDER_SITE_OTHER): Payer: Medicare Other | Admitting: Internal Medicine

## 2021-08-22 VITALS — BP 122/70 | HR 91 | Temp 97.8°F | Ht 64.0 in | Wt 113.0 lb

## 2021-08-22 DIAGNOSIS — I10 Essential (primary) hypertension: Secondary | ICD-10-CM | POA: Diagnosis not present

## 2021-08-22 DIAGNOSIS — R739 Hyperglycemia, unspecified: Secondary | ICD-10-CM

## 2021-08-22 DIAGNOSIS — E782 Mixed hyperlipidemia: Secondary | ICD-10-CM

## 2021-08-22 DIAGNOSIS — F411 Generalized anxiety disorder: Secondary | ICD-10-CM

## 2021-08-22 DIAGNOSIS — N1831 Chronic kidney disease, stage 3a: Secondary | ICD-10-CM

## 2021-08-22 NOTE — Progress Notes (Signed)
Patient ID: Ozella Almond B. Swallow, female   DOB: 25-Jun-1933, 88 y.o.   MRN: [id number]        Chief Complaint: follow up HTN, HLD and hyperglycemia, anxiety       HPI:  Lindsay Doyle is a 88 y.o. female here with c/o having seen in ED may 23 -.  Pt went to ENT to get her ears cleaned of ear wax.  While there, they noticed that pt was significantly hypertensive.  They sent her here for further eval.  Pt denies cp.  She said she's been feeling tired.  Pt denies f/c.  Pt has not been checking her blood pressures at home.  She has been compliant with her meds. Tx with hydralazine. Lab and imaging neg for acute. Has had more stress recently. Plans to return for wax removal per ENT every 3-4 mo.  chest pain   Pt denies polydipsia, polyuria, or new focal neuro s/s.    Pt denies fever, wt loss, night sweats, loss of appetite, or other constitutional symptoms  Denies worsening depressive symptoms, suicidal ideation, or panic  Wt Readings from Last 3 Encounters:  08/23/21 112 lb 12.8 oz (51.2 kg)  08/22/21 113 lb (51.3 kg)  07/15/21 112 lb 6.4 oz (51 kg)   BP Readings from Last 3 Encounters:  08/23/21 (!) 154/96  08/22/21 122/70  08/20/21 131/83         Past Medical History:  Diagnosis Date   ANEMIA-IRON DEFICIENCY    presumed GI bleed 08/2012- anticoag stopped   Atrial fibrillation ([place])    a. Apixaban started 03/2012 - stopped after admx for GI bleed 6/14   Blood transfusion    a. 05/2010: 3 transfusions for anemia/GIB.   CAD (coronary artery disease)    a. LHC 1/14:  mLAD serial 30 and 60%, oRCA 30%, EF 60%, Afib,    Cataract    COLONIC POLYPS, HX OF 03/09/2007   COPD 03/09/2007   ? pt is unsure   GERD (gastroesophageal reflux disease)    GI bleed    thought to be due to AVMs   Hx of echocardiogram    a. echo 2/11:  EF 55-60%, mild AI, mild RAE, mild to mod TR;   b.  a. Echo 2/14:  Mild focal basal and mild LVH, EF 60-65%, mild AI, mild RAE, trivia effusion   HYPERGLYCEMIA 07/02/2007    Hyperlipidemia    Hypertension 11/12/2006   LEUKOPENIA, MILD 05/30/2008   Mitral valve prolapse    Remotely - last echo 2011 did not show this   NSTEMI (non-ST elevated myocardial infarction) ([place]) 03/2012   a. Type II in setting of AF with RVR 03/2012   OSTEOARTHRITIS 02/19/2009   Spinal   OSTEOPOROSIS 11/12/2006   Positive H. pylori test 10/1996   PSVT    a. Remotely, in setting of anemia.   Spinal stenosis of lumbar region 07/16/2012   Transfusion history    7/15    Past Surgical History:  Procedure Laterality Date   ABDOMINAL HYSTERECTOMY     CARDIAC CATHETERIZATION  1992   S/P in 1992 at [facility] in [place]. This was negative for any coronary artery disease   carotid duplex  08/29/2003   CATARACT EXTRACTION     ceasarean     ENTEROSCOPY N/A 10/10/2013   Procedure: ENTEROSCOPY;  Surgeon: Inda Castle, MD;  Location: [facility] ENDOSCOPY;  Service: Endoscopy;  Laterality: N/A;   ENTEROSCOPY N/A 12/28/2014   Procedure:  ENTEROSCOPY;  Surgeon: Inda Castle, MD;  Location: [facility];  Service: Endoscopy;  Laterality: N/A;   ENTEROSCOPY N/A 01/20/2016   Procedure: ENTEROSCOPY;  Surgeon: Manus Gunning, MD;  Location: [facility] ENDOSCOPY;  Service: Gastroenterology;  Laterality: N/A;   HOT HEMOSTASIS N/A 12/28/2014   Procedure: HOT HEMOSTASIS (ARGON PLASMA COAGULATION/BICAP);  Surgeon: Inda Castle, MD;  Location: [facility] ENDOSCOPY;  Service: Endoscopy;  Laterality: N/A;   LEFT HEART CATHETERIZATION WITH CORONARY ANGIOGRAM N/A 04/23/2012   Procedure: LEFT HEART CATHETERIZATION WITH CORONARY ANGIOGRAM;  Surgeon: Larey Dresser, MD;  Location: [facility] CATH LAB;  Service: Cardiovascular;  Laterality: N/A;   TONSILLECTOMY     TUBAL LIGATION      reports that she has never smoked. She has never used smokeless tobacco. She reports that she does not drink alcohol and does not use drugs. family history includes Colon cancer in her brother; Prostate cancer in her brother;  Sarcoidosis in her sister and another family member; Stomach cancer in her mother. Allergies  Allergen Reactions   Doxycycline Diarrhea    Possible diarrhea   Biaxin [Clarithromycin] Other (See Comments)    diarrhea   Metronidazole Other (See Comments)    Pt had difficulty swallowing this and says it was "horrible" to take. No allergic reaction.   Current Outpatient Medications on File Prior to Visit  Medication Sig Dispense Refill   acetaminophen (TYLENOL) 500 MG tablet Take 2 tablets (1,000 mg total) by mouth 2 (two) times daily. 60 tablet 11   atorvastatin (LIPITOR) 20 MG tablet Take 1 tablet (20 mg total) by mouth daily. 90 tablet 3   ferrous sulfate 325 (65 FE) MG tablet TAKE 1 TABLET BY MOUTH 2 TIMES DAILY WITH A MEAL. 180 tablet 0   metoprolol succinate (TOPROL-XL) 50 MG 24 hr tablet Take 1.5 tablets (75 mg total) by mouth daily. Please keep upcoming appt. In April with Dr. Burt Knack in order to receive further refills. Thank You. 135 tablet 0   Multiple Vitamin (MULTIVITAMIN WITH MINERALS) TABS tablet Take 1 tablet by mouth daily. Centrum Silver     nitroGLYCERIN (NITROSTAT) 0.4 MG SL tablet Place 1 tablet (0.4 mg total) under the tongue every 5 (five) minutes as needed for chest pain (maximum of 3 tablets). 25 tablet 4   No current facility-administered medications on file prior to visit.        ROS:  All others reviewed and negative.  Objective        PE:  BP 122/70 (BP Location: Right Arm, Patient Position: Sitting, Cuff Size: Normal)   Pulse 91   Temp 97.8 F (36.6 C) (Oral)   Ht '5\' 4"'$  (1.626 m)   Wt 113 lb (51.3 kg)   SpO2 96%   BMI 19.40 kg/m                 Constitutional: Pt appears in NAD               HENT: Head: NCAT.                Right Ear: External ear normal.                 Left Ear: External ear normal.                Eyes: . Pupils are equal, round, and reactive to light. Conjunctivae and EOM are normal               Nose: without  d/c or deformity                Neck: Neck supple. Gross normal ROM               Cardiovascular: Normal rate and regular rhythm.                 Pulmonary/Chest: Effort normal and breath sounds without rales or wheezing.                Abd:  Soft, NT, ND, + BS, no organomegaly               Neurological: Pt is alert. At baseline orientation, motor grossly intact               Skin: Skin is warm. No rashes, no other new lesions, LE edema - none               Psychiatric: Pt behavior is normal without agitation , mild nervous  Micro: none  Cardiac tracings I have personally interpreted today:  none  Pertinent Radiological findings (summarize): none   Lab Results  Component Value Date   WBC 5.2 08/20/2021   HGB 13.3 08/20/2021   HCT 39.1 08/20/2021   PLT 230 08/20/2021   GLUCOSE 124 (H) 08/20/2021   CHOL 186 04/12/2021   TRIG 145.0 04/12/2021   HDL 75.50 04/12/2021   LDLDIRECT 199.0 09/17/2020   LDLCALC 81 04/12/2021   ALT 14 04/12/2021   AST 19 04/12/2021   NA 136 08/20/2021   K 4.1 08/20/2021   CL 101 08/20/2021   CREATININE 1.00 08/20/2021   BUN 15 08/20/2021   CO2 23 08/20/2021   TSH 1.16 04/12/2021   INR 1.06 01/20/2016   HGBA1C 6.1 04/12/2021   Assessment/Plan:  Toniya B. Didion is a 88 y.o. Black or African American [2] female with  has a past medical history of ANEMIA-IRON DEFICIENCY, Atrial fibrillation ([place]), Blood transfusion, CAD (coronary artery disease), Cataract, COLONIC POLYPS, HX OF (03/09/2007), COPD (03/09/2007), GERD (gastroesophageal reflux disease), GI bleed, echocardiogram, HYPERGLYCEMIA (07/02/2007), Hyperlipidemia, Hypertension (11/12/2006), LEUKOPENIA, MILD (05/30/2008), Mitral valve prolapse, NSTEMI (non-ST elevated myocardial infarction) ([place]) (03/2012), OSTEOARTHRITIS (02/19/2009), OSTEOPOROSIS (11/12/2006), Positive H. pylori test (10/1996), PSVT, Spinal stenosis of lumbar region (07/16/2012), and Transfusion history.  Hyperlipidemia Lab Results  Component Value Date   LDLCALC  81 04/12/2021   Mild uncontrolled, pt to continue current statin lipitor 20 as declines change and will work on lower chol diet   Hyperglycemia Lab Results  Component Value Date   HGBA1C 6.1 04/12/2021   Stable, pt to continue current medical treatment  - diet   Essential hypertension BP Readings from Last 3 Encounters:  08/23/21 (!) 154/96  08/22/21 122/70  08/20/21 131/83   Mild uncontrolled, pt to continue medical treatment metoprolol as declines further change, to work on low salt diet   CKD (chronic kidney disease), stage III Lab Results  Component Value Date   CREATININE 1.00 08/20/2021   Stable overall, cont to avoid nephrotoxins   Anxiety state D/w pt , delcines further tx such as ssri today  Followup: Return if symptoms worsen or fail to improve.  Cathlean Cower, MD 08/25/2021 9:39 AM [facility]

## 2021-08-22 NOTE — Patient Instructions (Addendum)
Please continue all other medications as before, and refills have been done if requested.  Please have the pharmacy call with any other refills you may need.  Please continue your efforts at being more active, low cholesterol diet, and weight control.  You are otherwise up to date with prevention measures today.  Please keep your appointments with your specialists as you may have planned  Please make an Appointment to return in July 21, or sooner if needed

## 2021-08-23 ENCOUNTER — Other Ambulatory Visit: Payer: Self-pay | Admitting: Cardiovascular Disease

## 2021-08-23 ENCOUNTER — Ambulatory Visit (INDEPENDENT_AMBULATORY_CARE_PROVIDER_SITE_OTHER): Payer: Medicare Other | Admitting: Cardiovascular Disease

## 2021-08-23 ENCOUNTER — Encounter: Payer: Self-pay | Admitting: Cardiovascular Disease

## 2021-08-23 VITALS — BP 154/96 | HR 89 | Ht 64.0 in | Wt 112.8 lb

## 2021-08-23 DIAGNOSIS — I10 Essential (primary) hypertension: Secondary | ICD-10-CM

## 2021-08-23 NOTE — Patient Instructions (Signed)
Medication Instructions:  Your physician recommends that you continue on your current medications as directed. Please refer to the Current Medication list given to you today.  *If you need a refill on your cardiac medications before your next appointment, please call your pharmacy*   Lab Work: NONE If you have labs (blood work) drawn today and your tests are completely normal, you will receive your results only by: MyChart Message (if you have MyChart) OR A paper copy in the mail If you have any lab test that is abnormal or we need to change your treatment, we will call you to review the results.   Testing/Procedures: Monitor your blood pressure at home twice weekly (same time of day, same position-feet flat on floor, same arm) and record. Goal should be less than 150/90mmHg. I will call to get these from you to forward to Dr Cooper   Follow-Up: At [facility], you and your health needs are our priority.  As part of our continuing mission to provide you with exceptional heart care, we have created designated Provider Care Teams.  These Care Teams include your primary Cardiologist (physician) and Advanced Practice Providers (APPs -  Physician Assistants and Nurse Practitioners) who all work together to provide you with the care you need, when you need it.  We recommend signing up for the patient portal called "MyChart".  Sign up information is provided on this After Visit Summary.  MyChart is used to connect with patients for Virtual Visits (Telemedicine).  Patients are able to view lab/test results, encounter notes, upcoming appointments, etc.  Non-urgent messages can be sent to your provider as well.   To learn more about what you can do with MyChart, go to [email or web address].    Your next appointment:   6 month(s)  The format for your next appointment:   In Person  Provider:   Michelle Swinyer, NP or Scott Weaver, PA-C     Then, Michael Cooper, MD will plan to see you again  in 1 year(s).    Other Instructions Omron BP kit recommended (not wrist model)  Important Information About Sugar       

## 2021-08-23 NOTE — Progress Notes (Signed)
Cardiology Office Note:    Date:  08/23/2021   ID:  Lindsay Doyle, Lindsay Doyle 1933-05-15, MRN [id number]  PCP:  Biagio Borg, MD   [place] Providers Cardiologist:  Sherren Mocha, MD Electrophysiologist:  Vickie Epley, MD     Referring MD: Biagio Borg, MD   Chief Complaint  Patient presents with   Hypertension    History of Present Illness:    Lindsay Doyle is a 88 y.o. female with a hx of  paroxysmal atrial fibrillation and heart palpitations.  The patient had atrial fibrillation in 2014 associated with elevated troponin.  Cardiac catheterization demonstrated mild nonobstructive coronary disease.  The patient has had significant GI bleeding secondary to AVMs and she has declined further anticoagulant drugs.  She was recently sent to the emergency department because of severe hypertension.  She was at an ENT office for removal of cerumen impaction and apparently this has been painful for her in the past and caused a lot of stress.  the patient went to the emergency room and had very high blood pressure there as well.  They had trouble with IV access and she was given an antihypertensive intramuscularly with quick resolution of her hypertension.  She was seen by Dr. Jenny Reichmann yesterday and her blood pressure was in the normal range.  She has not been checking her blood pressure at home.  The patient feels well and denies any symptoms of chest pain, chest pressure, headache, lightheadedness, or heart palpitations.  Past Medical History:  Diagnosis Date   ANEMIA-IRON DEFICIENCY    presumed GI bleed 08/2012- anticoag stopped   Atrial fibrillation ([place])    a. Apixaban started 03/2012 - stopped after admx for GI bleed 6/14   Blood transfusion    a. 05/2010: 3 transfusions for anemia/GIB.   CAD (coronary artery disease)    a. LHC 1/14:  mLAD serial 30 and 60%, oRCA 30%, EF 60%, Afib,    Cataract    COLONIC POLYPS, HX OF 03/09/2007   COPD 03/09/2007   ? pt is unsure   GERD  (gastroesophageal reflux disease)    GI bleed    thought to be due to AVMs   Hx of echocardiogram    a. echo 2/11:  EF 55-60%, mild AI, mild RAE, mild to mod TR;   b.  a. Echo 2/14:  Mild focal basal and mild LVH, EF 60-65%, mild AI, mild RAE, trivia effusion   HYPERGLYCEMIA 07/02/2007   Hyperlipidemia    Hypertension 11/12/2006   LEUKOPENIA, MILD 05/30/2008   Mitral valve prolapse    Remotely - last echo 2011 did not show this   NSTEMI (non-ST elevated myocardial infarction) ([place]) 03/2012   a. Type II in setting of AF with RVR 03/2012   OSTEOARTHRITIS 02/19/2009   Spinal   OSTEOPOROSIS 11/12/2006   Positive H. pylori test 10/1996   PSVT    a. Remotely, in setting of anemia.   Spinal stenosis of lumbar region 07/16/2012   Transfusion history    7/15     Past Surgical History:  Procedure Laterality Date   ABDOMINAL HYSTERECTOMY     CARDIAC CATHETERIZATION  1992   S/P in 1992 at [facility] in [place]. This was negative for any coronary artery disease   carotid duplex  08/29/2003   CATARACT EXTRACTION     ceasarean     ENTEROSCOPY N/A 10/10/2013   Procedure: ENTEROSCOPY;  Surgeon: Inda Castle, MD;  Location:  [facility] ENDOSCOPY;  Service: Endoscopy;  Laterality: N/A;   ENTEROSCOPY N/A 12/28/2014   Procedure: ENTEROSCOPY;  Surgeon: Inda Castle, MD;  Location: [facility] ENDOSCOPY;  Service: Endoscopy;  Laterality: N/A;   ENTEROSCOPY N/A 01/20/2016   Procedure: ENTEROSCOPY;  Surgeon: Manus Gunning, MD;  Location: [facility] ENDOSCOPY;  Service: Gastroenterology;  Laterality: N/A;   HOT HEMOSTASIS N/A 12/28/2014   Procedure: HOT HEMOSTASIS (ARGON PLASMA COAGULATION/BICAP);  Surgeon: Inda Castle, MD;  Location: [facility] ENDOSCOPY;  Service: Endoscopy;  Laterality: N/A;   LEFT HEART CATHETERIZATION WITH CORONARY ANGIOGRAM N/A 04/23/2012   Procedure: LEFT HEART CATHETERIZATION WITH CORONARY ANGIOGRAM;  Surgeon: Larey Dresser, MD;  Location: [facility] CATH LAB;  Service: Cardiovascular;   Laterality: N/A;   TONSILLECTOMY     TUBAL LIGATION      Current Medications: Current Meds  Medication Sig   acetaminophen (TYLENOL) 500 MG tablet Take 2 tablets (1,000 mg total) by mouth 2 (two) times daily.   atorvastatin (LIPITOR) 20 MG tablet Take 1 tablet (20 mg total) by mouth daily.   ferrous sulfate 325 (65 FE) MG tablet TAKE 1 TABLET BY MOUTH 2 TIMES DAILY WITH A MEAL.   metoprolol succinate (TOPROL-XL) 50 MG 24 hr tablet Take 1.5 tablets (75 mg total) by mouth daily. Please keep upcoming appt. In April with Dr. Burt Knack in order to receive further refills. Thank You.   metoprolol tartrate (LOPRESSOR) 25 MG tablet TAKE 1 TABLET (25 MG TOTAL) BY MOUTH DAILY AS NEEDED (PALPITATIONS).   Multiple Vitamin (MULTIVITAMIN WITH MINERALS) TABS tablet Take 1 tablet by mouth daily. Centrum Silver   nitroGLYCERIN (NITROSTAT) 0.4 MG SL tablet Place 1 tablet (0.4 mg total) under the tongue every 5 (five) minutes as needed for chest pain (maximum of 3 tablets).     Allergies:   Doxycycline, Biaxin [clarithromycin], and Metronidazole   Social History   Socioeconomic History   Marital status: Married    Spouse name: Not on file   Number of children: 1   Years of education: Not on file   Highest education level: Not on file  Occupational History   Occupation: retired    Comment: retired  Tobacco Use   Smoking status: Never   Smokeless tobacco: Never  Scientific laboratory technician Use: Never used  Substance and Sexual Activity   Alcohol use: No   Drug use: No   Sexual activity: Never  Other Topics Concern   Not on file  Social History Narrative   Not on file   Social Determinants of Health   Financial Resource Strain: Low Risk    Difficulty of Paying Living Expenses: Not hard at all  Food Insecurity: No Food Insecurity   Worried About Charity fundraiser in the Last Year: Never true   [place] in the Last Year: Never true  Transportation Needs: No Transportation Needs   Lack of  Transportation (Medical): No   Lack of Transportation (Non-Medical): No  Physical Activity: Sufficiently Active   Days of Exercise per Week: 6 days   Minutes of Exercise per Session: 60 min  Stress: No Stress Concern Present   Feeling of Stress : Not at all  Social Connections: Socially Integrated   Frequency of Communication with Friends and Family: More than three times a week   Frequency of Social Gatherings with Friends and Family: Three times a week   Attends Religious Services: More than 4 times per year   Active Member of Clubs or Organizations:  Yes   Attends Archivist Meetings: More than 4 times per year   Marital Status: Married     Family History: The patient's family history includes Colon cancer in her brother; Prostate cancer in her brother; Sarcoidosis in her sister and another family member; Stomach cancer in her mother. There is no history of CAD.  ROS:   Please see the history of present illness.    All other systems reviewed and are negative.  EKGs/Labs/Other Studies Reviewed:     Recent Labs: 04/12/2021: ALT 14; TSH 1.16 08/20/2021: BUN 15; Creatinine, Ser 1.00; Hemoglobin 13.3; Platelets 230; Potassium 4.1; Sodium 136  Recent Lipid Panel    Component Value Date/Time   CHOL 186 04/12/2021 1155   TRIG 145.0 04/12/2021 1155   HDL 75.50 04/12/2021 1155   CHOLHDL 2 04/12/2021 1155   VLDL 29.0 04/12/2021 1155   LDLCALC 81 04/12/2021 1155   LDLDIRECT 199.0 09/17/2020 1511     Risk Assessment/Calculations:           Physical Exam:    VS:  BP (!) 154/96   Pulse 89   Ht _0  (1.626 m)   Wt 112 lb 12.8 oz (51.2 kg)   SpO2 99%   BMI 19.36 kg/m     Wt Readings from Last 3 Encounters:  08/23/21 112 lb 12.8 oz (51.2 kg)  08/22/21 113 lb (51.3 kg)  07/15/21 112 lb 6.4 oz (51 kg)     GEN:  Well nourished, well developed in no acute distress HEENT: Normal NECK: No JVD; No carotid bruits LYMPHATICS: No lymphadenopathy CARDIAC: RRR, no  murmurs, rubs, gallops RESPIRATORY:  Clear to auscultation without rales, wheezing or rhonchi  ABDOMEN: Soft, non-tender, non-distended MUSCULOSKELETAL:  No edema; No deformity  SKIN: Warm and dry NEUROLOGIC:  Alert and oriented x 3 PSYCHIATRIC:  Normal affect   ASSESSMENT:    1. Essential hypertension    PLAN:    In order of problems listed above:  The patient had a recent episode of severe hypertension associated with the stress of medical appointment.  She had a lot of pain the last time she underwent cerumen extraction and was very anxious about this.  Blood pressure has improved and on my recheck today her blood pressure is 128/92.  We need to get a better sense of how her blood pressure is running at home.  She was given instructions on obtaining an Omron blood pressure cuff and checking her blood pressure at least twice weekly.  We will follow-up with her by phone in a few weeks and see how her blood pressure is doing.  I would aim to keep her blood pressure consistently less than 150/90 considering her advanced age.  If blood pressure is above goal, I would favor starting amlodipine 5 mg daily in addition to her current medicines.  Please see my recent office visit note summarizing her other cardiac issues.  I reviewed notes, lab data, and her EKG from the emergency room as part of her evaluation today.      Medication Adjustments/Labs and Tests Ordered: Current medicines are reviewed at length with the patient today.  Concerns regarding medicines are outlined above.  No orders of the defined types were placed in this encounter.  No orders of the defined types were placed in this encounter.   Patient Instructions  Medication Instructions:  Your physician recommends that you continue on your current medications as directed. Please refer to the Current Medication list given to you  today.  *If you need a refill on your cardiac medications before your next appointment, please call  your pharmacy*   Lab Work: NONE If you have labs (blood work) drawn today and your tests are completely normal, you will receive your results only by: [place] (if you have MyChart) OR A paper copy in the mail If you have any lab test that is abnormal or we need to change your treatment, we will call you to review the results.   Testing/Procedures: Monitor your blood pressure at home twice weekly (same time of day, same position-feet flat on floor, same arm) and record. Goal should be less than 150/35mHg. I will call to get these from you to forward to Dr CBurt Knack  Follow-Up: [facility] and your [facility] our [facility].  As part of our continuing mission to provide you with exceptional heart care, we have created designated Provider Care Teams.  These Care Teams include your primary Cardiologist (physician) and Advanced Practice Providers (APPs -  Physician Assistants and Nurse Practitioners) who all work together to provide you with the care you need, when you need it.  We recommend signing up for the patient portal called "MyChart".  Sign up information is provided on this After Visit Summary.  MyChart is used to connect with patients for Virtual Visits (Telemedicine).  Patients are able to view lab/test results, encounter notes, upcoming appointments, etc.  Non-urgent messages can be sent to your provider as well.   To learn more about what you can do with MyChart, go to [email or web address]    Your next appointment:   6 month(s)  The format for your next appointment:   In Person  Provider:   MChristen Bame NP or SRichardson Dopp PA-C     Then, MSherren Mocha MD will plan to see you again in 1 year(s).    Other Instructions Omron BP kit recommended (not wrist model)  Important Information About Sugar         Signed, MSherren Mocha MD  08/23/2021 1:53 PM    [facility]

## 2021-08-25 ENCOUNTER — Encounter: Payer: Self-pay | Admitting: Internal Medicine

## 2021-08-25 NOTE — Assessment & Plan Note (Signed)
Lab Results  Component Value Date   HGBA1C 6.1 04/12/2021   Stable, pt to continue current medical treatment  - diet

## 2021-08-25 NOTE — Assessment & Plan Note (Signed)
Lab Results  Component Value Date   LDLCALC 81 04/12/2021   Mild uncontrolled, pt to continue current statin lipitor 20 as declines change and will work on lower chol diet

## 2021-08-25 NOTE — Assessment & Plan Note (Signed)
Lab Results  Component Value Date   CREATININE 1.00 08/20/2021   Stable overall, cont to avoid nephrotoxins  

## 2021-08-25 NOTE — Assessment & Plan Note (Signed)
D/w pt , delcines further tx such as ssri today

## 2021-08-25 NOTE — Assessment & Plan Note (Signed)
BP Readings from Last 3 Encounters:  08/23/21 (!) 154/96  08/22/21 122/70  08/20/21 131/83   Mild uncontrolled, pt to continue medical treatment metoprolol as declines further change, to work on low salt diet

## 2021-08-27 ENCOUNTER — Other Ambulatory Visit: Payer: Self-pay | Admitting: Cardiovascular Disease

## 2021-08-29 ENCOUNTER — Telehealth: Payer: Self-pay | Admitting: Internal Medicine

## 2021-08-29 NOTE — Telephone Encounter (Signed)
N/A unable to leave a message for patient to call back to schedule Medicare Annual Wellness Visit   Last AWV  08/31/21  Please schedule at anytime with [facility] if patient calls the office back.      Any questions, please call me at [phone]

## 2021-08-30 ENCOUNTER — Other Ambulatory Visit: Payer: Self-pay | Admitting: Internal Medicine

## 2021-09-23 ENCOUNTER — Telehealth: Payer: Self-pay

## 2021-09-25 ENCOUNTER — Telehealth: Payer: Self-pay | Admitting: Cardiovascular Disease

## 2021-09-25 NOTE — Telephone Encounter (Signed)
Follow Up:    Patient is returning Sarah's call from yesterday. She is calling to give her blood pressure readings.   09-03-21- 116/68 and pulse 78 09-06-21-   116/66 and pulse 78 09-10-21-  146/80 and pulse 78 09-13-21-   136/82 and pulse 85  09-18-21-   106/68 and pulse 94  09-20-21-    104/66 forgot to take pulse rate  09-24-21-     128/76 and pulse 78

## 2021-09-25 NOTE — Telephone Encounter (Signed)
Pt is calling back to report to Sarah and Dr. Burt Knack her recorded BP/HR readings.  Dr. Burt Knack advised for the pt to send these from 5/26 OV.  He asked the pt to take her BP twice weekly and record and send these.  Her BP goal is <150/90.   Pts BP/HR readings are mentioned in this message.  Informed the pt that Dr. Burt Knack and RN are out of the office today, but I will route her recordings to them to both review and advise on, upon return to the office.  Did inform the pt that her readings look good, and her numbers meet criteria of BP goal <150/90.  Advised the pt to continue her current regimen as is, and she will hear from Dr. Antionette Char RN if any further changes need to be made.  Pt verbalized understanding and agrees with this plan

## 2021-09-26 NOTE — Telephone Encounter (Signed)
Returned call to patient. Per OV note on 5/26: Blood pressure has improved and on my recheck today her blood pressure is 128/92.  We need to get a better sense of how her blood pressure is running at home.  She was given instructions on obtaining an [employer] blood pressure cuff and checking her blood pressure at least twice weekly.  We will follow-up with her by phone in a few weeks and see how her blood pressure is doing.  I would aim to keep her blood pressure consistently less than 150/90 considering her advanced age.  If blood pressure is above goal, I would favor starting amlodipine 5 mg daily in addition to her current medicines.  Advised her to keep a check on her BP and let us know if she starts trending up and is consistently reaching 150/90 or higher. Pt understands.

## 2021-10-01 NOTE — Telephone Encounter (Signed)
BP readings are excellent. Continue same Rx.

## 2021-10-18 ENCOUNTER — Ambulatory Visit: Payer: Medicare Other | Admitting: Internal Medicine

## 2021-11-01 ENCOUNTER — Other Ambulatory Visit: Payer: Self-pay | Admitting: Internal Medicine

## 2021-11-06 ENCOUNTER — Telehealth: Payer: Self-pay | Admitting: Cardiovascular Disease

## 2021-11-06 ENCOUNTER — Telehealth: Payer: Self-pay | Admitting: Internal Medicine

## 2021-11-06 MED ORDER — CLONAZEPAM 0.5 MG PO TABS: 0.5000 mg | ORAL_TABLET | Freq: Two times a day (BID) | ORAL | 1 refills | Status: DC | PRN

## 2021-11-06 NOTE — Telephone Encounter (Signed)
Patient called and asked that Dr. Jenny Reichmann call in something for anxiety.  Her husband passed away on the 11-09-21.  Remer Macho is not until the 19th.  Please advise.

## 2021-11-06 NOTE — Telephone Encounter (Signed)
Ok for low dose klonopin bid prn - done erx

## 2021-11-06 NOTE — Telephone Encounter (Signed)
Patient notified

## 2021-11-06 NOTE — Telephone Encounter (Signed)
  Pt called c/o anxiety due to losing her husband recently per note.  Pt wanted to know if she should increase her Metoprolol?    Pt called back and son / Pt on speaker phone. Pt seemed mildly anxious on the telephone.  I asked Pt to calmly sit a few minutes, and have son use at home BP cuff to assess Pt's blood pressure and HR.  Pt did NOT sit long enough to completely relax as advised, heard Pt talking / moving through home to get BP cuff.   Son placed BP cuff on upper arm, and BP was 160 / 94, HR 83.  Checked Pt chart, pt runs between 824 systolic to upper 235'T, with diastolic between 61'W to upper 90's.    Pt advised to continue to monitor her BP and HR daily.  Pt also advised to contact her PCP today to address her new feelings of being upset / anxiety that is ongoing due to the loss of her husband.    Pt advised to reach back out to [facility] if her BP continues to be elevated, with consistent readings, told to check the BP daily, and also told to contact us with any new symptoms; Chest pain, SOB, DOE, Dizziness, etc.... ( Pt and son educated. ) Pt told we can schedule a Cardiology appointment if BP continues to be an issue or new symptoms occur;  Pt advised to contact her PCP this afternoon to schedule a visit to address and assess her new anxiety.   Pt understood instructions and will f/u with her PCP.

## 2021-11-06 NOTE — Telephone Encounter (Signed)
Does she require a visit first? Please advise.

## 2021-11-06 NOTE — Telephone Encounter (Signed)
New Message:   Patient said her husband died on last Thursday(11-12-21). She feels a little anxious,, wonder if she needs to increase her Metoprolol? Also should she take more on the day of the funeral, which is 11-16-21?[name]

## 2021-12-26 DIAGNOSIS — Z23 Encounter for immunization: Secondary | ICD-10-CM | POA: Diagnosis not present

## 2022-01-06 DIAGNOSIS — Z23 Encounter for immunization: Secondary | ICD-10-CM | POA: Diagnosis not present

## 2022-01-23 NOTE — Patient Instructions (Signed)

## 2022-01-23 NOTE — Progress Notes (Signed)
Subjective:   Lindsay Doyle is a 88 y.o. female who presents for Medicare Annual (Subsequent) preventive examination. I connected with  Cheyann B. Romain on 01/27/22 by a audio enabled telemedicine application and verified that I am speaking with the correct person using two identifiers.  Patient Location: Home  Provider Location: Home Office  I discussed the limitations of evaluation and management by telemedicine. The patient expressed understanding and agreed to proceed.  Review of Systems    Deferred to PCP Cardiac Risk Factors include: advanced age (>62mn, >>82women);dyslipidemia;hypertension     Objective:    Today's Vitals   01/27/22 1234  PainSc: 3    There is no height or weight on file to calculate BMI.     08/31/2020   12:56 PM 07/11/2019   10:40 AM 06/01/2019   11:58 AM 05/31/2018   11:12 AM 05/26/2017   11:53 AM 05/20/2016   11:30 AM 01/19/2016    7:00 PM  Advanced Directives  Does Patient Have a Medical Advance Directive? Yes Yes No Yes Yes Yes No  Type of AParamedicof APhiloLiving will Healthcare Power of ASuttonLiving will HMcLendon-ChisholmLiving will HDrewLiving will   Does patient want to make changes to medical advance directive? No - Patient declined No - Patient declined       Copy of [place] Chart? No - copy requested No - copy requested  No - copy requested No - copy requested No - copy requested   Would patient like information on creating a medical advance directive?   No - Patient declined    No - patient declined information    Current Medications (verified) Outpatient Encounter Medications as of 01/27/2022  Medication Sig   acetaminophen (TYLENOL) 500 MG tablet Take 2 tablets (1,000 mg total) by mouth 2 (two) times daily.   atorvastatin (LIPITOR) 20 MG tablet TAKE 1 TABLET BY MOUTH EVERY DAY   ferrous sulfate 325 (65 FE) MG  tablet TAKE 1 TABLET BY MOUTH TWICE A DAY WITH MEALS   metoprolol succinate (TOPROL-XL) 50 MG 24 hr tablet : TAKE 1.5 TABLETS BY MOUTH DAILY.   metoprolol tartrate (LOPRESSOR) 25 MG tablet TAKE 1 TABLET (25 MG TOTAL) BY MOUTH DAILY AS NEEDED (PALPITATIONS).   Multiple Vitamin (MULTIVITAMIN WITH MINERALS) TABS tablet Take 1 tablet by mouth daily. Centrum Silver   nitroGLYCERIN (NITROSTAT) 0.4 MG SL tablet Place 1 tablet (0.4 mg total) under the tongue every 5 (five) minutes as needed for chest pain (maximum of 3 tablets).   clonazePAM (KLONOPIN) 0.5 MG tablet Take 1 tablet (0.5 mg total) by mouth 2 (two) times daily as needed for anxiety. (Patient not taking: Reported on 01/27/2022)   No facility-administered encounter medications on file as of 01/27/2022.    Allergies (verified) Doxycycline, Biaxin [clarithromycin], and Metronidazole   History: Past Medical History:  Diagnosis Date   ANEMIA-IRON DEFICIENCY    presumed GI bleed 08/2012- anticoag stopped   Atrial fibrillation ([place]    a. Apixaban started 03/2012 - stopped after admx for GI bleed 6/14   Blood transfusion    a. 05/2010: 3 transfusions for anemia/GIB.   CAD (coronary artery disease)    a. LHC 1/14:  mLAD serial 30 and 60%, oRCA 30%, EF 60%, Afib,    Cataract    COLONIC POLYPS, HX OF 03/09/2007   COPD 03/09/2007   ? pt is unsure   GERD (  gastroesophageal reflux disease)    GI bleed    thought to be due to AVMs   Hx of echocardiogram    a. echo 2/11:  EF 55-60%, mild AI, mild RAE, mild to mod TR;   b.  a. Echo 2/14:  Mild focal basal and mild LVH, EF 60-65%, mild AI, mild RAE, trivia effusion   HYPERGLYCEMIA 07/02/2007   Hyperlipidemia    Hypertension 11/12/2006   LEUKOPENIA, MILD 05/30/2008   Mitral valve prolapse    Remotely - last echo 2011 did not show this   NSTEMI (non-ST elevated myocardial infarction) ([place]) 03/2012   a. Type II in setting of AF with RVR 03/2012   OSTEOARTHRITIS 02/19/2009   Spinal   OSTEOPOROSIS  11/12/2006   Positive H. pylori test 10/1996   PSVT    a. Remotely, in setting of anemia.   Spinal stenosis of lumbar region 07/16/2012   Transfusion history    7/15    Past Surgical History:  Procedure Laterality Date   ABDOMINAL HYSTERECTOMY     CARDIAC CATHETERIZATION  1992   S/P in [facility] in [place]. This was negative for any coronary artery disease   carotid duplex  08/29/2003   CATARACT EXTRACTION     ceasarean     ENTEROSCOPY N/A 10/10/2013   Procedure: ENTEROSCOPY;  Surgeon: Inda Castle, MD;  Location: [facility] ENDOSCOPY;  Service: Endoscopy;  Laterality: N/A;   ENTEROSCOPY N/A 12/28/2014   Procedure: ENTEROSCOPY;  Surgeon: Inda Castle, MD;  Location: [facility] ENDOSCOPY;  Service: Endoscopy;  Laterality: N/A;   ENTEROSCOPY N/A 01/20/2016   Procedure: ENTEROSCOPY;  Surgeon: Manus Gunning, MD;  Location: [facility] ENDOSCOPY;  Service: Gastroenterology;  Laterality: N/A;   HOT HEMOSTASIS N/A 12/28/2014   Procedure: HOT HEMOSTASIS (ARGON PLASMA COAGULATION/BICAP);  Surgeon: Inda Castle, MD;  Location: [facility] ENDOS[facility];  Service: Endoscopy;  Laterality: N/A;   LEFT HEART CATHETERIZATION WITH CORONARY ANGIOGRAM N/A 04/23/2012   Procedure: LEFT HEART CATHETERIZATION WITH CORONARY ANGIOGRAM;  Surgeon: Larey Dresser, MD;  Location: [facility] CATH LAB;  Service: Cardiovascular;  Laterality: N/A;   TONSILLECTOMY     TUBAL LIGATION     Family History  Problem Relation Age of Onset   Prostate cancer Brother    Stomach cancer Mother    Sarcoidosis Sister    Colon cancer Brother    Sarcoidosis Other    CAD Neg Hx    Social History   Socioeconomic History   Marital status: Widowed    Spouse name: Not on file   Number of children: 1   Years of education: Not on file   Highest education level: Not on file  Occupational History   Occupation: retired    Comment: retired  Tobacco Use   Smoking status: Never   Smokeless tobacco: Never  Vaping Use   Vaping  Use: Never used  Substance and Sexual Activity   Alcohol use: No   Drug use: No   Sexual activity: Never  Other Topics Concern   Not on file  Social History Narrative   Not on file   Social Determinants of Health   Financial Resource Strain: Low Risk  (01/27/2022)   Overall Financial Resource Strain (CARDIA)    Difficulty of Paying Living Expenses: Not very hard  Food Insecurity: No Food Insecurity (01/27/2022)   Hunger Vital Sign    Worried About Running Out of Food in the Last Year: Never true    Ran  Out of Food in the Last Year: Never true  Transportation Needs: No Transportation Needs (01/27/2022)   PRAPARE - Hydrologist (Medical): No    Lack of Transportation (Non-Medical): No  Physical Activity: Sufficiently Active (01/27/2022)   Exercise Vital Sign    Days of Exercise per Week: 5 days    Minutes of Exercise per Session: 50 min  Stress: Stress Concern Present (01/27/2022)   [place]    Feeling of Stress : Rather much  Social Connections: Moderately Integrated (01/27/2022)   Social Connection and Isolation Panel [NHANES]    Frequency of Communication with Friends and Family: More than three times a week    Frequency of Social Gatherings with Friends and Family: More than three times a week    Attends Religious Services: More than 4 times per year    Active Member of [employer] or Organizations: Yes    Attends Archivist Meetings: More than 4 times per year    Marital Status: Widowed    Tobacco Counseling Counseling given: Not Answered   Clinical Intake:  Pre-visit preparation completed: Yes  Pain : 0-10 Pain Score: 3  Pain Type: Chronic pain Pain Location: Hand (arthritis hands) Pain Orientation: Right, Left Pain Descriptors / Indicators: Aching, Discomfort, Nagging Pain Relieving Factors: tylenol  Pain Relieving Factors: tylenol  Nutritional Status: BMI  <19  Underweight Nutritional Risks: None Diabetes: No  How often do you need to have someone help you when you read instructions, pamphlets, or other written materials from your doctor or pharmacy?: 1 - Never What is the last grade level you completed in school?: 12th  Diabetic?No  Interpreter Needed?: No  Information entered by :: Emelia Loron RN   Activities of Daily Living    01/27/2022   12:57 PM  In your present state of health, do you have any difficulty performing the following activities:  Hearing? 0  Vision? 0  Comment requests referral for an eye exam  Difficulty concentrating or making decisions? 0  Walking or climbing stairs? 0  Dressing or bathing? 0  Doing errands, shopping? 0  Preparing Food and eating ? N  Using the Toilet? N  In the past six months, have you accidently leaked urine? N  Do you have problems with loss of bowel control? N  Managing your Medications? N  Managing your Finances? N  Housekeeping or managing your Housekeeping? N    Patient Care Team: Biagio Borg, MD as PCP - General (Internal Medicine) Sherren Mocha, MD as PCP - Cardiology (Cardiology) Vickie Epley, MD as PCP - Electrophysiology (Cardiology) Armbruster, Carlota Raspberry, MD as Consulting Physician (Gastroenterology)  Indicate any recent Medical Services you may have received from other than [facility] providers in the past year (date may be approximate).     Assessment:   This is a routine wellness examination for Dayanna.  Hearing/Vision screen No results found.  Dietary issues and exercise activities discussed: Current Exercise Habits: Home exercise routine, Type of exercise: calisthenics;walking, Time (Minutes): 50, Frequency (Times/Week): 5, Weekly Exercise (Minutes/Week): 250, Intensity: Mild, Exercise limited by: None identified   Goals Addressed             This Visit's Progress    Patient Stated       Continue to do  my GTN exercise program to stay active.       Depression Screen    01/27/2022   12:54 PM 04/12/2021  11:33 AM 09/17/2020    2:58 PM 08/31/2020   12:50 PM 09/16/2019    2:25 PM 07/11/2019   10:40 AM 09/16/2018    9:54 AM  PHQ 2/9 Scores  PHQ - 2 Score 1 0 1 0 1 0 1    Fall Risk    01/27/2022   12:42 PM 04/12/2021   11:33 AM 09/17/2020    2:57 PM 08/31/2020   12:56 PM 09/16/2019    2:25 PM  [place] in the past year? 0 0 0 0 0  Number falls in past yr: 0 0 0 0   Injury with Fall? 0 0 0 0   Risk for fall due to : No Fall Risks   No Fall Risks   Follow up Falls evaluation completed   Falls evaluation completed     FALL RISK PREVENTION PERTAINING TO THE HOME:  Any stairs in or around the home? Yes  If so, are there any without handrails? Yes  Home free of loose throw rugs in walkways, pet beds, electrical cords, etc? Yes  Adequate lighting in your home to reduce risk of falls? Yes   ASSISTIVE DEVICES UTILIZED TO PREVENT FALLS:  Life alert? No  Use of a cane, walker or w/c? No  Grab bars in the bathroom? Yes  Shower chair or bench in shower? No  Elevated toilet seat or a handicapped toilet? No   Cognitive Function:    05/31/2018   11:25 AM 05/26/2017   12:01 PM 05/20/2016   11:35 AM  MMSE - Mini Mental State Exam  Orientation to time '5 5 5  '$ Orientation to Place '5 5 5  '$ Registration '3 3 3  '$ Attention/ Calculation '5 5 5  '$ Recall '3 1 1  '$ Language- name 2 objects '2 2 2  '$ Language- repeat '1 1 1  '$ Language- follow 3 step command '3 3 3  '$ Language- read & follow direction '1 1 1  '$ Write a sentence '1 1 1  '$ Copy design '1 1 1  '$ Total score '30 28 28        '$ 01/27/2022   12:42 PM 07/11/2019   10:42 AM  6CIT Screen  What Year? 0 points 0 points  What month? 0 points 0 points  What time? 0 points 0 points  Count back from 20 0 points 0 points  Months in reverse 0 points 0 points  Repeat phrase 0 points 2 points  Total Score 0 points 2 points    Immunizations Immunization History  Administered Date(s) Administered    Fluad Quad(high Dose 65+) 12/23/2018, 01/25/2020, 01/19/2021   Influenza Split 03/10/2011, 01/08/2012   Influenza Whole 02/07/2008, 02/05/2009, 02/18/2010   Influenza, High Dose Seasonal PF 01/25/2013, 03/06/2015, 01/16/2017, 01/08/2018   Influenza,inj,Quad PF,6+ Mos 01/25/2014, 01/03/2016   PFIZER Comirnaty(Gray Top)Covid-19 Tri-Sucrose Vaccine 07/30/2020   PFIZER(Purple Top)SARS-COV-2 Vaccination 05/14/2019, 06/06/2019, 02/11/2020   Pneumococcal Conjugate-13 02/08/2013   Pneumococcal Polysaccharide-23 12/29/1997, 01/08/2012   Td 07/29/1996, 07/17/2008    TDAP status: Due, Education has been provided regarding the importance of this vaccine. Advised may receive this vaccine at local pharmacy or Health Dept. Aware to provide a copy of the vaccination record if obtained from local pharmacy or Health Dept. Verbalized acceptance and understanding.  Flu Vaccine status: Up to date  Pneumococcal vaccine status: Up to date  Covid-19 vaccine status: Information provided on how to obtain vaccines.   Qualifies for Shingles Vaccine? Yes   Zostavax completed No   Shingrix Completed?: No.  Education has been provided regarding the importance of this vaccine. Patient has been advised to call insurance company to determine out of pocket expense if they have not yet received this vaccine. Advised may also receive vaccine at local pharmacy or Health Dept. Verbalized acceptance and understanding.  Screening Tests Health Maintenance  Topic Date Due   Zoster Vaccines- Shingrix (1 of 2) Never done   TETANUS/TDAP  07/18/2018   COVID-19 Vaccine (5 - Pfizer series) 09/24/2020   Medicare Annual Wellness (AWV)  01/28/2023   Pneumonia Vaccine 70+ Years old  Completed   INFLUENZA VACCINE  Completed   DEXA SCAN  Completed   HPV VACCINES  Aged Out    Health Maintenance  Health Maintenance Due  Topic Date Due   Zoster Vaccines- Shingrix (1 of 2) Never done   TETANUS/TDAP  07/18/2018   COVID-19 Vaccine  (5 - Pfizer series) 09/24/2020    Colorectal cancer screening: No longer required.   Mammogram status: No longer required due to age.  Bone Density status: Completed 05/20/16. Results reflect: Bone density results: OSTEOPENIA. Repeat every 2 years.  Lung Cancer Screening: (Low Dose CT Chest recommended if Age 72-80 years, 30 pack-year currently smoking OR have quit w/in 15years.) does not qualify.   Additional Screening:  Hepatitis C Screening: does qualify; Completed education provided  Vision Screening: Recommended annual ophthalmology exams for early detection of glaucoma and other disorders of the eye. Is the patient up to date with their annual eye exam?  No  Who is the provider or what is the name of the office in which the patient attends annual eye exams? none If pt is not established with a provider, would they like to be referred to a provider to establish care? Yes .   Dental Screening: Recommended annual dental exams for proper oral hygiene  Community Resource Referral / Chronic Care Management: CRR required this visit?  No   CCM required this visit?  No      Plan:     I have personally reviewed and noted the following in the patient's chart:   Medical and social history Use of alcohol, tobacco or illicit drugs  Current medications and supplements including opioid prescriptions. Patient is not currently taking opioid prescriptions. Functional ability and status Nutritional status Physical activity Advanced directives List of other physicians Hospitalizations, surgeries, and ER visits in previous 12 months Vitals Screenings to include cognitive, depression, and falls Referrals and appointments  In addition, I have reviewed and discussed with patient certain preventive protocols, quality metrics, and best practice recommendations. A written personalized care plan for preventive services as well as general preventive health recommendations were provided to  patient.     Michiel Cowboy, RN   01/27/2022   Nurse Notes:  Ms. Grillot , Thank you for taking time to come for your Medicare Wellness Visit. I appreciate your ongoing commitment to your health goals. Please review the following plan we discussed and let me know if I can assist you in the future.   These are the goals we discussed:  Goals      Patient Stated     Maintain current healthy status, stay as active and as independent as possible     Patient Stated     Maintain my weight by increasing my lunch. Continue to exercise.      Patient Stated     To stay safe and survive this pandemic.     Patient Stated     Continue  to do  my GTN exercise program to stay active.     wants to improve memory     Continue to work puzzles. I will try to do a puzzle per day.        This is a list of the screening recommended for you and due dates:  Health Maintenance  Topic Date Due   Zoster (Shingles) Vaccine (1 of 2) Never done   Tetanus Vaccine  07/18/2018   COVID-19 Vaccine (5 - Pfizer series) 09/24/2020   Medicare Annual Wellness Visit  01/28/2023   Pneumonia Vaccine  Completed   Flu Shot  Completed   DEXA scan (bone density measurement)  Completed   HPV Vaccine  Aged Out

## 2022-01-27 ENCOUNTER — Ambulatory Visit (INDEPENDENT_AMBULATORY_CARE_PROVIDER_SITE_OTHER): Payer: Medicare Other | Admitting: *Deleted

## 2022-01-27 DIAGNOSIS — Z0100 Encounter for examination of eyes and vision without abnormal findings: Secondary | ICD-10-CM

## 2022-01-27 DIAGNOSIS — Z0000 Encounter for general adult medical examination without abnormal findings: Secondary | ICD-10-CM

## 2022-02-25 ENCOUNTER — Telehealth: Payer: Self-pay | Admitting: Cardiovascular Disease

## 2022-02-25 ENCOUNTER — Encounter: Payer: Self-pay | Admitting: Cardiology

## 2022-02-25 ENCOUNTER — Ambulatory Visit: Payer: Medicare Other | Attending: Physician Assistant | Admitting: Cardiology

## 2022-02-25 VITALS — BP 128/76 | HR 78 | Ht 64.0 in | Wt 105.8 lb

## 2022-02-25 DIAGNOSIS — I10 Essential (primary) hypertension: Secondary | ICD-10-CM

## 2022-02-25 MED ORDER — METOPROLOL SUCCINATE ER 50 MG PO TB24: 50.0000 mg | ORAL_TABLET | Freq: Every day | ORAL | 3 refills | Status: DC

## 2022-02-25 NOTE — Telephone Encounter (Signed)
Pt c/o medication issue:  1. Name of Medication: metoprolol succinate (TOPROL-XL) 50 MG 24 hr tablet   2. How are you currently taking this medication (dosage and times per day)? As written  3. Are you having a reaction (difficulty breathing--STAT)? No   4. What is your medication issue? Patient is wondering why was her medication metoprolol succinate (TOPROL-XL) 50 MG 24 hr tablet was reduced to Take 1 tablet (50 mg total) by mouth daily instead of taking 1 1/2 tablets (75 mg total) by mouth daily

## 2022-02-25 NOTE — Progress Notes (Signed)
Cardiology Office Note:    Date:  02/25/2022   ID:  Lindsay Doyle, Lindsay Doyle 03/19/1934, MRN [id number]  PCP:  Biagio Borg, MD   [facility] Cardiologist:  Sherren Mocha, MD Electrophysiologist:  Vickie Epley, MD     Referring MD: Biagio Borg, MD   Chief Complaint  Patient presents with   f/u blood pressure  Patient presents today with a chief complaint of emotional distress following the recent passing of her husband and neighbor, as well as f/u of her hypertension.    History of Present Illness:    Lindsay Doyle is a 88 y.o. female with a hx of paroxysmal atrial fibrillation and heart palpitations. The patient had atrial fibrillation in 2014 associated with elevated troponin. Cardiac catheterization demonstrated mild nonobstructive coronary disease. The patient has had significant GI bleeding secondary to AVMs and she has declined further anticoagulant drugs. ED visit 08/20/21 for HTN crisis, evaluated in office by Dr. Burt Knack on 08/20/21, advised to keep a BP log and notify office for elevated readings. She returns today for a follow up visit for her hypertension. She endorses emotional stress surrounding the passing of her husband of 16 years in August of this year, and most recently the passing of a close neighbor. BP was elevated initially 140/80, she was openly weeping and required consolation. She denies CP, SOB, wheezing, orthopnea, presyncope or syncope, or pedal edema. She states she had 1 episode of "heart racing" over the last few months but subsided with taking an extra 1/2 tablet of her metoprolol succinate. She did keep a BP log for some time after her ED visit, but reports she has not checked in "a while" and is no longer keeping a log. She continue to be physically active and participates in an exercise program 5 days/week for 30 minutes a day.   Past Medical History:  Diagnosis Date   ANEMIA-IRON DEFICIENCY    presumed GI bleed 08/2012- anticoag  stopped   Atrial fibrillation ([place])    a. Apixaban started 03/2012 - stopped after admx for GI bleed 6/14   Blood transfusion    a. 05/2010: 3 transfusions for anemia/GIB.   CAD (coronary artery disease)    a. LHC 1/14:  mLAD serial 30 and 60%, oRCA 30%, EF 60%, Afib,    Cataract    COLONIC POLYPS, HX OF 03/09/2007   COPD 03/09/2007   ? pt is unsure   GERD (gastroesophageal reflux disease)    GI bleed    thought to be due to AVMs   Hx of echocardiogram    a. echo 2/11:  EF 55-60%, mild AI, mild RAE, mild to mod TR;   b.  a. Echo 2/14:  Mild focal basal and mild LVH, EF 60-65%, mild AI, mild RAE, trivia effusion   HYPERGLYCEMIA 07/02/2007   Hyperlipidemia    Hypertension 11/12/2006   LEUKOPENIA, MILD 05/30/2008   Mitral valve prolapse    Remotely - last echo 2011 did not show this   NSTEMI (non-ST elevated myocardial infarction) ([place]) 03/2012   a. Type II in setting of AF with RVR 03/2012   OSTEOARTHRITIS 02/19/2009   Spinal   OSTEOPOROSIS 11/12/2006   Positive H. pylori test 10/1996   PSVT    a. Remotely, in setting of anemia.   Spinal stenosis of lumbar region 07/16/2012   Transfusion history    7/15     Past Surgical History:  Procedure Laterality Date   ABDOMINAL HYSTERECTOMY  CARDIAC CATHETERIZATION  1992   S/P in [facility] at [facility] in [place]. This was negative for any coronary artery disease   carotid duplex  08/29/2003   CATARACT EXTRACTION     ceasarean     ENTEROSCOPY N/A 10/10/2013   Procedure: ENTEROSCOPY;  Surgeon: Inda Castle, MD;  Location: [facility] ENDOSCOPY;  Service: Endoscopy;  Laterality: N/A;   ENTEROSCOPY N/A 12/28/2014   Procedure: ENTEROSCOPY;  Surgeon: Inda Castle, MD;  Location: [facility] ENDOSCOPY;  Service: Endoscopy;  Laterality: N/A;   ENTEROSCOPY N/A 01/20/2016   Procedure: ENTEROSCOPY;  Surgeon: Manus Gunning, MD;  Location: [facility] ENDOSCOPY;  Service: Gastroenterology;  Laterality: N/A;   HOT HEMOSTASIS N/A 12/28/2014    Procedure: HOT HEMOSTASIS (ARGON PLASMA COAGULATION/BICAP);  Surgeon: Inda Castle, MD;  Location: [facility];  Service: Endoscopy;  Laterality: N/A;   LEFT HEART CATHETERIZATION WITH CORONARY ANGIOGRAM N/A 04/23/2012   Procedure: LEFT HEART CATHETERIZATION WITH CORONARY ANGIOGRAM;  Surgeon: Larey Dresser, MD;  Location: [facility] CATH LAB;  Service: Cardiovascular;  Laterality: N/A;   TONSILLECTOMY     TUBAL LIGATION      Current Medications: Current Meds  Medication Sig   acetaminophen (TYLENOL) 500 MG tablet Take 2 tablets (1,000 mg total) by mouth 2 (two) times daily.   atorvastatin (LIPITOR) 20 MG tablet TAKE 1 TABLET BY MOUTH EVERY DAY   ferrous sulfate 325 (65 FE) MG tablet Take 325 mg by mouth as needed (constipation).   metoprolol succinate (TOPROL-XL) 50 MG 24 hr tablet Take 1 tablet (50 mg total) by mouth daily. Take with or immediately following a meal.   metoprolol tartrate (LOPRESSOR) 25 MG tablet TAKE 1 TABLET (25 MG TOTAL) BY MOUTH DAILY AS NEEDED (PALPITATIONS).   Multiple Vitamin (MULTIVITAMIN WITH MINERALS) TABS tablet Take 1 tablet by mouth daily. Centrum Silver   nitroGLYCERIN (NITROSTAT) 0.4 MG SL tablet Place 1 tablet (0.4 mg total) under the tongue every 5 (five) minutes as needed for chest pain (maximum of 3 tablets).   [DISCONTINUED] metoprolol succinate (TOPROL-XL) 50 MG 24 hr tablet : TAKE 1.5 TABLETS BY MOUTH DAILY.     Allergies:   Doxycycline, Clarithromycin, and Metronidazole   Social History   Socioeconomic History   Marital status: Widowed    Spouse name: Not on file   Number of children: 1   Years of education: Not on file   Highest education level: Not on file  Occupational History   Occupation: retired    Comment: retired  Tobacco Use   Smoking status: Never   Smokeless tobacco: Never  Vaping Use   Vaping Use: Never used  Substance and Sexual Activity   Alcohol use: No   Drug use: No   Sexual activity: Never  Other Topics Concern   Not on  file  Social History Narrative   Not on file   Social Determinants of Health   Financial Resource Strain: Low Risk  (01/27/2022)   Overall Financial Resource Strain (CARDIA)    Difficulty of Paying Living Expenses: Not very hard  Food Insecurity: No Food Insecurity (01/27/2022)   Hunger Vital Sign    Worried About Running Out of Food in the Last Year: Never true    Ran Out of Food in the Last Year: Never true  Transportation Needs: No Transportation Needs (01/27/2022)   PRAPARE - Hydrologist (Medical): No    Lack of Transportation (Non-Medical): No  Physical Activity: Sufficiently Active (  01/27/2022)   Exercise Vital Sign    Days of Exercise per Week: 5 days    Minutes of Exercise per Session: 50 min  Stress: Stress Concern Present (01/27/2022)   [place]    Feeling of Stress : Rather much  Social Connections: Moderately Integrated (01/27/2022)   Social Connection and Isolation Panel [NHANES]    Frequency of Communication with Friends and Family: More than three times a week    Frequency of Social Gatherings with Friends and Family: More than three times a week    Attends Religious Services: More than 4 times per year    Active Member of [employer] or Organizations: Yes    Attends Archivist Meetings: More than 4 times per year    Marital Status: Widowed     Family History: The patient's family history includes Colon cancer in her brother; Prostate cancer in her brother; Sarcoidosis in her sister and another family member; Stomach cancer in her mother. There is no history of CAD.  ROS:   Please see the history of present illness. Review of Systems  Constitutional: Negative.   HENT: Negative.    Eyes: Negative.   Respiratory:  Negative for cough, shortness of breath and wheezing.   Cardiovascular:  Positive for palpitations (x 1 over the last "few months"). Negative for  chest pain, orthopnea and leg swelling.  Gastrointestinal: Negative.   Genitourinary: Negative.   Musculoskeletal: Negative.   Skin: Negative.   Neurological:  Negative for dizziness and headaches.  Endo/Heme/Allergies: Negative.   Psychiatric/Behavioral:  Positive for memory loss. The patient is nervous/anxious.     All other systems reviewed and are negative.  EKGs/Labs/Other Studies Reviewed:    The following studies were reviewed today:   EKG:  EKG is not ordered today.  The ekg ordered today demonstrates n/a.  Recent Labs: 04/12/2021: ALT 14; TSH 1.16 08/20/2021: BUN 15; Creatinine, Ser 1.00; Hemoglobin 13.3; Platelets 230; Potassium 4.1; Sodium 136  Recent Lipid Panel    Component Value Date/Time   CHOL 186 04/12/2021 1155   TRIG 145.0 04/12/2021 1155   HDL 75.50 04/12/2021 1155   CHOLHDL 2 04/12/2021 1155   VLDL 29.0 04/12/2021 1155   LDLCALC 81 04/12/2021 1155   LDLDIRECT 199.0 09/17/2020 1511     Risk Assessment/Calculations:                Physical Exam:    VS:  BP 128/76 (BP Location: Right Arm, Patient Position: Sitting)   Pulse 78   Ht '5\' 4"'$  (1.626 m)   Wt 105 lb 12.8 oz (48 kg)   SpO2 92%   BMI 18.16 kg/m     Wt Readings from Last 3 Encounters:  02/25/22 105 lb 12.8 oz (48 kg)  08/23/21 112 lb 12.8 oz (51.2 kg)  08/22/21 113 lb (51.3 kg)     GEN: Well nourished, well developed in no acute distress HEENT: Normal NECK: No JVD; No carotid bruits LYMPHATICS: No lymphadenopathy CARDIAC: RRR, 3/6 murmur right side 2nd ICS midclavicular, rubs, gallops RESPIRATORY:  Clear to auscultation without rales, wheezing or rhonchi  ABDOMEN: Soft, non-tender, non-distended MUSCULOSKELETAL:  No edema; No deformity  SKIN: Warm and dry NEUROLOGIC:  Alert and oriented x 3 PSYCHIATRIC:  weeping initially, affect appropriate given circumstances  ASSESSMENT:   Essential hypertension Palpitations Aortic Stenosis  PLAN:    In order of problems listed  above:  Essential hypertension - patient has not been monitoring or  recording her blood pressure. States she is "ok now and taking all my meds". BP elevated initially 140/80 > 128/76. Some confusion about how she is to take her metoprolol succinate and metoprolol succinate. She verbalized that she takes one metoprolol succinate tablet and an extra half of the same one if needed for palpitations. Will adjust her metoprolol succinate to reflect how she is currently taking it to reduce further confusion, and reiterate when to take metoprolol tartrate as needed for palpitations.  Palpitations - she has had one episode of palpitations over the last few months, at that time she took an additional half of her metoprolol succinate as opposed to her tartrate.  Will adjust her metoprolol succinate to reflect how she is currently taking it to reduce further confusion, and reiterate when to take metoprolol tartrate as needed for palpitations.  3.  Aortic stenosis - mild per echo 2021, consider repeat echo at next visit      Medication Adjustments/Labs and Tests Ordered: Current medicines are reviewed at length with the patient today.  Concerns regarding medicines are outlined above.  No orders of the defined types were placed in this encounter.  Meds ordered this encounter  Medications   metoprolol succinate (TOPROL-XL) 50 MG 24 hr tablet    Sig: Take 1 tablet (50 mg total) by mouth daily. Take with or immediately following a meal.    Dispense:  90 tablet    Refill:  3    Patient Instructions  Medication Instructions:  Your physician has recommended you make the following change in your medication:   REDUCE the Metoprolol succincate to 50 mg taking 1 daily.. YOU CAN TAKE THE LOPRESSOR AS NEEDED FOR PALPITATIONS   *If you need a refill on your cardiac medications before your next appointment, please call your pharmacy*   Lab Work: None ordered  If you have labs (blood work) drawn today and your  tests are completely normal, you will receive your results only by: [place] (if you have MyChart) OR A paper copy in the mail If you have any lab test that is abnormal or we need to change your treatment, we will call you to review the results.   Testing/Procedures: None ordered   Follow-Up: At [facility], you and your health needs are our priority.  As part of our continuing mission to provide you with exceptional heart care, we have created designated Provider Care Teams.  These Care Teams include your primary Cardiologist (physician) and Advanced Practice Providers (APPs -  Physician Assistants and Nurse Practitioners) who all work together to provide you with the care you need, when you need it.  We recommend signing up for the patient portal called "MyChart".  Sign up information is provided on this After Visit Summary.  MyChart is used to connect with patients for Virtual Visits (Telemedicine).  Patients are able to view lab/test results, encounter notes, upcoming appointments, etc.  Non-urgent messages can be sent to your provider as well.   To learn more about what you can do with MyChart, go to [email or web address].    Your next appointment:   6 month(s)  The format for your next appointment:   In Person  Provider:   Sherren Mocha, MD     Other Instructions   Important Information About Sugar         Signed, Trudi Ida, NP  02/25/2022 12:36 PM    [place]

## 2022-02-25 NOTE — Telephone Encounter (Signed)
Reviewed message from Venia Carbon, NP with patient. Continue Toprol 75 (1.5 tablets) daily. May take metoprolol 25 mg for palpitations as needed. Patient verbalized understanding.

## 2022-02-25 NOTE — Patient Instructions (Addendum)
Medication Instructions:  Your physician has recommended you make the following change in your medication:   REDUCE the Metoprolol succincate to 50 mg taking 1 daily.. YOU CAN TAKE THE LOPRESSOR AS NEEDED FOR PALPITATIONS   *If you need a refill on your cardiac medications before your next appointment, please call your pharmacy*   Lab Work: None ordered  If you have labs (blood work) drawn today and your tests are completely normal, you will receive your results only by: [place] (if you have MyChart) OR A paper copy in the mail If you have any lab test that is abnormal or we need to change your treatment, we will call you to review the results.   Testing/Procedures: None ordered   Follow-Up: At [facility], you and your health needs are our priority.  As part of our continuing mission to provide you with exceptional heart care, we have created designated Provider Care Teams.  These Care Teams include your primary Cardiologist (physician) and Advanced Practice Providers (APPs -  Physician Assistants and Nurse Practitioners) who all work together to provide you with the care you need, when you need it.  We recommend signing up for the patient portal called "MyChart".  Sign up information is provided on this After Visit Summary.  MyChart is used to connect with patients for Virtual Visits (Telemedicine).  Patients are able to view lab/test results, encounter notes, upcoming appointments, etc.  Non-urgent messages can be sent to your provider as well.   To learn more about what you can do with MyChart, go to [email or web address].    Your next appointment:   6 month(s)  The format for your next appointment:   In Person  Provider:   Sherren Mocha, MD     Other Instructions   Important Information About Sugar

## 2022-02-25 NOTE — Telephone Encounter (Signed)
We were trying to clarify how she was taking her medications. Stay on the toprol 75 and if she needs something for palpitations, take the lopressor 25.  Trudi Ida, NP

## 2022-03-03 ENCOUNTER — Telehealth: Payer: Self-pay | Admitting: Cardiovascular Disease

## 2022-03-03 NOTE — Telephone Encounter (Signed)
Returned call to patient who is very upset. I explained that the NP she saw was attempting to make the chart match how she had been using the Metoprolol Succinate versus tartrate. Patient is adamant that she is not going to make any changes to her regimen. She verified (via pulling her bottle) that she is taking 1 and 1/2 tablet ('75mg'$ ) of Metoprolol Succinate '50mg'$  daily. She states she does not take the Lopressor at all, but states she keeps it by her bedside for "when I have attacks." States she has only had to use this twice. Recently her neighbor committed suicide and this has really upset her. She would like Rx changed back to how she's been using it, not '50mg'$  daily. Sending to MD for final review.

## 2022-03-03 NOTE — Telephone Encounter (Signed)
This is fine. Thank you.

## 2022-03-03 NOTE — Telephone Encounter (Signed)
Pt c/o medication issue:  1. Name of Medication:  Metoprolol succinate 75 mg    2. How are you currently taking this medication (dosage and times per day)? 1.5 tablets daily  3. Are you having a reaction (difficulty breathing--STAT)? no  4. What is your medication issue? Patient states at her last appointment her medication was decreased from 75 mg tablets to 50 mg tablets. She says this change was not made by Dr. Burt Knack and it needs to be deleted from her chart. She says they had no right to change the medication she has been taking for years. She says she even had to take another half tablet last week. She says she will not change the way she takes her medication. She also says she has notified the drug store.

## 2022-03-04 MED ORDER — METOPROLOL SUCCINATE ER 50 MG PO TB24: ORAL_TABLET | ORAL | 3 refills | Status: DC

## 2022-03-04 NOTE — Telephone Encounter (Signed)
Updated chart and sent new rx to [facility] on file with dosing of Metoprolol Succinate '50mg'$  taking 1.5 tablets daily. Placed note on RX to d/c all other rx's on file for this.

## 2022-04-11 ENCOUNTER — Ambulatory Visit (INDEPENDENT_AMBULATORY_CARE_PROVIDER_SITE_OTHER): Payer: Medicare Other | Admitting: Internal Medicine

## 2022-04-11 ENCOUNTER — Encounter: Payer: Self-pay | Admitting: Internal Medicine

## 2022-04-11 VITALS — BP 122/66 | HR 88 | Temp 97.6°F | Ht 64.0 in | Wt 107.0 lb

## 2022-04-11 DIAGNOSIS — E782 Mixed hyperlipidemia: Secondary | ICD-10-CM

## 2022-04-11 DIAGNOSIS — F4321 Adjustment disorder with depressed mood: Secondary | ICD-10-CM | POA: Diagnosis not present

## 2022-04-11 DIAGNOSIS — N1831 Chronic kidney disease, stage 3a: Secondary | ICD-10-CM

## 2022-04-11 DIAGNOSIS — J449 Chronic obstructive pulmonary disease, unspecified: Secondary | ICD-10-CM | POA: Diagnosis not present

## 2022-04-11 DIAGNOSIS — I10 Essential (primary) hypertension: Secondary | ICD-10-CM | POA: Diagnosis not present

## 2022-04-11 DIAGNOSIS — E559 Vitamin D deficiency, unspecified: Secondary | ICD-10-CM | POA: Diagnosis not present

## 2022-04-11 DIAGNOSIS — E538 Deficiency of other specified B group vitamins: Secondary | ICD-10-CM

## 2022-04-11 DIAGNOSIS — R739 Hyperglycemia, unspecified: Secondary | ICD-10-CM | POA: Diagnosis not present

## 2022-04-11 LAB — LIPID PANEL
Cholesterol: 182 mg/dL (ref 0–200)
HDL: 82.6 mg/dL (ref >39.00)
LDL Cholesterol: 77 mg/dL (ref 0–99)
NonHDL: 99.24
Total CHOL/HDL Ratio: 2
Triglycerides: 112 mg/dL (ref 0.0–149.0)
VLDL: 22.4 mg/dL (ref 0.0–40.0)

## 2022-04-11 LAB — URINALYSIS, ROUTINE W REFLEX MICROSCOPIC
Bilirubin Urine: NEGATIVE
Ketones, ur: NEGATIVE
Leukocytes,Ua: NEGATIVE
Nitrite: NEGATIVE
Specific Gravity, Urine: <=1.005 — AB (ref 1.000–1.030)
Total Protein, Urine: NEGATIVE
Urine Glucose: NEGATIVE
Urobilinogen, UA: 0.2 (ref 0.0–1.0)
pH: 6 (ref 5.0–8.0)

## 2022-04-11 LAB — BASIC METABOLIC PANEL
BUN: 16 mg/dL (ref 6–23)
CO2: 28 mEq/L (ref 19–32)
Calcium: 9.9 mg/dL (ref 8.4–10.5)
Chloride: 100 mEq/L (ref 96–112)
Creatinine, Ser: 1.13 mg/dL (ref 0.40–1.20)
GFR: 43.97 mL/min — ABNORMAL LOW (ref >60.00)
Glucose, Bld: 85 mg/dL (ref 70–99)
Potassium: 4.8 mEq/L (ref 3.5–5.1)
Sodium: 137 mEq/L (ref 135–145)

## 2022-04-11 LAB — HEMOGLOBIN A1C: Hgb A1c MFr Bld: 6 % (ref 4.6–6.5)

## 2022-04-11 LAB — CBC WITH DIFFERENTIAL/PLATELET
Basophils Absolute: 0.1 10*3/uL (ref 0.0–0.1)
Basophils Relative: 1.3 % (ref 0.0–3.0)
Eosinophils Absolute: 0.2 10*3/uL (ref 0.0–0.7)
Eosinophils Relative: 3.4 % (ref 0.0–5.0)
HCT: 38.4 % (ref 36.0–46.0)
Hemoglobin: 12.9 g/dL (ref 12.0–15.0)
Lymphocytes Relative: 47.8 % — ABNORMAL HIGH (ref 12.0–46.0)
Lymphs Abs: 2.4 10*3/uL (ref 0.7–4.0)
MCHC: 33.5 g/dL (ref 30.0–36.0)
MCV: 103.6 fl — ABNORMAL HIGH (ref 78.0–100.0)
Monocytes Absolute: 0.7 10*3/uL (ref 0.1–1.0)
Monocytes Relative: 13.8 % — ABNORMAL HIGH (ref 3.0–12.0)
Neutro Abs: 1.7 10*3/uL (ref 1.4–7.7)
Neutrophils Relative %: 33.7 % — ABNORMAL LOW (ref 43.0–77.0)
Platelets: 226 10*3/uL (ref 150.0–400.0)
RBC: 3.71 Mil/uL — ABNORMAL LOW (ref 3.87–5.11)
RDW: 12.9 % (ref 11.5–15.5)
WBC: 5 10*3/uL (ref 4.0–10.5)

## 2022-04-11 LAB — VITAMIN D 25 HYDROXY (VIT D DEFICIENCY, FRACTURES): VITD: 36.7 ng/mL (ref 30.00–100.00)

## 2022-04-11 LAB — HEPATIC FUNCTION PANEL
ALT: 15 U/L (ref 0–35)
AST: 21 U/L (ref 0–37)
Albumin: 4.6 g/dL (ref 3.5–5.2)
Alkaline Phosphatase: 50 U/L (ref 39–117)
Bilirubin, Direct: 0 mg/dL (ref 0.0–0.3)
Total Bilirubin: 0.4 mg/dL (ref 0.2–1.2)
Total Protein: 7.5 g/dL (ref 6.0–8.3)

## 2022-04-11 LAB — VITAMIN B12: Vitamin B-12: 841 pg/mL (ref 211–911)

## 2022-04-11 LAB — TSH: TSH: 0.85 u[IU]/mL (ref 0.35–5.50)

## 2022-04-11 NOTE — Progress Notes (Signed)
Patient ID: Lindsay Doyle, female   DOB: 10-23-1933, 89 y.o.   MRN: [id number]         Chief Complaint:: yearly exam. Hld, htn, low vit d, copd       HPI:  Lindsay Doyle is a 89 y.o. female overall doing ok except sad most of the time in the last 2 mo after husband passed.  Pt denies chest pain, increased sob or doe, wheezing, orthopnea, PND, increased LE swelling, palpitations, dizziness or syncope.  Denies worsening depressive symptoms, suicidal ideation, or panic; has ongoing anxiety, not increased recently.    Pt denies fever, wt loss, night sweats, loss of appetite, or other constitutional symptoms    Wt Readings from Last 3 Encounters:  04/11/22 107 lb (48.5 kg)  02/25/22 105 lb 12.8 oz (48 kg)  08/23/21 112 lb 12.8 oz (51.2 kg)   BP Readings from Last 3 Encounters:  04/11/22 122/66  02/25/22 128/76  08/23/21 (!) 154/96   Immunization History  Administered Date(s) Administered   Covid-19, Mrna,Vaccine(Spikevax)61yr and older 01/06/2022   Fluad Quad(high Dose 65+) 12/23/2018, 01/25/2020, 01/19/2021, 12/26/2021   Influenza Split 03/10/2011, 01/08/2012   Influenza Whole 02/07/2008, 02/05/2009, 02/18/2010   Influenza, High Dose Seasonal PF 01/25/2013, 03/06/2015, 01/16/2017, 01/08/2018   Influenza,inj,Quad PF,6+ Mos 01/25/2014, 01/03/2016   PFIZER Comirnaty(Gray Top)Covid-19 Tri-Sucrose Vaccine 07/30/2020   PFIZER(Purple Top)SARS-COV-2 Vaccination 05/14/2019, 06/06/2019, 02/11/2020   Pneumococcal Conjugate-13 02/08/2013   Pneumococcal Polysaccharide-23 12/29/1997, 01/08/2012   Td 07/29/1996, 07/17/2008   Health Maintenance Due  Topic Date Due   DTaP/Tdap/Td (3 - Tdap) 07/18/2018      Past Medical History:  Diagnosis Date   ANEMIA-IRON DEFICIENCY    presumed GI bleed 08/2012- anticoag stopped   Atrial fibrillation ([place]    a. Apixaban started 03/2012 - stopped after admx for GI bleed 6/14   Blood transfusion    a. 05/2010: 3 transfusions for anemia/GIB.   CAD (coronary  artery disease)    a. LHC 1/14:  mLAD serial 30 and 60%, oRCA 30%, EF 60%, Afib,    Cataract    COLONIC POLYPS, HX OF 03/09/2007   COPD 03/09/2007   ? pt is unsure   GERD (gastroesophageal reflux disease)    GI bleed    thought to be due to AVMs   Hx of echocardiogram    a. echo 2/11:  EF 55-60%, mild AI, mild RAE, mild to mod TR;   b.  a. Echo 2/14:  Mild focal basal and mild LVH, EF 60-65%, mild AI, mild RAE, trivia effusion   HYPERGLYCEMIA 07/02/2007   Hyperlipidemia    Hypertension 11/12/2006   LEUKOPENIA, MILD 05/30/2008   Mitral valve prolapse    Remotely - last echo 2011 did not show this   NSTEMI (non-ST elevated myocardial infarction) ([place] 03/2012   a. Type II in setting of AF with RVR 03/2012   OSTEOARTHRITIS 02/19/2009   Spinal   OSTEOPOROSIS 11/12/2006   Positive H. pylori test 10/1996   PSVT    a. Remotely, in setting of anemia.   Spinal stenosis of lumbar region 07/16/2012   Transfusion history    7/15    Past Surgical History:  Procedure Laterality Date   ABDOMINAL HYSTERECTOMY     CARDIAC CATHETERIZATION  1992   [facility] [place] [place] negative for any [place] artery disease   carotid duplex  08/29/2003   CATARACT EXTRACTION     ceasarean  ENTEROSCOPY N/A 10/10/2013   Procedure: ENTEROSCOPY;  Surgeon: Inda Castle, MD;  Location: [facility] ENDOSCOPY;  Service: Endoscopy;  Laterality: N/A;   ENTEROSCOPY N/A 12/28/2014   Procedure: ENTEROSCOPY;  Surgeon: Inda Castle, MD;  Location: [facility] ENDOSCOPY;  Service: Endoscopy;  Laterality: N/A;   ENTEROSCOPY N/A 01/20/2016   Procedure: ENTEROSCOPY;  Surgeon: Manus Gunning, MD;  Location: [facility] ENDOSCOPY;  Service: Gastroenterology;  Laterality: N/A;   HOT HEMOSTASIS N/A 12/28/2014   Procedure: HOT HEMOSTASIS (ARGON PLASMA COAGULATION/BICAP);  Surgeon: Inda Castle, MD;  Location: [facility];  Service: Endoscopy;  Laterality: N/A;   LEFT HEART CATHETERIZATION WITH CORONARY  ANGIOGRAM N/A 04/23/2012   Procedure: LEFT HEART CATHETERIZATION WITH CORONARY ANGIOGRAM;  Surgeon: Larey Dresser, MD;  [facility];  Service: Cardiovascular;  Laterality: N/A;   TONSILLECTOMY     TUBAL LIGATION      reports that she has never smoked. She has never used smokeless tobacco. She reports that she does not drink alcohol and does not use drugs. family history includes Colon cancer in her brother; Prostate cancer in her brother; Sarcoidosis in her sister and another family member; Stomach cancer in her mother. Allergies  Allergen Reactions   Doxycycline Diarrhea    Possible diarrhea   Clarithromycin Other (See Comments)    diarrhea   Metronidazole Other (See Comments)    Pt had difficulty swallowing this and says it was "horrible" to take. No allergic reaction.   Current Outpatient Medications on File Prior to Visit  Medication Sig Dispense Refill   acetaminophen (TYLENOL) 500 MG tablet Take 2 tablets (1,000 mg total) by mouth 2 (two) times daily. 60 tablet 11   atorvastatin (LIPITOR) 20 MG tablet TAKE 1 TABLET BY MOUTH EVERY DAY 90 tablet 3   ferrous sulfate 325 (65 FE) MG tablet Take 325 mg by mouth as needed (constipation).     metoprolol succinate (TOPROL-XL) 50 MG 24 hr tablet Take 1 and 1/2 tablets (75 mg) by mouth daily 135 tablet 3   metoprolol tartrate (LOPRESSOR) 25 MG tablet TAKE 1 TABLET (25 MG TOTAL) BY MOUTH DAILY AS NEEDED (PALPITATIONS). 90 tablet 3   Multiple Vitamin (MULTIVITAMIN WITH MINERALS) TABS tablet Take 1 tablet by mouth daily. Centrum Silver     nitroGLYCERIN (NITROSTAT) 0.4 MG SL tablet Place 1 tablet (0.4 mg total) under the tongue every 5 (five) minutes as needed for chest pain (maximum of 3 tablets). 25 tablet 4   No current facility-administered medications on file prior to visit.        ROS:  All others reviewed and negative.  Objective        PE:  BP 122/66 (BP Location: Left Arm, Patient Position: Sitting, Cuff Size: Large)    Pulse 88   Temp 97.6 F (36.4 C) (Oral)   Ht '5\' 4"'$  (1.626 m)   Wt 107 lb (48.5 kg)   SpO2 97%   BMI 18.37 kg/m                 Constitutional: Pt appears in NAD               HENT: Head: NCAT.                Right Ear: External ear normal.                 Left Ear: External ear normal.  Eyes: . Pupils are equal, round, and reactive to light. Conjunctivae and EOM are normal               Nose: without d/c or deformity               Neck: Neck supple. Gross normal ROM               Cardiovascular: Normal rate and regular rhythm.                 Pulmonary/Chest: Effort normal and breath sounds without rales or wheezing.                Abd:  Soft, NT, ND, + BS, no organomegaly               Neurological: Pt is alert. At baseline orientation, motor grossly intact               Skin: Skin is warm. No rashes, no other new lesions, LE edema - none               Psychiatric: Pt behavior is normal without agitation , sad demeanor  Micro: none  Cardiac tracings I have personally interpreted today:  none  Pertinent Radiological findings (summarize): none   Lab Results  Component Value Date   WBC 5.2 08/20/2021   HGB 13.3 08/20/2021   HCT 39.1 08/20/2021   PLT 230 08/20/2021   GLUCOSE 124 (H) 08/20/2021   CHOL 186 04/12/2021   TRIG 145.0 04/12/2021   HDL 75.50 04/12/2021   LDLDIRECT 199.0 09/17/2020   LDLCALC 81 04/12/2021   ALT 14 04/12/2021   AST 19 04/12/2021   NA 136 08/20/2021   K 4.1 08/20/2021   CL 101 08/20/2021   CREATININE 1.00 08/20/2021   BUN 15 08/20/2021   CO2 23 08/20/2021   TSH 1.16 04/12/2021   INR 1.06 01/20/2016   HGBA1C 6.1 04/12/2021   Assessment/Plan:  Lindsay Doyle is a 89 y.o. Black or African American [2] female with  has a past medical history of ANEMIA-IRON DEFICIENCY, Atrial fibrillation ([place]), Blood transfusion, CAD (coronary artery disease), Cataract, COLONIC POLYPS, HX OF (03/09/2007), COPD (03/09/2007), GERD (gastroesophageal  reflux disease), GI bleed, echocardiogram, HYPERGLYCEMIA (07/02/2007), Hyperlipidemia, Hypertension (11/12/2006), LEUKOPENIA, MILD (05/30/2008), Mitral valve prolapse, NSTEMI (non-ST elevated myocardial infarction) ([place]) (03/2012), OSTEOARTHRITIS (02/19/2009), OSTEOPOROSIS (11/12/2006), Positive H. pylori test (10/1996), PSVT, Spinal stenosis of lumbar region (07/16/2012), and Transfusion history.  COPD (chronic obstructive pulmonary disease) (HCC) Stable, cont inhaler prn  CKD (chronic kidney disease), stage III Lab Results  Component Value Date   CREATININE 1.00 08/20/2021   Stable overall, cont to avoid nephrotoxins   Essential hypertension BP Readings from Last 3 Encounters:  04/11/22 122/66  02/25/22 128/76  08/23/21 (!) 154/96   Stable, pt to continue medical treatment toprol xl 75 qd   Hyperglycemia Lab Results  Component Value Date   HGBA1C 6.1 04/12/2021   Stable, pt to continue current medical treatment  - diet, wt control   Hyperlipidemia Lab Results  Component Value Date   [place] 81 04/12/2021   Uncontrolled, goal ldl < 70, pt to continue current statin liptior 20 mg qd, declines change for now, for f/u lab today   Vitamin D deficiency Last vitamin D Lab Results  Component Value Date   VD25OH 40.12 04/12/2021   Stable, cont oral replacement   Grief D/w pt, denies worsening depresison, declines referral for counseling  Followup: No follow-ups on file.  Cathlean Cower, MD 04/11/2022 12:53 PM [facility]

## 2022-04-11 NOTE — Patient Instructions (Signed)

## 2022-04-11 NOTE — Assessment & Plan Note (Signed)
D/w pt, denies worsening depresison, declines referral for counseling

## 2022-04-11 NOTE — Assessment & Plan Note (Signed)
Stable, cont inhaler prn

## 2022-04-11 NOTE — Assessment & Plan Note (Signed)
BP Readings from Last 3 Encounters:  04/11/22 122/66  02/25/22 128/76  08/23/21 (!) 154/96   Stable, pt to continue medical treatment toprol xl 75 qd

## 2022-04-11 NOTE — Assessment & Plan Note (Signed)
Lab Results  Component Value Date   LDLCALC 81 04/12/2021   Uncontrolled, goal ldl < 70, pt to continue current statin liptior 20 mg qd, declines change for now, for f/u lab today

## 2022-04-11 NOTE — Assessment & Plan Note (Signed)
Lab Results  Component Value Date   CREATININE 1.00 08/20/2021   Stable overall, cont to avoid nephrotoxins

## 2022-04-11 NOTE — Assessment & Plan Note (Signed)
Lab Results  Component Value Date   HGBA1C 6.1 04/12/2021   Stable, pt to continue current medical treatment  - diet, wt control

## 2022-04-11 NOTE — Assessment & Plan Note (Signed)
Last vitamin D Lab Results  Component Value Date   VD25OH 40.12 04/12/2021   Stable, cont oral replacement

## 2022-09-01 ENCOUNTER — Encounter: Payer: Self-pay | Admitting: Cardiovascular Disease

## 2022-09-01 ENCOUNTER — Ambulatory Visit: Payer: Medicare Other | Attending: Cardiovascular Disease | Admitting: Cardiovascular Disease

## 2022-09-01 VITALS — BP 160/100 | HR 87 | Ht 64.0 in | Wt 102.6 lb

## 2022-09-01 DIAGNOSIS — I480 Paroxysmal atrial fibrillation: Secondary | ICD-10-CM | POA: Diagnosis not present

## 2022-09-01 DIAGNOSIS — I10 Essential (primary) hypertension: Secondary | ICD-10-CM | POA: Insufficient documentation

## 2022-09-01 NOTE — Patient Instructions (Signed)
Medication Instructions:  Your physician recommends that you continue on your current medications as directed. Please refer to the Current Medication list given to you today.  *If you need a refill on your cardiac medications before your next appointment, please call your pharmacy*   Lab Work: NONE If you have labs (blood work) drawn today and your tests are completely normal, you will receive your results only by: MyChart Message (if you have MyChart) OR A paper copy in the mail If you have any lab test that is abnormal or we need to change your treatment, we will call you to review the results.   Testing/Procedures: NONE   Follow-Up: At [facility], you and your health needs are our priority.  As part of our continuing mission to provide you with exceptional heart care, we have created designated Provider Care Teams.  These Care Teams include your primary Cardiologist (physician) and Advanced Practice Providers (APPs -  Physician Assistants and Nurse Practitioners) who all work together to provide you with the care you need, when you need it.  We recommend signing up for the patient portal called "MyChart".  Sign up information is provided on this After Visit Summary.  MyChart is used to connect with patients for Virtual Visits (Telemedicine).  Patients are able to view lab/test results, encounter notes, upcoming appointments, etc.  Non-urgent messages can be sent to your provider as well.   To learn more about what you can do with MyChart, go to [email or web address].    Your next appointment:   6 month(s)  Provider:   Tonny Bollman, MD       Other Instructions **Please check blood pressure at home 2-3 times weekly and call us if trending greater than 140/76mmhg**

## 2022-09-01 NOTE — Progress Notes (Signed)
Cardiology Office Note:    Date:  09/01/2022   ID:  Lindsay Doyle, Lindsay Doyle 1934-02-29, MRN [id number]  PCP:  Lindsay Levins, MD   [facility] Providers Cardiologist:  Lindsay Bollman, MD Electrophysiologist:  Lindsay Prude, MD     Referring MD: Lindsay Levins, MD   Chief Complaint  Patient presents with   Follow-up    Hypertension    History of Present Illness:    Lindsay Doyle is a 89 y.o. female with a hx of paroxysmal atrial fibrillation, presenting for follow-up evaluation.  The patient presented in 2014 with atrial fibrillation and was found to have elevated troponin at that time.  Cardiac catheterization was performed and demonstrated mild nonobstructive CAD.  The patient has a history of gastrointestinal bleeding from AVMs and she declined further anticoagulation now over several years.  She was last seen here 1 year ago.  The patient is here alone today.  She has been doing okay.  She continues to feel lonely after the death of her husband last year.  They were married for 64 years.  She has 3 relatives in town who provide her a good support system.  Her son is in the [employer] and he is in [place].  She continues to do an exercise class 5 days/week.  She denies chest pain, chest pressure, or heart palpitations.  No leg swelling, orthopnea, or PND.  Past Medical History:  Diagnosis Date   ANEMIA-IRON DEFICIENCY    presumed GI bleed 08/2012- anticoag stopped   Atrial fibrillation (HCC)    a. Apixaban started 03/2012 - stopped after admx for GI bleed 6/14   Blood transfusion    a. 05/2010: 3 transfusions for anemia/GIB.   CAD (coronary artery disease)    a. LHC 1/14:  mLAD serial 30 and 60%, oRCA 30%, EF 60%, Afib,    Cataract    COLONIC POLYPS, HX OF 03/09/2007   COPD 03/09/2007   ? pt is unsure   GERD (gastroesophageal reflux disease)    GI bleed    thought to be due to AVMs   Hx of echocardiogram    a. echo 2/11:  EF 55-60%, mild AI, mild RAE, mild to mod TR;    b.  a. Echo 2/14:  Mild focal basal and mild LVH, EF 60-65%, mild AI, mild RAE, trivia effusion   HYPERGLYCEMIA 07/02/2007   Hyperlipidemia    Hypertension 11/12/2006   LEUKOPENIA, MILD 05/30/2008   Mitral valve prolapse    Remotely - last echo 2011 did not show this   NSTEMI (non-ST elevated myocardial infarction) (HCC) 03/2012   a. Type II in setting of AF with RVR 03/2012   OSTEOARTHRITIS 02/19/2009   Spinal   OSTEOPOROSIS 11/12/2006   Positive H. pylori test 10/1996   PSVT    a. Remotely, in setting of anemia.   Spinal stenosis of lumbar region 07/16/2012   Transfusion history    7/15     Past Surgical History:  Procedure Laterality Date   ABDOMINAL HYSTERECTOMY     CARDIAC CATHETERIZATION  1992   S/P in 1992 at [facility] in [place]. This was negative for any coronary artery disease   carotid duplex  08/29/2003   CATARACT EXTRACTION     ceasarean     ENTEROSCOPY N/A 10/10/2013   Procedure: ENTEROSCOPY;  Surgeon: Lindsay Meckel, MD;  Location: [facility] ENDOSCOPY;  Service: Endoscopy;  Laterality: N/A;   ENTEROSCOPY N/A 12/28/2014  Procedure: ENTEROSCOPY;  Surgeon: Lindsay Meckel, MD;  Location: [facility] ENDOSCOPY;  Service: Endoscopy;  Laterality: N/A;   ENTEROSCOPY N/A 01/20/2016   Procedure: ENTEROSCOPY;  Surgeon: Lindsay Frederick, MD;  Location: [facility] ENDOSCOPY;  Service: Gastroenterology;  Laterality: N/A;   HOT HEMOSTASIS N/A 12/28/2014   Procedure: HOT HEMOSTASIS (ARGON PLASMA COAGULATION/BICAP);  Surgeon: Lindsay Meckel, MD;  Location: [facility] ENDOSCOPY;  Service: Endoscopy;  Laterality: N/A;   LEFT HEART CATHETERIZATION WITH CORONARY ANGIOGRAM N/A 04/23/2012   Procedure: LEFT HEART CATHETERIZATION WITH CORONARY ANGIOGRAM;  Surgeon: Lindsay Morale, MD;  Location: [facility] CATH LAB;  Service: Cardiovascular;  Laterality: N/A;   TONSILLECTOMY     TUBAL LIGATION      Current Medications: Current Meds  Medication Sig   acetaminophen (TYLENOL) 500 MG tablet Take 2  tablets (1,000 mg total) by mouth 2 (two) times daily.   atorvastatin (LIPITOR) 20 MG tablet TAKE 1 TABLET BY MOUTH EVERY DAY   metoprolol succinate (TOPROL-XL) 50 MG 24 hr tablet Take 1 and 1/2 tablets (75 mg) by mouth daily   metoprolol tartrate (LOPRESSOR) 25 MG tablet TAKE 1 TABLET (25 MG TOTAL) BY MOUTH DAILY AS NEEDED (PALPITATIONS).   Multiple Vitamin (MULTIVITAMIN WITH MINERALS) TABS tablet Take 1 tablet by mouth daily. Centrum Silver   nitroGLYCERIN (NITROSTAT) 0.4 MG SL tablet Place 1 tablet (0.4 mg total) under the tongue every 5 (five) minutes as needed for chest pain (maximum of 3 tablets).   [DISCONTINUED] ferrous sulfate 325 (65 FE) MG tablet Take 325 mg by mouth as needed (constipation).     Allergies:   Doxycycline, Clarithromycin, and Metronidazole   Social History   Socioeconomic History   Marital status: Widowed    Spouse name: Not on file   Number of children: 1   Years of education: Not on file   Highest education level: Not on file  Occupational History   Occupation: retired    Comment: retired  Tobacco Use   Smoking status: Never   Smokeless tobacco: Never  Vaping Use   Vaping Use: Never used  Substance and Sexual Activity   Alcohol use: No   Drug use: No   Sexual activity: Never  Other Topics Concern   Not on file  Social History Narrative   Not on file   Social Determinants of Health   Financial Resource Strain: Low Risk  (01/27/2022)   Overall Financial Resource Strain (CARDIA)    Difficulty of Paying Living Expenses: Not very hard  Food Insecurity: No Food Insecurity (01/27/2022)   Hunger Vital Sign    Worried About Running Out of Food in the Last Year: Never true    Ran Out of Food in the Last Year: Never true  Transportation Needs: No Transportation Needs (01/27/2022)   PRAPARE - Administrator, Civil Service (Medical): No    Lack of Transportation (Non-Medical): No  Physical Activity: Sufficiently Active (01/27/2022)    Exercise Vital Sign    Days of Exercise per Week: 5 days    Minutes of Exercise per Session: 50 min  Stress: Stress Concern Present (01/27/2022)   [employer] of Occupational Health - Occupational Stress Questionnaire    Feeling of Stress : Rather much  Social Connections: Moderately Integrated (01/27/2022)   Social Connection and Isolation Panel [NHANES]    Frequency of Communication with Friends and Family: More than three times a week    Frequency of Social Gatherings with Friends and Family: More than three times a  week    Attends Religious Services: More than 4 times per year    Active Member of Clubs or Organizations: Yes    Attends Banker Meetings: More than 4 times per year    Marital Status: Widowed     Family History: The patient's family history includes Colon cancer in her brother; Prostate cancer in her brother; Sarcoidosis in her sister and another family member; Stomach cancer in her mother. There is no history of CAD.  ROS:   Please see the history of present illness.    All other systems reviewed and are negative.  EKGs/Labs/Other Studies Reviewed:    The following studies were reviewed today: Cardiac Studies & Procedures       ECHOCARDIOGRAM  ECHOCARDIOGRAM COMPLETE 12/10/2019  Narrative ECHOCARDIOGRAM REPORT    Patient Name:   Rechelle Niebla Litter Date of Exam: 12/10/2019 Medical Rec #:  [id number]        Height:       64.0 in Accession #:    [id number]       Weight:       113.1 lb Date of Birth:  Jan 15, 1934         BSA:          1.535 m Patient Age:    86 years         BP:           165/75 mmHg Patient Gender: F                HR:           96 bpm. Exam Location:  Inpatient  Procedure: 2D Echo  Indications:    Abnormal EKG  History:        Patient has prior history of Echocardiogram examinations, most recent 01/20/2016. Paroxysmal SVT, CAD, chronic kidney disease 3A, hypertension, Hyperlipidemia, GERD.  Sonographer:    Leta Jungling RDCS Referring Phys: [id number] MAURICIO DANIEL ARRIEN  IMPRESSIONS   1. Left ventricular ejection fraction, by estimation, is 60 to 65%. The left ventricle has normal function. The left ventricle has no regional wall motion abnormalities. There is moderate left ventricular hypertrophy of the basal-septal segment. Left ventricular diastolic parameters are consistent with Grade I diastolic dysfunction (impaired relaxation). Elevated left ventricular end-diastolic pressure. 2. Right ventricular systolic function is mildly reduced. The right ventricular size is normal. There is normal pulmonary artery systolic pressure. The estimated right ventricular systolic pressure is 20.5 mmHg. 3. The mitral valve is normal in structure. No evidence of mitral valve regurgitation. No evidence of mitral stenosis. 4. The aortic valve is tricuspid. There is moderate calcification of the aortic valve. There is moderate thickening of the aortic valve. Aortic valve regurgitation is mild. Mild aortic valve stenosis. Aortic regurgitation PHT measures 511 msec. Aortic valve area, by VTI measures 1.70 cm. Aortic valve mean gradient measures 28.0 mmHg. Aortic valve Vmax measures 3.15 m/s. 5. The inferior vena cava is normal in size with greater than 50% respiratory variability, suggesting right atrial pressure of 3 mmHg.  FINDINGS Left Ventricle: Left ventricular ejection fraction, by estimation, is 60 to 65%. The left ventricle has normal function. The left ventricle has no regional wall motion abnormalities. The left ventricular internal cavity size was normal in size. There is moderate left ventricular hypertrophy of the basal-septal segment. Left ventricular diastolic parameters are consistent with Grade I diastolic dysfunction (impaired relaxation). Elevated left ventricular end-diastolic pressure.  Right Ventricle: The right ventricular size is normal. No increase  in right ventricular wall thickness. Right  ventricular systolic function is mildly reduced. There is normal pulmonary artery systolic pressure. The tricuspid regurgitant velocity is 2.09 m/s, and with an assumed right atrial pressure of 3 mmHg, the estimated right ventricular systolic pressure is 20.5 mmHg.  Left Atrium: Left atrial size was normal in size.  Right Atrium: Right atrial size was normal in size.  Pericardium: There is no evidence of pericardial effusion.  Mitral Valve: The mitral valve is normal in structure. Mild to moderate mitral annular calcification. No evidence of mitral valve regurgitation. No evidence of mitral valve stenosis.  Tricuspid Valve: The tricuspid valve is normal in structure. Tricuspid valve regurgitation is mild . No evidence of tricuspid stenosis.  Aortic Valve: The aortic valve is tricuspid. There is moderate calcification of the aortic valve. There is moderate thickening of the aortic valve. Aortic valve regurgitation is mild. Aortic regurgitation PHT measures 511 msec. Mild aortic stenosis is present. Aortic valve mean gradient measures 28.0 mmHg. Aortic valve peak gradient measures 39.7 mmHg. Aortic valve area, by VTI measures 1.70 cm.  Pulmonic Valve: The pulmonic valve was normal in structure. Pulmonic valve regurgitation is not visualized. No evidence of pulmonic stenosis.  Aorta: The aortic root is normal in size and structure.  Venous: The inferior vena cava is normal in size with greater than 50% respiratory variability, suggesting right atrial pressure of 3 mmHg.  IAS/Shunts: No atrial level shunt detected by color flow Doppler.   LEFT VENTRICLE PLAX 2D LVIDd:         2.90 cm  Diastology LVIDs:         1.70 cm  LV e' medial:    2.76 cm/s LV PW:         1.10 cm  LV E/e' medial:  23.8 LV IVS:        1.30 cm  LV e' lateral:   2.55 cm/s LVOT diam:     1.54 cm  LV E/e' lateral: 25.7 LV SV:         26 LV SV Index:   17 LVOT Area:     1.86 cm   RIGHT VENTRICLE RV S prime:      7.43 cm/s TAPSE (M-mode): 1.4 cm  LEFT ATRIUM             Index       RIGHT ATRIUM           Index LA diam:        2.80 cm 1.82 cm/m  RA Area:     12.50 cm LA Vol (A2C):   22.5 ml 14.65 ml/m RA Volume:   26.60 ml  17.32 ml/m LA Vol (A4C):   27.8 ml 18.11 ml/m LA Biplane Vol: 25.3 ml 16.48 ml/m AORTIC VALVE AV Area (Vmax):    0.48 cm AV Area (VTI):     1.70 cm AV Vmax:           315.00 cm/s AV VTI:            0.150 m AV Peak Grad:      39.7 mmHg AV Mean Grad:      28.0 mmHg LVOT Vmax:         82.00 cm/s LVOT Vmean:        50.300 cm/s LVOT VTI:          0.137 m LVOT/AV VTI ratio: 0.91 AI PHT:            511 msec  AORTA  Ao Root diam: 3.20 cm  MITRAL VALVE                TRICUSPID VALVE MV Area (PHT): 2.32 cm     TR Peak grad:   17.5 mmHg MV Decel Time: 327 msec     TR Vmax:        209.00 cm/s MV E velocity: 65.60 cm/s MV A velocity: 100.00 cm/s  SHUNTS MV E/A ratio:  0.66         Systemic VTI:  0.14 m Systemic Diam: 1.54 cm  Armanda Magic MD Electronically signed by Armanda Magic MD Signature Date/Time: 12/10/2019/3:56:15 PM    Final    MONITORS  LONG TERM MONITOR (3-14 DAYS) 01/19/2020  Narrative HR 57-197, average 77. 5 episodes of SVT, longest lasting 6 beats with a rate of 197bpm. Rare ventricular ectopy.  Sheria Lang T. Lalla Brothers, MD, [facility] Cardiac Electrophysiology            EKG:  EKG is ordered today.  The ekg ordered today demonstrates normal sinus rhythm 87 bpm, within normal limits.  Is okay with  Recent Labs: 04/11/2022: ALT 15; BUN 16; Creatinine, Ser 1.13; Hemoglobin 12.9; Platelets 226.0; Potassium 4.8; Sodium 137; TSH 0.85  Recent Lipid Panel    Component Value Date/Time   CHOL 182 04/11/2022 1145   TRIG 112.0 04/11/2022 1145   HDL 82.60 04/11/2022 1145   CHOLHDL 2 04/11/2022 1145   VLDL 22.4 04/11/2022 1145   LDLCALC 77 04/11/2022 1145   LDLDIRECT 199.0 09/17/2020 1511     Risk Assessment/Calculations:    CHA2DS2-VASc Score = 4    This indicates a 4.8% annual risk of stroke. The patient's score is based upon: CHF History: 0 HTN History: 1 Diabetes History: 0 Stroke History: 0 Vascular Disease History: 0 Age Score: 2 Gender Score: 1           Physical Exam:    VS:  BP (!) 160/100 Comment: Right arm 120/90  Pulse 87   Ht 5\' 4"  (1.626 m)   Wt 102 lb 9.6 oz (46.5 kg)   SpO2 95%   BMI 17.61 kg/m     Wt Readings from Last 3 Encounters:  09/01/22 102 lb 9.6 oz (46.5 kg)  04/11/22 107 lb (48.5 kg)  02/25/22 105 lb 12.8 oz (48 kg)     GEN:  Well nourished, well developed elderly woman in no acute distress HEENT: Normal NECK: No JVD; No carotid bruits LYMPHATICS: No lymphadenopathy CARDIAC: RRR, no murmurs, rubs, gallops RESPIRATORY:  Clear to auscultation without rales, wheezing or rhonchi  ABDOMEN: Soft, non-tender, non-distended MUSCULOSKELETAL:  No edema; No deformity  SKIN: Warm and dry NEUROLOGIC:  Alert and oriented x 3 PSYCHIATRIC:  Normal affect   ASSESSMENT:    1. Paroxysmal atrial fibrillation (HCC)   2. Essential hypertension    PLAN:    In order of problems listed above:  Maintaining sinus rhythm.  She has not had any atrial fibrillation documented in many years.  She has not been on anticoagulation due to history of bleeding in the past.  Continue current management. I repeated her blood pressure and got a reading of 120/84.  I asked her to monitor her blood pressure at home at least a few days per week and call if she is seeing readings greater than 140/90 mmHg.     Medication Adjustments/Labs and Tests Ordered: Current medicines are reviewed at length with the patient today.  Concerns regarding medicines are outlined above.  Orders  Placed This Encounter  Procedures   EKG 12-Lead   No orders of the defined types were placed in this encounter.   Patient Instructions  Medication Instructions:  Your physician recommends that you continue on your current medications as  directed. Please refer to the Current Medication list given to you today.  *If you need a refill on your cardiac medications before your next appointment, please call your pharmacy*   Lab Work: NONE If you have labs (blood work) drawn today and your tests are completely normal, you will receive your results only by: MyChart Message (if you have MyChart) OR A paper copy in the mail If you have any lab test that is abnormal or we need to change your treatment, we will call you to review the results.   Testing/Procedures: NONE   Follow-Up: At [facility], you and your health needs are our priority.  As part of our continuing mission to provide you with exceptional heart care, we have created designated Provider Care Teams.  These Care Teams include your primary Cardiologist (physician) and Advanced Practice Providers (APPs -  Physician Assistants and Nurse Practitioners) who all work together to provide you with the care you need, when you need it.  We recommend signing up for the patient portal called "MyChart".  Sign up information is provided on this After Visit Summary.  MyChart is used to connect with patients for Virtual Visits (Telemedicine).  Patients are able to view lab/test results, encounter notes, upcoming appointments, etc.  Non-urgent messages can be sent to your provider as well.   To learn more about what you can do with MyChart, go to [email or web address].    Your next appointment:   6 month(s)  Provider:   Tonny Bollman, MD       Other Instructions **Please check blood pressure at home 2-3 times weekly and call us if trending greater than 140/59mmhg**   Signed, Lindsay Bollman, MD  09/01/2022 4:07 PM    [facility]

## 2022-10-10 ENCOUNTER — Ambulatory Visit: Payer: Medicare Other | Admitting: Internal Medicine

## 2022-10-10 ENCOUNTER — Encounter: Payer: Self-pay | Admitting: Internal Medicine

## 2022-10-10 ENCOUNTER — Ambulatory Visit (INDEPENDENT_AMBULATORY_CARE_PROVIDER_SITE_OTHER): Payer: Medicare Other | Admitting: Internal Medicine

## 2022-10-10 VITALS — BP 120/78 | HR 90 | Temp 98.2°F | Ht 64.0 in | Wt 103.0 lb

## 2022-10-10 DIAGNOSIS — I10 Essential (primary) hypertension: Secondary | ICD-10-CM | POA: Diagnosis not present

## 2022-10-10 DIAGNOSIS — M1990 Unspecified osteoarthritis, unspecified site: Secondary | ICD-10-CM | POA: Diagnosis not present

## 2022-10-10 DIAGNOSIS — N3281 Overactive bladder: Secondary | ICD-10-CM

## 2022-10-10 DIAGNOSIS — F4321 Adjustment disorder with depressed mood: Secondary | ICD-10-CM | POA: Diagnosis not present

## 2022-10-10 NOTE — Patient Instructions (Signed)
Ok to use the OTC Voltaren gel as needed for the finger joint pains  Please call if you change your mind about the Overactive Bladder medication start  .Please continue all other medications as before, and refills have been done if requested.  Please have the pharmacy call with any other refills you may need.  Please keep your appointments with your specialists as you may have planned  No further lab work needed today  Please make an Appointment to return in 6 months, or sooner if needed

## 2022-10-10 NOTE — Progress Notes (Signed)
Patient ID: Lindsay Doyle, female   DOB: 02/12/1934, 89 y.o.   MRN: [id number]        Chief Complaint: follow up OAB, htn, hand arthritis bilateral, grief       HPI:  Lindsay Doyle is a 89 y.o. female here with c/o urinary frequency x months and Has nocturia x 3-4 times per night, but declines med tx fo OAB for now.  Denies urinary symptoms such as dysuria, urgency, flank pain, hematuria or n/v, fever, chills.  Pt denies chest pain, increased sob or doe, wheezing, orthopnea, PND, increased LE swelling, palpitations, dizziness or syncope.   Pt denies polydipsia, polyuria, or new focal neuro s/s.    Pt denies fever, wt loss, night sweats, loss of appetite, or other constitutional symptoms   Husband passed Nov 01 2021 and still grieving sad but Denies worsening depressive symptoms, suicidal ideation, or panic.   Wt Readings from Last 3 Encounters:  10/10/22 103 lb (46.7 kg)  09/01/22 102 lb 9.6 oz (46.5 kg)  04/11/22 107 lb (48.5 kg)   BP Readings from Last 3 Encounters:  10/10/22 120/78  09/01/22 (!) 160/100  04/11/22 122/66         Past Medical History:  Diagnosis Date   ANEMIA-IRON DEFICIENCY    presumed GI bleed 08/2012- anticoag stopped   Atrial fibrillation (HCC)    a. Apixaban started 03/2012 - stopped after admx for GI bleed 6/14   Blood transfusion    a. 05/2010: 3 transfusions for anemia/GIB.   CAD (coronary artery disease)    a. LHC 1/14:  mLAD serial 30 and 60%, oRCA 30%, EF 60%, Afib,    Cataract    COLONIC POLYPS, HX OF 03/09/2007   COPD 03/09/2007   ? pt is unsure   GERD (gastroesophageal reflux disease)    GI bleed    thought to be due to AVMs   Hx of echocardiogram    a. echo 2/11:  EF 55-60%, mild AI, mild RAE, mild to mod TR;   b.  a. Echo 2/14:  Mild focal basal and mild LVH, EF 60-65%, mild AI, mild RAE, trivia effusion   HYPERGLYCEMIA 07/02/2007   Hyperlipidemia    Hypertension 11/12/2006   LEUKOPENIA, MILD 05/30/2008   Mitral valve prolapse    Remotely - last  echo 2011 did not show this   NSTEMI (non-ST elevated myocardial infarction) (HCC) 03/2012   a. Type II in setting of AF with RVR 03/2012   OSTEOARTHRITIS 02/19/2009   Spinal   OSTEOPOROSIS 11/12/2006   Positive H. pylori test 10/1996   PSVT    a. Remotely, in setting of anemia.   Spinal stenosis of lumbar region 07/16/2012   Transfusion history    7/15    Past Surgical History:  Procedure Laterality Date   ABDOMINAL HYSTERECTOMY     CARDIAC CATHETERIZATION  1992   S/P in 1992 at [facility] in [place]. This was negative for any coronary artery disease   carotid duplex  08/29/2003   CATARACT EXTRACTION     ceasarean     ENTEROSCOPY N/A 10/10/2013   Procedure: ENTEROSCOPY;  Surgeon: Louis Meckel, MD;  Location: [facility] ENDOSCOPY;  Service: Endoscopy;  Laterality: N/A;   ENTEROSCOPY N/A 12/28/2014   Procedure: ENTEROSCOPY;  Surgeon: Louis Meckel, MD;  Location: [facility] ENDOSCOPY;  Service: Endoscopy;  Laterality: N/A;   ENTEROSCOPY N/A 01/20/2016   Procedure: ENTEROSCOPY;  Surgeon: Ruffin Frederick, MD;  Location: [facility]  ENDOSCOPY;  Service: Gastroenterology;  Laterality: N/A;   HOT HEMOSTASIS N/A 12/28/2014   Procedure: HOT HEMOSTASIS (ARGON PLASMA COAGULATION/BICAP);  Surgeon: Louis Meckel, MD;  Location: [facility];  Service: Endoscopy;  Laterality: N/A;   LEFT HEART CATHETERIZATION WITH CORONARY ANGIOGRAM N/A 04/23/2012   Procedure: LEFT HEART CATHETERIZATION WITH CORONARY ANGIOGRAM;  Surgeon: Laurey Morale, MD;  Location: [facility] CATH LAB;  Service: Cardiovascular;  Laterality: N/A;   TONSILLECTOMY     TUBAL LIGATION      reports that she has never smoked. She has never used smokeless tobacco. She reports that she does not drink alcohol and does not use drugs. family history includes Colon cancer in her brother; Prostate cancer in her brother; Sarcoidosis in her sister and another family member; Stomach cancer in her mother. Allergies  Allergen Reactions    Doxycycline Diarrhea    Possible diarrhea   Clarithromycin Other (See Comments)    diarrhea   Metronidazole Other (See Comments)    Pt had difficulty swallowing this and says it was "horrible" to take. No allergic reaction.   Current Outpatient Medications on File Prior to Visit  Medication Sig Dispense Refill   acetaminophen (TYLENOL) 500 MG tablet Take 2 tablets (1,000 mg total) by mouth 2 (two) times daily. 60 tablet 11   atorvastatin (LIPITOR) 20 MG tablet TAKE 1 TABLET BY MOUTH EVERY DAY 90 tablet 3   metoprolol succinate (TOPROL-XL) 50 MG 24 hr tablet Take 1 and 1/2 tablets (75 mg) by mouth daily 135 tablet 3   metoprolol tartrate (LOPRESSOR) 25 MG tablet TAKE 1 TABLET (25 MG TOTAL) BY MOUTH DAILY AS NEEDED (PALPITATIONS). 90 tablet 3   Multiple Vitamin (MULTIVITAMIN WITH MINERALS) TABS tablet Take 1 tablet by mouth daily. Centrum Silver     nitroGLYCERIN (NITROSTAT) 0.4 MG SL tablet Place 1 tablet (0.4 mg total) under the tongue every 5 (five) minutes as needed for chest pain (maximum of 3 tablets). 25 tablet 4   No current facility-administered medications on file prior to visit.        ROS:  All others reviewed and negative.  Objective        PE:  BP 120/78 (BP Location: Right Arm, Patient Position: Sitting, Cuff Size: Large)   Pulse 90   Temp 98.2 F (36.8 C) (Oral)   Ht 5\' 4"  (1.626 m)   Wt 103 lb (46.7 kg)   SpO2 97%   BMI 17.68 kg/m                 Constitutional: Pt appears in NAD               HENT: Head: NCAT.                Right Ear: External ear normal.                 Left Ear: External ear normal.                Eyes: . Pupils are equal, round, and reactive to light. Conjunctivae and EOM are normal               Nose: without d/c or deformity               Neck: Neck supple. Gross normal ROM               Cardiovascular: Normal rate and regular rhythm.  Pulmonary/Chest: Effort normal and breath sounds without rales or wheezing.                 Abd:  Soft, NT, ND, + BS, no organomegaly               Neurological: Pt is alert. At baseline orientation, motor grossly intact               Skin: Skin is warm. No rashes, no other new lesions, LE edema - none               Psychiatric: Pt behavior is normal without agitation . sad  Micro: none  Cardiac tracings I have personally interpreted today:  none  Pertinent Radiological findings (summarize): none   Lab Results  Component Value Date   WBC 5.0 04/11/2022   HGB 12.9 04/11/2022   HCT 38.4 04/11/2022   PLT 226.0 04/11/2022   GLUCOSE 85 04/11/2022   CHOL 182 04/11/2022   TRIG 112.0 04/11/2022   HDL 82.60 04/11/2022   LDLDIRECT 199.0 09/17/2020   LDLCALC 77 04/11/2022   ALT 15 04/11/2022   AST 21 04/11/2022   NA 137 04/11/2022   K 4.8 04/11/2022   CL 100 04/11/2022   CREATININE 1.13 04/11/2022   BUN 16 04/11/2022   CO2 28 04/11/2022   TSH 0.85 04/11/2022   INR 1.06 01/20/2016   HGBA1C 6.0 04/11/2022   Assessment/Plan:  Lindsay Doyle is a 89 y.o. Black or African American [2] female with  has a past medical history of ANEMIA-IRON DEFICIENCY, Atrial fibrillation (HCC), Blood transfusion, CAD (coronary artery disease), Cataract, COLONIC POLYPS, HX OF (03/09/2007), COPD (03/09/2007), GERD (gastroesophageal reflux disease), GI bleed, echocardiogram, HYPERGLYCEMIA (07/02/2007), Hyperlipidemia, Hypertension (11/12/2006), LEUKOPENIA, MILD (05/30/2008), Mitral valve prolapse, NSTEMI (non-ST elevated myocardial infarction) (HCC) (03/2012), OSTEOARTHRITIS (02/19/2009), OSTEOPOROSIS (11/12/2006), Positive H. pylori test (10/1996), PSVT, Spinal stenosis of lumbar region (07/16/2012), and Transfusion history.  Arthritis Bialteral hands - for otc vol gel prn  Essential hypertension BP Readings from Last 3 Encounters:  10/10/22 120/78  09/01/22 (!) 160/100  04/11/22 122/66   Stable, pt to continue medical treatment toprol xl 75 every day,    Grief Mild to mod, declines need for  counseling referral,  to f/u any worsening symptoms or concerns  OAB (overactive bladder) Mild to mod, declines tx such as vesicare today, to f/u any worsening symptoms or concerns  Followup: Return in about 6 months (around 04/12/2023).  Oliver Barre, MD 10/12/2022 8:54 PM [facility] [place] - [facility]

## 2022-10-12 ENCOUNTER — Encounter: Payer: Self-pay | Admitting: Internal Medicine

## 2022-10-12 DIAGNOSIS — N3281 Overactive bladder: Secondary | ICD-10-CM | POA: Insufficient documentation

## 2022-10-12 NOTE — Assessment & Plan Note (Signed)
BP Readings from Last 3 Encounters:  10/10/22 120/78  09/01/22 (!) 160/100  04/11/22 122/66   Stable, pt to continue medical treatment toprol xl 75 every day,

## 2022-10-12 NOTE — Assessment & Plan Note (Signed)
Mild to mod, declines tx such as vesicare today, to f/u any worsening symptoms or concerns

## 2022-10-12 NOTE — Assessment & Plan Note (Signed)
Mild to mod, declines need for counseling referral,  to f/u any worsening symptoms or concerns

## 2022-10-12 NOTE — Assessment & Plan Note (Signed)
Bialteral hands - for otc vol gel prn

## 2022-10-31 ENCOUNTER — Other Ambulatory Visit: Payer: Self-pay | Admitting: Internal Medicine

## 2022-11-12 ENCOUNTER — Other Ambulatory Visit: Payer: Self-pay | Admitting: Cardiovascular Disease

## 2022-12-03 DIAGNOSIS — Z23 Encounter for immunization: Secondary | ICD-10-CM | POA: Diagnosis not present

## 2022-12-10 ENCOUNTER — Ambulatory Visit: Payer: Medicare Other | Admitting: Internal Medicine

## 2022-12-16 ENCOUNTER — Ambulatory Visit (INDEPENDENT_AMBULATORY_CARE_PROVIDER_SITE_OTHER): Payer: Medicare Other | Admitting: Internal Medicine

## 2022-12-16 ENCOUNTER — Encounter: Payer: Self-pay | Admitting: Internal Medicine

## 2022-12-16 VITALS — BP 128/80 | HR 87 | Temp 97.7°F | Ht 64.0 in | Wt 104.0 lb

## 2022-12-16 DIAGNOSIS — E782 Mixed hyperlipidemia: Secondary | ICD-10-CM | POA: Diagnosis not present

## 2022-12-16 DIAGNOSIS — E559 Vitamin D deficiency, unspecified: Secondary | ICD-10-CM

## 2022-12-16 DIAGNOSIS — R739 Hyperglycemia, unspecified: Secondary | ICD-10-CM | POA: Diagnosis not present

## 2022-12-16 DIAGNOSIS — M19049 Primary osteoarthritis, unspecified hand: Secondary | ICD-10-CM | POA: Insufficient documentation

## 2022-12-16 DIAGNOSIS — I10 Essential (primary) hypertension: Secondary | ICD-10-CM

## 2022-12-16 DIAGNOSIS — L609 Nail disorder, unspecified: Secondary | ICD-10-CM | POA: Diagnosis not present

## 2022-12-16 NOTE — Patient Instructions (Addendum)
Ok to try the OTC Keratin for the nails  Ok to try the OTC Voltaren gel as needed for hand arthritis  Please continue all other medications as before, and refills have been done if requested.  Please have the pharmacy call with any other refills you may need.  Please continue your efforts at being more active, low cholesterol diet, and weight control  Please keep your appointments with your specialists as you may have planned  Please make an Appointment to return in Jan 13, or sooner if needed

## 2022-12-16 NOTE — Assessment & Plan Note (Signed)
Last vitamin D Lab Results  Component Value Date   VD25OH 36.70 04/11/2022   Low, to start oral replacement

## 2022-12-16 NOTE — Assessment & Plan Note (Signed)
Lab Results  Component Value Date   LDLCALC 77 04/11/2022   Uncontrolled, goal ldl < 70, pt to continue current statin lipitor 20 mg - declines change for now, for lower chol diet as well

## 2022-12-16 NOTE — Assessment & Plan Note (Signed)
Ok for keratin otc prn

## 2022-12-16 NOTE — Assessment & Plan Note (Signed)
BP Readings from Last 3 Encounters:  12/16/22 128/80  10/10/22 120/78  09/01/22 (!) 160/100   Stable, pt to continue medical treatment toprol xl 75 qd

## 2022-12-16 NOTE — Assessment & Plan Note (Signed)
Mild to mod, for otc volt gel prn,  to f/u any worsening symptoms or concerns

## 2022-12-16 NOTE — Progress Notes (Signed)
Patient ID: Lindsay Doyle, female   DOB: [birth year], 89 y.o.   MRN: [id number]        Chief Complaint: follow up nail disorder, hand arthritis, low vit d, htn, hld      HPI:  Lindsay Doyle is a 89 y.o. female here with mention of several nails in the past 3 wks with ridging and small end cracks that snag on her clothes.  Also has some increase hand OA pain dull intermittent with use, without swelling.  Pt denies chest pain, increased sob or doe, wheezing, orthopnea, PND, increased LE swelling, palpitations, dizziness or syncope.   Pt denies polydipsia, polyuria, or new focal neuro s/s.    Pt denies fever, wt loss, night sweats, loss of appetite, or other constitutional symptoms         Wt Readings from Last 3 Encounters:  12/16/22 104 lb (47.2 kg)  10/10/22 103 lb (46.7 kg)  09/01/22 102 lb 9.6 oz (46.5 kg)   BP Readings from Last 3 Encounters:  12/16/22 128/80  10/10/22 120/78  09/01/22 (!) 160/100         Past Medical History:  Diagnosis Date   ANEMIA-IRON DEFICIENCY    presumed GI bleed 08/2012- anticoag stopped   Atrial fibrillation (HCC)    a. Apixaban started 03/2012 - stopped after admx for GI bleed 6/14   Blood transfusion    a. 05/2010: 3 transfusions for anemia/GIB.   CAD (coronary artery disease)    a. LHC 1/14:  mLAD serial 30 and 60%, oRCA 30%, EF 60%, Afib,    Cataract    COLONIC POLYPS, HX OF 03/09/2007   COPD 03/09/2007   ? pt is unsure   GERD (gastroesophageal reflux disease)    GI bleed    thought to be due to AVMs   Hx of echocardiogram    a. echo 2/11:  EF 55-60%, mild AI, mild RAE, mild to mod TR;   b.  a. Echo 2/14:  Mild focal basal and mild LVH, EF 60-65%, mild AI, mild RAE, trivia effusion   HYPERGLYCEMIA 07/02/2007   Hyperlipidemia    Hypertension 11/12/2006   LEUKOPENIA, MILD 05/30/2008   Mitral valve prolapse    Remotely - last echo 2011 did not show this   NSTEMI (non-ST elevated myocardial infarction) (HCC) 03/2012   a. Type II in setting of AF with  RVR 03/2012   OSTEOARTHRITIS 02/19/2009   Spinal   OSTEOPOROSIS 11/12/2006   Positive H. pylori test 10/1996   PSVT    a. Remotely, in setting of anemia.   Spinal stenosis of lumbar region 07/16/2012   Transfusion history    7/15    Past Surgical History:  Procedure Laterality Date   ABDOMINAL HYSTERECTOMY     CARDIAC CATHETERIZATION  1992   S/P in 1992 at [facility] in [place]. This was negative for any coronary artery disease   carotid duplex  08/29/2003   CATARACT EXTRACTION     ceasarean     ENTEROSCOPY N/A 10/10/2013   Procedure: ENTEROSCOPY;  Surgeon: Louis Meckel, MD;  Location: [facility];  Service: Endoscopy;  Laterality: N/A;   ENTEROSCOPY N/A 12/28/2014   Procedure: ENTEROSCOPY;  Surgeon: Louis Meckel, MD;  Location: [facility];  Service: Endoscopy;  Laterality: N/A;   ENTEROSCOPY N/A 01/20/2016   Procedure: ENTEROSCOPY;  Surgeon: Ruffin Frederick, MD;  Location: [facility] ENDOSCOPY;  Service: Gastroenterology;  Laterality: N/A;   HOT HEMOSTASIS N/A 12/28/2014  Procedure: HOT HEMOSTASIS (ARGON PLASMA COAGULATION/BICAP);  Surgeon: Louis Meckel, MD;  Location: [facility] ENDOSCOPY;  Service: Endoscopy;  Laterality: N/A;   LEFT HEART CATHETERIZATION WITH CORONARY ANGIOGRAM N/A 04/23/2012   Procedure: LEFT HEART CATHETERIZATION WITH CORONARY ANGIOGRAM;  Surgeon: Laurey Morale, MD;  Location: [facility] CATH LAB;  Service: Cardiovascular;  Laterality: N/A;   TONSILLECTOMY     TUBAL LIGATION      reports that she has never smoked. She has never used smokeless tobacco. She reports that she does not drink alcohol and does not use drugs. family history includes Colon cancer in her brother; Prostate cancer in her brother; Sarcoidosis in her sister and another family member; Stomach cancer in her mother. Allergies  Allergen Reactions   Doxycycline Diarrhea    Possible diarrhea   Clarithromycin Other (See Comments)    diarrhea   Metronidazole Other (See Comments)     Pt had difficulty swallowing this and says it was "horrible" to take. No allergic reaction.   Current Outpatient Medications on File Prior to Visit  Medication Sig Dispense Refill   acetaminophen (TYLENOL) 500 MG tablet Take 2 tablets (1,000 mg total) by mouth 2 (two) times daily. 60 tablet 11   atorvastatin (LIPITOR) 20 MG tablet TAKE 1 TABLET BY MOUTH EVERY DAY 90 tablet 3   metoprolol succinate (TOPROL-XL) 50 MG 24 hr tablet Take 1 and 1/2 tablets (75 mg) by mouth daily 135 tablet 3   metoprolol tartrate (LOPRESSOR) 25 MG tablet TAKE 1 TABLET (25 MG TOTAL) BY MOUTH DAILY AS NEEDED (PALPITATIONS). 90 tablet 3   Multiple Vitamin (MULTIVITAMIN WITH MINERALS) TABS tablet Take 1 tablet by mouth daily. Centrum Silver     nitroGLYCERIN (NITROSTAT) 0.4 MG SL tablet Place 1 tablet (0.4 mg total) under the tongue every 5 (five) minutes as needed for chest pain (maximum of 3 tablets). 25 tablet 4   No current facility-administered medications on file prior to visit.        ROS:  All others reviewed and negative.  Objective        PE:  BP 128/80 (BP Location: Left Arm, Patient Position: Sitting, Cuff Size: Normal)   Pulse 87   Temp 97.7 F (36.5 C) (Oral)   Ht 5\' 4"  (1.626 m)   Wt 104 lb (47.2 kg)   SpO2 98%   BMI 17.85 kg/m                 Constitutional: Pt appears in NAD               HENT: Head: NCAT.                Right Ear: External ear normal.                 Left Ear: External ear normal.                Eyes: . Pupils are equal, round, and reactive to light. Conjunctivae and EOM are normal               Nose: without d/c or deformity               Neck: Neck supple. Gross normal ROM               Cardiovascular: Normal rate and regular rhythm.                 Pulmonary/Chest: Effort normal and breath sounds without rales or wheezing.  Abd:  Soft, NT, ND, + BS, no organomegaly               Neurological: Pt is alert. At baseline orientation, motor grossly  intact               Skin: Skin is warm. No rashes, no other new lesions, LE edema - none, several fingernails with ridging and small end cracking               Psychiatric: Pt behavior is normal without agitation   Micro: none  Cardiac tracings I have personally interpreted today:  none  Pertinent Radiological findings (summarize): none   Lab Results  Component Value Date   WBC 5.0 04/11/2022   HGB 12.9 04/11/2022   HCT 38.4 04/11/2022   PLT 226.0 04/11/2022   GLUCOSE 85 04/11/2022   CHOL 182 04/11/2022   TRIG 112.0 04/11/2022   HDL 82.60 04/11/2022   LDLDIRECT 199.0 09/17/2020   LDLCALC 77 04/11/2022   ALT 15 04/11/2022   AST 21 04/11/2022   NA 137 04/11/2022   K 4.8 04/11/2022   CL 100 04/11/2022   CREATININE 1.13 04/11/2022   BUN 16 04/11/2022   CO2 28 04/11/2022   TSH 0.85 04/11/2022   INR 1.06 01/20/2016   HGBA1C 6.0 04/11/2022   Assessment/Plan:  Lindsay Doyle is a 89 y.o. Black or African American [2] female with  has a past medical history of ANEMIA-IRON DEFICIENCY, Atrial fibrillation (HCC), Blood transfusion, CAD (coronary artery disease), Cataract, COLONIC POLYPS, HX OF (03/09/2007), COPD (03/09/2007), GERD (gastroesophageal reflux disease), GI bleed, echocardiogram, HYPERGLYCEMIA (07/02/2007), Hyperlipidemia, Hypertension (11/12/2006), LEUKOPENIA, MILD (05/30/2008), Mitral valve prolapse, NSTEMI (non-ST elevated myocardial infarction) (HCC) (03/2012), OSTEOARTHRITIS (02/19/2009), OSTEOPOROSIS (11/12/2006), Positive H. pylori test (10/1996), PSVT, Spinal stenosis of lumbar region (07/16/2012), and Transfusion history.  Essential hypertension BP Readings from Last 3 Encounters:  12/16/22 128/80  10/10/22 120/78  09/01/22 (!) 160/100   Stable, pt to continue medical treatment toprol xl 75 qd   Hand arthritis Mild to mod, for otc volt gel prn, to f/u any worsening symptoms or concerns   Hyperglycemia Lab Results  Component Value Date   HGBA1C 6.0 04/11/2022    Stable, pt to continue current medical treatment  - diet, wt control   Hyperlipidemia Lab Results  Component Value Date   LDLCALC 77 04/11/2022   Uncontrolled, goal ldl < 70, pt to continue current statin lipitor 20 mg - declines change for now, for lower chol diet as well   Nail disorder Ok for keratin otc prn  Vitamin D deficiency Last vitamin D Lab Results  Component Value Date   VD25OH 36.70 04/11/2022   Low, to start oral replacement  Followup: Return in about 4 months (around 04/13/2023), or if symptoms worsen or fail to improve.  Oliver Barre, MD 12/16/2022 9:29 AM [facility] [place] [facility] - [facility]

## 2022-12-16 NOTE — Assessment & Plan Note (Signed)
Lab Results  Component Value Date   HGBA1C 6.0 04/11/2022   Stable, pt to continue current medical treatment  - diet, wt control

## 2022-12-22 ENCOUNTER — Other Ambulatory Visit: Payer: Self-pay | Admitting: Cardiovascular Disease

## 2022-12-23 ENCOUNTER — Encounter: Payer: Self-pay | Admitting: Cardiovascular Disease

## 2022-12-24 ENCOUNTER — Telehealth: Payer: Self-pay | Admitting: Cardiovascular Disease

## 2022-12-24 MED ORDER — NITROGLYCERIN 0.4 MG SL SUBL: 0.4000 mg | SUBLINGUAL_TABLET | SUBLINGUAL | 2 refills | Status: DC | PRN

## 2022-12-24 NOTE — Telephone Encounter (Signed)
*  STAT* If patient is at the pharmacy, call can be transferred to refill team.   1. Which medications need to be refilled? (please list name of each medication and dose if known)   nitroGLYCERIN (NITROSTAT) 0.4 MG SL tablet    2. Which pharmacy/location (including street and city if local pharmacy) is medication to be sent to? [id number] - [place], [place] - [address]    3. Do they need a 30 day or 90 day supply? 90 day

## 2022-12-24 NOTE — Telephone Encounter (Signed)
Pt's medication was sent to pt's pharmacy as requested. Confirmation received.  °

## 2023-01-11 ENCOUNTER — Other Ambulatory Visit: Payer: Self-pay | Admitting: Cardiovascular Disease

## 2023-02-02 ENCOUNTER — Other Ambulatory Visit: Payer: Self-pay | Admitting: Cardiovascular Disease

## 2023-02-11 ENCOUNTER — Telehealth: Payer: Self-pay | Admitting: Cardiovascular Disease

## 2023-02-11 NOTE — Telephone Encounter (Signed)
Pt's medication was already sent to pt's pharmacy as requested. Confirmation received.  

## 2023-02-11 NOTE — Telephone Encounter (Signed)
*  STAT* If patient is at the pharmacy, call can be transferred to refill team.   1. Which medications need to be refilled? (please list name of each medication and dose if known) metoprolol tartrate (LOPRESSOR) 25 MG tablet    2. Would you like to learn more about the convenience, safety, & potential cost savings by using the [facility]? No   3. Are you open to using the [facility] (Type [facility]. ) No   4. Which pharmacy/location (including street and city if local pharmacy) is medication to be sent to? [id number] - [place], [place] - [address]    5. Do they need a 30 day or 90 day supply? 90 day

## 2023-03-06 ENCOUNTER — Ambulatory Visit: Payer: Medicare Other | Admitting: Cardiovascular Disease

## 2023-04-08 ENCOUNTER — Encounter: Payer: Self-pay | Admitting: Cardiovascular Disease

## 2023-04-08 ENCOUNTER — Ambulatory Visit: Payer: Medicare Other | Attending: Cardiovascular Disease | Admitting: Cardiovascular Disease

## 2023-04-08 VITALS — BP 110/80 | HR 78 | Ht 64.0 in | Wt 101.8 lb

## 2023-04-08 DIAGNOSIS — I480 Paroxysmal atrial fibrillation: Secondary | ICD-10-CM | POA: Insufficient documentation

## 2023-04-08 DIAGNOSIS — I10 Essential (primary) hypertension: Secondary | ICD-10-CM | POA: Diagnosis not present

## 2023-04-08 DIAGNOSIS — E782 Mixed hyperlipidemia: Secondary | ICD-10-CM | POA: Insufficient documentation

## 2023-04-08 NOTE — Progress Notes (Signed)
 Cardiology Office Note:    Date:  04/08/2023   ID:  Loyda B. Jazalyn, Mondor 04/28/1933, MRN [id number]  PCP:  Norleen Lynwood ORN, MD   [facility] Providers Cardiologist:  Ozell Fell, MD Electrophysiologist:  OLE ONEIDA HOLTS, MD     Referring MD: Norleen Lynwood ORN, MD   Chief Complaint  Patient presents with   Follow-up    Palpitations    History of Present Illness:    Lindsay Doyle is a 89 y.o. female presenting for follow-up of heart palpitations, hyperlipidemia, and hypertension.  She has remote history of atrial fibrillation but has not been on anticoagulation.  She refused anticoagulation many years ago and actually has not had any atrial fibrillation documented in a long time.  She has no history of stroke or TIA.  She has had no recent problems with palpitations.  She denies chest pain, chest pressure, or shortness of breath.  She has been lonely since her husband passed away about 18 months ago.  She stays pretty active in her community.   Current Medications: Current Meds  Medication Sig   acetaminophen  (TYLENOL ) 500 MG tablet Take 2 tablets (1,000 mg total) by mouth 2 (two) times daily.   atorvastatin  (LIPITOR) 20 MG tablet TAKE 1 TABLET BY MOUTH EVERY DAY   metoprolol  succinate (TOPROL -XL) 50 MG 24 hr tablet TAKE 1 AND 1/2 TABLETS (75 MG) BY MOUTH EVERY DAY   metoprolol  tartrate (LOPRESSOR ) 25 MG tablet TAKE 1 TABLET (25 MG TOTAL) BY MOUTH DAILY AS NEEDED (PALPITATIONS).   Multiple Vitamin (MULTIVITAMIN WITH MINERALS) TABS tablet Take 1 tablet by mouth daily. Centrum Silver   nitroGLYCERIN  (NITROSTAT ) 0.4 MG SL tablet Place 1 tablet (0.4 mg total) under the tongue every 5 (five) minutes as needed for chest pain.     Allergies:   Doxycycline, Clarithromycin , and Metronidazole    ROS:   Please see the history of present illness.    All other systems reviewed and are negative.  EKGs/Labs/Other Studies Reviewed:    The following studies were reviewed  today: Cardiac Studies & Procedures      ECHOCARDIOGRAM  ECHOCARDIOGRAM COMPLETE 12/10/2019  Narrative ECHOCARDIOGRAM REPORT    Patient Name:   [name] Date of Exam: 12/10/2019 Medical Rec #:  [id number]        Height:       64.0 in Accession #:    [id number]       Weight:       113.1 lb Date of Birth:  [birth year]         BSA:          1.535 m Patient Age:    86 years         BP:           165/75 mmHg Patient Gender: F                HR:           96 bpm. Exam Location:  Inpatient  Procedure: 2D Echo  Indications:    Abnormal EKG  History:        Patient has prior history of Echocardiogram examinations, most recent 01/20/2016. Paroxysmal SVT, CAD, chronic kidney disease 3A, hypertension, Hyperlipidemia, GERD.  Sonographer:    Annabella Fell RDCS Referring Phys: [id number] MAURICIO DANIEL ARRIEN  IMPRESSIONS   1. Left ventricular ejection fraction, by estimation, is 60 to 65%. The left ventricle has normal function. The left ventricle has no regional wall motion abnormalities.  There is moderate left ventricular hypertrophy of the basal-septal segment. Left ventricular diastolic parameters are consistent with Grade I diastolic dysfunction (impaired relaxation). Elevated left ventricular end-diastolic pressure. 2. Right ventricular systolic function is mildly reduced. The right ventricular size is normal. There is normal pulmonary artery systolic pressure. The estimated right ventricular systolic pressure is 20.5 mmHg. 3. The mitral valve is normal in structure. No evidence of mitral valve regurgitation. No evidence of mitral stenosis. 4. The aortic valve is tricuspid. There is moderate calcification of the aortic valve. There is moderate thickening of the aortic valve. Aortic valve regurgitation is mild. Mild aortic valve stenosis. Aortic regurgitation PHT measures 511 msec. Aortic valve area, by VTI measures 1.70 cm. Aortic valve mean gradient measures 28.0 mmHg. Aortic valve  Vmax measures 3.15 m/s. 5. The inferior vena cava is normal in size with greater than 50% respiratory variability, suggesting right atrial pressure of 3 mmHg.  FINDINGS Left Ventricle: Left ventricular ejection fraction, by estimation, is 60 to 65%. The left ventricle has normal function. The left ventricle has no regional wall motion abnormalities. The left ventricular internal cavity size was normal in size. There is moderate left ventricular hypertrophy of the basal-septal segment. Left ventricular diastolic parameters are consistent with Grade I diastolic dysfunction (impaired relaxation). Elevated left ventricular end-diastolic pressure.  Right Ventricle: The right ventricular size is normal. No increase in right ventricular wall thickness. Right ventricular systolic function is mildly reduced. There is normal pulmonary artery systolic pressure. The tricuspid regurgitant velocity is 2.09 m/s, and with an assumed right atrial pressure of 3 mmHg, the estimated right ventricular systolic pressure is 20.5 mmHg.  Left Atrium: Left atrial size was normal in size.  Right Atrium: Right atrial size was normal in size.  Pericardium: There is no evidence of pericardial effusion.  Mitral Valve: The mitral valve is normal in structure. Mild to moderate mitral annular calcification. No evidence of mitral valve regurgitation. No evidence of mitral valve stenosis.  Tricuspid Valve: The tricuspid valve is normal in structure. Tricuspid valve regurgitation is mild . No evidence of tricuspid stenosis.  Aortic Valve: The aortic valve is tricuspid. There is moderate calcification of the aortic valve. There is moderate thickening of the aortic valve. Aortic valve regurgitation is mild. Aortic regurgitation PHT measures 511 msec. Mild aortic stenosis is present. Aortic valve mean gradient measures 28.0 mmHg. Aortic valve peak gradient measures 39.7 mmHg. Aortic valve area, by VTI measures 1.70 cm.  Pulmonic  Valve: The pulmonic valve was normal in structure. Pulmonic valve regurgitation is not visualized. No evidence of pulmonic stenosis.  Aorta: The aortic root is normal in size and structure.  Venous: The inferior vena cava is normal in size with greater than 50% respiratory variability, suggesting right atrial pressure of 3 mmHg.  IAS/Shunts: No atrial level shunt detected by color flow Doppler.   LEFT VENTRICLE PLAX 2D LVIDd:         2.90 cm  Diastology LVIDs:         1.70 cm  LV e' medial:    2.76 cm/s LV PW:         1.10 cm  LV E/e' medial:  23.8 LV IVS:        1.30 cm  LV e' lateral:   2.55 cm/s LVOT diam:     1.54 cm  LV E/e' lateral: 25.7 LV SV:         26 LV SV Index:   17 LVOT Area:  1.86 cm   RIGHT VENTRICLE RV S prime:     7.43 cm/s TAPSE (M-mode): 1.4 cm  LEFT ATRIUM             Index       RIGHT ATRIUM           Index LA diam:        2.80 cm 1.82 cm/m  RA Area:     12.50 cm LA Vol (A2C):   22.5 ml 14.65 ml/m RA Volume:   26.60 ml  17.32 ml/m LA Vol (A4C):   27.8 ml 18.11 ml/m LA Biplane Vol: 25.3 ml 16.48 ml/m AORTIC VALVE AV Area (Vmax):    0.48 cm AV Area (VTI):     1.70 cm AV Vmax:           315.00 cm/s AV VTI:            0.150 m AV Peak Grad:      39.7 mmHg AV Mean Grad:      28.0 mmHg LVOT Vmax:         82.00 cm/s LVOT Vmean:        50.300 cm/s LVOT VTI:          0.137 m LVOT/AV VTI ratio: 0.91 AI PHT:            511 msec  AORTA Ao Root diam: 3.20 cm  MITRAL VALVE                TRICUSPID VALVE MV Area (PHT): 2.32 cm     TR Peak grad:   17.5 mmHg MV Decel Time: 327 msec     TR Vmax:        209.00 cm/s MV E velocity: 65.60 cm/s MV A velocity: 100.00 cm/s  SHUNTS MV E/A ratio:  0.66         Systemic VTI:  0.14 m Systemic Diam: 1.54 cm  Wilbert Bihari MD Electronically signed by Wilbert Bihari MD Signature Date/Time: 12/10/2019/3:56:15 PM    Final   MONITORS  LONG TERM MONITOR (3-14 DAYS) 12/19/2019  Narrative HR 57-197, average  77. 5 episodes of SVT, longest lasting 6 beats with a rate of 197bpm. Rare ventricular ectopy.  Ole T. Cindie, MD, [facility] Cardiac Electrophysiology           EKG:        Recent Labs: 04/11/2022: ALT 15; BUN 16; Creatinine, Ser 1.13; Hemoglobin 12.9; Platelets 226.0; Potassium 4.8; Sodium 137; TSH 0.85  Recent Lipid Panel    Component Value Date/Time   CHOL 182 04/11/2022 1145   TRIG 112.0 04/11/2022 1145   HDL 82.60 04/11/2022 1145   CHOLHDL 2 04/11/2022 1145   VLDL 22.4 04/11/2022 1145   LDLCALC 77 04/11/2022 1145   LDLDIRECT 199.0 09/17/2020 1511     Risk Assessment/Calculations:                Physical Exam:    VS:  BP 110/80   Pulse 78   Ht 5' 4 (1.626 m)   Wt 101 lb 12.8 oz (46.2 kg)   SpO2 95%   BMI 17.47 kg/m     Wt Readings from Last 3 Encounters:  04/08/23 101 lb 12.8 oz (46.2 kg)  12/16/22 104 lb (47.2 kg)  10/10/22 103 lb (46.7 kg)     GEN:  Well nourished, well developed in no acute distress HEENT: Normal NECK: No JVD; No carotid bruits LYMPHATICS: No lymphadenopathy CARDIAC: RRR, no murmurs, rubs, gallops RESPIRATORY:  Clear to auscultation without rales, wheezing or rhonchi  ABDOMEN: Soft, non-tender, non-distended MUSCULOSKELETAL:  No edema; No deformity  SKIN: Warm and dry NEUROLOGIC:  Alert and oriented x 3 PSYCHIATRIC:  Normal affect   Assessment & Plan Paroxysmal atrial fibrillation (HCC) No atrial fibrillation documented in many years.  The patient has a remote history of bleeding and has not been anticoagulated over time.  Continue current treatment. Essential hypertension Blood pressure well-controlled on metoprolol  Mixed hyperlipidemia She has not been taking her atorvastatin  regularly.  Her lipids have been under pretty good control in the past with atorvastatin , last labs showing a cholesterol of 182, LDL 77, HDL 82, and triglycerides 887.  I went back and reviewed her labs before she was taking a statin drug.  Her  cholesterol back then was a size 342 with an LDL cholesterol in excess of 200.  Advised her that she should stay on a statin drug with such marked hyperlipidemia and she is willing to do this.  She was not really experiencing any side effects.  She will start back on this immediately.            Medication Adjustments/Labs and Tests Ordered: Current medicines are reviewed at length with the patient today.  Concerns regarding medicines are outlined above.  No orders of the defined types were placed in this encounter.  No orders of the defined types were placed in this encounter.   There are no Patient Instructions on file for this visit.   Signed, Ozell Fell, MD  04/08/2023 3:31 PM    [facility]

## 2023-04-08 NOTE — Patient Instructions (Signed)
 Follow-Up: At [facility], you and your health needs are our priority.  As part of our continuing mission to provide you with exceptional heart care, we have created designated Provider Care Teams.  These Care Teams include your primary Cardiologist (physician) and Advanced Practice Providers (APPs -  Physician Assistants and Nurse Practitioners) who all work together to provide you with the care you need, when you need it.  Your next appointment:   1 year(s)  Provider:   Tonny Bollman, MD

## 2023-04-08 NOTE — Assessment & Plan Note (Signed)
 Blood pressure well controlled on metoprolol

## 2023-04-08 NOTE — Assessment & Plan Note (Signed)
 No atrial fibrillation documented in many years.  The patient has a remote history of bleeding and has not been anticoagulated over time.  Continue current treatment.

## 2023-04-08 NOTE — Assessment & Plan Note (Signed)
 She has not been taking her atorvastatin  regularly.  Her lipids have been under pretty good control in the past with atorvastatin , last labs showing a cholesterol of 182, LDL 77, HDL 82, and triglycerides 887.  I went back and reviewed her labs before she was taking a statin drug.  Her cholesterol back then was a size 342 with an LDL cholesterol in excess of 200.  Advised her that she should stay on a statin drug with such marked hyperlipidemia and she is willing to do this.  She was not really experiencing any side effects.  She will start back on this immediately.

## 2023-04-09 ENCOUNTER — Telehealth: Payer: Self-pay | Admitting: Cardiovascular Disease

## 2023-04-09 NOTE — Telephone Encounter (Signed)
Tried to call patient, no answer. Will try again later

## 2023-04-09 NOTE — Telephone Encounter (Signed)
 Patient states she needs a copy of her appt summary from yesterday 04/08/23. Please advise

## 2023-04-13 ENCOUNTER — Ambulatory Visit: Payer: Medicare Other | Admitting: Internal Medicine

## 2023-04-16 NOTE — Telephone Encounter (Signed)
04/08/2023 after visit summary placed in outgoing mail to patient

## 2023-04-22 ENCOUNTER — Ambulatory Visit: Payer: Medicare Other | Admitting: Internal Medicine

## 2023-04-28 ENCOUNTER — Telehealth: Payer: Self-pay | Admitting: Cardiovascular Disease

## 2023-04-28 ENCOUNTER — Encounter (HOSPITAL_COMMUNITY): Payer: Self-pay | Admitting: Hematology and Oncology

## 2023-04-28 ENCOUNTER — Telehealth: Payer: Self-pay | Admitting: Pharmacy Technician

## 2023-04-28 ENCOUNTER — Other Ambulatory Visit (HOSPITAL_COMMUNITY): Payer: Self-pay

## 2023-04-28 NOTE — Telephone Encounter (Signed)
Patient is following up. She states her medication was $66 which is too expensive. She states she plans to take it back to the pharmacy for a refund.

## 2023-04-28 NOTE — Telephone Encounter (Signed)
You routed this conversation to Loa Socks, LPN  Lars Mage, RN Me     04/28/23  5:04 PM Note  prescription doesn't need a prior authorization. Patient has [employer], she received a 90 day supply of this. Maybe the 30 days would be cheaper? Also she has a deductible. Maybe a change in medication would be cheaper but most likely they all cost the same in that category.    Per test claim: Refill too soon. PA is not needed at this time. Medication was filled 04/26/23. Next eligible fill date is 07/03/23.           04/28/23  4:31 PM Loa Socks, LPN routed this conversation to Rx Prior Auth Team  Lars Mage, RN     KT   04/28/23  4:30 PM Malachi Carl E routed this conversation to [facility] Triage    Araceli Bouche   04/28/23  4:30 PM Note Patient is following up. She states her medication was $66 which is too expensive. She states she plans to take it back to the pharmacy for a refund.            04/28/23  4:27 PM Grace Bushy, Loleta Books contacted Malachi Carl E         04/28/23  3:37 PM Loa Socks, LPN routed this conversation to Lars Mage, RN     JW   04/28/23  3:34 PM Erskine Squibb B routed this conversation to [facility] Triage    Fletcher Anon   04/28/23  3:34 PM Note Pt c/o medication issue:   1. Name of Medication:    metoprolol succinate (TOPROL-XL) 50 MG 24 hr tablet    2. How are you currently taking this medication (dosage and times per day)?   As prescribed   3. Are you having a reaction (difficulty breathing--STAT)?    4. What is your medication issue?    Patient stated this medication is too expensive for her now and she wants to get alternate medication or next steps.  Patient stated she is almost out of this medication.

## 2023-04-28 NOTE — Telephone Encounter (Signed)
Pt c/o medication issue:  1. Name of Medication:   metoprolol succinate (TOPROL-XL) 50 MG 24 hr tablet   2. How are you currently taking this medication (dosage and times per day)?   As prescribed  3. Are you having a reaction (difficulty breathing--STAT)?   4. What is your medication issue?   Patient stated this medication is too expensive for her now and she wants to get alternate medication or next steps.  Patient stated she is almost out of this medication.

## 2023-04-28 NOTE — Telephone Encounter (Signed)
prescription doesn't need a prior authorization. Patient has [employer], she received a 90 day supply of this. Maybe the 30 days would be cheaper? Also she has a deductible. Maybe a change in medication would be cheaper but most likely they all cost the same in that category.   Per test claim: Refill too soon. PA is not needed at this time. Medication was filled 04/26/23. Next eligible fill date is 07/03/23.

## 2023-04-30 NOTE — Telephone Encounter (Signed)
Left detailed message per DPR informing patient that this is generic and that she has a deductible she has to meet which is probably responsible for the price hike she's seeing. Additionally, she got a 3 months supply, so it's even higher due to that. Asked that she call back if I can help her more,but to reach out to pharmacy or insurance company to see how much her deductible is.

## 2023-05-06 ENCOUNTER — Encounter: Payer: Self-pay | Admitting: Internal Medicine

## 2023-05-06 ENCOUNTER — Other Ambulatory Visit: Payer: Self-pay | Admitting: Internal Medicine

## 2023-05-06 ENCOUNTER — Ambulatory Visit (INDEPENDENT_AMBULATORY_CARE_PROVIDER_SITE_OTHER): Payer: Medicare Other | Admitting: Internal Medicine

## 2023-05-06 VITALS — BP 122/76 | HR 82 | Temp 97.6°F | Ht 64.0 in | Wt 103.0 lb

## 2023-05-06 DIAGNOSIS — I10 Essential (primary) hypertension: Secondary | ICD-10-CM | POA: Diagnosis not present

## 2023-05-06 DIAGNOSIS — E559 Vitamin D deficiency, unspecified: Secondary | ICD-10-CM

## 2023-05-06 DIAGNOSIS — M19049 Primary osteoarthritis, unspecified hand: Secondary | ICD-10-CM | POA: Diagnosis not present

## 2023-05-06 DIAGNOSIS — R739 Hyperglycemia, unspecified: Secondary | ICD-10-CM

## 2023-05-06 DIAGNOSIS — E782 Mixed hyperlipidemia: Secondary | ICD-10-CM | POA: Diagnosis not present

## 2023-05-06 DIAGNOSIS — N1831 Chronic kidney disease, stage 3a: Secondary | ICD-10-CM

## 2023-05-06 DIAGNOSIS — J449 Chronic obstructive pulmonary disease, unspecified: Secondary | ICD-10-CM

## 2023-05-06 DIAGNOSIS — M19041 Primary osteoarthritis, right hand: Secondary | ICD-10-CM

## 2023-05-06 DIAGNOSIS — E538 Deficiency of other specified B group vitamins: Secondary | ICD-10-CM

## 2023-05-06 LAB — CBC WITH DIFFERENTIAL/PLATELET
Basophils Absolute: 0 10*3/uL (ref 0.0–0.1)
Basophils Relative: 0.6 % (ref 0.0–3.0)
Eosinophils Absolute: 0.1 10*3/uL (ref 0.0–0.7)
Eosinophils Relative: 1.2 % (ref 0.0–5.0)
HCT: 41.3 % (ref 36.0–46.0)
Hemoglobin: 13.6 g/dL (ref 12.0–15.0)
Lymphocytes Relative: 44.3 % (ref 12.0–46.0)
Lymphs Abs: 2.9 10*3/uL (ref 0.7–4.0)
MCHC: 33 g/dL (ref 30.0–36.0)
MCV: 104.6 fL — ABNORMAL HIGH (ref 78.0–100.0)
Monocytes Absolute: 0.6 10*3/uL (ref 0.1–1.0)
Monocytes Relative: 8.9 % (ref 3.0–12.0)
Neutro Abs: 2.9 10*3/uL (ref 1.4–7.7)
Neutrophils Relative %: 45 % (ref 43.0–77.0)
Platelets: 226 10*3/uL (ref 150.0–400.0)
RBC: 3.95 Mil/uL (ref 3.87–5.11)
RDW: 13.3 % (ref 11.5–15.5)
WBC: 6.5 10*3/uL (ref 4.0–10.5)

## 2023-05-06 LAB — URINALYSIS, ROUTINE W REFLEX MICROSCOPIC
Bilirubin Urine: NEGATIVE
Ketones, ur: NEGATIVE
Leukocytes,Ua: NEGATIVE
Nitrite: NEGATIVE
Specific Gravity, Urine: <=1.005 — AB (ref 1.000–1.030)
Total Protein, Urine: NEGATIVE
Urine Glucose: NEGATIVE
Urobilinogen, UA: 0.2 (ref 0.0–1.0)
pH: 6.5 (ref 5.0–8.0)

## 2023-05-06 LAB — BASIC METABOLIC PANEL
BUN: 17 mg/dL (ref 6–23)
CO2: 29 meq/L (ref 19–32)
Calcium: 9.8 mg/dL (ref 8.4–10.5)
Chloride: 99 meq/L (ref 96–112)
Creatinine, Ser: 1.08 mg/dL (ref 0.40–1.20)
GFR: 46.08 mL/min — ABNORMAL LOW (ref >60.00)
Glucose, Bld: 93 mg/dL (ref 70–99)
Potassium: 4 meq/L (ref 3.5–5.1)
Sodium: 137 meq/L (ref 135–145)

## 2023-05-06 LAB — HEPATIC FUNCTION PANEL
ALT: 13 U/L (ref 0–35)
AST: 19 U/L (ref 0–37)
Albumin: 4.7 g/dL (ref 3.5–5.2)
Alkaline Phosphatase: 48 U/L (ref 39–117)
Bilirubin, Direct: 0.1 mg/dL (ref 0.0–0.3)
Total Bilirubin: 0.4 mg/dL (ref 0.2–1.2)
Total Protein: 7.9 g/dL (ref 6.0–8.3)

## 2023-05-06 LAB — LIPID PANEL
Cholesterol: 262 mg/dL — ABNORMAL HIGH (ref 0–200)
HDL: 83 mg/dL (ref >39.00)
LDL Cholesterol: 148 mg/dL — ABNORMAL HIGH (ref 0–99)
NonHDL: 179.19
Total CHOL/HDL Ratio: 3
Triglycerides: 154 mg/dL — ABNORMAL HIGH (ref 0.0–149.0)
VLDL: 30.8 mg/dL (ref 0.0–40.0)

## 2023-05-06 LAB — VITAMIN D 25 HYDROXY (VIT D DEFICIENCY, FRACTURES): VITD: 37.56 ng/mL (ref 30.00–100.00)

## 2023-05-06 LAB — VITAMIN B12: Vitamin B-12: 1054 pg/mL — ABNORMAL HIGH (ref 211–911)

## 2023-05-06 LAB — TSH: TSH: 1.16 u[IU]/mL (ref 0.35–5.50)

## 2023-05-06 LAB — HEMOGLOBIN A1C: Hgb A1c MFr Bld: 6.1 % (ref 4.6–6.5)

## 2023-05-06 MED ORDER — ATORVASTATIN CALCIUM 20 MG PO TABS: 20.0000 mg | ORAL_TABLET | Freq: Every day | ORAL | 3 refills | Status: DC

## 2023-05-06 MED ORDER — METOPROLOL SUCCINATE ER 50 MG PO TB24: ORAL_TABLET | ORAL | 3 refills | Status: DC

## 2023-05-06 NOTE — Progress Notes (Signed)
 Patient ID: Lindsay Doyle, female   DOB: [birth year], 89 y.o.   MRN: [id number]         Chief Complaint:: yearly f/u, copd, ckd3a, htn, hyperglycemia, low vit d       HPI:  Lindsay Doyle is a 89 y.o. female overall doing ok,  Pt denies chest pain, increased sob or doe, wheezing, orthopnea, PND, increased LE swelling, palpitations, dizziness or syncope.   Pt denies polydipsia, polyuria, or new focal neuro s/s.    Pt denies fever, wt loss, night sweats, loss of appetite, or other constitutional symptoms  Does have bilat hand arthritis pain worsening in the past 3 mo.   Wt Readings from Last 3 Encounters:  05/06/23 103 lb (46.7 kg)  04/08/23 101 lb 12.8 oz (46.2 kg)  12/16/22 104 lb (47.2 kg)   BP Readings from Last 3 Encounters:  05/06/23 122/76  04/08/23 110/80  12/16/22 128/80   Immunization History  Administered Date(s) Administered   Fluad Quad(high Dose 65+) 12/23/2018, 01/25/2020, 01/19/2021, 12/26/2021   Influenza Split 03/10/2011, 01/08/2012   Influenza Whole 02/07/2008, 02/05/2009, 02/18/2010   Influenza, High Dose Seasonal PF 01/25/2013, 03/06/2015, 01/16/2017, 01/08/2018   Influenza,inj,Quad PF,6+ Mos 01/25/2014, 01/03/2016   Moderna Covid-19 Fall Seasonal Vaccine 2yrs & older 01/06/2022   PFIZER Comirnaty(Gray Top)Covid-19 Tri-Sucrose Vaccine 07/30/2020   PFIZER(Purple Top)SARS-COV-2 Vaccination 05/14/2019, 06/06/2019, 02/11/2020   Pfizer(Comirnaty)Fall Seasonal Vaccine 12 years and older 12/03/2022   Pneumococcal Conjugate-13 02/08/2013   Pneumococcal Polysaccharide-23 12/29/1997, 01/08/2012   Td 07/29/1996, 07/17/2008   Health Maintenance Due  Topic Date Due   Zoster Vaccines- Shingrix (1 of 2) Never done   DTaP/Tdap/Td (3 - Tdap) 07/18/2018   Medicare Annual Wellness (AWV)  01/28/2023      Past Medical History:  Diagnosis Date   ANEMIA-IRON  DEFICIENCY    presumed GI bleed 08/2012- anticoag stopped   Atrial fibrillation (HCC)    a. Apixaban  started 03/2012 -  stopped after admx for GI bleed 6/14   Blood transfusion    a. 05/2010: 3 transfusions for anemia/GIB.   CAD (coronary artery disease)    a. LHC 1/14:  mLAD serial 30 and 60%, oRCA 30%, EF 60%, Afib,    Cataract    COLONIC POLYPS, HX OF 03/09/2007   COPD 03/09/2007   ? pt is unsure   GERD (gastroesophageal reflux disease)    GI bleed    thought to be due to AVMs   Hx of echocardiogram    a. echo 2/11:  EF 55-60%, mild AI, mild RAE, mild to mod TR;   b.  a. Echo 2/14:  Mild focal basal and mild LVH, EF 60-65%, mild AI, mild RAE, trivia effusion   HYPERGLYCEMIA 07/02/2007   Hyperlipidemia    Hypertension 11/12/2006   LEUKOPENIA, MILD 05/30/2008   Mitral valve prolapse    Remotely - last echo 2011 did not show this   NSTEMI (non-ST elevated myocardial infarction) (HCC) 03/2012   a. Type II in setting of AF with RVR 03/2012   OSTEOARTHRITIS 02/19/2009   Spinal   OSTEOPOROSIS 11/12/2006   Positive H. pylori test 10/1996   PSVT    a. Remotely, in setting of anemia.   Spinal stenosis of lumbar region 07/16/2012   Transfusion history    7/15    Past Surgical History:  Procedure Laterality Date   ABDOMINAL HYSTERECTOMY     CARDIAC CATHETERIZATION  1992   S/P in 1992 at [facility] in [place]  [place]. This  was negative for any coronary artery disease   carotid duplex  08/29/2003   CATARACT EXTRACTION     ceasarean     ENTEROSCOPY N/A 10/10/2013   Procedure: ENTEROSCOPY;  Surgeon: Lamar JONETTA Aho, MD;  Location: [facility] ENDOSCOPY;  Service: Endoscopy;  Laterality: N/A;   ENTEROSCOPY N/A 12/28/2014   Procedure: ENTEROSCOPY;  Surgeon: Lamar JONETTA Aho, MD;  Location: [facility];  Service: Endoscopy;  Laterality: N/A;   ENTEROSCOPY N/A 01/20/2016   Procedure: ENTEROSCOPY;  Surgeon: Elspeth Deward Naval, MD;  Location: [facility] ENDOSCOPY;  Service: Gastroenterology;  Laterality: N/A;   HOT HEMOSTASIS N/A 12/28/2014   Procedure: HOT HEMOSTASIS (ARGON PLASMA COAGULATION/BICAP);  Surgeon: Lamar JONETTA Aho, MD;  Location: [facility];  Service: Endoscopy;  Laterality: N/A;   LEFT HEART CATHETERIZATION WITH CORONARY ANGIOGRAM N/A 04/23/2012   Procedure: LEFT HEART CATHETERIZATION WITH CORONARY ANGIOGRAM;  Surgeon: Ezra GORMAN Shuck, MD;  Location: [facility] CATH LAB;  Service: Cardiovascular;  Laterality: N/A;   TONSILLECTOMY     TUBAL LIGATION      reports that she has never smoked. She has never used smokeless tobacco. She reports that she does not drink alcohol and does not use drugs. family history includes Colon cancer in her brother; Prostate cancer in her brother; Sarcoidosis in her sister and another family member; Stomach cancer in her mother. Allergies  Allergen Reactions   Doxycycline Diarrhea    Possible diarrhea   Clarithromycin  Other (See Comments)    diarrhea   Metronidazole  Other (See Comments)    Pt had difficulty swallowing this and says it was horrible to take. No allergic reaction.   Current Outpatient Medications on File Prior to Visit  Medication Sig Dispense Refill   acetaminophen  (TYLENOL ) 500 MG tablet Take 2 tablets (1,000 mg total) by mouth 2 (two) times daily. 60 tablet 11   metoprolol  tartrate (LOPRESSOR ) 25 MG tablet TAKE 1 TABLET (25 MG TOTAL) BY MOUTH DAILY AS NEEDED (PALPITATIONS). 90 tablet 3   Multiple Vitamin (MULTIVITAMIN WITH MINERALS) TABS tablet Take 1 tablet by mouth daily. Centrum Silver     nitroGLYCERIN  (NITROSTAT ) 0.4 MG SL tablet Place 1 tablet (0.4 mg total) under the tongue every 5 (five) minutes as needed for chest pain. 75 tablet 2   No current facility-administered medications on file prior to visit.        ROS:  All others reviewed and negative.  Objective        PE:  BP 122/76 (BP Location: Right Arm, Patient Position: Sitting, Cuff Size: Normal)   Pulse 82   Temp 97.6 F (36.4 C) (Oral)   Ht 5' 4 (1.626 m)   Wt 103 lb (46.7 kg)   SpO2 98%   BMI 17.68 kg/m                 Constitutional: Pt appears in NAD                HENT: Head: NCAT.                Right Ear: External ear normal.                 Left Ear: External ear normal.                Eyes: . Pupils are equal, round, and reactive to light. Conjunctivae and EOM are normal               Nose: without d/c or deformity  Neck: Neck supple. Gross normal ROM               Cardiovascular: Normal rate and regular rhythm.                 Pulmonary/Chest: Effort normal and breath sounds without rales or wheezing.                Abd:  Soft, NT, ND, + BS, no organomegaly               Neurological: Pt is alert. At baseline orientation, motor grossly intact               Skin: Skin is warm. No rashes, no other new lesions, LE edema - none, bilat finger joint arthritis noted mod to severe               Psychiatric: Pt behavior is normal without agitation   Micro: none  Cardiac tracings I have personally interpreted today:  none  Pertinent Radiological findings (summarize): none   Lab Results  Component Value Date   WBC 6.5 05/06/2023   HGB 13.6 05/06/2023   HCT 41.3 05/06/2023   PLT 226.0 05/06/2023   GLUCOSE 93 05/06/2023   CHOL 262 (H) 05/06/2023   TRIG 154.0 (H) 05/06/2023   HDL 83.00 05/06/2023   LDLDIRECT 199.0 09/17/2020   LDLCALC 148 (H) 05/06/2023   ALT 13 05/06/2023   AST 19 05/06/2023   NA 137 05/06/2023   K 4.0 05/06/2023   CL 99 05/06/2023   CREATININE 1.08 05/06/2023   BUN 17 05/06/2023   CO2 29 05/06/2023   TSH 1.16 05/06/2023   INR 1.06 01/20/2016   HGBA1C 6.1 05/06/2023   Assessment/Plan:  Lindsay Doyle is a 89 y.o. Black or African American [2] female with  has a past medical history of ANEMIA-IRON  DEFICIENCY, Atrial fibrillation (HCC), Blood transfusion, CAD (coronary artery disease), Cataract, COLONIC POLYPS, HX OF (03/09/2007), COPD (03/09/2007), GERD (gastroesophageal reflux disease), GI bleed, echocardiogram, HYPERGLYCEMIA (07/02/2007), Hyperlipidemia, Hypertension (11/12/2006), LEUKOPENIA, MILD (05/30/2008),  Mitral valve prolapse, NSTEMI (non-ST elevated myocardial infarction) (HCC) (03/2012), OSTEOARTHRITIS (02/19/2009), OSTEOPOROSIS (11/12/2006), Positive H. pylori test (10/1996), PSVT, Spinal stenosis of lumbar region (07/16/2012), and Transfusion history.  COPD (chronic obstructive pulmonary disease) (HCC) Stable, cont in haler prn  CKD (chronic kidney disease), stage III Lab Results  Component Value Date   CREATININE 1.08 05/06/2023   Stable overall, cont to avoid nephrotoxins  Essential hypertension BP Readings from Last 3 Encounters:  05/06/23 122/76  04/08/23 110/80  12/16/22 128/80   Stable, pt to continue medical treatment lopressor  25 prn, toprol  xl 25 mg - 1.5 qd   Hand arthritis With mild worsening, for volt gel prn  Hyperglycemia Lab Results  Component Value Date   HGBA1C 6.1 05/06/2023   Stable, pt to continue current medical treatment  - diet, wt control   Hyperlipidemia Lab Results  Component Value Date   LDLCALC 148 (H) 05/06/2023   Uncontrolled,, pt to restart lipitor 20 mg qd   Vitamin D  deficiency Last vitamin D  Lab Results  Component Value Date   VD25OH 37.56 05/06/2023   Low, to start oral replacement  Followup: Return in about 6 months (around 11/03/2023).  Lynwood Rush, MD 05/10/2023 2:13 PM [facility] [place] [facility]

## 2023-05-06 NOTE — Patient Instructions (Signed)

## 2023-05-10 ENCOUNTER — Encounter: Payer: Self-pay | Admitting: Internal Medicine

## 2023-05-10 NOTE — Assessment & Plan Note (Signed)
 BP Readings from Last 3 Encounters:  05/06/23 122/76  04/08/23 110/80  12/16/22 128/80   Stable, pt to continue medical treatment lopressor  25 prn, toprol  xl 25 mg - 1.5 qd

## 2023-05-10 NOTE — Assessment & Plan Note (Signed)
 Lab Results  Component Value Date   HGBA1C 6.1 05/06/2023   Stable, pt to continue current medical treatment  - diet, wt control

## 2023-05-10 NOTE — Assessment & Plan Note (Signed)
 With mild worsening, for volt gel prn

## 2023-05-10 NOTE — Assessment & Plan Note (Signed)
 Stable , cont inhaler prn

## 2023-05-10 NOTE — Assessment & Plan Note (Signed)
 Lab Results  Component Value Date   LDLCALC 148 (H) 05/06/2023   Uncontrolled,, pt to restart lipitor 20 mg qd

## 2023-05-10 NOTE — Assessment & Plan Note (Signed)
 Lab Results  Component Value Date   CREATININE 1.08 05/06/2023   Stable overall, cont to avoid nephrotoxins

## 2023-05-10 NOTE — Assessment & Plan Note (Signed)
 Last vitamin D  Lab Results  Component Value Date   VD25OH 37.56 05/06/2023   Low, to start oral replacement

## 2023-07-27 ENCOUNTER — Other Ambulatory Visit: Payer: Self-pay | Admitting: Cardiovascular Disease

## 2023-07-27 ENCOUNTER — Telehealth: Payer: Self-pay | Admitting: Internal Medicine

## 2023-07-27 NOTE — Telephone Encounter (Signed)
 Patient called back. Wanted to know why he medication that should be prescribed by Dr Copper was prescribed by Dr Autry Legions. Told her that someone already left a message for some to return her call.

## 2023-07-27 NOTE — Telephone Encounter (Signed)
 Copied from CRM [id number]. Topic: Clinical - Prescription Issue >> Jul 27, 2023  3:15 PM Ovid Blow wrote: Reason for CRM: Patient needs medication canceled and discontinued. metoprolol  succinate (TOPROL -XL) 50 MG 24 hr tablet , Patient is currently taking metoprolol  tartrate (LOPRESSOR ) 25 MG tablet from Dr. Arlester Ladd

## 2023-07-28 NOTE — Telephone Encounter (Signed)
 This was probably done inadvertantly, but still should be ok to take, and make sure to continue f/u with Dr Arlester Ladd as well ,  thanks

## 2023-10-19 ENCOUNTER — Telehealth: Payer: Self-pay

## 2023-10-19 ENCOUNTER — Other Ambulatory Visit: Payer: Self-pay | Admitting: Internal Medicine

## 2023-10-19 NOTE — Telephone Encounter (Unsigned)
 Copied from CRM [id number]. Topic: Clinical - Medication Question >> Oct 19, 2023  2:14 PM [place] J wrote: Reason for CRM: Patient was wondering why Dr. Norleen denied her request for metoprolol  succinate (TOPROL -XL) 50 MG 24 hr tablet. >> Oct 19, 2023  2:43 PM Lavanda D wrote: Patient is calling again to see why her refill has been refused, please call back with more details when available. Patient has 3 days left so she runs out on the 24th, sometimes she said there can be issues with cutting the tablets in half.

## 2023-10-20 NOTE — Telephone Encounter (Unsigned)
 Copied from CRM [id number]. Topic: Clinical - Medication Question >> Oct 20, 2023 11:28 AM Revonda D wrote: Reason for CRM: Patient was wondering why Dr. Norleen denied her request for metoprolol  succinate (TOPROL -XL) 50 MG 24 hr tablet.  Patient is calling again to see why her refill has been refused, please call back with more details when available. Patient has 3 days left so she runs out on the 24th, sometimes she said there can be issues with cutting the tablets in half.   UPDATE: Pt is calling back to see why her medication request was denied. Pt would like a callback today with an update.

## 2023-10-21 NOTE — Telephone Encounter (Signed)
 Attempted to call patient to follow up but was not able to leave a voice message. Denial reason was, at least on our side, patient was showing early for refill. Patient should still have another quarter left of medications. If pharmacy does not still have this final refill left we can fill it, but wanted to confirm with patient first

## 2023-10-22 ENCOUNTER — Ambulatory Visit (INDEPENDENT_AMBULATORY_CARE_PROVIDER_SITE_OTHER): Admitting: Internal Medicine

## 2023-10-22 ENCOUNTER — Ambulatory Visit: Payer: Self-pay | Admitting: Internal Medicine

## 2023-10-22 ENCOUNTER — Encounter: Payer: Self-pay | Admitting: Internal Medicine

## 2023-10-22 VITALS — BP 110/80 | HR 81 | Temp 97.7°F | Ht 64.0 in | Wt 101.4 lb

## 2023-10-22 DIAGNOSIS — I5032 Chronic diastolic (congestive) heart failure: Secondary | ICD-10-CM

## 2023-10-22 DIAGNOSIS — N1831 Chronic kidney disease, stage 3a: Secondary | ICD-10-CM

## 2023-10-22 DIAGNOSIS — E782 Mixed hyperlipidemia: Secondary | ICD-10-CM | POA: Diagnosis not present

## 2023-10-22 DIAGNOSIS — D500 Iron deficiency anemia secondary to blood loss (chronic): Secondary | ICD-10-CM | POA: Diagnosis not present

## 2023-10-22 DIAGNOSIS — M19041 Primary osteoarthritis, right hand: Secondary | ICD-10-CM | POA: Diagnosis not present

## 2023-10-22 DIAGNOSIS — E559 Vitamin D deficiency, unspecified: Secondary | ICD-10-CM | POA: Diagnosis not present

## 2023-10-22 DIAGNOSIS — M19042 Primary osteoarthritis, left hand: Secondary | ICD-10-CM

## 2023-10-22 DIAGNOSIS — E538 Deficiency of other specified B group vitamins: Secondary | ICD-10-CM

## 2023-10-22 DIAGNOSIS — I10 Essential (primary) hypertension: Secondary | ICD-10-CM | POA: Diagnosis not present

## 2023-10-22 DIAGNOSIS — J449 Chronic obstructive pulmonary disease, unspecified: Secondary | ICD-10-CM

## 2023-10-22 DIAGNOSIS — R739 Hyperglycemia, unspecified: Secondary | ICD-10-CM

## 2023-10-22 LAB — BASIC METABOLIC PANEL WITH GFR
BUN: 24 mg/dL — ABNORMAL HIGH (ref 6–23)
CO2: 27 meq/L (ref 19–32)
Calcium: 9.8 mg/dL (ref 8.4–10.5)
Chloride: 102 meq/L (ref 96–112)
Creatinine, Ser: 1.03 mg/dL (ref 0.40–1.20)
GFR: 48.62 mL/min — ABNORMAL LOW (ref >60.00)
Glucose, Bld: 95 mg/dL (ref 70–99)
Potassium: 4.2 meq/L (ref 3.5–5.1)
Sodium: 136 meq/L (ref 135–145)

## 2023-10-22 LAB — HEMOGLOBIN A1C: Hgb A1c MFr Bld: 6.3 % (ref 4.6–6.5)

## 2023-10-22 LAB — CBC WITH DIFFERENTIAL/PLATELET
Basophils Absolute: 0 K/uL (ref 0.0–0.1)
Basophils Relative: 0.9 % (ref 0.0–3.0)
Eosinophils Absolute: 0.1 K/uL (ref 0.0–0.7)
Eosinophils Relative: 2.8 % (ref 0.0–5.0)
HCT: 34 % — ABNORMAL LOW (ref 36.0–46.0)
Hemoglobin: 11.7 g/dL — ABNORMAL LOW (ref 12.0–15.0)
Lymphocytes Relative: 33.8 % (ref 12.0–46.0)
Lymphs Abs: 1.6 K/uL (ref 0.7–4.0)
MCHC: 34.4 g/dL (ref 30.0–36.0)
MCV: 101.4 fl — ABNORMAL HIGH (ref 78.0–100.0)
Monocytes Absolute: 0.7 K/uL (ref 0.1–1.0)
Monocytes Relative: 14.1 % — ABNORMAL HIGH (ref 3.0–12.0)
Neutro Abs: 2.3 K/uL (ref 1.4–7.7)
Neutrophils Relative %: 48.4 % (ref 43.0–77.0)
Platelets: 209 K/uL (ref 150.0–400.0)
RBC: 3.35 Mil/uL — ABNORMAL LOW (ref 3.87–5.11)
RDW: 12.6 % (ref 11.5–15.5)
WBC: 4.8 K/uL (ref 4.0–10.5)

## 2023-10-22 LAB — HEPATIC FUNCTION PANEL
ALT: 12 U/L (ref 0–35)
AST: 22 U/L (ref 0–37)
Albumin: 4.4 g/dL (ref 3.5–5.2)
Alkaline Phosphatase: 48 U/L (ref 39–117)
Bilirubin, Direct: 0.1 mg/dL (ref 0.0–0.3)
Total Bilirubin: 0.4 mg/dL (ref 0.2–1.2)
Total Protein: 7.6 g/dL (ref 6.0–8.3)

## 2023-10-22 LAB — VITAMIN B12: Vitamin B-12: 918 pg/mL — ABNORMAL HIGH (ref 211–911)

## 2023-10-22 LAB — LIPID PANEL
Cholesterol: 167 mg/dL (ref 0–200)
HDL: 74.6 mg/dL (ref >39.00)
LDL Cholesterol: 74 mg/dL (ref 0–99)
NonHDL: 92.84
Total CHOL/HDL Ratio: 2
Triglycerides: 96 mg/dL (ref 0.0–149.0)
VLDL: 19.2 mg/dL (ref 0.0–40.0)

## 2023-10-22 LAB — VITAMIN D 25 HYDROXY (VIT D DEFICIENCY, FRACTURES): VITD: 44.52 ng/mL (ref 30.00–100.00)

## 2023-10-22 LAB — TSH: TSH: 1.37 u[IU]/mL (ref 0.35–5.50)

## 2023-10-22 MED ORDER — METOPROLOL SUCCINATE ER 50 MG PO TB24: ORAL_TABLET | ORAL | 3 refills | Status: DC

## 2023-10-22 NOTE — Progress Notes (Signed)
 Patient ID: Lindsay Doyle, female   DOB: [birth year], 89 y.o.   MRN: [id number]        Chief Complaint: follow up  hand arthritis, copd, ckd3a, hyperglycemia, hld, iron  def anemia       HPI:  Lindsay Doyle is a 89 y.o. female here with c/o worsening bilateral hand arthritis pains without swelling, fever or trauma.  Pt denies chest pain, increased sob or doe, wheezing, orthopnea, PND, increased LE swelling, palpitations, dizziness or syncope.   Pt denies polydipsia, polyuria, or new focal neuro s/s.    Pt denies fever, wt loss, night sweats, loss of appetite, or other constitutional symptoms  Pt states she is lonely living alone but has neighbors and nephew that visit frequently.  No falls.  Good med compliance.    Wt Readings from Last 3 Encounters:  10/22/23 101 lb 6.4 oz (46 kg)  05/06/23 103 lb (46.7 kg)  04/08/23 101 lb 12.8 oz (46.2 kg)   BP Readings from Last 3 Encounters:  10/22/23 110/80  05/06/23 122/76  04/08/23 110/80         Past Medical History:  Diagnosis Date   ANEMIA-IRON  DEFICIENCY    presumed GI bleed 08/2012- anticoag stopped   Atrial fibrillation (HCC)    a. Apixaban  started 03/2012 - stopped after admx for GI bleed 6/14   Blood transfusion    a. 05/2010: 3 transfusions for anemia/GIB.   CAD (coronary artery disease)    a. LHC 1/14:  mLAD serial 30 and 60%, oRCA 30%, EF 60%, Afib,    Cataract    COLONIC POLYPS, HX OF 03/09/2007   COPD 03/09/2007   ? pt is unsure   GERD (gastroesophageal reflux disease)    GI bleed    thought to be due to AVMs   Hx of echocardiogram    a. echo 2/11:  EF 55-60%, mild AI, mild RAE, mild to mod TR;   b.  a. Echo 2/14:  Mild focal basal and mild LVH, EF 60-65%, mild AI, mild RAE, trivia effusion   HYPERGLYCEMIA 07/02/2007   Hyperlipidemia    Hypertension 11/12/2006   LEUKOPENIA, MILD 05/30/2008   Mitral valve prolapse    Remotely - last echo 2011 did not show this   NSTEMI (non-ST elevated myocardial infarction) (HCC) 03/2012   a.  Type II in setting of AF with RVR 03/2012   OSTEOARTHRITIS 02/19/2009   Spinal   OSTEOPOROSIS 11/12/2006   Positive H. pylori test 10/1996   PSVT    a. Remotely, in setting of anemia.   Spinal stenosis of lumbar region 07/16/2012   Transfusion history    7/15    Past Surgical History:  Procedure Laterality Date   ABDOMINAL HYSTERECTOMY     CARDIAC CATHETERIZATION  1992   S/P in 1992 at [facility] in [place]  [place]. This was negative for any coronary artery disease   carotid duplex  08/29/2003   CATARACT EXTRACTION     ceasarean     ENTEROSCOPY N/A 10/10/2013   Procedure: ENTEROSCOPY;  Surgeon: Lamar JONETTA Aho, MD;  Location: [facility];  Service: Endoscopy;  Laterality: N/A;   ENTEROSCOPY N/A 12/28/2014   Procedure: ENTEROSCOPY;  Surgeon: Lamar JONETTA Aho, MD;  Location: [facility];  Service: Endoscopy;  Laterality: N/A;   ENTEROSCOPY N/A 01/20/2016   Procedure: ENTEROSCOPY;  Surgeon: Elspeth Deward Naval, MD;  Location: [facility] ENDOSCOPY;  Service: Gastroenterology;  Laterality: N/A;   HOT HEMOSTASIS N/A 12/28/2014   Procedure:  HOT HEMOSTASIS (ARGON PLASMA COAGULATION/BICAP);  Surgeon: Lamar JONETTA Aho, MD;  Location: [facility];  Service: Endoscopy;  Laterality: N/A;   LEFT HEART CATHETERIZATION WITH CORONARY ANGIOGRAM N/A 04/23/2012   Procedure: LEFT HEART CATHETERIZATION WITH CORONARY ANGIOGRAM;  Surgeon: Ezra GORMAN Shuck, MD;  Location: [facility] CATH LAB;  Service: Cardiovascular;  Laterality: N/A;   TONSILLECTOMY     TUBAL LIGATION      reports that she has never smoked. She has never used smokeless tobacco. She reports that she does not drink alcohol and does not use drugs. family history includes Colon cancer in her brother; Prostate cancer in her brother; Sarcoidosis in her sister and another family member; Stomach cancer in her mother. Allergies  Allergen Reactions   Doxycycline Diarrhea    Possible diarrhea   Clarithromycin  Other (See Comments)    diarrhea    Metronidazole  Other (See Comments)    Pt had difficulty swallowing this and says it was horrible to take. No allergic reaction.   Current Outpatient Medications on File Prior to Visit  Medication Sig Dispense Refill   acetaminophen  (TYLENOL ) 500 MG tablet Take 2 tablets (1,000 mg total) by mouth 2 (two) times daily. 60 tablet 11   atorvastatin  (LIPITOR) 20 MG tablet Take 1 tablet (20 mg total) by mouth daily. 90 tablet 3   metoprolol  tartrate (LOPRESSOR ) 25 MG tablet TAKE 1 TABLET (25 MG TOTAL) BY MOUTH DAILY AS NEEDED (PALPITATIONS). 90 tablet 3   Multiple Vitamin (MULTIVITAMIN WITH MINERALS) TABS tablet Take 1 tablet by mouth daily. Centrum Silver     nitroGLYCERIN  (NITROSTAT ) 0.4 MG SL tablet Place 1 tablet (0.4 mg total) under the tongue every 5 (five) minutes as needed for chest pain. 75 tablet 2   No current facility-administered medications on file prior to visit.        ROS:  All others reviewed and negative.  Objective        PE:  BP 110/80   Pulse 81   Temp 97.7 F (36.5 C)   Ht 5' 4 (1.626 m)   Wt 101 lb 6.4 oz (46 kg)   SpO2 99%   BMI 17.41 kg/m                 Constitutional: Pt appears in NAD               HENT: Head: NCAT.                Right Ear: External ear normal.                 Left Ear: External ear normal.                Eyes: . Pupils are equal, round, and reactive to light. Conjunctivae and EOM are normal               Nose: without d/c or deformity               Neck: Neck supple. Gross normal ROM               Cardiovascular: Normal rate and regular rhythm.                 Pulmonary/Chest: Effort normal and breath sounds without rales or wheezing.                Abd:  Soft, NT, ND, + BS, no organomegaly  Neurological: Pt is alert. At baseline orientation, motor grossly intact               Skin: Skin is warm. No rashes, no other new lesions, LE edema - none               Psychiatric: Pt behavior is normal without agitation   Micro:  none  Cardiac tracings I have personally interpreted today:  none  Pertinent Radiological findings (summarize): none   Lab Results  Component Value Date   WBC 4.8 10/22/2023   HGB 11.7 (L) 10/22/2023   HCT 34.0 (L) 10/22/2023   PLT 209.0 10/22/2023   GLUCOSE 95 10/22/2023   CHOL 167 10/22/2023   TRIG 96.0 10/22/2023   HDL 74.60 10/22/2023   LDLDIRECT 199.0 09/17/2020   LDLCALC 74 10/22/2023   ALT 12 10/22/2023   AST 22 10/22/2023   NA 136 10/22/2023   K 4.2 10/22/2023   CL 102 10/22/2023   CREATININE 1.03 10/22/2023   BUN 24 (H) 10/22/2023   CO2 27 10/22/2023   TSH 1.37 10/22/2023   INR 1.06 01/20/2016   HGBA1C 6.3 10/22/2023   Assessment/Plan:  Lindsay Doyle is a 89 y.o. Black or African American [2] female with  has a past medical history of ANEMIA-IRON  DEFICIENCY, Atrial fibrillation (HCC), Blood transfusion, CAD (coronary artery disease), Cataract, COLONIC POLYPS, HX OF (03/09/2007), COPD (03/09/2007), GERD (gastroesophageal reflux disease), GI bleed, echocardiogram, HYPERGLYCEMIA (07/02/2007), Hyperlipidemia, Hypertension (11/12/2006), LEUKOPENIA, MILD (05/30/2008), Mitral valve prolapse, NSTEMI (non-ST elevated myocardial infarction) (HCC) (03/2012), OSTEOARTHRITIS (02/19/2009), OSTEOPOROSIS (11/12/2006), Positive H. pylori test (10/1996), PSVT, Spinal stenosis of lumbar region (07/16/2012), and Transfusion history.  COPD (chronic obstructive pulmonary disease) (HCC) Overall stable, cont inhaler asd  Chronic diastolic heart failure (HCC) Stable volume, cont current med tx  CKD (chronic kidney disease), stage III Lab Results  Component Value Date   CREATININE 1.03 10/22/2023   Stable overall, cont to avoid nephrotoxins   Essential hypertension BP Readings from Last 3 Encounters:  10/22/23 110/80  05/06/23 122/76  04/08/23 110/80   Stable, pt to continue medical treatment toprol  xl 75 qd   Hand arthritis With mild worsening symptoms, for otc voltaren  gel as  needed  Hyperglycemia Lab Results  Component Value Date   HGBA1C 6.3 10/22/2023   Stable, pt to continue current medical treatment  - diet, wt control   Hyperlipidemia Lab Results  Component Value Date   LDLCALC 74 10/22/2023   Stable, pt to continue current statin lipitor 20 qd   Iron  deficiency anemia No recent overt bleeding, for f/u iron  level  Vitamin D  deficiency Last vitamin D  Lab Results  Component Value Date   VD25OH 44.52 10/22/2023   Stable, cont oral replacement  Followup: Return in about 6 months (around 04/23/2024).  Lynwood Rush, MD 10/24/2023 2:36 PM [facility] [place] [facility]

## 2023-10-22 NOTE — Patient Instructions (Addendum)
 Please have your Shingrix (shingles) shots done at your local pharmacy, and the Tdap tetanus shot  Ok to use the OTC Voltaren  gel as needed for the hand arthritis  Please continue all other medications as before, and refills have been done if requested.  Please have the pharmacy call with any other refills you may need.  Please continue your efforts at being more active, low cholesterol diet, and weight control  Please keep your appointments with your specialists as you may have planned  Please go to the LAB at the blood drawing area for the tests to be done  You will be contacted by phone if any changes need to be made immediately.  Otherwise, you will receive a letter about your results with an explanation, but please check with MyChart first.  Please make an Appointment to return in 6 months, or sooner if needed

## 2023-10-22 NOTE — Progress Notes (Signed)
 The test results show that your current treatment is OK, as the tests are stable.  Please continue the same plan.  There is no other need for change of treatment or further evaluation based on these results, at this time.  thanks

## 2023-10-24 ENCOUNTER — Encounter: Payer: Self-pay | Admitting: Internal Medicine

## 2023-10-24 NOTE — Assessment & Plan Note (Signed)
 BP Readings from Last 3 Encounters:  10/22/23 110/80  05/06/23 122/76  04/08/23 110/80   Stable, pt to continue medical treatment toprol  xl 75 qd

## 2023-10-24 NOTE — Assessment & Plan Note (Signed)
 With mild worsening symptoms, for otc voltaren  gel as needed

## 2023-10-24 NOTE — Assessment & Plan Note (Signed)
No recent overt bleeding, for f/u iron level 

## 2023-10-24 NOTE — Assessment & Plan Note (Signed)
 Overall stable, cont inhaler asd

## 2023-10-24 NOTE — Assessment & Plan Note (Signed)
 Lab Results  Component Value Date   HGBA1C 6.3 10/22/2023   Stable, pt to continue current medical treatment  - diet, wt control

## 2023-10-24 NOTE — Assessment & Plan Note (Signed)
 Lab Results  Component Value Date   CREATININE 1.03 10/22/2023   Stable overall, cont to avoid nephrotoxins

## 2023-10-24 NOTE — Assessment & Plan Note (Signed)
 Lab Results  Component Value Date   LDLCALC 74 10/22/2023   Stable, pt to continue current statin lipitor 20 qd

## 2023-10-24 NOTE — Assessment & Plan Note (Signed)
 Last vitamin D  Lab Results  Component Value Date   VD25OH 44.52 10/22/2023   Stable, cont oral replacement

## 2023-10-24 NOTE — Assessment & Plan Note (Signed)
Stable volume, cont current med tx 

## 2023-11-03 ENCOUNTER — Ambulatory Visit

## 2023-11-03 ENCOUNTER — Ambulatory Visit: Payer: Medicare Other | Admitting: Internal Medicine

## 2023-11-12 DIAGNOSIS — Z23 Encounter for immunization: Secondary | ICD-10-CM | POA: Diagnosis not present

## 2023-11-20 ENCOUNTER — Ambulatory Visit

## 2023-12-08 ENCOUNTER — Telehealth: Payer: Self-pay

## 2023-12-08 NOTE — Telephone Encounter (Signed)
 Copied from CRM [id number]. Topic: Appointments - Scheduling Inquiry for Clinic >> Dec 08, 2023 10:51 AM Precious C wrote: Reason for CRM: Patient called in to schedule an appointment for AWV. During scheduling, an AWV appointment was also prompted, which appeared to be for a physical.  I advised the patient that fasting may be required for blood work during a physical. However, the patient stated she is currently taking medication that must be taken with food.  I contacted the clinic and was advised to send a message regarding the appointment so the provider can decide how to proceed.

## 2023-12-16 NOTE — Telephone Encounter (Signed)
 No, ok to take the meds without food that day only     thanks

## 2023-12-17 NOTE — Telephone Encounter (Signed)
 Called and informed patient of PCP response. Patient expressed understanding

## 2024-01-09 ENCOUNTER — Other Ambulatory Visit: Payer: Self-pay | Admitting: Cardiovascular Disease

## 2024-02-03 ENCOUNTER — Ambulatory Visit (INDEPENDENT_AMBULATORY_CARE_PROVIDER_SITE_OTHER)

## 2024-02-03 ENCOUNTER — Ambulatory Visit (INDEPENDENT_AMBULATORY_CARE_PROVIDER_SITE_OTHER): Admitting: Internal Medicine

## 2024-02-03 ENCOUNTER — Encounter: Payer: Self-pay | Admitting: Internal Medicine

## 2024-02-03 VITALS — BP 120/80 | HR 74 | Ht 64.0 in | Wt 101.6 lb

## 2024-02-03 VITALS — BP 120/76 | HR 94 | Temp 98.3°F | Ht 64.0 in | Wt 102.0 lb

## 2024-02-03 DIAGNOSIS — I10 Essential (primary) hypertension: Secondary | ICD-10-CM

## 2024-02-03 DIAGNOSIS — R739 Hyperglycemia, unspecified: Secondary | ICD-10-CM

## 2024-02-03 DIAGNOSIS — E559 Vitamin D deficiency, unspecified: Secondary | ICD-10-CM | POA: Diagnosis not present

## 2024-02-03 DIAGNOSIS — E782 Mixed hyperlipidemia: Secondary | ICD-10-CM | POA: Diagnosis not present

## 2024-02-03 DIAGNOSIS — Z0000 Encounter for general adult medical examination without abnormal findings: Secondary | ICD-10-CM | POA: Diagnosis not present

## 2024-02-03 DIAGNOSIS — N1831 Chronic kidney disease, stage 3a: Secondary | ICD-10-CM

## 2024-02-03 DIAGNOSIS — E538 Deficiency of other specified B group vitamins: Secondary | ICD-10-CM

## 2024-02-03 DIAGNOSIS — D500 Iron deficiency anemia secondary to blood loss (chronic): Secondary | ICD-10-CM

## 2024-02-03 LAB — CBC WITH DIFFERENTIAL/PLATELET
Basophils Absolute: 0 K/uL (ref 0.0–0.1)
Basophils Relative: 0.4 % (ref 0.0–3.0)
Eosinophils Absolute: 0 K/uL (ref 0.0–0.7)
Eosinophils Relative: 0.8 % (ref 0.0–5.0)
HCT: 40.3 % (ref 36.0–46.0)
Hemoglobin: 13.6 g/dL (ref 12.0–15.0)
Lymphocytes Relative: 25.9 % (ref 12.0–46.0)
Lymphs Abs: 1.5 K/uL (ref 0.7–4.0)
MCHC: 33.8 g/dL (ref 30.0–36.0)
MCV: 102.3 fl — ABNORMAL HIGH (ref 78.0–100.0)
Monocytes Absolute: 0.4 K/uL (ref 0.1–1.0)
Monocytes Relative: 6.7 % (ref 3.0–12.0)
Neutro Abs: 3.9 K/uL (ref 1.4–7.7)
Neutrophils Relative %: 66.2 % (ref 43.0–77.0)
Platelets: 211 K/uL (ref 150.0–400.0)
RBC: 3.94 Mil/uL (ref 3.87–5.11)
RDW: 12.9 % (ref 11.5–15.5)
WBC: 5.8 K/uL (ref 4.0–10.5)

## 2024-02-03 LAB — BASIC METABOLIC PANEL WITH GFR
BUN: 18 mg/dL (ref 6–23)
CO2: 29 meq/L (ref 19–32)
Calcium: 9.7 mg/dL (ref 8.4–10.5)
Chloride: 102 meq/L (ref 96–112)
Creatinine, Ser: 1.27 mg/dL — ABNORMAL HIGH (ref 0.40–1.20)
GFR: 37.74 mL/min — ABNORMAL LOW (ref >60.00)
Glucose, Bld: 103 mg/dL — ABNORMAL HIGH (ref 70–99)
Potassium: 4 meq/L (ref 3.5–5.1)
Sodium: 140 meq/L (ref 135–145)

## 2024-02-03 LAB — LIPID PANEL
Cholesterol: 199 mg/dL (ref 0–200)
HDL: 94.9 mg/dL (ref >39.00)
LDL Cholesterol: 87 mg/dL (ref 0–99)
NonHDL: 104.3
Total CHOL/HDL Ratio: 2
Triglycerides: 87 mg/dL (ref 0.0–149.0)
VLDL: 17.4 mg/dL (ref 0.0–40.0)

## 2024-02-03 LAB — IBC PANEL
Iron: 74 ug/dL (ref 42–145)
Saturation Ratios: 15.8 % — ABNORMAL LOW (ref 20.0–50.0)
TIBC: 469 ug/dL — ABNORMAL HIGH (ref 250.0–450.0)
Transferrin: 335 mg/dL (ref 212.0–360.0)

## 2024-02-03 LAB — URINALYSIS, ROUTINE W REFLEX MICROSCOPIC
Bilirubin Urine: NEGATIVE
Ketones, ur: NEGATIVE
Leukocytes,Ua: NEGATIVE
Nitrite: NEGATIVE
Specific Gravity, Urine: 1.015 (ref 1.000–1.030)
Urine Glucose: NEGATIVE
Urobilinogen, UA: 0.2 (ref 0.0–1.0)
pH: 6.5 (ref 5.0–8.0)

## 2024-02-03 LAB — HEMOGLOBIN A1C: Hgb A1c MFr Bld: 6.2 % (ref 4.6–6.5)

## 2024-02-03 LAB — HEPATIC FUNCTION PANEL
ALT: 16 U/L (ref 0–35)
AST: 26 U/L (ref 0–37)
Albumin: 4.8 g/dL (ref 3.5–5.2)
Alkaline Phosphatase: 51 U/L (ref 39–117)
Bilirubin, Direct: 0.1 mg/dL (ref 0.0–0.3)
Total Bilirubin: 0.5 mg/dL (ref 0.2–1.2)
Total Protein: 8.2 g/dL (ref 6.0–8.3)

## 2024-02-03 MED ORDER — METOPROLOL SUCCINATE ER 50 MG PO TB24: ORAL_TABLET | ORAL | 3 refills | Status: DC

## 2024-02-03 MED ORDER — ATORVASTATIN CALCIUM 20 MG PO TABS: 20.0000 mg | ORAL_TABLET | Freq: Every day | ORAL | 3 refills | Status: DC

## 2024-02-03 NOTE — Assessment & Plan Note (Signed)
 BP Readings from Last 3 Encounters:  02/03/24 120/76  02/03/24 120/80  10/22/23 110/80   Stable, pt to continue medical treatment toprol  xl 50 mg - 1.5 tab per day

## 2024-02-03 NOTE — Patient Instructions (Addendum)
 Ms. Gubler,  Thank you for taking the time for your Medicare Wellness Visit. I appreciate your continued commitment to your health goals. Please review the care plan we discussed, and feel free to reach out if I can assist you further.  Please note that Annual Wellness Visits do not include a physical exam. Some assessments may be limited, especially if the visit was conducted virtually. If needed, we may recommend an in-person follow-up with your provider.  Ongoing Care Seeing your primary care provider every 3 to 6 months helps us  monitor your health and provide consistent, personalized care.   Referrals If a referral was made during today's visit and you haven't received any updates within two weeks, please contact the referred provider directly to check on the status.  Recommended Screenings:  Health Maintenance  Topic Date Due   Flu Shot  10/30/2023   COVID-19 Vaccine (7 - 2025-26 season) 01/07/2024   Zoster (Shingles) Vaccine (1 of 2) 05/05/2024*   DTaP/Tdap/Td vaccine (3 - Tdap) 02/02/2025*   Medicare Annual Wellness Visit  02/02/2025   Pneumococcal Vaccine for age over 18  Completed   DEXA scan (bone density measurement)  Completed   Meningitis B Vaccine  Aged Out  *Topic was postponed. The date shown is not the original due date.       02/03/2024    9:00 AM  Advanced Directives  Does Patient Have a Medical Advance Directive? Yes  Type of Estate Agent of [place];Living will  Does patient want to make changes to medical advance directive? Yes (Inpatient - patient requests chaplain consult to change a medical advance directive)  Copy of Healthcare Power of Attorney in Chart? No - copy requested    Vision: Annual vision screenings are recommended for early detection of glaucoma, cataracts, and diabetic retinopathy. These exams can also reveal signs of chronic conditions such as diabetes and high blood pressure.  Dental: Annual dental screenings help  detect early signs of oral cancer, gum disease, and other conditions linked to overall health, including heart disease and diabetes.

## 2024-02-03 NOTE — Assessment & Plan Note (Signed)
 Ckd 3a  Lab Results  Component Value Date   CREATININE 1.03 10/22/2023   Stable overall, cont to avoid nephrotoxins

## 2024-02-03 NOTE — Progress Notes (Signed)
 Subjective:   Lindsay Doyle is a 89 y.o. female who presents for a [employer] Visit.  I have recommended that this patient have a immunization for Tdap, Shingles, and Influenza but she declines at this time. I have discussed the risks and benefits of this procedure with her. The patient verbalizes understanding.   Allergies (verified) Doxycycline, Clarithromycin , and Metronidazole    History: Past Medical History:  Diagnosis Date   ANEMIA-IRON  DEFICIENCY    presumed GI bleed 08/2012- anticoag stopped   Atrial fibrillation (HCC)    a. Apixaban  started 03/2012 - stopped after admx for GI bleed 6/14   Blood transfusion    a. 05/2010: 3 transfusions for anemia/GIB.   CAD (coronary artery disease)    a. LHC 1/14:  mLAD serial 30 and 60%, oRCA 30%, EF 60%, Afib,    Cataract    COLONIC POLYPS, HX OF 03/09/2007   COPD 03/09/2007   ? pt is unsure   GERD (gastroesophageal reflux disease)    GI bleed    thought to be due to AVMs   Hx of echocardiogram    a. echo 2/11:  EF 55-60%, mild AI, mild RAE, mild to mod TR;   b.  a. Echo 2/14:  Mild focal basal and mild LVH, EF 60-65%, mild AI, mild RAE, trivia effusion   HYPERGLYCEMIA 07/02/2007   Hyperlipidemia    Hypertension 11/12/2006   LEUKOPENIA, MILD 05/30/2008   Mitral valve prolapse    Remotely - last echo 2011 did not show this   NSTEMI (non-ST elevated myocardial infarction) (HCC) 03/2012   a. Type II in setting of AF with RVR 03/2012   OSTEOARTHRITIS 02/19/2009   Spinal   OSTEOPOROSIS 11/12/2006   Positive H. pylori test 10/1996   PSVT    a. Remotely, in setting of anemia.   Spinal stenosis of lumbar region 07/16/2012   Transfusion history    7/15    Past Surgical History:  Procedure Laterality Date   ABDOMINAL HYSTERECTOMY     CARDIAC CATHETERIZATION  1992   S/P in 1992 at [facility] in [place]. This was negative for any coronary artery disease   carotid duplex  08/29/2003   CATARACT  EXTRACTION     ceasarean     ENTEROSCOPY N/A 10/10/2013   Procedure: ENTEROSCOPY;  Surgeon: Lamar JONETTA Aho, MD;  Location: [facility] ENDOSCOPY;  Service: Endoscopy;  Laterality: N/A;   ENTEROSCOPY N/A 12/28/2014   Procedure: ENTEROSCOPY;  Surgeon: Lamar JONETTA Aho, MD;  Location: [facility];  Service: Endoscopy;  Laterality: N/A;   ENTEROSCOPY N/A 01/20/2016   Procedure: ENTEROSCOPY;  Surgeon: Elspeth Deward Naval, MD;  Location: [facility] ENDOSCOPY;  Service: Gastroenterology;  Laterality: N/A;   HOT HEMOSTASIS N/A 12/28/2014   Procedure: HOT HEMOSTASIS (ARGON PLASMA COAGULATION/BICAP);  Surgeon: Lamar JONETTA Aho, MD;  Location: [facility];  Service: Endoscopy;  Laterality: N/A;   LEFT HEART CATHETERIZATION WITH CORONARY ANGIOGRAM N/A 04/23/2012   Procedure: LEFT HEART CATHETERIZATION WITH CORONARY ANGIOGRAM;  Surgeon: Ezra GORMAN Shuck, MD;  Location: [facility] CATH LAB;  Service: Cardiovascular;  Laterality: N/A;   TONSILLECTOMY     TUBAL LIGATION     Family History  Problem Relation Age of Onset   Prostate cancer Brother    Stomach cancer Mother    Sarcoidosis Sister    Colon cancer Brother    Sarcoidosis Other    CAD Neg Hx    Social History   Occupational History   Occupation: retired  Comment: retired  Tobacco Use   Smoking status: Never   Smokeless tobacco: Never  Vaping Use   Vaping status: Never Used  Substance and Sexual Activity   Alcohol use: No   Drug use: No   Sexual activity: Not Currently   Tobacco Counseling Counseling given: Not Answered  SDOH Screenings   Food Insecurity: No Food Insecurity (02/03/2024)  Housing: Low Risk  (02/03/2024)  Transportation Needs: No Transportation Needs (02/03/2024)  Utilities: Not At Risk (02/03/2024)  Alcohol Screen: Low Risk  (01/27/2022)  Depression (PHQ2-9): Low Risk  (02/03/2024)  Financial Resource Strain: Low Risk  (01/27/2022)  Physical Activity: Sufficiently Active (02/03/2024)  Social Connections: Moderately Integrated (02/03/2024)   Stress: No Stress Concern Present (02/03/2024)  Tobacco Use: Low Risk  (02/03/2024)  Health Literacy: Adequate Health Literacy (02/03/2024)   Depression Screen    02/03/2024    9:00 AM 10/22/2023   10:04 AM 05/06/2023    1:14 PM 12/16/2022    8:59 AM 10/10/2022   11:27 AM 04/11/2022   11:31 AM 04/11/2022   11:10 AM  PHQ 2/9 Scores  PHQ - 2 Score 0 0 0 0 1 1   PHQ- 9 Score 0        Exception Documentation       Patient refusal     Goals Addressed               This Visit's Progress     Patient Stated (pt-stated)        Patient stated she plans to continue to take meds daily       Visit info / Clinical Intake: Medicare Wellness Visit Type:: Subsequent Annual Wellness Visit Medicare Wellness Visit Mode:: In-person (required for WTM) Interpreter Needed?: No Pre-visit prep was completed: yes AWV questionnaire completed by patient prior to visit?: no Living arrangements:: (!) lives alone Patient's Overall Health Status Rating: good Typical amount of pain: none Does pain affect daily life?: no Are you currently prescribed opioids?: no  Dietary Habits and Nutritional Risks How many meals a day?: 3 Eats fruit and vegetables daily?: yes Most meals are obtained by: preparing own meals; eating out In the last 2 weeks, have you had any of the following?: -- (none) Diabetic:: no  Functional Status Activities of Daily Living (to include ambulation/medication): Independent Ambulation: Independent with device- listed below Home Assistive Devices/Equipment: Dentures (specify type); Cane; Eyeglasses Medication Administration: Independent Home Management: Needs assistance (comment) Manage your own finances?: yes (Grandson helps) Primary transportation is: driving; family/friends Concerns about vision?: no *vision screening is required for WTM* Concerns about hearing?: no  Fall Screening Falls in the past year?: 0 Number of falls in past year: 0 Was there an injury with Fall?:  0 Fall Risk Category Calculator: 0 Patient Fall Risk Level: Low Fall Risk  Fall Risk Patient at Risk for Falls Due to: Impaired balance/gait; No Fall Risks Fall risk Follow up: Falls evaluation completed; Falls prevention discussed  Home and Transportation Safety: All rugs have non-skid backing?: N/A, no rugs All stairs or steps have railings?: N/A, no stairs Grab bars in the bathtub or shower?: yes Have non-skid surface in bathtub or shower?: yes Good home lighting?: yes Regular seat belt use?: yes Hospital stays in the last year:: no  Cognitive Assessment Difficulty concentrating, remembering, or making decisions? : no Will 6CIT or Mini Cog be Completed: yes What year is it?: 0 points What month is it?: 0 points Give patient an address phrase to remember (5 components): [address] [place], [place] About what time is it?: 0 points Count backwards from 20 to 1: 0 points Say the months of the year in reverse: 0 points Repeat the address phrase from earlier: 0 points 6 CIT Score: 0 points  Advance Directives (For Healthcare) Does Patient Have a Medical Advance Directive?: Yes Does patient want to make changes to medical advance directive?: Yes (Inpatient - patient requests chaplain consult to change a medical advance directive) Type of Advance Directive: Healthcare Power of [place]; Living will Copy of Healthcare Power of Attorney in Chart?: No - copy requested Copy of Living Will in Chart?: No - copy requested  Reviewed/Updated  Reviewed/Updated: All        Objective:    Today's Vitals   02/03/24 0904  BP: 120/80  Pulse: 74  SpO2: 97%  Weight: 101 lb 9.6 oz (46.1 kg)  Height: 5' 4 (1.626 m)   Body mass index is 17.44 kg/m.  Current Medications (verified) Outpatient Encounter Medications as of 02/03/2024  Medication Sig   acetaminophen  (TYLENOL ) 500 MG tablet Take 2 tablets (1,000 mg total) by mouth 2 (two) times daily.   atorvastatin  (LIPITOR) 20 MG tablet  Take 1 tablet (20 mg total) by mouth daily.   metoprolol  succinate (TOPROL -XL) 50 MG 24 hr tablet TAKE 1 AND 1/2 TABLETS (75 MG) BY MOUTH EVERY DAY   metoprolol  tartrate (LOPRESSOR ) 25 MG tablet TAKE 1 TABLET (25 MG TOTAL) BY MOUTH DAILY AS NEEDED (PALPITATIONS).   Multiple Vitamin (MULTIVITAMIN WITH MINERALS) TABS tablet Take 1 tablet by mouth daily. Centrum Silver   nitroGLYCERIN  (NITROSTAT ) 0.4 MG SL tablet PLACE 1 TABLET UNDER THE TONGUE EVERY 5 MINUTES AS NEEDED FOR CHEST PAIN.   No facility-administered encounter medications on file as of 02/03/2024.   Hearing/Vision screen Hearing Screening - Comments:: Denies hearing difficulties   Vision Screening - Comments:: Wears rx glasses - up to date with routine eye exams  Immunizations and Health Maintenance Health Maintenance  Topic Date Due   COVID-19 Vaccine (7 - 2025-26 season) 01/07/2024   Zoster Vaccines- Shingrix (1 of 2) 05/05/2024 (Originally [birth year]/1985)   Influenza Vaccine  06/28/2024 (Originally 10/30/2023)   DTaP/Tdap/Td (3 - Tdap) 02/02/2025 (Originally 07/18/2018)   Medicare Annual Wellness (AWV)  02/02/2025   Pneumococcal Vaccine: 50+ Years  Completed   DEXA SCAN  Completed   Meningococcal B Vaccine  Aged Out        Assessment/Plan:  This is a routine wellness examination for Lindsay Doyle.  Patient Care Team: Norleen Lynwood ORN, MD as PCP - General (Internal Medicine) Wonda Sharper, MD as PCP - Cardiology (Cardiology) Cindie Ole DASEN, MD as PCP - Electrophysiology (Cardiology) Armbruster, Elspeth SQUIBB, MD as Consulting Physician (Gastroenterology)  I have personally reviewed and noted the following in the patient's chart:   Medical and social history Use of alcohol, tobacco or illicit drugs  Current medications and supplements including opioid prescriptions. Functional ability and status Nutritional status Physical activity Advanced directives List of other physicians Hospitalizations, surgeries, and ER visits in previous  12 months Vitals Screenings to include cognitive, depression, and falls Referrals and appointments  No orders of the defined types were placed in this encounter.  In addition, I have reviewed and discussed with patient certain preventive protocols, quality metrics, and best practice recommendations. A written personalized care plan for preventive services as well as general preventive health recommendations were provided to patient.   Lindsay Doyle, CMA   02/03/2024   Return in 1 year (on  02/02/2025).  After Visit Summary: (In Person-Declined) Patient declined AVS at this time.  Nurse Notes: Patient plans to discuss the need of Influenza vaccine today at CPE appt w/PCP.

## 2024-02-03 NOTE — Progress Notes (Signed)
 Patient ID: Lindsay Doyle, female   DOB: 09-08-1933, 89 y.o.   MRN: [id number]         Chief Complaint:: yearly exam       HPI:  Lindsay Doyle is a 89 y.o. female here as above,               Sister now in memory care, now sad when visits. Does home exercise 5 days per wk  Takes tylneol 1000 bid daily for hand arthritis pain.Pt denies chest pain, increased sob or doe, wheezing, orthopnea, PND, increased LE swelling, palpitations, dizziness or syncope.   Pt denies polydipsia, polyuria, or new focal neuro s/s.    Pt denies fever, wt loss, night sweats, loss of appetite, or other constitutional symptoms     Wt Readings from Last 3 Encounters:  02/03/24 102 lb (46.3 kg)  02/03/24 101 lb 9.6 oz (46.1 kg)  10/22/23 101 lb 6.4 oz (46 kg)   BP Readings from Last 3 Encounters:  02/03/24 120/76  02/03/24 120/80  10/22/23 110/80   Immunization History  Administered Date(s) Administered   Fluad Quad(high Dose 65+) 12/23/2018, 01/25/2020, 01/19/2021, 12/26/2021   INFLUENZA, HIGH DOSE SEASONAL PF 01/25/2013, 03/06/2015, 01/16/2017, 01/08/2018   Influenza Split 03/10/2011, 01/08/2012   Influenza Whole 02/07/2008, 02/05/2009, 02/18/2010   Influenza,inj,Quad PF,6+ Mos 01/25/2014, 01/03/2016   Moderna Covid-19 Fall Seasonal Vaccine 32yrs & older 01/06/2022   PFIZER Comirnaty(Gray Top)Covid-19 Tri-Sucrose Vaccine 07/30/2020   PFIZER(Purple Top)SARS-COV-2 Vaccination 05/14/2019, 06/06/2019, 02/11/2020, 01/04/2024   Pfizer(Comirnaty)Fall Seasonal Vaccine 12 years and older 11/12/2023   Pneumococcal Conjugate-13 02/08/2013   Pneumococcal Polysaccharide-23 12/29/1997, 01/08/2012   Td 07/29/1996, 07/17/2008   There are no preventive care reminders to display for this patient.     Past Medical History:  Diagnosis Date   ANEMIA-IRON  DEFICIENCY    presumed GI bleed 08/2012- anticoag stopped   Atrial fibrillation (HCC)    a. Apixaban  started 03/2012 - stopped after admx for GI bleed 6/14   Blood  transfusion    a. 05/2010: 3 transfusions for anemia/GIB.   CAD (coronary artery disease)    a. LHC 1/14:  mLAD serial 30 and 60%, oRCA 30%, EF 60%, Afib,    Cataract    COLONIC POLYPS, HX OF 03/09/2007   COPD 03/09/2007   ? pt is unsure   GERD (gastroesophageal reflux disease)    GI bleed    thought to be due to AVMs   Hx of echocardiogram    a. echo 2/11:  EF 55-60%, mild AI, mild RAE, mild to mod TR;   b.  a. Echo 2/14:  Mild focal basal and mild LVH, EF 60-65%, mild AI, mild RAE, trivia effusion   HYPERGLYCEMIA 07/02/2007   Hyperlipidemia    Hypertension 11/12/2006   LEUKOPENIA, MILD 05/30/2008   Mitral valve prolapse    Remotely - last echo 2011 did not show this   NSTEMI (non-ST elevated myocardial infarction) (HCC) 03/2012   a. Type II in setting of AF with RVR 03/2012   OSTEOARTHRITIS 02/19/2009   Spinal   OSTEOPOROSIS 11/12/2006   Positive H. pylori test 10/1996   PSVT    a. Remotely, in setting of anemia.   Spinal stenosis of lumbar region 07/16/2012   Transfusion history    7/15    Past Surgical History:  Procedure Laterality Date   ABDOMINAL HYSTERECTOMY     CARDIAC CATHETERIZATION  1992   S/P in 1992 at [facility] in [place].  This was negative for any coronary artery disease   carotid duplex  08/29/2003   CATARACT EXTRACTION     ceasarean     ENTEROSCOPY N/A 10/10/2013   Procedure: ENTEROSCOPY;  Surgeon: Lamar JONETTA Aho, MD;  Location: [facility];  Service: Endoscopy;  Laterality: N/A;   ENTEROSCOPY N/A 12/28/2014   Procedure: ENTEROSCOPY;  Surgeon: Lamar JONETTA Aho, MD;  Location: [facility];  Service: Endoscopy;  Laterality: N/A;   ENTEROSCOPY N/A 01/20/2016   Procedure: ENTEROSCOPY;  Surgeon: Elspeth Deward Naval, MD;  Location: [facility] ENDOSCOPY;  Service: Gastroenterology;  Laterality: N/A;   HOT HEMOSTASIS N/A 12/28/2014   Procedure: HOT HEMOSTASIS (ARGON PLASMA COAGULATION/BICAP);  Surgeon: Lamar JONETTA Aho, MD;  Location: [facility];   Service: Endoscopy;  Laterality: N/A;   LEFT HEART CATHETERIZATION WITH CORONARY ANGIOGRAM N/A 04/23/2012   Procedure: LEFT HEART CATHETERIZATION WITH CORONARY ANGIOGRAM;  Surgeon: Ezra GORMAN Shuck, MD;  Location: [facility] CATH LAB;  Service: Cardiovascular;  Laterality: N/A;   TONSILLECTOMY     TUBAL LIGATION      reports that she has never smoked. She has never used smokeless tobacco. She reports that she does not drink alcohol and does not use drugs. family history includes Colon cancer in her brother; Prostate cancer in her brother; Sarcoidosis in her sister and another family member; Stomach cancer in her mother. Allergies  Allergen Reactions   Doxycycline Diarrhea    Possible diarrhea   Clarithromycin  Other (See Comments)    diarrhea   Metronidazole  Other (See Comments)    Pt had difficulty swallowing this and says it was horrible to take. No allergic reaction.   Current Outpatient Medications on File Prior to Visit  Medication Sig Dispense Refill   acetaminophen  (TYLENOL ) 500 MG tablet Take 2 tablets (1,000 mg total) by mouth 2 (two) times daily. 60 tablet 11   Multiple Vitamin (MULTIVITAMIN WITH MINERALS) TABS tablet Take 1 tablet by mouth daily. Centrum Silver     nitroGLYCERIN  (NITROSTAT ) 0.4 MG SL tablet PLACE 1 TABLET UNDER THE TONGUE EVERY 5 MINUTES AS NEEDED FOR CHEST PAIN. 25 tablet 2   No current facility-administered medications on file prior to visit.        ROS:  All others reviewed and negative.  Objective        PE:  BP 120/76 (BP Location: Right Arm, Patient Position: Sitting, Cuff Size: Normal)   Pulse 94   Temp 98.3 F (36.8 C) (Oral)   Ht 5' 4 (1.626 m)   Wt 102 lb (46.3 kg)   SpO2 91%   BMI 17.51 kg/m                 Constitutional: Pt appears in NAD               HENT: Head: NCAT.                Right Ear: External ear normal.                 Left Ear: External ear normal.                Eyes: . Pupils are equal, round, and reactive to light.  Conjunctivae and EOM are normal               Nose: without d/c or deformity               Neck: Neck supple. Gross normal ROM  Cardiovascular: Normal rate and regular rhythm.                 Pulmonary/Chest: Effort normal and breath sounds without rales or wheezing.                Abd:  Soft, NT, ND, + BS, no organomegaly               Neurological: Pt is alert. At baseline orientation, motor grossly intact               Skin: Skin is warm. No rashes, no other new lesions, LE edema - none               Psychiatric: Pt behavior is normal without agitation   Micro: none  Cardiac tracings I have personally interpreted today:  none  Pertinent Radiological findings (summarize): none   Lab Results  Component Value Date   WBC 4.8 10/22/2023   HGB 11.7 (L) 10/22/2023   HCT 34.0 (L) 10/22/2023   PLT 209.0 10/22/2023   GLUCOSE 95 10/22/2023   CHOL 167 10/22/2023   TRIG 96.0 10/22/2023   HDL 74.60 10/22/2023   LDLDIRECT 199.0 09/17/2020   LDLCALC 74 10/22/2023   ALT 12 10/22/2023   AST 22 10/22/2023   NA 136 10/22/2023   K 4.2 10/22/2023   CL 102 10/22/2023   CREATININE 1.03 10/22/2023   BUN 24 (H) 10/22/2023   CO2 27 10/22/2023   TSH 1.37 10/22/2023   INR 1.06 01/20/2016   HGBA1C 6.3 10/22/2023   Assessment/Plan:  Kriti B. Petronio is a 89 y.o. Black or African American [2] female with  has a past medical history of ANEMIA-IRON  DEFICIENCY, Atrial fibrillation (HCC), Blood transfusion, CAD (coronary artery disease), Cataract, COLONIC POLYPS, HX OF (03/09/2007), COPD (03/09/2007), GERD (gastroesophageal reflux disease), GI bleed, echocardiogram, HYPERGLYCEMIA (07/02/2007), Hyperlipidemia, Hypertension (11/12/2006), LEUKOPENIA, MILD (05/30/2008), Mitral valve prolapse, NSTEMI (non-ST elevated myocardial infarction) (HCC) (03/2012), OSTEOARTHRITIS (02/19/2009), OSTEOPOROSIS (11/12/2006), Positive H. pylori test (10/1996), PSVT, Spinal stenosis of lumbar region (07/16/2012), and  Transfusion history.  Vitamin D  deficiency Last vitamin D  Lab Results  Component Value Date   VD25OH 44.52 10/22/2023   Stable, cont oral replacement   Iron  deficiency anemia Also for iron  lab f/u today, pt asymptomatic  Hyperlipidemia Lab Results  Component Value Date   LDLCALC 74 10/22/2023   uncontrolled, pt to continue current statin lipitor 20 mg and lower chol diet, declines change for now   Hyperglycemia Lab Results  Component Value Date   HGBA1C 6.3 10/22/2023   Stable, pt to continue current medical treatment  - diet, wt control   Essential hypertension BP Readings from Last 3 Encounters:  02/03/24 120/76  02/03/24 120/80  10/22/23 110/80   Stable, pt to continue medical treatment toprol  xl 50 mg - 1.5 tab per day   CKD (chronic kidney disease), stage III (HCC) Ckd 3a  Lab Results  Component Value Date   CREATININE 1.03 10/22/2023   Stable overall, cont to avoid nephrotoxins  Followup: Return in about 6 months (around 08/02/2024).  Lynwood Rush, MD 02/03/2024 4:46 PM [facility] Internal [facility]

## 2024-02-03 NOTE — Assessment & Plan Note (Addendum)
 Also for iron  lab f/u today, pt asymptomatic

## 2024-02-03 NOTE — Patient Instructions (Addendum)

## 2024-02-03 NOTE — Assessment & Plan Note (Signed)
 Lab Results  Component Value Date   HGBA1C 6.3 10/22/2023   Stable, pt to continue current medical treatment  - diet, wt control

## 2024-02-03 NOTE — Assessment & Plan Note (Signed)
 Last vitamin D  Lab Results  Component Value Date   VD25OH 44.52 10/22/2023   Stable, cont oral replacement

## 2024-02-03 NOTE — Assessment & Plan Note (Signed)
 Lab Results  Component Value Date   LDLCALC 74 10/22/2023   uncontrolled, pt to continue current statin lipitor 20 mg and lower chol diet, declines change for now

## 2024-02-04 ENCOUNTER — Ambulatory Visit: Payer: Self-pay | Admitting: Internal Medicine

## 2024-02-04 ENCOUNTER — Other Ambulatory Visit: Payer: Self-pay | Admitting: Internal Medicine

## 2024-02-04 DIAGNOSIS — N1831 Chronic kidney disease, stage 3a: Secondary | ICD-10-CM

## 2024-02-04 LAB — VITAMIN D 25 HYDROXY (VIT D DEFICIENCY, FRACTURES): VITD: 48.13 ng/mL (ref 30.00–100.00)

## 2024-02-04 LAB — FERRITIN: Ferritin: 15.3 ng/mL (ref 10.0–291.0)

## 2024-02-04 LAB — TSH: TSH: 1.32 u[IU]/mL (ref 0.35–5.50)

## 2024-02-04 LAB — VITAMIN B12: Vitamin B-12: 810 pg/mL (ref 211–911)

## 2024-02-12 ENCOUNTER — Ambulatory Visit: Payer: Self-pay

## 2024-02-12 MED ORDER — BENZONATATE 100 MG PO CAPS: 100.0000 mg | ORAL_CAPSULE | Freq: Three times a day (TID) | ORAL | 1 refills | Status: DC | PRN

## 2024-02-12 NOTE — Telephone Encounter (Signed)
 Copied from CRM #[id number]. Topic: Clinical - Medication Question >> Feb 12, 2024  2:23 PM Lindsay Doyle wrote: Reason for CRM: patient calling back and stated that she has dry cough and would like a cough medicine sent to her pharmacy on file. Please call [phone] (H)

## 2024-02-12 NOTE — Telephone Encounter (Signed)
 Ok for tessalon  perle - will do erx

## 2024-02-12 NOTE — Telephone Encounter (Signed)
 1st attempt - recording that VM is not set up  Copied from CRM #[id number]. Topic: Clinical - Medication Question >> Feb 12, 2024 11:24 AM Viola F wrote: Patient has dry cough and would like a cough medicine sent to her pharmacy on file. Please call [phone] (H)

## 2024-02-12 NOTE — Telephone Encounter (Signed)
 2nd attempt-no answer. No voicemail setup to leave a message. Will place in call backs.

## 2024-02-12 NOTE — Telephone Encounter (Signed)
 Unable to reach patient for triage x 3 attempts.

## 2024-02-17 ENCOUNTER — Ambulatory Visit: Payer: Self-pay

## 2024-02-17 NOTE — Telephone Encounter (Signed)
 FYI Only or Action Required?: FYI only for provider: appointment scheduled on 11/21.  Patient was last seen in primary care on 02/03/2024 by Norleen Lynwood ORN, MD.  Called Nurse Triage reporting Cough.  Symptoms began a week ago.  Interventions attempted: OTC medications: robitussin .  Symptoms are: gradually worsening.  Triage Disposition: See Physician Within 24 Hours  Patient/caregiver understands and will follow disposition?: Yes  Copied from CRM #[id number]. Topic: Clinical - Red Word Triage >> Feb 17, 2024 11:25 AM Adelita BRAVO wrote: [facility] that prompted transfer to Nurse Triage: Worsening cough, coughing up phlegm. Stated it gets worse at night and she can hear a rattle. Reason for Disposition  [1] Continuous (nonstop) coughing interferes with work or school AND [2] no improvement using cough treatment per Care Advice  Answer Assessment - Initial Assessment Questions Symptoms for about a week - sinus congestion, productive cough, affecting her sleep. Denies SOB, CP or dizziness.  Robitussin- will try tonight, Will work on hydration  Appt with PCP 11/21 to assess 1. ONSET: When did the cough begin?      About a week  2. SEVERITY: How bad is the cough today?      More at night- mild- mod 3. SPUTUM: Describe the color of your sputum (e.g., none, dry cough; clear, white, yellow, green)     White and thick  4. HEMOPTYSIS: Are you coughing up any blood? If Yes, ask: How much? (e.g., flecks, streaks, tablespoons, etc.)     Denies  5. DIFFICULTY BREATHING: Are you having difficulty breathing? If Yes, ask: How bad is it? (e.g., mild, moderate, severe)      denies 6. FEVER: Do you have a fever? If Yes, ask: What is your temperature, how was it measured, and when did it start?     Denies  7. CARDIAC HISTORY: Do you have any history of heart disease? (e.g., heart attack, congestive heart failure)      NSTEMI 8. LUNG HISTORY: Do you have any history of lung disease?   (e.g., pulmonary embolus, asthma, emphysema)     Asthma, ,COPD 9. PE RISK FACTORS: Do you have a history of blood clots? (or: recent major surgery, recent prolonged travel, bedridden)     denies 10. OTHER SYMPTOMS: Do you have any other symptoms? (e.g., runny nose, wheezing, chest pain)       Runny nose, sinus congestion  Protocols used: Cough - Acute Productive-A-AH

## 2024-02-19 ENCOUNTER — Ambulatory Visit: Admitting: Internal Medicine

## 2024-02-19 ENCOUNTER — Ambulatory Visit

## 2024-02-19 ENCOUNTER — Encounter: Payer: Self-pay | Admitting: Internal Medicine

## 2024-02-19 VITALS — BP 120/76 | HR 82 | Temp 98.3°F | Ht 64.0 in | Wt 103.0 lb

## 2024-02-19 DIAGNOSIS — R058 Other specified cough: Secondary | ICD-10-CM

## 2024-02-19 DIAGNOSIS — I517 Cardiomegaly: Secondary | ICD-10-CM | POA: Diagnosis not present

## 2024-02-19 DIAGNOSIS — R739 Hyperglycemia, unspecified: Secondary | ICD-10-CM | POA: Diagnosis not present

## 2024-02-19 DIAGNOSIS — E782 Mixed hyperlipidemia: Secondary | ICD-10-CM

## 2024-02-19 DIAGNOSIS — E559 Vitamin D deficiency, unspecified: Secondary | ICD-10-CM | POA: Diagnosis not present

## 2024-02-19 DIAGNOSIS — I10 Essential (primary) hypertension: Secondary | ICD-10-CM

## 2024-02-19 DIAGNOSIS — I77810 Thoracic aortic ectasia: Secondary | ICD-10-CM | POA: Diagnosis not present

## 2024-02-19 DIAGNOSIS — R0602 Shortness of breath: Secondary | ICD-10-CM | POA: Diagnosis not present

## 2024-02-19 MED ORDER — LEVOFLOXACIN 500 MG PO TABS: 500.0000 mg | ORAL_TABLET | Freq: Every day | ORAL | 0 refills | Status: DC

## 2024-02-19 MED ORDER — HYDROCODONE BIT-HOMATROP MBR 5-1.5 MG/5ML PO SOLN: 5.0000 mL | Freq: Four times a day (QID) | ORAL | 0 refills | Status: AC | PRN

## 2024-02-19 NOTE — Patient Instructions (Signed)
 Please take all new medication as prescribed  - the antibiotic and cough medicine as needed  Please continue all other medications as before, and refills have been done if requested.  Please have the pharmacy call with any other refills you may need.  Please keep your appointments with your specialists as you may have planned  Please go to the XRAY Department in the first floor for the x-ray testing  You will be contacted by phone if any changes need to be made immediately.  Otherwise, you will receive a letter about your results with an explanation, but please check with MyChart first.

## 2024-02-19 NOTE — Assessment & Plan Note (Signed)
 Last vitamin D  Lab Results  Component Value Date   VD25OH 48.13 02/03/2024   Stable, cont oral replacement

## 2024-02-19 NOTE — Assessment & Plan Note (Signed)
 Lab Results  Component Value Date   HGBA1C 6.2 02/03/2024   Stable, pt to continue current medical treatment  - diet, wt control

## 2024-02-19 NOTE — Progress Notes (Signed)
 Patient ID: Lindsay Doyle, female   DOB: 1933/10/25, 89 y.o.   MRN: [id number]        Chief Complaint: follow up prod cough sob,        HPI:  Lindsay Doyle is a 89 y.o. female Here with acute onset mild to mod 2-3 days ST, HA, general weakness and malaise, with prod cough greenish sputum, but Pt denies chest pain, increased sob or doe, wheezing, orthopnea, PND, increased LE swelling, palpitations, dizziness or syncope.   Pt denies polydipsia, polyuria, or new focal neuro s/s.    Pt denies wt loss, night sweats, loss of appetite, or other constitutional symptoms         Wt Readings from Last 3 Encounters:  02/19/24 103 lb (46.7 kg)  02/03/24 102 lb (46.3 kg)  02/03/24 101 lb 9.6 oz (46.1 kg)   BP Readings from Last 3 Encounters:  02/19/24 120/76  02/03/24 120/76  02/03/24 120/80         Past Medical History:  Diagnosis Date   ANEMIA-IRON  DEFICIENCY    presumed GI bleed 08/2012- anticoag stopped   Atrial fibrillation (HCC)    a. Apixaban  started 03/2012 - stopped after admx for GI bleed 6/14   Blood transfusion    a. 05/2010: 3 transfusions for anemia/GIB.   CAD (coronary artery disease)    a. LHC 1/14:  mLAD serial 30 and 60%, oRCA 30%, EF 60%, Afib,    Cataract    COLONIC POLYPS, HX OF 03/09/2007   COPD 03/09/2007   ? pt is unsure   GERD (gastroesophageal reflux disease)    GI bleed    thought to be due to AVMs   Hx of echocardiogram    a. echo 2/11:  EF 55-60%, mild AI, mild RAE, mild to mod TR;   b.  a. Echo 2/14:  Mild focal basal and mild LVH, EF 60-65%, mild AI, mild RAE, trivia effusion   HYPERGLYCEMIA 07/02/2007   Hyperlipidemia    Hypertension 11/12/2006   LEUKOPENIA, MILD 05/30/2008   Mitral valve prolapse    Remotely - last echo 2011 did not show this   NSTEMI (non-ST elevated myocardial infarction) (HCC) 03/2012   a. Type II in setting of AF with RVR 03/2012   OSTEOARTHRITIS 02/19/2009   Spinal   OSTEOPOROSIS 11/12/2006   Positive H. pylori test 10/1996   PSVT     a. Remotely, in setting of anemia.   Spinal stenosis of lumbar region 07/16/2012   Transfusion history    7/15    Past Surgical History:  Procedure Laterality Date   ABDOMINAL HYSTERECTOMY     CARDIAC CATHETERIZATION  1992   S/P in 1992 at [facility] in [place]  [facility]. This was negative for any coronary artery disease   carotid duplex  08/29/2003   CATARACT EXTRACTION     ceasarean     ENTEROSCOPY N/A 10/10/2013   Procedure: ENTEROSCOPY;  Surgeon: Lamar JONETTA Aho, MD;  Location: [facility];  Service: Endoscopy;  Laterality: N/A;   ENTEROSCOPY N/A 12/28/2014   Procedure: ENTEROSCOPY;  Surgeon: Lamar JONETTA Aho, MD;  Location: [facility];  Service: Endoscopy;  Laterality: N/A;   ENTEROSCOPY N/A 01/20/2016   Procedure: ENTEROSCOPY;  Surgeon: Elspeth Deward Naval, MD;  Location: [facility] ENDOSCOPY;  Service: Gastroenterology;  Laterality: N/A;   HOT HEMOSTASIS N/A 12/28/2014   Procedure: HOT HEMOSTASIS (ARGON PLASMA COAGULATION/BICAP);  Surgeon: Lamar JONETTA Aho, MD;  Location: [facility];  Service: Endoscopy;  Laterality:  N/A;   LEFT HEART CATHETERIZATION WITH CORONARY ANGIOGRAM N/A 04/23/2012   Procedure: LEFT HEART CATHETERIZATION WITH CORONARY ANGIOGRAM;  Surgeon: Ezra GORMAN Shuck, MD;  Location: [facility] CATH LAB;  Service: Cardiovascular;  Laterality: N/A;   TONSILLECTOMY     TUBAL LIGATION      reports that she has never smoked. She has never used smokeless tobacco. She reports that she does not drink alcohol and does not use drugs. family history includes Colon cancer in her brother; Prostate cancer in her brother; Sarcoidosis in her sister and another family member; Stomach cancer in her mother. Allergies  Allergen Reactions   Doxycycline Diarrhea    Possible diarrhea   Clarithromycin  Other (See Comments)    diarrhea   Metronidazole  Other (See Comments)    Pt had difficulty swallowing this and says it was horrible to take. No allergic reaction.   Current Outpatient  Medications on File Prior to Visit  Medication Sig Dispense Refill   acetaminophen  (TYLENOL ) 500 MG tablet Take 2 tablets (1,000 mg total) by mouth 2 (two) times daily. 60 tablet 11   atorvastatin  (LIPITOR) 20 MG tablet Take 1 tablet (20 mg total) by mouth daily. 90 tablet 3   benzonatate  (TESSALON ) 100 MG capsule Take 1 capsule (100 mg total) by mouth 3 (three) times daily as needed for cough. 40 capsule 1   metoprolol  succinate (TOPROL -XL) 50 MG 24 hr tablet TAKE 1 AND 1/2 TABLETS (75 MG) BY MOUTH EVERY DAY 135 tablet 3   Multiple Vitamin (MULTIVITAMIN WITH MINERALS) TABS tablet Take 1 tablet by mouth daily. Centrum Silver     nitroGLYCERIN  (NITROSTAT ) 0.4 MG SL tablet PLACE 1 TABLET UNDER THE TONGUE EVERY 5 MINUTES AS NEEDED FOR CHEST PAIN. 25 tablet 2   No current facility-administered medications on file prior to visit.        ROS:  All others reviewed and negative.  Objective        PE:  BP 120/76 (BP Location: Right Arm, Patient Position: Sitting, Cuff Size: Normal)   Pulse 82   Temp 98.3 F (36.8 C) (Oral)   Ht 5' 4 (1.626 m)   Wt 103 lb (46.7 kg)   SpO2 99%   BMI 17.68 kg/m                 Constitutional: Pt appears mild ill               HENT: Head: NCAT.                Right Ear: External ear normal.                 Left Ear: External ear normal. Bilat tm's with mild erythema.  Max sinus areas non tender.  Pharynx with mild erythema, no exudate               Eyes: . Pupils are equal, round, and reactive to light. Conjunctivae and EOM are normal               Nose: without d/c or deformity               Neck: Neck supple. Gross normal ROM               Cardiovascular: Normal rate and regular rhythm.                 Pulmonary/Chest: Effort normal and breath sounds decreased without rales or wheezing.  Neurological: Pt is alert. At baseline orientation, motor grossly intact               Skin: Skin is warm. No rashes, no other new lesions, LE  edema - none               Psychiatric: Pt behavior is normal without agitation   Micro: none  Cardiac tracings I have personally interpreted today:  none  Pertinent Radiological findings (summarize): none   Lab Results  Component Value Date   WBC 5.8 02/03/2024   HGB 13.6 02/03/2024   HCT 40.3 02/03/2024   PLT 211.0 02/03/2024   GLUCOSE 103 (H) 02/03/2024   CHOL 199 02/03/2024   TRIG 87.0 02/03/2024   HDL 94.90 02/03/2024   LDLDIRECT 199.0 09/17/2020   LDLCALC 87 02/03/2024   ALT 16 02/03/2024   AST 26 02/03/2024   NA 140 02/03/2024   K 4.0 02/03/2024   CL 102 02/03/2024   CREATININE 1.27 (H) 02/03/2024   BUN 18 02/03/2024   CO2 29 02/03/2024   TSH 1.32 02/03/2024   INR 1.06 01/20/2016   HGBA1C 6.2 02/03/2024   Assessment/Plan:  Lindsay Doyle is a 89 y.o. Black or African American [2] female with  has a past medical history of ANEMIA-IRON  DEFICIENCY, Atrial fibrillation (HCC), Blood transfusion, CAD (coronary artery disease), Cataract, COLONIC POLYPS, HX OF (03/09/2007), COPD (03/09/2007), GERD (gastroesophageal reflux disease), GI bleed, echocardiogram, HYPERGLYCEMIA (07/02/2007), Hyperlipidemia, Hypertension (11/12/2006), LEUKOPENIA, MILD (05/30/2008), Mitral valve prolapse, NSTEMI (non-ST elevated myocardial infarction) (HCC) (03/2012), OSTEOARTHRITIS (02/19/2009), OSTEOPOROSIS (11/12/2006), Positive H. pylori test (10/1996), PSVT, Spinal stenosis of lumbar region (07/16/2012), and Transfusion history.  Vitamin D  deficiency Last vitamin D  Lab Results  Component Value Date   VD25OH 48.13 02/03/2024   Stable, cont oral replacement   Hyperlipidemia Lab Results  Component Value Date   LDLCALC 87 02/03/2024   Stable, pt to continue current statin  - diet, wt control   Hyperglycemia Lab Results  Component Value Date   HGBA1C 6.2 02/03/2024   Stable, pt to continue current medical treatment  - diet, wt control   Essential hypertension BP Readings from Last 3  Encounters:  02/19/24 120/76  02/03/24 120/76  02/03/24 120/80   Stable, pt to continue medical treatment toprol  xl 75 qd   Productive cough Mild to mod, can't r/o pna vs bronchitis, for antibx course levaquin  500 every day course, hycodan prn, and CXR today,  to f/u any worsening symptoms or concerns  Followup: Return if symptoms worsen or fail to improve.  Lynwood Rush, MD 02/19/2024 6:53 PM [facility] [place] [facility] - [facility] Internal Medicine

## 2024-02-19 NOTE — Assessment & Plan Note (Signed)
 BP Readings from Last 3 Encounters:  02/19/24 120/76  02/03/24 120/76  02/03/24 120/80   Stable, pt to continue medical treatment toprol  xl 75 qd

## 2024-02-19 NOTE — Assessment & Plan Note (Signed)
 Mild to mod, can't r/o pna vs bronchitis, for antibx course levaquin  500 every day course, hycodan prn, and CXR today,  to f/u any worsening symptoms or concerns

## 2024-02-19 NOTE — Assessment & Plan Note (Signed)
 Lab Results  Component Value Date   LDLCALC 87 02/03/2024   Stable, pt to continue current statin  - diet, wt control

## 2024-02-22 ENCOUNTER — Telehealth: Payer: Self-pay

## 2024-02-22 NOTE — Telephone Encounter (Signed)
 Copied from CRM #[id number]. Topic: Clinical - Lab/Test Results >> Feb 22, 2024  4:16 PM Thersia BROCKS wrote: Reason for CRM: Patient called in regarding imaging results, informed her once results are in and provider reads them someone will call her for the results

## 2024-02-24 NOTE — Telephone Encounter (Signed)
 I agree, no new orders for now, and await results.   Thanks

## 2024-02-26 ENCOUNTER — Ambulatory Visit: Payer: Self-pay | Admitting: Internal Medicine

## 2024-04-14 ENCOUNTER — Other Ambulatory Visit: Payer: Self-pay | Admitting: Internal Medicine

## 2024-04-14 MED ORDER — METOPROLOL SUCCINATE ER 50 MG PO TB24: ORAL_TABLET | ORAL | 3 refills | Status: DC

## 2024-04-14 NOTE — Telephone Encounter (Signed)
 Copied from CRM #[id number]. Topic: Clinical - Medication Refill >> Apr 14, 2024  2:03 PM Mercedes MATSU wrote: Medication: metoprolol  succinate (TOPROL -XL) 50 MG 24 hr tablet Patient states she needs this medication TODAY, that she is completely out.  Has the patient contacted their pharmacy? Yes (Agent: If no, request that the patient contact the pharmacy for the refill. If patient does not wish to contact the pharmacy document the reason why and proceed with request.) (Agent: If yes, when and what did the pharmacy advise?)  This is the patient's preferred pharmacy:  [facility] [address] [address], [place] - [address] [place] [address] Phone: [phone] Fax: [phone]  Is this the correct pharmacy for this prescription? Yes If no, delete pharmacy and type the correct one.   Has the prescription been filled recently? Yes  Is the patient out of the medication? Yes  Has the patient been seen for an appointment in the last year OR does the patient have an upcoming appointment? Yes  Can we respond through MyChart? Yes  Agent: Please be advised that Rx refills may take up to 3 business days. We ask that you follow-up with your pharmacy.

## 2024-04-20 ENCOUNTER — Ambulatory Visit: Admitting: Internal Medicine

## 2024-04-20 ENCOUNTER — Encounter: Payer: Self-pay | Admitting: Internal Medicine

## 2024-04-20 VITALS — BP 122/78 | HR 82 | Temp 97.6°F | Ht 64.0 in | Wt 100.0 lb

## 2024-04-20 DIAGNOSIS — R739 Hyperglycemia, unspecified: Secondary | ICD-10-CM

## 2024-04-20 DIAGNOSIS — E559 Vitamin D deficiency, unspecified: Secondary | ICD-10-CM | POA: Diagnosis not present

## 2024-04-20 DIAGNOSIS — I10 Essential (primary) hypertension: Secondary | ICD-10-CM | POA: Diagnosis not present

## 2024-04-20 DIAGNOSIS — E782 Mixed hyperlipidemia: Secondary | ICD-10-CM

## 2024-04-20 MED ORDER — METOPROLOL SUCCINATE ER 50 MG PO TB24: ORAL_TABLET | ORAL | 3 refills | Status: DC

## 2024-04-20 MED ORDER — ATORVASTATIN CALCIUM 20 MG PO TABS: 20.0000 mg | ORAL_TABLET | Freq: Every day | ORAL | 3 refills | Status: AC

## 2024-04-20 NOTE — Patient Instructions (Addendum)
 Please continue all other medications as before, and refills have been done   Please have the pharmacy call with any other refills you may need.  Please continue your efforts at being more active, low cholesterol diet, and weight control.  Please keep your appointments with your specialists as you may have planned  Please make an Appointment to return in 6 months, or sooner if needed

## 2024-04-20 NOTE — Progress Notes (Unsigned)
 Patient ID: Lindsay Doyle, female   DOB: Feb 13, 1934, 89 y.o.   MRN: [id number]        Chief Complaint: follow up HTN, HLD and hyperglycemia , low vit d       HPI:  Lindsay Doyle is a 89 y.o. female here overall doing ok, Pt denies chest pain, increased sob or doe, wheezing, orthopnea, PND, increased LE swelling, palpitations, dizziness or syncope.   Pt denies polydipsia, polyuria, or new focal neuro s/s.    Pt denies fever, wt loss, night sweats, loss of appetite, or other constitutional symptoms  Doing 30 min exercise program on the TV       Wt Readings from Last 3 Encounters:  04/20/24 100 lb (45.4 kg)  02/19/24 103 lb (46.7 kg)  02/03/24 102 lb (46.3 kg)   BP Readings from Last 3 Encounters:  04/20/24 122/78  02/19/24 120/76  02/03/24 120/76         Past Medical History:  Diagnosis Date   ANEMIA-IRON  DEFICIENCY    presumed GI bleed 08/2012- anticoag stopped   Atrial fibrillation (HCC)    a. Apixaban  started 03/2012 - stopped after admx for GI bleed 6/14   Blood transfusion    a. 05/2010: 3 transfusions for anemia/GIB.   CAD (coronary artery disease)    a. LHC 1/14:  mLAD serial 30 and 60%, oRCA 30%, EF 60%, Afib,    Cataract    COLONIC POLYPS, HX OF 03/09/2007   COPD 03/09/2007   ? pt is unsure   GERD (gastroesophageal reflux disease)    GI bleed    thought to be due to AVMs   Hx of echocardiogram    a. echo 2/11:  EF 55-60%, mild AI, mild RAE, mild to mod TR;   b.  a. Echo 2/14:  Mild focal basal and mild LVH, EF 60-65%, mild AI, mild RAE, trivia effusion   HYPERGLYCEMIA 07/02/2007   Hyperlipidemia    Hypertension 11/12/2006   LEUKOPENIA, MILD 05/30/2008   Mitral valve prolapse    Remotely - last echo 2011 did not show this   NSTEMI (non-ST elevated myocardial infarction) (HCC) 03/2012   a. Type II in setting of AF with RVR 03/2012   OSTEOARTHRITIS 02/19/2009   Spinal   OSTEOPOROSIS 11/12/2006   Positive H. pylori test 10/1996   PSVT    a. Remotely, in setting of anemia.    Spinal stenosis of lumbar region 07/16/2012   Transfusion history    7/15    Past Surgical History:  Procedure Laterality Date   ABDOMINAL HYSTERECTOMY     CARDIAC CATHETERIZATION  1992   S/P in 1992 at [facility]. This was negative for any coronary artery disease   carotid duplex  08/29/2003   CATARACT EXTRACTION     ceasarean     ENTEROSCOPY N/A 10/10/2013   Procedure: ENTEROSCOPY;  Surgeon: Lamar JONETTA Aho, MD;  Location: [facility];  Service: Endoscopy;  Laterality: N/A;   ENTEROSCOPY N/A 12/28/2014   Procedure: ENTEROSCOPY;  Surgeon: Lamar JONETTA Aho, MD;  Location: [facility];  Service: Endoscopy;  Laterality: N/A;   ENTEROSCOPY N/A 01/20/2016   Procedure: ENTEROSCOPY;  Surgeon: Elspeth Deward Naval, MD;  Location: [facility] ENDOSCOPY;  Service: Gastroenterology;  Laterality: N/A;   HOT HEMOSTASIS N/A 12/28/2014   Procedure: HOT HEMOSTASIS (ARGON PLASMA COAGULATION/BICAP);  Surgeon: Lamar JONETTA Aho, MD;  Location: [facility];  Service: Endoscopy;  Laterality: N/A;   LEFT HEART CATHETERIZATION WITH  CORONARY ANGIOGRAM N/A 04/23/2012   Procedure: LEFT HEART CATHETERIZATION WITH CORONARY ANGIOGRAM;  Surgeon: Ezra GORMAN Shuck, MD;  Location: [facility] CATH LAB;  Service: Cardiovascular;  Laterality: N/A;   TONSILLECTOMY     TUBAL LIGATION      reports that she has never smoked. She has never used smokeless tobacco. She reports that she does not drink alcohol and does not use drugs. family history includes Colon cancer in her brother; Prostate cancer in her brother; Sarcoidosis in her sister and another family member; Stomach cancer in her mother. Allergies[1] Medications Ordered Prior to Encounter[2]      ROS:  All others reviewed and negative.  Objective        PE:  BP 122/78 (BP Location: Right Arm, Patient Position: Sitting, Cuff Size: Normal)   Pulse 82   Temp 97.6 F (36.4 C) (Oral)   Ht 5' 4 (1.626 m)   Wt 100 lb (45.4 kg)   SpO2 97%   BMI 17.16  kg/m                 Constitutional: Pt appears in NAD               HENT: Head: NCAT.                Right Ear: External ear normal.                 Left Ear: External ear normal.                Eyes: . Pupils are equal, round, and reactive to light. Conjunctivae and EOM are normal               Nose: without d/c or deformity               Neck: Neck supple. Gross normal ROM               Cardiovascular: Normal rate and regular rhythm.                 Pulmonary/Chest: Effort normal and breath sounds without rales or wheezing.                Abd:  Soft, NT, ND, + BS, no organomegaly               Neurological: Pt is alert. At baseline orientation, motor grossly intact               Skin: Skin is warm. No rashes, no other new lesions, LE edema - none               Psychiatric: Pt behavior is normal without agitation   Micro: none  Cardiac tracings I have personally interpreted today:  none  Pertinent Radiological findings (summarize): none   Lab Results  Component Value Date   WBC 5.8 02/03/2024   HGB 13.6 02/03/2024   HCT 40.3 02/03/2024   PLT 211.0 02/03/2024   GLUCOSE 103 (H) 02/03/2024   CHOL 199 02/03/2024   TRIG 87.0 02/03/2024   HDL 94.90 02/03/2024   LDLDIRECT 199.0 09/17/2020   LDLCALC 87 02/03/2024   ALT 16 02/03/2024   AST 26 02/03/2024   NA 140 02/03/2024   K 4.0 02/03/2024   CL 102 02/03/2024   CREATININE 1.27 (H) 02/03/2024   BUN 18 02/03/2024   CO2 29 02/03/2024   TSH 1.32 02/03/2024   INR 1.06 01/20/2016   HGBA1C 6.2 02/03/2024  Assessment/Plan:  Lindsay Doyle is a 89 y.o. Black or African American [2] female with  has a past medical history of ANEMIA-IRON  DEFICIENCY, Atrial fibrillation (HCC), Blood transfusion, CAD (coronary artery disease), Cataract, COLONIC POLYPS, HX OF (03/09/2007), COPD (03/09/2007), GERD (gastroesophageal reflux disease), GI bleed, echocardiogram, HYPERGLYCEMIA (07/02/2007), Hyperlipidemia, Hypertension (11/12/2006), LEUKOPENIA,  MILD (05/30/2008), Mitral valve prolapse, NSTEMI (non-ST elevated myocardial infarction) (HCC) (03/2012), OSTEOARTHRITIS (02/19/2009), OSTEOPOROSIS (11/12/2006), Positive H. pylori test (10/1996), PSVT, Spinal stenosis of lumbar region (07/16/2012), and Transfusion history.  Essential hypertension BP Readings from Last 3 Encounters:  04/20/24 122/78  02/19/24 120/76  02/03/24 120/76   Stable, pt to continue medical treatment toprol  xl 75 qd   Hyperglycemia Lab Results  Component Value Date   HGBA1C 6.2 02/03/2024   Stable, pt to continue current medical treatment  - diet, wt control   Hyperlipidemia Lab Results  Component Value Date   LDLCALC 87 02/03/2024   Stable, pt to continue current statin lipitor 20 mg qd   Vitamin D  deficiency Last vitamin D  Lab Results  Component Value Date   VD25OH 48.13 02/03/2024   Stable, cont oral replacement  Followup: Return in about 6 months (around 10/18/2024).  Lynwood Rush, MD 04/21/2024 5:43 AM [facility]  [place] [facility] - [facility] Internal Medicine     [1]  Allergies Allergen Reactions   Doxycycline Diarrhea    Possible diarrhea   Clarithromycin  Other (See Comments)    diarrhea   Metronidazole  Other (See Comments)    Pt had difficulty swallowing this and says it was horrible to take. No allergic reaction.  [2]  Current Outpatient Medications on File Prior to Visit  Medication Sig Dispense Refill   acetaminophen  (TYLENOL ) 500 MG tablet Take 2 tablets (1,000 mg total) by mouth 2 (two) times daily. 60 tablet 11   Multiple Vitamin (MULTIVITAMIN WITH MINERALS) TABS tablet Take 1 tablet by mouth daily. Centrum Silver     nitroGLYCERIN  (NITROSTAT ) 0.4 MG SL tablet PLACE 1 TABLET UNDER THE TONGUE EVERY 5 MINUTES AS NEEDED FOR CHEST PAIN. 25 tablet 2   No current facility-administered medications on file prior to visit.

## 2024-04-21 ENCOUNTER — Encounter: Payer: Self-pay | Admitting: Internal Medicine

## 2024-04-21 NOTE — Assessment & Plan Note (Signed)
 Lab Results  Component Value Date   HGBA1C 6.2 02/03/2024   Stable, pt to continue current medical treatment  - diet, wt control

## 2024-04-21 NOTE — Assessment & Plan Note (Signed)
 Lab Results  Component Value Date   LDLCALC 87 02/03/2024   Stable, pt to continue current statin lipitor 20 mg qd

## 2024-04-21 NOTE — Assessment & Plan Note (Signed)
 BP Readings from Last 3 Encounters:  04/20/24 122/78  02/19/24 120/76  02/03/24 120/76   Stable, pt to continue medical treatment toprol  xl 75 qd

## 2024-04-21 NOTE — Assessment & Plan Note (Signed)
 Last vitamin D  Lab Results  Component Value Date   VD25OH 48.13 02/03/2024   Stable, cont oral replacement

## 2024-04-26 ENCOUNTER — Ambulatory Visit: Admitting: Internal Medicine

## 2024-04-28 ENCOUNTER — Ambulatory Visit: Admitting: Cardiovascular Disease

## 2024-05-02 ENCOUNTER — Telehealth: Payer: Self-pay | Admitting: Cardiovascular Disease

## 2024-05-06 ENCOUNTER — Telehealth: Payer: Self-pay | Admitting: Cardiovascular Disease

## 2024-05-06 MED ORDER — METOPROLOL SUCCINATE ER 50 MG PO TB24: ORAL_TABLET | ORAL | 3 refills | Status: AC

## 2024-05-06 NOTE — Telephone Encounter (Signed)
" °*  STAT* If patient is at the pharmacy, call can be transferred to refill team.   1. Which medications need to be refilled? (please list name of each medication and dose if known)   Take 1 tablet (20 mg total) by mouth daily.    metoprolol  succinate (TOPROL -XL) 50 MG 24 hr tablet   2. Which pharmacy/location (including street and city if local pharmacy) is medication to be sent to? [facility] [address] [place], [place] [facility] [address] [facility] Phone: [phone]  Fax: [phone]      3. Do they need a 30 day or 90 day supply? 90 Pharmacy told pt they did not have any refills. Please re-send.  "

## 2024-05-06 NOTE — Telephone Encounter (Signed)
 Rx resent to pharmacy

## 2024-05-17 ENCOUNTER — Ambulatory Visit: Admitting: Physician Assistant

## 2025-02-07 ENCOUNTER — Ambulatory Visit
